# Patient Record
Sex: Male | Born: 1960
Health system: Southern US, Community
[De-identification: ages and names within clinical notes are randomized; demographics above are authoritative.]

## PROBLEM LIST (undated history)

## (undated) DIAGNOSIS — M545 Low back pain, unspecified: Secondary | ICD-10-CM

## (undated) DIAGNOSIS — R160 Hepatomegaly, not elsewhere classified: Secondary | ICD-10-CM

## (undated) DIAGNOSIS — Z8619 Personal history of other infectious and parasitic diseases: Secondary | ICD-10-CM

## (undated) DIAGNOSIS — K859 Acute pancreatitis without necrosis or infection, unspecified: Secondary | ICD-10-CM

## (undated) DIAGNOSIS — F4310 Post-traumatic stress disorder, unspecified: Secondary | ICD-10-CM

## (undated) DIAGNOSIS — IMO0002 Reserved for concepts with insufficient information to code with codable children: Secondary | ICD-10-CM

## (undated) DIAGNOSIS — G8929 Other chronic pain: Secondary | ICD-10-CM

## (undated) DIAGNOSIS — S32409A Unspecified fracture of unspecified acetabulum, initial encounter for closed fracture: Secondary | ICD-10-CM

## (undated) DIAGNOSIS — K219 Gastro-esophageal reflux disease without esophagitis: Secondary | ICD-10-CM

## (undated) HISTORY — PX: BACK SURGERY: SHX140

## (undated) HISTORY — DX: Low back pain: M54.5

## (undated) HISTORY — DX: Gastro-esophageal reflux disease without esophagitis: K21.9

## (undated) HISTORY — PX: CHOLECYSTECTOMY: SHX55

## (undated) HISTORY — PX: HERNIA REPAIR: SHX51

## (undated) HISTORY — DX: Low back pain, unspecified: M54.50

## (undated) HISTORY — PX: STERNUM FRACTURE SURGERY: SHX790

## (undated) HISTORY — DX: Other chronic pain: G89.29

## (undated) HISTORY — DX: Hepatomegaly, not elsewhere classified: R16.0

## (undated) HISTORY — DX: Acute pancreatitis without necrosis or infection, unspecified: K85.90

## (undated) HISTORY — PX: LAMINECTOMY: SHX219

---

## 1999-07-13 ENCOUNTER — Emergency Department (HOSPITAL_COMMUNITY): Admission: EM | Admit: 1999-07-13 | Discharge: 1999-07-13 | Payer: Self-pay

## 1999-12-28 ENCOUNTER — Emergency Department (HOSPITAL_COMMUNITY): Admission: EM | Admit: 1999-12-28 | Discharge: 1999-12-29 | Payer: Self-pay | Admitting: Emergency Medicine

## 1999-12-28 ENCOUNTER — Encounter: Payer: Self-pay | Admitting: Emergency Medicine

## 2000-10-23 ENCOUNTER — Encounter: Admission: RE | Admit: 2000-10-23 | Discharge: 2001-01-21 | Payer: Self-pay | Admitting: Anesthesiology

## 2001-01-25 ENCOUNTER — Encounter: Admission: RE | Admit: 2001-01-25 | Discharge: 2001-03-24 | Payer: Self-pay | Admitting: Anesthesiology

## 2001-04-20 ENCOUNTER — Encounter: Admission: RE | Admit: 2001-04-20 | Discharge: 2001-04-24 | Payer: Self-pay | Admitting: Anesthesiology

## 2004-01-26 ENCOUNTER — Ambulatory Visit (HOSPITAL_COMMUNITY): Admission: RE | Admit: 2004-01-26 | Discharge: 2004-01-26 | Payer: Self-pay | Admitting: Anesthesiology

## 2007-04-05 ENCOUNTER — Ambulatory Visit: Payer: Self-pay | Admitting: Internal Medicine

## 2007-04-05 ENCOUNTER — Inpatient Hospital Stay (HOSPITAL_COMMUNITY): Admission: EM | Admit: 2007-04-05 | Discharge: 2007-05-01 | Payer: Self-pay | Admitting: Emergency Medicine

## 2007-04-05 ENCOUNTER — Encounter (INDEPENDENT_AMBULATORY_CARE_PROVIDER_SITE_OTHER): Payer: Self-pay | Admitting: *Deleted

## 2007-04-06 ENCOUNTER — Encounter (INDEPENDENT_AMBULATORY_CARE_PROVIDER_SITE_OTHER): Payer: Self-pay | Admitting: *Deleted

## 2007-04-08 ENCOUNTER — Encounter (INDEPENDENT_AMBULATORY_CARE_PROVIDER_SITE_OTHER): Payer: Self-pay | Admitting: *Deleted

## 2007-04-21 ENCOUNTER — Ambulatory Visit: Payer: Self-pay | Admitting: Internal Medicine

## 2007-05-19 ENCOUNTER — Encounter (INDEPENDENT_AMBULATORY_CARE_PROVIDER_SITE_OTHER): Payer: Self-pay | Admitting: *Deleted

## 2007-05-19 ENCOUNTER — Encounter: Admission: RE | Admit: 2007-05-19 | Discharge: 2007-05-19 | Payer: Self-pay | Admitting: Gastroenterology

## 2007-07-07 ENCOUNTER — Encounter (INDEPENDENT_AMBULATORY_CARE_PROVIDER_SITE_OTHER): Payer: Self-pay | Admitting: *Deleted

## 2007-08-06 ENCOUNTER — Encounter (INDEPENDENT_AMBULATORY_CARE_PROVIDER_SITE_OTHER): Payer: Self-pay | Admitting: *Deleted

## 2007-08-06 ENCOUNTER — Encounter: Admission: RE | Admit: 2007-08-06 | Discharge: 2007-08-06 | Payer: Self-pay | Admitting: Gastroenterology

## 2007-08-13 ENCOUNTER — Emergency Department (HOSPITAL_COMMUNITY): Admission: EM | Admit: 2007-08-13 | Discharge: 2007-08-13 | Payer: Self-pay | Admitting: *Deleted

## 2008-05-12 ENCOUNTER — Encounter (INDEPENDENT_AMBULATORY_CARE_PROVIDER_SITE_OTHER): Payer: Self-pay | Admitting: Surgery

## 2008-05-12 ENCOUNTER — Encounter (INDEPENDENT_AMBULATORY_CARE_PROVIDER_SITE_OTHER): Payer: Self-pay | Admitting: *Deleted

## 2008-05-12 ENCOUNTER — Ambulatory Visit (HOSPITAL_COMMUNITY): Admission: RE | Admit: 2008-05-12 | Discharge: 2008-05-13 | Payer: Self-pay | Admitting: Surgery

## 2009-02-08 ENCOUNTER — Ambulatory Visit: Payer: Self-pay | Admitting: Family Medicine

## 2009-02-08 DIAGNOSIS — M545 Low back pain, unspecified: Secondary | ICD-10-CM | POA: Insufficient documentation

## 2009-02-08 DIAGNOSIS — E1159 Type 2 diabetes mellitus with other circulatory complications: Secondary | ICD-10-CM

## 2009-02-08 DIAGNOSIS — R5383 Other fatigue: Secondary | ICD-10-CM

## 2009-02-08 DIAGNOSIS — R5381 Other malaise: Secondary | ICD-10-CM

## 2009-02-08 DIAGNOSIS — R609 Edema, unspecified: Secondary | ICD-10-CM

## 2009-02-08 DIAGNOSIS — I10 Essential (primary) hypertension: Secondary | ICD-10-CM

## 2009-02-08 DIAGNOSIS — R160 Hepatomegaly, not elsewhere classified: Secondary | ICD-10-CM | POA: Insufficient documentation

## 2009-02-08 DIAGNOSIS — K219 Gastro-esophageal reflux disease without esophagitis: Secondary | ICD-10-CM | POA: Insufficient documentation

## 2009-02-09 ENCOUNTER — Ambulatory Visit: Payer: Self-pay

## 2009-02-09 ENCOUNTER — Encounter: Payer: Self-pay | Admitting: Family Medicine

## 2009-02-09 LAB — CONVERTED CEMR LAB
ALT: 35 units/L (ref 0–53)
Basophils Absolute: 0 10*3/uL (ref 0.0–0.1)
Bilirubin, Direct: 0.4 mg/dL — ABNORMAL HIGH (ref 0.0–0.3)
Chloride: 98 meq/L (ref 96–112)
Creatinine, Ser: 0.6 mg/dL (ref 0.4–1.5)
Eosinophils Absolute: 0 10*3/uL (ref 0.0–0.7)
Eosinophils Relative: 0.4 % (ref 0.0–5.0)
GFR calc non Af Amer: 153.64 mL/min (ref >60)
Glucose, Bld: 172 mg/dL — ABNORMAL HIGH (ref 70–99)
HDL: 62.7 mg/dL (ref >39.00)
Hemoglobin: 14.3 g/dL (ref 13.0–17.0)
Lymphocytes Relative: 20.8 % (ref 12.0–46.0)
Lymphs Abs: 1.2 10*3/uL (ref 0.7–4.0)
Platelets: 154 10*3/uL (ref 150.0–400.0)
Total Bilirubin: 1.1 mg/dL (ref 0.3–1.2)
Triglycerides: 96 mg/dL (ref 0.0–149.0)

## 2009-02-12 ENCOUNTER — Telehealth: Payer: Self-pay | Admitting: Family Medicine

## 2009-02-22 ENCOUNTER — Ambulatory Visit: Payer: Self-pay | Admitting: Family Medicine

## 2009-02-22 DIAGNOSIS — M810 Age-related osteoporosis without current pathological fracture: Secondary | ICD-10-CM | POA: Insufficient documentation

## 2009-03-21 ENCOUNTER — Encounter: Payer: Self-pay | Admitting: Family Medicine

## 2009-05-31 ENCOUNTER — Ambulatory Visit: Payer: Self-pay | Admitting: Family Medicine

## 2009-07-04 ENCOUNTER — Ambulatory Visit: Payer: Self-pay | Admitting: Family Medicine

## 2009-07-05 LAB — CONVERTED CEMR LAB
BUN: 5 mg/dL — ABNORMAL LOW (ref 6–23)
Calcium: 8.9 mg/dL (ref 8.4–10.5)
Hgb A1c MFr Bld: 6.9 % — ABNORMAL HIGH (ref 4.6–6.5)
Potassium: 3.9 meq/L (ref 3.5–5.1)
Testosterone: 207.6 ng/dL — ABNORMAL LOW (ref 350.00–890.00)
Vitamin B-12: 478 pg/mL (ref 211–911)

## 2009-08-29 ENCOUNTER — Ambulatory Visit: Payer: Self-pay | Admitting: Family Medicine

## 2009-08-29 DIAGNOSIS — E291 Testicular hypofunction: Secondary | ICD-10-CM

## 2009-08-29 DIAGNOSIS — E559 Vitamin D deficiency, unspecified: Secondary | ICD-10-CM | POA: Insufficient documentation

## 2009-09-14 ENCOUNTER — Encounter (INDEPENDENT_AMBULATORY_CARE_PROVIDER_SITE_OTHER): Payer: Self-pay | Admitting: *Deleted

## 2009-09-14 LAB — CONVERTED CEMR LAB
ALT: 108 units/L — ABNORMAL HIGH (ref 0–53)
Albumin: 3.4 g/dL — ABNORMAL LOW (ref 3.5–5.2)
Alkaline Phosphatase: 373 units/L — ABNORMAL HIGH (ref 39–117)
Basophils Absolute: 0 10*3/uL (ref 0.0–0.1)
Basophils Relative: 0.1 % (ref 0.0–3.0)
Hemoglobin: 16.3 g/dL (ref 13.0–17.0)
INR: 1.2 — ABNORMAL HIGH (ref 0.8–1.0)
MCHC: 32 g/dL (ref 30.0–36.0)
MCV: 109.6 fL — ABNORMAL HIGH (ref 78.0–100.0)
Monocytes Absolute: 1.1 10*3/uL — ABNORMAL HIGH (ref 0.1–1.0)
Monocytes Relative: 11.2 % (ref 3.0–12.0)
RBC: 4.64 M/uL (ref 4.22–5.81)
Total Bilirubin: 1.8 mg/dL — ABNORMAL HIGH (ref 0.3–1.2)

## 2009-09-27 DIAGNOSIS — Z8719 Personal history of other diseases of the digestive system: Secondary | ICD-10-CM | POA: Insufficient documentation

## 2009-10-02 ENCOUNTER — Ambulatory Visit (HOSPITAL_COMMUNITY): Admission: RE | Admit: 2009-10-02 | Discharge: 2009-10-02 | Payer: Self-pay | Admitting: Internal Medicine

## 2009-10-02 ENCOUNTER — Ambulatory Visit: Payer: Self-pay | Admitting: Internal Medicine

## 2009-10-02 DIAGNOSIS — Z862 Personal history of diseases of the blood and blood-forming organs and certain disorders involving the immune mechanism: Secondary | ICD-10-CM | POA: Insufficient documentation

## 2009-10-02 DIAGNOSIS — Z8639 Personal history of other endocrine, nutritional and metabolic disease: Secondary | ICD-10-CM

## 2009-10-02 LAB — CONVERTED CEMR LAB
A-1 Antitrypsin, Ser: 227 mg/dL — ABNORMAL HIGH (ref 83–200)
HCV Ab: NEGATIVE
Hepatitis B Surface Ag: NEGATIVE
INR: 1.2 — ABNORMAL HIGH (ref 0.8–1.0)
Transferrin: 204.3 mg/dL — ABNORMAL LOW (ref 212.0–360.0)

## 2009-10-22 ENCOUNTER — Telehealth (INDEPENDENT_AMBULATORY_CARE_PROVIDER_SITE_OTHER): Payer: Self-pay | Admitting: *Deleted

## 2010-02-06 ENCOUNTER — Telehealth: Payer: Self-pay | Admitting: Family Medicine

## 2010-02-22 ENCOUNTER — Telehealth: Payer: Self-pay | Admitting: Family Medicine

## 2010-02-26 ENCOUNTER — Telehealth: Payer: Self-pay | Admitting: Family Medicine

## 2010-04-26 ENCOUNTER — Telehealth: Payer: Self-pay | Admitting: Family Medicine

## 2010-05-01 ENCOUNTER — Ambulatory Visit: Payer: Self-pay | Admitting: Family Medicine

## 2010-05-01 DIAGNOSIS — R17 Unspecified jaundice: Secondary | ICD-10-CM | POA: Insufficient documentation

## 2010-05-06 LAB — CONVERTED CEMR LAB
AST: 112 units/L — ABNORMAL HIGH (ref 0–37)
Albumin: 3.5 g/dL (ref 3.5–5.2)
BUN: 8 mg/dL (ref 6–23)
Basophils Absolute: 0.3 10*3/uL — ABNORMAL HIGH (ref 0.0–0.1)
CO2: 27 meq/L (ref 19–32)
Creatinine, Ser: 0.5 mg/dL (ref 0.4–1.5)
Glucose, Bld: 95 mg/dL (ref 70–99)
Hemoglobin: 15.1 g/dL (ref 13.0–17.0)
INR: 1.3 — ABNORMAL HIGH (ref 0.8–1.0)
Neutrophils Relative %: 66.3 % (ref 43.0–77.0)
Potassium: 3.3 meq/L — ABNORMAL LOW (ref 3.5–5.1)
Prothrombin Time: 14.1 s — ABNORMAL HIGH (ref 9.7–11.8)
RDW: 15.5 % — ABNORMAL HIGH (ref 11.5–14.6)
Sodium: 139 meq/L (ref 135–145)
Total Bilirubin: 7.6 mg/dL — ABNORMAL HIGH (ref 0.3–1.2)
Total Protein: 7.3 g/dL (ref 6.0–8.3)
WBC: 12.7 10*3/uL — ABNORMAL HIGH (ref 4.5–10.5)

## 2010-05-09 ENCOUNTER — Telehealth: Payer: Self-pay | Admitting: Family Medicine

## 2010-05-13 ENCOUNTER — Telehealth: Payer: Self-pay | Admitting: Family Medicine

## 2010-05-14 ENCOUNTER — Telehealth: Payer: Self-pay | Admitting: Internal Medicine

## 2010-06-12 ENCOUNTER — Ambulatory Visit: Payer: Self-pay | Admitting: Internal Medicine

## 2010-06-12 DIAGNOSIS — K7030 Alcoholic cirrhosis of liver without ascites: Secondary | ICD-10-CM | POA: Insufficient documentation

## 2010-06-12 DIAGNOSIS — K7010 Alcoholic hepatitis without ascites: Secondary | ICD-10-CM

## 2010-06-13 ENCOUNTER — Ambulatory Visit: Payer: Self-pay | Admitting: Internal Medicine

## 2010-06-13 LAB — CONVERTED CEMR LAB
Alkaline Phosphatase: 279 units/L — ABNORMAL HIGH (ref 39–117)
Calcium: 8.6 mg/dL (ref 8.4–10.5)
Chloride: 100 meq/L (ref 96–112)
Eosinophils Absolute: 0.1 10*3/uL (ref 0.0–0.7)
Eosinophils Relative: 1.2 % (ref 0.0–5.0)
Glucose, Bld: 271 mg/dL — ABNORMAL HIGH (ref 70–99)
INR: 1.3 — ABNORMAL HIGH (ref 0.8–1.0)
Lymphocytes Relative: 20.9 % (ref 12.0–46.0)
Lymphs Abs: 1.7 10*3/uL (ref 0.7–4.0)
MCHC: 34.8 g/dL (ref 30.0–36.0)
MCV: 104.4 fL — ABNORMAL HIGH (ref 78.0–100.0)
Monocytes Absolute: 0.6 10*3/uL (ref 0.1–1.0)
Platelets: 119 10*3/uL — ABNORMAL LOW (ref 150.0–400.0)
RDW: 14.1 % (ref 11.5–14.6)
Total Bilirubin: 2.9 mg/dL — ABNORMAL HIGH (ref 0.3–1.2)

## 2010-07-30 ENCOUNTER — Encounter: Payer: Self-pay | Admitting: Family Medicine

## 2010-08-09 ENCOUNTER — Emergency Department (HOSPITAL_COMMUNITY)
Admission: EM | Admit: 2010-08-09 | Discharge: 2010-08-10 | Payer: Self-pay | Source: Home / Self Care | Admitting: Emergency Medicine

## 2010-08-10 ENCOUNTER — Inpatient Hospital Stay (HOSPITAL_COMMUNITY)
Admission: EM | Admit: 2010-08-10 | Discharge: 2010-08-13 | Payer: Self-pay | Attending: Psychiatry | Admitting: Psychiatry

## 2010-08-12 ENCOUNTER — Ambulatory Visit: Payer: Self-pay | Admitting: Family Medicine

## 2010-09-26 NOTE — Progress Notes (Signed)
Summary: need new rx Onetouch test Strips  Phone Note Refill Request Call back at Home Phone [phone] Message from:  spouse--walk in on 90 day supply needed*******  Refills Requested: Medication #1:  ONETOUCH ULTRA TEST  STRP test 4 times a day send to [facility]---[address]----[phone]---phone  Initial call taken by: Warnell Forester,  February 22, 2010 2:54 PM    Prescriptions: [place] TEST  STRP (GLUCOSE BLOOD) test 4 times a day  #100 Each x 10   Entered by:   Sid Falcon LPN   Authorized by:   Evelena Peat MD   Signed by:   Sid Falcon LPN on 86/57/8469   Method used:   Electronically to        [facility] [phone]* (retail)       [address]       [place], [place]  [address]       Ph: [phone] or [phone]       Fax: [phone]   RxID:   [id number]   Appended Document: need new rx Onetouch test Strips    Prescriptions: Letta Pate ULTRA TEST  STRP (GLUCOSE BLOOD) test 4 times a day  #100 Each x 10   Entered by:   Sid Falcon LPN   Authorized by:   Evelena Peat MD   Signed by:   Sid Falcon LPN on 01/60/1093   Method used:   Electronically to        [facility] [phone]* (retail)       [address]       [place], [place]  [address]       Ph: [phone] or [phone]       Fax: [phone]   RxID:   [id number]   Resent Rx to pharmacy pt requested Sid Falcon LPN  February 28, 1828 4:41 PM

## 2010-09-26 NOTE — Assessment & Plan Note (Signed)
Summary: CONCERNS WITH YELLOWING OF EYES // RS   Vital Signs:  Patient profile:   50 year old male Weight:      163 pounds Temp:     98.2 degrees F oral BP sitting:   112 / 82  (left arm) Cuff size:   regular  Vitals Entered By: Sid Falcon LPN (May 01, 2010 4:24 PM)  History of Present Illness: Patient seen with chief complaint of "yellowing of the eyes" about 10 days ago.  He has history of known hepatitis probably related to alcohol. He states he has been totally abstinent for the past 2 months. Had GI workup but did not go back for followup.   He has not been seen here or by any physician in several months now. He relates 10 days ago scleral icterus. No change in color of urine or stools. Denies any nausea, vomiting, diarrhea, abdominal pain, fever, or chills. No abdominal distention.  Patient had extensive lab evaluation per GI several months ago. Infectious hepatitis ruled out. mildly elevated alpha one antitrypsin otherwise fairly unremarkable labs.  prior cholecystectomy Had ultrasound as part of that eval noted for fatty liver changes.  Patient has some chronic back pain and states he's been taking up to 6 Tylenol per day and frequent up to 6 Advil per day. Other medications are viewed.  Diabetes which has not been closely monitored. Fasting blood sugars  130-160. Taking Lantus 10 units daily and NovoLog 5 units prior to lunch and supper but inconsistent. No symptoms of hyper or hypoglycemia.  Allergies (verified): No Known Drug Allergies  Past History:  Past Medical History: Last updated: 10/02/2009 Diabetes type 1 Hypertension GERD Migraines Chronic low back pain Hx gallstone  ? osteoporosis Pancreatitis, hx of severe (August 2008) Vitamin D Deficiency Hepatomegaly  Past Surgical History: Last updated: 10/02/2009 Cholecystectomy September 18,2009 L5 laminectomy ? date  Family History: Last updated: 02/08/2009 Family history breaast cancer,  grandmother Family history prostate cancer, grandfather  Social History: Last updated: 02/08/2009 Occupation:  out of work 6/10 Married Current Smoker Alcohol use-yes  daily wine use   Risk Factors: Exercise: no (08/29/2009)  Risk Factors: Smoking Status: current (02/08/2009) PMH-FH-SH reviewed for relevance  Review of Systems  The patient denies anorexia, fever, weight loss, chest pain, syncope, dyspnea on exertion, peripheral edema, prolonged cough, headaches, hemoptysis, abdominal pain, melena, hematochezia, severe indigestion/heartburn, and muscle weakness.    Physical Exam  General:  patient is alert cooperative and in no distress. Some obvious scleral icterus and jaundice involving the head neck and upper extremities Head:  Normocephalic and atraumatic without obvious abnormalities. No apparent alopecia or balding. Eyes:  scleral icterus  pupils equal, pupils round, and pupils reactive to light.   Ears:  External ear exam shows no significant lesions or deformities.  Otoscopic examination reveals clear canals, tympanic membranes are intact bilaterally without bulging, retraction, inflammation or discharge. Hearing is grossly normal bilaterally. Mouth:  Oral mucosa and oropharynx without lesions or exudates.  Teeth in good repair. Neck:  No deformities, masses, or tenderness noted. Lungs:  Normal respiratory effort, chest expands symmetrically. Lungs are clear to auscultation, no crackles or wheezes. Heart:  normal rate and regular rhythm.   Abdomen:  soft and nontender. Liver is palpable 10-11 cm below the right costal margin. No obvious ascites. No other masses noted Extremities:  no signif edema. Neurologic:  alert & oriented X3, cranial nerves II-XII intact, strength normal in all extremities, and gait normal.   Skin:  jaundice noted esp face, neck , and upper trunk. Cervical Nodes:  No lymphadenopathy noted   Impression & Recommendations:  Problem # 1:  LIVER  FUNCTION TESTS, ABNORMAL, HX OF (ICD-V12.2) ?related to alcohol hepatitis.  Pt denies any ETOH for 6 months.  Stressed importance of regular follow up with GI . He is cautioned about overuse of meds including OTC meds such as Tylenol. Orders: TLB-Hepatic/Liver Function Pnl (80076-HEPATIC) TLB-PT (Protime) (85610-PTP)  Problem # 2:  JAUNDICE (ICD-782.4) Assessment: New  Orders: TLB-Hepatic/Liver Function Pnl (80076-HEPATIC)  Problem # 3:  DIABETES MELLITUS, TYPE I (ICD-250.01)  His updated medication list for this problem includes:    Ramipril 10 Mg Caps (Ramipril) ..... Once daily    Lantus Solostar 100 Unit/ml Soln (Insulin glargine) ..... Use as directed    Novolog Penfill 100 Unit/ml Soln (Insulin aspart) .Marland KitchenMarland KitchenMarland KitchenMarland Kitchen 5 units [place] prior to lunch and supper  Orders: TLB-BMP (Basic Metabolic Panel-BMET) (80048-METABOL) TLB-A1C / Hgb A1C (Glycohemoglobin) (83036-A1C)  Complete Medication List: 1)  Hydrochlorothiazide 25 Mg Tabs (Hydrochlorothiazide) .... 1/2 tab daily 2)  Diazepam 10 Mg Tabs (Diazepam) .... Every 8 hours as needed 3)  Ramipril 10 Mg Caps (Ramipril) .... Once daily 4)  Lansoprazole 30 Mg Cpdr (Lansoprazole) .... Once daily 5)  Lidoderm 5 % Ptch (Lidocaine) .... As needed 6)  Lantus Solostar 100 Unit/ml Soln (Insulin glargine) .... Use as directed 7)  Bd Pen Needle Short U/f 31g X 8 Mm Misc (Insulin pen needle) .... Test 4 times a day 8)  Onetouch Ultra Test Strp (Glucose blood) .... Test 4 times a day 9)  Alendronate Sodium 70 Mg Tabs (Alendronate sodium) .... One by mouth q week 10)  Novolog Penfill 100 Unit/ml Soln (Insulin aspart) .... 5 units [place] prior to lunch and supper 11)  Vitamin D (ergocalciferol) 50000 Unit Caps (Ergocalciferol) .... One tab weekly 12)  Ms Contin 100 Mg Xr12h-tab (Morphine sulfate) .... Three times a day  Other Orders: TLB-CBC Platelet - w/Differential (85025-CBCD)  Patient Instructions: 1)  No more Tylenol. 2)  Avoid ANY use of  alcohol. 3)  Limit use of Advil/Motrin. 4)  Please schedule a follow-up appointment in 2 weeks.

## 2010-09-26 NOTE — Letter (Signed)
Summary: [facility]  [facility]   Imported By: Maryln Gottron 08/08/2010 10:43:07  _____________________________________________________________________  External Attachment:    Type:   Image     Comment:   External Document

## 2010-09-26 NOTE — Assessment & Plan Note (Signed)
Summary: Jaundice and elevated LFT   History of Present Illness Visit Type: consult Primary GI MD: [name] Flemings MD Primary [name]: [name] Peat, MD Requesting [name]: [name] Peat, MD Chief Complaint: elevated LFT's History of Present Illness:   50 year old white male with hypertension, insulin requiring diabetes mellitus, migraine headaches, chronic back pain for which he takes chronic narcotics, alcohol abuse, recurrent pancreatitis (biliary versus alcohol) status post cholecystectomy, and liver disease for which he was evaluated in this office February 2011. During his initial evaluation, the etiology of his liver disease was felt to be alcohol related. Multiple viral and nonviral studies were ordered to rule out other causes for liver disease. These were negative. Ultrasound revealed changes consistent with fatty liver. Patient was to followup but failed to do so. He told her nurse that it was too far to drive and he did not want to take the co-pay. He recently made contact with his primary care physician complaining of jaundice and requested to be seen. He was evaluated February 28, 2010 by Dr. [name]. Laboratories at that time were markedly abnormal. Basic metabolic panel was normal except for potassium 3.3. CBC was remarkable for white blood cell count of 12.7, MCV 113.1, platelet count 101, and hemoglobin 15.1. Liver function tests were markedly abnormal with SGOT 112, SGPT 32, alkaline phosphatase 253, total bilirubin 7.6. Protein 7.3 and albumin 3.5. Prothrombin time elevated at 14.1 seconds with an INR 1.3 he was again told to stay from all alcohol and avoid Tylenol. Patient tells me that he is here today to review his ultrasound was obtained 8 months ago. I reminded him that he did not followup by choice. I asked him about alcohol. He tells me that he has not had one drop of alcohol since December 2010. He continues to be invasive upon questioning, as he was during his previous visit. He  denies problems with ankle edema, abdominal swelling, melena, hematochezia, he reports that his urine color is light yellow in his stool brown. He denies difficulty with mentation.   GI Review of Systems      Denies abdominal pain, acid reflux, belching, bloating, chest pain, dysphagia with liquids, dysphagia with solids, heartburn, loss of appetite, nausea, vomiting, vomiting blood, weight loss, and  weight gain.      Reports jaundice.     Denies anal fissure, black tarry stools, change in bowel habit, constipation, diarrhea, diverticulosis, fecal incontinence, heme positive stool, hemorrhoids, irritable bowel syndrome, light color stool, liver problems, rectal bleeding, and  rectal pain. Preventive Screening-Counseling & Management  Alcohol-Tobacco     Smoking Status: quit      Drug Use:  no.      Current Medications (verified): 1)  Hydrochlorothiazide 25 Mg Tabs (Hydrochlorothiazide) .... 1/2 Tab Daily 2)  Diazepam 10 Mg Tabs (Diazepam) .... Every 8 Hours As Needed 3)  Ramipril 10 Mg Caps (Ramipril) .... Once Daily 4)  Lansoprazole 30 Mg Cpdr (Lansoprazole) .... Once Daily 5)  Lidoderm 5 % Ptch (Lidocaine) .... As Needed 6)  Lantus Solostar 100 Unit/ml Soln (Insulin Glargine) .... Use As Directed 7)  Bd Pen Needle Short U/f 31g X 8 Mm Misc (Insulin Pen Needle) .... Test 4 Times A Day 8)  Onetouch Ultra Test  Strp (Glucose Blood) .... Test 4 Times A Day 9)  Novolog Penfill 100 Unit/ml Soln (Insulin Aspart) .... 5 Units [place] Prior To Lunch and Supper 10)  Vitamin D (Ergocalciferol) 50000 Unit Caps (Ergocalciferol) .... One Tab Weekly 11)  Ms Contin 70  Mg Xr12h-Tab (Morphine Sulfate) .... Take 1 Tablet By Mouth Three Times A Day 12)  Motrin Ib 200 Mg Tabs (Ibuprofen) .... Take 2-3 [place] By Mouth As Needed 13)  Aspirin 81 Mg Tabs (Aspirin) .... Take 1 Tablet By Mouth 2 X Week  Allergies (verified): No Known Drug Allergies  Past History:  Past Medical History: Reviewed history  from 10/02/2009 and no changes required. Diabetes type 1 Hypertension GERD Migraines Chronic low back pain Hx gallstone  ? osteoporosis Pancreatitis, hx of severe (August 2008) Vitamin D Deficiency Hepatomegaly  Past Surgical History: Cholecystectomy September 18,2009 L5 laminectomy ? date Hernia Surgery  Family History: Family history breaast cancer, grandmother Family history prostate cancer, grandfather No FH of Colon Cancer:  Social History: Occupation:  out of work 6/10 Married, 1 girl Patient is a former smoker. Quit 05/01/10 Alcohol Use - no Illicit Drug Use - no Smoking Status:  quit Drug Use:  no  Review of Systems  The patient denies allergy/sinus, anemia, anxiety-new, arthritis/joint pain, back pain, blood in urine, breast changes/lumps, change in vision, confusion, cough, coughing up blood, depression-new, fainting, fatigue, fever, headaches-new, hearing problems, heart murmur, heart rhythm changes, itching, menstrual pain, muscle pains/cramps, night sweats, nosebleeds, pregnancy symptoms, shortness of breath, skin rash, sleeping problems, sore throat, swelling of feet/legs, swollen lymph glands, thirst - excessive , urination - excessive , urination changes/pain, urine leakage, vision changes, and voice change.    Vital Signs:  Patient profile:   50 year old male Height:      69 inches Weight:      156 pounds BMI:     23.12 Pulse rate:   88 / minute Pulse rhythm:   regular BP sitting:   120 / 84  (left arm) Cuff size:   regular  Vitals Entered By: Francee Piccolo CMA Duncan Dull) (June 12, 2010 4:21 PM)  Physical Exam  General:  Well developed, well nourished, no acute distress. Appears jaundiced. Head:  Normocephalic and atraumatic. Eyes:  PERRLA,. Mild icterus Nose:  No deformity, discharge,  or lesions. Mouth:  No deformity or lesions, dentition normal. Neck:  Supple; no masses or thyromegaly. Lungs:  Clear throughout to auscultation. Heart:   Regular rate and rhythm; no murmurs, rubs,  or bruits. Abdomen:  Soft, nontender and nondistended. No masses,  or hernias noted. Normal bowel sounds. Liver edge palpable. Nonpalpable spleen. Msk:  Symmetrical with no gross deformities. Normal posture. Pulses:  Normal pulses noted. Extremities:  No clubbing, cyanosis, edema or deformities noted. Neurologic:  Alert and  oriented x4;  grossly normal neurologically. Skin:  Intact without significant lesions or rashes. Psych:  Alert and cooperative. Normal mood and affect.   Impression & Recommendations:  Problem # 1:  ALCOHOLIC HEPATITIS (ICD-571.1) recent problems with jaundice consistent with alcoholic hepatitis. May have underlying alcoholic cirrhosis. Very concerning. Patient has demonstrated noncompliance. Reports no alcohol use in 10 months. I explained in the serious nature of his disease and the importance of complete abstinence from alcohol.  Plan: #1. Complete abstinence from alcohol #2. Laboratories today including CBC, comprehensive metabolic panel, prothrombin time, CDT, ethanol level, and hepatitis A serologies #3. Obtain outside GI records from Dr. Elnoria Howard #4. Discussed screening endoscopy for varices. Patient not interested at this time due to prior experience with previous GI procedure #5. Will need Twinrix vaccination series #6. Office followup in 4 weeks. Strict compliance emphasized  Problem # 2:  CIRRHOSIS, ALCOHOLIC (ICD-571.2) see above discussion  Other Orders: TLB-CBC Platelet - w/Differential (85025-CBCD) TLB-BMP (  Basic Metabolic Panel-BMET) (80048-METABOL) TLB-Hepatic/Liver Function Pnl (80076-HEPATIC) TLB-PT (Protime) (85610-PTP) T-Hepatitis A Antibody (29562-13086) T-CDT (carbohydrate deficient transferrin) (57846-96295) T-Alcohol (28413-24401)  Patient Instructions: 1)  Labs ordered for you to go to basement floor to have drawn. 2)  We will get records from Dr. Elnoria Howard. 3)  Do not drink alcohol. 4)  Please  schedule a follow-up appointment in 4 weeks.  5)  Copy sent to : [name] Peat, MD 6)  The medication list was reviewed and reconciled.  All changed / newly prescribed medications were explained.  A complete medication list was provided to the patient / caregiver.

## 2010-09-26 NOTE — Letter (Signed)
Summary: New Patient letter  [name] [facility]  [address] [place], [place] [address]   Phone: [phone]  Fax: [phone]       09/14/2009 MRN: [id number]  [name] [address] [place], [place]  [address]  Dear [name],  Welcome to the Gastroenterology Division at [facility].    You are scheduled to see Dr.  Marina Goodell  on 10-02-09 at 11AM on the 3rd floor at [facility], [address]  We ask that you try to arrive at our office 15 minutes prior to your appointment time to allow for check-in.  We would like you to complete the enclosed self-administered evaluation form prior to your visit and bring it with you on the day of your appointment.  We will review it with you.  Also, please bring a complete list of all your medications or, if you prefer, bring the medication bottles and we will list them.  Please bring your insurance card so that we may make a copy of it.  If your insurance requires a referral to see a specialist, please bring your referral form from your primary care physician.  Co-payments are due at the time of your visit and may be paid by cash, check or credit card.     Your office visit will consist of a consult with your physician (includes a physical exam), any laboratory testing he/she may order, scheduling of any necessary diagnostic testing (e.g. x-ray, ultrasound, CT-scan), and scheduling of a procedure (e.g. Endoscopy, Colonoscopy) if required.  Please allow enough time on your schedule to allow for any/all of these possibilities.    If you cannot keep your appointment, please call [phone] to cancel or reschedule prior to your appointment date.  This allows [place] the opportunity to schedule an appointment for another patient in need of care.  If you do not cancel or reschedule by 5 p.m. the business day prior to your appointment date, you will be charged a $50.00 late cancellation/no-show fee.    Thank you for choosing [facility] for your medical needs.  We appreciate the opportunity to care for you.  Please visit [place] at our website  to learn more about our practice.                     Sincerely,                                                             The Gastroenterology Division

## 2010-09-26 NOTE — Assessment & Plan Note (Signed)
Summary: HEPATOMEGALY   History of Present Illness Visit Type: Initial Consult Primary GI MD: [name] MD Primary Provider: Evelena Peat, MD Requesting Provider: Evelena Peat, MD Chief Complaint: hepatomegaly History of Present Illness:   50 year old white male with multiple medical problems including hypertension, insulin requiring diabetes mellitus, migraine headaches, chronic back pain for which he is on chronic narcotics, alcohol abuse, and recurrent pancreatitis (biliary versus alcohol) status post cholecystectomy. The patient is sent today regarding hepatomegaly on physical exam and abnormal liver tests. The patient states that he does not know why he is here, although it is clearly documented in the EMR. Review of outside laboratories from August 29, 2009 show marked abnormalities including SGOT of 367, SGPT 108, alkaline phosphatase 373, total bilirubin 1.8. Albumin is depressed at 3.4. Prothrombin time mildly elevated at 12.3 seconds (INR 1.2). CBC reveals a hemoglobin of 16.3. MCV markedly elevated at 109.6. Platelet count low at 129,000. The patient apparently has a significant history of alcohol abuse. He is extremely evasive during the interview. He tells me that he has had 12 glasses of wine in the past 6 months. He denies prior knowledge of liver problems or family history of liver disease. He does have a small tattoo on her right ankle. Denies prior blood transfusion. He has had some ankle swelling, particularly on the left. He denies difficulty with mentation or GI bleeding.Marland Kitchen He has been seen previously by Dr. Jeani Hawking but did not want to see him for his GI followup. We have requested outside records.   GI Review of Systems      Denies abdominal pain, acid reflux, belching, bloating, chest pain, dysphagia with liquids, dysphagia with solids, heartburn, loss of appetite, nausea, vomiting, vomiting blood, weight loss, and  weight gain.        Denies anal fissure, black  tarry stools, change in bowel habit, constipation, diarrhea, diverticulosis, fecal incontinence, heme positive stool, hemorrhoids, irritable bowel syndrome, jaundice, light color stool, liver problems, rectal bleeding, and  rectal pain.    Current Medications (verified): 1)  Hydrochlorothiazide 25 Mg Tabs (Hydrochlorothiazide) .... 1/2 Tab Daily 2)  Diazepam 10 Mg Tabs (Diazepam) .... Every 8 Hours As Needed 3)  Ramipril 10 Mg Caps (Ramipril) .... Once Daily 4)  Lansoprazole 30 Mg Cpdr (Lansoprazole) .... Once Daily 5)  Lidoderm 5 % Ptch (Lidocaine) .... As Needed 6)  Lantus Solostar 100 Unit/ml Soln (Insulin Glargine) .... Use As Directed 7)  Bd Pen Needle Short U/f 31g X 8 Mm Misc (Insulin Pen Needle) .... Test 4 Times A Day 8)  Onetouch Ultra Test  Strp (Glucose Blood) .... Test 4 Times A Day 9)  Alendronate Sodium 70 Mg Tabs (Alendronate Sodium) .... One By Mouth Q Week 10)  Novolog Penfill 100 Unit/ml Soln (Insulin Aspart) .... 5 Units Stearns Prior To Lunch and Supper 11)  Vitamin D (Ergocalciferol) 50000 Unit Caps (Ergocalciferol) .... One Tab Weekly 12)  Ms Contin 100 Mg Xr12h-Tab (Morphine Sulfate) .... Three Times A Day  Allergies (verified): No Known Drug Allergies  Past History:  Past Medical History: Diabetes type 1 Hypertension GERD Migraines Chronic low back pain Hx gallstone  ? osteoporosis Pancreatitis, hx of severe (August 2008) Vitamin D Deficiency Hepatomegaly  Past Surgical History: Cholecystectomy September 18,2009 L5 laminectomy ? date  Family History: Reviewed history from 02/08/2009 and no changes required. Family history breaast cancer, grandmother Family history prostate cancer, grandfather  Social History: Reviewed history from 02/08/2009 and no changes required. Occupation:  out of  work 6/10 Married Current Smoker Alcohol use-yes  daily wine use   Review of Systems       back pain, cough, muscle cramps, swelling of feet and ankles. All other  systems reviewed and reported as negative.  Vital Signs:  Patient profile:   50 year old male Height:      69 inches Weight:      164.25 pounds BMI:     24.34 Pulse rate:   120 / minute Pulse rhythm:   regular BP sitting:   152 / 84  (right arm) Cuff size:   regular  Vitals Entered By: June McMurray CMA Duncan Dull) (October 02, 2009 10:58 AM)  Physical Exam  General:  Well developed, well nourished, no acute distress. Head:  Normocephalic and atraumatic. Eyes:  PERRLA, no icterus. Nose:  No deformity, discharge,  or lesions. Mouth:  No deformity or lesions, Neck:  Supple; no masses or thyromegaly. Chest Wall:  Symmetrical,  no deformities . Lungs:  Clear throughout to auscultation. Heart:  Regular rate and rhythm; no murmurs, rubs,  or bruits. Abdomen:  Soft, nontender and nondistended. No masses, hepatosplenomegaly (somewhat difficult to examine as he was voluntarily guarding)or hernias noted. Normal bowel sounds. Msk:  Symmetrical with no gross deformities. Normal posture. Pulses:  Normal pulses noted. Extremities:  1+ edema bilaterally at the ankles Neurologic:  Alert and  oriented x4;  grossly normal neurologically.. No asterixis. tremor when holding hands in front of him Skin:  somewhat dusky skin hue. No obvious jaundice. Small tattoo of cross on right ankle Psych:  Alert and for the most part cooperative. Normal mood and affect.. A bit evasive on direct questioning   Impression & Recommendations:  Problem # 1:  LIVER FUNCTION TESTS, ABNORMAL, HX OF (ICD-V12.2) Abnormal liver function tests and other laboratories suggesting alcohol related injury. Probably had alcoholic hepatitis at that time he was evaluated with those lab abnormalities and hepatomegaly. I discussed with him directly the importance of complete abstinence from alcohol. He acknowledged such.  Plan: #1. Evaluate for chronic viral and nonviral causes for elevated liver tests #2. Hepatic ultrasound an  abdominal ultrasound #3. Alpha-fetoprotein #4. Absolutely no alcohol #5. Office followup in 2 weeks. Repeat standard liver tests at that time  Problem # 2:  PANCREATITIS, HX OF (ICD-V12.70) alcohol versus gallstones. Status post cholecystectomy.  Other Orders: TLB-Ferritin (82728-FER) TLB-Iron, (Fe) Total (83540-FE) TLB-PT (Protime) (85610-PTP) TLB-IBC Pnl (Iron/FE;Transferrin) (83550-IBC) T-Alpha-1-Antitrypsin Tot [phone]) T-AMA [phone]) T-ANA [phone]) T-Anti SMA (62952-84132) T-Ceruloplasmin [phone]) T-Hepatitis B Surface Antibody (66440-34742) T-Hepatitis B Surface Antigen (59563-87564) T-Hepatitis C Anti HCV (33295) T-Alpha-Fetoprotein Serum (18841-66063) Ultrasound Abdomen (UAS)  Patient Instructions: 1)  You have been scheduled for an Ultrasound today at [facility]. 2)  You will have labs drawn today at our basement lab. 3)  Please schedule a follow-up appointment in 2 weeks.  4)  The medication list was reviewed and reconciled.  All changed / newly prescribed medications were explained.  A complete medication list was provided to the patient / caregiver. 5)  copy: Dr. Evelena Peat

## 2010-09-26 NOTE — Initial Assessments (Signed)
Summary: Consultation  NAME:  [name], [name] NO.:  [id number]      MEDICAL RECORD NO.:  [id number]          PATIENT TYPE:  INP      LOCATION:  2926                         FACILITY:  [facility]      PHYSICIAN:  Jordan Hawks. Elnoria Howard, MD    DATE OF BIRTH:  March 12, 1961      DATE OF CONSULTATION:  04/06/2007   DATE OF DISCHARGE:                                    CONSULTATION      GI CONSULTATION.      REASON FOR CONSULTATION:  Pancreatitis.      PRIMARY CARE PROVIDER:  Dr. Evelena Peat.      HISTORY OF PRESENT ILLNESS:  This is a 50 year old gentleman with past   medical history of  compression fracture, hypertension and   gastroesophageal reflux disease who is admitted to the hospital with   severe epigastric pain.  The patient started to have abdominal pain   approximately 30 minutes or 1 hour after eating hamburger on Saturday.   The patient also reports having significant alcohol history where he can   drink a fifth of vodka per day and this has been ongoing for many years.   However, the alcohol use has never precipitated any epigastric pain.   The patient apparently did have this type of pain  several years ago and   he underwent ultrasound which was negative for any gallstones at that   time.  Currently during this admission, pain persisted and worsened and   became very sharp and radiating to the back.  He subsequently presented   to the hospital for further evaluation and treatment.  He was noted to   have pancreatitis on CT scan.  Additionally the patient was also found   to have hyperlipidemia.  Because of these types of findings and his   history a GI consultation was requested for further evaluation and   treatment of his current symptoms.      PAST MEDICAL AND SURGICAL HISTORY:  Is as stated above.      FAMILY HISTORY:  Noncontributory.      SOCIAL HISTORY:  This is positive for alcohol, tobacco, no illicit drug   use.      REVIEW OF SYSTEMS:  As  stated above in history of present illness,   otherwise negative.      MEDICATIONS:  Dilaudid, Folic acid, Lovenox, NovoLog, Protonix,   Benadryl, Compazine and Zofran.      ALLERGIES:  NO KNOWN DRUG ALLERGIES.      PHYSICAL EXAMINATION:  VITAL SIGNS:  Blood pressure is 132/88, heart   rate is 90, respirations 20, temperature is 98.8.   GENERAL:  The patient is in no acute distress, alert and oriented.   HEENT:  Normocephalic, atraumatic.  Extraocular muscles intact.   NECK is supple.  No lymphadenopathy.   LUNGS are clear to auscultation bilaterally.   CARDIOVASCULAR:  Regular rate and rhythm.   ABDOMEN is soft, it is tender in the epigastric region.  No rebound.   Mild rigidity.  EXTREMITIES:  No clubbing,  cyanosis or edema.      LABORATORY VALUES:  White blood cell count is 12.1, hemoglobin 13.8,   platelets at 163.  Sodium 126, potassium 2.3, chloride 191, CO2 22, BUN   is 8, creatinine 0.5, glucose at 338, AST 63, ALT 49, alk phos 195,   total bilirubin 2.2, albumin 2.3, lipase is 292.      IMPRESSION:   1. Gallstone pancreatitis.   2. Alcohol abuse.   3. Hyperlipidemia.  Triglycerides are greater than 5009.  After       evaluation of the patient my feeling is that he does have       microlithiasis that precipitated his pancreatitis.  He has a long       history of alcohol abuse and never developed pancreatitis as a       result.  At this time his transaminases are declining, however, his       total bilirubin is noted to be mildly increased.  This may be a lag       time for this value.  If his transaminases continued to increase or       it does not resolve, an ERCP will be pursued, however at this time       a surgical consultation is recommended for cholecystectomy.               Jordan Hawks Elnoria Howard, MD   Electronically Signed            PDH/MEDQ  D:  04/06/2007  T:  04/07/2007  Job:  [id number]      cc:   Evelena Peat, M.D.

## 2010-09-26 NOTE — Op Note (Signed)
Summary: Operative Report  NAME:  [name], [name]              ACCOUNT NO.:  [id number]      MEDICAL RECORD NO.:  [id number]          PATIENT TYPE:  AMB      LOCATION:  DAY                          FACILITY:  [facility]      PHYSICIAN:  Sandria Bales. Ezzard Standing, M.D.  DATE OF BIRTH:  [birth year]      DATE OF PROCEDURE:  05/12/2008   DATE OF DISCHARGE:                                  OPERATIVE REPORT      Date of Surgery ??      PREOPERATIVE DIAGNOSES:  Gallstones, history of pancreatitis.      POSTOPERATIVE DIAGNOSES:  Gallstones (numerous small bilirubinate   stones/gravel), chronic cholecystitis.      PROCEDURE:  Laparoscopic cholecystectomy with intraoperative   cholangiogram.      SURGEON:  Sandria Bales. Ezzard Standing, M.D.      FIRST ASSISTANT:  Marta Lamas. Lindie Spruce, M.D.      ANESTHESIA:  General endotracheal.      ESTIMATED BLOOD LOSS:  Minimal.      INDICATIONS FOR PROCEDURE:  [name] is a 50 year old white   male who is a patient of Evelena Peat, M.D.  He was hospitalized in   August of 2008 with pancreatitis.  He had three potential causes for   this.  He had known history of alcohol drinking.  He was also found to   have triglycerides greater than 5000 and he had gallstones.      In the intervening year, he stopped his alcohol.  He has lost a   significant amount of weight.  Though he is now diabetic, he is   controlling his triglycerides and actually I checked the preop   triglycerides on him and they were 59 with a high normal being 150.  His   cholesterol was 220 and he has the gallstones.  I discussed with him   proceeding with cholecystectomy.  I discussed the indications and   potential complications.  The potential complications include, but are   not limited to bleeding, infection, bile duct injury and the possibility   of open surgery.  He understands that because of his of pancreatitis   this is going to be unclear and we will not know what type of adhesions   or scar  tissue he has until the surgery.      OPERATIVE NOTE:  The patient was placed in the supine position, given a   general endotracheal anesthetic.  After appropriate anesthesia was   given, he was given 1 gram of Ancef at the initiation of the procedure.   A timeout was taken identifying the patient and the procedure.      We went through an infraumbilical incision with sharp dissection carried   down to the abdominal cavity.  A zero degree, 10 mm laparoscope was   inserted through a 12 mm Hasson trocar.  The Hasson trocar was secured   with a zero Vicryl suture and the abdomen explored.  The right and left   lobes were unremarkable.  He had a couple of adhesions  of omentum to his   upper abdomen.  These really were fairly unremarkable looking.  I took   these down off the gallbladder in an area consistent with chronic   cholecystitis.  His liver did not look bad, it was soft.  It may have   had a little bit of change to the texture of the liver, but I consider   it fairly minimal.  He certainly does not have obvious cirrhosis.  He   has no obvious stomach disease.  The bowel that I could see with   otherwise unremarkable.      I placed three additional trocars, a 10 mm subxiphoid trocar, a 5 mm   right mid subcostal and 5 mm lateral subcostal trocar.  I grabbed the   gallbladder.  The gallbladder was somewhat tense.  He also had adhesions   on the anterior wall of the gallbladder.  They were taken down.  I got   down to the cystic duct and gallbladder junction.  I tried to develop   the triangle of Calot.      I did shoot an intraoperative cholangiogram.  I placed a clip on the   gallbladder side of the cystic duct.  I made an incision on the side of   the cystic duct.  I then inserted a 14 gauge Jelco through the abdominal   wall and then placed a cut Taut catheter through the Jelco into the cut   cystic duct.  I did note some bilirubinate stone material that came out   of the cystic  duct after milking it while the bile coming out of the   common bile duct was clear.  I then placed the cut Taut catheter into   the cystic duct and secured it with a Hemoclip and shot intraoperative   cholangiogram using about 8-9 mL of half-strength Isopaque solution.   This showed free flow down to the common bile duct, up the hepatic   radicles, down to the duodenum where this emptied in to, but otherwise   it was fairly normal intraoperative cholangiogram with no filling   defect, mass or obstruction.  There was somewhat slow filling through   his duodenum.  This is not surprising with his history of pancreatitis.      The Taut catheter was removed.  The cystic artery was identified   posteriorly, double clipped and divided then I sharply and bluntly   dissected the gallbladder from the gallbladder bed.  Prior to completely   removing the gallbladder from the gallbladder bed, I did re-visualize   the triangle of Calot.  I re-visualized the gallbladder bed.  I saw no   bleeding and no bowel leak.  I placed the gallbladder into and EndoCatch   bag and brought it out through the umbilicus.      I then irrigated the abdomen with 2 liters of saline.  I took the trocar   out and the trocar sites looked good.  The umbilical port was closed   with zero Vicryl suture times two. The skin at each site was closed with   5-0 Vicryl and painted with tincture of benzoin and Steri-Strips.      The patient tolerated the procedure well and was transported to the   recovery room in good condition.               Sandria Bales. Ezzard Standing, M.D.   Electronically Signed  DHN/MEDQ  D:  05/12/2008  T:  05/13/2008  Job:  [id number]      cc:   Evelena Peat, M.D.      Kathrin Penner. Vear Clock, M.D.   Fax: [phone]

## 2010-09-26 NOTE — Progress Notes (Signed)
Summary: Pt called re: status of GI   Phone Note Call from Patient Call back at Home Phone [phone]   Caller: Patient Call For: Evelena Peat MD Summary of Call: Pt calls and is willing to go back to GI (Dr. Marina Goodell). [phone] Initial call taken by: Lynann Beaver CMA,  May 13, 2010 12:21 PM  Follow-up for Phone Call        Go ahead and refer. Follow-up by: Evelena Peat MD,  May 13, 2010 1:17 PM  Additional Follow-up for Phone Call Additional follow up Details #1::        Pt called back and needs to know what he needs to do about seeing GI (Dr. Marina Goodell). Pls calll back asap. Pt is req a GI appt for Wed or Thurs afternoon  of next week. Pls call pt [phone]  Additional Follow-up by: Lucy Antigua,  May 13, 2010 4:20 PM    Additional Follow-up for Phone Call Additional follow up Details #2::    Sent request to [facility] - they will send to Triage nurse to try and work in pt with an appt.  I will call pt as soon as I have appt info. Follow-up by: Corky Mull,  May 14, 2010 9:15 AM

## 2010-09-26 NOTE — Progress Notes (Signed)
Summary: Lansoprazole 30 mg refill request  Phone Note From Pharmacy   Caller: [facility]  [address] [phone]* Call For: [name]  Summary of Call: Faxed request received for Lansoprazole 30 mg, one daily.  See note from Dr Lamar Sprinkles office please. Last OV 08/29/09 Initial call taken by: Sid Falcon LPN,  February 06, 2010 4:29 PM  Follow-up for Phone Call        OK to refill times 3 but will need office follow before further refills. Follow-up by: Evelena Peat MD,  February 06, 2010 5:43 PM    Prescriptions: LANSOPRAZOLE 30 MG CPDR (LANSOPRAZOLE) once daily  #30 Capsule x 3   Entered by:   Sid Falcon LPN   Authorized by:   Evelena Peat MD   Signed by:   Sid Falcon LPN on 64/33/2951   Method used:   [facility]ronically to        [facility] [phone]* (retail)       [address]       [place], [place]  [address]       Ph: [phone] or [phone]       Fax: [phone]   RxID:   [id number]

## 2010-09-26 NOTE — Discharge Summary (Signed)
Summary: Discharge Summary   NAME:  [name], DETTMAN NO.:  [id number]      MEDICAL RECORD NO.:  [id number]          PATIENT TYPE:  INP      LOCATION:  5730                         FACILITY:  [facility]      PHYSICIAN:  Mariea Stable, MD   DATE OF BIRTH:  1961/05/11      DATE OF ADMISSION:  04/05/2007   DATE OF DISCHARGE:  05/01/2007                                  DISCHARGE SUMMARY      DISCHARGE DIAGNOSES:   1. Severe pancreatitis.   2. Anemia.   3. Hyperglycemia.   4. Hypertension.   5. Alcohol abuse.      PAST MEDICAL HISTORY:   1. Compression fracture.   2. Status post right inguinal hernia repair.   3. L5 disk surgery.      DISCHARGE MEDICATIONS:   1. OxyContin 80 mg p.o. t.i.d.   2. Oxycodone 50 mg p.o. q.4-6 hours p.r.n. break-through pain.   3. Carafate 1 gram p.o. q.6 hours until followup with Dr. Elnoria Howard.   4. Folate.   5. Thiamine.   6. Multivitamin.      DISPOSITION AND FOLLOW UP:  Patient was to follow up with Dr. Elnoria Howard from   gastroenterology on May 05, 2007.  Also, patient was to follow up   with [facility] in 2 weeks from the date of discharge   and during that visit, pain should be addressed along with tolerance of   diet.  Patient was discharged on decent amount of pain medication, which   he was on a similar regimen prior to admission, including the OxyContin   80 mg t.i.d. and oxycodone 50 mg q.4-6 hours p.r.n. breakthrough pain.   It is to be assessed whether the patient is having adequate control.   Also during this visit, patient's glucose can be addressed given   hyperglycemia during hospitalization and potential diabetes.  Also,   patient's hemoglobin could be assessed during the visit.  Patient's   hemoglobin A1c was elevated on admission at 11.6.  Therefore, the   diabetes needs to be addressed.  BMET can also be checked to assess for   potassium, since patient had hypokalemia during admission.      PROCEDURES PERFORMED:  Patient had acute abdominal series with chest on   April 05, 2007.  Impression:  1. Normal bowel gas pattern and no   evidence of free peritoneal air.  2. Mild probable chronic bronchitic   changes on chest examination and borderline cardiomegaly.      CT of the pelvis with contrast.  Impression:  1. Acute pancreatitis with   early pseudocyst formation in the head and uncinate process.  2. Milk of   calcium bile without signs of cholecystitis.  3. Diffuse fatty   infiltration of the liver.      CT of the abdomen with contrast and CT of pelvis with contrast.   Impression:  No acute pelvic findings.      Ultrasound of the abdomen was done April 06, 2007.  Impression:  1. Ill-  defined pancreas, trace amount of left upper quadrant fluid compatible   with acute pancreatitis.  Complex cyst area in the pancreatic head   compatible with a developing pseudocyst that is seen on prior CT   measuring 2.5 x 1.8 x 2.1 cm.  2. Probable milk of calcium bile   responsible for shadowing echogenicity along the dependent gallbladder   surface.  Mild gallbladder wall thickening.  This is probably reactive   in this setting.  3. Hepatic steatosis.      CT of the abdomen with contrast April 13, 2007.  Impression:  Interval   enlargement of pancreatic fluid collection as above.  CT of pelvis with   contrast April 13, 2007.  Impression:  Enlarging left retroperitoneal   pseudocyst extending down to the pelvis.      CONSULTATIONS:   1. Dr. Jeani Hawking from gastroenterology was consulted.   2. Dr. Leonie Man.      BRIEF HISTORY AND PHYSICAL:  Patient is a 50 year old male presenting   with a 24-hour history of severe epigastric abdominal pain radiating to   his back that started 1 hour after eating a hamburger.  He had   associated nausea and vomiting x1 time that was nonbloody.  No diarrhea,   no fevers, no chills.  He drinks approximately 3 drinks of Vodka per   day.   Never had pain like this in the past.  He denies drinking any more   than his baseline before his acute pain started.  He denies any history   of increased cholesterol or triglycerides.  No new sick contacts.      PHYSICAL EXAMINATION:  VITAL SIGNS:  Temperature 97.0, blood pressure   137/99, pulse 135, respiratory rate 20, oxygen saturation 95% on room   air.   GENERAL:  The patient was alert, in pain, and oriented.   HEENT:  Eyes - pupils equally round and reactive to light and   accommodation, though small.  Extraocular movements were intact.  ENT -   no oropharyngeal exudates or lesions.   NECK:  Supple without any lymphadenopathy, no masses.   RESPIRATIONS:  Clear to auscultation bilaterally.   CARDIOVASCULAR:  Patient was tachycardiac with regular rhythm.  No   murmurs, gallops or rubs.   GI:  Positive bowel sounds.   ABDOMEN:  Distended and tender to palpation globally.  Patient had   positive guarding on exam and secondary to that was unable to assess for   rebound.   EXTREMITIES:  Posterior tibial pulses 2+.   SKIN:  Without any Cullen's sign.   NEURO:  Cranial nerves II-XII intact without any focal deficits.      ADMISSION LABORATORY DATA:  Sodium 129, potassium 3.9, chloride 84,   bicarb 24, BUN 8, creatinine 1.17, glucose 499, bilirubin 1.7, alk-phos   360, AST 117, ALT 74, protein 7.0, albumin 3.5, calcium 7.8.  WBC 15.3   with ANC of 14.1, hemoglobin 13.8, MCV 98.7, RDW 14.1, platelets   190,000.  KUB as noted above.  PT 13.4, PTT 40, INR 1.0.  Urine drug   screen positive for opiates and positive for benzos.  Lipase 292.   Urinalysis - specific gravity 1.031, glucose greater than 1000, ketones   15, alcohol less than 5, LPH 423.      HOSPITAL COURSE:   1. Patient initially was worked up for pancreatitis and etiology was       sought.  Of note, patient's triglycerides were noted to  be greater       than 5000 on admission.  Patient denied any binge drinking over the        last week, although he did have a baseline ethanol intake of a few       drinks of Vodka per day.  Patient received a right upper quadrant       ultrasound to evaluate for gallstones, which was negative for       stones.  Gastroenterology was consulted early on.  Patient was       obviously made n.p.o. and pain managed with opiates.  Patient       developed hypocalcemia during the hospitalization, which was also       addressed as a complication of his pancreatitis.  Patient was put       on TNA, given his severe pancreatitis and n.p.o. status.  TNA was       administered with any fat content, which rapidly decreased his       triglycerides and normal within 2 weeks.  Of note, on the 25th,       triglycerides were 174.  There was an impressive rapid decline in       triglycerides from greater than 5000 on the 11th to 3300 on the       14th to 657 on August 15 and slowly declined at that point back to       normal.  Patient was hydrated and electrolytes repeated as needed.       Surgery was also consulted for possible need of surgery given       enlarging pseudocyst formation.  Patient was also put on broad       spectrum antibiotics given the ascites and the increased white       count and temperature.  Patient was started on Primaxin and       temperatures and white count defervesced.  Patient was eventually       started on clear liquids and advancement of diet was done as       patient tolerated without any pain. If any pain, diet was decreased       and pain medications were increased, followed by the opposite, in       order to assess patient's tolerability to diet.   2. Anemia.  Patient's anemia panel was anemia of chronic disease.       Patient required transfusion during the hospital stay, so patient       required a total of 4 units of packed red blood cells to correct.   3. Hyperglycemia.  Patient was receiving insulin in the TNA.  Glucose       was controlled during  hospitalization, but given his hemoglobin A1c       of 11.6 on admission, this must be addressed as an outpatient       basis.  Patient will likely benefit from long-acting insulin, such       as Lantus.   4. Hypertension.  Patient was eventually taken off of medications and       blood pressure remained stable, likely secondary to distress and       pain.   5. Alcohol abuse.  Patient was put on single protocol initially along       with thiamine and folate.      DISCHARGE LABORATORY DATA:  Sodium 39, potassium 3.7, chloride 103,   bicarb 28, glucose 190, BUN 9, creatinine  0.61, calcium 9.1.  WBC 8.1,   hemoglobin 9.9, platelets 367,000.               Mariea Stable, MD   Electronically Signed            MA/MEDQ  D:  07/07/2007  T:  07/08/2007  Job:  [id number]      cc:   Jordan Hawks. Elnoria Howard, MD   [facility]   Leonie Man, M.D.

## 2010-09-26 NOTE — Assessment & Plan Note (Signed)
Summary: 3 month rov/njr   Vital Signs:  Patient profile:   50 year old male Weight:      161 pounds Temp:     98.7 degrees F oral BP sitting:   130 / 82  (left arm) Cuff size:   regular  Vitals Entered By: Sid Falcon LPN (August 29, 2009 1:15 PM) CC: 3 month ROV   History of Present Illness: Patient here to discuss the following items.  Recent very low vitamin D level. Now on 50,000 units supplement once per week. Complains of extreme weakness and decreased libido. Recent low testosterone level. Does have history of prior hepatomegaly. Denies any regular alcohol use. Had compicated pancreatitis a year ago.  Diabetes well-controlled. Recent A1c  6.9%.  No recent hypoglycemia.  Diabetes Management History:      He states understanding of dietary principles and is following his diet appropriately.  No sensory loss is reported.  Self foot exams are being performed.  He is checking home blood sugars.  He says that he is not exercising regularly.        Hypoglycemic symptoms are not occurring.  No hyperglycemic symptoms are reported.        Symptoms which suggest diabetic complications include sexual dysfunction.  Dietary compliance is reported to be good.    Preventive Screening-Counseling & Management  Caffeine-Diet-Exercise     Does Patient Exercise: no  Allergies (verified): No Known Drug Allergies  Past History:  Past Medical History: Last updated: 02/22/2009 Diabetes type 1 Hypertension GERD Migraines Chronic low back pain Hx gallstone pancreatitis ? osteoporosis  Past Surgical History: Last updated: 02/08/2009 Cholecystectomy 2009 L5 laminectomy ? date  Social History: Last updated: 02/08/2009 Occupation:  out of work 6/10 Married Current Smoker Alcohol use-yes  daily wine use   Social History: Does Patient Exercise:  no  Review of Systems  The patient denies fever, weight loss, weight gain, chest pain, syncope, dyspnea on exertion, peripheral  edema, prolonged cough, headaches, hemoptysis, abdominal pain, melena, and hematochezia.    Physical Exam  General:  Well-developed,well-nourished,in no acute distress; alert,appropriate and cooperative throughout examination Mouth:  Oral mucosa and oropharynx without lesions or exudates.  Teeth in good repair. Neck:  No deformities, masses, or tenderness noted. Lungs:  Normal respiratory effort, chest expands symmetrically. Lungs are clear to auscultation, no crackles or wheezes. Heart:  Normal rate and regular rhythm. S1 and S2 normal without gallop, murmur, click, rub or other extra sounds. Abdomen:  patient has some mild ecchymosis abdominal wall from insulin injections. He has some palpable hepatomegaly but no tenderness in the right upper quadrant region. No splenomegaly. No other masses noted. Extremities:  No clubbing, cyanosis, edema, or deformity noted with normal full range of motion of all joints.     Impression & Recommendations:  Problem # 1:  DIABETES MELLITUS, TYPE I (ICD-250.01) Adequate control His updated medication list for this problem includes:    Ramipril 10 Mg Caps (Ramipril) ..... Once daily    Lantus Solostar 100 Unit/ml Soln (Insulin glargine) ..... Use as directed    Novolog Penfill 100 Unit/ml Soln (Insulin aspart) .Marland KitchenMarland KitchenMarland KitchenMarland Kitchen 5 units House prior to lunch and supper  Problem # 2:  HYPOGONADISM (ICD-257.2) pt very interested in possible treatments but at this point I have concerns about whether he is an appropriate candidate sec to hepatic issues.  Problem # 3:  VITAMIN D DEFICIENCY (ICD-268.9) recheck levels in 1-2 months  Problem # 4:  HYPERTENSION (ICD-401.9) stable. His updated  medication list for this problem includes:    Hydrochlorothiazide 25 Mg Tabs (Hydrochlorothiazide) .Marland Kitchen... 1/2 tab daily    Ramipril 10 Mg Caps (Ramipril) ..... Once daily  Problem # 5:  HEPATOMEGALY (ICD-789.1) past hx of ETOH but pt denies any  but rare use now.  I would like to get a  better idea of his liver status. I have strongly rec agaist any ETOH use. Orders: TLB-CBC Platelet - w/Differential (85025-[id number]) TLB-Hepatic/Liver Function Pnl (80076-HEPATIC) TLB-PT (Protime) (85610-PTP) Venipuncture (19147)  Complete Medication List: 1)  Hydrochlorothiazide 25 Mg Tabs (Hydrochlorothiazide) .... 1/2 tab daily 2)  Diazepam 10 Mg Tabs (Diazepam) .... Every 8 hours as needed 3)  Ramipril 10 Mg Caps (Ramipril) .... Once daily 4)  Lansoprazole 30 Mg Cpdr (Lansoprazole) .... Once daily 5)  Lidoderm 5 % Ptch (Lidocaine) .... As needed 6)  Lantus Solostar 100 Unit/ml Soln (Insulin glargine) .... Use as directed 7)  Bd Pen Needle Short U/f 31g X 8 Mm Misc (Insulin pen needle) .... Test 4 times a day 8)  Onetouch Ultra Test Strp (Glucose blood) .... Test 4 times a day 9)  Alendronate Sodium 70 Mg Tabs (Alendronate sodium) .... One by mouth q week 10)  Novolog Penfill 100 Unit/ml Soln (Insulin aspart) .... 5 units [place] prior to lunch and supper 11)  Vitamin D (ergocalciferol) 50000 Unit Caps (Ergocalciferol) .... One tab weekly  Patient Instructions: 1)  we will call you regarding lab results and we can then make decision regarding testosterone therapy and whether you're a candidate. 2)  Please schedule a follow-up appointment in 2 months.

## 2010-09-26 NOTE — Progress Notes (Signed)
Summary: patient wants to cancel appointment  Phone Note Outgoing Call   Summary of Call:  Pt. contacted to change time of ov on 10/24/2009. says it is a 2 hr. drive each way and a $11.91 co-pay and he wishes to cx. appt. Offered many diffferent appt. times but declined them all. Told he should follow up with Dr.Burchette for liver disease. He said well that is the same scenario regarding being too far away and having to pay a co-pay.Says if he starts feeling bad he will call for appt. Initial call taken by: Teryl Lucy RN,  October 22, 2009 11:40 AM  Follow-up for Phone Call        noted. He is quite aware that he has serious liver disease. He is also aware that medical supervision has been recommended. Follow-up by: Hilarie Fredrickson MD,  October 22, 2009 12:13 PM

## 2010-09-26 NOTE — Progress Notes (Signed)
Summary: jaundiced?  Phone Note Call from Patient Call back at [facility] Phone [phone]   Caller: Patient Call For: Evelena Peat MD Summary of Call: Pt is having "yellowing of eyes".......Marland KitchenConcerned if this is coming from his elevated liver tests.  Suggestions.? Can this wait until next week?  No change in Urine color that he notices........other than change due to vitamins  "darker".  No change in bowels,and no abdominal pain. [phone]  Initial call taken by: Lynann Beaver CMA,  April 26, 2010 4:30 PM  Follow-up for Phone Call        Almost certainly due to    jaundice.  Pt with hx of very poor compliance.  Has known liver disease. This can wait if no fever, vomiting, or any other acute sxs. Follow-up by: Evelena Peat MD,  April 26, 2010 5:21 PM  Additional Follow-up for Phone Call Additional follow up Details #1::        LMTCB  Reached a girl who siad he is on his way to office now.  Rudy Jew, RN  May 01, 2010 3:21 PM  Additional Follow-up by: Lynann Beaver CMA,  April 30, 2010 7:57 AM

## 2010-09-26 NOTE — Progress Notes (Signed)
Summary: Lansoprazole approved---effective 02-19-2010  Phone Note Outgoing Call   Summary of Call: Lansoprazole DR was approved---effective 02-19-2010 per Casimiro Needle @ [facility]---[place], [place] Initial call taken by: Warnell Forester,  February 26, 2010 3:49 PM

## 2010-09-26 NOTE — Progress Notes (Signed)
Summary: Jaundice & abnormal LFT's  Phone Note From Other Clinic   Caller: Aurther Loft @ Dr [name] [phone] Call For: Dr Marina Goodell Reason for Call: Schedule Patient Appt Summary of Call: Dr [name] requesting this pt be seen next week wed or thurs for abnormal LTS's and jaundice. Initial call taken by: Leanor Kail [facility],  May 14, 2010 9:21 AM  Follow-up for Phone Call        Left message for patient to call back Darcey Nora RN, [facility]  May 14, 2010 9:38 AM  Patient is advised that Dr Marina Goodell does not have any availabilities next week.  He says he is fine waiting for his next Wed or Thursday afternoon.  Patient is scheduled for 06/12/10 3:45. Follow-up by: Darcey Nora RN, CGRN,  May 14, 2010 10:17 AM

## 2010-09-26 NOTE — Progress Notes (Signed)
Summary: Questions about GI recomendation  Phone Note Call from Patient Call back at Home Phone [phone]   Caller: Patient Call For: [name] Peat MD Summary of Call: PC from pt, he would be Ok to consider seeing GI again, would like to know exactly what they will be able to do for him.  He had a bad experience at Dr [facility] office and will not go back there. Initial call taken by: Sid Falcon LPN,  May 09, 2010 5:15 PM  Follow-up for Phone Call        I'm not sure there is much they can do but we need their expertise in managing his liver disease.  If he drinks any more ETOH there is certainly nothing anyone can do to help.  He did not see Dr Elnoria Howard recently.  We referred him to Dr Marina Goodell with [facility]. Follow-up by: [name] Peat MD,  May 09, 2010 9:33 PM

## 2010-11-04 LAB — ETHANOL: Alcohol, Ethyl (B): 216 mg/dL — ABNORMAL HIGH (ref 0–10)

## 2010-11-04 LAB — GLUCOSE, CAPILLARY
Glucose-Capillary: 205 mg/dL — ABNORMAL HIGH (ref 70–99)
Glucose-Capillary: 207 mg/dL — ABNORMAL HIGH (ref 70–99)
Glucose-Capillary: 210 mg/dL — ABNORMAL HIGH (ref 70–99)
Glucose-Capillary: 247 mg/dL — ABNORMAL HIGH (ref 70–99)
Glucose-Capillary: 286 mg/dL — ABNORMAL HIGH (ref 70–99)
Glucose-Capillary: 330 mg/dL — ABNORMAL HIGH (ref 70–99)
Glucose-Capillary: 333 mg/dL — ABNORMAL HIGH (ref 70–99)
Glucose-Capillary: 375 mg/dL — ABNORMAL HIGH (ref 70–99)
Glucose-Capillary: 488 mg/dL — ABNORMAL HIGH (ref 70–99)

## 2010-11-04 LAB — BASIC METABOLIC PANEL
CO2: 25 mEq/L (ref 19–32)
Calcium: 9.6 mg/dL (ref 8.4–10.5)
Creatinine, Ser: 0.79 mg/dL (ref 0.4–1.5)
GFR calc Af Amer: >60 mL/min (ref >60)
GFR calc non Af Amer: >60 mL/min (ref >60)

## 2010-11-04 LAB — RAPID URINE DRUG SCREEN, HOSP PERFORMED
Amphetamines: NOT DETECTED
Barbiturates: NOT DETECTED

## 2010-11-04 LAB — CBC
MCH: 32.8 pg (ref 26.0–34.0)
MCHC: 35.7 g/dL (ref 30.0–36.0)
Platelets: 210 10*3/uL (ref 150–400)
RBC: 5.34 MIL/uL (ref 4.22–5.81)

## 2010-12-16 ENCOUNTER — Other Ambulatory Visit: Payer: Self-pay | Admitting: Family Medicine

## 2011-01-07 NOTE — Consult Note (Signed)
NAMETALIESIN, HARTLAGE NO.:  [id number]   MEDICAL RECORD NO.:  [id number]          PATIENT TYPE:  INP   LOCATION:  2926                         FACILITY:  [facility]   PHYSICIAN:  Leonie Man, M.D.   DATE OF BIRTH:  01-14-1961   DATE OF CONSULTATION:  04/06/2007  DATE OF DISCHARGE:                                 CONSULTATION   PRIMARY CARE PHYSICIAN:  The primary care physician is unassigned.   ADMITTING PHYSICIAN:  The teaching service.   REASON FOR CONSULTATION:  Multifactorial pancreatitis.   HISTORY OF THE PRESENT ILLNESS:  This is a 45-year male patient with a  history of daily alcohol use, at times he has had up to one-fifth of  vodka per day, who presented the ER because of abdominal pain with  nausea and vomiting.  This was abrupt in onset after eating a fatty meal  consisting of a hamburger.  Lipase was found to be elevated at 292.  His  LFTs were elevated with a total bilirubin being elevated as well as  alkaline phosphatase.  His initial white count was 15,300.  CT  demonstrated an acute pancreatitis with early pseudocyst formation of  the head and uncinate process of the pancreas.  Ultrasound performed  today shows a dilated cystic duct with milky calcium bile deposits and a  mildly, dilated mildly thickened gallbladder wall.  In addition, the  patient's triglyceride level has been checked and is greater than 5,000.  GI has evaluated the patient feels that the etiology to the pancreatitis  is primarily due to microlithiasis given the fact that the patient had  abrupt onset of symptoms after eating and he has a dilated cystic duct.  He has  recommended surgical evaluation.   REVIEW OF SYSTEMS:  The review of systems is as above.   PAST MEDICAL HISTORY:  1. GERD.  2. Hypertension.  3. Alcoholism.  4. Compression fracture of the spine.   PAST SURGICAL HISTORY:  1. Lumbar-5 disk disease surgery.  2. Right inguinal hernia repair.   SOCIAL  HISTORY:  Daily alcohol and admits to three mixed drinks per day.  Tobacco one pack per day for 24 years.  He works as a Warden/ranger.  He is married.   ALLERGIES:  No known drug allergies.   MEDICATIONS:  Home medications include Prevacid, Altace, OxyContin,  aspirin, and Fosamax.  While hospitalized the patient has been given  calcium gluconate as well as phosphate for low electrolyte issues.  He  is on a Dilaudid PCA, he is on thiamine, he has had doses of morphine,  he is on Protonix IV, he has been given folic acid, he is on sliding  scale insulin, and several other as needed medications for symptoms.   PHYSICAL EXAMINATION:  GENERAL:  This is a pleasant, but somewhat  anxious male walking around the room complaining of diminishment his  abdominal pain, but increasing complaints of abdominal gas.  VITAL SIGNS:  T-max 100.3, BP in the 132s over 80s, pulse 118 and  respirations 20.  NEUROLOGIC:  The patient is alert and oriented  x3.  Moving all  extremities x4.  CHEST:  Bilateral lung sounds are clear to auscultation.  Respiratory  effort is unlabored; he is on room air.  HEART:  Cardiac;  S1-S2, tachycardiac.  No rubs, no thrills, no gallops  and no JVD; IV fluids infusing.  ABDOMEN:  The abdomen is soft and distended, and somewhat tympanitic.  Unable to appreciate any hepatosplenomegaly at this time on percussion  and palpation.  He is tender in the left upper quadrant.  EXTREMITIES:  The extremities are symmetrical in appearance without  edema, cyanosis or clubbing.   LABORATORY DATA:  Hemoglobin A-1-C is elevated at 11.6.  White count  today is down to 12,100.  Potassium is 3.3 and  sodium 126.  His calcium  is 5.6 and his phosphorus was 1.8; and, these are being corrected.  His  glucose is 338; suspect most of his electrolyte imbalance is due to  hyperglycemia.  Total bilirubin is up to 2.2, but the alkaline  phosphatase is down to 195, AST is down to 63,  and ALT  is down to 49.  Triglycerides are greater than 5,000.  LDH is 423, PTT is elevated at  40, but PT and INR are normal.  Diagnostic CT of the abdomen and pelvis,  again, demonstrates pancreatitis with early pseudocyst formation, and  the ultrasound shows milky calcium bile deposits within the gallbladder  and a dilated cystic duct, and also hepatic steatosis   IMPRESSION:  1. Multifactorial pancreatitis.  2. Hyperglycemia secondary to pancreatitis with elevated hemoglobin A-      1-C suggestive of a more chronic issue.  3. Hypertriglyceridemia.  4. Alcoholism.  5. Tobacco abuse.  6. Hypophosphatemia and hypocalcemia with associated hyponatremia      secondary to elevated glucose and other nutritional issues.   PLAN:  1. Agree with following the total bilirubin.  If he continues to      demonstrate an obstructive pattern GI plans an ERCP.  2. At this point would not proceed any gallbladder surgery until the      pancreatitis has fully resolved.  3. Recommend complete bowel rest, initially with PICC line and TNA      noting that the TNA should not have lipids in it given the      patient's hypertriglyceridemia.  Recommend the pharmacy dose the      TNA.  We potentially could start clears in 24-48 hours depending on      the patient's pain and nausea symptoms.  4. Would repeat a CT scan in 1 week or sooner if problems.  5. Would probably recheck the lipase soon and if this is normal, the      patient is not having any pain, if the white count has decreased,      if the lipase is near normal, and leukocytosis has resolved      consider advancing diet to a low-fat full liquids.  6. Once he is able to tolerate oral medications and diet would      aggressively treat hypertriglyceridemia with medications.  7. Given the fact that the patient's hemoglobin A-1-C is elevated and      he has significant hyperglycemia he may have a degree of chronic      pancreatitis, which has caused some  pancreatic dysfunction and has      caused a      pancreatic-related to diabetic situation.  He may need to be on      medications long-term  after discharge.  8. Agree with symptomatic treatment of volume depletion, electrolyte      imbalances, pain, etc.  9. No indication for surgery or antibiotics acutely at this point.      Allison L. Rennis Harding, N.P.      Leonie Man, M.D.  Electronically Signed    ALE/MEDQ  D:  04/06/2007  T:  04/08/2007  Job:  [id number]

## 2011-01-07 NOTE — Op Note (Signed)
NAME:  [name], [name] NO.:  [id number]   MEDICAL RECORD NO.:  [id number]          PATIENT TYPE:  AMB   LOCATION:  DAY                          FACILITY:  [facility]   PHYSICIAN:  Sandria Bales. Ezzard Standing, M.D.  DATE OF BIRTH:  Feb 21, 1961   DATE OF PROCEDURE:  05/12/2008  DATE OF DISCHARGE:                               OPERATIVE REPORT   Date of Surgery ??   PREOPERATIVE DIAGNOSES:  Gallstones, history of pancreatitis.   POSTOPERATIVE DIAGNOSES:  Gallstones (numerous small bilirubinate  stones/gravel), chronic cholecystitis.   PROCEDURE:  Laparoscopic cholecystectomy with intraoperative  cholangiogram.   SURGEON:  Sandria Bales. Ezzard Standing, M.D.   FIRST ASSISTANT:  Marta Lamas. Lindie Spruce, M.D.   ANESTHESIA:  General endotracheal.   ESTIMATED BLOOD LOSS:  Minimal.   INDICATIONS FOR PROCEDURE:  [name] is a 50 year old white  male who is a patient of Evelena Peat, M.D.  He was hospitalized in  August of 2008 with pancreatitis.  He had three potential causes for  this.  He had known history of alcohol drinking.  He was also found to  have triglycerides greater than 5000 and he had gallstones.   In the intervening year, he stopped his alcohol.  He has lost a  significant amount of weight.  Though he is now diabetic, he is  controlling his triglycerides and actually I checked the preop  triglycerides on him and they were 59 with a high normal being 150.  His  cholesterol was 220 and he has the gallstones.  I discussed with him  proceeding with cholecystectomy.  I discussed the indications and  potential complications.  The potential complications include, but are  not limited to bleeding, infection, bile duct injury and the possibility  of open surgery.  He understands that because of his of pancreatitis  this is going to be unclear and we will not know what type of adhesions  or scar tissue he has until the surgery.   OPERATIVE NOTE:  The patient was placed in the supine  position, given a  general endotracheal anesthetic.  After appropriate anesthesia was  given, he was given 1 gram of Ancef at the initiation of the procedure.  A timeout was taken identifying the patient and the procedure.   We went through an infraumbilical incision with sharp dissection carried  down to the abdominal cavity.  A zero degree, 10 mm laparoscope was  inserted through a 12 mm Hasson trocar.  The Hasson trocar was secured  with a zero Vicryl suture and the abdomen explored.  The right and left  lobes were unremarkable.  He had a couple of adhesions of omentum to his  upper abdomen.  These really were fairly unremarkable looking.  I took  these down off the gallbladder in an area consistent with chronic  cholecystitis.  His liver did not look bad, it was soft.  It may have  had a little bit of change to the texture of the liver, but I consider  it fairly minimal.  He certainly does not have obvious cirrhosis.  He  has no obvious  stomach disease.  The bowel that I could see with  otherwise unremarkable.   I placed three additional trocars, a 10 mm subxiphoid trocar, a 5 mm  right mid subcostal and 5 mm lateral subcostal trocar.  I grabbed the  gallbladder.  The gallbladder was somewhat tense.  He also had adhesions  on the anterior wall of the gallbladder.  They were taken down.  I got  down to the cystic duct and gallbladder junction.  I tried to develop  the triangle of Calot.   I did shoot an intraoperative cholangiogram.  I placed a clip on the  gallbladder side of the cystic duct.  I made an incision on the side of  the cystic duct.  I then inserted a 14 gauge Jelco through the abdominal  wall and then placed a cut Taut catheter through the Jelco into the cut  cystic duct.  I did note some bilirubinate stone material that came out  of the cystic duct after milking it while the bile coming out of the  common bile duct was clear.  I then placed the cut Taut catheter  into  the cystic duct and secured it with a Hemoclip and shot intraoperative  cholangiogram using about 8-9 mL of half-strength Isopaque solution.  This showed free flow down to the common bile duct, up the hepatic  radicles, down to the duodenum where this emptied in to, but otherwise  it was fairly normal intraoperative cholangiogram with no filling  defect, mass or obstruction.  There was somewhat slow filling through  his duodenum.  This is not surprising with his history of pancreatitis.   The Taut catheter was removed.  The cystic artery was identified  posteriorly, double clipped and divided then I sharply and bluntly  dissected the gallbladder from the gallbladder bed.  Prior to completely  removing the gallbladder from the gallbladder bed, I did re-visualize  the triangle of Calot.  I re-visualized the gallbladder bed.  I saw no  bleeding and no bowel leak.  I placed the gallbladder into and EndoCatch  bag and brought it out through the umbilicus.   I then irrigated the abdomen with 2 liters of saline.  I took the trocar  out and the trocar sites looked good.  The umbilical port was closed  with zero Vicryl suture times two. The skin at each site was closed with  5-0 Vicryl and painted with tincture of benzoin and Steri-Strips.   The patient tolerated the procedure well and was transported to the  recovery room in good condition.      Sandria Bales. Ezzard Standing, M.D.  Electronically Signed     DHN/MEDQ  D:  05/12/2008  T:  05/13/2008  Job:  [id number]   cc:   Evelena Peat, M.D.   Kathrin Penner. Vear Clock, M.D.  Fax: [phone]

## 2011-01-07 NOTE — Consult Note (Signed)
[name], HUBERS NO.:  [id number]   MEDICAL RECORD NO.:  [id number]          PATIENT TYPE:  INP   LOCATION:  2926                         FACILITY:  [facility]   PHYSICIAN:  Jordan Hawks. Elnoria Howard, MD    DATE OF BIRTH:  08-13-1961   DATE OF CONSULTATION:  04/06/2007  DATE OF DISCHARGE:                                 CONSULTATION   GI CONSULTATION.   REASON FOR CONSULTATION:  Pancreatitis.   PRIMARY CARE Adreana Coull:  Dr. Evelena Peat.   HISTORY OF PRESENT ILLNESS:  This is a 50 year old gentleman with past  medical history of  compression fracture, hypertension and  gastroesophageal reflux disease who is admitted to the hospital with  severe epigastric pain.  The patient started to have abdominal pain  approximately 30 minutes or 1 hour after eating hamburger on Saturday.  The patient also reports having significant alcohol history where he can  drink a fifth of vodka per day and this has been ongoing for many years.  However, the alcohol use has never precipitated any epigastric pain.  The patient apparently did have this type of pain  several years ago and  he underwent ultrasound which was negative for any gallstones at that  time.  Currently during this admission, pain persisted and worsened and  became very sharp and radiating to the back.  He subsequently presented  to the hospital for further evaluation and treatment.  He was noted to  have pancreatitis on CT scan.  Additionally the patient was also found  to have hyperlipidemia.  Because of these types of findings and his  history a GI consultation was requested for further evaluation and  treatment of his current symptoms.   PAST MEDICAL AND SURGICAL HISTORY:  Is as stated above.   FAMILY HISTORY:  Noncontributory.   SOCIAL HISTORY:  This is positive for alcohol, tobacco, no illicit drug  use.   REVIEW OF SYSTEMS:  As stated above in history of present illness,  otherwise negative.   MEDICATIONS:   Dilaudid, Folic acid, Lovenox, NovoLog, Protonix,  Benadryl, Compazine and Zofran.   ALLERGIES:  NO KNOWN DRUG ALLERGIES.   PHYSICAL EXAMINATION:  VITAL SIGNS:  Blood pressure is 132/88, heart  rate is 90, respirations 20, temperature is 98.8.  GENERAL:  The patient is in no acute distress, alert and oriented.  HEENT:  Normocephalic, atraumatic.  Extraocular muscles intact.  NECK is supple.  No lymphadenopathy.  LUNGS are clear to auscultation bilaterally.  CARDIOVASCULAR:  Regular rate and rhythm.  ABDOMEN is soft, it is tender in the epigastric region.  No rebound.  Mild rigidity.  EXTREMITIES:  No clubbing, cyanosis or edema.   LABORATORY VALUES:  White blood cell count is 12.1, hemoglobin 13.8,  platelets at 163.  Sodium 126, potassium 2.3, chloride 191, CO2 22, BUN  is 8, creatinine 0.5, glucose at 338, AST 63, ALT 49, alk phos 195,  total bilirubin 2.2, albumin 2.3, lipase is 292.   IMPRESSION:  1. Gallstone pancreatitis.  2. Alcohol abuse.  3. Hyperlipidemia.  Triglycerides are greater than 5009.  After  evaluation of the patient my feeling is that he does have      microlithiasis that precipitated his pancreatitis.  He has a long      history of alcohol abuse and never developed pancreatitis as a      result.  At this time his transaminases are declining, however, his      total bilirubin is noted to be mildly increased.  This may be a lag      time for this value.  If his transaminases continued to increase or      it does not resolve, an ERCP will be pursued, however at this time      a surgical consultation is recommended for cholecystectomy.      Jordan Hawks Elnoria Howard, MD  Electronically Signed     PDH/MEDQ  D:  04/06/2007  T:  04/07/2007  Job:  [id number]   cc:   Evelena Peat, M.D.

## 2011-01-10 NOTE — H&P (Signed)
[facility]  Patient:    [name], [name]                     MRN: [id number] Adm. Date:  [id number] Attending:  Thyra Breed CC:         Reather Littler, M.D.  Kerrin Champagne, M.D.  Teena Irani. Arlyce Dice, M.D.   History and Physical  HISTORY OF PRESENT ILLNESS:  [name] comes in for a followup evaluation of his back pain on the basis of compression fractures and sciatica.  Since his last evaluation, he has continued to do quite well on the OxyContin 20 mg three times a day with 1-2 OxyIR.  He continues with the Celebrex.  He has been in to see Dr. Lucianne Muss and he feels very positive that Dr. Lucianne Muss will be able to address his situation better.  He has been switched to Altace for his blood pressure management and continues on the aspirin, Fosamax, and Celebrex.  He notes that he has sciatica if he sits for too long, but otherwise he does fairly well overall and he rates his pain at 3/10.  PHYSICAL EXAMINATION:  VITAL SIGNS:  Blood pressure 130/73, heart rate 77, respiratory rate 18.  O2 saturation 97%.  Pain level is 3/10.  NEUROLOGIC:  He demonstrates symmetric deep tendon reflexes in the lower extremities.  He has minimal tenderness to percussion on the dorsal vertebra.  IMPRESSION: 1. Dorsal spine pain on the basis of dorsal compression fractures. 2. History of sciatica into the left lower extremity. 3. Other medical problems per Dr. Arlyce Dice.  DISPOSITION: 1. Continue on OxyContin 20 mg 1 p.o. q.8h. and Tylox for rescue pain.  He    does not need prescriptions today. 2. Follow up with me in eight weeks. 3. I encouraged him to follow up closely with Dr. Lucianne Muss for workup of his    osteopenia. DD:  01/26/01 TD:  01/26/01 Job: [id number]

## 2011-01-10 NOTE — Consult Note (Signed)
[facility]  Patient:    [name], [name]                     MRN: [id number] Proc. Date: 11/26/00 Adm. Date:  [id number] Attending:  Thyra Breed CC:         Kerrin Champagne, M.D.  Teena Irani. [name], M.D.   Consultation Report  FOLLOW-UP EVALUATION  [name] comes in for follow-up evaluation of his dorsal spine pain on the basis of compression fractures of the vertebrae.  Since his last evaluation, the patient has been doing remarkably better.  He continues to require some Tylox for breakthrough pain about nine hours after he takes his OxyContin, but overall, he feels like his life has been given back to him.  He uses Senokot regularly to avoid constipation.  He continues on Fosamax and Celebrex as well as aspirin and p.r.n. ibuprofen.  PHYSICAL EXAMINATION:  Blood pressure is 129/93.  Heart rate is 82, respiratory rates 18, O2 saturations 97%.  Pain level is 4/10.  He exhibits some tenderness to percussion over the vertebrae in the dorsal spine region. But overall, his disposition is markedly improved.  I have gotten some of his outside records which includes the records from Dr. Otelia [name].  I do not have any records from Dr. Criss [name], but I advised the patient that we may to have him evaluated by an endocrinologist for his decreased testosterone.  IMPRESSION: 1. Dorsal spine pain status post dorsal spine compression fractures. 2. Other medical problems per primary care physician, Dr. Arlyce [name].  DISPOSITION: 1. Increase OxyContin to 20 mg 1 p.o. q.8h. #90 with no refill. 2. Continue Tylox for breakthrough pain 1 p.o. q.6h. p.r.n. breakthrough pain    #60. 3. I plan to see the patient back in eight weeks.  He will stop by for    prescriptions in the interim.  He may need to see Dr. Arlyce [name] with regard to    his elevated blood pressure today.  I advised him that if I had not    received any information in the next couple of weeks, I would either see  about getting him involved with another endocrinologist or back with Dr.    Criss [name] at his request or speak with Dr. Arlyce [name] about things. DD:  11/26/00 TD:  11/26/00 Job: [id number]

## 2011-01-10 NOTE — H&P (Signed)
[facility]  Patient:    [name], [name]                     MRN: [id number] Adm. Date:  [id number] Attending:  Thyra Breed CC:         Reather Littler, M.D.  Kerrin Champagne, M.D.   History and Physical  Prince comes in for followup evaluation of his chronic back pain on the basis of history of compression fractures of the dorsal spine and sciatica.  Since his last evaluation he has been working 60-80 hour ______ and notes that his sciatica seems to be flaring up more.  He has also noted that he is having more pain in the dorsal spine region.  He attributes much of this to the activities that he is currently involved with more than anything else.  He does note that rest, light exercise, medications, as well as heat or ice will reduce his discomfort.  CURRENT MEDICATIONS:  Altace, aspirin, Fosamax, OxyContin 20 mg t.i.d., oxycodone p.r.n. which he has been taking frequently.  PHYSICAL EXAMINATION  VITAL SIGNS:  Blood pressure 153/107, heart rate 78, respiratory rate 20, O2 saturation 98%, pain level 6/10.  EXTREMITIES:  He exhibits symmetric deep tendon reflexes at the reflexes and ankles.  Straight leg raise signs are negative.  He does exhibit some tenderness through the mid spine to light percussion.  IMPRESSION: 1. Dorsal spine pain on the basis of compression fractures secondary to    osteoporosis with history of decreased testosterone for which he is    followed by Dr. Lucianne Muss. 2. Sciatica. 3. Other medical problems per Dr. Lucianne Muss.  DISPOSITION: 1. Increase OxyContin to 40 mg one p.o. b.i.d. #60 with no refill. 2. Continue with Tylox 5/500 one p.o. q.6h. p.r.n. breakthrough pain #100. 3. Follow-up with me in four weeks. DD:  03/23/01 TD:  03/23/01 Job: [id number]
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
## 2011-01-10 NOTE — H&P (Signed)
[facility]  Patient:    ESGAR, BARNICK Visit Number: [id number] MRN: [id number]          Service Type: PMG Location: [facility] Attending Physician:  Thyra Breed Dictated by:   Thyra Breed, M.D. Adm. Date:  01/25/2001 Disc. Date: 03/24/2001   CC:         Reather Littler, M.D.  Kerrin Champagne, M.D.   History and Physical  FOLLOWUP EVALUATION  HISTORY OF PRESENT ILLNESS:  The patient comes in for follow-up evaluation of his dorsal spine pain on the basis of compression fracture with underlying osteoporosis and sciatica.  Since his last evaluation he has done remarkably well on his OxyContin 40 mg 1 p.o. b.i.d.  He still requires 1-2 tablets of Percocet the mid point between the doses, but otherwise is doing well.  His other medications include Altace, aspirin, and Fosamax.  He feels very good overall.  He rates his pain at 2/10.  PHYSICAL EXAMINATION:  VITAL SIGNS:  Blood pressure 157/97, heart rate 106, respiratory rate 16, and O2 saturation 98%.  LUNGS:  Clear.  NEUROLOGIC:  He has minimal tenderness to percussion over vertebra.  IMPRESSION: 1. Dorsal spine pain on the basis of compression fractures which are healing    secondary to osteoporosis secondary to decreased testosterone for which he    is followed by Dr. Lucianne Muss. 2. Sciatica, stable. 3. Other medical problems as per Dr. Lucianne Muss.  DISPOSITION: 1. Continue on current dose of OxyContin 40 mg 1 p.o. b.i.d., #60 with no    refill. 2. Percocet 5/500 1 p.o. q.6h. p.r.n. breakthrough pain, #100 with no refill. 3. Followup with me in eight weeks.  I advised him that he would just need to    give [place] a call so we can write prescriptions for his opiates in the    interim.  He plans to follow up at [facility]. Dictated by:   Thyra Breed, M.D. Attending Physician:  Thyra Breed DD:  04/20/01 TD:  04/20/01 Job: [id number]

## 2011-01-10 NOTE — Discharge Summary (Signed)
[name], [name] NO.:  [id number]   MEDICAL RECORD NO.:  [id number]          PATIENT TYPE:  INP   LOCATION:  5730                         FACILITY:  [facility]   PHYSICIAN:  [name] Stable, MD   DATE OF BIRTH:  05-May-1961   DATE OF ADMISSION:  04/05/2007  DATE OF DISCHARGE:  05/01/2007                               DISCHARGE SUMMARY   DISCHARGE DIAGNOSES:  1. Severe pancreatitis.  2. Anemia.  3. Hyperglycemia.  4. Hypertension.  5. Alcohol abuse.   PAST MEDICAL HISTORY:  1. Compression fracture.  2. Status post right inguinal hernia repair.  3. L5 disk surgery.   DISCHARGE MEDICATIONS:  1. OxyContin 80 mg p.o. t.i.d.  2. Oxycodone 50 mg p.o. q.4-6 hours p.r.n. break-through pain.  3. Carafate 1 gram p.o. q.6 hours until followup with Dr. Elnoria [name].  4. Folate.  5. Thiamine.  6. Multivitamin.   DISPOSITION AND FOLLOW UP:  Patient was to follow up with Dr. Elnoria [name] from  gastroenterology on May 05, 2007.  Also, patient was to follow up  with [facility] in 2 weeks from the date of discharge  and during that visit, pain should be addressed along with tolerance of  diet.  Patient was discharged on decent amount of pain medication, which  he was on a similar regimen prior to admission, including the OxyContin  80 mg t.i.d. and oxycodone 50 mg q.4-6 hours p.r.n. breakthrough pain.  It is to be assessed whether the patient is having adequate control.  Also during this visit, patient's glucose can be addressed given  hyperglycemia during hospitalization and potential diabetes.  Also,  patient's hemoglobin could be assessed during the visit.  Patient's  hemoglobin A1c was elevated on admission at 11.6.  Therefore, the  diabetes needs to be addressed.  BMET can also be checked to assess for  potassium, since patient had hypokalemia during admission.   PROCEDURES PERFORMED:  Patient had acute abdominal series with chest on  April 05, 2007.   Impression:  1. Normal bowel gas pattern and no  evidence of free peritoneal air.  2. Mild probable chronic bronchitic  changes on chest examination and borderline cardiomegaly.   CT of the pelvis with contrast.  Impression:  1. Acute pancreatitis with  early pseudocyst formation in the head and uncinate process.  2. Milk of  calcium bile without signs of cholecystitis.  3. Diffuse fatty  infiltration of the liver.   CT of the abdomen with contrast and CT of pelvis with contrast.  Impression:  No acute pelvic findings.   Ultrasound of the abdomen was done April 06, 2007.  Impression:  1. Ill-  defined pancreas, trace amount of left upper quadrant fluid compatible  with acute pancreatitis.  Complex cyst area in the pancreatic head  compatible with a developing pseudocyst that is seen on prior CT  measuring 2.5 x 1.8 x 2.1 cm.  2. Probable milk of calcium bile  responsible for shadowing echogenicity along the dependent gallbladder  surface.  Mild gallbladder wall thickening.  This is probably reactive  in this setting.  3. Hepatic  steatosis.   CT of the abdomen with contrast April 13, 2007.  Impression:  Interval  enlargement of pancreatic fluid collection as above.  CT of pelvis with  contrast April 13, 2007.  Impression:  Enlarging left retroperitoneal  pseudocyst extending down to the pelvis.   CONSULTATIONS:  1. Dr. Jeani [name] from gastroenterology was consulted.  2. Dr. Leonie [name].   BRIEF HISTORY AND PHYSICAL:  Patient is a 50 year old male presenting  with a 24-hour history of severe epigastric abdominal pain radiating to  his back that started 1 hour after eating a hamburger.  He had  associated nausea and vomiting x1 time that was nonbloody.  No diarrhea,  no fevers, no chills.  He drinks approximately 3 drinks of Vodka per  day.  Never had pain like this in the past.  He denies drinking any more  than his baseline before his acute pain started.  He denies any  history  of increased cholesterol or triglycerides.  No new sick contacts.   PHYSICAL EXAMINATION:  VITAL SIGNS:  Temperature 97.0, blood pressure  137/99, pulse 135, respiratory rate 20, oxygen saturation 95% on room  air.  GENERAL:  The patient was alert, in pain, and oriented.  HEENT:  Eyes - pupils equally round and reactive to light and  accommodation, though small.  Extraocular movements were intact.  ENT -  no oropharyngeal exudates or lesions.  NECK:  Supple without any lymphadenopathy, no masses.  RESPIRATIONS:  Clear to auscultation bilaterally.  CARDIOVASCULAR:  Patient was tachycardiac with regular rhythm.  No  murmurs, gallops or rubs.  GI:  Positive bowel sounds.  ABDOMEN:  Distended and tender to palpation globally.  Patient had  positive guarding on exam and secondary to that was unable to assess for  rebound.  EXTREMITIES:  Posterior tibial pulses 2+.  SKIN:  Without any Cullen's sign.  NEURO:  Cranial nerves II-XII intact without any focal deficits.   ADMISSION LABORATORY DATA:  Sodium 129, potassium 3.9, chloride 84,  bicarb 24, BUN 8, creatinine 1.17, glucose 499, bilirubin 1.7, alk-phos  360, AST 117, ALT 74, protein 7.0, albumin 3.5, calcium 7.8.  WBC 15.3  with ANC of 14.1, hemoglobin 13.8, MCV 98.7, RDW 14.1, platelets  190,000.  KUB as noted above.  PT 13.4, PTT 40, INR 1.0.  Urine drug  screen positive for opiates and positive for benzos.  Lipase 292.  Urinalysis - specific gravity 1.031, glucose greater than 1000, ketones  15, alcohol less than 5, LPH 423.   HOSPITAL COURSE:  1. Patient initially was worked up for pancreatitis and etiology was      sought.  Of note, patient's triglycerides were noted to be greater      than 5000 on admission.  Patient denied any binge drinking over the      last week, although he did have a baseline ethanol intake of a few      drinks of Vodka per day.  Patient received a right upper quadrant      ultrasound to evaluate  for gallstones, which was negative for      stones.  Gastroenterology was consulted early on.  Patient was      obviously made n.p.o. and pain managed with opiates.  Patient      developed hypocalcemia during the hospitalization, which was also      addressed as a complication of his pancreatitis.  Patient was put      on TNA, given his severe pancreatitis  and n.p.o. status.  TNA was      administered with any fat content, which rapidly decreased his      triglycerides and normal within 2 weeks.  Of note, on the 25th,      triglycerides were 174.  There was an impressive rapid decline in      triglycerides from greater than 5000 on the 11th to 3300 on the      14th to 657 on August 15 and slowly declined at that point back to      normal.  Patient was hydrated and electrolytes repeated as needed.      Surgery was also consulted for possible need of surgery given      enlarging pseudocyst formation.  Patient was also put on broad      spectrum antibiotics given the ascites and the increased white      count and temperature.  Patient was started on Primaxin and      temperatures and white count defervesced.  Patient was eventually      started on clear liquids and advancement of diet was done as      patient tolerated without any pain. If any pain, diet was decreased      and pain medications were increased, followed by the opposite, in      order to assess patient's tolerability to diet.  2. Anemia.  Patient's anemia panel was anemia of chronic disease.      Patient required transfusion during the hospital stay, so patient      required a total of 4 units of packed red blood cells to correct.  3. Hyperglycemia.  Patient was receiving insulin in the TNA.  Glucose      was controlled during hospitalization, but given his hemoglobin A1c      of 11.6 on admission, this must be addressed as an outpatient      basis.  Patient will likely benefit from long-acting insulin, such      as Lantus.   4. Hypertension.  Patient was eventually taken off of medications and      blood pressure remained [name], likely secondary to distress and      pain.  5. Alcohol abuse.  Patient was put on single protocol initially along      with thiamine and folate.   DISCHARGE LABORATORY DATA:  Sodium 39, potassium 3.7, chloride 103,  bicarb 28, glucose 190, BUN 9, creatinine 0.61, calcium 9.1.  WBC 8.1,  hemoglobin 9.9, platelets 367,000.      [name] Stable, MD  Electronically Signed     MA/MEDQ  D:  07/07/2007  T:  07/08/2007  Job:  [id number]   cc:   Jordan Hawks. [name] Howard, MD  [facility]  [name], M.D.

## 2011-01-15 ENCOUNTER — Other Ambulatory Visit: Payer: Self-pay | Admitting: Family Medicine

## 2011-01-16 NOTE — Telephone Encounter (Signed)
Last OV 08/29/2009, we have informed him several times he needs ROV

## 2011-01-16 NOTE — Telephone Encounter (Signed)
Refill once.  Pt needs office follow up. 

## 2011-01-17 NOTE — Telephone Encounter (Signed)
Make sure this has been refilled once and pt needs office follow up.

## 2011-02-27 ENCOUNTER — Encounter: Payer: Self-pay | Admitting: Family Medicine

## 2011-02-27 ENCOUNTER — Other Ambulatory Visit: Payer: BC Managed Care – PPO

## 2011-02-28 ENCOUNTER — Other Ambulatory Visit (INDEPENDENT_AMBULATORY_CARE_PROVIDER_SITE_OTHER): Payer: BC Managed Care – PPO

## 2011-02-28 DIAGNOSIS — Z0000 Encounter for general adult medical examination without abnormal findings: Secondary | ICD-10-CM

## 2011-02-28 LAB — POCT URINALYSIS DIPSTICK
Blood, UA: NEGATIVE
Ketones, UA: NEGATIVE
Protein, UA: NEGATIVE
Spec Grav, UA: 1.015
pH, UA: 5.5

## 2011-02-28 LAB — CBC WITH DIFFERENTIAL/PLATELET
Basophils Absolute: 0 10*3/uL (ref 0.0–0.1)
HCT: 42.9 % (ref 39.0–52.0)
Hemoglobin: 14.7 g/dL (ref 13.0–17.0)
Lymphs Abs: 2.8 10*3/uL (ref 0.7–4.0)
MCHC: 34.2 g/dL (ref 30.0–36.0)
MCV: 97.2 fl (ref 78.0–100.0)
Monocytes Absolute: 0.6 10*3/uL (ref 0.1–1.0)
Neutro Abs: 3.1 10*3/uL (ref 1.4–7.7)
Platelets: 116 10*3/uL — ABNORMAL LOW (ref 150.0–400.0)
RDW: 14.4 % (ref 11.5–14.6)

## 2011-02-28 LAB — MICROALBUMIN / CREATININE URINE RATIO: Microalb Creat Ratio: 10.3 mg/g (ref 0.0–30.0)

## 2011-02-28 LAB — LIPID PANEL
Cholesterol: 139 mg/dL (ref 0–200)
HDL: 66.5 mg/dL (ref >39.00)
LDL Cholesterol: 56 mg/dL (ref 0–99)
Triglycerides: 84 mg/dL (ref 0.0–149.0)
VLDL: 16.8 mg/dL (ref 0.0–40.0)

## 2011-02-28 LAB — HEPATIC FUNCTION PANEL
ALT: 20 U/L (ref 0–53)
AST: 34 U/L (ref 0–37)
Albumin: 4.5 g/dL (ref 3.5–5.2)
Total Bilirubin: 0.9 mg/dL (ref 0.3–1.2)

## 2011-02-28 LAB — BASIC METABOLIC PANEL
BUN: 10 mg/dL (ref 6–23)
Glucose, Bld: 224 mg/dL — ABNORMAL HIGH (ref 70–99)
Potassium: 4 mEq/L (ref 3.5–5.1)

## 2011-02-28 LAB — HEMOGLOBIN A1C: Hgb A1c MFr Bld: 9.2 % — ABNORMAL HIGH (ref 4.6–6.5)

## 2011-02-28 LAB — TSH: TSH: 1.6 u[IU]/mL (ref 0.35–5.50)

## 2011-03-04 ENCOUNTER — Encounter: Payer: Self-pay | Admitting: Family Medicine

## 2011-03-04 ENCOUNTER — Ambulatory Visit (INDEPENDENT_AMBULATORY_CARE_PROVIDER_SITE_OTHER): Payer: BC Managed Care – PPO | Admitting: Family Medicine

## 2011-03-04 DIAGNOSIS — M810 Age-related osteoporosis without current pathological fracture: Secondary | ICD-10-CM

## 2011-03-04 DIAGNOSIS — Z0000 Encounter for general adult medical examination without abnormal findings: Secondary | ICD-10-CM

## 2011-03-04 DIAGNOSIS — E109 Type 1 diabetes mellitus without complications: Secondary | ICD-10-CM

## 2011-03-04 DIAGNOSIS — E559 Vitamin D deficiency, unspecified: Secondary | ICD-10-CM

## 2011-03-04 DIAGNOSIS — Z8719 Personal history of other diseases of the digestive system: Secondary | ICD-10-CM

## 2011-03-04 DIAGNOSIS — K7030 Alcoholic cirrhosis of liver without ascites: Secondary | ICD-10-CM

## 2011-03-04 DIAGNOSIS — I10 Essential (primary) hypertension: Secondary | ICD-10-CM

## 2011-03-04 NOTE — Patient Instructions (Signed)
Check on status of pneumonia and tetanus vaccines. NO ETOH. Consider titration Lantus up 2 units every 3 days until fasting is consistently under 130. Consider Vitamin D 2,000 IU per day. Continue with yearly eye exams.

## 2011-03-04 NOTE — Progress Notes (Signed)
Subjective:    Patient ID: [name], male    DOB: 04-06-1961, 50 y.o.   MRN: [id number]  HPI Patient here for complete physical and medical follow up. Past medical history reviewed. Type 1 diabetes, history of hypogonadism, vitamin D deficiency, hypertension, GERD, alcoholic hepatitis, alcoholic cirrhosis, osteoporosis, history of jaundice, and history of recurrent pancreatitis. Quit drinking with no alcohol since November of this past year. Overall feels better. Also quit smoking several months ago. Jaundice has resolved.  Progressive back issues with scheduled laminectomy L4-5 at [facility] this Friday. Diabetes treated with Lantus 15 units daily and NovoLog 5 units usually twice daily. Blood sugars between 100-200 range. No recent hypoglycemia. Eye exams through the [place].  Applying for disability. History of questionable posttraumatic stress disorder related to time in service. Followed by psychiatry at [facility]. Currently on sertraline 50 mg 2 daily. Patient also has history of some chronic neuropathy symptoms and significant disability related to his neck and low back issues. Progressive weakness and fatigue. No cognitive impairment.  Has problems with walking, bending, lifting, prolonged sitting secondary to pain.  Medications reviewed. Discontinued HCTZ recently secondary to low blood pressure. No orthostasis since then.  Past Medical History  Diagnosis Date  . Diabetes mellitus   . Hypertension   . GERD (gastroesophageal reflux disease)   . Osteoporosis   . Migraine   . Chronic lower back pain   . Vitamin D deficiency   . Hepatomegaly   . Pancreatitis    Past Surgical History  Procedure Date  . Cholecystectomy   . Hernia repair   . Laminectomy     reports that he has quit smoking. He does not have any smokeless tobacco history on file. He reports that he does not drink alcohol or use illicit drugs. family history includes Cancer in an unspecified family  member. Allergies  Allergen Reactions  . Toradol     hives      Review of Systems  Constitutional: Positive for fatigue. Negative for fever, chills, activity change and appetite change.  HENT: Negative for ear pain, sore throat and trouble swallowing.   Respiratory: Negative for cough, shortness of breath and wheezing.   Cardiovascular: Negative for chest pain, palpitations and leg swelling.  Gastrointestinal: Negative for nausea, vomiting, abdominal pain, diarrhea, constipation, blood in stool and abdominal distention.  Genitourinary: Negative for dysuria and hematuria.  Musculoskeletal: Positive for back pain.  Skin: Negative for rash.  Neurological: Negative for dizziness, seizures, syncope, weakness and headaches.  Hematological: Negative for adenopathy. Does not bruise/bleed easily.  Psychiatric/Behavioral: Negative for suicidal ideas, hallucinations, confusion and dysphoric mood.       Objective:   Physical Exam  Constitutional: He is oriented to person, place, and time.       Alert and cooperative gentleman in no distress  HENT:  Head: Normocephalic and atraumatic.  Right Ear: External ear normal.  Left Ear: External ear normal.  Mouth/Throat: Oropharynx is clear and moist. No oropharyngeal exudate.  Eyes: Pupils are equal, round, and reactive to light. No scleral icterus.  Neck: Neck supple. No thyromegaly present.  Cardiovascular: Normal rate, regular rhythm and normal heart sounds.  Exam reveals no gallop.   Pulmonary/Chest: Effort normal and breath sounds normal. No respiratory distress. He has no wheezes. He has no rales. He exhibits no tenderness.  Abdominal: Soft. Bowel sounds are normal. He exhibits no distension. There is no tenderness. There is no rebound and no guarding.  No ascites. Hepatomegaly with liver palpated several centimeters below right costal margin and nontender  Musculoskeletal: He exhibits no edema and no tenderness.  Lymphadenopathy:     He has no cervical adenopathy.  Neurological: He is alert and oriented to person, place, and time. A cranial nerve deficit is present.  Skin: No rash noted. No erythema.  Psychiatric: He has a normal mood and affect. His behavior is normal.          Assessment & Plan:  #1 past history of alcohol abuse with history of alcoholic cirrhosis and alcoholic hepatitis. Abstinent for several months and he knows seriousness of going back on alcohol. Appears to have some recovery with albumin 4.5. Still has low platelets. Liver transaminases back to normal #2 type 1 diabetes. Poor control A1c 9.2%. Titration regimen for Lantus given.  Repeat A1C in 3 months. #3 history recurrent pancreatitis #4 hypertension stable. Continue off HCTZ. Continue altace as tolerated. #5 history of vitamin D deficiency #6 reported posttraumatic stress disorder #7 osteoporosis. Discussed calcium and vitamin D and exercise. He is reluctant to look at any therapy such as bisphosphonates for fear of complications of treatment-he has read about things like osteonecrosis jaw and femur fractures. #8 history of elevated liver transaminases resolved #9  Health maintenance.  Check on pneumovax and Tdap status.  Yearly flu vaccine. Colonoscopy by next year.

## 2011-03-14 ENCOUNTER — Telehealth: Payer: Self-pay | Admitting: Family Medicine

## 2011-03-14 ENCOUNTER — Other Ambulatory Visit: Payer: Self-pay | Admitting: *Deleted

## 2011-03-14 MED ORDER — RAMIPRIL 10 MG PO CAPS: 10.0000 mg | ORAL_CAPSULE | Freq: Every day | ORAL | Status: DC

## 2011-03-14 MED ORDER — INSULIN ASPART 100 UNIT/ML ~~LOC~~ SOLN: 5.0000 [IU] | Freq: Two times a day (BID) | SUBCUTANEOUS | Status: DC

## 2011-03-14 MED ORDER — LANSOPRAZOLE 30 MG PO CPDR: 30.0000 mg | DELAYED_RELEASE_CAPSULE | Freq: Every day | ORAL | Status: DC

## 2011-03-14 MED ORDER — INSULIN GLARGINE 100 UNIT/ML ~~LOC~~ SOLN: 100.0000 [IU] | SUBCUTANEOUS | Status: DC

## 2011-03-14 NOTE — Telephone Encounter (Signed)
Pharmacy wanted to double check the dose.insulin glargine (LANTUS) 100 UNIT/ML injection [[id number]] Prescription was written for 100 is that correct?

## 2011-03-14 NOTE — Telephone Encounter (Signed)
Refills did not get done at [facility], filled today as requested

## 2011-03-14 NOTE — Telephone Encounter (Signed)
Clarification made with pharmacy, may disp 3 vials a month

## 2011-03-19 ENCOUNTER — Other Ambulatory Visit: Payer: Self-pay | Admitting: *Deleted

## 2011-03-19 NOTE — Telephone Encounter (Signed)
Changed Novolog and Lantus to pens as requested by pt, OKed a 3 month supply at [facility]

## 2011-05-15 ENCOUNTER — Ambulatory Visit: Payer: BC Managed Care – PPO | Attending: Family Medicine | Admitting: Physical Therapy

## 2011-05-15 DIAGNOSIS — IMO0001 Reserved for inherently not codable concepts without codable children: Secondary | ICD-10-CM | POA: Insufficient documentation

## 2011-05-15 DIAGNOSIS — R293 Abnormal posture: Secondary | ICD-10-CM | POA: Insufficient documentation

## 2011-05-15 DIAGNOSIS — M545 Low back pain, unspecified: Secondary | ICD-10-CM | POA: Insufficient documentation

## 2011-05-15 DIAGNOSIS — R5381 Other malaise: Secondary | ICD-10-CM | POA: Insufficient documentation

## 2011-05-20 ENCOUNTER — Encounter: Payer: Non-veteran care | Admitting: Physical Therapy

## 2011-05-22 ENCOUNTER — Ambulatory Visit: Payer: BC Managed Care – PPO | Admitting: Physical Therapy

## 2011-05-26 ENCOUNTER — Ambulatory Visit: Payer: BC Managed Care – PPO | Attending: Family Medicine | Admitting: Physical Therapy

## 2011-05-26 DIAGNOSIS — R293 Abnormal posture: Secondary | ICD-10-CM | POA: Insufficient documentation

## 2011-05-26 DIAGNOSIS — R5381 Other malaise: Secondary | ICD-10-CM | POA: Insufficient documentation

## 2011-05-26 DIAGNOSIS — M545 Low back pain, unspecified: Secondary | ICD-10-CM | POA: Insufficient documentation

## 2011-05-26 DIAGNOSIS — IMO0001 Reserved for inherently not codable concepts without codable children: Secondary | ICD-10-CM | POA: Insufficient documentation

## 2011-05-26 LAB — COMPREHENSIVE METABOLIC PANEL
Albumin: 4.3
BUN: 11
Creatinine, Ser: 0.75
Total Bilirubin: 1.1
Total Protein: 7

## 2011-05-26 LAB — DIFFERENTIAL
Basophils Absolute: 0
Lymphocytes Relative: 42
Monocytes Absolute: 0.5
Monocytes Relative: 10
Neutro Abs: 2.2
Neutrophils Relative %: 46

## 2011-05-26 LAB — GLUCOSE, CAPILLARY
Glucose-Capillary: 100 — ABNORMAL HIGH
Glucose-Capillary: 108 — ABNORMAL HIGH
Glucose-Capillary: 58 — ABNORMAL LOW

## 2011-05-26 LAB — CBC
HCT: 42
MCV: 99.5
Platelets: 138 — ABNORMAL LOW
RDW: 13.1

## 2011-05-26 LAB — TRIGLYCERIDES: Triglycerides: 59

## 2011-05-27 ENCOUNTER — Ambulatory Visit: Payer: BC Managed Care – PPO | Admitting: *Deleted

## 2011-06-02 ENCOUNTER — Encounter: Payer: Non-veteran care | Admitting: Physical Therapy

## 2011-06-04 ENCOUNTER — Ambulatory Visit: Payer: BC Managed Care – PPO | Admitting: Physical Therapy

## 2011-06-06 LAB — COMPREHENSIVE METABOLIC PANEL
ALT: 23
ALT: 25
ALT: 26
ALT: 31
ALT: 34
ALT: 36
AST: 37
AST: 36
AST: 42 — ABNORMAL HIGH
AST: 46 — ABNORMAL HIGH
AST: 59 — ABNORMAL HIGH
AST: 74 — ABNORMAL HIGH
Albumin: 2.1 — ABNORMAL LOW
Albumin: 2.2 — ABNORMAL LOW
Albumin: 1.8 — ABNORMAL LOW
Albumin: 1.8 — ABNORMAL LOW
Albumin: 1.9 — ABNORMAL LOW
Albumin: 1.9 — ABNORMAL LOW
Albumin: 2 — ABNORMAL LOW
Albumin: 2.2 — ABNORMAL LOW
Alkaline Phosphatase: 160 — ABNORMAL HIGH
Alkaline Phosphatase: 141 — ABNORMAL HIGH
Alkaline Phosphatase: 150 — ABNORMAL HIGH
Alkaline Phosphatase: 161 — ABNORMAL HIGH
Alkaline Phosphatase: 164 — ABNORMAL HIGH
Alkaline Phosphatase: 168 — ABNORMAL HIGH
Alkaline Phosphatase: 171 — ABNORMAL HIGH
Alkaline Phosphatase: 193 — ABNORMAL HIGH
BUN: 11
BUN: 11
BUN: 13
BUN: 15
BUN: 15
BUN: 16
BUN: 16
BUN: 9
CO2: 27
CO2: 24
CO2: 26
CO2: 28
CO2: 28
CO2: 29
CO2: 29
CO2: 29
CO2: 29
Calcium: 8.8
Calcium: 7.3 — ABNORMAL LOW
Calcium: 8.5
Calcium: 8.6
Calcium: 8.6
Calcium: 8.7
Calcium: 8.8
Calcium: 8.8
Calcium: 8.9
Chloride: 102
Chloride: 100
Chloride: 100
Chloride: 101
Chloride: 102
Chloride: 98
Creatinine, Ser: 0.49
Creatinine, Ser: 0.51
Creatinine, Ser: 0.48
Creatinine, Ser: 0.55
Creatinine, Ser: 0.61
Creatinine, Ser: 0.63
Creatinine, Ser: 0.67
GFR calc Af Amer: >60
GFR calc Af Amer: >60
GFR calc Af Amer: >60
GFR calc Af Amer: >60
GFR calc Af Amer: >60
GFR calc Af Amer: >60
GFR calc Af Amer: >60
GFR calc non Af Amer: >60
GFR calc non Af Amer: >60
GFR calc non Af Amer: >60
GFR calc non Af Amer: >60
GFR calc non Af Amer: >60
GFR calc non Af Amer: >60
GFR calc non Af Amer: >60
GFR calc non Af Amer: >60
GFR calc non Af Amer: >60
Glucose, Bld: 117 — ABNORMAL HIGH
Glucose, Bld: 138 — ABNORMAL HIGH
Glucose, Bld: 347 — ABNORMAL HIGH
Glucose, Bld: 69 — ABNORMAL LOW
Glucose, Bld: 82
Glucose, Bld: 83
Glucose, Bld: 87
Glucose, Bld: 90
Potassium: 3.6
Potassium: 3.9
Potassium: 4
Potassium: 4
Potassium: 4.1
Potassium: 4.2
Potassium: 4.3
Potassium: 5.2 — ABNORMAL HIGH
Sodium: 131 — ABNORMAL LOW
Sodium: 134 — ABNORMAL LOW
Sodium: 136
Sodium: 137
Sodium: 138
Sodium: 139
Total Bilirubin: 0.4
Total Bilirubin: 0.5
Total Bilirubin: 0.6
Total Bilirubin: 0.6
Total Bilirubin: 0.6
Total Bilirubin: 1
Total Bilirubin: 2.3 — ABNORMAL HIGH
Total Protein: 5.4 — ABNORMAL LOW
Total Protein: 5.8 — ABNORMAL LOW
Total Protein: 6
Total Protein: 6
Total Protein: 6.1
Total Protein: 6.1

## 2011-06-06 LAB — BASIC METABOLIC PANEL
BUN: 11
BUN: 11
BUN: 15
BUN: 2 — ABNORMAL LOW
BUN: 2 — ABNORMAL LOW
BUN: 2 — ABNORMAL LOW
BUN: 4 — ABNORMAL LOW
BUN: 7
BUN: <1 — ABNORMAL LOW
CO2: 28
CO2: 29
CO2: 30
CO2: 25
CO2: 26
CO2: 26
CO2: 26
CO2: 26
CO2: 26
CO2: 27
CO2: 27
CO2: 28
CO2: 28
CO2: 29
Calcium: 9
Calcium: 6.8 — ABNORMAL LOW
Calcium: 7.9 — ABNORMAL LOW
Calcium: 8.3 — ABNORMAL LOW
Calcium: 8.4
Calcium: 8.4
Calcium: 8.5
Calcium: 8.5
Calcium: 8.6
Calcium: 8.7
Calcium: 8.7
Calcium: 8.8
Calcium: 8.9
Chloride: 103
Chloride: 104
Chloride: 107
Chloride: 108
Chloride: 100
Chloride: 101
Chloride: 103
Chloride: 106
Chloride: 98
Chloride: 99
Chloride: 99
Creatinine, Ser: 0.52
Creatinine, Ser: 0.53
Creatinine, Ser: 0.61
Creatinine, Ser: 0.45
Creatinine, Ser: 0.49
Creatinine, Ser: 0.5
Creatinine, Ser: 0.51
Creatinine, Ser: 0.53
Creatinine, Ser: 0.53
Creatinine, Ser: 0.54
Creatinine, Ser: 0.58
Creatinine, Ser: 0.64
GFR calc Af Amer: >60
GFR calc Af Amer: >60
GFR calc Af Amer: >60
GFR calc Af Amer: >60
GFR calc Af Amer: >60
GFR calc Af Amer: >60
GFR calc Af Amer: >60
GFR calc Af Amer: >60
GFR calc Af Amer: >60
GFR calc Af Amer: >60
GFR calc Af Amer: >60
GFR calc Af Amer: >60
GFR calc Af Amer: >60
GFR calc Af Amer: >60
GFR calc non Af Amer: >60
GFR calc non Af Amer: >60
GFR calc non Af Amer: >60
GFR calc non Af Amer: >60
GFR calc non Af Amer: >60
GFR calc non Af Amer: >60
GFR calc non Af Amer: >60
GFR calc non Af Amer: >60
GFR calc non Af Amer: >60
GFR calc non Af Amer: >60
GFR calc non Af Amer: >60
GFR calc non Af Amer: >60
Glucose, Bld: 179 — ABNORMAL HIGH
Glucose, Bld: 100 — ABNORMAL HIGH
Glucose, Bld: 108 — ABNORMAL HIGH
Glucose, Bld: 114 — ABNORMAL HIGH
Glucose, Bld: 116 — ABNORMAL HIGH
Glucose, Bld: 117 — ABNORMAL HIGH
Glucose, Bld: 133 — ABNORMAL HIGH
Glucose, Bld: 136 — ABNORMAL HIGH
Glucose, Bld: 157 — ABNORMAL HIGH
Glucose, Bld: 162 — ABNORMAL HIGH
Glucose, Bld: 82
Glucose, Bld: 92
Glucose, Bld: 95
Potassium: 3.7
Potassium: 3.9
Potassium: 3.9
Potassium: 3.1 — ABNORMAL LOW
Potassium: 3.5
Potassium: 3.5
Potassium: 3.8
Potassium: 3.9
Potassium: 4
Potassium: 4
Potassium: 4.1
Sodium: 141
Sodium: 142
Sodium: 132 — ABNORMAL LOW
Sodium: 133 — ABNORMAL LOW
Sodium: 134 — ABNORMAL LOW
Sodium: 134 — ABNORMAL LOW
Sodium: 135
Sodium: 135
Sodium: 136
Sodium: 136
Sodium: 136
Sodium: 137
Sodium: 137
Sodium: 138

## 2011-06-06 LAB — HEPATIC FUNCTION PANEL
AST: 47 — ABNORMAL HIGH
Albumin: 2 — ABNORMAL LOW
Total Bilirubin: 0.7
Total Protein: 5.9 — ABNORMAL LOW

## 2011-06-06 LAB — CBC
HCT: 25.6 — ABNORMAL LOW
HCT: 25.6 — ABNORMAL LOW
HCT: 26 — ABNORMAL LOW
HCT: 26.8 — ABNORMAL LOW
HCT: 28.9 — ABNORMAL LOW
HCT: 23.5 — ABNORMAL LOW
HCT: 24 — ABNORMAL LOW
HCT: 25 — ABNORMAL LOW
HCT: 26.2 — ABNORMAL LOW
HCT: 28.5 — ABNORMAL LOW
HCT: 29.1 — ABNORMAL LOW
HCT: 29.2 — ABNORMAL LOW
HCT: 29.2 — ABNORMAL LOW
HCT: 29.5 — ABNORMAL LOW
Hemoglobin: 8.8 — ABNORMAL LOW
Hemoglobin: 8.9 — ABNORMAL LOW
Hemoglobin: 9.1 — ABNORMAL LOW
Hemoglobin: 9.9 — ABNORMAL LOW
Hemoglobin: 10.1 — ABNORMAL LOW
Hemoglobin: 10.2 — ABNORMAL LOW
Hemoglobin: 10.7 — ABNORMAL LOW
Hemoglobin: 7 — CL
Hemoglobin: 7.4 — CL
Hemoglobin: 7.7 — CL
Hemoglobin: 7.9 — CL
Hemoglobin: 8.1 — ABNORMAL LOW
Hemoglobin: 8.2 — ABNORMAL LOW
Hemoglobin: 8.5 — ABNORMAL LOW
Hemoglobin: 8.8 — ABNORMAL LOW
Hemoglobin: 8.9 — ABNORMAL LOW
Hemoglobin: 9 — ABNORMAL LOW
Hemoglobin: 9.4 — ABNORMAL LOW
Hemoglobin: 9.7 — ABNORMAL LOW
Hemoglobin: 9.8 — ABNORMAL LOW
MCHC: 33.8
MCHC: 34
MCHC: 34.2
MCHC: 34.4
MCHC: 31.9
MCHC: 32.9
MCHC: 32.9
MCHC: 34
MCHC: 34.1
MCHC: 34.1
MCHC: 34.1
MCHC: 34.2
MCHC: 34.2
MCHC: 34.3
MCHC: 34.4
MCHC: 34.5
MCHC: 34.9
MCHC: 34.9
MCHC: 35.4
MCV: 87.9
MCV: 89.6
MCV: 89.8
MCV: 90.4
MCV: 90.8
MCV: 102.9 — ABNORMAL HIGH
MCV: 91.8
MCV: 92.5
MCV: 93.8
MCV: 94.4
MCV: 95.3
MCV: 95.8
MCV: 96.3
MCV: 97
MCV: 97.7
MCV: 97.8
Platelets: 364
Platelets: 372
Platelets: 100 — ABNORMAL LOW
Platelets: 147 — ABNORMAL LOW
Platelets: 229
Platelets: 239
Platelets: 250
Platelets: 280
Platelets: 445 — ABNORMAL HIGH
Platelets: 448 — ABNORMAL HIGH
Platelets: 484 — ABNORMAL HIGH
Platelets: 488 — ABNORMAL HIGH
Platelets: 83 — ABNORMAL LOW
RBC: 2.85 — ABNORMAL LOW
RBC: 2.92 — ABNORMAL LOW
RBC: 2.99 — ABNORMAL LOW
RBC: 3.29 — ABNORMAL LOW
RBC: 2.18 — ABNORMAL LOW
RBC: 2.39 — ABNORMAL LOW
RBC: 2.45 — ABNORMAL LOW
RBC: 2.48 — ABNORMAL LOW
RBC: 2.65 — ABNORMAL LOW
RBC: 2.84 — ABNORMAL LOW
RBC: 2.87 — ABNORMAL LOW
RBC: 2.94 — ABNORMAL LOW
RBC: 2.95 — ABNORMAL LOW
RBC: 3 — ABNORMAL LOW
RBC: 3.06 — ABNORMAL LOW
RBC: 3.13 — ABNORMAL LOW
RBC: 3.18 — ABNORMAL LOW
RBC: 3.19 — ABNORMAL LOW
RBC: 3.21 — ABNORMAL LOW
RBC: 3.38 — ABNORMAL LOW
RDW: 14.4 — ABNORMAL HIGH
RDW: 13.9
RDW: 13.9
RDW: 14
RDW: 14.1 — ABNORMAL HIGH
RDW: 14.2 — ABNORMAL HIGH
RDW: 14.3 — ABNORMAL HIGH
RDW: 14.3 — ABNORMAL HIGH
RDW: 14.4 — ABNORMAL HIGH
RDW: 14.4 — ABNORMAL HIGH
RDW: 14.5 — ABNORMAL HIGH
RDW: 14.6 — ABNORMAL HIGH
RDW: 14.7 — ABNORMAL HIGH
RDW: 15.9 — ABNORMAL HIGH
WBC: 5.8
WBC: 6
WBC: 8.1
WBC: 11.7 — ABNORMAL HIGH
WBC: 15.1 — ABNORMAL HIGH
WBC: 16.5 — ABNORMAL HIGH
WBC: 17.1 — ABNORMAL HIGH
WBC: 17.4 — ABNORMAL HIGH
WBC: 18.2 — ABNORMAL HIGH
WBC: 6.2
WBC: 6.2
WBC: 7.2

## 2011-06-06 LAB — DIFFERENTIAL
Basophils Absolute: 0
Basophils Absolute: 0
Basophils Relative: 0
Basophils Relative: 0
Eosinophils Absolute: 0.2
Eosinophils Relative: 1
Lymphocytes Relative: 34
Lymphocytes Relative: 14
Lymphocytes Relative: 8 — ABNORMAL LOW
Lymphs Abs: 2.1
Monocytes Absolute: 1.3 — ABNORMAL HIGH
Monocytes Relative: 13 — ABNORMAL HIGH
Monocytes Relative: 9
Neutro Abs: 2.8
Neutro Abs: 11.3 — ABNORMAL HIGH
Neutro Abs: 12.7 — ABNORMAL HIGH
Neutro Abs: 14.3 — ABNORMAL HIGH
Neutrophils Relative %: 50
Neutrophils Relative %: 82 — ABNORMAL HIGH
Neutrophils Relative %: 83 — ABNORMAL HIGH

## 2011-06-06 LAB — CROSSMATCH: Antibody Screen: NEGATIVE

## 2011-06-06 LAB — TRIGLYCERIDES
Triglycerides: 141
Triglycerides: 174 — ABNORMAL HIGH
Triglycerides: 317 — ABNORMAL HIGH
Triglycerides: 657 — ABNORMAL HIGH

## 2011-06-06 LAB — MAGNESIUM
Magnesium: 1.7
Magnesium: 1.5
Magnesium: 2
Magnesium: 2
Magnesium: 2.2
Magnesium: 2.2

## 2011-06-06 LAB — PREALBUMIN
Prealbumin: 4.3 — ABNORMAL LOW
Prealbumin: 4.5 — ABNORMAL LOW

## 2011-06-06 LAB — URINALYSIS, MICROSCOPIC ONLY
Glucose, UA: NEGATIVE
Ketones, ur: NEGATIVE
Leukocytes, UA: NEGATIVE
Protein, ur: NEGATIVE
Urobilinogen, UA: 1

## 2011-06-06 LAB — CHOLESTEROL, TOTAL
Cholesterol: 136
Cholesterol: 427 — ABNORMAL HIGH

## 2011-06-06 LAB — PHOSPHORUS
Phosphorus: 3.2
Phosphorus: 4.4
Phosphorus: 4.1
Phosphorus: 4.4
Phosphorus: 4.7 — ABNORMAL HIGH
Phosphorus: 4.9 — ABNORMAL HIGH
Phosphorus: 7.5 — ABNORMAL HIGH

## 2011-06-06 LAB — FOLATE: Folate: 19

## 2011-06-06 LAB — CULTURE, BLOOD (ROUTINE X 2): Culture: NO GROWTH

## 2011-06-06 LAB — IRON AND TIBC
Iron: 23 — ABNORMAL LOW
UIBC: 165

## 2011-06-06 LAB — RETICULOCYTES: RBC.: 2.95 — ABNORMAL LOW

## 2011-06-06 LAB — TRANSFUSION REACTION: DAT C3: NEGATIVE

## 2011-06-06 LAB — LIPASE, BLOOD: Lipase: 15

## 2011-06-06 LAB — FERRITIN: Ferritin: 1159 — ABNORMAL HIGH (ref 22–322)

## 2011-06-09 LAB — COMPREHENSIVE METABOLIC PANEL
ALT: 41
ALT: 49
AST: 63 — ABNORMAL HIGH
AST: 70 — ABNORMAL HIGH
AST: 97 — ABNORMAL HIGH
Albumin: 2.3 — ABNORMAL LOW
Albumin: 2.4 — ABNORMAL LOW
Albumin: 2.5 — ABNORMAL LOW
Alkaline Phosphatase: 167 — ABNORMAL HIGH
Alkaline Phosphatase: 171 — ABNORMAL HIGH
BUN: 8
BUN: 8
BUN: 9
CO2: 19
CO2: 24
Calcium: 5.2 — CL
Calcium: 5.5 — CL
Chloride: 100
Chloride: 84 — ABNORMAL LOW
Chloride: 91 — ABNORMAL LOW
Chloride: 99
Creatinine, Ser: 0.51
Creatinine, Ser: 0.67
Creatinine, Ser: 1.17
GFR calc Af Amer: >60
GFR calc Af Amer: >60
GFR calc non Af Amer: >60
GFR calc non Af Amer: >60
Glucose, Bld: 171 — ABNORMAL HIGH
Glucose, Bld: 216 — ABNORMAL HIGH
Glucose, Bld: 499 — ABNORMAL HIGH
Potassium: 3.3 — ABNORMAL LOW
Potassium: 3.6
Potassium: 3.9
Sodium: 126 — ABNORMAL LOW
Sodium: 130 — ABNORMAL LOW
Total Bilirubin: 1.5 — ABNORMAL HIGH
Total Bilirubin: 1.7 — ABNORMAL HIGH
Total Bilirubin: 2.2 — ABNORMAL HIGH
Total Bilirubin: 3 — ABNORMAL HIGH
Total Protein: 5.3 — ABNORMAL LOW

## 2011-06-09 LAB — CBC
HCT: 28.6 — ABNORMAL LOW
HCT: 31.5 — ABNORMAL LOW
HCT: 33.7 — ABNORMAL LOW
HCT: 39.1
Hemoglobin: 10.8 — ABNORMAL LOW
Hemoglobin: 11.8 — ABNORMAL LOW
MCHC: 34.4
MCHC: 35.1
MCHC: 35.1
MCV: 97.3
MCV: 97.9
MCV: 98.7
Platelets: 163
Platelets: 170
Platelets: 81 — ABNORMAL LOW
RBC: 2.94 — ABNORMAL LOW
RBC: 3.22 — ABNORMAL LOW
RBC: 3.96 — ABNORMAL LOW
RDW: 14
WBC: 12.1 — ABNORMAL HIGH
WBC: 15.3 — ABNORMAL HIGH
WBC: 6.8
WBC: 9.2
WBC: 9.8

## 2011-06-09 LAB — APTT: aPTT: 40 — ABNORMAL HIGH

## 2011-06-09 LAB — TYPE AND SCREEN
ABO/RH(D): O POS
ABO/RH(D): O POS
Antibody Screen: NEGATIVE

## 2011-06-09 LAB — URINALYSIS, ROUTINE W REFLEX MICROSCOPIC
Glucose, UA: >1000 — AB
Hgb urine dipstick: NEGATIVE
Leukocytes, UA: NEGATIVE
pH: 6.5

## 2011-06-09 LAB — POCT I-STAT 3, ART BLOOD GAS (G3+)
Acid-Base Excess: 2
O2 Saturation: 94
Operator id: 244801
Patient temperature: 98
pO2, Arterial: 63 — ABNORMAL LOW

## 2011-06-09 LAB — HEMOGLOBIN A1C
Hgb A1c MFr Bld: 11.6 — ABNORMAL HIGH
Mean Plasma Glucose: 336

## 2011-06-09 LAB — DIFFERENTIAL
Band Neutrophils: 41 — ABNORMAL HIGH
Basophils Absolute: 0
Basophils Relative: 1
Basophils Relative: 1
Eosinophils Absolute: 0.1
Lymphocytes Relative: 2 — ABNORMAL LOW
Lymphocytes Relative: 23
Lymphocytes Relative: 6 — ABNORMAL LOW
Lymphs Abs: 0.9
Metamyelocytes Relative: 0
Monocytes Absolute: 0.8 — ABNORMAL HIGH
Monocytes Relative: 2 — ABNORMAL LOW
Neutro Abs: 6.5
Neutrophils Relative %: 92 — ABNORMAL HIGH
Promyelocytes Absolute: 0
WBC Morphology: INCREASED

## 2011-06-09 LAB — URINE MICROSCOPIC-ADD ON

## 2011-06-09 LAB — BASIC METABOLIC PANEL
BUN: 7
CO2: 18 — ABNORMAL LOW
Calcium: 5.4 — CL
Calcium: 5.9 — CL
Chloride: 100
Creatinine, Ser: 0.66
Creatinine, Ser: 0.7
Creatinine, Ser: 0.78
GFR calc Af Amer: >60
GFR calc Af Amer: >60
GFR calc Af Amer: >60
GFR calc non Af Amer: >60
GFR calc non Af Amer: >60
Glucose, Bld: 446 — ABNORMAL HIGH
Potassium: 4.1
Sodium: 126 — ABNORMAL LOW
Sodium: 129 — ABNORMAL LOW
Sodium: 130 — ABNORMAL LOW

## 2011-06-09 LAB — PTH, INTACT AND CALCIUM
Calcium, Total (PTH): 5.5 — CL
PTH: 399.2 — ABNORMAL HIGH

## 2011-06-09 LAB — RAPID URINE DRUG SCREEN, HOSP PERFORMED
Amphetamines: NOT DETECTED
Barbiturates: NOT DETECTED
Benzodiazepines: POSITIVE — AB
Cocaine: NOT DETECTED
Opiates: POSITIVE — AB
Tetrahydrocannabinol: NOT DETECTED

## 2011-06-09 LAB — LACTATE DEHYDROGENASE: LDH: 423 — ABNORMAL HIGH

## 2011-06-09 LAB — I-STAT 8, (EC8 V) (CONVERTED LAB)
Acid-Base Excess: 6 — ABNORMAL HIGH
BUN: 7
Chloride: 96
HCT: 49
Operator id: 151321
Potassium: 4.3
pCO2, Ven: 34.1 — ABNORMAL LOW
pH, Ven: 7.53 — ABNORMAL HIGH

## 2011-06-09 LAB — PREALBUMIN: Prealbumin: 41.7

## 2011-06-09 LAB — PROTIME-INR
INR: 1
Prothrombin Time: 13.4

## 2011-06-09 LAB — TRIGLYCERIDES
Triglycerides: 3384 — ABNORMAL HIGH
Triglycerides: >5000 — ABNORMAL HIGH

## 2011-06-09 LAB — PHOSPHORUS
Phosphorus: 1.3 — ABNORMAL LOW
Phosphorus: 1.4 — ABNORMAL LOW
Phosphorus: 1.4 — ABNORMAL LOW

## 2011-06-09 LAB — AMYLASE: Amylase: 73

## 2011-06-09 LAB — MAGNESIUM: Magnesium: 2.3

## 2011-06-09 LAB — ETHANOL: Alcohol, Ethyl (B): <5

## 2011-06-09 LAB — LIPID PANEL

## 2011-12-04 ENCOUNTER — Telehealth: Payer: Self-pay | Admitting: Family Medicine

## 2011-12-04 NOTE — Telephone Encounter (Signed)
Last OV was July 2012, FYI

## 2011-12-04 NOTE — Telephone Encounter (Signed)
Pt is taking novolog 10 units 4 twices a day call [facility] [phone]. Pt needs increase in novolog rx

## 2011-12-04 NOTE — Telephone Encounter (Signed)
Please clarify dosing.  Pt needs office follow up.  Refill for one month until he can get in.

## 2011-12-05 MED ORDER — INSULIN ASPART 100 UNIT/ML ~~LOC~~ SOLN: SUBCUTANEOUS | Status: DC

## 2011-12-05 NOTE — Telephone Encounter (Signed)
Spoke with wife, confirms pt now doing 10 ml 4 times a day, informed he needs return OV within the month.  She voiced her understanding

## 2012-04-22 ENCOUNTER — Ambulatory Visit (INDEPENDENT_AMBULATORY_CARE_PROVIDER_SITE_OTHER): Payer: Non-veteran care | Admitting: Family Medicine

## 2012-04-22 ENCOUNTER — Encounter: Payer: Self-pay | Admitting: Family Medicine

## 2012-04-22 VITALS — BP 122/78 | Temp 98.3°F | Wt 166.0 lb

## 2012-04-22 DIAGNOSIS — K219 Gastro-esophageal reflux disease without esophagitis: Secondary | ICD-10-CM

## 2012-04-22 DIAGNOSIS — I10 Essential (primary) hypertension: Secondary | ICD-10-CM

## 2012-04-22 DIAGNOSIS — E109 Type 1 diabetes mellitus without complications: Secondary | ICD-10-CM

## 2012-04-22 MED ORDER — INSULIN GLARGINE 100 UNIT/ML ~~LOC~~ SOLN: SUBCUTANEOUS | Status: DC

## 2012-04-22 MED ORDER — LANSOPRAZOLE 30 MG PO CPDR: 30.0000 mg | DELAYED_RELEASE_CAPSULE | Freq: Every day | ORAL | Status: DC

## 2012-04-22 MED ORDER — INSULIN ASPART 100 UNIT/ML ~~LOC~~ SOLN: SUBCUTANEOUS | Status: DC

## 2012-04-22 MED ORDER — RAMIPRIL 10 MG PO CAPS: 10.0000 mg | ORAL_CAPSULE | Freq: Every day | ORAL | Status: DC

## 2012-04-22 NOTE — Progress Notes (Signed)
  Subjective:    Patient ID: [name], male    DOB: 1960-12-15, 51 y.o.   MRN: [id number]  HPI  Medical followup. Patient getting most of his care through the [facility]. Prior history of alcohol abuse. Had prior history of pancreatitis and has issues of osteoporosis, elevated liver transaminases, hypogonadism, hypertension, probable type 1 diabetes and chronic back pain. Most recent lab work at the [place] about 2 months ago. He does not recall A1c. Last A1c here one year ago 9.2%. He is getting insulin through our office because of desire for quick pins. Lantus 22 units twice a day and NovoLog 5 units with each meal. No recent hypoglycemia. Overall weight has improved. Liver functions have apparently improved. Abstinent from alcohol for over one year. No recent pancreatitis flare.  Chronic back pain was been very debilitating. He seen specialist at the [facility] regarding this.  Past Medical History  Diagnosis Date  . Diabetes mellitus   . Hypertension   . GERD (gastroesophageal reflux disease)   . Osteoporosis   . Migraine   . Chronic lower back pain   . Vitamin d deficiency   . Hepatomegaly   . Pancreatitis    Past Surgical History  Procedure Date  . Cholecystectomy   . Hernia repair   . Laminectomy     reports that he has quit smoking. He does not have any smokeless tobacco history on file. He reports that he does not drink alcohol or use illicit drugs. family history includes Cancer in an unspecified family member. Allergies  Allergen Reactions  . Ketorolac Tromethamine     Hives (toradol)      Review of Systems  Constitutional: Positive for fatigue. Negative for fever, chills, appetite change and unexpected weight change.  Respiratory: Negative for shortness of breath.   Cardiovascular: Negative for leg swelling.  Gastrointestinal: Negative for abdominal pain.  Genitourinary: Negative for dysuria.  Musculoskeletal: Positive for back pain.  Neurological: Negative for  dizziness.       Objective:   Physical Exam  Constitutional: He is oriented to person, place, and time. He appears well-developed and well-nourished.  HENT:  Mouth/Throat: Oropharynx is clear and moist.  Neck: Neck supple.  Cardiovascular: Normal rate and regular rhythm.   Pulmonary/Chest: Effort normal and breath sounds normal. No respiratory distress. He has no wheezes. He has no rales.  Musculoskeletal: He exhibits no edema.       Feet no lesions. Normal sensory function.  Lymphadenopathy:    He has no cervical adenopathy.  Neurological: He is alert and oriented to person, place, and time.          Assessment & Plan:  #1 history of diabetes. Requesting labs from [facility]. Refill insulin .  They are following A1C about every 3 months. #2 history of GERD. Refill Prevacid  #3 hypertension. Stable. Refill altace

## 2013-02-03 ENCOUNTER — Telehealth: Payer: Self-pay | Admitting: Family Medicine

## 2013-02-03 NOTE — Telephone Encounter (Signed)
Pt is from [place] and he is staying with his mother who is very ill.  She has no other family members near by. "If she passes, I need to be here". Pt also reports he "probably should not be here as my health is going downhill also"

## 2013-02-03 NOTE — Telephone Encounter (Signed)
Patient calling to confirm message was sent to Harriett Sine, I reassured patient that original message was sent to Harriett Sine and that she and Dr. [name] are currently seeing patients but Harriett Sine would return the call by the end of today.

## 2013-02-03 NOTE — Telephone Encounter (Signed)
I called pt and informed him Dr [name] will be out of the office this afternoon, to return tomorrow, therefore the note will not be done today.  Pt explained he is currently in [place] and cannot physically report for jury duty.  I also calle pt wife and left a message that this note will note be done today.

## 2013-02-03 NOTE — Telephone Encounter (Signed)
pls note cell  New number for husband:  [phone]

## 2013-02-03 NOTE — Telephone Encounter (Signed)
Pt called and stated that he would like a note to relieve him of jury duty. Please assist.

## 2013-02-03 NOTE — Telephone Encounter (Signed)
Wife stopped in. I let her know again that Dr. [name] is not here this afternoon and that the note will not be done today. She wanted to know if it could be done tomorrow, as they are on a deadline. I said it probably could be. She works at the [employer] down the road. She will stop here around 2pm because that's when she's on lunch. She'd like to get it at that time. Please advise. She also wants the note to say that it is also because of her husband's severe diabetic neuropathy.

## 2013-02-03 NOTE — Telephone Encounter (Signed)
Why is he requesting jury note?

## 2013-02-03 NOTE — Telephone Encounter (Signed)
Wife states she needs note today. The paper she received from the court stated they must reply within 5 days if they need to get out of jury. Pt lives in [place] and wife is coming to [place] today. Advised pt to wait on nurse call.

## 2013-02-04 NOTE — Telephone Encounter (Signed)
I don't see where this should exclude jury duty.

## 2013-02-04 NOTE — Telephone Encounter (Signed)
Pt informed, and I also left a message for pt wife on her cell phone as she is working today.  Pt will also try to get ahold of her before she comes to office.

## 2013-02-10 ENCOUNTER — Ambulatory Visit: Payer: BC Managed Care – PPO | Admitting: Family Medicine

## 2013-04-09 ENCOUNTER — Encounter (HOSPITAL_COMMUNITY): Payer: Self-pay

## 2013-04-09 ENCOUNTER — Emergency Department (HOSPITAL_COMMUNITY): Payer: BC Managed Care – PPO

## 2013-04-09 ENCOUNTER — Inpatient Hospital Stay (HOSPITAL_COMMUNITY)
Admission: EM | Admit: 2013-04-09 | Discharge: 2013-04-19 | DRG: 236 | Disposition: A | Discharge status: Home Health | Payer: BC Managed Care – PPO | Attending: Internal Medicine | Admitting: Internal Medicine

## 2013-04-09 DIAGNOSIS — W07XXXA Fall from chair, initial encounter: Secondary | ICD-10-CM | POA: Diagnosis present

## 2013-04-09 DIAGNOSIS — S32401D Unspecified fracture of right acetabulum, subsequent encounter for fracture with routine healing: Secondary | ICD-10-CM

## 2013-04-09 DIAGNOSIS — E1159 Type 2 diabetes mellitus with other circulatory complications: Secondary | ICD-10-CM | POA: Diagnosis present

## 2013-04-09 DIAGNOSIS — R52 Pain, unspecified: Secondary | ICD-10-CM

## 2013-04-09 DIAGNOSIS — Y92009 Unspecified place in unspecified non-institutional (private) residence as the place of occurrence of the external cause: Secondary | ICD-10-CM

## 2013-04-09 DIAGNOSIS — Z87891 Personal history of nicotine dependence: Secondary | ICD-10-CM

## 2013-04-09 DIAGNOSIS — Z862 Personal history of diseases of the blood and blood-forming organs and certain disorders involving the immune mechanism: Secondary | ICD-10-CM | POA: Diagnosis present

## 2013-04-09 DIAGNOSIS — Z794 Long term (current) use of insulin: Secondary | ICD-10-CM

## 2013-04-09 DIAGNOSIS — M810 Age-related osteoporosis without current pathological fracture: Secondary | ICD-10-CM

## 2013-04-09 DIAGNOSIS — IMO0002 Reserved for concepts with insufficient information to code with codable children: Secondary | ICD-10-CM | POA: Diagnosis not present

## 2013-04-09 DIAGNOSIS — R609 Edema, unspecified: Secondary | ICD-10-CM

## 2013-04-09 DIAGNOSIS — M48061 Spinal stenosis, lumbar region without neurogenic claudication: Secondary | ICD-10-CM

## 2013-04-09 DIAGNOSIS — E559 Vitamin D deficiency, unspecified: Secondary | ICD-10-CM

## 2013-04-09 DIAGNOSIS — M47817 Spondylosis without myelopathy or radiculopathy, lumbosacral region: Secondary | ICD-10-CM | POA: Diagnosis present

## 2013-04-09 DIAGNOSIS — E1149 Type 2 diabetes mellitus with other diabetic neurological complication: Secondary | ICD-10-CM

## 2013-04-09 DIAGNOSIS — K219 Gastro-esophageal reflux disease without esophagitis: Secondary | ICD-10-CM

## 2013-04-09 DIAGNOSIS — E1142 Type 2 diabetes mellitus with diabetic polyneuropathy: Secondary | ICD-10-CM | POA: Diagnosis present

## 2013-04-09 DIAGNOSIS — K7010 Alcoholic hepatitis without ascites: Secondary | ICD-10-CM | POA: Diagnosis present

## 2013-04-09 DIAGNOSIS — S32401S Unspecified fracture of right acetabulum, sequela: Secondary | ICD-10-CM

## 2013-04-09 DIAGNOSIS — G894 Chronic pain syndrome: Secondary | ICD-10-CM | POA: Diagnosis present

## 2013-04-09 DIAGNOSIS — R160 Hepatomegaly, not elsewhere classified: Secondary | ICD-10-CM

## 2013-04-09 DIAGNOSIS — S32401A Unspecified fracture of right acetabulum, initial encounter for closed fracture: Secondary | ICD-10-CM

## 2013-04-09 DIAGNOSIS — M545 Low back pain, unspecified: Secondary | ICD-10-CM

## 2013-04-09 DIAGNOSIS — R262 Difficulty in walking, not elsewhere classified: Secondary | ICD-10-CM

## 2013-04-09 DIAGNOSIS — E119 Type 2 diabetes mellitus without complications: Secondary | ICD-10-CM | POA: Diagnosis present

## 2013-04-09 DIAGNOSIS — E785 Hyperlipidemia, unspecified: Secondary | ICD-10-CM | POA: Diagnosis present

## 2013-04-09 DIAGNOSIS — E1165 Type 2 diabetes mellitus with hyperglycemia: Secondary | ICD-10-CM | POA: Diagnosis not present

## 2013-04-09 DIAGNOSIS — G43909 Migraine, unspecified, not intractable, without status migrainosus: Secondary | ICD-10-CM | POA: Diagnosis present

## 2013-04-09 DIAGNOSIS — K7030 Alcoholic cirrhosis of liver without ascites: Secondary | ICD-10-CM

## 2013-04-09 DIAGNOSIS — S32409A Unspecified fracture of unspecified acetabulum, initial encounter for closed fracture: Secondary | ICD-10-CM

## 2013-04-09 DIAGNOSIS — F1020 Alcohol dependence, uncomplicated: Secondary | ICD-10-CM | POA: Diagnosis present

## 2013-04-09 DIAGNOSIS — Z87311 Personal history of (healed) other pathological fracture: Secondary | ICD-10-CM

## 2013-04-09 DIAGNOSIS — I10 Essential (primary) hypertension: Secondary | ICD-10-CM

## 2013-04-09 HISTORY — DX: Unspecified fracture of unspecified acetabulum, initial encounter for closed fracture: S32.409A

## 2013-04-09 LAB — COMPREHENSIVE METABOLIC PANEL
Alkaline Phosphatase: 265 U/L — ABNORMAL HIGH (ref 39–117)
BUN: 9 mg/dL (ref 6–23)
Calcium: 8.9 mg/dL (ref 8.4–10.5)
Creatinine, Ser: 0.58 mg/dL (ref 0.50–1.35)
GFR calc Af Amer: >90 mL/min (ref >90)
Glucose, Bld: 68 mg/dL — ABNORMAL LOW (ref 70–99)
Total Protein: 6.2 g/dL (ref 6.0–8.3)

## 2013-04-09 LAB — LIPID PANEL
Cholesterol: 139 mg/dL (ref 0–200)
Total CHOL/HDL Ratio: 2 RATIO
VLDL: 15 mg/dL (ref 0–40)

## 2013-04-09 LAB — CBC
HCT: 39.1 % (ref 39.0–52.0)
Hemoglobin: 13.5 g/dL (ref 13.0–17.0)
MCH: 34.5 pg — ABNORMAL HIGH (ref 26.0–34.0)
MCHC: 34.5 g/dL (ref 30.0–36.0)
MCV: 100 fL (ref 78.0–100.0)
RDW: 14.2 % (ref 11.5–15.5)

## 2013-04-09 MED ORDER — SENNA 8.6 MG PO TABS
1.0000 | ORAL_TABLET | Freq: Two times a day (BID) | ORAL | Status: DC
  Administered 2013-04-10 – 2013-04-16 (×8): 8.6 mg via ORAL
  Filled 2013-04-09 (×21): qty 1

## 2013-04-09 MED ORDER — OXYCODONE HCL 5 MG PO TABS
10.0000 mg | ORAL_TABLET | ORAL | Status: DC | PRN
  Administered 2013-04-09 (×2): 10 mg via ORAL
  Filled 2013-04-09 (×2): qty 2

## 2013-04-09 MED ORDER — DOCUSATE SODIUM 100 MG PO CAPS
100.0000 mg | ORAL_CAPSULE | Freq: Two times a day (BID) | ORAL | Status: DC
  Administered 2013-04-10 – 2013-04-16 (×9): 100 mg via ORAL
  Filled 2013-04-09 (×21): qty 1

## 2013-04-09 MED ORDER — GABAPENTIN 400 MG PO CAPS
400.0000 mg | ORAL_CAPSULE | Freq: Three times a day (TID) | ORAL | Status: DC
  Administered 2013-04-09 – 2013-04-10 (×3): 400 mg via ORAL
  Filled 2013-04-09 (×5): qty 1

## 2013-04-09 MED ORDER — PANTOPRAZOLE SODIUM 20 MG PO TBEC
20.0000 mg | DELAYED_RELEASE_TABLET | Freq: Every day | ORAL | Status: DC
  Administered 2013-04-09 – 2013-04-19 (×11): 20 mg via ORAL
  Filled 2013-04-09 (×11): qty 1

## 2013-04-09 MED ORDER — VITAMIN B-12 100 MCG PO TABS
100.0000 ug | ORAL_TABLET | Freq: Every day | ORAL | Status: DC
  Administered 2013-04-09 – 2013-04-19 (×10): 100 ug via ORAL
  Filled 2013-04-09 (×12): qty 1

## 2013-04-09 MED ORDER — ENOXAPARIN SODIUM 40 MG/0.4ML ~~LOC~~ SOLN
40.0000 mg | SUBCUTANEOUS | Status: DC
  Administered 2013-04-09 – 2013-04-12 (×4): 40 mg via SUBCUTANEOUS
  Filled 2013-04-09 (×5): qty 0.4

## 2013-04-09 MED ORDER — INSULIN GLARGINE 100 UNIT/ML ~~LOC~~ SOLN
30.0000 [IU] | Freq: Every day | SUBCUTANEOUS | Status: DC
  Administered 2013-04-10: 30 [IU] via SUBCUTANEOUS
  Filled 2013-04-09 (×2): qty 0.3

## 2013-04-09 MED ORDER — HYDROMORPHONE HCL PF 1 MG/ML IJ SOLN
1.0000 mg | Freq: Once | INTRAMUSCULAR | Status: AC | PRN
  Administered 2013-04-09: 1 mg via INTRAVENOUS
  Filled 2013-04-09: qty 1

## 2013-04-09 MED ORDER — INSULIN ASPART 100 UNIT/ML ~~LOC~~ SOLN
0.0000 [IU] | Freq: Every day | SUBCUTANEOUS | Status: DC
  Administered 2013-04-09: 2 [IU] via SUBCUTANEOUS
  Administered 2013-04-10: 3 [IU] via SUBCUTANEOUS

## 2013-04-09 MED ORDER — FENTANYL CITRATE 0.05 MG/ML IJ SOLN
100.0000 ug | Freq: Once | INTRAMUSCULAR | Status: AC
  Administered 2013-04-09: 100 ug via INTRAVENOUS
  Filled 2013-04-09: qty 2

## 2013-04-09 MED ORDER — OXYCODONE HCL 5 MG PO TABS
15.0000 mg | ORAL_TABLET | ORAL | Status: DC | PRN
  Administered 2013-04-10 – 2013-04-19 (×44): 15 mg via ORAL
  Filled 2013-04-09 (×10): qty 3
  Filled 2013-04-09: qty 1
  Filled 2013-04-09 (×34): qty 3
  Filled 2013-04-09: qty 2

## 2013-04-09 MED ORDER — MORPHINE SULFATE ER 15 MG PO TBCR
60.0000 mg | EXTENDED_RELEASE_TABLET | Freq: Two times a day (BID) | ORAL | Status: DC
  Administered 2013-04-09 – 2013-04-19 (×20): 60 mg via ORAL
  Filled 2013-04-09: qty 4
  Filled 2013-04-09: qty 1
  Filled 2013-04-09: qty 4
  Filled 2013-04-09: qty 6
  Filled 2013-04-09 (×2): qty 4
  Filled 2013-04-09: qty 3
  Filled 2013-04-09 (×14): qty 4

## 2013-04-09 MED ORDER — MORPHINE SULFATE ER 15 MG PO TBCR: 60.0000 mg | EXTENDED_RELEASE_TABLET | Freq: Two times a day (BID) | ORAL | Status: DC

## 2013-04-09 MED ORDER — INSULIN ASPART 100 UNIT/ML ~~LOC~~ SOLN: 5.0000 [IU] | Freq: Three times a day (TID) | SUBCUTANEOUS | Status: DC

## 2013-04-09 MED ORDER — SERTRALINE HCL 100 MG PO TABS
200.0000 mg | ORAL_TABLET | Freq: Every day | ORAL | Status: DC
  Administered 2013-04-09 – 2013-04-19 (×11): 200 mg via ORAL
  Filled 2013-04-09 (×11): qty 2

## 2013-04-09 MED ORDER — OXYCODONE HCL 5 MG PO TABS
10.0000 mg | ORAL_TABLET | Freq: Once | ORAL | Status: AC
  Administered 2013-04-09: 10 mg via ORAL
  Filled 2013-04-09: qty 2

## 2013-04-09 MED ORDER — HYDROMORPHONE HCL PF 1 MG/ML IJ SOLN
1.0000 mg | Freq: Once | INTRAMUSCULAR | Status: AC
  Administered 2013-04-09: 1 mg via INTRAVENOUS
  Filled 2013-04-09: qty 1

## 2013-04-09 MED ORDER — ZOLPIDEM TARTRATE 5 MG PO TABS
5.0000 mg | ORAL_TABLET | Freq: Every evening | ORAL | Status: DC | PRN
  Filled 2013-04-09: qty 1

## 2013-04-09 MED ORDER — RAMIPRIL 10 MG PO CAPS
10.0000 mg | ORAL_CAPSULE | Freq: Every day | ORAL | Status: DC
  Administered 2013-04-09 – 2013-04-19 (×11): 10 mg via ORAL
  Filled 2013-04-09 (×11): qty 1

## 2013-04-09 MED ORDER — INSULIN ASPART 100 UNIT/ML ~~LOC~~ SOLN
0.0000 [IU] | Freq: Three times a day (TID) | SUBCUTANEOUS | Status: DC
  Administered 2013-04-09 – 2013-04-10 (×3): 5 [IU] via SUBCUTANEOUS
  Administered 2013-04-10 – 2013-04-11 (×2): 11 [IU] via SUBCUTANEOUS
  Administered 2013-04-11: 8 [IU] via SUBCUTANEOUS
  Administered 2013-04-11: 2 [IU] via SUBCUTANEOUS
  Administered 2013-04-12 (×2): 5 [IU] via SUBCUTANEOUS
  Administered 2013-04-12: 2 [IU] via SUBCUTANEOUS
  Administered 2013-04-13: 11 [IU] via SUBCUTANEOUS
  Administered 2013-04-13 – 2013-04-14 (×2): 8 [IU] via SUBCUTANEOUS
  Administered 2013-04-14 – 2013-04-15 (×2): 11 [IU] via SUBCUTANEOUS
  Administered 2013-04-15: 2 [IU] via SUBCUTANEOUS
  Administered 2013-04-15: 5 [IU] via SUBCUTANEOUS
  Administered 2013-04-16: 11 [IU] via SUBCUTANEOUS
  Administered 2013-04-16: 2 [IU] via SUBCUTANEOUS
  Administered 2013-04-17: 8 [IU] via SUBCUTANEOUS
  Administered 2013-04-17 – 2013-04-18 (×4): 3 [IU] via SUBCUTANEOUS
  Administered 2013-04-19: 5 [IU] via SUBCUTANEOUS
  Administered 2013-04-19: 2 [IU] via SUBCUTANEOUS

## 2013-04-09 MED ORDER — SALSALATE 750 MG PO TABS
750.0000 mg | ORAL_TABLET | Freq: Two times a day (BID) | ORAL | Status: DC
  Administered 2013-04-09 – 2013-04-19 (×20): 750 mg via ORAL
  Filled 2013-04-09 (×22): qty 1

## 2013-04-09 MED ORDER — BACLOFEN 10 MG PO TABS
10.0000 mg | ORAL_TABLET | Freq: Three times a day (TID) | ORAL | Status: DC
  Administered 2013-04-09 – 2013-04-19 (×31): 10 mg via ORAL
  Filled 2013-04-09 (×32): qty 1

## 2013-04-09 MED ORDER — ERGOCALCIFEROL 1.25 MG (50000 UT) PO CAPS
50000.0000 [IU] | ORAL_CAPSULE | ORAL | Status: DC
  Administered 2013-04-09 – 2013-04-16 (×2): 50000 [IU] via ORAL
  Filled 2013-04-09 (×3): qty 1

## 2013-04-09 NOTE — ED Notes (Signed)
Pt states he is unable to ambulate, [place] PA notified.

## 2013-04-09 NOTE — ED Notes (Signed)
Portable hip xray being completed

## 2013-04-09 NOTE — Progress Notes (Signed)
Orthopedic Tech Progress Note Patient Details:  [name] 05-06-1961 [id number] Crutches ordered for patient. Patient noncompliant at this time and will not stand for crutch fitting. States he is in too much pain and refuses to get up. Ortho Devices Type of Ortho Device: Crutches Ortho Device/Splint Interventions: Ordered   [place] R Thompson 04/09/2013, 1:31 PM

## 2013-04-09 NOTE — Progress Notes (Signed)
Orthopedic Tech Progress Note Patient Details:  [name] July 27, 1961 [id number]  Patient ID: [name], male   DOB: Jun 20, 1961, 52 y.o.   MRN: [id number] Trapeze bar patient helper  Nikki Dom 04/09/2013, 6:52 PM

## 2013-04-09 NOTE — ED Notes (Signed)
[name] Rachael phone [phone]

## 2013-04-09 NOTE — ED Notes (Signed)
Patient stepped off stool, had pain in right leg, fell to floor, landing on right hip.  Now with excruciating pain in right hip.

## 2013-04-09 NOTE — ED Notes (Signed)
Patient presents to ED via [facility]. Pt was getting up from a tall stool, lost his balance and fell directly on his right hip. Pt reportedly "crawled" to a chair and sat down. Pt took 60mg  long acting morphine at home and 5mg  Oxycodone. Pt called EMS at approx 0500 because pain was not getting any better. EMS gave pt 10mg  morphine IV. EMS stabalized hip with a blanket. Upon arrival to ED pt A&O x4. Hx of osteoporosis, and neuropathy.

## 2013-04-09 NOTE — ED Provider Notes (Signed)
CSN: [id number]     Arrival date & time 04/09/13  0620 History     First MD Initiated Contact with Patient 04/09/13 [phone]     Chief Complaint  Patient presents with  . Hip Injury   (Consider location/radiation/quality/duration/timing/severity/associated sxs/prior Treatment) The history is provided by the patient.    52 year old male with a past medical history of diabetes, hypertension, osteoporosis with fertility fracture, alcoholic cirrhosis and pancreatitis presents the emergency department chief complaint of fall and severe right hip pain.  Patient states he was sitting on elevated barstool when he slipped and fell off the chair onto his right hip.  He states he had immediate severe excruciating pain was unable to move the leg or ambulate.  EMS was called for transport here to the emergency department.   Patient has chronic neuropathy in his bilateral legs.  History of multiple back surgeries.  He is followed at the [facility] in [facility] Past Medical History  Diagnosis Date  . Diabetes mellitus   . Hypertension   . GERD (gastroesophageal reflux disease)   . Osteoporosis   . Migraine   . Chronic lower back pain   . Vitamin D deficiency   . Hepatomegaly   . Pancreatitis    Past Surgical History  Procedure Laterality Date  . Cholecystectomy    . Hernia repair    . Laminectomy     Family History  Problem Relation Age of Onset  . Cancer      breast/grandmother, prostate/grandfather   History  Substance Use Topics  . Smoking status: Former Games developer  . Smokeless tobacco: Not on file  . Alcohol Use: No    Review of Systems  Constitutional: Positive for diaphoresis. Negative for fever and chills.  Respiratory: Negative for cough and shortness of breath.   Cardiovascular: Negative for chest pain and palpitations.  Gastrointestinal: Negative for vomiting, abdominal pain, diarrhea and constipation.  Genitourinary: Negative for dysuria, urgency and frequency.  Musculoskeletal:  Positive for joint swelling and gait problem. Negative for myalgias and arthralgias.  Skin: Negative for rash and wound.  Neurological: Positive for numbness (chronic). Negative for headaches.  All other systems reviewed and are negative.    Allergies  Ketorolac tromethamine  Home Medications   Current Outpatient Rx  Name  Route  Sig  Dispense  Refill  . aspirin 81 MG EC tablet   Oral   Take 81 mg by mouth daily.           . ergocalciferol (VITAMIN D2) 50000 UNITS capsule   Oral   Take 50,000 Units by mouth once a week.           Marland Kitchen glucose blood test strip      Pt testing 4-6 times a day         . insulin aspart (NOVOLOG) 100 UNIT/ML injection   Subcutaneous   Inject into the skin 3 (three) times daily before meals.         . insulin aspart (NOVOLOG) 100 UNIT/ML injection      Inject 5 units [place] 3 times a day with meals   15 pen   3     Novolog Flex Pen   . insulin glargine (LANTUS SOLOSTAR) 100 UNIT/ML injection      Inject 22 units 2 times a day   15 pen   3   . Insulin Pen Needle 31G X 8 MM MISC   Does not apply   by Does not apply route.           Marland Kitchen  lansoprazole (PREVACID) 30 MG capsule   Oral   Take 1 capsule (30 mg total) by mouth daily.   90 capsule   3   . lidocaine (LIDODERM) 5 %   Transdermal   Place 1 patch onto the skin as needed. Remove & Discard patch within 12 hours or as directed by MD          . methadone (DOLOPHINE) 10 MG tablet      Take 3 tabs by mouth every morning, 2 tabs very evening          . oxyCODONE-acetaminophen (PERCOCET) 5-325 MG per tablet   Oral   Take 1 tablet by mouth every 8 (eight) hours as needed. Break through pain          . ramipril (ALTACE) 10 MG capsule   Oral   Take 1 capsule (10 mg total) by mouth daily.   90 capsule   3   . sertraline (ZOLOFT) 100 MG tablet   Oral   Take 100 mg by mouth daily. One and one half daily          BP 148/111  Pulse 88  Temp(Src) 98.9 F (37.2 C)  (Oral)  Resp 17  SpO2 93% Physical Exam  Nursing note and vitals reviewed. Constitutional:  Patient appears to be in exquisite pain.  He has his right leg flexed at the hip flexed at the knee.  He states he cannot deviate from this position at all.  He complains of lower back pain.  HENT:  Head: Normocephalic and atraumatic.  Eyes: Conjunctivae are normal. No scleral icterus.  Neck: Normal range of motion. Neck supple.  Cardiovascular: Normal rate, regular rhythm and normal heart sounds.   Pulmonary/Chest: Effort normal and breath sounds normal. No respiratory distress.  Abdominal: Soft. There is no tenderness.  Musculoskeletal: He exhibits no edema.  Distal pulses intact.  No apparent deformity.  Neurological: He is alert.  Skin: Skin is warm and dry.  Psychiatric: His behavior is normal.    ED Course   Procedures (including critical care time)  Labs Reviewed - No data to display No results found. No diagnosis found.  MDM  Patient with history of fertility fracture is coming in complaining of severe pain after fall and the right hip.  Concern for possible fracture.  No deformity or shortening.  He is hypertensive and mildly diaphoretic.  Patient is on chronic pain medications including  Long-acting oxycodone and OxyIR 5 mg for breakthrough pain.  He does not admit to taking methadone.  Patient received 10 mg mg morphine IV from the EMS.  I have administered 100 mcg fentanyl and sent him for x-ray.   8:17 AM Filed Vitals:   04/09/13 0752  BP: 142/90  Pulse: 92  Temp: 98.6 F (37 C)  Resp: 20   Patient has been given multiple rounds of pain medication.  I suspect due to his long-term chronic use of opiates he has little opiate receptor available and the patient's pain will not be well controlled here in the ED.  Patient refused to transfer from history chert to the table for x-ray.  There has been some communication between x-ray techs and Dr. Ranae Palms  I have asked that the  techs come to his room to do a portable hip x-ray.  This has lead to a delay in care for the patient here    9:02 AM BP 142/90  Pulse 92  Temp(Src) 98.6 F (37 C) (Oral)  Resp  20  SpO2 94% Patient with negative x-ray of the hip however there is concern for possible subtle nondisplaced fracture of the inferior pubic ramus.  I sent the patient for CT lumbar spine pelvis and right hip considering his history of chronic back pains of multiple surgery to rule out lumbar fracture.   10:37 AM CT scan of the pelvis shows nondisplaced fractures of the acetabular roof and anterior column on the right.  L. spine CT negative.  I personally reviewed the images using our PACs system.  I discussed the finding with Dr. Ranae Palms.  Will consult to [facility] orthopedics where this patient has been seen previously.   Patient unable to ambulate with crutches.  There was a delay in the patient's care as I was involved in a therapeutic paracentesis here in the ED which took approximately one hour. I've spoken with Dr. Joseph Art who will admit the patient for pain control and placement due to his inability to ambulate. The patient appears reasonably stabilized for admission considering the current resources, flow, and capabilities available in the ED at this time, and I doubt any other [facility] requiring further screening and/or treatment in the ED prior to admission.   Arthor Captain, PA-C 04/09/13 1550

## 2013-04-09 NOTE — H&P (Signed)
[facility] History and Physical  [name] MAURIELLO [name]:[id number] DOB: 03/02/1961 DOA: 04/09/2013  Referring physician:  PCP: [name], MD  Specialists:   Chief Complaint: Right hip pain unable to ambulate  HPI: [name]  52 yo WM PMHx DM Type 2, hypertension, osteoporosis with Fragility fractures, alcoholic cirrhosis, osteoporosis and pancreatitis presents the emergency department chief complaint of fall and severe right hip pain. Patient states he was sitting on elevated barstool when he slipped and fell off the chair onto his right hip. He states he had immediate severe excruciating pain was unable to move the leg or ambulate. EMS was called for transport here to the emergency department. Patient has chronic neuropathy in his bilateral legs. History of multiple back surgeries. He is followed at the [facility] in [place]. TODAY states having excruciating, shooting pain starting in right hip    Review of Systems: The patient denies anorexia, fever, weight loss,, vision loss, decreased hearing, hoarseness, chest pain, syncope, dyspnea on exertion, peripheral edema, balance deficits, hemoptysis, abdominal pain, melena, hematochezia, severe indigestion/heartburn, hematuria, incontinence, genital sores, muscle weakness, suspicious skin lesions, transient blindness,  depression, unusual weight change, abnormal bleeding, enlarged lymph nodes, angioedema, and breast masses.      Procedure  CT Pelvis w/o Contrast 04/09/2013  Nondisplaced fractures of the acetabulum on the right affecting the  acetabular roof and the anterior column.   Past Medical History  Diagnosis Date  . Diabetes mellitus   . Hypertension   . GERD (gastroesophageal reflux disease)   . Osteoporosis   . Migraine   . Chronic lower back pain   . Vitamin D deficiency   . Hepatomegaly   . Pancreatitis    Past Surgical History  Procedure Laterality Date  . Cholecystectomy    . Hernia repair    .  Laminectomy     Social History:  reports that he has quit smoking. He does not have any smokeless tobacco history on file. He reports that he does not drink alcohol or use illicit drugs.   Allergies  Allergen Reactions  . Ketorolac Tromethamine     Hives (toradol)    Family History  Problem Relation Age of Onset  . Cancer      breast/grandmother, prostate/grandfather      Prior to Admission medications   Medication Sig Start Date End Date Taking? Authorizing Provider  baclofen (LIORESAL) 10 MG tablet Take 10 mg by mouth 3 (three) times daily.   Yes Historical Provider, MD  cyanocobalamin 1000 MCG tablet Take 100 mcg by mouth daily.   Yes Historical Provider, MD  ergocalciferol (VITAMIN D2) 50000 UNITS capsule Take 50,000 Units by mouth once a week. On Saturday   Yes Historical Provider, MD  gabapentin (NEURONTIN) 400 MG capsule Take 400 mg by mouth 3 (three) times daily.   Yes Historical Provider, MD  insulin aspart (NOVOLOG) 100 UNIT/ML injection Inject 5-10 Units into the skin 3 (three) times daily before meals.  12/05/11  Yes [name], MD  insulin glargine (LANTUS) 100 UNIT/ML injection Inject 30 Units into the skin daily.   Yes Historical Provider, MD  morphine (MS CONTIN) 60 MG 12 hr tablet Take 60 mg by mouth 2 (two) times daily.   Yes Historical Provider, MD  oxyCODONE (OXY IR/ROXICODONE) 5 MG immediate release tablet Take 10 mg by mouth every 4 (four) hours as needed for pain.   Yes Historical Provider, MD  pantoprazole (PROTONIX) 20 MG tablet Take 20 mg by mouth daily.  Yes Historical Provider, MD  ramipril (ALTACE) 10 MG capsule Take 1 capsule (10 mg total) by mouth daily. 04/22/12  Yes [name], MD  salsalate (DISALCID) 750 MG tablet Take 750 mg by mouth 2 (two) times daily.   Yes Historical Provider, MD  sertraline (ZOLOFT) 100 MG tablet Take 200 mg by mouth daily.    Yes Historical Provider, MD  Insulin Pen Needle 31G X 8 MM MISC by Does not apply route.       Historical Provider, MD   Physical Exam: Filed Vitals:   04/09/13 1330 04/09/13 1430 04/09/13 1436 04/09/13 1541  BP: 144/100 137/96  137/84  Pulse: 74 70  71  Temp:   98.4 F (36.9 C) 98.7 F (37.1 C)  TempSrc:   Oral Oral  Resp:   20 17  SpO2: 94% 97%  96%     General: Alert, NAD unless patient moves   Eyes: Pupils equal reactive to light and accommodation  Cardiovascular: Regular rhythm and rate, negative murmurs rubs gallops, DP/PT pulse on the right foot +1  Respiratory: Clear to auscultation bilateral  Abdomen: Soft nontender nondistended plus bowel sounds  Musculoskeletal: Right hip slightly extroverted calf resting on a pillow, within normal limits for warmth and capillary refill   Labs on Admission:  Basic Metabolic Panel:  Recent Labs Lab 04/09/13 0932  NA 138  K 4.3  CL 102  CO2 26  GLUCOSE 68*  BUN 9  CREATININE 0.58  CALCIUM 8.9   Liver Function Tests:  Recent Labs Lab 04/09/13 0932  AST 85*  ALT 48  ALKPHOS 265*  BILITOT 0.7  PROT 6.2  ALBUMIN 3.3*   No results found for this basename: LIPASE, AMYLASE,  in the last 168 hours No results found for this basename: AMMONIA,  in the last 168 hours CBC:  Recent Labs Lab 04/09/13 0932  WBC 9.1  HGB 13.5  HCT 39.1  MCV 100.0  PLT 130*   Cardiac Enzymes: No results found for this basename: CKTOTAL, CKMB, CKMBINDEX, TROPONINI,  in the last 168 hours  BNP (last 3 results) No results found for this basename: PROBNP,  in the last 8760 hours CBG:  Recent Labs Lab 04/09/13 0705 04/09/13 1143 04/09/13 1719  GLUCAP 126* 90 244*    Radiological Exams on Admission: Ct Lumbar Spine Wo Contrast  04/09/2013   *RADIOLOGY REPORT*  Clinical data:  The fall with back and right hip pain.  Negative radiographs.  CT LUMBAR SPINE WITHOUT CONTRAST  Technique:  Multidetector CT imaging of the lumbar spine was performed without intravenous contrast administration.  Multiplanar CT image  reconstructions were also generated.  Comparison:  Radiography same day.  CT abdomen 08/06/2007.  Findings:  Alignment is normal.  There are old Schmorl's node defects at the superior endplate of T12.  There are newly seen but chronic appearing Schmorl's node defects at the inferior end plate of L5, more on the right than the left.  These are chronic based on surrounding sclerotic change.  No acute fracture of the spine is evident.  There are some degenerative changes of the sacroiliac joints, right more than left.  There is bulging of the disc at L4- 5.  There is shallow broad-based protrusion of the disc at L5-S1. There is mild lower lumbar facet arthropathy.  There is atherosclerosis of the aorta and its branch vessels but no evidence of aneurysm.  IMPRESSION: No acute finding in the lumbar region.  Chronic end plate Schmorl's nodes at  T12 and L5.  Sacroiliac degeneration right more than left.  Disc bulge at L4-5.  Shallow disc protrusion at L5-S1.  CT PELVIS WITHOUT CONTRAST  Technique:  Multidetector CT imaging of the pelvis was performed following the standard protocol without intravenous contrast.   Findings:  There is a nondisplaced acetabular fracture on the right involving the acetabular roof and the anterior column.  No fracture of the femur is identified.  No other pelvic fracture.  No soft tissue hematoma.  IMPRESSION: Nondisplaced fractures of the acetabulum on the right affecting the acetabular roof and the anterior column.   Original Report Authenticated By: Paulina Fusi, M.D.   Ct Pelvis Wo Contrast  04/09/2013   *RADIOLOGY REPORT*  Clinical data:  The fall with back and right hip pain.  Negative radiographs.  CT LUMBAR SPINE WITHOUT CONTRAST  Technique:  Multidetector CT imaging of the lumbar spine was performed without intravenous contrast administration.  Multiplanar CT image reconstructions were also generated.  Comparison:  Radiography same day.  CT abdomen 08/06/2007.  Findings:  Alignment  is normal.  There are old Schmorl's node defects at the superior endplate of T12.  There are newly seen but chronic appearing Schmorl's node defects at the inferior end plate of L5, more on the right than the left.  These are chronic based on surrounding sclerotic change.  No acute fracture of the spine is evident.  There are some degenerative changes of the sacroiliac joints, right more than left.  There is bulging of the disc at L4- 5.  There is shallow broad-based protrusion of the disc at L5-S1. There is mild lower lumbar facet arthropathy.  There is atherosclerosis of the aorta and its branch vessels but no evidence of aneurysm.  IMPRESSION: No acute finding in the lumbar region.  Chronic end plate Schmorl's nodes at T12 and L5.  Sacroiliac degeneration right more than left.  Disc bulge at L4-5.  Shallow disc protrusion at L5-S1.  CT PELVIS WITHOUT CONTRAST  Technique:  Multidetector CT imaging of the pelvis was performed following the standard protocol without intravenous contrast.   Findings:  There is a nondisplaced acetabular fracture on the right involving the acetabular roof and the anterior column.  No fracture of the femur is identified.  No other pelvic fracture.  No soft tissue hematoma.  IMPRESSION: Nondisplaced fractures of the acetabulum on the right affecting the acetabular roof and the anterior column.   Original Report Authenticated By: Paulina Fusi, M.D.   Dg Hip Portable 1 View Right  04/09/2013   *RADIOLOGY REPORT*  Clinical Data: Hip pain, fall  PORTABLE RIGHT HIP - 1 VIEW  Comparison: None.  Findings: Internal and external rotation views demonstrate no fracture or dislocation of the right femur.  There is arthritis of the hip joint with narrowing, osteophyte formation, and subchondral cystic change.There is a subtle focus of cortical irregularity involving the right inferior pubic ramus.  IMPRESSION: No hip fracture.  Possible subtle nondisplaced fracture inferior pubic ramus.  Consider  CT if clinically appropriate.   Original Report Authenticated By: Esperanza Heir, M.D.    EKG: Pending  Assessment/Plan Principal Problem:   Acetabulum fracture, right Active Problems:   HYPERTENSION   ALCOHOLIC HEPATITIS   CIRRHOSIS, ALCOHOLIC   OSTEOPOROSIS   Hepatomegaly   LIVER FUNCTION TESTS, ABNORMAL, HX OF   Type II or unspecified type diabetes mellitus with neurological manifestations, not stated as uncontrolled(250.60)   1. Right acetabulum fracture; patient placed on his home regimen of pain medication,  which is currently controlling pain. Counseled patient that if pain increases we were here to control his pain. Per Arthor Captain, PA-C. Note discussed the finding with Dr. Ranae Palms. And they would consult to [facility] orthopedics where this patient has been seen previously. Request to go to a rehabilitation center in [facility]   2. HTN; controlled after patient settles into one spot. Continue home medication  3. DM type 2; obtain hemoglobin A1c, control with SSI  4. HLD; will rule out hyperlipidemia obtain lipid panel  5. Hepatomegaly; stable nontender abdomen    Code Status: Full  Disposition Plan: Rehabilitation Center   Time spent: 60 minutes  Drema Dallas [facility] Pager [phone]  If 7PM-7AM, please contact night-coverage [email or web address] Password [email or web address] 04/09/2013, 8:10 PM

## 2013-04-10 DIAGNOSIS — E1149 Type 2 diabetes mellitus with other diabetic neurological complication: Secondary | ICD-10-CM

## 2013-04-10 DIAGNOSIS — I10 Essential (primary) hypertension: Secondary | ICD-10-CM

## 2013-04-10 DIAGNOSIS — S32409A Unspecified fracture of unspecified acetabulum, initial encounter for closed fracture: Principal | ICD-10-CM

## 2013-04-10 LAB — CBC WITH DIFFERENTIAL/PLATELET
Basophils Absolute: 0 10*3/uL (ref 0.0–0.1)
Basophils Relative: 0 % (ref 0–1)
Eosinophils Absolute: 0.1 10*3/uL (ref 0.0–0.7)
Eosinophils Relative: 1 % (ref 0–5)
HCT: 41.7 % (ref 39.0–52.0)
Hemoglobin: 14.4 g/dL (ref 13.0–17.0)
Lymphocytes Relative: 15 % (ref 12–46)
Lymphs Abs: 1.1 10*3/uL (ref 0.7–4.0)
MCH: 34.5 pg — ABNORMAL HIGH (ref 26.0–34.0)
MCHC: 34.5 g/dL (ref 30.0–36.0)
MCV: 100 fL (ref 78.0–100.0)
Monocytes Absolute: 0.8 10*3/uL (ref 0.1–1.0)
Monocytes Relative: 11 % (ref 3–12)
Neutro Abs: 5.4 10*3/uL (ref 1.7–7.7)
Neutrophils Relative %: 74 % (ref 43–77)
Platelets: 105 10*3/uL — ABNORMAL LOW (ref 150–400)
RBC: 4.17 MIL/uL — ABNORMAL LOW (ref 4.22–5.81)
RDW: 13.6 % (ref 11.5–15.5)
WBC: 7.3 10*3/uL (ref 4.0–10.5)

## 2013-04-10 LAB — GLUCOSE, CAPILLARY
Glucose-Capillary: 299 mg/dL — ABNORMAL HIGH (ref 70–99)
Glucose-Capillary: 338 mg/dL — ABNORMAL HIGH (ref 70–99)

## 2013-04-10 LAB — HEMOGLOBIN A1C
Hgb A1c MFr Bld: 7.6 % — ABNORMAL HIGH (ref <5.7)
Mean Plasma Glucose: 171 mg/dL — ABNORMAL HIGH (ref <117)

## 2013-04-10 MED ORDER — INSULIN ASPART 100 UNIT/ML ~~LOC~~ SOLN
3.0000 [IU] | Freq: Three times a day (TID) | SUBCUTANEOUS | Status: DC
  Administered 2013-04-10 – 2013-04-11 (×2): 3 [IU] via SUBCUTANEOUS

## 2013-04-10 MED ORDER — HYDROMORPHONE HCL PF 1 MG/ML IJ SOLN
1.0000 mg | INTRAMUSCULAR | Status: DC | PRN
  Administered 2013-04-10 – 2013-04-11 (×6): 2 mg via INTRAVENOUS
  Administered 2013-04-11: 1 mg via INTRAVENOUS
  Administered 2013-04-11: 2 mg via INTRAVENOUS
  Administered 2013-04-12 (×3): 1 mg via INTRAVENOUS
  Filled 2013-04-10: qty 2
  Filled 2013-04-10 (×3): qty 1
  Filled 2013-04-10: qty 2
  Filled 2013-04-10: qty 1
  Filled 2013-04-10 (×5): qty 2

## 2013-04-10 MED ORDER — GABAPENTIN 300 MG PO CAPS
600.0000 mg | ORAL_CAPSULE | Freq: Three times a day (TID) | ORAL | Status: DC
  Administered 2013-04-10 – 2013-04-11 (×3): 600 mg via ORAL
  Filled 2013-04-10 (×6): qty 2

## 2013-04-10 MED ORDER — HYDROMORPHONE HCL PF 1 MG/ML IJ SOLN
2.0000 mg | Freq: Once | INTRAMUSCULAR | Status: AC
  Administered 2013-04-10: 2 mg via INTRAVENOUS

## 2013-04-10 MED ORDER — HYDROMORPHONE HCL PF 1 MG/ML IJ SOLN: 1.0000 mg | INTRAMUSCULAR | Status: DC | PRN

## 2013-04-10 MED ORDER — HYDROMORPHONE HCL PF 1 MG/ML IJ SOLN
INTRAMUSCULAR | Status: AC
  Filled 2013-04-10: qty 2

## 2013-04-10 NOTE — Progress Notes (Signed)
Patient ID: [name]  male  NWG:[id number]    DOB: 1960/12/22    DOA: 04/09/2013  PCP: [name] Covey, MD  Assessment/Plan: Principal Problem:   Right acetabulum fracture - Discussed with Dr. Rennis Chris, and recommended that this is nonoperative fracture, patient will need pain control and physical therapy as per their recommendations - PT orders placed - Patient requested that he would prefer going to rehabilitation at [facility] and his PCP is Dr [name] in [place].   HTN; controlled - Continue home medication    DM type 2- poorly controlled -  obtain hemoglobin A1c, control with SSI  -Will add meal coverage   HLD; will rule out hyperlipidemia obtain lipid panel   DVT Prophylaxis: lovenox  Code Status:  Disposition:    Subjective: Complaining of pain in his right hip, states excruciating and shooting down  Objective: Weight change:  No intake or output data in the 24 hours ending 04/10/13 1400 Blood pressure 146/90, pulse 68, temperature 98.5 F (36.9 C), temperature source Oral, resp. rate 16, SpO2 98.00%.  Physical Exam: General: Alert and awake, oriented x3, not in any acute distress. CVS: S1-S2 clear, no murmur rubs or gallops Chest: clear to auscultation bilaterally, no wheezing, rales or rhonchi Abdomen: soft nontender, nondistended, normal bowel sounds  Extremities: no cyanosis, clubbing or edema noted bilaterally   Lab Results: Basic Metabolic Panel:  Recent Labs Lab 04/09/13 0932  NA 138  K 4.3  CL 102  CO2 26  GLUCOSE 68*  BUN 9  CREATININE 0.58  CALCIUM 8.9   Liver Function Tests:  Recent Labs Lab 04/09/13 0932  AST 85*  ALT 48  ALKPHOS 265*  BILITOT 0.7  PROT 6.2  ALBUMIN 3.3*   No results found for this basename: LIPASE, AMYLASE,  in the last 168 hours No results found for this basename: AMMONIA,  in the last 168 hours CBC:  Recent Labs Lab 04/09/13 0932 04/10/13 0622  WBC 9.1 7.3  NEUTROABS  --  5.4  HGB 13.5 14.4   HCT 39.1 41.7  MCV 100.0 100.0  PLT 130* 105*   Cardiac Enzymes: No results found for this basename: CKTOTAL, CKMB, CKMBINDEX, TROPONINI,  in the last 168 hours BNP: No components found with this basename: POCBNP,  CBG:  Recent Labs Lab 04/09/13 1143 04/09/13 1719 04/09/13 2206 04/10/13 0710 04/10/13 1221  GLUCAP 90 244* 216* 338* 210*     Micro Results: No results found for this or any previous visit (from the past 240 hour(s)).  Studies/Results: Ct Lumbar Spine Wo Contrast  04/09/2013   *RADIOLOGY REPORT*  Clinical data:  The fall with back and right hip pain.  Negative radiographs.  CT LUMBAR SPINE WITHOUT CONTRAST  Technique:  Multidetector CT imaging of the lumbar spine was performed without intravenous contrast administration.  Multiplanar CT image reconstructions were also generated.  Comparison:  Radiography same day.  CT abdomen 08/06/2007.  Findings:  Alignment is normal.  There are old Schmorl's node defects at the superior endplate of T12.  There are newly seen but chronic appearing Schmorl's node defects at the inferior end plate of L5, more on the right than the left.  These are chronic based on surrounding sclerotic change.  No acute fracture of the spine is evident.  There are some degenerative changes of the sacroiliac joints, right more than left.  There is bulging of the disc at L4- 5.  There is shallow broad-based protrusion of the disc at L5-S1. There is mild  lower lumbar facet arthropathy.  There is atherosclerosis of the aorta and its branch vessels but no evidence of aneurysm.  IMPRESSION: No acute finding in the lumbar region.  Chronic end plate Schmorl's nodes at T12 and L5.  Sacroiliac degeneration right more than left.  Disc bulge at L4-5.  Shallow disc protrusion at L5-S1.  CT PELVIS WITHOUT CONTRAST  Technique:  Multidetector CT imaging of the pelvis was performed following the standard protocol without intravenous contrast.   Findings:  There is a  nondisplaced acetabular fracture on the right involving the acetabular roof and the anterior column.  No fracture of the femur is identified.  No other pelvic fracture.  No soft tissue hematoma.  IMPRESSION: Nondisplaced fractures of the acetabulum on the right affecting the acetabular roof and the anterior column.   Original Report Authenticated By: [name], M.D.   Ct Pelvis Wo Contrast  04/09/2013   *RADIOLOGY REPORT*  Clinical data:  The fall with back and right hip pain.  Negative radiographs.  CT LUMBAR SPINE WITHOUT CONTRAST  Technique:  Multidetector CT imaging of the lumbar spine was performed without intravenous contrast administration.  Multiplanar CT image reconstructions were also generated.  Comparison:  Radiography same day.  CT abdomen 08/06/2007.  Findings:  Alignment is normal.  There are old Schmorl's node defects at the superior endplate of T12.  There are newly seen but chronic appearing Schmorl's node defects at the inferior end plate of L5, more on the right than the left.  These are chronic based on surrounding sclerotic change.  No acute fracture of the spine is evident.  There are some degenerative changes of the sacroiliac joints, right more than left.  There is bulging of the disc at L4- 5.  There is shallow broad-based protrusion of the disc at L5-S1. There is mild lower lumbar facet arthropathy.  There is atherosclerosis of the aorta and its branch vessels but no evidence of aneurysm.  IMPRESSION: No acute finding in the lumbar region.  Chronic end plate Schmorl's nodes at T12 and L5.  Sacroiliac degeneration right more than left.  Disc bulge at L4-5.  Shallow disc protrusion at L5-S1.  CT PELVIS WITHOUT CONTRAST  Technique:  Multidetector CT imaging of the pelvis was performed following the standard protocol without intravenous contrast.   Findings:  There is a nondisplaced acetabular fracture on the right involving the acetabular roof and the anterior column.  No fracture of the  femur is identified.  No other pelvic fracture.  No soft tissue hematoma.  IMPRESSION: Nondisplaced fractures of the acetabulum on the right affecting the acetabular roof and the anterior column.   Original Report Authenticated By: [name], M.D.   Dg Hip Portable 1 View Right  04/09/2013   *RADIOLOGY REPORT*  Clinical Data: Hip pain, fall  PORTABLE RIGHT HIP - 1 VIEW  Comparison: None.  Findings: Internal and external rotation views demonstrate no fracture or dislocation of the right femur.  There is arthritis of the hip joint with narrowing, osteophyte formation, and subchondral cystic change.There is a subtle focus of cortical irregularity involving the right inferior pubic ramus.  IMPRESSION: No hip fracture.  Possible subtle nondisplaced fracture inferior pubic ramus.  Consider CT if clinically appropriate.   Original Report Authenticated By: Esperanza Heir, M.D.    Medications: Scheduled Meds: . baclofen  10 mg Oral TID  . docusate sodium  100 mg Oral BID  . enoxaparin (LOVENOX) injection  40 mg Subcutaneous Q24H  . ergocalciferol  50,000 Units Oral Weekly  . gabapentin  400 mg Oral TID  . HYDROmorphone      . insulin aspart  0-15 Units Subcutaneous TID WC  . insulin aspart  0-5 Units Subcutaneous QHS  . insulin glargine  30 Units Subcutaneous Daily  . morphine  60 mg Oral BID  . pantoprazole  20 mg Oral Daily  . ramipril  10 mg Oral Daily  . salsalate  750 mg Oral BID  . senna  1 tablet Oral BID  . sertraline  200 mg Oral Daily  . cyanocobalamin  100 mcg Oral Daily      LOS: 1 day   RAI,RIPUDEEP M.D. [facility] 04/10/2013, 2:00 PM Pager: [phone]  If 7PM-7AM, please contact night-coverage [email or web address] Password [email or web address]

## 2013-04-10 NOTE — Consult Note (Signed)
Reason for Consult:   Right acetabular fracture Referring Physician:   ED Physician  [name] is an 52 y.o. male.  HPI: 52 yo WM PMHx DM Type 2, hypertension, osteoporosis with multiple insufficiency fractures, alcoholic cirrhosis, osteoporosis and pancreatitis presents the emergency department chief complaint of fall and severe right hip pain. Patient states he was sitting on elevated barstool when he slipped and fell off the chair onto his right hip. He states he had immediate severe excruciating pain was unable to move the leg or ambulate. EMS was called for transport here to the emergency department. Patient has chronic neuropathy in his bilateral legs. History of multiple back surgeries. He is followed at the [facility] in [place]. TODAY states having excruciating, shooting pain starting in right hip.  Past Medical History  Diagnosis Date  . Diabetes mellitus   . Hypertension   . GERD (gastroesophageal reflux disease)   . Osteoporosis   . Migraine   . Chronic lower back pain   . Vitamin D deficiency   . Hepatomegaly   . Pancreatitis     Past Surgical History  Procedure Laterality Date  . Cholecystectomy    . Hernia repair    . Laminectomy      Family History  Problem Relation Age of Onset  . Cancer      breast/grandmother, prostate/grandfather    Social History:  reports that he has quit smoking. He does not have any smokeless tobacco history on file. He reports that he does not drink alcohol or use illicit drugs.  Allergies:  Allergies  Allergen Reactions  . Ketorolac Tromethamine     Hives (toradol)     Results for orders placed during the hospital encounter of 04/09/13 (from the past 48 hour(s))  GLUCOSE, CAPILLARY     Status: Abnormal   Collection Time    04/09/13  7:05 AM      Result Value Range   Glucose-Capillary 126 (*) 70 - 99 mg/dL  CBC     Status: Abnormal   Collection Time    04/09/13  9:32 AM      Result Value Range   WBC 9.1  4.0 - 10.5  K/uL   RBC 3.91 (*) 4.22 - 5.81 MIL/uL   Hemoglobin 13.5  13.0 - 17.0 g/dL   HCT 16.1  09.6 - 04.5 %   MCV 100.0  78.0 - 100.0 fL   MCH 34.5 (*) 26.0 - 34.0 pg   MCHC 34.5  30.0 - 36.0 g/dL   RDW 40.9  81.1 - 91.4 %   Platelets 130 (*) 150 - 400 K/uL  COMPREHENSIVE METABOLIC PANEL     Status: Abnormal   Collection Time    04/09/13  9:32 AM      Result Value Range   Sodium 138  135 - 145 mEq/L   Potassium 4.3  3.5 - 5.1 mEq/L   Chloride 102  96 - 112 mEq/L   CO2 26  19 - 32 mEq/L   Glucose, Bld 68 (*) 70 - 99 mg/dL   BUN 9  6 - 23 mg/dL   Creatinine, Ser 7.82  0.50 - 1.35 mg/dL   Calcium 8.9  8.4 - 95.6 mg/dL   Total Protein 6.2  6.0 - 8.3 g/dL   Albumin 3.3 (*) 3.5 - 5.2 g/dL   AST 85 (*) 0 - 37 U/L   ALT 48  0 - 53 U/L   Alkaline Phosphatase 265 (*) 39 - 117 U/L  Total Bilirubin 0.7  0.3 - 1.2 mg/dL   GFR calc non Af Amer >90  >90 mL/min   GFR calc Af Amer >90  >90 mL/min   Comment: (NOTE)     The eGFR has been calculated using the CKD EPI equation.     This calculation has not been validated in all clinical situations.     eGFR's persistently <90 mL/min signify possible Chronic Kidney     Disease.  GLUCOSE, CAPILLARY     Status: None   Collection Time    04/09/13 11:43 AM      Result Value Range   Glucose-Capillary 90  70 - 99 mg/dL  GLUCOSE, CAPILLARY     Status: Abnormal   Collection Time    04/09/13  5:19 PM      Result Value Range   Glucose-Capillary 244 (*) 70 - 99 mg/dL  GLUCOSE, CAPILLARY     Status: Abnormal   Collection Time    04/09/13 10:06 PM      Result Value Range   Glucose-Capillary 216 (*) 70 - 99 mg/dL  LIPID PANEL     Status: None   Collection Time    04/09/13 10:40 PM      Result Value Range   Cholesterol 139  0 - 200 mg/dL   Triglycerides 77  <295 mg/dL   HDL 68  >62 mg/dL   Total CHOL/HDL Ratio 2.0     VLDL 15  0 - 40 mg/dL   LDL Cholesterol 56  0 - 99 mg/dL   Comment:            Total Cholesterol/HDL:CHD Risk     Coronary Heart  Disease Risk Table                         Men   Women      1/2 Average Risk   3.4   3.3      Average Risk       5.0   4.4      2 X Average Risk   9.6   7.1      3 X Average Risk  23.4   11.0                Use the calculated Patient Ratio     above and the CHD Risk Table     to determine the patient's CHD Risk.                ATP III CLASSIFICATION (LDL):      <100     mg/dL   Optimal      130-865  mg/dL   Near or Above                        Optimal      130-159  mg/dL   Borderline      784-696  mg/dL   High      >295     mg/dL   Very High  CBC WITH DIFFERENTIAL     Status: Abnormal   Collection Time    04/10/13  6:22 AM      Result Value Range   WBC 7.3  4.0 - 10.5 K/uL   RBC 4.17 (*) 4.22 - 5.81 MIL/uL   Hemoglobin 14.4  13.0 - 17.0 g/dL   HCT 28.4  13.2 - 44.0 %   MCV 100.0  78.0 - 100.0 fL  MCH 34.5 (*) 26.0 - 34.0 pg   MCHC 34.5  30.0 - 36.0 g/dL   RDW 40.9  81.1 - 91.4 %   Platelets 105 (*) 150 - 400 K/uL   Comment: PLATELET COUNT CONFIRMED BY SMEAR   Neutrophils Relative % 74  43 - 77 %   Neutro Abs 5.4  1.7 - 7.7 K/uL   Lymphocytes Relative 15  12 - 46 %   Lymphs Abs 1.1  0.7 - 4.0 K/uL   Monocytes Relative 11  3 - 12 %   Monocytes Absolute 0.8  0.1 - 1.0 K/uL   Eosinophils Relative 1  0 - 5 %   Eosinophils Absolute 0.1  0.0 - 0.7 K/uL   Basophils Relative 0  0 - 1 %   Basophils Absolute 0.0  0.0 - 0.1 K/uL    Ct Lumbar Spine Wo Contrast  04/09/2013   *RADIOLOGY REPORT*  Clinical data:  The fall with back and right hip pain.  Negative radiographs.  CT LUMBAR SPINE WITHOUT CONTRAST  Technique:  Multidetector CT imaging of the lumbar spine was performed without intravenous contrast administration.  Multiplanar CT image reconstructions were also generated.  Comparison:  Radiography same day.  CT abdomen 08/06/2007.  Findings:  Alignment is normal.  There are old Schmorl's node defects at the superior endplate of T12.  There are newly seen but chronic appearing  Schmorl's node defects at the inferior end plate of L5, more on the right than the left.  These are chronic based on surrounding sclerotic change.  No acute fracture of the spine is evident.  There are some degenerative changes of the sacroiliac joints, right more than left.  There is bulging of the disc at L4- 5.  There is shallow broad-based protrusion of the disc at L5-S1. There is mild lower lumbar facet arthropathy.  There is atherosclerosis of the aorta and its branch vessels but no evidence of aneurysm.  IMPRESSION: No acute finding in the lumbar region.  Chronic end plate Schmorl's nodes at T12 and L5.  Sacroiliac degeneration right more than left.  Disc bulge at L4-5.  Shallow disc protrusion at L5-S1.  CT PELVIS WITHOUT CONTRAST  Technique:  Multidetector CT imaging of the pelvis was performed following the standard protocol without intravenous contrast.   Findings:  There is a nondisplaced acetabular fracture on the right involving the acetabular roof and the anterior column.  No fracture of the femur is identified.  No other pelvic fracture.  No soft tissue hematoma.  IMPRESSION: Nondisplaced fractures of the acetabulum on the right affecting the acetabular roof and the anterior column.   Original Report Authenticated By: Paulina Fusi, M.D.   Ct Pelvis Wo Contrast  04/09/2013   *RADIOLOGY REPORT*  Clinical data:  The fall with back and right hip pain.  Negative radiographs.  CT LUMBAR SPINE WITHOUT CONTRAST  Technique:  Multidetector CT imaging of the lumbar spine was performed without intravenous contrast administration.  Multiplanar CT image reconstructions were also generated.  Comparison:  Radiography same day.  CT abdomen 08/06/2007.  Findings:  Alignment is normal.  There are old Schmorl's node defects at the superior endplate of T12.  There are newly seen but chronic appearing Schmorl's node defects at the inferior end plate of L5, more on the right than the left.  These are chronic based on  surrounding sclerotic change.  No acute fracture of the spine is evident.  There are some degenerative changes of the sacroiliac joints, right  more than left.  There is bulging of the disc at L4- 5.  There is shallow broad-based protrusion of the disc at L5-S1. There is mild lower lumbar facet arthropathy.  There is atherosclerosis of the aorta and its branch vessels but no evidence of aneurysm.  IMPRESSION: No acute finding in the lumbar region.  Chronic end plate Schmorl's nodes at T12 and L5.  Sacroiliac degeneration right more than left.  Disc bulge at L4-5.  Shallow disc protrusion at L5-S1.  CT PELVIS WITHOUT CONTRAST  Technique:  Multidetector CT imaging of the pelvis was performed following the standard protocol without intravenous contrast.   Findings:  There is a nondisplaced acetabular fracture on the right involving the acetabular roof and the anterior column.  No fracture of the femur is identified.  No other pelvic fracture.  No soft tissue hematoma.  IMPRESSION: Nondisplaced fractures of the acetabulum on the right affecting the acetabular roof and the anterior column.   Original Report Authenticated By: Paulina Fusi, M.D.   Dg Hip Portable 1 View Right  04/09/2013   *RADIOLOGY REPORT*  Clinical Data: Hip pain, fall  PORTABLE RIGHT HIP - 1 VIEW  Comparison: None.  Findings: Internal and external rotation views demonstrate no fracture or dislocation of the right femur.  There is arthritis of the hip joint with narrowing, osteophyte formation, and subchondral cystic change.There is a subtle focus of cortical irregularity involving the right inferior pubic ramus.  IMPRESSION: No hip fracture.  Possible subtle nondisplaced fracture inferior pubic ramus.  Consider CT if clinically appropriate.   Original Report Authenticated By: Esperanza Heir, M.D.    Review of Systems  Constitutional: Negative.   Eyes: Negative.   Respiratory: Negative.   Cardiovascular: Negative.   Gastrointestinal: Positive  for heartburn.  Genitourinary: Negative.   Musculoskeletal: Positive for back pain and joint pain.  Skin: Negative.   Neurological: Positive for headaches.  Endo/Heme/Allergies: Negative.   Psychiatric/Behavioral: Negative.    Blood pressure 146/90, pulse 68, temperature 98.5 F (36.9 C), temperature source Oral, resp. rate 16, SpO2 98.00%. Physical Exam  Constitutional: He is oriented to person, place, and time. He appears well-developed and well-nourished.  HENT:  Head: Atraumatic.  Eyes: Pupils are equal, round, and reactive to light.  Neck: Neck supple. No JVD present. No tracheal deviation present. No thyromegaly present.  Cardiovascular: Normal rate and intact distal pulses.   Respiratory: Effort normal and breath sounds normal. No respiratory distress. He has no wheezes.  GI: Soft. There is no tenderness. There is no guarding.  Musculoskeletal:       Right hip: He exhibits decreased range of motion, decreased strength, tenderness and bony tenderness. He exhibits no swelling, no deformity and no laceration.  Lymphadenopathy:    He has no cervical adenopathy.  Neurological: He is alert and oriented to person, place, and time. A sensory deficit (neuropathic pain on both distal legs) is present.  Skin: Skin is warm and dry.  Psychiatric: He has a normal mood and affect.    Assessment/Plan: Right non-displaced acetabular fracture  Non-operative fx Patient will be TDWB/NWB on the right LE Orthopaedcally stable Maintain pre-op meds as it is controlling pain. Follow up with  Dr. Charlann Boxer in 2 weeks.   Gerrit Halls 04/10/2013, 9:36 AM

## 2013-04-10 NOTE — Plan of Care (Signed)
Problem: Acute Rehab PT Goals Goal: PT Additional Goal #1 Pt will be able to propel himself independently in wheelchair >193ft

## 2013-04-10 NOTE — Evaluation (Signed)
Physical Therapy Evaluation Patient Details Name: [name] MRN: [id number] DOB: 1960-09-28 Today's Date: 04/10/2013 Time: 9528-4132 PT Time Calculation (min): 19 min  PT Assessment / Plan / Recommendation History of Present Illness  s/p fall from barstool resulting in Rt nondisplaced acetabular fx (non operative). PMH includes neuropathy, DM, HTN, alcoholism, cirrhosis and pancreatitis.   Clinical Impression  Patient is adm due to fall which resulted in nondisplaced Rt acetabular fx that is non-operative resulting in functional limitations due to the deficits listed below (see PT Problem List). Evaluation was limited due to patient's increased pain. Patient will benefit from skilled PT to increase their independence and safety with mobility to allow discharge to the venue listed below. Pt hopes to be transferred to [facility] as soon as possible. Requires max encouragement to participate in therapy.     PT Assessment  Patient needs continued PT services    Follow Up Recommendations  Supervision/Assistance - 24 hour;SNF    Does the patient have the potential to tolerate intense rehabilitation      Barriers to Discharge Decreased caregiver support wife is limited in amount she can (A) husband     Equipment Recommendations  Other (comment) (TBD )    Recommendations for Other Services OT consult   Frequency Min 3X/week    Precautions / Restrictions Precautions Precautions: Fall Restrictions Weight Bearing Restrictions: Yes RLE Weight Bearing: Touchdown weight bearing Other Position/Activity Restrictions: TDWB/NWB per MD note   Pertinent Vitals/Pain 10/10 Premedicated with IV pain medicine by RN       Mobility  Bed Mobility Bed Mobility: Supine to Sit;Scooting to HOB;Sit to Supine Supine to Sit: 1: +2 Total assist;HOB elevated (With trapeze bar ) Supine to Sit: Patient Percentage: 30% Sit to Supine: 1: +2 Total assist;HOB flat;Other (comment) (with trapeze) Sit to  Supine: Patient Percentage: 20% Scooting to HOB: 4: Min guard;With trapeze Details for Bed Mobility Assistance: pt unable to achieve upright sitting position; required (A) to advance Rt LE; with every advancement pt would yell out in pain; pt was able to bring Lt LE off bed independently; with HOB elevated to 40 degrees and (A) bringing trunk to EOB pt began to scream when attempting to bring Rt LE off bed and trunk to upright position and demanded to return to supine; pt requires (A) to bring trunk and Rt LE back to supine position with use of draw pad and pt is able to (A) with use of trapeze bar; pt able to WB through Lt LE and use trapeze bar to scoot himself up to [facility] with cues; increased time required for any advancement of Rt UE  Transfers Transfers: Not assessed Ambulation/Gait Ambulation/Gait Assistance: Not tested (comment) Stairs: No Wheelchair Mobility Wheelchair Mobility: No    Exercises General Exercises - Lower Extremity Ankle Circles/Pumps: 10 reps;AROM;Both;Supine   PT Diagnosis: Acute pain;Difficulty walking  PT Problem List: Decreased strength;Decreased range of motion;Decreased activity tolerance;Decreased balance;Decreased mobility;Decreased knowledge of use of DME;Decreased safety awareness;Decreased knowledge of precautions;Pain PT Treatment Interventions: DME instruction;Gait training;Functional mobility training;Therapeutic activities;Balance training;Therapeutic exercise;Neuromuscular re-education;Patient/family education;Wheelchair mobility training     PT Goals(Current goals can be found in the care plan section) Acute Rehab PT Goals Patient Stated Goal: to go to [place] [facility] in [place] PT Goal Formulation: With patient Time For Goal Achievement: 04/17/13 Potential to Achieve Goals: Good  Visit Information  Last PT Received On: 04/10/13 Assistance Needed: +2 History of Present Illness: s/p fall from barstool resulting in Rt nondisplaced acetabular fx (non  operative). PMH includes neuropathy, DM, HTN, alcoholism, cirrhosis and pancreatitis.        Prior Functioning  Home Living Family/patient expects to be discharged to:: Skilled nursing facility Living Arrangements: Spouse/significant other Available Help at Discharge: Skilled Nursing Facility Prior Function Level of Independence: Independent with assistive device(s) Comments: pt stated he would use wheelchair for long distances; was a household ambulator with SPC or "furniture walking" Communication Communication: No difficulties    Cognition  Cognition Arousal/Alertness: Awake/alert Behavior During Therapy: Anxious Overall Cognitive Status: Within Functional Limits for tasks assessed    Extremity/Trunk Assessment Upper Extremity Assessment Upper Extremity Assessment: Overall WFL for tasks assessed Lower Extremity Assessment Lower Extremity Assessment: RLE deficits/detail RLE: Unable to fully assess due to pain RLE Sensation: history of peripheral neuropathy (pt began to scream when donning his Rt sock) Cervical / Trunk Assessment Cervical / Trunk Assessment: Normal   Balance Balance Balance Assessed: No  End of Session PT - End of Session Activity Tolerance: Patient limited by pain Patient left: in bed;with call bell/phone within reach Nurse Communication: Mobility status  GP     Donell Sievert, [place] [phone] 04/10/2013, 3:24 PM

## 2013-04-10 NOTE — Progress Notes (Signed)
Pt has refused to be turned or re-positioned at all. Will not allow staff to even remove wadded up blankets from underneath him.Pt enc to allow but has refused.Has been cautioned about skin breakdown and other possible implications from not moving or allowing assistance.Linward Headland D

## 2013-04-11 DIAGNOSIS — M810 Age-related osteoporosis without current pathological fracture: Secondary | ICD-10-CM

## 2013-04-11 DIAGNOSIS — M545 Low back pain: Secondary | ICD-10-CM

## 2013-04-11 LAB — CBC WITH DIFFERENTIAL/PLATELET
Basophils Relative: 0 % (ref 0–1)
Eosinophils Absolute: 0.1 10*3/uL (ref 0.0–0.7)
Eosinophils Relative: 1 % (ref 0–5)
Lymphs Abs: 1.5 10*3/uL (ref 0.7–4.0)
MCH: 34.5 pg — ABNORMAL HIGH (ref 26.0–34.0)
MCHC: 35.1 g/dL (ref 30.0–36.0)
MCV: 98.4 fL (ref 78.0–100.0)
Monocytes Relative: 10 % (ref 3–12)
Platelets: 118 10*3/uL — ABNORMAL LOW (ref 150–400)
RBC: 4.32 MIL/uL (ref 4.22–5.81)

## 2013-04-11 LAB — GLUCOSE, CAPILLARY
Glucose-Capillary: 132 mg/dL — ABNORMAL HIGH (ref 70–99)
Glucose-Capillary: 130 mg/dL — ABNORMAL HIGH (ref 70–99)

## 2013-04-11 MED ORDER — GABAPENTIN 400 MG PO CAPS
1200.0000 mg | ORAL_CAPSULE | Freq: Three times a day (TID) | ORAL | Status: DC
  Administered 2013-04-11 – 2013-04-19 (×25): 1200 mg via ORAL
  Filled 2013-04-11 (×27): qty 3

## 2013-04-11 MED ORDER — INSULIN ASPART 100 UNIT/ML ~~LOC~~ SOLN
5.0000 [IU] | Freq: Three times a day (TID) | SUBCUTANEOUS | Status: DC
  Administered 2013-04-11 – 2013-04-12 (×3): 5 [IU] via SUBCUTANEOUS

## 2013-04-11 MED ORDER — INSULIN GLARGINE 100 UNIT/ML ~~LOC~~ SOLN
33.0000 [IU] | Freq: Every day | SUBCUTANEOUS | Status: DC
  Administered 2013-04-11 – 2013-04-12 (×2): 33 [IU] via SUBCUTANEOUS
  Filled 2013-04-11 (×2): qty 0.33

## 2013-04-11 NOTE — Progress Notes (Signed)
UR COMPLETED  

## 2013-04-11 NOTE — Progress Notes (Signed)
Physical Therapy Treatment Patient Details Name: [name] MRN: [id number] DOB: 1961/01/29 Today's Date: 04/11/2013 Time: 4696-2952 PT Time Calculation (min): 20 min  PT Assessment / Plan / Recommendation  History of Present Illness s/p fall from barstool resulting in Rt nondisplaced acetabular fx (non operative). PMH includes neuropathy, DM, HTN, alcoholism, cirrhosis and pancreatitis.    PT Comments   Pt is limited greatly in therapy due to pain. Pt unable to come to upright sitting position on EOB after multiple attempts this session. Pt demo ability to actively range Rt LE; however, is self-limiting when attempting mobility with 2+(A). Pt became highly agitated with therapy today and yelled out vulgarities while kicking and throwing trapeze bar. Pt stated he [facility] not getting [facility] [place] Pt educated on benefits of mobility and exercises, pt adamantly refuses and is unpleasant towards staff. Pt was premedicated prior to therapy session.   Follow Up Recommendations  Supervision/Assistance - 24 hour;SNF;Other (comment) (Rehab at [facility]; per pt )     Does the patient have the potential to tolerate intense rehabilitation     Barriers to Discharge        Equipment Recommendations  Other (comment) (TBD)    Recommendations for Other Services OT consult  Frequency Min 3X/week   Progress towards PT Goals Progress towards PT goals: Not progressing toward goals - comment (self-limited and refusing to progress due to pain)  Plan Current plan remains appropriate    Precautions / Restrictions Precautions Precautions: Fall Restrictions Weight Bearing Restrictions: Yes RLE Weight Bearing: Touchdown weight bearing Other Position/Activity Restrictions: TDWB/NWB per MD note   Pertinent Vitals/Pain 10/10     Mobility  Bed Mobility Bed Mobility: Supine to Sit;Sitting - Scoot to Edge of Bed Supine to Sit: 1: +2 Total assist;HOB elevated;With rails;Other  (comment) (with trapeze) Supine to Sit: Patient Percentage: 10% Sitting - Scoot to Edge of Bed: 1: +2 Total assist (attempted but unable to complete ) Scooting to [facility]: 5: Supervision (with trapeze) Details for Bed Mobility Assistance: attempted supine to sit x3; first time to the Lt side of bed; pt was able to independently advance Rt LE to EOB with increased time; pt then began to yell out in pain without being (A) at all and refused to attempt to get OOB on Lt side; then attempted supine to sit with 2+ (A) with use of draw pad x 2; each time when the EOB was reached with LEs supported pt became agitated and began to kick and throw trapeze bar while yelling out in pain. Pt refused to attempted anymore sitting techniques; was able to (A) himself to [facility] with trapeze bar; HOB was elvated to 45 degrees; pt unable to come to upright sitting position in long sitting in bed also due to pain; pt requires increased time and max encouragement to attempt any mobility   Transfers Transfers: Not assessed Ambulation/Gait Ambulation/Gait Assistance: Not tested (comment) Stairs: No Wheelchair Mobility Wheelchair Mobility: No    Exercises General Exercises - Lower Extremity Ankle Circles/Pumps: 10 reps;AROM;Both;Supine Heel Slides: Right;AROM;5 reps (full ROM of knee demo with AROM heel slide; no c/o pain)   PT Diagnosis:    PT Problem List:   PT Treatment Interventions:     PT Goals (current goals can now be found in the care plan section) Acute Rehab PT Goals Patient Stated Goal: to go to [place] [facility] in [place] PT Goal Formulation: With patient Time For Goal Achievement: 04/17/13 Potential to  Achieve Goals: Fair  Visit Information  Last PT Received On: 04/11/13 Assistance Needed: +2 History of Present Illness: s/p fall from barstool resulting in Rt nondisplaced acetabular fx (non operative). PMH includes neuropathy, DM, HTN, alcoholism, cirrhosis and pancreatitis.     Subjective Data  Subjective: pt  lying supine; requries max encouragement to participate in therapy.  Patient Stated Goal: to go to [place] re[facility]  Cognition Arousal/Alertness: Awake/alert Behavior During Therapy: Anxious;Agitated Overall Cognitive Status: Within Functional Limits for tasks assessed    Balance  Balance Balance Assessed: No  End of Session PT - End of Session Activity Tolerance: Patient limited by pain Patient left: in bed;with call bell/phone within reach Nurse Communication: Mobility status;Patient requests pain meds   GP     Donell Sievert, [place] [phone] 04/11/2013, 1:58 PM

## 2013-04-11 NOTE — Progress Notes (Signed)
Pt has stated that PT has hurt him and he can not bend his legs now.  After RN in room to give medications, pt is unaware of his right leg is bent towards his torso while he is speaking to RN.  Pt has now lowered his leg back to resting position with no c/o pain, grimacing or whining.  Nsg to continue to follow.

## 2013-04-11 NOTE — Progress Notes (Signed)
Patient is requesting an acute to acute transfer to the [facility]. The number given for the AOD is [id number]   Sabino Niemann, MSW, [phone]

## 2013-04-11 NOTE — Progress Notes (Addendum)
Patient ID: [name]  male  AVW:[id number]    DOB: [birth year]    DOA: 04/09/2013  PCP: [name], MD  Assessment/Plan: Principal Problem:   Right acetabulum fracture -  nonoperative fracture per orthopedics -  pain control and physical therapy as per orthopedics recommendations  - Physical therapy evaluation recommends skilled nursing facility or 24/7 supervision - Check vitamin D levels  Chronic pain syndrome now worsened due to right acetabulum fracture  - Continue home dose of MS Contin 60 mg BID, oxycodone IR 15 q4hrs PRN, Neurontin, baclofen, IV dilaudid PRN - Verified by the pharmacy that patient is on Neurontin 1200mg TID and rec'ed to watch for over-sedation but ok to restart it - Bowel regimen and PT - I am questioning about the drug seeking behavior especially with the nursing assessment. He appeared significantly better today, prefers the IV pain medication. We'll continue to monitor closely and at this time  will not increase any pain medications.    HTN; controlled - Continue home medication    DM type 2- poorly controlled inpatient, hemoglobin A1c is 7.6 outpatient -  Placed on meal coverage 5 units tid, increase the Lantus to 33 units, continue moderate sliding scale  Hyperlipidemia: LDL 56, cholesterol 139  DVT Prophylaxis: lovenox  Code Status:  Disposition:    Subjective: Complaining of pain in his right hip, somewhat better today   Objective: Weight change:   Intake/Output Summary (Last 24 hours) at 04/11/13 1140 Last data filed at 04/10/13 2300  Gross per 24 hour  Intake      0 ml  Output    700 ml  Net   -700 ml   Blood pressure 130/77, pulse 74, temperature 98 F (36.7 C), temperature source Oral, resp. rate 16, SpO2 96.00%.  Physical Exam: General: Alert and awake, oriented x3, NAD CVS: S1-S2 clear, no murmur rubs or gallops Chest: CTAB Abdomen: soft NT, ND, NBS  Extremities: no c/c/e   Lab Results: Basic Metabolic  Panel:  Recent Labs Lab 04/09/13 0932  NA 138  K 4.3  CL 102  CO2 26  GLUCOSE 68*  BUN 9  CREATININE 0.58  CALCIUM 8.9   Liver Function Tests:  Recent Labs Lab 04/09/13 0932  AST 85*  ALT 48  ALKPHOS 265*  BILITOT 0.7  PROT 6.2  ALBUMIN 3.3*   No results found for this basename: LIPASE, AMYLASE,  in the last 168 hours No results found for this basename: AMMONIA,  in the last 168 hours CBC:  Recent Labs Lab 04/10/13 0622 04/11/13 0628  WBC 7.3 8.5  NEUTROABS 5.4 6.0  HGB 14.4 14.9  HCT 41.7 42.5  MCV 100.0 98.4  PLT 105* 118*   Cardiac Enzymes: No results found for this basename: CKTOTAL, CKMB, CKMBINDEX, TROPONINI,  in the last 168 hours BNP: No components found with this basename: POCBNP,  CBG:  Recent Labs Lab 04/10/13 1221 04/10/13 1632 04/10/13 2349 04/11/13 0651 04/11/13 1051  GLUCAP 210* 250* 299* 281* 334*     Micro Results: No results found for this or any previous visit (from the past 240 hour(s)).  Studies/Results: Ct Lumbar Spine Wo Contrast  04/09/2013   *RADIOLOGY REPORT*  Clinical data:  The fall with back and right hip pain.  Negative radiographs.  CT LUMBAR SPINE WITHOUT CONTRAST  Technique:  Multidetector CT imaging of the lumbar spine was performed without intravenous contrast administration.  Multiplanar CT image reconstructions were also generated.  Comparison:  Radiography same day.  CT  abdomen 08/06/2007.  Findings:  Alignment is normal.  There are old Schmorl's node defects at the superior endplate of T12.  There are newly seen but chronic appearing Schmorl's node defects at the inferior end plate of L5, more on the right than the left.  These are chronic based on surrounding sclerotic change.  No acute fracture of the spine is evident.  There are some degenerative changes of the sacroiliac joints, right more than left.  There is bulging of the disc at L4- 5.  There is shallow broad-based protrusion of the disc at L5-S1. There is  mild lower lumbar facet arthropathy.  There is atherosclerosis of the aorta and its branch vessels but no evidence of aneurysm.  IMPRESSION: No acute finding in the lumbar region.  Chronic end plate Schmorl's nodes at T12 and L5.  Sacroiliac degeneration right more than left.  Disc bulge at L4-5.  Shallow disc protrusion at L5-S1.  CT PELVIS WITHOUT CONTRAST  Technique:  Multidetector CT imaging of the pelvis was performed following the standard protocol without intravenous contrast.   Findings:  There is a nondisplaced acetabular fracture on the right involving the acetabular roof and the anterior column.  No fracture of the femur is identified.  No other pelvic fracture.  No soft tissue hematoma.  IMPRESSION: Nondisplaced fractures of the acetabulum on the right affecting the acetabular roof and the anterior column.   Original Report Authenticated By: Paulina Fusi, M.D.   Ct Pelvis Wo Contrast  04/09/2013   *RADIOLOGY REPORT*  Clinical data:  The fall with back and right hip pain.  Negative radiographs.  CT LUMBAR SPINE WITHOUT CONTRAST  Technique:  Multidetector CT imaging of the lumbar spine was performed without intravenous contrast administration.  Multiplanar CT image reconstructions were also generated.  Comparison:  Radiography same day.  CT abdomen 08/06/2007.  Findings:  Alignment is normal.  There are old Schmorl's node defects at the superior endplate of T12.  There are newly seen but chronic appearing Schmorl's node defects at the inferior end plate of L5, more on the right than the left.  These are chronic based on surrounding sclerotic change.  No acute fracture of the spine is evident.  There are some degenerative changes of the sacroiliac joints, right more than left.  There is bulging of the disc at L4- 5.  There is shallow broad-based protrusion of the disc at L5-S1. There is mild lower lumbar facet arthropathy.  There is atherosclerosis of the aorta and its branch vessels but no evidence of  aneurysm.  IMPRESSION: No acute finding in the lumbar region.  Chronic end plate Schmorl's nodes at T12 and L5.  Sacroiliac degeneration right more than left.  Disc bulge at L4-5.  Shallow disc protrusion at L5-S1.  CT PELVIS WITHOUT CONTRAST  Technique:  Multidetector CT imaging of the pelvis was performed following the standard protocol without intravenous contrast.   Findings:  There is a nondisplaced acetabular fracture on the right involving the acetabular roof and the anterior column.  No fracture of the femur is identified.  No other pelvic fracture.  No soft tissue hematoma.  IMPRESSION: Nondisplaced fractures of the acetabulum on the right affecting the acetabular roof and the anterior column.   Original Report Authenticated By: Paulina Fusi, M.D.   Dg Hip Portable 1 View Right  04/09/2013   *RADIOLOGY REPORT*  Clinical Data: Hip pain, fall  PORTABLE RIGHT HIP - 1 VIEW  Comparison: None.  Findings: Internal and external rotation views  demonstrate no fracture or dislocation of the right femur.  There is arthritis of the hip joint with narrowing, osteophyte formation, and subchondral cystic change.There is a subtle focus of cortical irregularity involving the right inferior pubic ramus.  IMPRESSION: No hip fracture.  Possible subtle nondisplaced fracture inferior pubic ramus.  Consider CT if clinically appropriate.   Original Report Authenticated By: Esperanza Heir, M.D.    Medications: Scheduled Meds: . baclofen  10 mg Oral TID  . docusate sodium  100 mg Oral BID  . enoxaparin (LOVENOX) injection  40 mg Subcutaneous Q24H  . ergocalciferol  50,000 Units Oral Weekly  . gabapentin  600 mg Oral TID  . insulin aspart  0-15 Units Subcutaneous TID WC  . insulin aspart  0-5 Units Subcutaneous QHS  . insulin aspart  5 Units Subcutaneous TID WC  . insulin glargine  33 Units Subcutaneous Daily  . morphine  60 mg Oral BID  . pantoprazole  20 mg Oral Daily  . ramipril  10 mg Oral Daily  . salsalate   750 mg Oral BID  . senna  1 tablet Oral BID  . sertraline  200 mg Oral Daily  . cyanocobalamin  100 mcg Oral Daily      LOS: 2 days   RAI,RIPUDEEP M.D. [facility] 04/11/2013, 11:40 AM Pager: [phone]  If 7PM-7AM, please contact night-coverage [email or web address] Password [email or web address]

## 2013-04-11 NOTE — Progress Notes (Signed)
Pt now has right leg bent towards torso with no s/s of pain other than verbal complaints.  Asking for pain medications.

## 2013-04-12 DIAGNOSIS — R262 Difficulty in walking, not elsewhere classified: Secondary | ICD-10-CM

## 2013-04-12 DIAGNOSIS — K219 Gastro-esophageal reflux disease without esophagitis: Secondary | ICD-10-CM

## 2013-04-12 DIAGNOSIS — R609 Edema, unspecified: Secondary | ICD-10-CM

## 2013-04-12 LAB — CBC
MCV: 99.5 fL (ref 78.0–100.0)
Platelets: 137 10*3/uL — ABNORMAL LOW (ref 150–400)
RBC: 4.44 MIL/uL (ref 4.22–5.81)
RDW: 13.7 % (ref 11.5–15.5)
WBC: 10 10*3/uL (ref 4.0–10.5)

## 2013-04-12 LAB — VITAMIN D 25 HYDROXY (VIT D DEFICIENCY, FRACTURES): Vit D, 25-Hydroxy: 13 ng/mL — ABNORMAL LOW (ref 30–89)

## 2013-04-12 LAB — URINE DRUGS OF ABUSE SCREEN W ALC, ROUTINE (REF LAB)
Benzodiazepines.: NEGATIVE
Cocaine Metabolites: NEGATIVE
Methadone: NEGATIVE
Phencyclidine (PCP): NEGATIVE
Propoxyphene: NEGATIVE

## 2013-04-12 LAB — GLUCOSE, CAPILLARY: Glucose-Capillary: 256 mg/dL — ABNORMAL HIGH (ref 70–99)

## 2013-04-12 MED ORDER — INSULIN GLARGINE 100 UNIT/ML ~~LOC~~ SOLN
35.0000 [IU] | Freq: Every day | SUBCUTANEOUS | Status: DC
  Administered 2013-04-13: 35 [IU] via SUBCUTANEOUS
  Filled 2013-04-12: qty 0.35

## 2013-04-12 MED ORDER — INSULIN ASPART 100 UNIT/ML ~~LOC~~ SOLN
7.0000 [IU] | Freq: Three times a day (TID) | SUBCUTANEOUS | Status: DC
  Administered 2013-04-12 – 2013-04-13 (×3): 7 [IU] via SUBCUTANEOUS

## 2013-04-12 MED ORDER — HYDROMORPHONE HCL PF 1 MG/ML IJ SOLN
2.0000 mg | INTRAMUSCULAR | Status: DC | PRN
  Administered 2013-04-12 – 2013-04-18 (×45): 2 mg via INTRAVENOUS
  Administered 2013-04-19: 1 mg via INTRAVENOUS
  Administered 2013-04-19: 2 mg via INTRAVENOUS
  Administered 2013-04-19 (×2): 1 mg via INTRAVENOUS
  Administered 2013-04-19 (×2): 2 mg via INTRAVENOUS
  Filled 2013-04-12 (×13): qty 2
  Filled 2013-04-12: qty 1
  Filled 2013-04-12: qty 2
  Filled 2013-04-12: qty 1
  Filled 2013-04-12 (×22): qty 2
  Filled 2013-04-12 (×2): qty 1
  Filled 2013-04-12 (×3): qty 2
  Filled 2013-04-12: qty 1
  Filled 2013-04-12 (×10): qty 2

## 2013-04-12 NOTE — Progress Notes (Signed)
Physical Therapy Treatment Patient Details Name: GEOVANIE WINNETT MRN: [id number] DOB: 1961/01/21 Today's Date: 04/12/2013 Time: 4098-1191 PT Time Calculation (min): 25 min  PT Assessment / Plan / Recommendation  History of Present Illness s/p fall from barstool resulting in Rt nondisplaced acetabular fx (non operative). PMH includes neuropathy, DM, HTN, alcoholism, cirrhosis and pancreatitis.    PT Comments   Upon arrival pt and spouse talking with MD.  Plan to have MRI to r/o neurological involvement based on pt description of electric-like pain with any active movement of affected limb.  Mobility deferred today until MRI results available.  Educated pt on ice modality to manage pain and edema, provided ice pack to R hip with instruction for 20-30 min on/at least 30 min off, repeat as needed.  Instructed on use of bed controls to alter position in bed for pressure relief due to inability to actively change position and to tolerate passive repositioning without excruciating pain.  Pt and spouse educated on flattening bed for position changes and benefit of even minimal shift to non-affected side with small pillow under affected hip as able...  Educated on AROM and AAROM for ankles, knees and hips within pain-tolerable ROM and to include self-assist in order to maintain joint ROM, manage edema, and preserve muscle function for mobility.  Pt and wife very appreciative of this patient-centered, collaborative approach to his care and agreeable to continuing PT once MRI results are available to guide rehab decisions.  Plan to f/u next date and progress at appropriate pace.   Follow Up Recommendations        Does the patient have the potential to tolerate intense rehabilitation     Barriers to Discharge        Equipment Recommendations       Recommendations for Other Services    Frequency Min 3X/week   Progress towards PT Goals Progress towards PT goals: Not progressing toward goals - comment (to  have MRI to r/o neurological involvement)  Plan Current plan remains appropriate    Precautions / Restrictions Restrictions Weight Bearing Restrictions: Yes RLE Weight Bearing: Touchdown weight bearing Other Position/Activity Restrictions: TDWB/NWB per MD note   Pertinent Vitals/Pain Immediate electric-like pain with minimal movement noted with SAQ of right LE.  Terminated activity and educated on pain-free and pain-tolerable ROM benefits.    Mobility  Bed Mobility Bed Mobility: Not assessed Transfers Transfers: Not assessed Ambulation/Gait Ambulation/Gait Assistance: Not tested (comment)    Exercises General Exercises - Lower Extremity Ankle Circles/Pumps: 10 reps;AROM;Both;Supine Quad Sets: AROM;Both;10 reps;Supine Gluteal Sets: AROM;Both;5 reps;10 reps;Supine Short Arc Quad: AROM;Both;10 reps;Supine;Limitations (immediate shooting electric pain with right SAQ) Heel Slides: AAROM;Right;5 reps;Supine   PT Diagnosis:    PT Problem List:   PT Treatment Interventions:     PT Goals (current goals can now be found in the care plan section)    Visit Information  Last PT Received On: 04/12/13 History of Present Illness: s/p fall from barstool resulting in Rt nondisplaced acetabular fx (non operative). PMH includes neuropathy, DM, HTN, alcoholism, cirrhosis and pancreatitis.     Subjective Data  Subjective: motivated to progress, however in excrutiating pain with any voluntary movement of hip musculature   Cognition  Cognition Arousal/Alertness: Awake/alert Behavior During Therapy: WFL for tasks assessed/performed Overall Cognitive Status: Within Functional Limits for tasks assessed    Balance     End of Session PT - End of Session Activity Tolerance: Patient limited by pain Patient left: in bed;with call bell/phone within reach;with family/visitor  present Nurse Communication: Mobility status   GP     Dennis Bast 04/12/2013, 9:37 AM

## 2013-04-12 NOTE — Progress Notes (Signed)
Patient ID: [name]  male  ZOX:[id number]    DOB: [birth year]    DOA: 04/09/2013  PCP: [name], MD  Assessment/Plan: Principal Problem:   Right acetabulum fracture, acute on chronic low back pain -  nonoperative fracture per orthopedics -  pain control and physical therapy as per orthopedics recommendations  - Physical therapy evaluation recommends skilled nursing facility/rehab - Patient today complaining of neuropathic pain radiating from the lower number spine towards the right hip and knee. Ordered MRI of the lumbar spine to rule out any ruptured disc, nerve root compression or impingement.  Vitamin D deficiency - Continue vitamin D supplementation  Chronic pain syndrome now worsened due to right acetabulum fracture  - Continue home dose of MS Contin 60 mg BID, oxycodone IR 15 q4hrs PRN, Neurontin, baclofen, IV dilaudid PRN - Bowel regimen and PT - Dilaudid increased to 2 mg IV q3hrs PRN    HTN; controlled - Continue home medication    DM type 2- poorly controlled inpatient, hemoglobin A1c is 7.6 outpatient -  Inc meal coverage 7 units tid, increase the Lantus to 35 units, continue moderate sliding scale  Hyperlipidemia: LDL 56, cholesterol 139  DVT Prophylaxis: lovenox  Code Status:  Disposition: Plan discussed in detail with patient and his wife, agreeable with current management. Patient agrees to work with physical therapy after the MRI results.   Subjective: Complaining of pain in his right hip, lower back pain, radiating down, burning in his feet. (Apologetic for yesterday's behavior, states was frustrated  and thought that he was being released from the hospital when he talked to the social worker). Wife at the bedside.  Objective: Weight change:   Intake/Output Summary (Last 24 hours) at 04/12/13 1302 Last data filed at 04/12/13 1100  Gross per 24 hour  Intake    600 ml  Output    651 ml  Net    -51 ml   Blood pressure 114/94, pulse 86,  temperature 98 F (36.7 C), temperature source Oral, resp. rate 16, SpO2 95.00%.  Physical Exam: General: A x O x3, NAD CVS: S1-S2 clear, no murmur rubs or gallops Chest: CTAB Abdomen: soft NT, ND, NBS  Extremities: no c/c/e   Lab Results: Basic Metabolic Panel:  Recent Labs Lab 04/09/13 0932  NA 138  K 4.3  CL 102  CO2 26  GLUCOSE 68*  BUN 9  CREATININE 0.58  CALCIUM 8.9   Liver Function Tests:  Recent Labs Lab 04/09/13 0932  AST 85*  ALT 48  ALKPHOS 265*  BILITOT 0.7  PROT 6.2  ALBUMIN 3.3*   No results found for this basename: LIPASE, AMYLASE,  in the last 168 hours No results found for this basename: AMMONIA,  in the last 168 hours CBC:  Recent Labs Lab 04/11/13 0628 04/12/13 0430  WBC 8.5 10.0  NEUTROABS 6.0  --   HGB 14.9 15.3  HCT 42.5 44.2  MCV 98.4 99.5  PLT 118* 137*   Cardiac Enzymes: No results found for this basename: CKTOTAL, CKMB, CKMBINDEX, TROPONINI,  in the last 168 hours BNP: No components found with this basename: POCBNP,  CBG:  Recent Labs Lab 04/11/13 1655 04/11/13 2123 04/12/13 0328 04/12/13 0655 04/12/13 1058  GLUCAP 132* 130* 256* 230* 170*     Micro Results: No results found for this or any previous visit (from the past 240 hour(s)).  Studies/Results: Ct Lumbar Spine Wo Contrast  04/09/2013   *RADIOLOGY REPORT*  Clinical data:  The fall with  back and right hip pain.  Negative radiographs.  CT LUMBAR SPINE WITHOUT CONTRAST  Technique:  Multidetector CT imaging of the lumbar spine was performed without intravenous contrast administration.  Multiplanar CT image reconstructions were also generated.  Comparison:  Radiography same day.  CT abdomen 08/06/2007.  Findings:  Alignment is normal.  There are old Schmorl's node defects at the superior endplate of T12.  There are newly seen but chronic appearing Schmorl's node defects at the inferior end plate of L5, more on the right than the left.  These are chronic based on  surrounding sclerotic change.  No acute fracture of the spine is evident.  There are some degenerative changes of the sacroiliac joints, right more than left.  There is bulging of the disc at L4- 5.  There is shallow broad-based protrusion of the disc at L5-S1. There is mild lower lumbar facet arthropathy.  There is atherosclerosis of the aorta and its branch vessels but no evidence of aneurysm.  IMPRESSION: No acute finding in the lumbar region.  Chronic end plate Schmorl's nodes at T12 and L5.  Sacroiliac degeneration right more than left.  Disc bulge at L4-5.  Shallow disc protrusion at L5-S1.  CT PELVIS WITHOUT CONTRAST  Technique:  Multidetector CT imaging of the pelvis was performed following the standard protocol without intravenous contrast.   Findings:  There is a nondisplaced acetabular fracture on the right involving the acetabular roof and the anterior column.  No fracture of the femur is identified.  No other pelvic fracture.  No soft tissue hematoma.  IMPRESSION: Nondisplaced fractures of the acetabulum on the right affecting the acetabular roof and the anterior column.   Original Report Authenticated By: Paulina Fusi, M.D.   Ct Pelvis Wo Contrast  04/09/2013   *RADIOLOGY REPORT*  Clinical data:  The fall with back and right hip pain.  Negative radiographs.  CT LUMBAR SPINE WITHOUT CONTRAST  Technique:  Multidetector CT imaging of the lumbar spine was performed without intravenous contrast administration.  Multiplanar CT image reconstructions were also generated.  Comparison:  Radiography same day.  CT abdomen 08/06/2007.  Findings:  Alignment is normal.  There are old Schmorl's node defects at the superior endplate of T12.  There are newly seen but chronic appearing Schmorl's node defects at the inferior end plate of L5, more on the right than the left.  These are chronic based on surrounding sclerotic change.  No acute fracture of the spine is evident.  There are some degenerative changes of the  sacroiliac joints, right more than left.  There is bulging of the disc at L4- 5.  There is shallow broad-based protrusion of the disc at L5-S1. There is mild lower lumbar facet arthropathy.  There is atherosclerosis of the aorta and its branch vessels but no evidence of aneurysm.  IMPRESSION: No acute finding in the lumbar region.  Chronic end plate Schmorl's nodes at T12 and L5.  Sacroiliac degeneration right more than left.  Disc bulge at L4-5.  Shallow disc protrusion at L5-S1.  CT PELVIS WITHOUT CONTRAST  Technique:  Multidetector CT imaging of the pelvis was performed following the standard protocol without intravenous contrast.   Findings:  There is a nondisplaced acetabular fracture on the right involving the acetabular roof and the anterior column.  No fracture of the femur is identified.  No other pelvic fracture.  No soft tissue hematoma.  IMPRESSION: Nondisplaced fractures of the acetabulum on the right affecting the acetabular roof and the anterior column.  Original Report Authenticated By: Paulina Fusi, M.D.   Dg Hip Portable 1 View Right  04/09/2013   *RADIOLOGY REPORT*  Clinical Data: Hip pain, fall  PORTABLE RIGHT HIP - 1 VIEW  Comparison: None.  Findings: Internal and external rotation views demonstrate no fracture or dislocation of the right femur.  There is arthritis of the hip joint with narrowing, osteophyte formation, and subchondral cystic change.There is a subtle focus of cortical irregularity involving the right inferior pubic ramus.  IMPRESSION: No hip fracture.  Possible subtle nondisplaced fracture inferior pubic ramus.  Consider CT if clinically appropriate.   Original Report Authenticated By: Esperanza Heir, M.D.    Medications: Scheduled Meds: . baclofen  10 mg Oral TID  . docusate sodium  100 mg Oral BID  . enoxaparin (LOVENOX) injection  40 mg Subcutaneous Q24H  . ergocalciferol  50,000 Units Oral Weekly  . gabapentin  1,200 mg Oral TID  . insulin aspart  0-15 Units  Subcutaneous TID WC  . insulin aspart  0-5 Units Subcutaneous QHS  . insulin aspart  5 Units Subcutaneous TID WC  . insulin glargine  33 Units Subcutaneous Daily  . morphine  60 mg Oral BID  . pantoprazole  20 mg Oral Daily  . ramipril  10 mg Oral Daily  . salsalate  750 mg Oral BID  . senna  1 tablet Oral BID  . sertraline  200 mg Oral Daily  . cyanocobalamin  100 mcg Oral Daily      LOS: 3 days   RAI,RIPUDEEP M.D. [facility] 04/12/2013, 1:02 PM Pager: [phone]  If 7PM-7AM, please contact night-coverage [email or web address] Password [email or web address]

## 2013-04-13 ENCOUNTER — Inpatient Hospital Stay (HOSPITAL_COMMUNITY): Payer: BC Managed Care – PPO

## 2013-04-13 ENCOUNTER — Encounter (HOSPITAL_COMMUNITY): Payer: Self-pay | Admitting: Radiology

## 2013-04-13 DIAGNOSIS — E559 Vitamin D deficiency, unspecified: Secondary | ICD-10-CM

## 2013-04-13 DIAGNOSIS — R52 Pain, unspecified: Secondary | ICD-10-CM

## 2013-04-13 LAB — GLUCOSE, CAPILLARY

## 2013-04-13 LAB — CBC
HCT: 42.4 % (ref 39.0–52.0)
Hemoglobin: 14.2 g/dL (ref 13.0–17.0)
MCH: 33.9 pg (ref 26.0–34.0)
MCV: 101.2 fL — ABNORMAL HIGH (ref 78.0–100.0)
RBC: 4.19 MIL/uL — ABNORMAL LOW (ref 4.22–5.81)
WBC: 9.4 10*3/uL (ref 4.0–10.5)

## 2013-04-13 MED ORDER — INSULIN GLARGINE 100 UNIT/ML ~~LOC~~ SOLN: 40.0000 [IU] | Freq: Every day | SUBCUTANEOUS | Status: DC

## 2013-04-13 MED ORDER — ENOXAPARIN SODIUM 40 MG/0.4ML ~~LOC~~ SOLN
40.0000 mg | SUBCUTANEOUS | Status: DC
  Administered 2013-04-15 – 2013-04-19 (×5): 40 mg via SUBCUTANEOUS
  Filled 2013-04-13 (×6): qty 0.4

## 2013-04-13 MED ORDER — INSULIN GLARGINE 100 UNIT/ML ~~LOC~~ SOLN
37.0000 [IU] | Freq: Every day | SUBCUTANEOUS | Status: DC
  Filled 2013-04-13: qty 0.37

## 2013-04-13 MED ORDER — INSULIN ASPART 100 UNIT/ML ~~LOC~~ SOLN
10.0000 [IU] | Freq: Three times a day (TID) | SUBCUTANEOUS | Status: DC
  Administered 2013-04-13 – 2013-04-15 (×3): 10 [IU] via SUBCUTANEOUS

## 2013-04-13 MED ORDER — GADOBENATE DIMEGLUMINE 529 MG/ML IV SOLN
15.0000 mL | Freq: Once | INTRAVENOUS | Status: AC
  Administered 2013-04-13: 15 mL via INTRAVENOUS

## 2013-04-13 NOTE — H&P (Signed)
[name] is an 52 y.o. male.   Chief Complaint: Pt suffered fall at home 8/16; to ER New Rt acetabular fracture Non operative per Ortho Scheduled for SNF and rehab upon discharge  Acute on chronic back pain- worsening x few months Pain in low back to Rt hip to Rt knee to Rt foot- this pain has been evident since before new fx. Pt has hx back pain- epidural steroid injections x 3 in 1993- relief for a while- subsequently underwent laminectomy 1993 which helped back pain for yrs. Developed back pain again in 2008- new surgery but did not relieve pain as well nor as long  Request now for epidural steroid injection in IR MRI performed 8/19  HPI: DM; HTN; Osteoporosis; pancreatitis; back pain  Past Medical History  Diagnosis Date  . Diabetes mellitus   . Hypertension   . GERD (gastroesophageal reflux disease)   . Osteoporosis   . Migraine   . Chronic lower back pain   . Vitamin D deficiency   . Hepatomegaly   . Pancreatitis     Past Surgical History  Procedure Laterality Date  . Cholecystectomy    . Hernia repair    . Laminectomy      Family History  Problem Relation Age of Onset  . Cancer      breast/grandmother, prostate/grandfather   Social History:  reports that he has quit smoking. He does not have any smokeless tobacco history on file. He reports that he does not drink alcohol or use illicit drugs.  Allergies:  Allergies  Allergen Reactions  . Ketorolac Tromethamine     Hives (toradol)    Medications Prior to Admission  Medication Sig Dispense Refill  . baclofen (LIORESAL) 10 MG tablet Take 10 mg by mouth 3 (three) times daily.      . cyanocobalamin 1000 MCG tablet Take 100 mcg by mouth daily.      . ergocalciferol (VITAMIN D2) 50000 UNITS capsule Take 50,000 Units by mouth once a week. On Saturday      . gabapentin (NEURONTIN) 400 MG capsule Take 1,200 mg by mouth 3 (three) times daily.      . insulin aspart (NOVOLOG) 100 UNIT/ML injection Inject  5-10 Units into the skin 3 (three) times daily before meals.       . insulin glargine (LANTUS) 100 UNIT/ML injection Inject 30 Units into the skin daily.      Marland Kitchen morphine (MS CONTIN) 60 MG 12 hr tablet Take 60 mg by mouth 2 (two) times daily.      Marland Kitchen oxyCODONE (OXY IR/ROXICODONE) 5 MG immediate release tablet Take 10 mg by mouth every 4 (four) hours as needed for pain.      . pantoprazole (PROTONIX) 20 MG tablet Take 20 mg by mouth daily.      . ramipril (ALTACE) 10 MG capsule Take 1 capsule (10 mg total) by mouth daily.  90 capsule  3  . salsalate (DISALCID) 750 MG tablet Take 750 mg by mouth 2 (two) times daily.      . sertraline (ZOLOFT) 100 MG tablet Take 200 mg by mouth daily.       . [DISCONTINUED] Insulin Pen Needle 31G X 8 MM MISC by Does not apply route.          Results for orders placed during the hospital encounter of 04/09/13 (from the past 48 hour(s))  VITAMIN D 25 HYDROXY     Status: Abnormal   Collection Time    04/11/13  2:26 PM      Result Value Range   Vit D, 25-Hydroxy 13 (*) 30 - 89 ng/mL   Comment: (NOTE)     This assay accurately quantifies Vitamin D, which is the sum of the     25-Hydroxy forms of Vitamin D2 and D3.  Studies have shown that the     optimum concentration of 25-Hydroxy Vitamin D is 30 ng/mL or higher.      Concentrations of Vitamin D between 20 and 29 ng/mL are considered to     be insufficient and concentrations less than 20 ng/mL are considered     to be deficient for Vitamin D.     Performed at [facility]  GLUCOSE, CAPILLARY     Status: Abnormal   Collection Time    04/11/13  4:55 PM      Result Value Range   Glucose-Capillary 132 (*) 70 - 99 mg/dL  GLUCOSE, CAPILLARY     Status: Abnormal   Collection Time    04/11/13  9:23 PM      Result Value Range   Glucose-Capillary 130 (*) 70 - 99 mg/dL   Comment 1 Notify RN    GLUCOSE, CAPILLARY     Status: Abnormal   Collection Time    04/12/13  3:28 AM      Result Value Range    Glucose-Capillary 256 (*) 70 - 99 mg/dL   Comment 1 Notify RN    CBC     Status: Abnormal   Collection Time    04/12/13  4:30 AM      Result Value Range   WBC 10.0  4.0 - 10.5 K/uL   RBC 4.44  4.22 - 5.81 MIL/uL   Hemoglobin 15.3  13.0 - 17.0 g/dL   HCT 16.1  09.6 - 04.5 %   MCV 99.5  78.0 - 100.0 fL   MCH 34.5 (*) 26.0 - 34.0 pg   MCHC 34.6  30.0 - 36.0 g/dL   RDW 40.9  81.1 - 91.4 %   Platelets 137 (*) 150 - 400 K/uL  GLUCOSE, CAPILLARY     Status: Abnormal   Collection Time    04/12/13  6:55 AM      Result Value Range   Glucose-Capillary 230 (*) 70 - 99 mg/dL  GLUCOSE, CAPILLARY     Status: Abnormal   Collection Time    04/12/13 10:58 AM      Result Value Range   Glucose-Capillary 170 (*) 70 - 99 mg/dL   Comment 1 Notify RN    GLUCOSE, CAPILLARY     Status: Abnormal   Collection Time    04/12/13  4:27 PM      Result Value Range   Glucose-Capillary 212 (*) 70 - 99 mg/dL   Comment 1 Notify RN    GLUCOSE, CAPILLARY     Status: None   Collection Time    04/12/13  9:18 PM      Result Value Range   Glucose-Capillary 93  70 - 99 mg/dL  CBC     Status: Abnormal   Collection Time    04/13/13  5:30 AM      Result Value Range   WBC 9.4  4.0 - 10.5 K/uL   RBC 4.19 (*) 4.22 - 5.81 MIL/uL   Hemoglobin 14.2  13.0 - 17.0 g/dL   HCT 78.2  95.6 - 21.3 %   MCV 101.2 (*) 78.0 - 100.0 fL   MCH 33.9  26.0 -  34.0 pg   MCHC 33.5  30.0 - 36.0 g/dL   RDW 40.9  81.1 - 91.4 %   Platelets 137 (*) 150 - 400 K/uL  GLUCOSE, CAPILLARY     Status: Abnormal   Collection Time    04/13/13  8:43 AM      Result Value Range   Glucose-Capillary 297 (*) 70 - 99 mg/dL  GLUCOSE, CAPILLARY     Status: Abnormal   Collection Time    04/13/13 11:30 AM      Result Value Range   Glucose-Capillary 330 (*) 70 - 99 mg/dL   Mr Lumbar Spine W Wo Contrast  04/13/2013   *RADIOLOGY REPORT*  Clinical Data: Acute on chronic back pain radiating to the right leg.  History of multiple wall back operations.  MRI  LUMBAR SPINE WITHOUT AND WITH CONTRAST  Technique:  Multiplanar and multiecho pulse sequences of the lumbar spine were obtained without and with intravenous contrast.  Contrast: 15mL MULTIHANCE GADOBENATE DIMEGLUMINE 529 MG/ML IV SOLN  Comparison: 04/09/2013 CT.  Findings: Lumbosacral transitional anatomy is present.   The L1 level demonstrated ribs on prior CT.  For reference, the superior endplate T12 Schmorl's node is used.  Spinal cord terminates posterior the L2 vertebra.  Mild levoconvex curvature.  Paraspinal soft tissues appear within normal limits. Scattered benign vertebral body hemangiomata.  T11-T12:  Sagittal imaging.  Schmorl's node.  No stenosis.  T12-L1:  Negative.  L1-L2:  Negative.  L2-L3:  Negative.  L3-L4:  Negative.  L4-L5:  Mild facet hypertrophy and ligamentum flavum redundancy. Shallow disc bulging.  Foramina appear patent.  L5-S1:  Postoperative changes of apparent bilateral laminotomies. Decompression of the central canal and lateral recesses.  Mild perineural fibrosis around the descending S1 nerves.  Broad-based posterior disc extrusion is present. Severe right and moderate left foraminal stenosis is multifactorial.  There is complete effacement of fat around the exiting right L5 nerve.  Foraminal stenosis is multifactorial, foraminal stenosis is secondary to broad-based disc extrusion, endplate osteophytes, short pedicles and facet hypertrophy.  Rudimentary S1-S2 disc.  No stenosis.  After contrast administration, expected postsurgical enhancement.  IMPRESSION: 1.  Postoperative changes of bilateral L5 laminotomies with decompression of the central canal and lateral recesses.  Mild perineural fibrosis around the descending S1 nerves. Bilateral right greater than left foraminal stenosis associated with broad- based disc extrusion, endplate osteophytes and facet arthrosis. 2.  Mild L4-L5 disc bulging without significant stenosis.   Original Report Authenticated By: Andreas Newport, M.D.     Review of Systems  Constitutional: Negative for fever.  Respiratory: Negative for shortness of breath.   Cardiovascular: Negative for chest pain.  Gastrointestinal: Negative for nausea, vomiting and abdominal pain.  Musculoskeletal: Positive for back pain and joint pain.       Low back pain to Rt hip to Rt knee to Rt foot- burning  Neurological: Positive for weakness. Negative for headaches.    Blood pressure 153/98, pulse 80, temperature 98 F (36.7 C), temperature source Oral, resp. rate 18, SpO2 91.00%. Physical Exam  Constitutional: He is oriented to person, place, and time. He appears well-nourished.  Cardiovascular: Normal rate, regular rhythm and normal heart sounds.   Respiratory: Effort normal and breath sounds normal. He has no wheezes.  GI: Soft. Bowel sounds are normal. There is no tenderness.  Musculoskeletal: Normal range of motion.  Pt has had recent Rt acetabulum fx- unable to ambulate Difficult to lay on abdomen; can lay on Left side well  Neurological: He  is alert and oriented to person, place, and time. Coordination abnormal.  Skin: Skin is warm and dry.  Psychiatric: He has a normal mood and affect. His behavior is normal. Judgment and thought content normal.     Assessment/Plan Acute on chronic back pain- worsening x few months Pain in low back to Rt hip;knee;foot- burning pain Has had epi steroid injection in 1993- good relief Hx laminectomy in 1993 and 2008 Request for epidural steroid inj while in hospital Pt aware of procedure benefits and risks and agreeable to proceed Consent signed and in chart  Pt was admitted 8/16 after fall at home: + Rt acetabular fx (non operative) Plan for SNF/rehab upon dc  Cristofer Yaffe A 04/13/2013, 11:55 AM

## 2013-04-13 NOTE — Progress Notes (Signed)
PT Cancellation Note  Patient Details Name: [name] MRN: [id number] DOB: 1961/02/22   Cancelled Treatment:    Reason Eval/Treat Not Completed: Medical issues which prohibited therapy;Pain limiting ability to participate -- per notes, plan steroid injection on 8/21 in IR, will reassess mobility and update treatment goals once completed with hopes of improved pain control enabling improved tolerance with guided mobility.     Narda Amber [facility] 04/13/2013, 12:42 PM

## 2013-04-13 NOTE — Progress Notes (Signed)
Patient ID: [name]  male  ZOX:[id number]    DOB: 10-Mar-1961    DOA: 04/09/2013  PCP: [name], MD  Assessment/Plan: Principal Problem:   Right acetabulum fracture, acute on chronic low back pain -  nonoperative fracture per orthopedics -  pain control and physical therapy as per orthopedics recommendations  - Physical therapy evaluation recommends skilled nursing facility/rehab - MRI of the L-spine done today showed bilateral right greater than left foraminal stenosis associated with broad-based disc extrusion, endplate osteophytes and facet arthrosis, mild L4-L5 disc bulging without significant stenosis. - Discussed with Dr. Charlann Boxer [facility] orthopedics, patient had prior laminectomy surgery done by Dr. Leslee Home, 2nd in [place]) who also reviewed the scans. Dr. Charlann Boxer recommended either followup in the office in 2 weeks or if patient has significant pain hindering the physical therapy, he can have epidural steroid injection for relief. There is no need of acute surgical intervention at this moment. - IR consult requested for epidural steroid injection.  Vitamin D deficiency - Continue vitamin D supplementation  Chronic pain syndrome now worsened due to right acetabulum fracture  - Continue home dose of MS Contin 60 mg BID, oxycodone IR 15 q4hrs PRN, Neurontin, baclofen, IV dilaudid PRN - Bowel regimen and PT    HTN; controlled - Continue home medication    DM type 2- poorly controlled inpatient, hemoglobin A1c is 7.6 outpatient, Poorly controlled -  Inc meal coverage 10 units tid, increase the Lantus to 37 units, continue moderate sliding scale.   Hyperlipidemia: LDL 56, cholesterol 139  DVT Prophylaxis: lovenox  Code Status:  Disposition: Plan discussed with patient in detail. He also wants the transfer to [place] acute settings. [facility]   Subjective: Continue to complain of lower back pain and right hip pain although  he feels that he is somewhat more mobile now.   Objective: Weight change:   Intake/Output Summary (Last 24 hours) at 04/13/13 1326 Last data filed at 04/13/13 0826  Gross per 24 hour  Intake    240 ml  Output    750 ml  Net   -510 ml   Blood pressure 153/98, pulse 80, temperature 98 F (36.7 C), temperature source Oral, resp. rate 18, SpO2 91.00%.  Physical Exam: General: A x O x3, NAD CVS: S1-S2 clear Chest: CTAB Abdomen: soft NT, ND, NBS  Extremities: no c/c/e   Lab Results: Basic Metabolic Panel:  Recent Labs Lab 04/09/13 0932  NA 138  K 4.3  CL 102  CO2 26  GLUCOSE 68*  BUN 9  CREATININE 0.58  CALCIUM 8.9   Liver Function Tests:  Recent Labs Lab 04/09/13 0932  AST 85*  ALT 48  ALKPHOS 265*  BILITOT 0.7  PROT 6.2  ALBUMIN 3.3*   No results found for this basename: LIPASE, AMYLASE,  in the last 168 hours No results found for this basename: AMMONIA,  in the last 168 hours CBC:  Recent Labs Lab 04/11/13 0628 04/12/13 0430 04/13/13 0530  WBC 8.5 10.0 9.4  NEUTROABS 6.0  --   --   HGB 14.9 15.3 14.2  HCT 42.5 44.2 42.4  MCV 98.4 99.5 101.2*  PLT 118* 137* 137*   Cardiac Enzymes: No results found for this basename: CKTOTAL, CKMB, CKMBINDEX, TROPONINI,  in the last 168 hours BNP: No components found with this basename: POCBNP,  CBG:  Recent Labs Lab 04/12/13 1058 04/12/13 1627 04/12/13 2118 04/13/13 0843 04/13/13 1130  GLUCAP 170*  212* 93 297* 330*     Micro Results: No results found for this or any previous visit (from the past 240 hour(s)).  Studies/Results: Ct Lumbar Spine Wo Contrast  04/09/2013   *RADIOLOGY REPORT*  Clinical data:  The fall with back and right hip pain.  Negative radiographs.  CT LUMBAR SPINE WITHOUT CONTRAST  Technique:  Multidetector CT imaging of the lumbar spine was performed without intravenous contrast administration.  Multiplanar CT image reconstructions were also generated.  Comparison:  Radiography  same day.  CT abdomen 08/06/2007.  Findings:  Alignment is normal.  There are old Schmorl's node defects at the superior endplate of T12.  There are newly seen but chronic appearing Schmorl's node defects at the inferior end plate of L5, more on the right than the left.  These are chronic based on surrounding sclerotic change.  No acute fracture of the spine is evident.  There are some degenerative changes of the sacroiliac joints, right more than left.  There is bulging of the disc at L4- 5.  There is shallow broad-based protrusion of the disc at L5-S1. There is mild lower lumbar facet arthropathy.  There is atherosclerosis of the aorta and its branch vessels but no evidence of aneurysm.  IMPRESSION: No acute finding in the lumbar region.  Chronic end plate Schmorl's nodes at T12 and L5.  Sacroiliac degeneration right more than left.  Disc bulge at L4-5.  Shallow disc protrusion at L5-S1.  CT PELVIS WITHOUT CONTRAST  Technique:  Multidetector CT imaging of the pelvis was performed following the standard protocol without intravenous contrast.   Findings:  There is a nondisplaced acetabular fracture on the right involving the acetabular roof and the anterior column.  No fracture of the femur is identified.  No other pelvic fracture.  No soft tissue hematoma.  IMPRESSION: Nondisplaced fractures of the acetabulum on the right affecting the acetabular roof and the anterior column.   Original Report Authenticated By: Paulina Fusi, M.D.   Ct Pelvis Wo Contrast  04/09/2013   *RADIOLOGY REPORT*  Clinical data:  The fall with back and right hip pain.  Negative radiographs.  CT LUMBAR SPINE WITHOUT CONTRAST  Technique:  Multidetector CT imaging of the lumbar spine was performed without intravenous contrast administration.  Multiplanar CT image reconstructions were also generated.  Comparison:  Radiography same day.  CT abdomen 08/06/2007.  Findings:  Alignment is normal.  There are old Schmorl's node defects at the superior  endplate of T12.  There are newly seen but chronic appearing Schmorl's node defects at the inferior end plate of L5, more on the right than the left.  These are chronic based on surrounding sclerotic change.  No acute fracture of the spine is evident.  There are some degenerative changes of the sacroiliac joints, right more than left.  There is bulging of the disc at L4- 5.  There is shallow broad-based protrusion of the disc at L5-S1. There is mild lower lumbar facet arthropathy.  There is atherosclerosis of the aorta and its branch vessels but no evidence of aneurysm.  IMPRESSION: No acute finding in the lumbar region.  Chronic end plate Schmorl's nodes at T12 and L5.  Sacroiliac degeneration right more than left.  Disc bulge at L4-5.  Shallow disc protrusion at L5-S1.  CT PELVIS WITHOUT CONTRAST  Technique:  Multidetector CT imaging of the pelvis was performed following the standard protocol without intravenous contrast.   Findings:  There is a nondisplaced acetabular fracture on the right  involving the acetabular roof and the anterior column.  No fracture of the femur is identified.  No other pelvic fracture.  No soft tissue hematoma.  IMPRESSION: Nondisplaced fractures of the acetabulum on the right affecting the acetabular roof and the anterior column.   Original Report Authenticated By: Paulina Fusi, M.D.   Dg Hip Portable 1 View Right  04/09/2013   *RADIOLOGY REPORT*  Clinical Data: Hip pain, fall  PORTABLE RIGHT HIP - 1 VIEW  Comparison: None.  Findings: Internal and external rotation views demonstrate no fracture or dislocation of the right femur.  There is arthritis of the hip joint with narrowing, osteophyte formation, and subchondral cystic change.There is a subtle focus of cortical irregularity involving the right inferior pubic ramus.  IMPRESSION: No hip fracture.  Possible subtle nondisplaced fracture inferior pubic ramus.  Consider CT if clinically appropriate.   Original Report Authenticated By:  Esperanza Heir, M.D.    Medications: Scheduled Meds: . baclofen  10 mg Oral TID  . docusate sodium  100 mg Oral BID  . [START ON 04/14/2013] enoxaparin (LOVENOX) injection  40 mg Subcutaneous Q24H  . ergocalciferol  50,000 Units Oral Weekly  . gabapentin  1,200 mg Oral TID  . insulin aspart  0-15 Units Subcutaneous TID WC  . insulin aspart  0-5 Units Subcutaneous QHS  . insulin aspart  7 Units Subcutaneous TID WC  . insulin glargine  35 Units Subcutaneous Daily  . morphine  60 mg Oral BID  . pantoprazole  20 mg Oral Daily  . ramipril  10 mg Oral Daily  . salsalate  750 mg Oral BID  . senna  1 tablet Oral BID  . sertraline  200 mg Oral Daily  . cyanocobalamin  100 mcg Oral Daily      LOS: 4 days   RAI,RIPUDEEP M.D. [facility] 04/13/2013, 1:26 PM Pager: [phone]  If 7PM-7AM, please contact night-coverage [email or web address] Password [email or web address]

## 2013-04-13 NOTE — Progress Notes (Signed)
Inpatient Diabetes Program Recommendations  AACE/ADA: New Consensus Statement on Inpatient Glycemic Control (2013)  Target Ranges:  Prepandial:   less than 140 mg/dL      Peak postprandial:   less than 180 mg/dL (1-2 hours)      Critically ill patients:  140 - 180 mg/dL   Reason for Visit: Results for AKI, ABALOS (MRN [id number]) as of 04/13/2013 15:26  Ref. Range 04/13/2013 08:43 04/13/2013 11:30  Glucose-Capillary Latest Range: 70-99 mg/dL 324 (H) 401 (H)   CBG's greater than goal in the hospital.  Patient states that he has been on insulin for many years after having pancreatitis.  A1C =7.6% indicating that average CBG's approximately 174 mg/dL prior to admit.  He is concerned that CBG's are so elevated.  Explained to patient the affects of stress and pain on blood glucoses.  Explained the difference between meal coverage and correction insulin.  He states that he is concerned about elevated CBG's.  Note insulins increased today.  Will follow.

## 2013-04-13 NOTE — Progress Notes (Signed)
Pt. Upset with staff for not scheduling his PRN narcotics.  He stated "You are all messing up my schedule".  I informed him that I could not provide narcotics to someone I had to wake up and nor could I automatically give prn narcotic medication as being scheduled when not ordered to be so.  Addressed pain management with patient and attempted to come to an understanding that when his pain reached a certain level, he should call for medication.  He was irate and wanted all medication to be given anyway, whether scheduled or not.

## 2013-04-14 ENCOUNTER — Inpatient Hospital Stay (HOSPITAL_COMMUNITY): Payer: BC Managed Care – PPO

## 2013-04-14 DIAGNOSIS — K7030 Alcoholic cirrhosis of liver without ascites: Secondary | ICD-10-CM

## 2013-04-14 LAB — GLUCOSE, CAPILLARY
Glucose-Capillary: 332 mg/dL — ABNORMAL HIGH (ref 70–99)
Glucose-Capillary: 125 mg/dL — ABNORMAL HIGH (ref 70–99)

## 2013-04-14 LAB — CBC
HCT: 40.2 % (ref 39.0–52.0)
Hemoglobin: 13.6 g/dL (ref 13.0–17.0)
MCHC: 33.8 g/dL (ref 30.0–36.0)
RBC: 3.99 MIL/uL — ABNORMAL LOW (ref 4.22–5.81)

## 2013-04-14 MED ORDER — METHYLPREDNISOLONE ACETATE 40 MG/ML IJ SUSP
INTRAMUSCULAR | Status: AC
  Filled 2013-04-14: qty 5

## 2013-04-14 MED ORDER — INSULIN GLARGINE 100 UNIT/ML ~~LOC~~ SOLN
40.0000 [IU] | Freq: Every day | SUBCUTANEOUS | Status: DC
  Administered 2013-04-15: 40 [IU] via SUBCUTANEOUS
  Filled 2013-04-14: qty 0.4

## 2013-04-14 MED ORDER — INSULIN GLARGINE 100 UNIT/ML ~~LOC~~ SOLN: 42.0000 [IU] | Freq: Every day | SUBCUTANEOUS | Status: DC

## 2013-04-14 MED ORDER — METHYLPREDNISOLONE ACETATE 40 MG/ML INJ SUSP (RADIOLOG
120.0000 mg | Freq: Once | INTRAMUSCULAR | Status: AC
  Administered 2013-04-14: 40 mg via EPIDURAL

## 2013-04-14 MED ORDER — METHYLPREDNISOLONE ACETATE 80 MG/ML IJ SUSP
INTRAMUSCULAR | Status: AC
  Filled 2013-04-14: qty 1

## 2013-04-14 NOTE — Procedures (Signed)
120 mg depot-medrol via a right L5/S1 tranforaminal approach.  No complications.

## 2013-04-14 NOTE — Progress Notes (Signed)
OT Cancellation Note  Patient Details Name: [name] MRN: [id number] DOB: 06-May-1961   Cancelled Treatment:    Reason Eval/Treat Not Completed: Medical issues which prohibited therapy  Pt just back from receiving injections, will check back again tomorrow to proceed with evaluation.    Mattson Dayal 04/14/2013, 4:37 PM

## 2013-04-14 NOTE — Progress Notes (Signed)
PT Cancellation Note  Patient Details Name: DUY LEMMING MRN: [id number] DOB: 08/05/1961   Cancelled Treatment:    Reason Eval/Treat Not Completed: Pain limiting ability to participate. Pt scheduled for injection this afternoon. Pt refuses to work with therapy until pain is controlled. Will attempt to see when pain is more controlled.    Donnamarie Poag [place], [place] [phone] 04/14/2013, 12:01 PM

## 2013-04-14 NOTE — Progress Notes (Addendum)
Patient ID: [name]  male  ZOX:[id number]    DOB: May 20, 1961    DOA: 04/09/2013  PCP: [name], MD  Assessment/Plan: Principal Problem:   Right acetabulum fracture, acute on chronic low back pain -  nonoperative fracture per orthopedics -  pain control and physical therapy as per orthopedics recommendations  - Physical therapy evaluation recommends skilled nursing facility/rehab - MRI of the L-spine done today showed bilateral right greater than left foraminal stenosis associated with broad-based disc extrusion, endplate osteophytes and facet arthrosis, mild L4-L5 disc bulging without significant stenosis. - Discussed with Dr. Charlann Boxer [facility] orthopedics, patient had prior laminectomy surgery done by Dr. Leslee Home, 2nd in [place]) who also reviewed the scans. Dr. Charlann Boxer recommended either followup in the office in 2 weeks or if patient has significant pain hindering the physical therapy, he can have epidural steroid injection for relief. There is no need of acute surgical intervention at this moment. -  epidural steroid injection today. Patient has not participated with physical therapy, he wants to do physical therapy after the epidural steroid injection.  Vitamin D deficiency - Continue vitamin D supplementation  Chronic pain syndrome now worsened due to right acetabulum fracture  - Continue home dose of MS Contin 60 mg BID, oxycodone IR 15 q4hrs PRN, Neurontin, baclofen, IV dilaudid PRN - Bowel regimen and PT    HTN; controlled - Continue home medication    DM type 2- poorly controlled inpatient, hemoglobin A1c is 7.6 outpatient, better controlled now - cont meal coverage 10 units tid, increase the Lantus to 40 units, continue moderate sliding scale.   Hyperlipidemia: LDL 56, cholesterol 139  DVT Prophylaxis: lovenox  Code Status:  Disposition: Plan discussed with patient in detail. Discussed with Dr. Roanna Banning from the [facility] hospital [facility] yesterday evening. Per Dr.  Roanna Banning, the patient can be accepted at the [facility] acute care if he needs more than 48 hours of inpatient care after the epidural injections. Will have physical therapy evaluate patient after the epidural shots.  Subjective: Patient appears to be doing better however states that he still has significant pain and awaiting epidural at the time of my encounter   Objective: Weight change:   Intake/Output Summary (Last 24 hours) at 04/14/13 1350 Last data filed at 04/14/13 0235  Gross per 24 hour  Intake    480 ml  Output   1300 ml  Net   -820 ml   Blood pressure 143/70, pulse 69, temperature 97.9 F (36.6 C), temperature source Oral, resp. rate 18, SpO2 94.00%.  Physical Exam: General: A x O x3, NAD CVS: S1-S2 clear Chest: CTAB Abdomen: soft NT, ND, NBS  Extremities: no c/c/e   Lab Results: Basic Metabolic Panel:  Recent Labs Lab 04/09/13 0932  NA 138  K 4.3  CL 102  CO2 26  GLUCOSE 68*  BUN 9  CREATININE 0.58  CALCIUM 8.9   Liver Function Tests:  Recent Labs Lab 04/09/13 0932  AST 85*  ALT 48  ALKPHOS 265*  BILITOT 0.7  PROT 6.2  ALBUMIN 3.3*   No results found for this basename: LIPASE, AMYLASE,  in the last 168 hours No results found for this basename: AMMONIA,  in the last 168 hours CBC:  Recent Labs Lab 04/11/13 0628  04/13/13 0530 04/14/13 0500  WBC 8.5  < > 9.4 8.6  NEUTROABS 6.0  --   --   --   HGB 14.9  < > 14.2 13.6  HCT 42.5  < >  42.4 40.2  MCV 98.4  < > 101.2* 100.8*  PLT 118*  < > 137* 133*  < > = values in this interval not displayed. Cardiac Enzymes: No results found for this basename: CKTOTAL, CKMB, CKMBINDEX, TROPONINI,  in the last 168 hours BNP: No components found with this basename: POCBNP,  CBG:  Recent Labs Lab 04/13/13 1130 04/13/13 1621 04/13/13 2115 04/14/13 0636 04/14/13 1053  GLUCAP 330* 111* 122* 332* 125*     Micro Results: No results found for this or any previous visit (from the past 240  hour(s)).  Studies/Results: Ct Lumbar Spine Wo Contrast  04/09/2013   *RADIOLOGY REPORT*  Clinical data:  The fall with back and right hip pain.  Negative radiographs.  CT LUMBAR SPINE WITHOUT CONTRAST  Technique:  Multidetector CT imaging of the lumbar spine was performed without intravenous contrast administration.  Multiplanar CT image reconstructions were also generated.  Comparison:  Radiography same day.  CT abdomen 08/06/2007.  Findings:  Alignment is normal.  There are old Schmorl's node defects at the superior endplate of T12.  There are newly seen but chronic appearing Schmorl's node defects at the inferior end plate of L5, more on the right than the left.  These are chronic based on surrounding sclerotic change.  No acute fracture of the spine is evident.  There are some degenerative changes of the sacroiliac joints, right more than left.  There is bulging of the disc at L4- 5.  There is shallow broad-based protrusion of the disc at L5-S1. There is mild lower lumbar facet arthropathy.  There is atherosclerosis of the aorta and its branch vessels but no evidence of aneurysm.  IMPRESSION: No acute finding in the lumbar region.  Chronic end plate Schmorl's nodes at T12 and L5.  Sacroiliac degeneration right more than left.  Disc bulge at L4-5.  Shallow disc protrusion at L5-S1.  CT PELVIS WITHOUT CONTRAST  Technique:  Multidetector CT imaging of the pelvis was performed following the standard protocol without intravenous contrast.   Findings:  There is a nondisplaced acetabular fracture on the right involving the acetabular roof and the anterior column.  No fracture of the femur is identified.  No other pelvic fracture.  No soft tissue hematoma.  IMPRESSION: Nondisplaced fractures of the acetabulum on the right affecting the acetabular roof and the anterior column.   Original Report Authenticated By: Paulina Fusi, M.D.   Ct Pelvis Wo Contrast  04/09/2013   *RADIOLOGY REPORT*  Clinical data:  The fall  with back and right hip pain.  Negative radiographs.  CT LUMBAR SPINE WITHOUT CONTRAST  Technique:  Multidetector CT imaging of the lumbar spine was performed without intravenous contrast administration.  Multiplanar CT image reconstructions were also generated.  Comparison:  Radiography same day.  CT abdomen 08/06/2007.  Findings:  Alignment is normal.  There are old Schmorl's node defects at the superior endplate of T12.  There are newly seen but chronic appearing Schmorl's node defects at the inferior end plate of L5, more on the right than the left.  These are chronic based on surrounding sclerotic change.  No acute fracture of the spine is evident.  There are some degenerative changes of the sacroiliac joints, right more than left.  There is bulging of the disc at L4- 5.  There is shallow broad-based protrusion of the disc at L5-S1. There is mild lower lumbar facet arthropathy.  There is atherosclerosis of the aorta and its branch vessels but no evidence of aneurysm.  IMPRESSION: No acute finding in the lumbar region.  Chronic end plate Schmorl's nodes at T12 and L5.  Sacroiliac degeneration right more than left.  Disc bulge at L4-5.  Shallow disc protrusion at L5-S1.  CT PELVIS WITHOUT CONTRAST  Technique:  Multidetector CT imaging of the pelvis was performed following the standard protocol without intravenous contrast.   Findings:  There is a nondisplaced acetabular fracture on the right involving the acetabular roof and the anterior column.  No fracture of the femur is identified.  No other pelvic fracture.  No soft tissue hematoma.  IMPRESSION: Nondisplaced fractures of the acetabulum on the right affecting the acetabular roof and the anterior column.   Original Report Authenticated By: Paulina Fusi, M.D.   Dg Hip Portable 1 View Right  04/09/2013   *RADIOLOGY REPORT*  Clinical Data: Hip pain, fall  PORTABLE RIGHT HIP - 1 VIEW  Comparison: None.  Findings: Internal and external rotation views demonstrate  no fracture or dislocation of the right femur.  There is arthritis of the hip joint with narrowing, osteophyte formation, and subchondral cystic change.There is a subtle focus of cortical irregularity involving the right inferior pubic ramus.  IMPRESSION: No hip fracture.  Possible subtle nondisplaced fracture inferior pubic ramus.  Consider CT if clinically appropriate.   Original Report Authenticated By: Esperanza Heir, M.D.    Medications: Scheduled Meds: . baclofen  10 mg Oral TID  . docusate sodium  100 mg Oral BID  . enoxaparin (LOVENOX) injection  40 mg Subcutaneous Q24H  . ergocalciferol  50,000 Units Oral Weekly  . gabapentin  1,200 mg Oral TID  . insulin aspart  0-15 Units Subcutaneous TID WC  . insulin aspart  0-5 Units Subcutaneous QHS  . insulin aspart  10 Units Subcutaneous TID WC  . insulin glargine  37 Units Subcutaneous Daily  . morphine  60 mg Oral BID  . pantoprazole  20 mg Oral Daily  . ramipril  10 mg Oral Daily  . salsalate  750 mg Oral BID  . senna  1 tablet Oral BID  . sertraline  200 mg Oral Daily  . cyanocobalamin  100 mcg Oral Daily      LOS: 5 days   Nayson Traweek M.D. [facility] 04/14/2013, 1:50 PM Pager: [phone]  If 7PM-7AM, please contact night-coverage [email or web address] Password [email or web address]

## 2013-04-15 DIAGNOSIS — R160 Hepatomegaly, not elsewhere classified: Secondary | ICD-10-CM

## 2013-04-15 LAB — CBC
HCT: 41.3 % (ref 39.0–52.0)
MCH: 34 pg (ref 26.0–34.0)
MCHC: 33.9 g/dL (ref 30.0–36.0)
MCV: 100.2 fL — ABNORMAL HIGH (ref 78.0–100.0)
RDW: 13.4 % (ref 11.5–15.5)
WBC: 10 10*3/uL (ref 4.0–10.5)

## 2013-04-15 LAB — MRSA PCR SCREENING: MRSA by PCR: NEGATIVE

## 2013-04-15 LAB — OPIATE, QUANTITATIVE, URINE
6 Monoacetylmorphine, Ur-Confirm: NEGATIVE ng/mL
Hydrocodone: NEGATIVE ng/mL
Morphine, Confirm: >10000 ng/mL — ABNORMAL HIGH

## 2013-04-15 LAB — GLUCOSE, CAPILLARY
Glucose-Capillary: 45 mg/dL — ABNORMAL LOW (ref 70–99)
Glucose-Capillary: 63 mg/dL — ABNORMAL LOW (ref 70–99)
Glucose-Capillary: 208 mg/dL — ABNORMAL HIGH (ref 70–99)

## 2013-04-15 MED ORDER — INSULIN ASPART 100 UNIT/ML ~~LOC~~ SOLN
5.0000 [IU] | Freq: Three times a day (TID) | SUBCUTANEOUS | Status: DC
  Administered 2013-04-15: 5 [IU] via SUBCUTANEOUS
  Administered 2013-04-15: 8 [IU] via SUBCUTANEOUS
  Administered 2013-04-16: 5 [IU] via SUBCUTANEOUS

## 2013-04-15 MED ORDER — LIVING WELL WITH DIABETES BOOK
Freq: Once | Status: DC
  Filled 2013-04-15: qty 1

## 2013-04-15 MED ORDER — INSULIN GLARGINE 100 UNIT/ML ~~LOC~~ SOLN: 45.0000 [IU] | Freq: Every day | SUBCUTANEOUS | Status: DC

## 2013-04-15 MED ORDER — INSULIN GLARGINE 100 UNIT/ML ~~LOC~~ SOLN
40.0000 [IU] | Freq: Every day | SUBCUTANEOUS | Status: DC
  Administered 2013-04-16: 40 [IU] via SUBCUTANEOUS
  Filled 2013-04-15: qty 0.4

## 2013-04-15 NOTE — Progress Notes (Addendum)
Physical Therapy Treatment Patient Details Name: [name] MRN: [id number] DOB: 01-02-1961 Today's Date: 04/15/2013 Time: 4098-1191 PT Time Calculation (min): 17 min  PT Assessment / Plan / Recommendation  History of Present Illness Pt received injection in L5-S1 8/21 to help manage pain; pt adm due to fall from barstool resulting in Rt nondisplaced acetabular fx (non operative). PMH includes neuropathy, DM, HTN, alcoholism, cirrhosis and pancreatitis.    PT Comments   Pt is beginning to progress slowly with mobility. Pt was able to bring Lt LE off EOB and onto floor; pt was able this session to advance Rt LE with use of UEs, trapeze bar and with increased time to EOB; pt was unable to place Rt LE to floor due to "excruciating" and "burning" pain. Pt requires max encouragement to participate.  Cont to recommend STSNF for rehab upon acute D/C. Pt has limited support at home. His wife works and would be alone majority of day. Encouraged pt to continue AROM exercises on Rt LE to increase ROM and strength and prevent contractures or DVT, pt verbalized understanding.   Follow Up Recommendations  Supervision/Assistance - 24 hour;SNF;Other (comment)     Does the patient have the potential to tolerate intense rehabilitation     Barriers to Discharge        Equipment Recommendations  Rolling walker with 5" wheels;3in1 (PT);Wheelchair (measurements PT);Wheelchair cushion (measurements PT);Hospital bed    Recommendations for Other Services    Frequency Min 3X/week   Progress towards PT Goals Progress towards PT goals: slowly progressing; limited by pain and self-limiting   Plan Current plan remains appropriate    Precautions / Restrictions Precautions Precautions: Fall Restrictions Weight Bearing Restrictions: Yes RLE Weight Bearing: Touchdown weight bearing Other Position/Activity Restrictions: TDWB/NWB per MD note   Pertinent Vitals/Pain Did not rate pain numerically but describes it  as "excruciating, sharp shooting, like a knife and taser"     Mobility  Bed Mobility Bed Mobility: Scooting to HOB;Supine to Sit;Sit to Supine Supine to Sit: 5: Supervision (attempted to come to sit on EOB) Sitting - Scoot to Edge of Bed: 5: Supervision;Other (comment) (trapeze bar ) Sit to Supine: 5: Supervision;HOB elevated;With rail;Other (comment) (with trapeze ) Scooting to [facility]: 5: Supervision;With trapeze Details for Bed Mobility Assistance: pt able to advance LEs independently to EOB; was able to bring Lt LE off bed onto floor; used trapeze and bil UEs to advance Rt LE off EOB; pt was unable to bring Rt LE onto floor due to pain; pt required increased time to perform bed mobility; cues for deep breathing exercises to control pain; pt relies heavily on HOB being elevated and trapeze bars for bed mobility; pt able to scoot to [facility] with use of trapeze bar and WB through Lt LE Transfers Transfers: Not assessed Ambulation/Gait Ambulation/Gait Assistance: Not tested (comment) Stairs: No Wheelchair Mobility Wheelchair Mobility: No    Exercises General Exercises - Lower Extremity Ankle Circles/Pumps: 5 reps;AROM;Both;Supine Heel Slides: AAROM;5 reps;Right;Other (comment) (full ROM with use of UEs to perform )   PT Diagnosis:    PT Problem List:   PT Treatment Interventions:     PT Goals (current goals can now be found in the care plan section) Acute Rehab PT Goals Patient Stated Goal: to go to [place] rehab in [place] PT Goal Formulation: With patient Time For Goal Achievement: 04/17/13 Potential to Achieve Goals: Fair  Visit Information  Last PT Received On: 04/15/13 Assistance Needed: +2 PT/OT Co-Evaluation/Treatment: Yes History of Present  Illness: Pt received injection in L5-S1 8/21 to help manage pain; pt adm due to fall from barstool resulting in Rt nondisplaced acetabular fx (non operative). PMH includes neuropathy, DM, HTN, alcoholism, cirrhosis and pancreatitis.      Subjective Data  Subjective: pt more motivated today to make progress despite in "excruciating pain" Patient Stated Goal: to go to [place] rehab in [facility]  Cognition Arousal/Alertness: Awake/alert Behavior During Therapy: [facility] for tasks assessed/performed Overall Cognitive Status: Within Functional Limits for tasks assessed         End of Session PT - End of Session Activity Tolerance: Patient limited by pain Patient left: in bed;with call bell/phone within reach Nurse Communication: Mobility status   GP     Donell Sievert, [place] [phone] 04/15/2013, 10:48 AM

## 2013-04-15 NOTE — Progress Notes (Addendum)
Patient ID: [name]  male  ZOX:[id number]    DOB: 01-18-1961    DOA: 04/09/2013  PCP: [name], MD   Addendum: Called by RN, patient had hypoglycemia with BG 45, will dc meal coverage Cont SSI  Dec lantus back to 40units.    RAI,RIPUDEEP M.D. [facility] 04/15/2013, 12:15 PM  Pager: [phone]     Interim summary:  The patient is a 52 year old male with history of diabetes type 2, hypertension, osteoporosis, chronic back pain with DJD presented with fall and severe right hip pain. Patient was sitting on elevated bar stool when he slipped and fell off the chair onto his right hip. He had immediate severe pain and was unable to move the leg or ambulate. Hip x-rays showed nondisplaced fracture of the inferior pubic rami. CT pelvis showed Nondisplaced fracture of the acetabulum on the right affecting the acetabular roof and the anterior column. Orthopedics was consulted and recommended no surgical intervention, nonweightbearing on the right lower extremity, followup with orthopedics in 2 weeks.  Patient however continued to complain of severe pain, lower back pain as well hence MRI of the L-spine was done which showed bilateral right greater than left foraminal stenosis with broad-based disc extrusion, mild L4-L5 disc bulging without significant stenosis. This was discussed with Dr. Charlann Boxer who recommended epidural steroid injection and followup with orthopedics in 2 weeks. Interventional radiology was consulted and patient underwent epidural steroid injection on 04/14/2013. Patient was seen by physical therapy today after the epidural steroid injection and determined that patient needs skilled nursing facility for continued rehabilitation.   Assessment/Plan: Principal Problem:   Right acetabulum fracture, acute on chronic low back pain -  nonoperative fracture per orthopedics -  pain control and physical therapy as per orthopedics recommendations  - Physical therapy  evaluation recommends skilled nursing facility/rehab - MRI of the L-spine done->  showed bilateral right greater than left foraminal stenosis associated with broad-based disc extrusion, endplate osteophytes and facet arthrosis, mild L4-L5 disc bulging without significant stenosis. - Discussed with Dr. Charlann Boxer [facility] orthopedics, patient had prior laminectomy surgery done by Dr. Leslee Home, 2nd in [place]) who also reviewed the scans. Dr. Charlann Boxer recommended either followup in the office in 2 weeks or if patient has significant pain hindering the physical therapy, he can have epidural steroid injection for relief. There is no need of acute surgical intervention at this moment. -  epidural steroid injection done - PT eval ->SNF  Vitamin D deficiency - Continue vitamin D supplementation  Chronic pain syndrome now worsened due to right acetabulum fracture  - Continue home dose of MS Contin 60 mg BID, oxycodone IR 15 q4hrs PRN, Neurontin, baclofen, IV dilaudid PRN - Bowel regimen and PT   HTN; controlled - Continue home medication    DM type 2- poorly controlled inpatient, hemoglobin A1c is 7.6 outpatient - Blood sugars again up today ? Epidural steroid injection   - cont meal coverage 10 units tid, increase the Lantus to 45units, continue moderate sliding scale.   Hyperlipidemia: LDL 56, cholesterol 139  DVT Prophylaxis: lovenox  Code Status:  Disposition: Discussed with the case manager today patient had been wanting to go to [place] acute care but was denied this morning. Discussed with SW, will start skilled nursing facility search.  Subjective: Feels a whole lot better today after epidural injection, feels that he is going in the right direction now but not very motivated for the physical therapy.   Objective: Weight change:   Intake/Output  Summary (Last 24 hours) at 04/15/13 1133 Last data filed at 04/15/13 0838  Gross per 24 hour  Intake   1920 ml  Output   2100 ml  Net   -180  ml   Blood pressure 124/80, pulse 73, temperature 97.1 F (36.2 C), temperature source Oral, resp. rate 16, SpO2 98.00%.  Physical Exam: General: A x O x3, NAD CVS: S1-S2 clear Chest: CTAB Abdomen: soft NT, ND, NBS  Extremities: no c/c/e   Lab Results: Basic Metabolic Panel:  Recent Labs Lab 04/09/13 0932  NA 138  K 4.3  CL 102  CO2 26  GLUCOSE 68*  BUN 9  CREATININE 0.58  CALCIUM 8.9   Liver Function Tests:  Recent Labs Lab 04/09/13 0932  AST 85*  ALT 48  ALKPHOS 265*  BILITOT 0.7  PROT 6.2  ALBUMIN 3.3*   No results found for this basename: LIPASE, AMYLASE,  in the last 168 hours No results found for this basename: AMMONIA,  in the last 168 hours CBC:  Recent Labs Lab 04/11/13 0628  04/14/13 0500 04/15/13 0525  WBC 8.5  < > 8.6 10.0  NEUTROABS 6.0  --   --   --   HGB 14.9  < > 13.6 14.0  HCT 42.5  < > 40.2 41.3  MCV 98.4  < > 100.8* 100.2*  PLT 118*  < > 133* 164  < > = values in this interval not displayed. Cardiac Enzymes: No results found for this basename: CKTOTAL, CKMB, CKMBINDEX, TROPONINI,  in the last 168 hours BNP: No components found with this basename: POCBNP,  CBG:  Recent Labs Lab 04/14/13 0636 04/14/13 1053 04/14/13 1627 04/14/13 2239 04/15/13 0712  GLUCAP 332* 125* 106* 268* 318*     Micro Results: No results found for this or any previous visit (from the past 240 hour(s)).  Studies/Results: Ct Lumbar Spine Wo Contrast  04/09/2013   *RADIOLOGY REPORT*  Clinical data:  The fall with back and right hip pain.  Negative radiographs.  CT LUMBAR SPINE WITHOUT CONTRAST  Technique:  Multidetector CT imaging of the lumbar spine was performed without intravenous contrast administration.  Multiplanar CT image reconstructions were also generated.  Comparison:  Radiography same day.  CT abdomen 08/06/2007.  Findings:  Alignment is normal.  There are old Schmorl's node defects at the superior endplate of T12.  There are newly seen but  chronic appearing Schmorl's node defects at the inferior end plate of L5, more on the right than the left.  These are chronic based on surrounding sclerotic change.  No acute fracture of the spine is evident.  There are some degenerative changes of the sacroiliac joints, right more than left.  There is bulging of the disc at L4- 5.  There is shallow broad-based protrusion of the disc at L5-S1. There is mild lower lumbar facet arthropathy.  There is atherosclerosis of the aorta and its branch vessels but no evidence of aneurysm.  IMPRESSION: No acute finding in the lumbar region.  Chronic end plate Schmorl's nodes at T12 and L5.  Sacroiliac degeneration right more than left.  Disc bulge at L4-5.  Shallow disc protrusion at L5-S1.  CT PELVIS WITHOUT CONTRAST  Technique:  Multidetector CT imaging of the pelvis was performed following the standard protocol without intravenous contrast.   Findings:  There is a nondisplaced acetabular fracture on the right involving the acetabular roof and the anterior column.  No fracture of the femur is identified.  No other pelvic  fracture.  No soft tissue hematoma.  IMPRESSION: Nondisplaced fractures of the acetabulum on the right affecting the acetabular roof and the anterior column.   Original Report Authenticated By: Paulina Fusi, M.D.   Ct Pelvis Wo Contrast  04/09/2013   *RADIOLOGY REPORT*  Clinical data:  The fall with back and right hip pain.  Negative radiographs.  CT LUMBAR SPINE WITHOUT CONTRAST  Technique:  Multidetector CT imaging of the lumbar spine was performed without intravenous contrast administration.  Multiplanar CT image reconstructions were also generated.  Comparison:  Radiography same day.  CT abdomen 08/06/2007.  Findings:  Alignment is normal.  There are old Schmorl's node defects at the superior endplate of T12.  There are newly seen but chronic appearing Schmorl's node defects at the inferior end plate of L5, more on the right than the left.  These are  chronic based on surrounding sclerotic change.  No acute fracture of the spine is evident.  There are some degenerative changes of the sacroiliac joints, right more than left.  There is bulging of the disc at L4- 5.  There is shallow broad-based protrusion of the disc at L5-S1. There is mild lower lumbar facet arthropathy.  There is atherosclerosis of the aorta and its branch vessels but no evidence of aneurysm.  IMPRESSION: No acute finding in the lumbar region.  Chronic end plate Schmorl's nodes at T12 and L5.  Sacroiliac degeneration right more than left.  Disc bulge at L4-5.  Shallow disc protrusion at L5-S1.  CT PELVIS WITHOUT CONTRAST  Technique:  Multidetector CT imaging of the pelvis was performed following the standard protocol without intravenous contrast.   Findings:  There is a nondisplaced acetabular fracture on the right involving the acetabular roof and the anterior column.  No fracture of the femur is identified.  No other pelvic fracture.  No soft tissue hematoma.  IMPRESSION: Nondisplaced fractures of the acetabulum on the right affecting the acetabular roof and the anterior column.   Original Report Authenticated By: Paulina Fusi, M.D.   Dg Hip Portable 1 View Right  04/09/2013   *RADIOLOGY REPORT*  Clinical Data: Hip pain, fall  PORTABLE RIGHT HIP - 1 VIEW  Comparison: None.  Findings: Internal and external rotation views demonstrate no fracture or dislocation of the right femur.  There is arthritis of the hip joint with narrowing, osteophyte formation, and subchondral cystic change.There is a subtle focus of cortical irregularity involving the right inferior pubic ramus.  IMPRESSION: No hip fracture.  Possible subtle nondisplaced fracture inferior pubic ramus.  Consider CT if clinically appropriate.   Original Report Authenticated By: Esperanza Heir, M.D.    Medications: Scheduled Meds: . baclofen  10 mg Oral TID  . docusate sodium  100 mg Oral BID  . enoxaparin (LOVENOX) injection  40  mg Subcutaneous Q24H  . ergocalciferol  50,000 Units Oral Weekly  . gabapentin  1,200 mg Oral TID  . insulin aspart  0-15 Units Subcutaneous TID WC  . insulin aspart  0-5 Units Subcutaneous QHS  . insulin aspart  10 Units Subcutaneous TID WC  . insulin glargine  40 Units Subcutaneous Daily  . morphine  60 mg Oral BID  . pantoprazole  20 mg Oral Daily  . ramipril  10 mg Oral Daily  . salsalate  750 mg Oral BID  . senna  1 tablet Oral BID  . sertraline  200 mg Oral Daily  . cyanocobalamin  100 mcg Oral Daily      LOS: 6  days   RAI,RIPUDEEP M.D. [facility] 04/15/2013, 11:33 AM Pager: [phone]  If 7PM-7AM, please contact night-coverage [email or web address] Password [email or web address]

## 2013-04-15 NOTE — Clinical Social Work Psychosocial (Signed)
Clinical Social Work Department  BRIEF PSYCHOSOCIAL ASSESSMENT  Patient: [name] Account Number: [id number]   Admit date: 04/09/13 Clinical Social Worker Sabino Niemann, MSW Date/Time: 04/15/2013 2:00 PM Referred by: Physician Date Referred:  Referred for   SNF Placement at [facility]  Other Referral:  Interview type: Patient and patient's wife Other interview type: PSYCHOSOCIAL DATA  Living Status: [place] Admitted from facility:  Level of care:  Primary support name: [name],[name]  Primary support relationship to patient: Wife Degree of support available:  Strong and vested  CURRENT CONCERNS  Current Concerns   Post-Acute Placement   Other Concerns:  SOCIAL WORK ASSESSMENT / PLAN  CSW met with pt and patient's wifere: PT recommendation for SNF.   Pt lives with his wife  CSW explained placement process and answered questions.   Pt reports the [facility] as his preference- all clinicals have been faxed to the [place]. A SNF search was started as a possible backup    CSW completed FL2 and sent clinicals to the [place] and also initiated SNF search.     Assessment/plan status: Information/Referral to [facility]  Other assessment/ plan:  Information/referral to community resources:  SNF     PATIENT'S/FAMILY'S RESPONSE TO PLAN OF CARE:  Pt  reports he is agreeable to [facility] SN[facility]( he has refused to work with PT/OT on several occasions due to his pain levels)  in order to increase strength and independence with mobility prior to returning home  Pt verbalized understanding of placement process and appreciation for CSW assist.   Sabino Niemann, MSW [phone]

## 2013-04-15 NOTE — Progress Notes (Signed)
All Clinicals have been faxed ( Fax #: [phone])to Eliane Decree at the [place]. Her number is [phone], [phone]. She is awaiting the results of the MRSA swab.   Sabino Niemann, MSW [phone]

## 2013-04-15 NOTE — Evaluation (Signed)
Occupational Therapy Evaluation Patient Details Name: [name] MRN: [id number] DOB: 08/28/1960 Today's Date: 04/15/2013 Time: 5784-6962 OT Time Calculation (min): 16 min  OT Assessment / Plan / Recommendation History of present illness Pt received injection in L5-S1 8/21 to help manage pain; pt adm due to fall from barstool resulting in Rt nondisplaced acetabular fx (non operative). PMH includes neuropathy, DM, HTN, alcoholism, cirrhosis and pancreatitis.    Clinical Impression   Pt admitted with above. Pt currently with functional limitations due to the deficits listed below (see OT Problem List). Pt will benefit from skilled OT to increase their safety and independence with ADL and functional mobility for ADL to facilitate discharge to venue listed below.       OT Assessment  Patient needs continued OT Services    Follow Up Recommendations  SNF    Barriers to Discharge Decreased caregiver support    Equipment Recommendations  None recommended by OT       Frequency  Min 2X/week    Precautions / Restrictions Precautions Precautions: Fall Restrictions Weight Bearing Restrictions: Yes RLE Weight Bearing: Touchdown weight bearing Other Position/Activity Restrictions: TDWB/NWB per MD note   Pertinent Vitals/Pain 10/10 in right leg/pelvis    ADL  Eating/Feeding: Independent Where Assessed - Eating/Feeding: Bed level Grooming: Set up Where Assessed - Grooming: Supine, head of bed up Upper Body Bathing: Set up Where Assessed - Upper Body Bathing: Supine, head of bed up Lower Body Bathing: Maximal assistance Where Assessed - Lower Body Bathing: Supine, head of bed up Upper Body Dressing: Maximal assistance Where Assessed - Upper Body Dressing: Supine, head of bed up Lower Body Dressing: +1 Total assistance Where Assessed - Lower Body Dressing: Supine, head of bed up Transfers/Ambulation Related to ADLs: Unable at this time due to pain    OT Diagnosis: Acute pain   OT Problem List: Decreased strength;Decreased range of motion;Pain;Decreased activity tolerance;Impaired balance (sitting and/or standing) OT Treatment Interventions: Balance training;Therapeutic activities;DME and/or AE instruction;Patient/family education;Self-care/ADL training   OT Goals(Current goals can be found in the care plan section) Acute Rehab OT Goals Patient Stated Goal: to go to [place] [facility] in [place] OT Goal Formulation: With patient Time For Goal Achievement: 04/29/13 Potential to Achieve Goals: Good  Visit Information  Last OT Received On: 04/15/13 Assistance Needed: +2 PT/OT Co-Evaluation/Treatment: Yes History of Present Illness: Pt received injection in L5-S1 8/21 to help manage pain; pt adm due to fall from barstool resulting in Rt nondisplaced acetabular fx (non operative). PMH includes neuropathy, DM, HTN, alcoholism, cirrhosis and pancreatitis.        Prior Functioning     Home Living Family/patient expects to be discharged to:: Skilled nursing facility Living Arrangements: Spouse/significant other Available Help at Discharge: Skilled Nursing Facility Prior Function Level of Independence: Independent with assistive device(s) Communication Communication: No difficulties Dominant Hand: Right         Vision/Perception Vision - History Patient Visual Report: No change from baseline   Cognition  Cognition Arousal/Alertness: Awake/alert Behavior During Therapy: WFL for tasks assessed/performed Overall Cognitive Status: Within Functional Limits for tasks assessed    Extremity/Trunk Assessment Upper Extremity Assessment Upper Extremity Assessment: Overall WFL for tasks assessed     Mobility Bed Mobility Bed Mobility: Scooting to HOB;Supine to Sit;Sit to Supine Supine to Sit: 5: Supervision;With rails;HOB elevated Sitting - Scoot to Edge of Bed: 5: Supervision;Other (comment) Sit to Supine: 5: Supervision;HOB elevated;With rail;Other  (comment) Scooting to [facility]: 5: Supervision;With trapeze;With rail Details for Bed Mobility Assistance:  pt able to advance LEs independently to EOB; was able to bring Lt LE off bed onto floor; used trapeze and bil UEs to advance Rt LE off EOB; pt was unable to bring Rt LE onto floor due to pain; pt required increased time to perform bed mobility; cues for deep breathing exercises to control pain; pt relies heavily on HOB being elevated and trapeze bars for bed mobility; pt able to scoot to [facility] with use of trapeze bar and WB through Lt LE Transfers Details for Transfer Assistance: Pt unable at this time           End of Session OT - End of Session Activity Tolerance: Patient limited by pain (however per PT and notes this is better than it has been) Patient left: in bed;with call bell/phone within reach       Evette Georges [phone] 04/15/2013, 4:15 PM

## 2013-04-15 NOTE — Progress Notes (Signed)
04/15/13 Received call from Gaylyn Rong at [facility],she requested updates. Faxed lastest MD progress note, epidural procedure note, and PT note.Received call from Jacki Cones, she stated that they are denying patient's transfer to acute hospital level at [facility].Informed patient of transfer denial and informed him that CSW is working with [facility] for possible SNF level bed.I encouraged patient to work with PT/OT as much as possible so that he can go home with therapy and discussed home therapy.Patient stated that he has a wheelchair, rolling walker and cane.Will continue to follow. Jacquelynn Cree RN, BSN, CCM     04/14/13 Patient requested transfer to [facility] facility on 04/12/13, spoke with Gaylyn Rong from [facility], she faxed tranfer forms, on 04/13/13 patient signed preference to transfer form that he chooses to transfer. I faxed the transfer preference form and the completed transfer forms to [phone] and confirmed receipt with Jacki Cones. Today received call from Jacki Cones, she stated that their physican had spoken with Dr. Isidoro Donning.They are denying the transfer currently but wll look at it again after patient receives the epidural injection. I will send updated MD notes and therapy notes when availalbe. Updated the patient and Dr. Isidoro Donning on status of transfer. Jacquelynn Cree RN, [facility]

## 2013-04-15 NOTE — Progress Notes (Signed)
Inpatient Diabetes Program Recommendations  AACE/ADA: New Consensus Statement on Inpatient Glycemic Control (2013)  Target Ranges:  Prepandial:   less than 140 mg/dL      Peak postprandial:   less than 180 mg/dL (1-2 hours)      Critically ill patients:  140 - 180 mg/dL   Reason for Visit: Results for DELVON, CHIPPS (MRN [id number]) as of 04/15/2013 15:14  Ref. Range 04/15/2013 07:12 04/15/2013 11:31 04/15/2013 12:04 04/15/2013 13:01  Glucose-Capillary Latest Range: 70-99 mg/dL 409 (H) 45 (L) 63 (L) 811 (H)   Note CBG elevated this morning.  Note that Lantus was not given according to documentation on 04/14/13 which likely contributed to elevated CBG.  Patient received 11 units of Novolog correction and 10 units of Novolog meal coverage this morning and then CBG dropped to 45 mg/dL.  Called and discussed with Dr. Isidoro Donning.  Recommend that a portion of Novolog meal coverage be restarted.  Orders received to restart Novolog 5 units tid with meals for meal coverage.  Discussed with RN also.

## 2013-04-15 NOTE — Clinical Social Work Placement (Addendum)
Clinical Social Work Department  CLINICAL SOCIAL WORK PLACEMENT NOTE  04/15/2013  Patient: [name] Account Number: [id number] Admit date: 04/09/13  Clinical Social Worker: Sabino Niemann MSW Date/time: 04/15/2013 9:30 AM  Clinical Social Work is seeking post-discharge placement for this patient at the following level of care: SKILLED NURSING (*CSW will update this form in Epic as items are completed)  04/15/2013 Patient/family provided with [facility] Department of Clinical Social Work's list of facilities offering this level of care within the geographic area requested by the patient (or if unable, by the patient's family).  04/15/2013 Patient/family informed of their freedom to choose among providers that offer the needed level of care, that participate in Medicare, Medicaid or managed care program needed by the patient, have an available bed and are willing to accept the patient.  04/15/2013 Patient/family informed of [facility]' ownership interest in [facility], as well as of the fact that they are under no obligation to receive care at this facility.  PASARR submitted to EDS on  PASARR number received from EDS on  FL2 transmitted to all facilities in geographic area requested by pt/family on  FL2 transmitted to all facilities within larger geographic area on  Patient informed that his/her managed care company has contracts with or will negotiate with certain facilities, including the following:  Patient/family informed of bed offers received:  Patient chooses bed at  Physician recommends and patient chooses bed at  Patient to be transferred to on  Patient to be transferred to facility by  The following physician request were entered in Epic:  Additional Comments:

## 2013-04-16 DIAGNOSIS — IMO0002 Reserved for concepts with insufficient information to code with codable children: Secondary | ICD-10-CM

## 2013-04-16 LAB — CBC
Hemoglobin: 13.5 g/dL (ref 13.0–17.0)
MCH: 35.2 pg — ABNORMAL HIGH (ref 26.0–34.0)
MCV: 100.3 fL — ABNORMAL HIGH (ref 78.0–100.0)
RBC: 3.84 MIL/uL — ABNORMAL LOW (ref 4.22–5.81)

## 2013-04-16 LAB — GLUCOSE, CAPILLARY: Glucose-Capillary: 89 mg/dL (ref 70–99)

## 2013-04-16 LAB — VITAMIN D 1,25 DIHYDROXY
Vitamin D 1, 25 (OH)2 Total: 56 pg/mL (ref 18–72)
Vitamin D2 1, 25 (OH)2: 38 pg/mL
Vitamin D3 1, 25 (OH)2: 18 pg/mL

## 2013-04-16 LAB — CREATININE, SERUM: Creatinine, Ser: 0.61 mg/dL (ref 0.50–1.35)

## 2013-04-16 MED ORDER — ALUM & MAG HYDROXIDE-SIMETH 200-200-20 MG/5ML PO SUSP
15.0000 mL | Freq: Four times a day (QID) | ORAL | Status: DC | PRN
  Filled 2013-04-16: qty 30

## 2013-04-16 MED ORDER — INSULIN ASPART 100 UNIT/ML ~~LOC~~ SOLN
8.0000 [IU] | Freq: Three times a day (TID) | SUBCUTANEOUS | Status: DC
  Administered 2013-04-16 – 2013-04-18 (×6): 8 [IU] via SUBCUTANEOUS

## 2013-04-16 MED ORDER — INSULIN GLARGINE 100 UNIT/ML ~~LOC~~ SOLN
50.0000 [IU] | Freq: Every day | SUBCUTANEOUS | Status: DC
  Administered 2013-04-17: 50 [IU] via SUBCUTANEOUS
  Filled 2013-04-16: qty 0.5

## 2013-04-16 NOTE — Progress Notes (Signed)
[facility] PROGRESS NOTE  BELMONT VALLI WUJ:[id number] DOB: [birth year] DOA: 04/09/2013 PCP: [name], MD   Brief narrative  51 year old male with history of diabetes type 2, hypertension, osteoporosis, chronic back pain with DJD presented with fall and severe right hip pain. Patient was sitting on elevated bar stool when he slipped and fell off the chair onto his right hip. He had immediate severe pain and was unable to move the leg or ambulate. Hip x-rays showed nondisplaced fracture of the inferior pubic rami. CT pelvis showed Nondisplaced fracture of the acetabulum on the right affecting the acetabular roof and the anterior column.  Orthopedics was consulted and recommended no surgical intervention, nonweightbearing on the right lower extremity, followup with orthopedics in 2 weeks.  Patient however continued to complain of severe pain, as well as  lower back pain thus an  MRI of the L-spine was done which showed bilateral right greater than left foraminal stenosis with broad-based disc extrusion, mild L4-L5 disc bulging without significant stenosis. This was discussed with Dr. Charlann Boxer who recommended epidural steroid injection and followup with orthopedics in 2 weeks.  Interventional radiology was consulted and patient underwent epidural steroid injection on 04/14/2013.  Patient was seen by physical therapy today after the epidural steroid injection and determined that patient needs skilled nursing facility for continued rehabilitation.   Assessment/Plan:  Principal Problem:  Right acetabulum fracture, acute on chronic low back pain  - nonoperative fracture per orthopedics  - pain control and physical therapy as per orthopedics recommendations   Lumbar disc bulging with  foraminal stenosis  - MRI of the L-spine showed bilateral right greater than left foraminal stenosis associated with broad-based disc extrusion, endplate osteophytes and facet arthrosis, mild L4-L5 disc bulging  without significant stenosis.  - Dr Isidoro Donning Discussed with Dr. Charlann Boxer [facility] orthopedics, patient had prior laminectomy surgery done by Dr. Leslee Home, 2nd in [place]) who also reviewed the scans. Dr. Charlann Boxer recommended either followup in the office in 2 weeks or if patient has significant pain hindering the physical therapy, he can have epidural steroid injection for relief. There is no need of acute surgical intervention at this moment.  - epidural steroid injection given on 8/21 and pain has improved. - PT eval recommends SNF   Chronic pain syndrome  -now worsened due to right acetabulum fracture  - Continue home dose of MS Contin 60 mg BID, oxycodone IR 15 q4hrs PRN, Neurontin, baclofen, IV dilaudid PRN  - Bowel regimen and PT   HTN -Stable - Continue home medication   Uncontrolled DM type 2 -   hemoglobin A1c is 7.6 - on meal coverage 5 units tid, increased Lantus to 45 units . fsg still elevated. Will increase lantus to 50 units pre meal to 10 units tid. Continue SSI    DVT Prophylaxis: sq lovenox   Diet: diabetic   Code Status: Full code Family Communication: None at bedside  Disposition Plan: Patient awaiting the will to [facility] for skilled nursing but was denied after several days. 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 Likely early next week   Consultants:  orthopedics  Procedures:  Lumbar Epidural injection for foraminal stenosis  Antibiotics:  None  HPI/Subjective: Patient seen and examined this morning. He reports being able to bear weight on his left leg but has difficulty moving his rt hip.   Objective: Filed Vitals:   04/16/13 0628  BP: 126/87  Pulse: 67  Temp: 98.8 F (37.1 C)  Resp: 16  Intake/Output Summary (Last 24 hours) at 04/16/13 1134 Last data filed at 04/16/13 0857  Gross per 24 hour  Intake    840 ml  Output    800 ml  Net     40 ml   There were no vitals filed for this visit.  Exam:   General:  Middle aged  male in no acute distress  HEENT: No pallor, moist oral mucosa  Chest: Clear to auscultation bilaterally, no added sounds  CVS: Normal S1 and S2, no murmurs rub or gallop  Abdomen: Soft, nontender, nondistended, bowel sounds present  Extremities: Warm, no edema, tender and limited range of movement of left hip  CNS: AAO x3  Data Reviewed: Basic Metabolic Panel:  Recent Labs Lab 04/16/13 0500  CREATININE 0.61   Liver Function Tests: No results found for this basename: AST, ALT, ALKPHOS, BILITOT, PROT, ALBUMIN,  in the last 168 hours No results found for this basename: LIPASE, AMYLASE,  in the last 168 hours No results found for this basename: AMMONIA,  in the last 168 hours CBC:  Recent Labs Lab 04/10/13 0622 04/11/13 0628 04/12/13 0430 04/13/13 0530 04/14/13 0500 04/15/13 0525 04/16/13 0500  WBC 7.3 8.5 10.0 9.4 8.6 10.0 9.1  NEUTROABS 5.4 6.0  --   --   --   --   --   HGB 14.4 14.9 15.3 14.2 13.6 14.0 13.5  HCT 41.7 42.5 44.2 42.4 40.2 41.3 38.5*  MCV 100.0 98.4 99.5 101.2* 100.8* 100.2* 100.3*  PLT 105* 118* 137* 137* 133* 164 184   Cardiac Enzymes: No results found for this basename: CKTOTAL, CKMB, CKMBINDEX, TROPONINI,  in the last 168 hours BNP (last 3 results) No results found for this basename: PROBNP,  in the last 8760 hours CBG:  Recent Labs Lab 04/15/13 1204 04/15/13 1301 04/15/13 1607 04/15/13 2102 04/16/13 0649  GLUCAP 63* 144* 208* 269* 345*    Recent Results (from the past 240 hour(s))  MRSA PCR SCREENING     Status: None   Collection Time    04/15/13  3:38 PM      Result Value Range Status   MRSA by PCR NEGATIVE  NEGATIVE Final   Comment:            The GeneXpert MRSA Assay (FDA     approved for NASAL specimens     only), is one component of a     comprehensive MRSA colonization     surveillance program. It is not     intended to diagnose MRSA     infection nor to guide or     monitor treatment for     MRSA infections.      Studies: Ir Transfor Epi L/s Sng W/fl/ct  04/14/2013   *RADIOLOGY REPORT*  CLINICAL DATA:Lumbosacral spondylosis without myelopathy. Displacement of the L5-S1 lumbar disc.  Right lower extremity radiculitis.  Right acetabular fracture.  SELECTIVE NERVE ROOT AND TRANSFORAMINAL EPIDURAL STEROID INJECTION: A transforaminal approach was performed on the right at the L5-S1 foramen.  The overlying skin was cleansed and anesthetized.  A 22 gauge spinal needle was advanced into the foramen from a lateral oblique approach.  Injection of 2cc of Omnipaque 180 outlined the nerve root and extended into the epidural space.  No vascular or intrathecal spread is evident.  I then injected 120 mg of Depo-Medrol and 1.5 ml of 1% lidocaine. The patient tolerated the procedure without evidence for complication.  The patient was observed for 20 minutes prior to discharge  in stable neurologic condition.  FLUORO TIME:  12 seconds  IMPRESSIONS:  Technically successful selective nerve root block and transforaminal epidural steroid injection on theright at the L5-S1 foramen.   Original Report Authenticated By: Marin Roberts, M.D.    Scheduled Meds: . baclofen  10 mg Oral TID  . docusate sodium  100 mg Oral BID  . enoxaparin (LOVENOX) injection  40 mg Subcutaneous Q24H  . ergocalciferol  50,000 Units Oral Weekly  . gabapentin  1,200 mg Oral TID  . insulin aspart  0-15 Units Subcutaneous TID WC  . insulin aspart  0-5 Units Subcutaneous QHS  . insulin aspart  5 Units Subcutaneous TID WC  . insulin glargine  40 Units Subcutaneous Daily  . living well with diabetes book   Does not apply Once  . morphine  60 mg Oral BID  . pantoprazole  20 mg Oral Daily  . ramipril  10 mg Oral Daily  . salsalate  750 mg Oral BID  . senna  1 tablet Oral BID  . sertraline  200 mg Oral Daily  . cyanocobalamin  100 mcg Oral Daily   Continuous Infusions:     Time spent: 25 minutes    Aerilynn Goin  [facility] Pager  [phone] If 7PM-7AM, please contact night-coverage at [email or web address], password [facility] 04/16/2013, 11:34 AM  LOS: 7 days

## 2013-04-17 DIAGNOSIS — S72009D Fracture of unspecified part of neck of unspecified femur, subsequent encounter for closed fracture with routine healing: Secondary | ICD-10-CM

## 2013-04-17 LAB — CBC
HCT: 43.3 % (ref 39.0–52.0)
MCHC: 32.8 g/dL (ref 30.0–36.0)
MCV: 101.2 fL — ABNORMAL HIGH (ref 78.0–100.0)
Platelets: 184 10*3/uL (ref 150–400)
RDW: 13.5 % (ref 11.5–15.5)

## 2013-04-17 LAB — GLUCOSE, CAPILLARY: Glucose-Capillary: 348 mg/dL — ABNORMAL HIGH (ref 70–99)

## 2013-04-17 MED ORDER — INSULIN GLARGINE 100 UNIT/ML ~~LOC~~ SOLN
60.0000 [IU] | Freq: Every day | SUBCUTANEOUS | Status: DC
  Administered 2013-04-18 – 2013-04-19 (×2): 60 [IU] via SUBCUTANEOUS
  Filled 2013-04-17 (×3): qty 0.6

## 2013-04-17 NOTE — Progress Notes (Signed)
[facility] PROGRESS NOTE  FENIX RORKE AOZ:[id number] DOB: Aug 08, 1961 DOA: 04/09/2013 PCP: [name], MD  Brief narrative  52 year old male with history of diabetes type 2, hypertension, osteoporosis, chronic back pain with DJD presented with fall and severe right hip pain. Patient was sitting on elevated bar stool when he slipped and fell off the chair onto his right hip. He had immediate severe pain and was unable to move the leg or ambulate. Hip x-rays showed nondisplaced fracture of the inferior pubic rami. CT pelvis showed Nondisplaced fracture of the acetabulum on the right affecting the acetabular roof and the anterior column.  Orthopedics was consulted and recommended no surgical intervention, nonweightbearing on the right lower extremity, followup with orthopedics in 2 weeks.  Patient however continued to complain of severe pain, as well as lower back pain thus an MRI of the L-spine was done which showed bilateral right greater than left foraminal stenosis with broad-based disc extrusion, mild L4-L5 disc bulging without significant stenosis. This was discussed with Dr. Charlann Boxer who recommended epidural steroid injection and followup with orthopedics in 2 weeks.  Interventional radiology was consulted and patient underwent epidural steroid injection on 04/14/2013.  Patient was seen by physical therapy  after the epidural steroid injection and determined that patient needs skilled nursing facility for continued rehabilitation.   Assessment/Plan:  Principal Problem:  Right acetabulum fracture, acute on chronic low back pain  - nonoperative fracture per orthopedics  - pain control and physical therapy as per orthopedics recommendations . -Patient on when necessary Dilaudid, baclofen and home dose of MS Contin and oxycodone.  Lumbar disc bulging with foraminal stenosis  - MRI of the L-spine showed bilateral right greater than left foraminal stenosis associated with broad-based  disc extrusion, endplate osteophytes and facet arthrosis, mild L4-L5 disc bulging without significant stenosis.  - Dr Isidoro Donning Discussed with Dr. Charlann Boxer [facility] orthopedics, patient had prior laminectomy surgery done by Dr. Leslee Home, 2nd in [place]) who also reviewed the scans. Dr. Charlann Boxer recommended either followup in the office in 2 weeks or if patient has significant pain hindering the physical therapy, he can have epidural steroid injection for relief. There is no need of acute surgical intervention at this moment.  - epidural steroid injection given on 8/21 and pain has improved.  - PT eval recommends SNF   Chronic pain syndrome  -now worsened due to right acetabulum fracture  - Continue home dose of MS Contin 60 mg BID, oxycodone IR 15 q4hrs PRN, Neurontin, baclofen, IV dilaudid PRN  - Bowel regimen and PT   HTN  -Stable  - Continue home medication   Uncontrolled DM type 2  - hemoglobin A1c is 7.6  - fsg still elevated like increasing lantus to 50 units pre meal insulin to 10 units tid. fsg greater than 300 usually in the mornings and stable during the day. i will increase bedtime lantus to 60 units . Continue SSI   DVT Prophylaxis: sq lovenox   Diet: diabetic   Code Status: Full code   Family Communication: None at bedside   Disposition Plan: [facility] but [facility] after several days. 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 Likely early next week.   Consultants:  Orthopedics  Procedures:  Lumbar Epidural injection for foraminal stenosis   Antibiotics:  None     Objective: Filed Vitals:   04/17/13 0642  BP: 118/84  Pulse: 66  Temp: 98.2 F (36.8 C)  Resp: 18  Intake/Output Summary (Last 24 hours) at 04/17/13 1002 Last data filed at 04/17/13 0939  Gross per 24 hour  Intake    960 ml  Output   1550 ml  Net   -590 ml   There were no vitals filed for this visit.  Exam:  General: Middle aged male in  no acute distress  HEENT: No pallor, moist oral mucosa  Chest: Clear to auscultation bilaterally, no added sounds  CVS: Normal S1 and S2, no murmurs rub or gallop  Abdomen: Soft, nontender, nondistended, bowel sounds present  Extremities: Warm, no edema, tender and limited range of movement of right  hip CNS: AAO x3   Data Reviewed: Basic Metabolic Panel:  Recent Labs Lab 04/16/13 0500  CREATININE 0.61   Liver Function Tests: No results found for this basename: AST, ALT, ALKPHOS, BILITOT, PROT, ALBUMIN,  in the last 168 hours No results found for this basename: LIPASE, AMYLASE,  in the last 168 hours No results found for this basename: AMMONIA,  in the last 168 hours CBC:  Recent Labs Lab 04/11/13 0628  04/13/13 0530 04/14/13 0500 04/15/13 0525 04/16/13 0500 04/17/13 0500  WBC 8.5  < > 9.4 8.6 10.0 9.1 8.3  NEUTROABS 6.0  --   --   --   --   --   --   HGB 14.9  < > 14.2 13.6 14.0 13.5 14.2  HCT 42.5  < > 42.4 40.2 41.3 38.5* 43.3  MCV 98.4  < > 101.2* 100.8* 100.2* 100.3* 101.2*  PLT 118*  < > 137* 133* 164 184 184  < > = values in this interval not displayed. Cardiac Enzymes: No results found for this basename: CKTOTAL, CKMB, CKMBINDEX, TROPONINI,  in the last 168 hours BNP (last 3 results) No results found for this basename: PROBNP,  in the last 8760 hours CBG:  Recent Labs Lab 04/16/13 0649 04/16/13 1031 04/16/13 1639 04/16/13 2142 04/17/13 0645  GLUCAP 345* 126* 89 185* 348*    Recent Results (from the past 240 hour(s))  MRSA PCR SCREENING     Status: None   Collection Time    04/15/13  3:38 PM      Result Value Range Status   MRSA by PCR NEGATIVE  NEGATIVE Final   Comment:            The GeneXpert MRSA Assay (FDA     approved for NASAL specimens     only), is one component of a     comprehensive MRSA colonization     surveillance program. It is not     intended to diagnose MRSA     infection nor to guide or     monitor treatment for     MRSA  infections.     Studies: No results found.  Scheduled Meds: . baclofen  10 mg Oral TID  . docusate sodium  100 mg Oral BID  . enoxaparin (LOVENOX) injection  40 mg Subcutaneous Q24H  . ergocalciferol  50,000 Units Oral Weekly  . gabapentin  1,200 mg Oral TID  . insulin aspart  0-15 Units Subcutaneous TID WC  . insulin aspart  0-5 Units Subcutaneous QHS  . insulin aspart  8 Units Subcutaneous TID WC  . insulin glargine  50 Units Subcutaneous Daily  . living well with diabetes book   Does not apply Once  . morphine  60 mg Oral BID  . pantoprazole  20 mg Oral Daily  . ramipril  10 mg Oral  Daily  . salsalate  750 mg Oral BID  . senna  1 tablet Oral BID  . sertraline  200 mg Oral Daily  . cyanocobalamin  100 mcg Oral Daily   Continuous Infusions:      Time spent: 25 minutes    Masa Lubin  [facility] Pager [phone]. If 7PM-7AM, please contact night-coverage at [email or web address], password [facility] 04/17/2013, 10:02 AM  LOS: 8 days

## 2013-04-18 LAB — GLUCOSE, CAPILLARY
Glucose-Capillary: 199 mg/dL — ABNORMAL HIGH (ref 70–99)
Glucose-Capillary: 181 mg/dL — ABNORMAL HIGH (ref 70–99)

## 2013-04-18 MED ORDER — INSULIN ASPART 100 UNIT/ML ~~LOC~~ SOLN
12.0000 [IU] | Freq: Three times a day (TID) | SUBCUTANEOUS | Status: DC
  Administered 2013-04-18: 5 [IU] via SUBCUTANEOUS
  Administered 2013-04-19 (×2): 12 [IU] via SUBCUTANEOUS

## 2013-04-18 NOTE — Progress Notes (Addendum)
Occupational Therapy Treatment Patient Details Name: [name] MRN: [id number] DOB: [birth year] Today's Date: 04/18/2013 Time: 1478-2956 OT Time Calculation (min): 14 min  OT Assessment / Plan / Recommendation  History of present illness Pt received injection in L5-S1 8/21 to help manage pain; pt adm due to fall from barstool resulting in Rt nondisplaced acetabular fx (non operative). PMH includes neuropathy, DM, HTN, alcoholism, cirrhosis and pancreatitis.    OT comments  Pt progressing well. Pt ambulated from bed to bathroom and then back to bed. Pt performed UE theraband exercises while sitting EOB. Recommending SNF for rehab prior to return home.   Follow Up Recommendations  SNF    Barriers to Discharge       Equipment Recommendations  None recommended by OT    Recommendations for Other Services    Frequency Min 2X/week   Progress towards OT Goals    Plan Discharge plan remains appropriate    Precautions / Restrictions Precautions Precautions: Fall Restrictions Weight Bearing Restrictions: Yes RLE Weight Bearing: Touchdown weight bearing   Pertinent Vitals/Pain 8.5/10 in Rt groin area; pt premedicated per RN      ADL  Toilet Transfer: Simulated;Min guard Toilet Transfer Method: Sit to Barista: Other (comment) (from bed) Equipment Used: Gait belt;Rolling walker Transfers/Ambulation Related to ADLs: Min guard for ambulation and transfers. ADL Comments: Pt ambulated to bathroom and then back to bed. Cues to be sure pt is maintaining weightbearing status of RLE. Pt performed theraband UE exercises sitting EOB. OT also educated on chair push ups to increase UB strength. Performed UE theraband exercises approximately 10x's each.    OT Diagnosis:    OT Problem List:   OT Treatment Interventions:     OT Goals(current goals can now be found in the care plan section) Acute Rehab OT Goals Patient Stated Goal: to go to rehab and then home OT Goal  Formulation: With patient Time For Goal Achievement: 04/29/13 Potential to Achieve Goals: Good ADL Goals Pt Will Transfer to Toilet: with +2 assist;squat pivot transfer;stand pivot transfer;bedside commode (pt 30%) Additional ADL Goal #1: Pt will be able to come up to sit with both feet on the floor from supine HOB up and use rails and/or trapeze with S in prep for transfers Additional ADL Goal #2: Pt will be Independent with level 2 theraband exercises to increase/maintain strength in Bil UEs  Visit Information  Last OT Received On: 04/18/13 Assistance Needed: +1 PT/OT Co-Evaluation/Treatment: Yes History of Present Illness: Pt received injection in L5-S1 8/21 to help manage pain; pt adm due to fall from barstool resulting in Rt nondisplaced acetabular fx (non operative). PMH includes neuropathy, DM, HTN, alcoholism, cirrhosis and pancreatitis.     Subjective Data      Prior Functioning       Cognition  Cognition Arousal/Alertness: Awake/alert Behavior During Therapy: WFL for tasks assessed/performed Overall Cognitive Status: Within Functional Limits for tasks assessed    Mobility  Bed Mobility Bed Mobility: Supine to Sit;Sit to Supine Supine to Sit: 5: Supervision;HOB elevated;With rails Sit to Supine: 5: Supervision;HOB elevated Details for Bed Mobility Assistance: supervision for safety; relies heavily on rails and trapeze bar for bed mobility; HOB elevated to ~40 degrees; pt uses UEs to advance Rt LE to/off EOB  Transfers Transfers: Sit to Stand;Stand to Sit Sit to Stand: 4: Min guard;From bed Stand to Sit: To bed;4: Min guard Details for Transfer Assistance: cues to maintain TWB status    Exercises  General Exercises -  Upper Extremity Shoulder Flexion: AROM;Both;10 reps;Seated;Theraband Theraband Level (Shoulder Flexion): Level 3 (Green) Shoulder Extension: AROM;Both;10 reps;Seated;Theraband Theraband Level (Shoulder Extension): Level 3 (Green) Elbow Flexion:  AROM;Both;10 reps;Seated;Theraband Theraband Level (Elbow Flexion): Level 3 (Green) Elbow Extension: AROM;Both;10 reps;Seated;Theraband Theraband Level (Elbow Extension): Level 3 (Green)   Balance Balance Balance Assessed: Yes Dynamic Sitting Balance Dynamic Sitting - Balance Support: No upper extremity supported;Feet supported Dynamic Sitting - Level of Assistance: 5: Stand by assistance Dynamic Sitting - Comments: tolerated sitting EOB to perform UE exercises    End of Session OT - End of Session Equipment Utilized During Treatment: Gait belt;Rolling walker Activity Tolerance: Patient tolerated treatment well Patient left: in bed;with call bell/phone within reach  [employer] OTR/L [phone] 04/18/2013, 5:50 PM

## 2013-04-18 NOTE — Progress Notes (Signed)
Pt refused am lab(CBC).

## 2013-04-18 NOTE — Progress Notes (Signed)
CSW met with patient today and provided him with SNF bed offers. Earlier this morning- [facility] representative requested additional information to consider patient for a SNF bed. This information was sent to the rep- but later in day she stated she did not receive it. Information re-sent.  Active bed search for SNF bed provided 4 bed offers. Patient is requesting [facility] in [place].  Notified Kelly in Admissions and she initiated [facility].  CSW will need to follow up with [facility] rep and [facility] tomorrow morning to determine which option is available. Patient notified that pending bed availability and stability - that he would be discharged. CSW received a call from patient's wife Racheal- [phone]. Discussed above options and she will talk with patient tonight.  CSW will follow up in the a.m.  Lorri Frederick. [place]  [phone]

## 2013-04-18 NOTE — Progress Notes (Signed)
[facility] PROGRESS NOTE  [name] ZOX:[id number] DOB: 06-Apr-1961 DOA: 04/09/2013 PCP: [name], MD  Brief narrative  52 year old male with history of diabetes type 2, hypertension, osteoporosis, chronic back pain with DJD presented with fall and severe right hip pain. Patient was sitting on elevated bar stool when he slipped and fell off the chair onto his right hip. He had immediate severe pain and was unable to move the leg or ambulate. Hip x-rays showed nondisplaced fracture of the inferior pubic rami. CT pelvis showed Nondisplaced fracture of the acetabulum on the right affecting the acetabular roof and the anterior column.  Orthopedics was consulted and recommended no surgical intervention, nonweightbearing on the right lower extremity, followup with orthopedics in 2 weeks.  Patient however continued to complain of severe pain, as well as lower back pain thus an MRI of the L-spine was done which showed bilateral right greater than left foraminal stenosis with broad-based disc extrusion, mild L4-L5 disc bulging without significant stenosis. This was discussed with Dr. Charlann Boxer who recommended epidural steroid injection and followup with orthopedics in 2 weeks.  Interventional radiology was consulted and patient underwent epidural steroid injection on 04/14/2013.  Patient was seen by physical therapy after the epidural steroid injection and determined that patient needs skilled nursing facility for continued rehabilitation.   Assessment/Plan:  Principal Problem:  Right acetabulum fracture, acute on chronic low back pain  - nonoperative fracture per orthopedics  - pain control and physical therapy as per orthopedics recommendations .  -Patient on when necessary Dilaudid, baclofen and home dose of MS Contin and oxycodone.   Lumbar disc bulging with foraminal stenosis  - MRI of the L-spine showed bilateral right greater than left foraminal stenosis associated with broad-based  disc extrusion, endplate osteophytes and facet arthrosis, mild L4-L5 disc bulging without significant stenosis.  - Dr Isidoro Donning Discussed with Dr. Charlann Boxer [facility] orthopedics, patient had prior laminectomy surgery done by Dr. Leslee Home, 2nd in [place]) who also reviewed the scans. Dr. Charlann Boxer recommended either followup in the office in 2 weeks or if patient has significant pain hindering the physical therapy, he can have epidural steroid injection for relief. There is no need of acute surgical intervention at this moment.  - epidural steroid injection given on 8/21 and pain has improved.  - PT eval recommends SNF .  Chronic pain syndrome  -now worsened due to right acetabulum fracture  - Continue home dose of MS Contin 60 mg BID, oxycodone IR 15 q4hrs PRN, Neurontin, baclofen, IV dilaudid PRN  - Bowel regimen and PT   HTN  -Stable  - Continue home medication   Uncontrolled DM type 2  - hemoglobin A1c is 7.6  - fsg better after  increasing lantus to 60 units and pre meal insulin to 10 units tid. Will increase premeal insulin further to 12 untis tid.  Continue SSI   DVT Prophylaxis: sq lovenox   Diet: diabetic   Code Status: Full code   Family Communication: None at bedside   [facility]: [facility] but [facility] after several days. 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  Consultants:  Orthopedics  Procedures:  Lumbar Epidural injection for foraminal stenosis Antibiotics:  None  HPI/Subjective: Patient seen and examined today. No overnight issues. Able to bear some weight over left with support.   Objective: Filed Vitals:   04/18/13 0638  BP: 138/84  Pulse: 66  Temp: 97.9 F (36.6 C)  Resp: 18  Intake/Output Summary (Last 24 hours) at 04/18/13 1303 Last data filed at 04/18/13 1610  Gross per 24 hour  Intake    720 ml  Output    600 ml  Net    120 ml   There were no vitals filed for this visit.  Exam: General:  Middle aged male in no acute distress  HEENT: No pallor, moist oral mucosa  Chest: Clear to auscultation bilaterally, no added sounds  CVS: Normal S1 and S2, no murmurs rub or gallop  Abdomen: Soft, nontender, nondistended, bowel sounds present  Extremities: Warm, no edema, tender and limited range of movement of right hip CNS: AAO x3   Data Reviewed: Basic Metabolic Panel:  Recent Labs Lab 04/16/13 0500  CREATININE 0.61   Liver Function Tests: No results found for this basename: AST, ALT, ALKPHOS, BILITOT, PROT, ALBUMIN,  in the last 168 hours No results found for this basename: LIPASE, AMYLASE,  in the last 168 hours No results found for this basename: AMMONIA,  in the last 168 hours CBC:  Recent Labs Lab 04/13/13 0530 04/14/13 0500 04/15/13 0525 04/16/13 0500 04/17/13 0500  WBC 9.4 8.6 10.0 9.1 8.3  HGB 14.2 13.6 14.0 13.5 14.2  HCT 42.4 40.2 41.3 38.5* 43.3  MCV 101.2* 100.8* 100.2* 100.3* 101.2*  PLT 137* 133* 164 184 184   Cardiac Enzymes: No results found for this basename: CKTOTAL, CKMB, CKMBINDEX, TROPONINI,  in the last 168 hours BNP (last 3 results) No results found for this basename: PROBNP,  in the last 8760 hours CBG:  Recent Labs Lab 04/17/13 1102 04/17/13 1635 04/17/13 2328 04/18/13 0658 04/18/13 1049  GLUCAP 353* 174* 142* 199* 181*    Recent Results (from the past 240 hour(s))  MRSA PCR SCREENING     Status: None   Collection Time    04/15/13  3:38 PM      Result Value Range Status   MRSA by PCR NEGATIVE  NEGATIVE Final   Comment:            The GeneXpert MRSA Assay (FDA     approved for NASAL specimens     only), is one component of a     comprehensive MRSA colonization     surveillance program. It is not     intended to diagnose MRSA     infection nor to guide or     monitor treatment for     MRSA infections.     Studies: No results found.  Scheduled Meds: . baclofen  10 mg Oral TID  . docusate sodium  100 mg Oral BID  .  enoxaparin (LOVENOX) injection  40 mg Subcutaneous Q24H  . ergocalciferol  50,000 Units Oral Weekly  . gabapentin  1,200 mg Oral TID  . insulin aspart  0-15 Units Subcutaneous TID WC  . insulin aspart  0-5 Units Subcutaneous QHS  . insulin aspart  8 Units Subcutaneous TID WC  . insulin glargine  60 Units Subcutaneous Daily  . living well with diabetes book   Does not apply Once  . morphine  60 mg Oral BID  . pantoprazole  20 mg Oral Daily  . ramipril  10 mg Oral Daily  . salsalate  750 mg Oral BID  . senna  1 tablet Oral BID  . sertraline  200 mg Oral Daily  . cyanocobalamin  100 mcg Oral Daily   Continuous Infusions:     Time spent: 25 minutes    Chabeli Barsamian  [facility] Pager [phone]. If 7PM-7AM, please contact night-coverage at [email or web address], password [facility] 04/18/2013, 1:03 PM  LOS: 9 days

## 2013-04-18 NOTE — Progress Notes (Signed)
Physical Therapy Treatment Patient Details Name: [name] MRN: [id number] DOB: [birth year] Today's Date: 04/18/2013 Time: 4098-1191 PT Time Calculation (min): 12 min  PT Assessment / Plan / Recommendation  History of Present Illness Pt received injection in L5-S1 8/21 to help manage pain; pt adm due to fall from barstool resulting in Rt nondisplaced acetabular fx (non operative). PMH includes neuropathy, DM, HTN, alcoholism, cirrhosis and pancreatitis.    PT Comments   Pt highly motivated for therapy today. Agreeable to amb to/from bathroom with RW. Min guard safety for mobility due to TWB status and cues for safety. Pt to benefit from ST rehab prior to returning home with wife.   Follow Up Recommendations  Supervision/Assistance - 24 hour;SNF;Other (comment)     Does the patient have the potential to tolerate intense rehabilitation     Barriers to Discharge        Equipment Recommendations  Rolling walker with 5" wheels;3in1 (PT);Wheelchair (measurements PT);Wheelchair cushion (measurements PT);Hospital bed    Recommendations for Other Services    Frequency Min 3X/week   Progress towards PT Goals Progress towards PT goals: Progressing toward goals  Plan Current plan remains appropriate    Precautions / Restrictions Precautions Precautions: Fall Restrictions Weight Bearing Restrictions: Yes RLE Weight Bearing: Touchdown weight bearing   Pertinent Vitals/Pain 8.5/10 in Rt groin area; pt premedicated per RN     Mobility  Bed Mobility Bed Mobility: Supine to Sit Supine to Sit: 5: Supervision;HOB elevated;With rails Details for Bed Mobility Assistance: supervision for safety; relies heavily on rails and trapeze bar for bed mobility; HOB elevated to ~40 degrees; pt uses UEs to advance Rt LE to/off EOB  Transfers Transfers: Sit to Stand;Stand to Sit Sit to Stand: 4: Min guard;From bed Stand to Sit: To bed;4: Min guard Details for Transfer Assistance: min cues to maintain  TWB status and cues for hand placement and safety  Ambulation/Gait Ambulation/Gait Assistance: 4: Min guard Ambulation Distance (Feet): 14 Feet Assistive device: Rolling walker Ambulation/Gait Assistance Details: min cues for gt sequencing and safety with RW; pt min guard for safety and to maintain TWB status on Rt LE; pt fatigued quickly and c/o pain 8.5/10 in Rt groin area  Gait Pattern: Step-to pattern Gait velocity: decreased Stairs: No Wheelchair Mobility Wheelchair Mobility: No    Exercises General Exercises - Lower Extremity Ankle Circles/Pumps: AROM;Both;10 reps;Seated Long Arc Quad: AROM;10 reps;Right;Seated   PT Diagnosis:    PT Problem List:   PT Treatment Interventions:     PT Goals (current goals can now be found in the care plan section) Acute Rehab PT Goals Patient Stated Goal: to go to rehab and then home PT Goal Formulation: With patient Time For Goal Achievement: 04/19/13 Potential to Achieve Goals: Good  Visit Information  Last PT Received On: 04/18/13 Assistance Needed: +1 PT/OT Co-Evaluation/Treatment: Yes History of Present Illness: Pt received injection in L5-S1 8/21 to help manage pain; pt adm due to fall from barstool resulting in Rt nondisplaced acetabular fx (non operative). PMH includes neuropathy, DM, HTN, alcoholism, cirrhosis and pancreatitis.     Subjective Data  Subjective: Pt much more motivated today with therapy. States "ive been practicing with my wife for you guys all weekend."  Patient Stated Goal: to go to rehab and then home   Cognition       Balance  Balance Balance Assessed: Yes Static Sitting Balance Static Sitting - Balance Support: No upper extremity supported;Feet supported Static Sitting - Level of Assistance: 5: Stand by  assistance Static Sitting - Comment/# of Minutes: tolerated sitting EOB ~4 min to perform exercises   End of Session PT - End of Session Equipment Utilized During Treatment: Gait belt Activity  Tolerance: Patient tolerated treatment well Patient left: in bed;with call bell/phone within reach;Other (comment) (OT present ) Nurse Communication: Mobility status   GP     Donell Sievert, [place] [phone] 04/18/2013, 11:01 AM

## 2013-04-19 ENCOUNTER — Encounter (HOSPITAL_COMMUNITY): Payer: Self-pay | Admitting: General Practice

## 2013-04-19 DIAGNOSIS — M48061 Spinal stenosis, lumbar region without neurogenic claudication: Secondary | ICD-10-CM | POA: Diagnosis present

## 2013-04-19 LAB — GLUCOSE, CAPILLARY
Glucose-Capillary: 210 mg/dL — ABNORMAL HIGH (ref 70–99)
Glucose-Capillary: 150 mg/dL — ABNORMAL HIGH (ref 70–99)

## 2013-04-19 MED ORDER — AMLODIPINE BESYLATE 5 MG PO TABS: 5.0000 mg | ORAL_TABLET | Freq: Every day | ORAL | Status: DC

## 2013-04-19 MED ORDER — ALUM & MAG HYDROXIDE-SIMETH 200-200-20 MG/5ML PO SUSP: 15.0000 mL | Freq: Four times a day (QID) | ORAL | Status: DC | PRN

## 2013-04-19 MED ORDER — INSULIN GLARGINE 100 UNIT/ML ~~LOC~~ SOLN: 60.0000 [IU] | Freq: Every day | SUBCUTANEOUS | Status: DC

## 2013-04-19 MED ORDER — INSULIN ASPART 100 UNIT/ML ~~LOC~~ SOLN: 12.0000 [IU] | Freq: Three times a day (TID) | SUBCUTANEOUS | Status: DC

## 2013-04-19 MED ORDER — HYDROMORPHONE HCL 2 MG PO TABS: 2.0000 mg | ORAL_TABLET | Freq: Four times a day (QID) | ORAL | Status: DC | PRN

## 2013-04-19 MED ORDER — AMLODIPINE BESYLATE 5 MG PO TABS
5.0000 mg | ORAL_TABLET | Freq: Every day | ORAL | Status: DC
  Administered 2013-04-19: 5 mg via ORAL
  Filled 2013-04-19 (×2): qty 1

## 2013-04-19 MED ORDER — SENNA 8.6 MG PO TABS: 1.0000 | ORAL_TABLET | Freq: Every day | ORAL | Status: DC | PRN

## 2013-04-19 NOTE — Progress Notes (Signed)
04/19/13 Patient informmed CSW that he would rather go home instead of going to SNF for therapy.Spoke with patient and his wife about difference beteen SNF and HHPT/OT. They stated that they would rather go home with [facility]. They chose [facility] from [facility] agencies list. Dava Najjar with [facility] and set up HHPT, HHOT and HHRN. Patient stated that he has a rolling walker, cane, wheelchair and BSC at home. Jacquelynn Cree RN, BSN, CCM

## 2013-04-19 NOTE — Progress Notes (Signed)
Patient and wife given discharge instructions and prescriptions; all questions answered.  Patient discharged home with wife.  Patient escorted via wheelchair by NT to wife's car.

## 2013-04-19 NOTE — Discharge Summary (Addendum)
Physician Discharge Summary  TAZ VANNESS VHQ:[id number] DOB: 08/03/1961 DOA: 04/09/2013  PCP: [name], MD  Admit date: 04/09/2013 Discharge date: 04/19/2013  Time spent: 40 minutes  Recommendations for Outpatient Follow-up:  1. Home with home health RN., PT OT 2. Follow up with PCP in 1 week 3.  please follow up with Dr Charlann Boxer ( orthopedics ) in 2 weeks  Discharge Diagnoses:  Principal Problem:   Acetabulum fracture, right  Active Problems:  Type II or unspecified type diabetes mellitus with neurological manifestations, not stated as uncontrolled(250.60)   Lumbar foraminal stenosis   HYPERTENSION   ALCOHOLIC HEPATITIS   CIRRHOSIS, ALCOHOLIC   OSTEOPOROSIS   Hepatomegaly        Discharge Condition: FAIR  Diet recommendation: diabetic  There were no vitals filed for this visit.  History of present illness:  Please refer to admission H&P for details, but in brief, 52 year old male with history of diabetes type 2, hypertension, osteoporosis, chronic back pain with DJD presented with fall and severe right hip pain. Patient was sitting on elevated bar stool when he slipped and fell off the chair onto his right hip. He had immediate severe pain and was unable to move the leg or ambulate. Hip x-rays showed nondisplaced fracture of the inferior pubic rami. CT pelvis showed Nondisplaced fracture of the acetabulum on the right affecting the acetabular roof and the anterior column.  Orthopedics was consulted and recommended no surgical intervention, nonweightbearing on the right lower extremity, followup with orthopedics in 2 weeks.  Patient however continued to complain of severe pain, as well as lower back pain thus an MRI of the L-spine was done which showed bilateral right greater than left foraminal stenosis with broad-based disc extrusion, mild L4-L5 disc bulging without significant stenosis. This was discussed with Dr. Charlann Boxer who recommended epidural steroid injection and  followup with orthopedics in 2 weeks.  Interventional radiology was consulted and patient underwent epidural steroid injection on 04/14/2013.     Hospital Course:  Right acetabulum fracture, acute on chronic low back pain  - nonoperative fracture per orthopedics  - pain control and physical therapy as per orthopedics recommendations .  -Patient on when necessary Dilaudid, baclofen and home dose of MS Contin and oxycodone. improving and able to walk with a walker with minimal weight bearing on rt leg.  Will discharge on home pain meds and few days of po dilaudid prn. Follow up with orthopedics in 2 weeks  Lumbar disc bulging with foraminal stenosis  - MRI of the L-spine showed bilateral right greater than left foraminal stenosis associated with broad-based disc extrusion, endplate osteophytes and facet arthrosis, mild L4-L5 disc bulging without significant stenosis.  - Dr Isidoro Donning Discussed with Dr. Charlann Boxer [facility] orthopedics, patient had prior laminectomy surgery done by Dr. Leslee Home, 2nd in [place]) who also reviewed the scans. Dr. Charlann Boxer recommended either followup in the office in 2 weeks or if patient has significant pain hindering the physical therapy, he can have epidural steroid injection for relief. recommended there here was no need of acute surgical intervention at this moment.  - epidural steroid injection given on 8/21 and pain has improved.  - PT eval recommended SNF but given patient had difficulty finding a SNF and clinical improvement, he appears safe to go home with home health and 24 hrs supervision which he and his wife are abel to  provide.  .  Chronic pain syndrome  - Continue home dose of MS Contin 60 mg BID, oxycodone IR  15 q4hrs PRN, Neurontin, baclofen, IV dilaudid PRN  -added  Bowel regimen. Needs outpt  PT / OT  HTN  -Elevated. Added low dose amlodipine. - Continue ramipril  Uncontrolled DM type 2  - hemoglobin A1c is 7.6  - fsg better after increasing lantus to  60 units and pre meal insulin to 12 units tid.  -follow up with PCP. Counseled on diet adherence and exercise as tolerated.   Diet: diabetic  Code Status: Full code  Family Communication: wife at bedside  Disposition Plan: home with [facility]  Consultants:  Orthopedics    Procedures:  Lumbar Epidural injection for foraminal stenosis   Antibiotics:  None    Discharge Exam: Filed Vitals:   04/19/13 1412  BP: 129/88  Pulse: 85  Temp: 98.3 F (36.8 C)  Resp: 20    General: Middle aged male in no acute distress  HEENT: No pallor, moist oral mucosa  Chest: Clear to auscultation bilaterally, no added sounds  CVS: Normal S1 and S2, no murmurs rub or gallop  Abdomen: Soft, nontender, nondistended, bowel sounds present  Extremities: Warm, no edema, tender and limited range of movement of right hip CNS: AAO x3   Discharge Instructions     Medication List    STOP taking these medications       Insulin Pen Needle 31G X 8 MM Misc      TAKE these medications       alum & mag hydroxide-simeth 200-200-20 MG/5ML suspension  Commonly known as:  MAALOX/MYLANTA  Take 15 mLs by mouth every 6 (six) hours as needed for indigestion.     amLODipine 5 MG tablet  Commonly known as:  NORVASC  Take 1 tablet (5 mg total) by mouth daily.     baclofen 10 MG tablet  Commonly known as:  LIORESAL  Take 10 mg by mouth 3 (three) times daily.     cyanocobalamin 1000 MCG tablet  Take 100 mcg by mouth daily.     ergocalciferol 50000 UNITS capsule  Commonly known as:  VITAMIN D2  Take 50,000 Units by mouth once a week. On Saturday     gabapentin 400 MG capsule  Commonly known as:  NEURONTIN  Take 1,200 mg by mouth 3 (three) times daily.     HYDROmorphone 2 MG tablet  Commonly known as:  DILAUDID  Take 1 tablet (2 mg total) by mouth every 6 (six) hours as needed for pain.     insulin aspart 100 UNIT/ML injection  Commonly known as:  novoLOG  Inject 12 Units into the skin 3 (three)  times daily before meals.     insulin glargine 100 UNIT/ML injection  Commonly known as:  LANTUS  Inject 0.6 mLs (60 Units total) into the skin daily.     morphine 60 MG 12 hr tablet  Commonly known as:  MS CONTIN  Take 60 mg by mouth 2 (two) times daily.     oxyCODONE 5 MG immediate release tablet  Commonly known as:  Oxy IR/ROXICODONE  Take 10 mg by mouth every 4 (four) hours as needed for pain.     pantoprazole 20 MG tablet  Commonly known as:  PROTONIX  Take 20 mg by mouth daily.     ramipril 10 MG capsule  Commonly known as:  ALTACE  Take 1 capsule (10 mg total) by mouth daily.     salsalate 750 MG tablet  Commonly known as:  DISALCID  Take 750 mg by mouth 2 (two) times  daily.     senna 8.6 MG Tabs tablet  Commonly known as:  SENOKOT  Take 1 tablet (8.6 mg total) by mouth daily as needed.     sertraline 100 MG tablet  Commonly known as:  ZOLOFT  Take 200 mg by mouth daily.       Allergies  Allergen Reactions  . Ketorolac Tromethamine     Hives (toradol)       Follow-up Information   Follow up with [facility]. (home physical and occupational therpay, they will contact you)    Contact information:   [address] [place] [address] [phone]       Follow up with [name], MD In 1 week.   Specialty:  Family Medicine   Contact information:   [address] [place] [address] [phone]        The results of significant diagnostics from this hospitalization (including imaging, microbiology, ancillary and laboratory) are listed below for reference.    Significant Diagnostic Studies: Ct Lumbar Spine Wo Contrast  04/09/2013   *RADIOLOGY REPORT*  Clinical data:  The fall with back and right hip pain.  Negative radiographs.  CT LUMBAR SPINE WITHOUT CONTRAST  Technique:  Multidetector CT imaging of the lumbar spine was performed without intravenous contrast administration.  Multiplanar CT image  reconstructions were also generated.  Comparison:  Radiography same day.  CT abdomen 08/06/2007.  Findings:  Alignment is normal.  There are old Schmorl's node defects at the superior endplate of T12.  There are newly seen but chronic appearing Schmorl's node defects at the inferior end plate of L5, more on the right than the left.  These are chronic based on surrounding sclerotic change.  No acute fracture of the spine is evident.  There are some degenerative changes of the sacroiliac joints, right more than left.  There is bulging of the disc at L4- 5.  There is shallow broad-based protrusion of the disc at L5-S1. There is mild lower lumbar facet arthropathy.  There is atherosclerosis of the aorta and its branch vessels but no evidence of aneurysm.  IMPRESSION: No acute finding in the lumbar region.  Chronic end plate Schmorl's nodes at T12 and L5.  Sacroiliac degeneration right more than left.  Disc bulge at L4-5.  Shallow disc protrusion at L5-S1.  CT PELVIS WITHOUT CONTRAST  Technique:  Multidetector CT imaging of the pelvis was performed following the standard protocol without intravenous contrast.   Findings:  There is a nondisplaced acetabular fracture on the right involving the acetabular roof and the anterior column.  No fracture of the femur is identified.  No other pelvic fracture.  No soft tissue hematoma.  IMPRESSION: Nondisplaced fractures of the acetabulum on the right affecting the acetabular roof and the anterior column.   Original Report Authenticated By: Paulina Fusi, M.D.   Ct Pelvis Wo Contrast  04/09/2013   *RADIOLOGY REPORT*  Clinical data:  The fall with back and right hip pain.  Negative radiographs.  CT LUMBAR SPINE WITHOUT CONTRAST  Technique:  Multidetector CT imaging of the lumbar spine was performed without intravenous contrast administration.  Multiplanar CT image reconstructions were also generated.  Comparison:  Radiography same day.  CT abdomen 08/06/2007.  Findings:  Alignment  is normal.  There are old Schmorl's node defects at the superior endplate of T12.  There are newly seen but chronic appearing Schmorl's node defects at the inferior end plate of L5, more on the right than  the left.  These are chronic based on surrounding sclerotic change.  No acute fracture of the spine is evident.  There are some degenerative changes of the sacroiliac joints, right more than left.  There is bulging of the disc at L4- 5.  There is shallow broad-based protrusion of the disc at L5-S1. There is mild lower lumbar facet arthropathy.  There is atherosclerosis of the aorta and its branch vessels but no evidence of aneurysm.  IMPRESSION: No acute finding in the lumbar region.  Chronic end plate Schmorl's nodes at T12 and L5.  Sacroiliac degeneration right more than left.  Disc bulge at L4-5.  Shallow disc protrusion at L5-S1.  CT PELVIS WITHOUT CONTRAST  Technique:  Multidetector CT imaging of the pelvis was performed following the standard protocol without intravenous contrast.   Findings:  There is a nondisplaced acetabular fracture on the right involving the acetabular roof and the anterior column.  No fracture of the femur is identified.  No other pelvic fracture.  No soft tissue hematoma.  IMPRESSION: Nondisplaced fractures of the acetabulum on the right affecting the acetabular roof and the anterior column.   Original Report Authenticated By: Paulina Fusi, M.D.   Mr Lumbar Spine W Wo Contrast  04/13/2013   *RADIOLOGY REPORT*  Clinical Data: Acute on chronic back pain radiating to the right leg.  History of multiple wall back operations.  MRI LUMBAR SPINE WITHOUT AND WITH CONTRAST  Technique:  Multiplanar and multiecho pulse sequences of the lumbar spine were obtained without and with intravenous contrast.  Contrast: 15mL MULTIHANCE GADOBENATE DIMEGLUMINE 529 MG/ML IV SOLN  Comparison: 04/09/2013 CT.  Findings: Lumbosacral transitional anatomy is present.   The L1 level demonstrated ribs on prior CT.   For reference, the superior endplate T12 Schmorl's node is used.  Spinal cord terminates posterior the L2 vertebra.  Mild levoconvex curvature.  Paraspinal soft tissues appear within normal limits. Scattered benign vertebral body hemangiomata.  T11-T12:  Sagittal imaging.  Schmorl's node.  No stenosis.  T12-L1:  Negative.  L1-L2:  Negative.  L2-L3:  Negative.  L3-L4:  Negative.  L4-L5:  Mild facet hypertrophy and ligamentum flavum redundancy. Shallow disc bulging.  Foramina appear patent.  L5-S1:  Postoperative changes of apparent bilateral laminotomies. Decompression of the central canal and lateral recesses.  Mild perineural fibrosis around the descending S1 nerves.  Broad-based posterior disc extrusion is present. Severe right and moderate left foraminal stenosis is multifactorial.  There is complete effacement of fat around the exiting right L5 nerve.  Foraminal stenosis is multifactorial, foraminal stenosis is secondary to broad-based disc extrusion, endplate osteophytes, short pedicles and facet hypertrophy.  Rudimentary S1-S2 disc.  No stenosis.  After contrast administration, expected postsurgical enhancement.  IMPRESSION: 1.  Postoperative changes of bilateral L5 laminotomies with decompression of the central canal and lateral recesses.  Mild perineural fibrosis around the descending S1 nerves. Bilateral right greater than left foraminal stenosis associated with broad- based disc extrusion, endplate osteophytes and facet arthrosis. 2.  Mild L4-L5 disc bulging without significant stenosis.   Original Report Authenticated By: Andreas Newport, M.D.   Ir Transfor Epi L/s Sng W/fl/ct  04/14/2013   *RADIOLOGY REPORT*  CLINICAL DATA:Lumbosacral spondylosis without myelopathy. Displacement of the L5-S1 lumbar disc.  Right lower extremity radiculitis.  Right acetabular fracture.  SELECTIVE NERVE ROOT AND TRANSFORAMINAL EPIDURAL STEROID INJECTION: A transforaminal approach was performed on the right at the L5-S1  foramen.  The overlying skin was cleansed and anesthetized.  A 22 gauge spinal  needle was advanced into the foramen from a lateral oblique approach.  Injection of 2cc of Omnipaque 180 outlined the nerve root and extended into the epidural space.  No vascular or intrathecal spread is evident.  I then injected 120 mg of Depo-Medrol and 1.5 ml of 1% lidocaine. The patient tolerated the procedure without evidence for complication.  The patient was observed for 20 minutes prior to discharge in stable neurologic condition.  FLUORO TIME:  12 seconds  IMPRESSIONS:  Technically successful selective nerve root block and transforaminal epidural steroid injection on theright at the L5-S1 foramen.   Original Report Authenticated By: Marin Roberts, M.D.   Dg Hip Portable 1 View Right  04/09/2013   *RADIOLOGY REPORT*  Clinical Data: Hip pain, fall  PORTABLE RIGHT HIP - 1 VIEW  Comparison: None.  Findings: Internal and external rotation views demonstrate no fracture or dislocation of the right femur.  There is arthritis of the hip joint with narrowing, osteophyte formation, and subchondral cystic change.There is a subtle focus of cortical irregularity involving the right inferior pubic ramus.  IMPRESSION: No hip fracture.  Possible subtle nondisplaced fracture inferior pubic ramus.  Consider CT if clinically appropriate.   Original Report Authenticated By: Esperanza Heir, M.D.    Microbiology: Recent Results (from the past 240 hour(s))  MRSA PCR SCREENING     Status: None   Collection Time    04/15/13  3:38 PM      Result Value Range Status   MRSA by PCR NEGATIVE  NEGATIVE Final   Comment:            The GeneXpert MRSA Assay (FDA     approved for NASAL specimens     only), is one component of a     comprehensive MRSA colonization     surveillance program. It is not     intended to diagnose MRSA     infection nor to guide or     monitor treatment for     MRSA infections.     Labs: Basic Metabolic  Panel:  Recent Labs Lab 04/16/13 0500  CREATININE 0.61   Liver Function Tests: No results found for this basename: AST, ALT, ALKPHOS, BILITOT, PROT, ALBUMIN,  in the last 168 hours No results found for this basename: LIPASE, AMYLASE,  in the last 168 hours No results found for this basename: AMMONIA,  in the last 168 hours CBC:  Recent Labs Lab 04/13/13 0530 04/14/13 0500 04/15/13 0525 04/16/13 0500 04/17/13 0500  WBC 9.4 8.6 10.0 9.1 8.3  HGB 14.2 13.6 14.0 13.5 14.2  HCT 42.4 40.2 41.3 38.5* 43.3  MCV 101.2* 100.8* 100.2* 100.3* 101.2*  PLT 137* 133* 164 184 184   Cardiac Enzymes: No results found for this basename: CKTOTAL, CKMB, CKMBINDEX, TROPONINI,  in the last 168 hours BNP: BNP (last 3 results) No results found for this basename: PROBNP,  in the last 8760 hours CBG:  Recent Labs Lab 04/18/13 1049 04/18/13 1643 04/18/13 2135 04/19/13 0622 04/19/13 1118  GLUCAP 181* 85 179* 210* 150*       Signed:  Jamiah Recore  [facility] 04/19/2013, 2:47 PM

## 2013-04-22 NOTE — ED Provider Notes (Signed)
Medical screening examination/treatment/procedure(s) were performed by non-physician practitioner and as supervising physician I was immediately available for consultation/collaboration.   Loren Racer, MD 04/22/13 [phone]

## 2013-04-28 ENCOUNTER — Telehealth: Payer: Self-pay | Admitting: Family Medicine

## 2013-04-28 NOTE — Telephone Encounter (Signed)
Angie from [facility] stated pt does not want service today nor tomorrow for Diabetes teaching

## 2013-04-28 NOTE — Telephone Encounter (Signed)
Per Angie she stated that the patient power has been out and that he just seem like he didn't want to be bothered. She will try to go out there next week

## 2013-06-14 ENCOUNTER — Other Ambulatory Visit: Payer: Self-pay

## 2013-06-14 MED ORDER — RAMIPRIL 10 MG PO CAPS: 10.0000 mg | ORAL_CAPSULE | Freq: Every day | ORAL | Status: DC

## 2013-07-30 ENCOUNTER — Ambulatory Visit (INDEPENDENT_AMBULATORY_CARE_PROVIDER_SITE_OTHER): Payer: BC Managed Care – PPO | Admitting: Family Medicine

## 2013-07-30 ENCOUNTER — Encounter (INDEPENDENT_AMBULATORY_CARE_PROVIDER_SITE_OTHER): Payer: Self-pay

## 2013-07-30 VITALS — BP 119/75 | HR 76 | Temp 99.3°F | Ht 68.0 in | Wt 176.0 lb

## 2013-07-30 DIAGNOSIS — K089 Disorder of teeth and supporting structures, unspecified: Secondary | ICD-10-CM

## 2013-07-30 DIAGNOSIS — K0889 Other specified disorders of teeth and supporting structures: Secondary | ICD-10-CM

## 2013-07-30 MED ORDER — AMOXICILLIN 875 MG PO TABS: 875.0000 mg | ORAL_TABLET | Freq: Two times a day (BID) | ORAL | Status: DC

## 2013-07-30 NOTE — Progress Notes (Signed)
Patient ID: [name], male   DOB: 1960/09/20, 52 y.o.   MRN: [id number] SUBJECTIVE: CC: Chief Complaint  Patient presents with  . Acute Visit    tooth abscess    HPI: Goes to the [place] and has a pain doctor for his chronic pain. Has a dentist and recent filling right lower molar. Thinks he has an abscessed tooth because of severe pain in the right back tooth. And the right Jaw feels  Swollen. Patient is a DM and his CBG this am is 200. No fever , no chills, no bad breath. No trauma recollected to teeth. Told he may need a root canal.   Past Medical History  Diagnosis Date  . Hypertension   . GERD (gastroesophageal reflux disease)   . Osteoporosis   . Migraine   . Chronic lower back pain   . Vitamin D deficiency   . Hepatomegaly   . Pancreatitis   . Diabetes mellitus   . Fracture acetabulum-closed 04/09/2013   Past Surgical History  Procedure Laterality Date  . Cholecystectomy    . Hernia repair    . Laminectomy    . Back surgery     History   Social History  . Marital Status: Married    Spouse Name: N/A    Number of Children: N/A  . Years of Education: N/A   Occupational History  . Not on file.   Social History Main Topics  . Smoking status: Former Smoker    Quit date: 08/26/2007  . Smokeless tobacco: Never Used     Comment: quit smoking in 2009 sometime "  states on electronially cigartettes  . Alcohol Use: No  . Drug Use: No  . Sexual Activity: Not on file   Other Topics Concern  . Not on file   Social History Narrative  . No narrative on file   Family History  Problem Relation Age of Onset  . Cancer      breast/grandmother, prostate/grandfather   Current Outpatient Prescriptions on File Prior to Visit  Medication Sig Dispense Refill  . alum & mag hydroxide-simeth (MAALOX/MYLANTA) 200-200-20 MG/5ML suspension Take 15 mLs by mouth every 6 (six) hours as needed for indigestion.  355 mL  0  . amLODipine (NORVASC) 5 MG tablet Take 1 tablet (5 mg  total) by mouth daily.  30 tablet  0  . baclofen (LIORESAL) 10 MG tablet Take 10 mg by mouth 3 (three) times daily.      . cyanocobalamin 1000 MCG tablet Take 100 mcg by mouth daily.      . ergocalciferol (VITAMIN D2) 50000 UNITS capsule Take 50,000 Units by mouth once a week. On Saturday      . gabapentin (NEURONTIN) 400 MG capsule Take 1,200 mg by mouth 3 (three) times daily.      Marland Kitchen HYDROmorphone (DILAUDID) 2 MG tablet Take 1 tablet (2 mg total) by mouth every 6 (six) hours as needed for pain.  30 tablet  0  . insulin aspart (NOVOLOG) 100 UNIT/ML injection Inject 12 Units into the skin 3 (three) times daily before meals.  1 vial  12  . insulin glargine (LANTUS) 100 UNIT/ML injection Inject 0.6 mLs (60 Units total) into the skin daily.  10 mL  12  . morphine (MS CONTIN) 60 MG 12 hr tablet Take 60 mg by mouth 2 (two) times daily.      Marland Kitchen oxyCODONE (OXY IR/ROXICODONE) 5 MG immediate release tablet Take 10 mg by mouth every 4 (four) hours  as needed for pain.      . pantoprazole (PROTONIX) 20 MG tablet Take 20 mg by mouth daily.      . ramipril (ALTACE) 10 MG capsule Take 1 capsule (10 mg total) by mouth daily.  30 capsule  0  . salsalate (DISALCID) 750 MG tablet Take 750 mg by mouth 2 (two) times daily.      Marland Kitchen senna (SENOKOT) 8.6 MG TABS tablet Take 1 tablet (8.6 mg total) by mouth daily as needed.  10 each  0  . sertraline (ZOLOFT) 100 MG tablet Take 200 mg by mouth daily.        No current facility-administered medications on file prior to visit.   Allergies  Allergen Reactions  . Ketorolac Tromethamine     Hives (toradol)   Immunization History  Administered Date(s) Administered  . Influenza Whole 05/31/2009   Prior to Admission medications   Medication Sig Start Date End Date Taking? Authorizing Provider  alum & mag hydroxide-simeth (MAALOX/MYLANTA) 200-200-20 MG/5ML suspension Take 15 mLs by mouth every 6 (six) hours as needed for indigestion. 04/19/13  Yes Nishant Dhungel, MD   amLODipine (NORVASC) 5 MG tablet Take 1 tablet (5 mg total) by mouth daily. 04/19/13  Yes Nishant Dhungel, MD  baclofen (LIORESAL) 10 MG tablet Take 10 mg by mouth 3 (three) times daily.   Yes Historical Provider, MD  cyanocobalamin 1000 MCG tablet Take 100 mcg by mouth daily.   Yes Historical Provider, MD  ergocalciferol (VITAMIN D2) 50000 UNITS capsule Take 50,000 Units by mouth once a week. On Saturday   Yes Historical Provider, MD  gabapentin (NEURONTIN) 400 MG capsule Take 1,200 mg by mouth 3 (three) times daily.   Yes Historical Provider, MD  HYDROmorphone (DILAUDID) 2 MG tablet Take 1 tablet (2 mg total) by mouth every 6 (six) hours as needed for pain. 04/19/13  Yes Nishant Dhungel, MD  insulin aspart (NOVOLOG) 100 UNIT/ML injection Inject 12 Units into the skin 3 (three) times daily before meals. 04/19/13  Yes Nishant Dhungel, MD  insulin glargine (LANTUS) 100 UNIT/ML injection Inject 0.6 mLs (60 Units total) into the skin daily. 04/19/13  Yes Nishant Dhungel, MD  morphine (MS CONTIN) 60 MG 12 hr tablet Take 60 mg by mouth 2 (two) times daily.   Yes Historical Provider, MD  oxyCODONE (OXY IR/ROXICODONE) 5 MG immediate release tablet Take 10 mg by mouth every 4 (four) hours as needed for pain.   Yes Historical Provider, MD  pantoprazole (PROTONIX) 20 MG tablet Take 20 mg by mouth daily.   Yes Historical Provider, MD  ramipril (ALTACE) 10 MG capsule Take 1 capsule (10 mg total) by mouth daily. 06/14/13  Yes [name], MD  salsalate (DISALCID) 750 MG tablet Take 750 mg by mouth 2 (two) times daily.   Yes Historical Provider, MD  senna (SENOKOT) 8.6 MG TABS tablet Take 1 tablet (8.6 mg total) by mouth daily as needed. 04/19/13  Yes Nishant Dhungel, MD  sertraline (ZOLOFT) 100 MG tablet Take 200 mg by mouth daily.    Yes Historical Provider, MD     ROS: As above in the HPI. All other systems are stable or negative.  OBJECTIVE: APPEARANCE:  Patient in no acute distress.The patient  appeared well nourished and normally developed. Acyanotic. Waist: VITAL SIGNS:BP 119/75  Pulse 76  Temp(Src) 99.3 F (37.4 C) (Oral)  Ht 5\' 8"  (1.727 m)  Wt 176 lb (79.833 kg)  BMI 26.77 kg/m2 WM ambulates with a cane.  SKIN: warm and  Dry without overt rashes, tattoos and scars  HEAD and Neck: without JVD, Head and scalp: normal Eyes:No scleral icterus. Fundi normal, eye movements normal. Ears: Auricle normal, canal normal, Tympanic membranes normal, insufflation normal. Nose: normal Right Jaw slight puffiness. The right lower last molar has a filling and the gum looks fine. No purulence no swelling however its is exquisitely painfull to palpation. The other molar in front also is very tender to palpation. Many missing teeth. . Throat: normal Neck & thyroid: normal  CHEST & LUNGS: Chest wall: normal Lungs: Clear  CVS: Reveals the PMI to be normally located. Regular rhythm, First and Second Heart sounds are normal,  absence of murmurs, rubs or gallops. Peripheral vasculature: Radial pulses: normal Dorsal pedis pulses: normal Posterior pulses: normal  ABDOMEN:  Appearance: normal Benign, no organomegaly, no masses, no Abdominal Aortic enlargement. No Guarding , no rebound. No Bruits. Bowel sounds: normal  RECTAL: N/A GU: N/A   MUSCULOSKELETAL:  Ambulates with a  Cane.  NEUROLOGIC: oriented to time,place and person;    ASSESSMENT: Toothache - Plan: amoxicillin (AMOXIL) 875 MG tablet May be due to tooth fracture. He is DM and will need coverage in case of infection. He has pain medications to use. He already has an appointment with his Dentist for next Thursday but will contact him for an earlier appointment on Monday.  PLAN: See his dentist in the week. Use his opiod analgesics for pain control. No orders of the defined types were placed in this encounter.   Meds ordered this encounter  Medications  . amoxicillin (AMOXIL) 875 MG tablet    Sig: Take 1  tablet (875 mg total) by mouth 2 (two) times daily.    Dispense:  20 tablet    Refill:  0   There are no discontinued medications. Return if symptoms worsen or fail to improve, for follow up with his dentist.  Maryla Morrow. [place]esto Charon, M.D.

## 2013-08-12 ENCOUNTER — Ambulatory Visit (INDEPENDENT_AMBULATORY_CARE_PROVIDER_SITE_OTHER): Payer: BC Managed Care – PPO | Admitting: Family Medicine

## 2013-08-12 ENCOUNTER — Encounter: Payer: Self-pay | Admitting: Family Medicine

## 2013-08-12 VITALS — BP 120/70 | HR 68 | Temp 98.3°F | Wt 178.0 lb

## 2013-08-12 DIAGNOSIS — I10 Essential (primary) hypertension: Secondary | ICD-10-CM

## 2013-08-12 DIAGNOSIS — E1149 Type 2 diabetes mellitus with other diabetic neurological complication: Secondary | ICD-10-CM

## 2013-08-12 MED ORDER — INSULIN ASPART 100 UNIT/ML FLEXPEN: 7.0000 [IU] | PEN_INJECTOR | Freq: Three times a day (TID) | SUBCUTANEOUS | Status: DC

## 2013-08-12 MED ORDER — RAMIPRIL 10 MG PO CAPS: 10.0000 mg | ORAL_CAPSULE | Freq: Every day | ORAL | Status: DC

## 2013-08-12 MED ORDER — INSULIN GLARGINE 100 UNIT/ML SOLOSTAR PEN: 30.0000 [IU] | PEN_INJECTOR | Freq: Every day | SUBCUTANEOUS | Status: DC

## 2013-08-12 NOTE — Progress Notes (Signed)
   Subjective:    Patient ID: [name], male    DOB: [birth year], 52 y.o.   MRN: [id number]  HPI Patient has complex past medical history. History of poor followup here. He is followed at [facility]. He has past history of alcohol abuse, long-standing type 2 diabetes with neuropathy, history of pancreatitis, history of osteoporosis, history of hypogonadism, hypertension, fatty liver changes, history of alcoholic hepatitis. He had fall back in August with pelvic fracture.  Brings in copy today of recent labs the [place] significant for low vitamin D of 26 and hemoglobin A1c 9.3%. Currently takes Lantus 30 units once daily and NovoLog 7 units with meals. Eye exam in November through the [place]. No recent hypoglycemia. Fasting blood sugars vary considerably usually between 150 and 200 range. Blood pressure stable. No dizziness. No chest pains. He has some chronic mild leg edema.  Has already had flu vaccine.  Past Medical History  Diagnosis Date  . Hypertension   . GERD (gastroesophageal reflux disease)   . Osteoporosis   . Migraine   . Chronic lower back pain   . Vitamin D deficiency   . Hepatomegaly   . Pancreatitis   . Diabetes mellitus   . Fracture acetabulum-closed 04/09/2013   Past Surgical History  Procedure Laterality Date  . Cholecystectomy    . Hernia repair    . Laminectomy    . Back surgery      reports that he quit smoking about 5 years ago. He has never used smokeless tobacco. He reports that he does not drink alcohol or use illicit drugs. family history includes Cancer in an other family member. Allergies  Allergen Reactions  . Ketorolac Tromethamine     Hives (toradol)      Review of Systems  Constitutional: Negative for fever, chills and fatigue.  HENT: Negative for trouble swallowing.   Eyes: Negative for visual disturbance.  Respiratory: Negative for cough, chest tightness and shortness of breath.   Cardiovascular: Positive for leg swelling. Negative  for chest pain and palpitations.  Gastrointestinal: Negative for nausea and vomiting.  Neurological: Negative for dizziness, syncope, weakness, light-headedness and headaches.       Objective:   Physical Exam  Constitutional: He appears well-developed and well-nourished.  Neck: Neck supple. No thyromegaly present.  Cardiovascular: Normal rate and regular rhythm.   Pulmonary/Chest: Effort normal and breath sounds normal. No respiratory distress. He has no wheezes. He has no rales.  Musculoskeletal: He exhibits edema.  Skin:  Feet reveal no skin lesions. Good distal foot pulses. Good capillary refill. No calluses. Slightly impaired sensation with monofilament testing           Assessment & Plan:  #1 type 2 diabetes with poor control and complications including peripheral neuropathy. Titration regimen for Lantus and also more frequent monitoring of postprandial blood sugars. Titrate Lantus until fasting blood sugars consistently less than 130. Consider further titration of NovoLog if two-hour postprandials not consistently less than 180. #2 hypertension well controlled #3 chronic pain related to severe peripheral neuropathy. Followed by pain management.

## 2013-08-12 NOTE — Patient Instructions (Signed)
Goal blood sugars are less than 130 fasting and less than 180 two hours after eating Titrate Lantus insulin up 2 units every 3 days until fasting blood sugars consistently around 130 or less Check occasional two-hour blood sugars after meals. If blood sugars consistently greater than 180 two hours after eating consider titrating up NovoLog insulin one to 2 more units

## 2013-08-24 ENCOUNTER — Telehealth: Payer: Self-pay | Admitting: Family Medicine

## 2013-08-24 MED ORDER — INSULIN ASPART 100 UNIT/ML FLEXPEN: 7.0000 [IU] | PEN_INJECTOR | Freq: Three times a day (TID) | SUBCUTANEOUS | Status: DC

## 2013-08-24 MED ORDER — INSULIN GLARGINE 100 UNIT/ML SOLOSTAR PEN: 30.0000 [IU] | PEN_INJECTOR | Freq: Every day | SUBCUTANEOUS | Status: DC

## 2013-08-24 NOTE — Telephone Encounter (Signed)
RX is faxed to that number

## 2013-08-24 NOTE — Telephone Encounter (Signed)
Okay to provide

## 2013-08-24 NOTE — Telephone Encounter (Signed)
Patient called stating they wanted to change pharmacy to home delivery because its cheaper and they get  90 day prescription/ 3 month supply  for Insulin Glargine (LANTUS SOLOSTAR and Novolog Stated prescription needs to be faxed to [phone] Please advise

## 2013-08-26 ENCOUNTER — Telehealth: Payer: Self-pay | Admitting: Family Medicine

## 2013-08-26 NOTE — Telephone Encounter (Signed)
Called to inform patient that RX has  Been faxed, but vm has not been setup yet

## 2015-01-10 ENCOUNTER — Encounter (HOSPITAL_COMMUNITY): Payer: Self-pay | Admitting: Emergency Medicine

## 2015-01-10 ENCOUNTER — Inpatient Hospital Stay (HOSPITAL_COMMUNITY)
Admission: EM | Admit: 2015-01-10 | Discharge: 2015-01-25 | DRG: 207 | Disposition: A | Discharge status: Home Health | Payer: Medicare Other | Attending: Internal Medicine | Admitting: Internal Medicine

## 2015-01-10 ENCOUNTER — Emergency Department (HOSPITAL_COMMUNITY): Payer: Medicare Other

## 2015-01-10 DIAGNOSIS — R197 Diarrhea, unspecified: Secondary | ICD-10-CM | POA: Diagnosis present

## 2015-01-10 DIAGNOSIS — K219 Gastro-esophageal reflux disease without esophagitis: Secondary | ICD-10-CM | POA: Diagnosis present

## 2015-01-10 DIAGNOSIS — G8929 Other chronic pain: Secondary | ICD-10-CM | POA: Diagnosis present

## 2015-01-10 DIAGNOSIS — D696 Thrombocytopenia, unspecified: Secondary | ICD-10-CM | POA: Diagnosis present

## 2015-01-10 DIAGNOSIS — Z79891 Long term (current) use of opiate analgesic: Secondary | ICD-10-CM

## 2015-01-10 DIAGNOSIS — Z9289 Personal history of other medical treatment: Secondary | ICD-10-CM

## 2015-01-10 DIAGNOSIS — M545 Low back pain: Secondary | ICD-10-CM | POA: Diagnosis present

## 2015-01-10 DIAGNOSIS — Z87891 Personal history of nicotine dependence: Secondary | ICD-10-CM

## 2015-01-10 DIAGNOSIS — R0902 Hypoxemia: Secondary | ICD-10-CM

## 2015-01-10 DIAGNOSIS — N179 Acute kidney failure, unspecified: Secondary | ICD-10-CM | POA: Diagnosis present

## 2015-01-10 DIAGNOSIS — Z794 Long term (current) use of insulin: Secondary | ICD-10-CM

## 2015-01-10 DIAGNOSIS — R0602 Shortness of breath: Secondary | ICD-10-CM | POA: Diagnosis not present

## 2015-01-10 DIAGNOSIS — Y95 Nosocomial condition: Secondary | ICD-10-CM | POA: Diagnosis present

## 2015-01-10 DIAGNOSIS — E872 Acidosis: Secondary | ICD-10-CM | POA: Diagnosis not present

## 2015-01-10 DIAGNOSIS — E871 Hypo-osmolality and hyponatremia: Secondary | ICD-10-CM | POA: Diagnosis present

## 2015-01-10 DIAGNOSIS — R000 Tachycardia, unspecified: Secondary | ICD-10-CM | POA: Diagnosis not present

## 2015-01-10 DIAGNOSIS — E1165 Type 2 diabetes mellitus with hyperglycemia: Secondary | ICD-10-CM | POA: Diagnosis present

## 2015-01-10 DIAGNOSIS — G7281 Critical illness myopathy: Secondary | ICD-10-CM | POA: Diagnosis not present

## 2015-01-10 DIAGNOSIS — R509 Fever, unspecified: Secondary | ICD-10-CM

## 2015-01-10 DIAGNOSIS — L8990 Pressure ulcer of unspecified site, unspecified stage: Secondary | ICD-10-CM | POA: Insufficient documentation

## 2015-01-10 DIAGNOSIS — R160 Hepatomegaly, not elsewhere classified: Secondary | ICD-10-CM | POA: Diagnosis present

## 2015-01-10 DIAGNOSIS — Z978 Presence of other specified devices: Secondary | ICD-10-CM

## 2015-01-10 DIAGNOSIS — T17990A Other foreign object in respiratory tract, part unspecified in causing asphyxiation, initial encounter: Secondary | ICD-10-CM | POA: Diagnosis not present

## 2015-01-10 DIAGNOSIS — J9690 Respiratory failure, unspecified, unspecified whether with hypoxia or hypercapnia: Secondary | ICD-10-CM

## 2015-01-10 DIAGNOSIS — J80 Acute respiratory distress syndrome: Secondary | ICD-10-CM

## 2015-01-10 DIAGNOSIS — E876 Hypokalemia: Secondary | ICD-10-CM | POA: Diagnosis present

## 2015-01-10 DIAGNOSIS — R748 Abnormal levels of other serum enzymes: Secondary | ICD-10-CM | POA: Diagnosis present

## 2015-01-10 DIAGNOSIS — R079 Chest pain, unspecified: Secondary | ICD-10-CM | POA: Diagnosis not present

## 2015-01-10 DIAGNOSIS — A481 Legionnaires' disease: Secondary | ICD-10-CM | POA: Diagnosis not present

## 2015-01-10 DIAGNOSIS — E870 Hyperosmolality and hypernatremia: Secondary | ICD-10-CM | POA: Diagnosis not present

## 2015-01-10 DIAGNOSIS — R05 Cough: Secondary | ICD-10-CM | POA: Diagnosis not present

## 2015-01-10 DIAGNOSIS — Z79899 Other long term (current) drug therapy: Secondary | ICD-10-CM

## 2015-01-10 DIAGNOSIS — J9601 Acute respiratory failure with hypoxia: Secondary | ICD-10-CM

## 2015-01-10 DIAGNOSIS — I10 Essential (primary) hypertension: Secondary | ICD-10-CM | POA: Diagnosis present

## 2015-01-10 DIAGNOSIS — R410 Disorientation, unspecified: Secondary | ICD-10-CM | POA: Diagnosis not present

## 2015-01-10 DIAGNOSIS — M810 Age-related osteoporosis without current pathological fracture: Secondary | ICD-10-CM | POA: Diagnosis present

## 2015-01-10 DIAGNOSIS — R17 Unspecified jaundice: Secondary | ICD-10-CM | POA: Insufficient documentation

## 2015-01-10 DIAGNOSIS — I2510 Atherosclerotic heart disease of native coronary artery without angina pectoris: Secondary | ICD-10-CM | POA: Diagnosis present

## 2015-01-10 DIAGNOSIS — Z4659 Encounter for fitting and adjustment of other gastrointestinal appliance and device: Secondary | ICD-10-CM

## 2015-01-10 DIAGNOSIS — J9600 Acute respiratory failure, unspecified whether with hypoxia or hypercapnia: Secondary | ICD-10-CM

## 2015-01-10 DIAGNOSIS — J189 Pneumonia, unspecified organism: Secondary | ICD-10-CM

## 2015-01-10 LAB — I-STAT ARTERIAL BLOOD GAS, ED
BICARBONATE: 27.1 meq/L — AB (ref 20.0–24.0)
O2 SAT: 85 %
PH ART: 7.336 — AB (ref 7.350–7.450)
Patient temperature: 98.7
TCO2: 29 mmol/L (ref 0–100)
pCO2 arterial: 50.8 mmHg — ABNORMAL HIGH (ref 35.0–45.0)
pO2, Arterial: 54 mmHg — ABNORMAL LOW (ref 80.0–100.0)

## 2015-01-10 LAB — I-STAT TROPONIN, ED: TROPONIN I, POC: 0 ng/mL (ref 0.00–0.08)

## 2015-01-10 LAB — COMPREHENSIVE METABOLIC PANEL
ALBUMIN: 3 g/dL — AB (ref 3.5–5.0)
ALK PHOS: 214 U/L — AB (ref 38–126)
ALT: 62 U/L (ref 17–63)
AST: 315 U/L — ABNORMAL HIGH (ref 15–41)
Anion gap: 21 — ABNORMAL HIGH (ref 5–15)
BILIRUBIN TOTAL: 3.2 mg/dL — AB (ref 0.3–1.2)
BUN: 25 mg/dL — AB (ref 6–20)
CO2: 24 mmol/L (ref 22–32)
CREATININE: 1.31 mg/dL — AB (ref 0.61–1.24)
Calcium: 8.2 mg/dL — ABNORMAL LOW (ref 8.9–10.3)
Chloride: 79 mmol/L — ABNORMAL LOW (ref 101–111)
GFR calc Af Amer: >60 mL/min (ref >60)
GFR calc non Af Amer: >60 mL/min (ref >60)
GLUCOSE: 338 mg/dL — AB (ref 65–99)
POTASSIUM: 2.6 mmol/L — AB (ref 3.5–5.1)
Sodium: 124 mmol/L — ABNORMAL LOW (ref 135–145)
TOTAL PROTEIN: 7.2 g/dL (ref 6.5–8.1)

## 2015-01-10 LAB — CBC WITH DIFFERENTIAL/PLATELET
BASOS ABS: 0 10*3/uL (ref 0.0–0.1)
BASOS PCT: 0 % (ref 0–1)
Eosinophils Absolute: 0 10*3/uL (ref 0.0–0.7)
Eosinophils Relative: 0 % (ref 0–5)
HCT: 45.9 % (ref 39.0–52.0)
Hemoglobin: 16.1 g/dL (ref 13.0–17.0)
Lymphocytes Relative: 3 % — ABNORMAL LOW (ref 12–46)
Lymphs Abs: 0.6 10*3/uL — ABNORMAL LOW (ref 0.7–4.0)
MCH: 32.7 pg (ref 26.0–34.0)
MCHC: 35.1 g/dL (ref 30.0–36.0)
MCV: 93.3 fL (ref 78.0–100.0)
Monocytes Absolute: 0.9 10*3/uL (ref 0.1–1.0)
Monocytes Relative: 5 % (ref 3–12)
NEUTROS ABS: 16.4 10*3/uL — AB (ref 1.7–7.7)
NEUTROS PCT: 92 % — AB (ref 43–77)
PLATELETS: 160 10*3/uL (ref 150–400)
RBC: 4.92 MIL/uL (ref 4.22–5.81)
RDW: 13.6 % (ref 11.5–15.5)
WBC: 17.9 10*3/uL — ABNORMAL HIGH (ref 4.0–10.5)

## 2015-01-10 LAB — I-STAT CG4 LACTIC ACID, ED: LACTIC ACID, VENOUS: 5.45 mmol/L — AB (ref 0.5–2.0)

## 2015-01-10 LAB — CBG MONITORING, ED: GLUCOSE-CAPILLARY: 349 mg/dL — AB (ref 65–99)

## 2015-01-10 LAB — BRAIN NATRIURETIC PEPTIDE: B NATRIURETIC PEPTIDE 5: 92.4 pg/mL (ref 0.0–100.0)

## 2015-01-10 MED ORDER — SODIUM CHLORIDE 0.9 % IV BOLUS (SEPSIS)
1000.0000 mL | Freq: Once | INTRAVENOUS | Status: AC
  Administered 2015-01-10: 1000 mL via INTRAVENOUS

## 2015-01-10 MED ORDER — SODIUM CHLORIDE 0.9 % IV BOLUS (SEPSIS): 500.0000 mL | INTRAVENOUS | Status: DC

## 2015-01-10 MED ORDER — DEXTROSE 5 % IV SOLN
1.0000 g | Freq: Once | INTRAVENOUS | Status: AC
  Administered 2015-01-10: 1 g via INTRAVENOUS
  Filled 2015-01-10: qty 10

## 2015-01-10 MED ORDER — SODIUM CHLORIDE 0.9 % IV BOLUS (SEPSIS): 1000.0000 mL | INTRAVENOUS | Status: DC

## 2015-01-10 MED ORDER — AZITHROMYCIN 500 MG IV SOLR
500.0000 mg | Freq: Once | INTRAVENOUS | Status: DC
  Administered 2015-01-10: 500 mg via INTRAVENOUS
  Filled 2015-01-10: qty 500

## 2015-01-10 MED ORDER — SODIUM CHLORIDE 0.9 % IV BOLUS (SEPSIS)
1400.0000 mL | Freq: Once | INTRAVENOUS | Status: AC
  Administered 2015-01-10: 1400 mL via INTRAVENOUS

## 2015-01-10 NOTE — Progress Notes (Signed)
RN aware of ABG results

## 2015-01-10 NOTE — ED Notes (Signed)
Pt. reports fever with productive cough , generalized weakness/fatigue onset this week with chronic low back pain . Presents to ER with diaphoresis , pale and lethargic , pt. stated he has been taking multiple Morphine and Oxycodone for his back pain today .

## 2015-01-10 NOTE — ED Notes (Addendum)
CG-4 of 5.45 reported to [employer] [facility]

## 2015-01-10 NOTE — ED Notes (Signed)
Abx started prior to blood cultures being ordered/drawn.

## 2015-01-10 NOTE — ED Provider Notes (Signed)
  Face-to-face evaluation   History: He is been ill for several days with fever, cough, sputum production and general weakness. His wife was diagnosed with pneumonia yesterday.  Physical exam: Alert, cooperative, somewhat tachypneic. Lungs with diffuse rhonchi. He is mentating well.  Medical screening examination/treatment/procedure(s) were conducted as a shared visit with non-physician practitioner(s) and myself.  I personally evaluated the patient during the encounter  Mancel BaleElliott Zakaiya Lares, MD 01/12/15 [phone]

## 2015-01-10 NOTE — ED Provider Notes (Signed)
CSN: [id number]     Arrival date & time 01/10/15  2048 History   First MD Initiated Contact with Patient 01/10/15 2118     Chief Complaint  Patient presents with  . Fever  . Weakness     (Consider location/radiation/quality/duration/timing/severity/associated sxs/prior Treatment) HPI [name] is a 54 year old male with past medical history of hypertension, diabetes who presents the ER complaining of subjective fever, productive cough, generalized weakness and fatigue. Patient reports these symptoms began several days ago. Patient's wife in the room reports fevers of up to 101-102. Patient reports also productive cough over the past several days. He reports exposure to his wife who recently had pneumonia and was hospitalized. Patient reports dyspnea on exertion over the past several days. Patient reports chest pain, however states it is soreness from a sternum repair surgery. He states he typically has this pain. Patient reports generalized malaise. Patient denies syncope, headache, blurred vision, dizziness, weakness, nausea, vomiting, abdominal pain  Past Medical History  Diagnosis Date  . Hypertension   . GERD (gastroesophageal reflux disease)   . Osteoporosis   . Migraine   . Chronic lower back pain   . Vitamin D deficiency   . Hepatomegaly   . Pancreatitis   . Diabetes mellitus   . Fracture acetabulum-closed 04/09/2013   Past Surgical History  Procedure Laterality Date  . Cholecystectomy    . Hernia repair    . Laminectomy    . Back surgery     Family History  Problem Relation Age of Onset  . Cancer      breast/grandmother, prostate/grandfather   History  Substance Use Topics  . Smoking status: Former Smoker    Quit date: 08/26/2007  . Smokeless tobacco: Never Used     Comment: quit smoking in 2009 sometime "  states on electronially cigartettes  . Alcohol Use: No    Review of Systems  Constitutional: Positive for fever and fatigue.  HENT: Negative for  trouble swallowing.   Eyes: Negative for visual disturbance.  Respiratory: Positive for cough and shortness of breath.   Cardiovascular: Negative for chest pain.  Gastrointestinal: Negative for nausea, vomiting and abdominal pain.  Genitourinary: Negative for dysuria.  Musculoskeletal: Negative for neck pain.  Skin: Negative for rash.  Neurological: Negative for dizziness, weakness and numbness.  Psychiatric/Behavioral: Negative.       Allergies  Ketorolac tromethamine  Home Medications   Prior to Admission medications   Medication Sig Start Date End Date Taking? Authorizing Provider  albuterol (PROVENTIL HFA;VENTOLIN HFA) 108 (90 BASE) MCG/ACT inhaler Inhale 1 puff into the lungs every 6 (six) hours as needed for wheezing or shortness of breath.   Yes Historical Provider, MD  baclofen (LIORESAL) 10 MG tablet Take 10 mg by mouth 3 (three) times daily.   Yes Historical Provider, MD  Calcium Carb-Cholecalciferol (CALCIUM-VITAMIN D) 600-400 MG-UNIT TABS Take 1 tablet by mouth daily.   Yes Historical Provider, MD  cyanocobalamin 1000 MCG tablet Take 100 mcg by mouth daily.   Yes Historical Provider, MD  ergocalciferol (VITAMIN D2) 50000 UNITS capsule Take 50,000 Units by mouth once a week. On Saturday   Yes Historical Provider, MD  fluticasone (VERAMYST) 27.5 MCG/SPRAY nasal spray Place 2 sprays into the nose daily.   Yes Historical Provider, MD  gabapentin (NEURONTIN) 400 MG capsule Take 1,200 mg by mouth 3 (three) times daily.   Yes Historical Provider, MD  hydrochlorothiazide (MICROZIDE) 12.5 MG capsule Take 12.5 mg by mouth daily.   Yes Historical  Provider, MD  insulin aspart (NOVOLOG FLEXPEN) 100 UNIT/ML SOPN FlexPen Inject 7 Units into the skin 3 (three) times daily with meals. 08/24/13  Yes Kristian CoveyBruce W Burchette, MD  Insulin Glargine (LANTUS SOLOSTAR) 100 UNIT/ML SOPN Inject 30 Units into the skin daily. 08/24/13  Yes Kristian CoveyBruce W Burchette, MD  MORPHINE SULFATE PO Take 1 tablet by mouth 3  (three) times daily. 15mg  tablet. Take one tablet tid a day per patient   Yes Historical Provider, MD  oxyCODONE (OXY IR/ROXICODONE) 5 MG immediate release tablet Take 10 mg by mouth every 4 (four) hours as needed for pain.   Yes Historical Provider, MD  pantoprazole (PROTONIX) 20 MG tablet Take 20 mg by mouth daily.   Yes Historical Provider, MD  ramipril (ALTACE) 5 MG capsule Take 5 mg by mouth daily.   Yes Historical Provider, MD  sertraline (ZOLOFT) 100 MG tablet Take 100 mg by mouth daily.    Yes Historical Provider, MD  ramipril (ALTACE) 10 MG capsule Take 1 capsule (10 mg total) by mouth daily. Patient not taking: Reported on 01/10/2015 08/12/13   Kristian CoveyBruce W Burchette, MD   BP 121/81 mmHg  Pulse 93  Temp(Src) 96.3 F (35.7 C) (Rectal)  Resp 23  Ht 5\' 8"  (1.727 m)  Wt 170 lb (77.111 kg)  BMI 25.85 kg/m2  SpO2 95% Physical Exam  Constitutional: He is oriented to person, place, and time. He appears well-developed and well-nourished.  Pale, frail-appearing male in moderate respiratory distress.  HENT:  Head: Normocephalic and atraumatic.  Mouth/Throat: Oropharynx is clear and moist. No oropharyngeal exudate.  Eyes: Right eye exhibits no discharge. Left eye exhibits no discharge. Scleral icterus is present.  Neck: Normal range of motion and full passive range of motion without pain. Neck supple. No spinous process tenderness and no muscular tenderness present. No rigidity. No edema, no erythema and normal range of motion present. No Brudzinski's sign and no Kernig's sign noted.  Cardiovascular: Regular rhythm, S1 normal, S2 normal and normal heart sounds.  Tachycardia present.   No murmur heard. Tachycardia at 101.  Pulmonary/Chest: No accessory muscle usage. Tachypnea noted. He is in respiratory distress. He has rales in the right upper field, the right middle field, the right lower field, the left upper field, the left middle field and the left lower field.  Abdominal: Soft. There is no  tenderness.  Musculoskeletal: Normal range of motion. He exhibits no edema or tenderness.  Neurological: He is alert and oriented to person, place, and time. He has normal strength. No cranial nerve deficit or sensory deficit. Coordination normal. GCS eye subscore is 4. GCS verbal subscore is 5. GCS motor subscore is 6.  Skin: Skin is warm and dry. No rash noted.  Psychiatric: He has a normal mood and affect.  Nursing note and vitals reviewed.   ED Course  Procedures (including critical care time) Labs Review Labs Reviewed  CBC WITH DIFFERENTIAL/PLATELET - Abnormal; Notable for the following:    WBC 17.9 (*)    Neutrophils Relative % 92 (*)    Neutro Abs 16.4 (*)    Lymphocytes Relative 3 (*)    Lymphs Abs 0.6 (*)    All other components within normal limits  COMPREHENSIVE METABOLIC PANEL - Abnormal; Notable for the following:    Sodium 124 (*)    Potassium 2.6 (*)    Chloride 79 (*)    Glucose, Bld 338 (*)    BUN 25 (*)    Creatinine, Ser 1.31 (*)  Calcium 8.2 (*)    Albumin 3.0 (*)    AST 315 (*)    Alkaline Phosphatase 214 (*)    Total Bilirubin 3.2 (*)    Anion gap 21 (*)    All other components within normal limits  I-STAT CG4 LACTIC ACID, ED - Abnormal; Notable for the following:    Lactic Acid, Venous 5.45 (*)    All other components within normal limits  I-STAT ARTERIAL BLOOD GAS, ED - Abnormal; Notable for the following:    pH, Arterial 7.336 (*)    pCO2 arterial 50.8 (*)    pO2, Arterial 54.0 (*)    Bicarbonate 27.1 (*)    All other components within normal limits  CBG MONITORING, ED - Abnormal; Notable for the following:    Glucose-Capillary 349 (*)    All other components within normal limits  CULTURE, BLOOD (ROUTINE X 2)  CULTURE, BLOOD (ROUTINE X 2)  CULTURE, EXPECTORATED SPUTUM-ASSESSMENT  BRAIN NATRIURETIC PEPTIDE  LACTIC ACID, PLASMA  LACTIC ACID, PLASMA  LACTIC ACID, PLASMA  CBC  BASIC METABOLIC PANEL  MAGNESIUM  PHOSPHORUS  LEGIONELLA  ANTIGEN, URINE  STREP PNEUMONIAE URINARY ANTIGEN  CALCIUM, IONIZED  BILIRUBIN, TOTAL  I-STAT TROPOININ, ED  I-STAT CG4 LACTIC ACID, ED    Imaging Review Dg Chest Port 1 View  01/10/2015   CLINICAL DATA:  Acute onset of left-sided chest pain, fever and hypoxia. Shortness of breath and productive cough. Initial encounter.  EXAM: PORTABLE CHEST - 1 VIEW  COMPARISON:  Chest radiograph performed 07/16/2010  FINDINGS: The lungs are well-aerated. Diffuse bilateral airspace opacification may reflect pulmonary edema, diffuse pneumonia or ARDS. There is no evidence of pleural effusion or pneumothorax.  The cardiomediastinal silhouette is borderline normal in size. No acute osseous abnormalities are seen. Postoperative change is noted overlying the mid thoracic spine.  IMPRESSION: Diffuse bilateral airspace opacification may reflect pulmonary edema, diffuse pneumonia or ARDS.   Electronically Signed   By: Roanna Raider M.D.   On: 01/10/2015 23:21     EKG Interpretation   Date/Time:  Wednesday Jan 10 2015 21:11:48 EDT Ventricular Rate:  96 PR Interval:  149 QRS Duration: 100 QT Interval:  389 QTC Calculation: 492 R Axis:   36 Text Interpretation:  Sinus rhythm Biatrial enlargement ST elev, probable  normal early repol pattern Borderline prolonged QT interval Since last  tracing QT has lengthened Confirmed by Effie Shy  MD, ELLIOTT [phone]) on  01/10/2015 11:24:21 PM      CRITICAL CARE Performed by: Monte Fantasia   Total critical care time: 95  Critical care time was exclusive of separately billable procedures and treating other patients.  Critical care was necessary to treat or prevent imminent or life-threatening deterioration.  Critical care was time spent personally by me on the following activities: development of treatment plan with patient and/or surrogate as well as nursing, discussions with consultants, evaluation of patient's response to treatment, examination of patient, obtaining  history from patient or surrogate, ordering and performing treatments and interventions, ordering and review of laboratory studies, ordering and review of radiographic studies, pulse oximetry and re-evaluation of patient's condition.   MDM   Final diagnoses:  Hypoxia  Fever    Patient here with subjective fevers, cough, shortness of breath on exertion times several days. On exam patient is mildly diaphoretic and cyanotic initially. O2 saturation in the 50s to 60s on room air. Patient placed on nonrebreather mask, O2 saturation increased to mid 90s. Patient had a lactic acid of  5.45, respiratory acidosis on ABG, leukocytosis of 17.9, elevated BUN and creatinine. Chest x-ray shows diffuse bilateral airspace opacification reflecting pulmonary edema, diffuse pneumonia versus ARDS. Based on patient's initial O2 requirement and most likely community-acquired pneumonia, will admit patient to critical care. Patient placed on IV azithromycin and Rocephin, sepsis workup/protocol initiated. While patient in the ER, O2 saturations dropped into the mid 80s to upper 80s while still on nonrebreather at 15 L/m. Patient placed on BiPAP as tolerated well. Oxygen saturations back into the mid 90s on BiPAP. Patient to be admitted to stepdown.  BP 121/81 mmHg  Pulse 93  Temp(Src) 96.3 F (35.7 C) (Rectal)  Resp 23  Ht 5\' 8"  (1.727 m)  Wt 170 lb (77.111 kg)  BMI 25.85 kg/m2  SpO2 95%  Signed,  Ladona MowJoe Cathye Kreiter, PA-C 1:21 AM  Patient seen and discussed with Dr. Gray BernhardtElliot Wentz, MD  Ladona MowJoe Nino Amano, PA-C 01/11/15 0005  Ladona MowJoe Dinia Joynt, PA-C 01/11/15 0121  Mancel BaleElliott Wentz, MD 01/12/15 [phone]

## 2015-01-10 NOTE — ED Notes (Signed)
Admitting MD at bedside.

## 2015-01-11 ENCOUNTER — Inpatient Hospital Stay (HOSPITAL_COMMUNITY): Payer: Medicare Other

## 2015-01-11 DIAGNOSIS — E871 Hypo-osmolality and hyponatremia: Secondary | ICD-10-CM | POA: Diagnosis not present

## 2015-01-11 DIAGNOSIS — N179 Acute kidney failure, unspecified: Secondary | ICD-10-CM | POA: Diagnosis not present

## 2015-01-11 DIAGNOSIS — R918 Other nonspecific abnormal finding of lung field: Secondary | ICD-10-CM | POA: Diagnosis not present

## 2015-01-11 DIAGNOSIS — Z79899 Other long term (current) drug therapy: Secondary | ICD-10-CM | POA: Diagnosis not present

## 2015-01-11 DIAGNOSIS — J80 Acute respiratory distress syndrome: Secondary | ICD-10-CM

## 2015-01-11 DIAGNOSIS — Z4682 Encounter for fitting and adjustment of non-vascular catheter: Secondary | ICD-10-CM | POA: Diagnosis not present

## 2015-01-11 DIAGNOSIS — R410 Disorientation, unspecified: Secondary | ICD-10-CM | POA: Diagnosis not present

## 2015-01-11 DIAGNOSIS — E870 Hyperosmolality and hypernatremia: Secondary | ICD-10-CM | POA: Diagnosis not present

## 2015-01-11 DIAGNOSIS — Z87891 Personal history of nicotine dependence: Secondary | ICD-10-CM | POA: Diagnosis not present

## 2015-01-11 DIAGNOSIS — N2889 Other specified disorders of kidney and ureter: Secondary | ICD-10-CM | POA: Diagnosis not present

## 2015-01-11 DIAGNOSIS — R17 Unspecified jaundice: Secondary | ICD-10-CM | POA: Diagnosis not present

## 2015-01-11 DIAGNOSIS — M545 Low back pain: Secondary | ICD-10-CM | POA: Diagnosis present

## 2015-01-11 DIAGNOSIS — R0902 Hypoxemia: Secondary | ICD-10-CM | POA: Insufficient documentation

## 2015-01-11 DIAGNOSIS — Z794 Long term (current) use of insulin: Secondary | ICD-10-CM | POA: Diagnosis not present

## 2015-01-11 DIAGNOSIS — E876 Hypokalemia: Secondary | ICD-10-CM | POA: Diagnosis present

## 2015-01-11 DIAGNOSIS — Z9889 Other specified postprocedural states: Secondary | ICD-10-CM | POA: Diagnosis not present

## 2015-01-11 DIAGNOSIS — J9690 Respiratory failure, unspecified, unspecified whether with hypoxia or hypercapnia: Secondary | ICD-10-CM | POA: Diagnosis not present

## 2015-01-11 DIAGNOSIS — A481 Legionnaires' disease: Secondary | ICD-10-CM | POA: Diagnosis not present

## 2015-01-11 DIAGNOSIS — E872 Acidosis, unspecified: Secondary | ICD-10-CM | POA: Insufficient documentation

## 2015-01-11 DIAGNOSIS — Z79891 Long term (current) use of opiate analgesic: Secondary | ICD-10-CM | POA: Diagnosis not present

## 2015-01-11 DIAGNOSIS — Y95 Nosocomial condition: Secondary | ICD-10-CM | POA: Diagnosis present

## 2015-01-11 DIAGNOSIS — K219 Gastro-esophageal reflux disease without esophagitis: Secondary | ICD-10-CM | POA: Diagnosis present

## 2015-01-11 DIAGNOSIS — J9601 Acute respiratory failure with hypoxia: Secondary | ICD-10-CM | POA: Diagnosis not present

## 2015-01-11 DIAGNOSIS — G7281 Critical illness myopathy: Secondary | ICD-10-CM | POA: Diagnosis not present

## 2015-01-11 DIAGNOSIS — R748 Abnormal levels of other serum enzymes: Secondary | ICD-10-CM | POA: Diagnosis present

## 2015-01-11 DIAGNOSIS — T17990A Other foreign object in respiratory tract, part unspecified in causing asphyxiation, initial encounter: Secondary | ICD-10-CM | POA: Diagnosis not present

## 2015-01-11 DIAGNOSIS — R160 Hepatomegaly, not elsewhere classified: Secondary | ICD-10-CM | POA: Diagnosis present

## 2015-01-11 DIAGNOSIS — D696 Thrombocytopenia, unspecified: Secondary | ICD-10-CM | POA: Diagnosis present

## 2015-01-11 DIAGNOSIS — I10 Essential (primary) hypertension: Secondary | ICD-10-CM | POA: Diagnosis present

## 2015-01-11 DIAGNOSIS — J9600 Acute respiratory failure, unspecified whether with hypoxia or hypercapnia: Secondary | ICD-10-CM | POA: Diagnosis not present

## 2015-01-11 DIAGNOSIS — G9340 Encephalopathy, unspecified: Secondary | ICD-10-CM | POA: Diagnosis not present

## 2015-01-11 DIAGNOSIS — M810 Age-related osteoporosis without current pathological fracture: Secondary | ICD-10-CM | POA: Diagnosis present

## 2015-01-11 DIAGNOSIS — G8929 Other chronic pain: Secondary | ICD-10-CM | POA: Diagnosis present

## 2015-01-11 DIAGNOSIS — J984 Other disorders of lung: Secondary | ICD-10-CM | POA: Diagnosis not present

## 2015-01-11 DIAGNOSIS — R509 Fever, unspecified: Secondary | ICD-10-CM | POA: Insufficient documentation

## 2015-01-11 DIAGNOSIS — I2510 Atherosclerotic heart disease of native coronary artery without angina pectoris: Secondary | ICD-10-CM | POA: Diagnosis present

## 2015-01-11 DIAGNOSIS — J189 Pneumonia, unspecified organism: Secondary | ICD-10-CM

## 2015-01-11 DIAGNOSIS — R197 Diarrhea, unspecified: Secondary | ICD-10-CM | POA: Diagnosis present

## 2015-01-11 DIAGNOSIS — E1165 Type 2 diabetes mellitus with hyperglycemia: Secondary | ICD-10-CM | POA: Diagnosis present

## 2015-01-11 DIAGNOSIS — E118 Type 2 diabetes mellitus with unspecified complications: Secondary | ICD-10-CM

## 2015-01-11 LAB — BASIC METABOLIC PANEL
Anion gap: 12 (ref 5–15)
Anion gap: 9 (ref 5–15)
BUN: 23 mg/dL — ABNORMAL HIGH (ref 6–20)
BUN: 24 mg/dL — ABNORMAL HIGH (ref 6–20)
CALCIUM: 7.3 mg/dL — AB (ref 8.9–10.3)
CO2: 26 mmol/L (ref 22–32)
CO2: 24 mmol/L (ref 22–32)
CREATININE: 1.21 mg/dL (ref 0.61–1.24)
Calcium: 7.1 mg/dL — ABNORMAL LOW (ref 8.9–10.3)
Chloride: 86 mmol/L — ABNORMAL LOW (ref 101–111)
Chloride: 100 mmol/L — ABNORMAL LOW (ref 101–111)
Creatinine, Ser: 0.89 mg/dL (ref 0.61–1.24)
GFR calc non Af Amer: >60 mL/min (ref >60)
GFR calc non Af Amer: >60 mL/min (ref >60)
GLUCOSE: 157 mg/dL — AB (ref 65–99)
Glucose, Bld: 365 mg/dL — ABNORMAL HIGH (ref 65–99)
Potassium: 2.6 mmol/L — CL (ref 3.5–5.1)
Potassium: 2.8 mmol/L — ABNORMAL LOW (ref 3.5–5.1)
Sodium: 124 mmol/L — ABNORMAL LOW (ref 135–145)
Sodium: 133 mmol/L — ABNORMAL LOW (ref 135–145)

## 2015-01-11 LAB — CBC
HCT: 40.1 % (ref 39.0–52.0)
Hemoglobin: 14.3 g/dL (ref 13.0–17.0)
MCH: 32.9 pg (ref 26.0–34.0)
MCHC: 35.7 g/dL (ref 30.0–36.0)
MCV: 92.2 fL (ref 78.0–100.0)
Platelets: 129 10*3/uL — ABNORMAL LOW (ref 150–400)
RBC: 4.35 MIL/uL (ref 4.22–5.81)
RDW: 13.7 % (ref 11.5–15.5)
WBC: 12 10*3/uL — AB (ref 4.0–10.5)

## 2015-01-11 LAB — BLOOD GAS, ARTERIAL
Acid-base deficit: 0.8 mmol/L (ref 0.0–2.0)
Acid-base deficit: 1 mmol/L (ref 0.0–2.0)
BICARBONATE: 23.6 meq/L (ref 20.0–24.0)
Bicarbonate: 23.2 mEq/L (ref 20.0–24.0)
DRAWN BY: 39899
Drawn by: 39899
FIO2: 0.9 %
FIO2: 0.9 %
MECHVT: 550 mL
MECHVT: 480 mL
O2 Saturation: 97.7 %
O2 Saturation: 97.5 %
PATIENT TEMPERATURE: 98.6
PEEP/CPAP: 18 cmH2O
PEEP: 16 cmH2O
PH ART: 7.41 (ref 7.350–7.450)
PH ART: 7.402 (ref 7.350–7.450)
Patient temperature: 94.6
RATE: 24 resp/min
RATE: 26 resp/min
TCO2: 24.3 mmol/L (ref 0–100)
TCO2: 24.8 mmol/L (ref 0–100)
pCO2 arterial: 37.3 mmHg (ref 35.0–45.0)
pCO2 arterial: 37.5 mmHg (ref 35.0–45.0)
pO2, Arterial: 112 mmHg — ABNORMAL HIGH (ref 80.0–100.0)
pO2, Arterial: 95.5 mmHg (ref 80.0–100.0)

## 2015-01-11 LAB — URINALYSIS, ROUTINE W REFLEX MICROSCOPIC
Glucose, UA: 250 mg/dL — AB
HGB URINE DIPSTICK: NEGATIVE
Ketones, ur: 15 mg/dL — AB
Leukocytes, UA: NEGATIVE
Nitrite: NEGATIVE
Protein, ur: 30 mg/dL — AB
Specific Gravity, Urine: 1.017 (ref 1.005–1.030)
UROBILINOGEN UA: 2 mg/dL — AB (ref 0.0–1.0)
pH: 5.5 (ref 5.0–8.0)

## 2015-01-11 LAB — BILIRUBIN, TOTAL: Total Bilirubin: 2.7 mg/dL — ABNORMAL HIGH (ref 0.3–1.2)

## 2015-01-11 LAB — GLUCOSE, CAPILLARY
GLUCOSE-CAPILLARY: 361 mg/dL — AB (ref 65–99)
GLUCOSE-CAPILLARY: 112 mg/dL — AB (ref 65–99)
GLUCOSE-CAPILLARY: 137 mg/dL — AB (ref 65–99)
Glucose-Capillary: 308 mg/dL — ABNORMAL HIGH (ref 65–99)
Glucose-Capillary: 124 mg/dL — ABNORMAL HIGH (ref 65–99)
Glucose-Capillary: 121 mg/dL — ABNORMAL HIGH (ref 65–99)
Glucose-Capillary: 201 mg/dL — ABNORMAL HIGH (ref 65–99)

## 2015-01-11 LAB — POCT I-STAT 3, ART BLOOD GAS (G3+)
Acid-base deficit: 3 mmol/L — ABNORMAL HIGH (ref 0.0–2.0)
Bicarbonate: 25.1 mEq/L — ABNORMAL HIGH (ref 20.0–24.0)
O2 SAT: 88 %
PH ART: 7.28 — AB (ref 7.350–7.450)
TCO2: 27 mmol/L (ref 0–100)
pCO2 arterial: 53.2 mmHg — ABNORMAL HIGH (ref 35.0–45.0)
pO2, Arterial: 61 mmHg — ABNORMAL LOW (ref 80.0–100.0)

## 2015-01-11 LAB — LACTIC ACID, PLASMA
LACTIC ACID, VENOUS: 1.8 mmol/L (ref 0.5–2.0)
Lactic Acid, Venous: 1.8 mmol/L (ref 0.5–2.0)
Lactic Acid, Venous: 2.2 mmol/L (ref 0.5–2.0)

## 2015-01-11 LAB — MRSA PCR SCREENING: MRSA by PCR: NEGATIVE

## 2015-01-11 LAB — URINE MICROSCOPIC-ADD ON

## 2015-01-11 LAB — STREP PNEUMONIAE URINARY ANTIGEN: Strep Pneumo Urinary Antigen: NEGATIVE

## 2015-01-11 LAB — INFLUENZA PANEL BY PCR (TYPE A & B)
H1N1 flu by pcr: NOT DETECTED
Influenza A By PCR: NEGATIVE
Influenza B By PCR: NEGATIVE

## 2015-01-11 LAB — I-STAT CG4 LACTIC ACID, ED: LACTIC ACID, VENOUS: 1.84 mmol/L (ref 0.5–2.0)

## 2015-01-11 LAB — MAGNESIUM: Magnesium: 1.6 mg/dL — ABNORMAL LOW (ref 1.7–2.4)

## 2015-01-11 LAB — PHOSPHORUS: Phosphorus: 2.8 mg/dL (ref 2.5–4.6)

## 2015-01-11 MED ORDER — NOREPINEPHRINE BITARTRATE 1 MG/ML IV SOLN
0.0000 ug/min | INTRAVENOUS | Status: DC
  Administered 2015-01-11: 5 ug/min via INTRAVENOUS
  Administered 2015-01-11: 4 ug/min via INTRAVENOUS
  Filled 2015-01-11 (×3): qty 4

## 2015-01-11 MED ORDER — CHLORHEXIDINE GLUCONATE 0.12 % MT SOLN
15.0000 mL | Freq: Two times a day (BID) | OROMUCOSAL | Status: DC
  Administered 2015-01-11 – 2015-01-21 (×21): 15 mL via OROMUCOSAL
  Filled 2015-01-11 (×18): qty 15

## 2015-01-11 MED ORDER — POTASSIUM CHLORIDE 10 MEQ/50ML IV SOLN
10.0000 meq | INTRAVENOUS | Status: AC
  Administered 2015-01-11 (×6): 10 meq via INTRAVENOUS
  Filled 2015-01-11 (×6): qty 50

## 2015-01-11 MED ORDER — CALCIUM CARBONATE-VITAMIN D 500-200 MG-UNIT PO TABS
1.0000 | ORAL_TABLET | Freq: Every day | ORAL | Status: DC
  Filled 2015-01-11: qty 1

## 2015-01-11 MED ORDER — MIDAZOLAM HCL 2 MG/2ML IJ SOLN: 2.0000 mg | INTRAMUSCULAR | Status: DC | PRN

## 2015-01-11 MED ORDER — MAGNESIUM SULFATE 2 GM/50ML IV SOLN
2.0000 g | Freq: Once | INTRAVENOUS | Status: AC
  Administered 2015-01-11: 2 g via INTRAVENOUS
  Filled 2015-01-11: qty 50

## 2015-01-11 MED ORDER — SODIUM CHLORIDE 0.9 % IV SOLN
INTRAVENOUS | Status: DC
  Administered 2015-01-11: 21:00:00 via INTRAVENOUS

## 2015-01-11 MED ORDER — CISATRACURIUM BOLUS VIA INFUSION
0.0500 mg/kg | Freq: Once | INTRAVENOUS | Status: AC
  Administered 2015-01-11: 4 mg via INTRAVENOUS
  Filled 2015-01-11: qty 4

## 2015-01-11 MED ORDER — OSELTAMIVIR PHOSPHATE 6 MG/ML PO SUSR
75.0000 mg | Freq: Two times a day (BID) | ORAL | Status: DC
  Administered 2015-01-11: 75 mg
  Filled 2015-01-11 (×2): qty 12.5

## 2015-01-11 MED ORDER — FENTANYL BOLUS VIA INFUSION
50.0000 ug | INTRAVENOUS | Status: DC | PRN
  Administered 2015-01-12 – 2015-01-19 (×6): 50 ug via INTRAVENOUS
  Filled 2015-01-11: qty 50

## 2015-01-11 MED ORDER — FENTANYL CITRATE (PF) 100 MCG/2ML IJ SOLN: 50.0000 ug | Freq: Once | INTRAMUSCULAR | Status: DC

## 2015-01-11 MED ORDER — PANTOPRAZOLE SODIUM 40 MG IV SOLR
40.0000 mg | Freq: Every day | INTRAVENOUS | Status: DC
  Administered 2015-01-11 – 2015-01-18 (×8): 40 mg via INTRAVENOUS
  Filled 2015-01-11 (×12): qty 40

## 2015-01-11 MED ORDER — DOCUSATE SODIUM 50 MG/5ML PO LIQD
100.0000 mg | Freq: Two times a day (BID) | ORAL | Status: DC | PRN
  Filled 2015-01-11: qty 10

## 2015-01-11 MED ORDER — SODIUM CHLORIDE 0.9 % IV SOLN
1.0000 mg/h | INTRAVENOUS | Status: DC
  Administered 2015-01-12: 7 mg/h via INTRAVENOUS
  Administered 2015-01-13: 10 mg/h via INTRAVENOUS
  Administered 2015-01-13 (×2): 8 mg/h via INTRAVENOUS
  Administered 2015-01-14 – 2015-01-15 (×4): 7 mg/h via INTRAVENOUS
  Administered 2015-01-15: 6 mg/h via INTRAVENOUS
  Administered 2015-01-15: 4 mg/h via INTRAVENOUS
  Administered 2015-01-16 (×2): 10 mg/h via INTRAVENOUS
  Administered 2015-01-16: 8 mg/h via INTRAVENOUS
  Administered 2015-01-16 – 2015-01-17 (×5): 10 mg/h via INTRAVENOUS
  Administered 2015-01-18: 8 mg/h via INTRAVENOUS
  Administered 2015-01-18: 10 mg/h via INTRAVENOUS
  Administered 2015-01-18: 8 mg/h via INTRAVENOUS
  Administered 2015-01-19: 10 mg/h via INTRAVENOUS
  Administered 2015-01-19: 2 mg/h via INTRAVENOUS
  Administered 2015-01-19: 8 mg/h via INTRAVENOUS
  Filled 2015-01-11 (×25): qty 10

## 2015-01-11 MED ORDER — POTASSIUM CHLORIDE CRYS ER 20 MEQ PO TBCR: 60.0000 meq | EXTENDED_RELEASE_TABLET | Freq: Once | ORAL | Status: DC

## 2015-01-11 MED ORDER — POTASSIUM CHLORIDE 20 MEQ/15ML (10%) PO SOLN
40.0000 meq | ORAL | Status: AC
  Administered 2015-01-11 (×2): 40 meq
  Filled 2015-01-11 (×2): qty 30

## 2015-01-11 MED ORDER — BISACODYL 10 MG RE SUPP
10.0000 mg | Freq: Every day | RECTAL | Status: DC | PRN
  Administered 2015-01-16: 10 mg via RECTAL
  Filled 2015-01-11: qty 1

## 2015-01-11 MED ORDER — HEPARIN SODIUM (PORCINE) 5000 UNIT/ML IJ SOLN
5000.0000 [IU] | Freq: Three times a day (TID) | INTRAMUSCULAR | Status: DC
  Administered 2015-01-11 – 2015-01-25 (×44): 5000 [IU] via SUBCUTANEOUS
  Filled 2015-01-11 (×53): qty 1

## 2015-01-11 MED ORDER — FENTANYL CITRATE (PF) 100 MCG/2ML IJ SOLN
25.0000 ug | INTRAMUSCULAR | Status: DC | PRN
Start: 2015-01-11 — End: 2015-01-13
  Administered 2015-01-11: 100 ug via INTRAVENOUS
  Filled 2015-01-11 (×2): qty 2

## 2015-01-11 MED ORDER — CETYLPYRIDINIUM CHLORIDE 0.05 % MT LIQD
7.0000 mL | Freq: Four times a day (QID) | OROMUCOSAL | Status: DC
  Administered 2015-01-11 – 2015-01-21 (×41): 7 mL via OROMUCOSAL

## 2015-01-11 MED ORDER — PRO-STAT SUGAR FREE PO LIQD
30.0000 mL | Freq: Two times a day (BID) | ORAL | Status: DC
  Administered 2015-01-11 – 2015-01-21 (×19): 30 mL
  Filled 2015-01-11 (×23): qty 30

## 2015-01-11 MED ORDER — VITAL HIGH PROTEIN PO LIQD
1000.0000 mL | ORAL | Status: DC
  Filled 2015-01-11 (×3): qty 1000

## 2015-01-11 MED ORDER — ARTIFICIAL TEARS OP OINT
1.0000 "application " | TOPICAL_OINTMENT | Freq: Three times a day (TID) | OPHTHALMIC | Status: DC
  Administered 2015-01-11 – 2015-01-17 (×19): 1 via OPHTHALMIC
  Filled 2015-01-11 (×2): qty 3.5

## 2015-01-11 MED ORDER — PIPERACILLIN-TAZOBACTAM 3.375 G IVPB
3.3750 g | Freq: Once | INTRAVENOUS | Status: DC
  Administered 2015-01-11: 3.375 g via INTRAVENOUS

## 2015-01-11 MED ORDER — MIDAZOLAM BOLUS VIA INFUSION
2.0000 mg | INTRAVENOUS | Status: DC | PRN
  Administered 2015-01-12 – 2015-01-19 (×2): 2 mg via INTRAVENOUS
  Filled 2015-01-11 (×3): qty 2

## 2015-01-11 MED ORDER — FENTANYL CITRATE (PF) 100 MCG/2ML IJ SOLN: 100.0000 ug | Freq: Once | INTRAMUSCULAR | Status: AC

## 2015-01-11 MED ORDER — ALBUTEROL SULFATE (2.5 MG/3ML) 0.083% IN NEBU
2.5000 mg | INHALATION_SOLUTION | RESPIRATORY_TRACT | Status: DC | PRN
  Administered 2015-01-12: 2.5 mg via RESPIRATORY_TRACT
  Filled 2015-01-11: qty 3

## 2015-01-11 MED ORDER — VITAL AF 1.2 CAL PO LIQD
1000.0000 mL | ORAL | Status: DC
  Administered 2015-01-11 – 2015-01-21 (×9): 1000 mL
  Filled 2015-01-11 (×18): qty 1000

## 2015-01-11 MED ORDER — SODIUM CHLORIDE 0.9 % IV SOLN: INTRAVENOUS | Status: DC

## 2015-01-11 MED ORDER — LEVOFLOXACIN IN D5W 750 MG/150ML IV SOLN: 750.0000 mg | Freq: Once | INTRAVENOUS | Status: DC

## 2015-01-11 MED ORDER — GABAPENTIN 400 MG PO CAPS: 1200.0000 mg | ORAL_CAPSULE | Freq: Three times a day (TID) | ORAL | Status: DC

## 2015-01-11 MED ORDER — PIPERACILLIN-TAZOBACTAM 3.375 G IVPB
3.3750 g | Freq: Three times a day (TID) | INTRAVENOUS | Status: DC
  Filled 2015-01-11 (×2): qty 50

## 2015-01-11 MED ORDER — FENTANYL CITRATE (PF) 2500 MCG/50ML IJ SOLN
25.0000 ug/h | INTRAMUSCULAR | Status: DC
  Administered 2015-01-11: 150 ug/h via INTRAVENOUS
  Administered 2015-01-12: 200 ug/h via INTRAVENOUS
  Administered 2015-01-12: 300 ug/h via INTRAVENOUS
  Administered 2015-01-13: 250 ug/h via INTRAVENOUS
  Administered 2015-01-13: 300 ug/h via INTRAVENOUS
  Administered 2015-01-14 – 2015-01-15 (×3): 225 ug/h via INTRAVENOUS
  Administered 2015-01-15 – 2015-01-16 (×2): 250 ug/h via INTRAVENOUS
  Administered 2015-01-16: 400 ug/h via INTRAVENOUS
  Administered 2015-01-16: 300 ug/h via INTRAVENOUS
  Administered 2015-01-17 (×3): 400 ug/h via INTRAVENOUS
  Administered 2015-01-18: 375 ug/h via INTRAVENOUS
  Administered 2015-01-18: 350 ug/h via INTRAVENOUS
  Administered 2015-01-18: 300 ug/h via INTRAVENOUS
  Administered 2015-01-18 – 2015-01-19 (×2): 325 ug/h via INTRAVENOUS
  Administered 2015-01-20: 200 ug/h via INTRAVENOUS
  Administered 2015-01-20: 100 ug/h via INTRAVENOUS
  Filled 2015-01-11 (×24): qty 50

## 2015-01-11 MED ORDER — SODIUM CHLORIDE 0.9 % IV SOLN
25.0000 ug/h | INTRAVENOUS | Status: DC
  Administered 2015-01-11: 50 ug/h via INTRAVENOUS
  Filled 2015-01-11: qty 50

## 2015-01-11 MED ORDER — SODIUM CHLORIDE 0.9 % IV SOLN
250.0000 mL | INTRAVENOUS | Status: DC | PRN
Start: 2015-01-11 — End: 2015-01-24
  Administered 2015-01-14 – 2015-01-15 (×2): 250 mL via INTRAVENOUS
  Administered 2015-01-22: 10 mL/h via INTRAVENOUS

## 2015-01-11 MED ORDER — MIDAZOLAM HCL 2 MG/2ML IJ SOLN
INTRAMUSCULAR | Status: AC
  Filled 2015-01-11: qty 2

## 2015-01-11 MED ORDER — SODIUM CHLORIDE 0.9 % IV SOLN
3.0000 ug/kg/min | INTRAVENOUS | Status: DC
  Administered 2015-01-11 – 2015-01-12 (×2): 3 ug/kg/min via INTRAVENOUS
  Administered 2015-01-12 – 2015-01-13 (×2): 3.5 ug/kg/min via INTRAVENOUS
  Administered 2015-01-14 – 2015-01-16 (×4): 3 ug/kg/min via INTRAVENOUS
  Administered 2015-01-16 – 2015-01-17 (×2): 7 ug/kg/min via INTRAVENOUS
  Filled 2015-01-11 (×10): qty 20

## 2015-01-11 MED ORDER — MIDAZOLAM HCL 2 MG/2ML IJ SOLN: 2.0000 mg | Freq: Once | INTRAMUSCULAR | Status: AC

## 2015-01-11 MED ORDER — POTASSIUM CHLORIDE 10 MEQ/100ML IV SOLN
10.0000 meq | INTRAVENOUS | Status: DC
  Administered 2015-01-11 (×4): 10 meq via INTRAVENOUS
  Filled 2015-01-11 (×4): qty 100

## 2015-01-11 MED ORDER — OXYCODONE HCL 5 MG PO TABS: 10.0000 mg | ORAL_TABLET | ORAL | Status: DC | PRN

## 2015-01-11 MED ORDER — SODIUM CHLORIDE 0.9 % IV SOLN
1.0000 mg/h | INTRAVENOUS | Status: DC
  Administered 2015-01-11: 2 mg/h via INTRAVENOUS
  Administered 2015-01-11: 1 mg/h via INTRAVENOUS
  Administered 2015-01-12: 5 mg/h via INTRAVENOUS
  Filled 2015-01-11 (×3): qty 10

## 2015-01-11 MED ORDER — MIDAZOLAM HCL 2 MG/2ML IJ SOLN
2.0000 mg | INTRAMUSCULAR | Status: DC | PRN
  Administered 2015-01-11 (×2): 2 mg via INTRAVENOUS
  Filled 2015-01-11 (×2): qty 2

## 2015-01-11 MED ORDER — VANCOMYCIN HCL IN DEXTROSE 1-5 GM/200ML-% IV SOLN
1000.0000 mg | Freq: Once | INTRAVENOUS | Status: AC
  Administered 2015-01-11: 1000 mg via INTRAVENOUS
  Filled 2015-01-11: qty 200

## 2015-01-11 MED ORDER — PANTOPRAZOLE SODIUM 20 MG PO TBEC: 20.0000 mg | DELAYED_RELEASE_TABLET | Freq: Every day | ORAL | Status: DC

## 2015-01-11 MED ORDER — FENTANYL CITRATE (PF) 100 MCG/2ML IJ SOLN: 100.0000 ug | Freq: Once | INTRAMUSCULAR | Status: AC | PRN

## 2015-01-11 MED ORDER — VANCOMYCIN HCL IN DEXTROSE 1-5 GM/200ML-% IV SOLN
1000.0000 mg | Freq: Two times a day (BID) | INTRAVENOUS | Status: DC
  Administered 2015-01-11 – 2015-01-12 (×2): 1000 mg via INTRAVENOUS
  Filled 2015-01-11 (×3): qty 200

## 2015-01-11 MED ORDER — LEVOFLOXACIN IN D5W 750 MG/150ML IV SOLN
750.0000 mg | INTRAVENOUS | Status: DC
  Administered 2015-01-11 – 2015-01-24 (×13): 750 mg via INTRAVENOUS
  Filled 2015-01-11 (×21): qty 150

## 2015-01-11 MED ORDER — BACLOFEN 10 MG PO TABS
10.0000 mg | ORAL_TABLET | Freq: Three times a day (TID) | ORAL | Status: DC
  Administered 2015-01-11 – 2015-01-13 (×7): 10 mg via ORAL
  Filled 2015-01-11 (×9): qty 1

## 2015-01-11 MED ORDER — MIDAZOLAM HCL 2 MG/2ML IJ SOLN: 2.0000 mg | Freq: Once | INTRAMUSCULAR | Status: AC | PRN

## 2015-01-11 MED ORDER — FENTANYL BOLUS VIA INFUSION
50.0000 ug | INTRAVENOUS | Status: DC | PRN
  Filled 2015-01-11: qty 50

## 2015-01-11 MED ORDER — INSULIN GLARGINE 100 UNIT/ML ~~LOC~~ SOLN
15.0000 [IU] | Freq: Two times a day (BID) | SUBCUTANEOUS | Status: DC
  Administered 2015-01-11 – 2015-01-14 (×7): 15 [IU] via SUBCUTANEOUS
  Filled 2015-01-11 (×11): qty 0.15

## 2015-01-11 MED ORDER — BACLOFEN 10 MG PO TABS: 10.0000 mg | ORAL_TABLET | Freq: Three times a day (TID) | ORAL | Status: DC

## 2015-01-11 MED ORDER — SERTRALINE HCL 100 MG PO TABS: 100.0000 mg | ORAL_TABLET | Freq: Every day | ORAL | Status: DC

## 2015-01-11 MED ORDER — INSULIN ASPART 100 UNIT/ML ~~LOC~~ SOLN
0.0000 [IU] | SUBCUTANEOUS | Status: DC
  Administered 2015-01-11: 2 [IU] via SUBCUTANEOUS
  Administered 2015-01-11: 5 [IU] via SUBCUTANEOUS
  Administered 2015-01-11: 11 [IU] via SUBCUTANEOUS
  Administered 2015-01-11: 2 [IU] via SUBCUTANEOUS
  Administered 2015-01-11: 3 [IU] via SUBCUTANEOUS
  Administered 2015-01-11: 15 [IU] via SUBCUTANEOUS
  Administered 2015-01-12 (×2): 8 [IU] via SUBCUTANEOUS
  Administered 2015-01-12 (×2): 5 [IU] via SUBCUTANEOUS
  Administered 2015-01-12: 8 [IU] via SUBCUTANEOUS
  Administered 2015-01-13 (×3): 5 [IU] via SUBCUTANEOUS
  Administered 2015-01-13: 3 [IU] via SUBCUTANEOUS
  Administered 2015-01-13: 8 [IU] via SUBCUTANEOUS
  Administered 2015-01-13: 2 [IU] via SUBCUTANEOUS
  Administered 2015-01-14: 5 [IU] via SUBCUTANEOUS
  Administered 2015-01-14 (×2): 3 [IU] via SUBCUTANEOUS
  Administered 2015-01-14: 5 [IU] via SUBCUTANEOUS
  Administered 2015-01-14: 8 [IU] via SUBCUTANEOUS
  Administered 2015-01-14: 5 [IU] via SUBCUTANEOUS
  Administered 2015-01-15: 8 [IU] via SUBCUTANEOUS
  Administered 2015-01-15: 3 [IU] via SUBCUTANEOUS
  Administered 2015-01-15: 5 [IU] via SUBCUTANEOUS
  Administered 2015-01-15: 3 [IU] via SUBCUTANEOUS
  Administered 2015-01-15: 8 [IU] via SUBCUTANEOUS
  Administered 2015-01-15 – 2015-01-16 (×2): 3 [IU] via SUBCUTANEOUS
  Administered 2015-01-17 (×2): 8 [IU] via SUBCUTANEOUS
  Administered 2015-01-17 (×3): 5 [IU] via SUBCUTANEOUS
  Administered 2015-01-17: 8 [IU] via SUBCUTANEOUS
  Administered 2015-01-18: 5 [IU] via SUBCUTANEOUS
  Administered 2015-01-18: 2 [IU] via SUBCUTANEOUS
  Administered 2015-01-18 (×2): 3 [IU] via SUBCUTANEOUS
  Administered 2015-01-18: 11 [IU] via SUBCUTANEOUS
  Administered 2015-01-18: 3 [IU] via SUBCUTANEOUS
  Administered 2015-01-18: 5 [IU] via SUBCUTANEOUS
  Administered 2015-01-19: 3 [IU] via SUBCUTANEOUS
  Administered 2015-01-19: 8 [IU] via SUBCUTANEOUS
  Administered 2015-01-19: 3 [IU] via SUBCUTANEOUS
  Administered 2015-01-19: 5 [IU] via SUBCUTANEOUS
  Administered 2015-01-19: 3 [IU] via SUBCUTANEOUS
  Administered 2015-01-20: 8 [IU] via SUBCUTANEOUS
  Administered 2015-01-20: 5 [IU] via SUBCUTANEOUS
  Administered 2015-01-20: 15 [IU] via SUBCUTANEOUS
  Administered 2015-01-20 (×2): 8 [IU] via SUBCUTANEOUS
  Administered 2015-01-20: 3 [IU] via SUBCUTANEOUS
  Administered 2015-01-20: 5 [IU] via SUBCUTANEOUS
  Administered 2015-01-21 (×2): 3 [IU] via SUBCUTANEOUS
  Administered 2015-01-21 (×2): 5 [IU] via SUBCUTANEOUS
  Administered 2015-01-21: 2 [IU] via SUBCUTANEOUS
  Administered 2015-01-22 (×2): 3 [IU] via SUBCUTANEOUS
  Administered 2015-01-22 (×2): 8 [IU] via SUBCUTANEOUS
  Administered 2015-01-22 – 2015-01-23 (×2): 3 [IU] via SUBCUTANEOUS
  Administered 2015-01-23: 15 [IU] via SUBCUTANEOUS
  Administered 2015-01-23 – 2015-01-24 (×2): 3 [IU] via SUBCUTANEOUS
  Administered 2015-01-24: 1 [IU] via SUBCUTANEOUS
  Administered 2015-01-24: 3 [IU] via SUBCUTANEOUS
  Administered 2015-01-24: 5 [IU] via SUBCUTANEOUS
  Administered 2015-01-25 (×2): 2 [IU] via SUBCUTANEOUS

## 2015-01-11 MED ORDER — VITAMIN B-12 100 MCG PO TABS: 100.0000 ug | ORAL_TABLET | Freq: Every day | ORAL | Status: DC

## 2015-01-11 NOTE — Progress Notes (Signed)
No wakeup assessment today patient intubated overnight. Will continue to monitor closely.

## 2015-01-11 NOTE — Progress Notes (Signed)
Critical value, Potassium 2.6 MD aware. Previous potassium level was 2.6. Order for IV potassium just placed about 30 minutes prior to critical value.

## 2015-01-11 NOTE — Progress Notes (Signed)
CRITICAL VALUE ALERT  Critical value received:  latic acid 2.2  Date of notification:  01/11/15  Time of notification:  0430  Critical value read back:Yes.    Nurse who received alert:  Tory EmeraldMichelle Melika Reder, RN  MD notified (1st page):    Time of first page:  0430  MD notified (2nd page):  Time of second page:  Responding MD:  CCM PA  Time MD responded:  0430

## 2015-01-11 NOTE — Procedures (Signed)
Bedside Bronchoscopy Procedure Note [name] [id number] 07/28/1961  Procedure: Bronchoscopy Indications: Obtain specimens for culture and/or other diagnostic studies  Procedure Details: ET Tube Size: ET Tube secured at lip (cm): Bite block in place: Yes In preparation for procedure, Patient hyper-oxygenated with 100 % FiO2 Airway entered and the following bronchi were examined: RUL, RML, RLL, LUL, LLL and Bronchi.   Bronchoscope removed.    Evaluation BP 110/64 mmHg  Pulse 92  Temp(Src) 97.6 F (36.4 C) (Oral)  Resp 27  Ht 5\' 8"  (1.727 m)  Wt 175 lb 7.8 oz (79.6 kg)  BMI 26.69 kg/m2  SpO2 100% Breath Sounds:Clear O2 sats: stable throughout Patient's Current Condition: stable Specimens:  Sent serosanguinous fluid Complications: No apparent complications Patient did tolerate procedure well.   [name], [name] 01/11/2015, 12:40 PM

## 2015-01-11 NOTE — Procedures (Signed)
Central Venous Catheter Insertion Procedure Note Faythe Gheearon P Mccown [id number] 05/20/1961  Procedure: Insertion of Central Venous Catheter Indications: Assessment of intravascular volume, Drug and/or fluid administration and Frequent blood sampling  Procedure Details Consent: Unable to obtain consent because of altered level of consciousness. Time Out: Verified patient identification, verified procedure, site/side was marked, verified correct patient position, special equipment/implants available, medications/allergies/relevent history reviewed, required imaging and test results available.  Performed  Maximum sterile technique was used including antiseptics, cap, gloves, gown, hand hygiene, mask and sheet. Skin prep: Chlorhexidine; local anesthetic administered A antimicrobial bonded/coated triple lumen catheter was placed in the right internal jugular vein using the Seldinger technique.  Evaluation Blood flow good Complications: No apparent complications Patient did tolerate procedure well. Chest X-ray ordered to verify placement.  CXR: pending.  Procedure performed under direct ultrasound guidance for real time vessel cannulation.      Rutherford Guysahul Desai, GeorgiaPA - C [place] [facility] Pager: [phone]  or [phone] 01/11/2015, 6:11 AM   I was present for and supervised the entire procedure  Billy Fischeravid Savaughn Karwowski, MD ; [facility] service Mobile [phone].  After 5:30 PM or weekends, call [phone]

## 2015-01-11 NOTE — ED Notes (Signed)
Please contact wife, [name], at [phone]

## 2015-01-11 NOTE — Progress Notes (Signed)
eLink Physician-Brief Progress Note Patient Name: [name] DOB: 08/10/1961 MRN: [id number]    eLink Physician Progress Note and Electrolyte Replacement  Patient Name: [name] DOB: 02/15/1961 MRN: [id number]  Date of Service  01/11/2015   HPI/Events of Note    Recent Labs Lab 01/10/15 2122 01/11/15 0155 01/11/15 1300  NA 124* 124* 133*  K 2.6* 2.6* 2.8*  CL 79* 86* 100*  CO2 24 26 24   GLUCOSE 338* 365* 157*  BUN 25* 23* 24*  CREATININE 1.31* 1.21 0.89  CALCIUM 8.2* 7.1* 7.3*  MG  --  1.6*  --   PHOS  --  2.8  --     Estimated Creatinine Clearance: 93.9 mL/min (by C-G formula based on Cr of 0.89).  Intake/Output      05/18 0701 - 05/19 0700 05/19 0701 - 05/20 0700   I.V. (mL/kg) 604.6 (7.6) 588.6 (7.4)   NG/GT  23.8   IV Piggyback 700    Total Intake(mL/kg) 1304.6 (16.4) 612.4 (7.7)   Urine (mL/kg/hr) 450 615 (0.8)   Total Output 450 615   Net +854.6 -2.6         - I/O DETAILED x 24h    Total I/O In: 612.4 [I.V.:588.6; NG/GT:23.8] Out: 615 [Urine:615] - I/O THIS SHIFT    ASSESSMENT Severe hypokalemia  eICURN Interventions  Check mag and phos 01/12/15 IV KCL 10meq x 6 runs   ASSESSMENT: MAJOR ELECTROLYTE      Dr. Kalman ShanMurali Maryem Shuffler, M.D., [facility] HospitalF.C.C.P Pulmonary and Critical Care Medicine Staff Physician [facility] [place] [facility] Pager: [phone], If no answer or between  15:00h - 7:00h: call [phone]  01/11/2015 5:10 PM

## 2015-01-11 NOTE — Procedures (Signed)
Oral Intubation Procedure Note   Procedure: Intubation Indications: Respiratory insufficiency, failed extubation Consent: Unable to obtain consent because of altered level of consciousness. Time Out: Verified patient identification, verified procedure, site/side was marked, verified correct patient position, special equipment/implants available, medications/allergies/relevent history reviewed, required imaging and test results available.   Pre-meds: Etomidate 20 mg IV  Neuromuscular blockade: Rocuronium 50 mg IV  Laryngoscope: #4 MAC  Visualization: cords fully visualized  ETT: 8.0 ETT passed on first attempt and secured @ 23 cm at incisors   Findings: normal upper airway   Evaluation:  CXR pending  Pt tolerated procedure well. Temporary desaturation to SpO2 70%   Billy Fischeravid Momo Braun, MD ; [facility] Mobile [phone].  After 5:30 PM or weekends, call [phone]

## 2015-01-11 NOTE — Progress Notes (Signed)
[facility] PCCM  S: Intubated this morning, oxygenation improving but on very high PEEP and FiO2, required levophed  O: Filed Vitals:   01/11/15 0700 01/11/15 0715 01/11/15 0756 01/11/15 0822  BP: 115/82   80/68  Pulse: 88 92  91  Temp:   97.6 F (36.4 C)   TempSrc:   Oral   Resp: 20 21  24   Height:      Weight:      SpO2: 100% 100%  95%   Vent Mode:  [-] PRVC FiO2 (%):  [90 %-100 %] 90 % Set Rate:  [24 bmp] 24 bmp Vt Set:  [550 mL] 550 mL PEEP:  [14 cmH20-18 cmH20] 18 cmH20 Plateau Pressure:  [36 cmH20] 36 cmH20  Gen: sedated on vent HENT: ETT in place, NCAT Eyes: pupils pinpoint but equal and reactive PULM: rhonchi bilaterally CV: RRR, no mgr, trace edema GI: BS+, soft, nontender Derm: no cyanosis or rash Neuro: heavily sedated on vent  CXR images personally reviewed > severe bibasilar airspace disease, ETT in place, CVL in place, sternal plate noted  Impression/Plan 1) ARDS > due to HCAP, agree differential includes legionella; oxygenation improved on current vent settings, will watch ABG closely and hold decision on NMB unless oxygenation not improving; he needs a bronchoscopy for lower respiratory sample; will add tamiflu until lower respiratory sample back 2) CAD 3) DM2 > continue lantus, just give tonight as blood sugar has improved 4) Nutrtion> continue tube feedings  Additional CC time by me 40 minutes  Heber CarolinaBrent Ed Mandich, MD [facility] Pager: [phone] Cell: [phone] If no response, call [phone]

## 2015-01-11 NOTE — Progress Notes (Signed)
eLink Physician-Brief Progress Note Patient Name: [name] DOB: 09/21/1960 MRN: [id number]   Date of Service  01/11/2015  HPI/Events of Note  Hypoxic on bipap10/5 100%  eICU Interventions  Increase EPAP to 10     Intervention Category Major Interventions: Respiratory failure - evaluation and management  Anshu Wehner V. 01/11/2015, 1:55 AM

## 2015-01-11 NOTE — Progress Notes (Signed)
ABG obtained on pt currently on bipap.. Ph 7.28 c02 53 po2 61. so2 88.  Called results to Dr. Vassie LollAlva.  He said monitor for a couple more hours and re evaluate patient for possible intubation.

## 2015-01-11 NOTE — ED Notes (Signed)
[name] at bedside to re-evaluate pt. Resp Therapy placing pt on BiPap at this time.

## 2015-01-11 NOTE — ED Notes (Signed)
Report given to RN on 2C. 

## 2015-01-11 NOTE — Progress Notes (Signed)
RN called RT to patient room for patient sats in the low 80s.  Performed recruitment maneuver.  Lavaged and suctioned patient.  Increased PEEP and FIO2.  Sats improved to 97%.  Will continue to monitor.

## 2015-01-11 NOTE — Progress Notes (Signed)
Initial Nutrition Assessment  DOCUMENTATION CODES:  Not applicable  INTERVENTION:  Tube feeding (Initiate TF via OGT with VItal AF 1.2 at 25 ml/h and Prostat 30 ml BID on day 1; on day 2, increase to goal rate of 55 ml/h (1320 ml per day) to provide 1784 kcals, 129 gm protein, 1071 ml free water daily.)   NUTRITION DIAGNOSIS:  Inadequate oral intake related to inability to eat as evidenced by NPO status.   GOAL:  Patient will meet greater than or equal to 90% of their needs   MONITOR:  Vent status, Labs, Weight trends, TF tolerance, Skin, I & O's  REASON FOR ASSESSMENT:  Ventilator, Consult Enteral/tube feeding initiation and management  ASSESSMENT: 54 [facility]. [facility] with acute hypoxic respiratory failure and severe bilateral infiltrates. Required intubation in AM 5/19. Treated as PNA, ARDS.   Patient is currently intubated on ventilator support. OGT in place.  MV: 12.5 L/min Temp (24hrs), Avg:96.7 F (35.9 C), Min:94.5 F (34.7 C), Max:97.6 F (36.4 C)  Propofol: n/a  Nutrition-focused physical exam WDL. No hx of weight loss per chart review.   Spoke with RN who confirmed TF orders for TF protocol (Vital High Protein @ 40 ml/hr with 30 ml Prostat BID- which provides 1160 kcals, 114 grams protein, and 803 ml fluid daily (meets 64% of estimated kcal needs and 95% of estimated protein needs). RN awaiting RD recommendations and changes prior to hanging TF. RD changed formula to Vital AF 1.2 to better meet pt's nutritional needs.   Height:  Ht Readings from Last 1 Encounters:  01/11/15 5\' 8"  (1.727 m)    Weight:  Wt Readings from Last 1 Encounters:  01/11/15 175 lb 7.8 oz (79.6 kg)    Ideal Body Weight:  70 kg  Wt Readings from Last 10 Encounters:  01/11/15 175 lb 7.8 oz (79.6 kg)  08/12/13 178 lb (80.74 kg)  07/30/13 176 lb (79.833 kg)  04/22/12 166 lb (75.297 kg)  03/04/11 141 lb (63.957 kg)  06/12/10 156 lb (70.761 kg)   05/01/10 163 lb (73.936 kg)  10/02/09 164 lb 4 oz (74.503 kg)  08/29/09 161 lb (73.029 kg)  05/31/09 167 lb (75.751 kg)    BMI:  Body mass index is 26.69 kg/(m^2). Overweight  Estimated Nutritional Needs:  Kcal:  1813.1  Protein:  120-130 grams  Fluid:  > 1.8 L  Skin:  Reviewed, no issues  Diet Order:  Diet NPO time specified  EDUCATION NEEDS:  No education needs identified at this time   Intake/Output Summary (Last 24 hours) at 01/11/15 1059 Last data filed at 01/11/15 0900  Gross per 24 hour  Intake 1417.83 ml  Output    900 ml  Net 517.83 ml    Last BM:  PTA  Lakota Schweppe A. Mayford KnifeWilliams, RD, LDN, CDE Pager: [phone] After hours Pager: [phone]

## 2015-01-11 NOTE — H&P (Signed)
PULMONARY / CRITICAL CARE MEDICINE   Name: [name] MRN: [id number] DOB: 18-Oct-1960    ADMISSION DATE:  01/10/2015 CONSULTATION DATE:  01/11/2015  REFERRING MD :  EDP  CHIEF COMPLAINT:  SOB, cough  INITIAL PRESENTATION:   54 y.o. M presented to [facility] ED late 5/18 PM with acute hypoxic respiratory failure and severe bilateral infiltrates. Required intubation in AM 5/19. Treated as PNA, ARDS.   MAJOR EVENTS/TEST RESULTS: 5/18 Admit via ED to ICU/PCCM service 5/19 AM: intubated. ARDS protocol 5/19 RUQ [place] (elevated bilirubin):   INDWELLING DEVICES:: ETT 5/19 >>  R IJ CVL 5/19 >>  A-line (ordered) 5/19 >>   MICRO DATA: MRSA PCR 5/18 >> NEG Blood 5/18 >>  Strep Ag 5/19 >>  Legionella Ag 5/19 >>  Resp 5/19 >>  Flu panel (trach asp) 5/19 >>  RVP (trach asp) 5/19 >>  Legionella cx (trach asp) 5/19 >>   ANTIMICROBIALS:  Pip-tazo 5/18 X 1 Vanc 5/18 >>  Levofloxacin 5/19 >>     HISTORY OF PRESENT ILLNESS:   [name] is a 54 y.o. M with PMH as outlined below.  He presented to [facility] ED 5/18 for SOB associated with productive cough, fever, and generalized weakness x 4 - 5 days.  Symptoms have persisted and not eased off at all therefore he came to ED for further evaluation.  Cough productive of dark sputum.  His wife states that pt has had temp to 101 at home; however, in ED he was hypothermic.  Has also had mild nausea with dry heaving, no true vomiting.  Denies diarrhea, abdominal pain, myalgias, chest pain.  His wife was apparently just diagnosed with PNA 1 day prior and he feels he may have caught something from her over the past few days.  Apparently when pt arrived in ED, he was mildly diaphoretic and cyanotic with blue fingers and lowe extremities.  His SpO2 was initially in the 50's on room air.  He was subsequently placed on NRB with improvement in sats to 90's.  CXR revealed diffuse bilateral airspace disease, possibly reflecting diffuse pneumonia vs pulmonary edema vs  ARDS.  Initial lactate elevated at 5.45.  He was started on abx for CAP based on his symptoms and PCCM was called for admission.    He does not smoke.  Denies any recent travel or periods of prolonged immobilization.  No hx of any clots of malignancies.  Of note, he was hospitalized in March at the [facility] and had a sternal plate placed for sternal fracture.  PAST MEDICAL HISTORY :   has a past medical history of Hypertension; GERD (gastroesophageal reflux disease); Osteoporosis; Migraine; Chronic lower back pain; Vitamin D deficiency; Hepatomegaly; Pancreatitis; Diabetes mellitus; and Fracture acetabulum-closed (04/09/2013).  has past surgical history that includes Cholecystectomy; Hernia repair; Laminectomy; and Back surgery. Prior to Admission medications   Medication Sig Start Date End Date Taking? Authorizing Provider  albuterol (PROVENTIL HFA;VENTOLIN HFA) 108 (90 BASE) MCG/ACT inhaler Inhale 1 puff into the lungs every 6 (six) hours as needed for wheezing or shortness of breath.   Yes Historical Provider, MD  baclofen (LIORESAL) 10 MG tablet Take 10 mg by mouth 3 (three) times daily.   Yes Historical Provider, MD  Calcium Carb-Cholecalciferol (CALCIUM-VITAMIN D) 600-400 MG-UNIT TABS Take 1 tablet by mouth daily.   Yes Historical Provider, MD  cyanocobalamin 1000 MCG tablet Take 100 mcg by mouth daily.   Yes Historical Provider, MD  ergocalciferol (VITAMIN D2) 50000 UNITS capsule Take  50,000 Units by mouth once a week. On Saturday   Yes Historical Provider, MD  fluticasone (VERAMYST) 27.5 MCG/SPRAY nasal spray Place 2 sprays into the nose daily.   Yes Historical Provider, MD  gabapentin (NEURONTIN) 400 MG capsule Take 1,200 mg by mouth 3 (three) times daily.   Yes Historical Provider, MD  hydrochlorothiazide (MICROZIDE) 12.5 MG capsule Take 12.5 mg by mouth daily.   Yes Historical Provider, MD  insulin aspart (NOVOLOG FLEXPEN) 100 UNIT/ML SOPN FlexPen Inject 7 Units into the skin 3 (three) times  daily with meals. 08/24/13  Yes Eulas Post, MD  Insulin Glargine (LANTUS SOLOSTAR) 100 UNIT/ML SOPN Inject 30 Units into the skin daily. 08/24/13  Yes Eulas Post, MD  MORPHINE SULFATE PO Take 1 tablet by mouth 3 (three) times daily. 9m tablet. Take one tablet tid a day per patient   Yes Historical Provider, MD  oxyCODONE (OXY IR/ROXICODONE) 5 MG immediate release tablet Take 10 mg by mouth every 4 (four) hours as needed for pain.   Yes Historical Provider, MD  pantoprazole (PROTONIX) 20 MG tablet Take 20 mg by mouth daily.   Yes Historical Provider, MD  ramipril (ALTACE) 5 MG capsule Take 5 mg by mouth daily.   Yes Historical Provider, MD  sertraline (ZOLOFT) 100 MG tablet Take 100 mg by mouth daily.    Yes Historical Provider, MD  ramipril (ALTACE) 10 MG capsule Take 1 capsule (10 mg total) by mouth daily. Patient not taking: Reported on 01/10/2015 08/12/13   BEulas Post MD   Allergies  Allergen Reactions  . Ketorolac Tromethamine     Hives (toradol)    FAMILY HISTORY:  Family History  Problem Relation Age of Onset  . Cancer      breast/grandmother, prostate/grandfather    SOCIAL HISTORY:  reports that he quit smoking about 7 years ago. He has never used smokeless tobacco. He reports that he does not drink alcohol or use illicit drugs.  REVIEW OF SYSTEMS:   All negative; except for those that are bolded, which indicate positives.  Constitutional: weight loss, weight gain, night sweats, fevers, chills, fatigue, weakness.  HEENT: headaches, sore throat, sneezing, nasal congestion, post nasal drip, difficulty swallowing, tooth/dental problems, visual complaints, visual changes, ear aches. Neuro: difficulty with speech, weakness, numbness, ataxia. CV:  chest pain, orthopnea, PND, swelling in lower extremities, dizziness, palpitations, syncope.  Resp: cough, hemoptysis, dyspnea, wheezing. GI  heartburn, indigestion, abdominal pain, nausea, vomiting, diarrhea,  constipation, change in bowel habits, loss of appetite, hematemesis, melena, hematochezia.  GU: dysuria, change in color of urine, urgency or frequency, flank pain, hematuria. MSK: joint pain or swelling, decreased range of motion, chronic back pain. Psych: change in mood or affect, depression, anxiety, suicidal ideations, homicidal ideations. Skin: rash, itching, bruising.   SUBJECTIVE:   VITAL SIGNS: Temp:  [96.3 F (35.7 C)-97.5 F (36.4 C)] 96.3 F (35.7 C) (05/18 2200) Pulse Rate:  [88-99] 88 (05/18 2330) Resp:  [16-22] 22 (05/18 2330) BP: (94-116)/(51-87) 109/87 mmHg (05/18 2330) SpO2:  [48 %-96 %] 96 % (05/18 2330) HEMODYNAMICS:   VENTILATOR SETTINGS:   INTAKE / OUTPUT: Intake/Output    None     PHYSICAL EXAMINATION: General: WDWN male, resting in stretcher, in NAD. Neuro: A&O x 3, non-focal.  HEENT: [place]/AT. PERRL, sclerae anicteric. Cardiovascular: RRR, no M/R/G.  Lungs: Respirations even and unlabored.  Coarse rhonchi bilaterally. Abdomen: BS x 4, soft, NT/ND. + hepatomegaly. Musculoskeletal: No gross deformities, no edema.  Skin: Intact, warm, no  rashes.  LABS:  CBC  Recent Labs Lab 01/10/15 2122  WBC 17.9*  HGB 16.1  HCT 45.9  PLT 160   Coag's No results for input(s): APTT, INR in the last 168 hours. BMET  Recent Labs Lab 01/10/15 2122  NA 124*  K 2.6*  CL 79*  CO2 24  BUN 25*  CREATININE 1.31*  GLUCOSE 338*   Electrolytes  Recent Labs Lab 01/10/15 2122  CALCIUM 8.2*   Sepsis Markers  Recent Labs Lab 01/10/15 2212  LATICACIDVEN 5.45*   ABG  Recent Labs Lab 01/10/15 2219  PHART 7.336*  PCO2ART 50.8*  PO2ART 54.0*   Liver Enzymes  Recent Labs Lab 01/10/15 2122  AST 315*  ALT 62  ALKPHOS 214*  BILITOT 3.2*  ALBUMIN 3.0*   Cardiac Enzymes No results for input(s): TROPONINI, PROBNP in the last 168 hours. Glucose  Recent Labs Lab 01/10/15 2102  GLUCAP 349*    Imaging Dg Chest Port 1 View  01/10/2015    CLINICAL DATA:  Acute onset of left-sided chest pain, fever and hypoxia. Shortness of breath and productive cough. Initial encounter.  EXAM: PORTABLE CHEST - 1 VIEW  COMPARISON:  Chest radiograph performed 07/16/2010  FINDINGS: The lungs are well-aerated. Diffuse bilateral airspace opacification may reflect pulmonary edema, diffuse pneumonia or ARDS. There is no evidence of pleural effusion or pneumothorax.  The cardiomediastinal silhouette is borderline normal in size. No acute osseous abnormalities are seen. Postoperative change is noted overlying the mid thoracic spine.  IMPRESSION: Diffuse bilateral airspace opacification may reflect pulmonary edema, diffuse pneumonia or ARDS.   Electronically Signed   By: Garald Balding M.D.   On: 01/10/2015 23:21    ASSESSMENT / PLAN:  PULMONARY A: Acute hypoxemic respiratory failure  Intubated after failed NIPPV  ARDS Suspected CAP Former smoker P:   ARDS protocol Vent bundle Daily SBT as indicated  Consider prone positioning Consider NMB protocol Will discuss with Dr Lake Bells who will assume care as of 8:00 AM 5/19  INFECTIOUS A:   Suspected PNA, consider Legionella  P:   Micro and abx as above  CARDIOVASCULAR A:  Hx HTN Eelvated lactate P:  Trend lactate Holding outpatient HCTZ, ramipril MAP goal > 65 mmHg  RENAL A:   Hyponatremia Hypokalemia AG met acidosis  AKI  P:   Monitor BMET intermittently Monitor I/Os Correct electrolytes as indicated KCl repleted 5/19 NS for IVFs  GASTROINTESTINAL A:   H/O GERD, chronic PPI Elevated AST  Hyperbilirubinemia Hx pancreatitis P:   SUP: IV Pantoprazole Monitor LFTs intermittently Check RUQ [place] TF protocol 5/19  HEMATOLOGIC A:   Mild thrombocytopenia P:  DVT px: SQ heparin Monitor CBC intermittently Transfuse per usual ICU guidelines  ENDOCRINE A:   DM 2, poorly controlled P:   Lantus 15 units BID Mod scale SSI  NEUROLOGIC A:   Chronic lower back pain Vent/ICU  associated discomfort P:   Holding outpatient baclofen, oxycodone, sertraline, gabapentin. PAD protocol 5/19 - fent infusion    Family updated: Wife 5/19  Interdisciplinary Family Meeting v Palliative Care Meeting:  Due by: 5/25  CC time:  60 minutes.   Merton Border, MD ; [facility] [phone].  After 5:30 PM or weekends, call [phone]

## 2015-01-11 NOTE — Procedures (Signed)
PCCM Bronchoscopy Procedure Note  The patient was informed of the risks (including but not limited to bleeding, infection, respiratory failure, lung injury, tooth/oral injury) and benefits of the procedure and gave consent, see chart.  Indication: HCAP, severe acute respiratory failure  [facility] ICU, room [phone]  Condition pre procedure: critically ill on vent  Medications for procedure: fentanyl gtt, versed gtt  Procedure description: The bronchoscope was introduced through the endotracheal tube and passed to the bilateral lungs to the level of the subsegmental bronchi throughout the tracheobronchial tree.  Airway exam revealed ETT in good position, mild acute inflammation throughout the tracheobronchial tree. The left tracheobronchial tree was normal in appearance but the right was somewhat difficult to visualize due to thick adherent secretions in the right mainstem bronchus.  There were no clear endobronchial lesions.  Procedures performed:  1) BAL RML 40cc instilled, 20cc cloudy fluid returned  Specimens sent:  BAL: resp culture, viral panel  Condition post procedure: critically ill, no changes to vent settings  EBL: None  Complications: None  Heber CarolinaBrent Tasheika Kitzmiller, MD [place] [facility] Pager: [phone] Cell: [phone] If no response, call [phone]

## 2015-01-11 NOTE — Progress Notes (Signed)
[facility] PCCM  Worsening hypoxemia transiently, required recruitment maneuver. Considering severity of hypoxemia, will start nimbex If no improvement tomorrow then prone  Heber CarolinaBrent Enora Trillo, MD [place] [facility] Pager: [phone] Cell: [phone] If no response, call [phone]

## 2015-01-11 NOTE — Care Management Note (Signed)
Case Management Note  Patient Details  Name: Faythe Gheearon P Giovanni MRN: [id number] Date of Birth: 03/25/1961  Subjective/Objective:                  Lives at home with wife who also has pneumonia.    Action/Plan: CM will continue to follow for any discharge needs.   Expected Discharge Date:                  Expected Discharge Plan:     In-House Referral:     Discharge planning Services     Post Acute Care Choice:    Choice offered to:     DME Arranged:    DME Agency:     HH Arranged:    HH Agency:     Status of Service:     Medicare Important Message Given:    Date Medicare IM Given:    Medicare IM give by:    Date Additional Medicare IM Given:    Additional Medicare Important Message give by:     If discussed at Long Length of Stay Meetings, dates discussed:    Additional Comments:  [name], Ralene Gasparyan Jane, RN 01/11/2015, 10:14 AM

## 2015-01-11 NOTE — Progress Notes (Addendum)
ANTIBIOTIC CONSULT NOTE - INITIAL  Pharmacy Consult for Vancomycin and zosyn Indication: HCAP  Allergies  Allergen Reactions  . Ketorolac Tromethamine     Hives (toradol)    Patient Measurements: Height: 5\' 8"  (172.7 cm) Weight: 175 lb 7.8 oz (79.6 kg) IBW/kg (Calculated) : 68.4   Vital Signs: Temp: 96.3 F (35.7 C) (05/18 2200) Temp Source: Rectal (05/18 2200) BP: 118/76 mmHg (05/19 0100) Pulse Rate: 96 (05/19 0210) Intake/Output from previous day: 05/18 0701 - 05/19 0700 In: 50 [IV Piggyback:50] Out: -  Intake/Output from this shift: Total I/O In: 50 [IV Piggyback:50] Out: -   Labs:  Recent Labs  01/10/15 2122 01/11/15 0155  WBC 17.9* 12.0*  HGB 16.1 14.3  PLT 160 129*  CREATININE 1.31* 1.21   Estimated Creatinine Clearance: 69.1 mL/min (by C-G formula based on Cr of 1.21). No results for input(s): VANCOTROUGH, VANCOPEAK, VANCORANDOM, GENTTROUGH, GENTPEAK, GENTRANDOM, TOBRATROUGH, TOBRAPEAK, TOBRARND, AMIKACINPEAK, AMIKACINTROU, AMIKACIN in the last 72 hours.   Microbiology: No results found for this or any previous visit (from the past 720 hour(s)).  Medical History: Past Medical History  Diagnosis Date  . Hypertension   . GERD (gastroesophageal reflux disease)   . Osteoporosis   . Migraine   . Chronic lower back pain   . Vitamin D deficiency   . Hepatomegaly   . Pancreatitis   . Diabetes mellitus   . Fracture acetabulum-closed 04/09/2013    Medications:  Prescriptions prior to admission  Medication Sig Dispense Refill Last Dose  . albuterol (PROVENTIL HFA;VENTOLIN HFA) 108 (90 BASE) MCG/ACT inhaler Inhale 1 puff into the lungs every 6 (six) hours as needed for wheezing or shortness of breath.   01/09/2015 at Unknown time  . baclofen (LIORESAL) 10 MG tablet Take 10 mg by mouth 3 (three) times daily.   01/10/2015 at Unknown time  . Calcium Carb-Cholecalciferol (CALCIUM-VITAMIN D) 600-400 MG-UNIT TABS Take 1 tablet by mouth daily.   Past Week at  Unknown time  . cyanocobalamin 1000 MCG tablet Take 100 mcg by mouth daily.   01/10/2015 at Unknown time  . ergocalciferol (VITAMIN D2) 50000 UNITS capsule Take 50,000 Units by mouth once a week. On Saturday   Past Week at Unknown time  . fluticasone (VERAMYST) 27.5 MCG/SPRAY nasal spray Place 2 sprays into the nose daily.   Past Week at Unknown time  . gabapentin (NEURONTIN) 400 MG capsule Take 1,200 mg by mouth 3 (three) times daily.   01/10/2015 at Unknown time  . hydrochlorothiazide (MICROZIDE) 12.5 MG capsule Take 12.5 mg by mouth daily.   01/10/2015 at Unknown time  . insulin aspart (NOVOLOG FLEXPEN) 100 UNIT/ML SOPN FlexPen Inject 7 Units into the skin 3 (three) times daily with meals. 9 mL 6 01/10/2015 at Unknown time  . Insulin Glargine (LANTUS SOLOSTAR) 100 UNIT/ML SOPN Inject 30 Units into the skin daily. 9 mL 6 01/10/2015 at Unknown time  . MORPHINE SULFATE PO Take 1 tablet by mouth 3 (three) times daily. 15mg  tablet. Take one tablet tid a day per patient   01/10/2015 at Unknown time  . oxyCODONE (OXY IR/ROXICODONE) 5 MG immediate release tablet Take 10 mg by mouth every 4 (four) hours as needed for pain.   01/10/2015 at Unknown time  . pantoprazole (PROTONIX) 20 MG tablet Take 20 mg by mouth daily.   01/10/2015 at Unknown time  . ramipril (ALTACE) 5 MG capsule Take 5 mg by mouth daily.   01/10/2015 at Unknown time  . sertraline (ZOLOFT) 100 MG  tablet Take 100 mg by mouth daily.    01/10/2015 at Unknown time  . ramipril (ALTACE) 10 MG capsule Take 1 capsule (10 mg total) by mouth daily. (Patient not taking: Reported on 01/10/2015) 30 capsule 11 Not Taking at Unknown time   Scheduled:  . heparin  5,000 Units Subcutaneous 3 times per day  . insulin aspart  0-15 Units Subcutaneous 6 times per day  . pantoprazole (PROTONIX) IV  40 mg Intravenous Daily  . piperacillin-tazobactam (ZOSYN)  IV  3.375 g Intravenous Once  . piperacillin-tazobactam (ZOSYN)  IV  3.375 g Intravenous 3 times per day  .  potassium chloride  10 mEq Intravenous Q1 Hr x 4  . vancomycin  1,000 mg Intravenous Once  . vancomycin  1,000 mg Intravenous Q12H   Infusions:  . sodium chloride 125 mL/hr at 01/11/15 0154   Assessment: 54 y.o male presented to ED 5/18 PM with chief complaint of SOB and cough. Productive cough, fever, and generalized weakness x 4 - 5 days. Pharmacy consulted to dose IV vancomycin and zosyn for HCAP. SCr =1.21.  Estimated CrCl ~ 69 ml/min  Former smoker  BCx2 5/18 > U.Legionella 5/18 > U.Strep 5.18 > Sputum Cx 5/18 >  Goal of Therapy:  Vancomycin trough level 15-20 mcg/ml  Plan:  Zosyn 3.375 gm IV q8h (4hr infusion) Vancomycin 1000 mg IV q12h Monitor clinical status, renal function, culture results daily.  Thank you for allowing pharmacy to be part of this patients care team. Noah Delaineuth Braelen Sproule, RPh Clinical Pharmacist Pager: [phone] 01/11/2015,3:08 AM   ADDENDUM:  Dr. Sung AmabileSimonds ordered to DC Zosyn and pharmacy consult for IV levofloxacin for CAP Azithromycin was given @ 23:57 on 01/10/15. These drugs should not be administered together due to duplication of coverage and risk of QT interval prolongation.   If antibiotic therapy is changing, advance the start time of the active antibiotic to 24 hours after the previous administered dose.  Azithromycin was given on 01/10/15 @ 23:57. Will start Levofloxacin 24hr later, Levofloxacin 750mg  IV q24h start on 5/19 @ 23:57 (~ 00:00 on 5/20)  Noah Delaineuth Jaycen Vercher, RPh Clinical Pharmacist Pager: [phone] 01/11/2015 6:40 AM

## 2015-01-12 ENCOUNTER — Inpatient Hospital Stay (HOSPITAL_COMMUNITY): Payer: Medicare Other

## 2015-01-12 ENCOUNTER — Telehealth: Payer: Self-pay

## 2015-01-12 DIAGNOSIS — A481 Legionnaires' disease: Principal | ICD-10-CM

## 2015-01-12 LAB — HEPATIC FUNCTION PANEL
ALK PHOS: 270 U/L — AB (ref 38–126)
ALT: 62 U/L (ref 17–63)
AST: 218 U/L — ABNORMAL HIGH (ref 15–41)
Albumin: 1.9 g/dL — ABNORMAL LOW (ref 3.5–5.0)
BILIRUBIN TOTAL: 3.2 mg/dL — AB (ref 0.3–1.2)
Bilirubin, Direct: 2.3 mg/dL — ABNORMAL HIGH (ref 0.1–0.5)
Indirect Bilirubin: 0.9 mg/dL (ref 0.3–0.9)
Total Protein: 5.1 g/dL — ABNORMAL LOW (ref 6.5–8.1)

## 2015-01-12 LAB — BLOOD GAS, ARTERIAL
ACID-BASE DEFICIT: 3 mmol/L — AB (ref 0.0–2.0)
BICARBONATE: 21.9 meq/L (ref 20.0–24.0)
Drawn by: 398991
FIO2: 0.6 %
MECHVT: 480 mL
O2 SAT: 94.2 %
PATIENT TEMPERATURE: 99.3
PEEP: 14 cmH2O
RATE: 30 resp/min
TCO2: 23.1 mmol/L (ref 0–100)
pCO2 arterial: 42.2 mmHg (ref 35.0–45.0)
pH, Arterial: 7.336 — ABNORMAL LOW (ref 7.350–7.450)
pO2, Arterial: 77.2 mmHg — ABNORMAL LOW (ref 80.0–100.0)

## 2015-01-12 LAB — CBC
HCT: 42.9 % (ref 39.0–52.0)
HEMOGLOBIN: 14.6 g/dL (ref 13.0–17.0)
MCH: 32.3 pg (ref 26.0–34.0)
MCHC: 34 g/dL (ref 30.0–36.0)
MCV: 94.9 fL (ref 78.0–100.0)
Platelets: 135 10*3/uL — ABNORMAL LOW (ref 150–400)
RBC: 4.52 MIL/uL (ref 4.22–5.81)
RDW: 14.4 % (ref 11.5–15.5)
WBC: 14.2 10*3/uL — AB (ref 4.0–10.5)

## 2015-01-12 LAB — GLUCOSE, CAPILLARY
GLUCOSE-CAPILLARY: 261 mg/dL — AB (ref 65–99)
Glucose-Capillary: 237 mg/dL — ABNORMAL HIGH (ref 65–99)
Glucose-Capillary: 270 mg/dL — ABNORMAL HIGH (ref 65–99)
Glucose-Capillary: 263 mg/dL — ABNORMAL HIGH (ref 65–99)
Glucose-Capillary: 245 mg/dL — ABNORMAL HIGH (ref 65–99)

## 2015-01-12 LAB — POCT I-STAT 3, ART BLOOD GAS (G3+)
ACID-BASE DEFICIT: 5 mmol/L — AB (ref 0.0–2.0)
ACID-BASE DEFICIT: 6 mmol/L — AB (ref 0.0–2.0)
ACID-BASE DEFICIT: 6 mmol/L — AB (ref 0.0–2.0)
Acid-base deficit: 5 mmol/L — ABNORMAL HIGH (ref 0.0–2.0)
Acid-base deficit: 6 mmol/L — ABNORMAL HIGH (ref 0.0–2.0)
BICARBONATE: 20.9 meq/L (ref 20.0–24.0)
Bicarbonate: 20.6 mEq/L (ref 20.0–24.0)
Bicarbonate: 21.8 mEq/L (ref 20.0–24.0)
Bicarbonate: 22.3 mEq/L (ref 20.0–24.0)
Bicarbonate: 22.6 mEq/L (ref 20.0–24.0)
O2 SAT: 94 %
O2 SAT: 98 %
O2 Saturation: 100 %
O2 Saturation: 83 %
O2 Saturation: 95 %
PCO2 ART: 48.4 mmHg — AB (ref 35.0–45.0)
PH ART: 7.239 — AB (ref 7.350–7.450)
PH ART: 7.277 — AB (ref 7.350–7.450)
PO2 ART: 240 mmHg — AB (ref 80.0–100.0)
PO2 ART: 78 mmHg — AB (ref 80.0–100.0)
Patient temperature: 100
Patient temperature: 98.6
Patient temperature: 98.8
Patient temperature: 98.9
Patient temperature: 99.5
TCO2: 22 mmol/L (ref 0–100)
TCO2: 22 mmol/L (ref 0–100)
TCO2: 23 mmol/L (ref 0–100)
TCO2: 24 mmol/L (ref 0–100)
TCO2: 24 mmol/L (ref 0–100)
pCO2 arterial: 42.2 mmHg (ref 35.0–45.0)
pCO2 arterial: 43.9 mmHg (ref 35.0–45.0)
pCO2 arterial: 50.1 mmHg — ABNORMAL HIGH (ref 35.0–45.0)
pCO2 arterial: 51.4 mmHg — ABNORMAL HIGH (ref 35.0–45.0)
pH, Arterial: 7.296 — ABNORMAL LOW (ref 7.350–7.450)
pH, Arterial: 7.286 — ABNORMAL LOW (ref 7.350–7.450)
pH, Arterial: 7.26 — ABNORMAL LOW (ref 7.350–7.450)
pO2, Arterial: 120 mmHg — ABNORMAL HIGH (ref 80.0–100.0)
pO2, Arterial: 57 mmHg — ABNORMAL LOW (ref 80.0–100.0)
pO2, Arterial: 87 mmHg (ref 80.0–100.0)

## 2015-01-12 LAB — BASIC METABOLIC PANEL
Anion gap: 5 (ref 5–15)
BUN: 28 mg/dL — ABNORMAL HIGH (ref 6–20)
CHLORIDE: 101 mmol/L (ref 101–111)
CO2: 24 mmol/L (ref 22–32)
Calcium: 7.8 mg/dL — ABNORMAL LOW (ref 8.9–10.3)
Creatinine, Ser: 0.83 mg/dL (ref 0.61–1.24)
GFR calc non Af Amer: >60 mL/min (ref >60)
Glucose, Bld: 282 mg/dL — ABNORMAL HIGH (ref 65–99)
POTASSIUM: 3.4 mmol/L — AB (ref 3.5–5.1)
Sodium: 130 mmol/L — ABNORMAL LOW (ref 135–145)

## 2015-01-12 LAB — CALCIUM, IONIZED: Calcium, Ionized, Serum: 4 mg/dL — ABNORMAL LOW (ref 4.5–5.6)

## 2015-01-12 LAB — PHOSPHORUS: Phosphorus: 1.1 mg/dL — ABNORMAL LOW (ref 2.5–4.6)

## 2015-01-12 LAB — LEGIONELLA ANTIGEN, URINE

## 2015-01-12 LAB — MAGNESIUM: Magnesium: 2.1 mg/dL (ref 1.7–2.4)

## 2015-01-12 MED ORDER — ALBUTEROL SULFATE (2.5 MG/3ML) 0.083% IN NEBU: 2.5000 mg | INHALATION_SOLUTION | RESPIRATORY_TRACT | Status: DC

## 2015-01-12 MED ORDER — INSULIN ASPART 100 UNIT/ML ~~LOC~~ SOLN
4.0000 [IU] | SUBCUTANEOUS | Status: DC
  Administered 2015-01-12 – 2015-01-21 (×44): 4 [IU] via SUBCUTANEOUS

## 2015-01-12 MED ORDER — IPRATROPIUM-ALBUTEROL 0.5-2.5 (3) MG/3ML IN SOLN
3.0000 mL | RESPIRATORY_TRACT | Status: DC
  Administered 2015-01-12 – 2015-01-13 (×3): 3 mL via RESPIRATORY_TRACT
  Filled 2015-01-12 (×3): qty 3

## 2015-01-12 MED ORDER — POTASSIUM CHLORIDE 10 MEQ/50ML IV SOLN
10.0000 meq | INTRAVENOUS | Status: AC
  Administered 2015-01-12 (×2): 10 meq via INTRAVENOUS
  Filled 2015-01-12 (×2): qty 50

## 2015-01-12 NOTE — Progress Notes (Signed)
[facility] FOR [facility] LAB REPLACEMENT ONLY  The [facility] does [facility] Protocol based on the criteria listed below:   1. Is GFR >/= 40 ml/min? Yes.    Patient's GFR today is >60 2. Is urine output >/= 0.5 ml/kg/hr for the last 6 hours? Yes.   Patient's UOP is 0.61 ml/kg/hr 3. Is BUN < 60 mg/dL? Yes.    Patient's BUN today is 28 4. Abnormal electrolyte(s):Potassium 5. Ordered repletion with: Potassium per Protocol   Grayer Sproles P 01/12/2015 5:07 AM

## 2015-01-12 NOTE — Telephone Encounter (Signed)
Spoke with [name] with [facility] Critical lab - Legionella Culture came back positive.  [facility]. Should be available to view this afternoon.  Will send to MR to address as he rounded on patient in Hospital. Pt is not yet established in office, has only been seen in ED.  Dr Marchelle Gearingamaswamy please advise. Thanks.

## 2015-01-12 NOTE — Telephone Encounter (Signed)
Pleas page [facility] with results STAT. I saw patient from [facility].

## 2015-01-12 NOTE — Progress Notes (Signed)
PULMONARY / CRITICAL CARE MEDICINE   Name: [name] MRN: [id number] DOB: 01-17-1961    ADMISSION DATE:  01/10/2015 CONSULTATION DATE:  01/12/2015  REFERRING MD :  EDP  CHIEF COMPLAINT:  SOB, cough  INITIAL PRESENTATION:   54 y.o. M presented to [facility] ED late 5/18 PM with acute hypoxic respiratory failure and severe bilateral infiltrates. Required intubation in AM 5/19. Treated as PNA, ARDS.   MAJOR EVENTS/TEST RESULTS: 5/18 Admit via ED to ICU/PCCM service 5/19 AM: intubated. ARDS protocol 5/19 RUQ [place] (elevated bilirubin): s/p chole, nothing acute  INDWELLING DEVICES:: ETT 5/19 >>  R IJ CVL 5/19 >>  A-line (ordered) 5/19 >>   MICRO DATA: MRSA PCR 5/18 >> NEG Blood 5/18 >>  Strep Ag 5/19 >>  Legionella Ag 5/19 >> POSITIVE Resp 5/19 >>   Flu panel (trach asp) 5/19 >>  RVP (trach asp) 5/19 >>  Legionella cx (trach asp) 5/19 >>   BAL 5/19 >> RVP 5/19 >>  ANTIMICROBIALS:  Pip-tazo 5/18 X 1 Vanc 5/18 >> 5/20 Levofloxacin 5/19 >>    SUBJECTIVE: Started on Nimbex, oxygenation improving, LEGIONELLA POSITIVE  VITAL SIGNS: Temp:  [98.1 F (36.7 C)-99.7 F (37.6 C)] 98.8 F (37.1 C) (05/20 1030) Pulse Rate:  [82-115] 115 (05/20 1030) Resp:  [17-30] 30 (05/20 1030) BP: (95-109)/(61-77) 101/63 mmHg (05/20 1145) SpO2:  [87 %-100 %] 87 % (05/20 1030) Arterial Line BP: (86-171)/(44-90) 171/90 mmHg (05/20 1030) FiO2 (%):  [60 %-100 %] 60 % (05/20 1145) Weight:  [84.4 kg (186 lb 1.1 oz)] 84.4 kg (186 lb 1.1 oz) (05/20 0500) HEMODYNAMICS:   VENTILATOR SETTINGS: Vent Mode:  [-] PRVC FiO2 (%):  [60 %-100 %] 60 % Set Rate:  [26 bmp-30 bmp] 30 bmp Vt Set:  [410 mL-480 mL] 480 mL PEEP:  [14 cmH20-16 cmH20] 14 cmH20 Plateau Pressure:  [28 cmH20-38 cmH20] 35 cmH20 INTAKE / OUTPUT: Intake/Output      05/19 0701 - 05/20 0700 05/20 0701 - 05/21 0700   I.V. (mL/kg) 2173.6 (25.8) 267.9 (3.2)   NG/GT 447.3 165   IV Piggyback 950    Total Intake(mL/kg) 3570.8 (42.3) 432.9  (5.1)   Urine (mL/kg/hr) 1405 (0.7) 260 (0.5)   Emesis/NG output  75 (0.2)   Stool 0 (0)    Total Output 1405 335   Net +2165.8 +97.9        Stool Occurrence 1 x      PHYSICAL EXAMINATION: General: sedated, paralyzed on vent HENT: NCAT ETT in place PULM: Crackles bilat, no asynchrony with vent CV: Tachy, regular, no mgr GI: BS+, soft, nontender MSK: normal bulk and tone Neuro: sedated, paralyzed on vent  LABS:  CBC  Recent Labs Lab 01/10/15 2122 01/11/15 0155 01/12/15 0300  WBC 17.9* 12.0* 14.2*  HGB 16.1 14.3 14.6  HCT 45.9 40.1 42.9  PLT 160 129* 135*   Coag's No results for input(s): APTT, INR in the last 168 hours. BMET  Recent Labs Lab 01/11/15 0155 01/11/15 1300 01/12/15 0300  NA 124* 133* 130*  K 2.6* 2.8* 3.4*  CL 86* 100* 101  CO2 _0 BUN 23* 24* 28*  CREATININE 1.21 0.89 0.83  GLUCOSE 365* 157* 282*   Electrolytes  Recent Labs Lab 01/11/15 0155 01/11/15 1300 01/12/15 0300  CALCIUM 7.1* 7.3* 7.8*  MG 1.6*  --  2.1  PHOS 2.8  --  1.1*   Sepsis Markers  Recent Labs Lab 01/11/15 0155 01/11/15 0428 01/11/15 1125  LATICACIDVEN 1.8 2.2*  1.8   ABG  Recent Labs Lab 01/12/15 0559 01/12/15 0850 01/12/15 1150  PHART 7.296* 7.286* 7.336*  PCO2ART 42.2 43.9 42.2  PO2ART 87.0 78.0* 77.2*   Liver Enzymes  Recent Labs Lab 01/10/15 2122 01/11/15 0155 01/12/15 0300  AST 315*  --  218*  ALT 62  --  62  ALKPHOS 214*  --  270*  BILITOT 3.2* 2.7* 3.2*  ALBUMIN 3.0*  --  1.9*   Cardiac Enzymes No results for input(s): TROPONINI, PROBNP in the last 168 hours. Glucose  Recent Labs Lab 01/11/15 1621 01/11/15 2025 01/11/15 2329 01/12/15 0334 01/12/15 0807 01/12/15 1207  GLUCAP 121* 137* 201* 261* 237* 270*    Imaging Dg Chest Port 1 View  01/11/2015   CLINICAL DATA:  Acute respiratory failure  EXAM: PORTABLE CHEST - 1 VIEW  COMPARISON:  01/11/2015 625 hours  FINDINGS: Cardiac shadow is stable. Diffuse bilateral  infiltrates are again identified. The endotracheal tube, nasogastric catheter and right subclavian central venous catheter are again noted and stable. No pneumothorax or sizable effusion is seen. Postsurgical changes are again noted.  IMPRESSION: No significant interval change from the prior study.   Electronically Signed   By: Inez Catalina M.D.   On: 01/11/2015 16:26   Dg Chest Port 1 View  01/11/2015   CLINICAL DATA:  Endotracheal tube placement. Respiratory distress, ARDS.  EXAM: PORTABLE CHEST - 1 VIEW  COMPARISON:  Yesterday at 2254 hour  FINDINGS: Endotracheal tube 2.9 cm from the carina. Enteric tube in place, tip and side port below the diaphragm in the stomach. Tip of the right central line in the mid SVC. No pneumothorax. Diffuse bilateral airspace opacification, progressed from prior exam. Cardiomediastinal contours are unchanged with borderline mild cardiomegaly. No large pleural effusion. Postsurgical change in the mid thoracic spine.  IMPRESSION: 1. Endotracheal tube, enteric tube, and right central line in place. No complication. 2. Worsening diffuse bilateral airspace disease, may reflect pulmonary edema, pneumonia, or ARDS.   Electronically Signed   By: Jeb Levering M.D.   On: 01/11/2015 06:40   US Abdomen Limited Ruq  01/11/2015   CLINICAL DATA:  Elevated bilirubin. Previous cholecystectomy. Ventilator dependent.  EXAM: US ABDOMEN LIMITED - RIGHT UPPER QUADRANT  COMPARISON:  Ultrasound the abdomen 10/02/2009  FINDINGS: Gallbladder:  Gallbladder is surgically absent.  Common bile duct:  Diameter: 4.2 mm  Liver:  Mildly dense echotexture without other features of fatty liver. No focal lesions are identified.  IMPRESSION: 1. Status post cholecystectomy. 2. No focal liver abnormalities. 3. Mildly echogenic renal parenchyma.   Electronically Signed   By: Nolon Nations M.D.   On: 01/11/2015 13:25    ASSESSMENT / PLAN:  PULMONARY A: Acute hypoxemic respiratory failure   Intubated  after failed NIPPV  ARDS > improving P:F ratio Legionella Pneumonia Former smoker P:   ARDS protocol Nimbex x 48 hours (started around 1600 on 5/19) Vent bundle Consider prone positioning if P:F worse  INFECTIOUS A:   Legionella PNA P:   Micro and abx as above (stop vanc)  CARDIOVASCULAR A:  Hx HTN P:  Holding outpatient HCTZ, ramipril MAP goal > 65 mmHg  RENAL A:   Hyponatremia > legionella related Hypokalemia AKI > resolved P:   Monitor BMET and UOP Replace electrolytes as needed KCl repleted 5/20  GASTROINTESTINAL A:   H/O GERD, chronic PPI Elevated AST and alk phos> likely related to legionella Hyperbilirubinemia> ultrasound unremarkable Hx pancreatitis P:   SUP: IV Pantoprazole Monitor LFTs intermittently  TF protocol 5/19  HEMATOLOGIC A:   Mild thrombocytopenia P:  DVT px: SQ heparin Monitor CBC intermittently Transfuse per usual ICU guidelines  ENDOCRINE A:   DM 2, poorly controlled P:   Lantus 15 units BID Mod scale SSI  NEUROLOGIC A:   Chronic lower back pain Vent/ICU associated discomfort P:   Holding outpatient oxycodone, sertraline, gabapentin. PAD protocol 5/19 - fent infusion, versed gtt NMB as above Continue baclofen to prevent withdrawal   Family updated: Wife 5/20, I updated her about legionella and called in an Rx for Levaquin  Interdisciplinary Family Meeting v Palliative Care Meeting:  Due by: 5/25  CC time:  50 minutes.  Roselie Awkward, MD [facility] Pager: [phone] Cell: [phone] If no response, call [phone]

## 2015-01-12 NOTE — Progress Notes (Signed)
Pt experiencing increased respiratory rate despite neuromuscular blockade; inspiratory and expiratory wheezes noted throughout; sats 80's; Dr Vassie LollAlva notified; see orders; will continue to monitor.  Burnard BuntingKatrice Creed Kail, RN

## 2015-01-12 NOTE — Progress Notes (Signed)
Results for Faythe GheeHARRISON, Wake P (MRN [id number]) as of 01/12/2015 10:06  Ref. Range 01/11/2015 16:21 01/11/2015 20:25 01/11/2015 23:29 01/12/2015 03:34 01/12/2015 08:07  Glucose-Capillary Latest Ref Range: 65-99 mg/dL 409121 (H) 811137 (H) 914201 (H) 261 (H) 237 (H)  CBGs continue to be greater than 180 mg/dl. On Vital continuous tube feedings. Continue Lantus and Novolog MODERATE correction scale every 4 hours.  Recommend adding Novolog 3 units every 4 hours as tube feed coverage if CBGs continue to be greater than 180 mg/dl. Smith MinceKendra Kateri Balch RN BSN CDE

## 2015-01-12 NOTE — Telephone Encounter (Signed)
[facility]05[facility].  Awaiting response.

## 2015-01-12 NOTE — Progress Notes (Signed)
Pt was on 100% due to earlier desat episode.  When attempted to place pt on previous setting, pt had desat.

## 2015-01-12 NOTE — Progress Notes (Signed)
eLink Physician-Brief Progress Note Patient Name: [name] DOB: 02/15/1961 MRN: [id number]   Date of Service  01/12/2015  HPI/Events of Note  Wheezing with desatn, high peak pr  eICU Interventions  Suction + neb Dropped RR to 25 to decrease autoPEEP Peak pr dropped to high 30s     Intervention Category Major Interventions: Hypoxemia - evaluation and management  Jeter Tomey V. 01/12/2015, 7:29 PM

## 2015-01-13 ENCOUNTER — Inpatient Hospital Stay (HOSPITAL_COMMUNITY): Payer: Medicare Other

## 2015-01-13 DIAGNOSIS — N179 Acute kidney failure, unspecified: Secondary | ICD-10-CM

## 2015-01-13 DIAGNOSIS — E876 Hypokalemia: Secondary | ICD-10-CM

## 2015-01-13 DIAGNOSIS — J9801 Acute bronchospasm: Secondary | ICD-10-CM

## 2015-01-13 DIAGNOSIS — J9601 Acute respiratory failure with hypoxia: Secondary | ICD-10-CM | POA: Insufficient documentation

## 2015-01-13 LAB — POCT I-STAT 3, ART BLOOD GAS (G3+)
Acid-base deficit: 5 mmol/L — ABNORMAL HIGH (ref 0.0–2.0)
Acid-base deficit: 7 mmol/L — ABNORMAL HIGH (ref 0.0–2.0)
Acid-base deficit: 7 mmol/L — ABNORMAL HIGH (ref 0.0–2.0)
Acid-base deficit: 7 mmol/L — ABNORMAL HIGH (ref 0.0–2.0)
BICARBONATE: 26.3 meq/L — AB (ref 20.0–24.0)
BICARBONATE: 22 meq/L (ref 20.0–24.0)
BICARBONATE: 21.2 meq/L (ref 20.0–24.0)
Bicarbonate: 19.7 mEq/L — ABNORMAL LOW (ref 20.0–24.0)
O2 SAT: 77 %
O2 SAT: 97 %
O2 SAT: 98 %
O2 Saturation: 73 %
PCO2 ART: 44.4 mmHg (ref 35.0–45.0)
PO2 ART: 57 mmHg — AB (ref 80.0–100.0)
PO2 ART: 54 mmHg — AB (ref 80.0–100.0)
PO2 ART: 122 mmHg — AB (ref 80.0–100.0)
Patient temperature: 99.2
Patient temperature: 99.5
TCO2: 21 mmol/L (ref 0–100)
TCO2: 22 mmol/L (ref 0–100)
TCO2: 24 mmol/L (ref 0–100)
TCO2: 29 mmol/L (ref 0–100)
pCO2 arterial: 94.4 mmHg (ref 35.0–45.0)
pCO2 arterial: 59.3 mmHg (ref 35.0–45.0)
pCO2 arterial: 42.3 mmHg (ref 35.0–45.0)
pH, Arterial: 7.054 — CL (ref 7.350–7.450)
pH, Arterial: 7.18 — CL (ref 7.350–7.450)
pH, Arterial: 7.261 — ABNORMAL LOW (ref 7.350–7.450)
pH, Arterial: 7.31 — ABNORMAL LOW (ref 7.350–7.450)
pO2, Arterial: 107 mmHg — ABNORMAL HIGH (ref 80.0–100.0)

## 2015-01-13 LAB — GLUCOSE, CAPILLARY
GLUCOSE-CAPILLARY: 214 mg/dL — AB (ref 65–99)
Glucose-Capillary: 149 mg/dL — ABNORMAL HIGH (ref 65–99)
Glucose-Capillary: 165 mg/dL — ABNORMAL HIGH (ref 65–99)
Glucose-Capillary: 207 mg/dL — ABNORMAL HIGH (ref 65–99)
Glucose-Capillary: 209 mg/dL — ABNORMAL HIGH (ref 65–99)
Glucose-Capillary: 267 mg/dL — ABNORMAL HIGH (ref 65–99)

## 2015-01-13 LAB — RESPIRATORY VIRUS PANEL
ADENOVIRUS: NEGATIVE
ADENOVIRUS: NEGATIVE
INFLUENZA A: NEGATIVE
INFLUENZA A: NEGATIVE
INFLUENZA B 1: NEGATIVE
Influenza B: NEGATIVE
METAPNEUMOVIRUS: NEGATIVE
Metapneumovirus: NEGATIVE
PARAINFLUENZA 2 A: NEGATIVE
Parainfluenza 1: NEGATIVE
Parainfluenza 1: NEGATIVE
Parainfluenza 2: NEGATIVE
Parainfluenza 3: NEGATIVE
Parainfluenza 3: NEGATIVE
RESPIRATORY SYNCYTIAL VIRUS B: NEGATIVE
RHINOVIRUS: NEGATIVE
Respiratory Syncytial Virus A: NEGATIVE
Respiratory Syncytial Virus A: NEGATIVE
Respiratory Syncytial Virus B: NEGATIVE
Rhinovirus: NEGATIVE

## 2015-01-13 LAB — HEPATIC FUNCTION PANEL
ALT: 59 U/L (ref 17–63)
AST: 147 U/L — ABNORMAL HIGH (ref 15–41)
Albumin: 1.7 g/dL — ABNORMAL LOW (ref 3.5–5.0)
Alkaline Phosphatase: 394 U/L — ABNORMAL HIGH (ref 38–126)
Bilirubin, Direct: 2.5 mg/dL — ABNORMAL HIGH (ref 0.1–0.5)
Indirect Bilirubin: 0.9 mg/dL (ref 0.3–0.9)
TOTAL PROTEIN: 5.4 g/dL — AB (ref 6.5–8.1)
Total Bilirubin: 3.4 mg/dL — ABNORMAL HIGH (ref 0.3–1.2)

## 2015-01-13 LAB — BLOOD GAS, ARTERIAL
ACID-BASE DEFICIT: 2.4 mmol/L — AB (ref 0.0–2.0)
BICARBONATE: 23.2 meq/L (ref 20.0–24.0)
DRAWN BY: 42624
FIO2: 1 %
MECHVT: 480 mL
O2 SAT: 96.9 %
PEEP: 16 cmH2O
PO2 ART: 106 mmHg — AB (ref 80.0–100.0)
Patient temperature: 98.6
RATE: 25 resp/min
TCO2: 24.8 mmol/L (ref 0–100)
pCO2 arterial: 50.3 mmHg — ABNORMAL HIGH (ref 35.0–45.0)
pH, Arterial: 7.287 — ABNORMAL LOW (ref 7.350–7.450)

## 2015-01-13 LAB — BASIC METABOLIC PANEL
ANION GAP: 4 — AB (ref 5–15)
BUN: 34 mg/dL — ABNORMAL HIGH (ref 6–20)
CO2: 27 mmol/L (ref 22–32)
Calcium: 8.1 mg/dL — ABNORMAL LOW (ref 8.9–10.3)
Chloride: 105 mmol/L (ref 101–111)
Creatinine, Ser: 1 mg/dL (ref 0.61–1.24)
GFR calc Af Amer: >60 mL/min (ref >60)
GLUCOSE: 223 mg/dL — AB (ref 65–99)
Potassium: 3.1 mmol/L — ABNORMAL LOW (ref 3.5–5.1)
SODIUM: 136 mmol/L (ref 135–145)

## 2015-01-13 LAB — CULTURE, RESPIRATORY: GRAM STAIN: NONE SEEN

## 2015-01-13 LAB — CBC WITH DIFFERENTIAL/PLATELET
BASOS ABS: 0 10*3/uL (ref 0.0–0.1)
Basophils Relative: 0 % (ref 0–1)
EOS ABS: 0 10*3/uL (ref 0.0–0.7)
EOS PCT: 0 % (ref 0–5)
HEMATOCRIT: 43.2 % (ref 39.0–52.0)
Hemoglobin: 14.3 g/dL (ref 13.0–17.0)
LYMPHS ABS: 2.1 10*3/uL (ref 0.7–4.0)
Lymphocytes Relative: 13 % (ref 12–46)
MCH: 32.4 pg (ref 26.0–34.0)
MCHC: 33.1 g/dL (ref 30.0–36.0)
MCV: 98 fL (ref 78.0–100.0)
Monocytes Absolute: 1.1 10*3/uL — ABNORMAL HIGH (ref 0.1–1.0)
Monocytes Relative: 7 % (ref 3–12)
Neutro Abs: 13 10*3/uL — ABNORMAL HIGH (ref 1.7–7.7)
Neutrophils Relative %: 80 % — ABNORMAL HIGH (ref 43–77)
PLATELETS: 134 10*3/uL — AB (ref 150–400)
RBC: 4.41 MIL/uL (ref 4.22–5.81)
RDW: 15.1 % (ref 11.5–15.5)
WBC: 16.2 10*3/uL — AB (ref 4.0–10.5)

## 2015-01-13 LAB — MAGNESIUM: MAGNESIUM: 2 mg/dL (ref 1.7–2.4)

## 2015-01-13 LAB — PHOSPHORUS: Phosphorus: 1.7 mg/dL — ABNORMAL LOW (ref 2.5–4.6)

## 2015-01-13 LAB — CULTURE, RESPIRATORY W GRAM STAIN: Culture: NO GROWTH

## 2015-01-13 MED ORDER — POTASSIUM CHLORIDE 10 MEQ/50ML IV SOLN
10.0000 meq | INTRAVENOUS | Status: AC
  Administered 2015-01-13 (×4): 10 meq via INTRAVENOUS
  Filled 2015-01-13 (×4): qty 50

## 2015-01-13 MED ORDER — ALBUTEROL SULFATE (2.5 MG/3ML) 0.083% IN NEBU
2.5000 mg | INHALATION_SOLUTION | Freq: Four times a day (QID) | RESPIRATORY_TRACT | Status: DC
  Administered 2015-01-13 – 2015-01-15 (×8): 2.5 mg via RESPIRATORY_TRACT
  Filled 2015-01-13 (×8): qty 3

## 2015-01-13 MED ORDER — BUDESONIDE 0.25 MG/2ML IN SUSP
0.2500 mg | Freq: Four times a day (QID) | RESPIRATORY_TRACT | Status: DC
  Administered 2015-01-13 – 2015-01-21 (×33): 0.25 mg via RESPIRATORY_TRACT
  Filled 2015-01-13 (×37): qty 2

## 2015-01-13 MED ORDER — ACETYLCYSTEINE 20 % IN SOLN
4.0000 mL | Freq: Four times a day (QID) | RESPIRATORY_TRACT | Status: DC
  Administered 2015-01-13 – 2015-01-15 (×10): 4 mL via RESPIRATORY_TRACT
  Filled 2015-01-13 (×14): qty 4

## 2015-01-13 MED ORDER — BUDESONIDE 0.25 MG/2ML IN SUSP
0.5000 mg | Freq: Two times a day (BID) | RESPIRATORY_TRACT | Status: DC
  Filled 2015-01-13 (×3): qty 4

## 2015-01-13 MED ORDER — BUDESONIDE 0.5 MG/2ML IN SUSP
0.5000 mg | Freq: Two times a day (BID) | RESPIRATORY_TRACT | Status: DC
  Administered 2015-01-13: 0.5 mg via RESPIRATORY_TRACT
  Filled 2015-01-13 (×3): qty 2

## 2015-01-13 MED ORDER — ALBUTEROL SULFATE (2.5 MG/3ML) 0.083% IN NEBU: 2.5000 mg | INHALATION_SOLUTION | RESPIRATORY_TRACT | Status: DC | PRN

## 2015-01-13 MED ORDER — POTASSIUM CHLORIDE 20 MEQ/15ML (10%) PO SOLN
40.0000 meq | Freq: Once | ORAL | Status: AC
  Administered 2015-01-13: 40 meq
  Filled 2015-01-13 (×2): qty 30

## 2015-01-13 NOTE — Progress Notes (Addendum)
Patient had a high peak pressure, desat, and increased CO2, briefly disconnected from vent and bagged.  Vent graph reviewed showing significant air trapping.  I decreased the Ti from 1.2 to 0.75 sec with much better expiratory time and Co2 coming down significantly. Still, the maximum Sat we were able to achieve was 84% on 100% FiO2.  CXR reviewed, basically the same as from yesterday with severe bilateral opacities. His BP is good thus I decided to prone him and almost immediately after proning (within 1 minute) his sat came up from 84% to 91%.  Will repeat ABG in 1 hour to see how his pH and CO2 levels are and if those are good then APRV can be an option in the future.  I also ordered Echo with bubble study to see if there's any right-to-left shunt that contribute to his hypoxia.

## 2015-01-13 NOTE — Progress Notes (Signed)
Pt had desat with sats maintaining 83% even with vent manipulations. Pt proned with head turned to the right. ETT stable and secure. Vent settings changed by Dr. Silverio Decamphristiano. Sats improved within 30 seconds of proning and maintaining around 91%. RT will continue to monitor and repeat ABG in 1 hour.

## 2015-01-13 NOTE — Progress Notes (Signed)
ANTIBIOTIC CONSULT NOTE  Pharmacy Consult for Levofloxacin Indication: HCAP  Allergies  Allergen Reactions  . Ketorolac Tromethamine     Hives (toradol)    Patient Measurements: Height:  (172.7 cm) Weight:  (not able to obtain, pt proned ) IBW/kg (Calculated) : 68.4   Vital Signs: Temp: 99.8 F (37.7 C) (05/21 0700) BP: 105/63 mmHg (05/21 0600) Pulse Rate: 113 (05/21 0700) Intake/Output from previous day: 05/20 0701 - 05/21 0700 In: 3491.6 [I.V.:1951.6; NG/GT:1340; IV Piggyback:200] Out: 1265 [Urine:1190; Emesis/NG output:75] Intake/Output from this shift:    Labs:  Recent Labs  01/11/15 0155 01/11/15 1300 01/12/15 0300 01/13/15 0420  WBC 12.0*  --  14.2* 16.2*  HGB 14.3  --  14.6 14.3  PLT 129*  --  135* 134*  CREATININE 1.21 0.89 0.83 1.00   Estimated Creatinine Clearance: 91.4 mL/min (by C-G formula based on Cr of 1). No results for input(s): VANCOTROUGH, VANCOPEAK, VANCORANDOM, GENTTROUGH, GENTPEAK, GENTRANDOM, TOBRATROUGH, TOBRAPEAK, TOBRARND, AMIKACINPEAK, AMIKACINTROU, AMIKACIN in the last 72 hours.   Microbiology: Recent Results (from the past 720 hour(s))  Blood Culture (routine x 2)     Status: None (Preliminary result)   Collection Time: 01/10/15 11:29 PM  Result Value Ref Range Status   Specimen Description BLOOD LEFT HAND  Final   Special Requests BOTTLES DRAWN AEROBIC AND ANAEROBIC 5CC  Final   Culture   Final           BLOOD CULTURE RECEIVED NO GROWTH TO DATE CULTURE WILL BE HELD FOR 5 DAYS BEFORE ISSUING A FINAL NEGATIVE REPORT Performed at [facility]    Report Status PENDING  Incomplete  Blood Culture (routine x 2)     Status: None (Preliminary result)   Collection Time: 01/10/15 11:30 PM  Result Value Ref Range Status   Specimen Description BLOOD RIGHT HAND  Final   Special Requests BOTTLES DRAWN AEROBIC AND ANAEROBIC 5CC  Final   Culture   Final           BLOOD CULTURE RECEIVED NO GROWTH TO DATE CULTURE WILL BE HELD FOR  5 DAYS BEFORE ISSUING A FINAL NEGATIVE REPORT Performed at [facility]    Report Status PENDING  Incomplete  MRSA PCR Screening     Status: None   Collection Time: 01/11/15  1:40 AM  Result Value Ref Range Status   MRSA by PCR NEGATIVE NEGATIVE Final    Comment:        The GeneXpert MRSA Assay (FDA approved for NASAL specimens only), is one component of a comprehensive MRSA colonization surveillance program. It is not intended to diagnose MRSA infection nor to guide or monitor treatment for MRSA infections.   Culture, respiratory (NON-Expectorated)     Status: None (Preliminary result)   Collection Time: 01/11/15  7:00 AM  Result Value Ref Range Status   Specimen Description TRACHEAL ASPIRATE  Final   Special Requests NONE  Final   Gram Stain   Final    NO WBC SEEN NO SQUAMOUS EPITHELIAL CELLS SEEN NO ORGANISMS SEEN Performed at [facility]    Culture   Final    NO GROWTH 1 DAY Performed at [facility]    Report Status PENDING  Incomplete  Respiratory virus panel     Status: None   Collection Time: 01/11/15  7:00 AM  Result Value Ref Range Status   Source - RVPAN TRACHEAL ASPIRATE  Corrected   Respiratory Syncytial Virus A Negative Negative Final   Respiratory  Syncytial Virus B Negative Negative Final   Influenza A Negative Negative Final   Influenza B Negative Negative Final   Parainfluenza 1 Negative Negative Final   Parainfluenza 2 Negative Negative Final   Parainfluenza 3 Negative Negative Final   Metapneumovirus Negative Negative Final   Rhinovirus Negative Negative Final   Adenovirus Negative Negative Final    Comment: (NOTE) Performed At: [facility] [address] [place], [place] [id number] Mila HomerHancock William F MD WU:[id number]:[phone]   Respiratory virus panel     Status: None   Collection Time: 01/11/15  9:54 AM  Result Value Ref Range Status   Respiratory Syncytial Virus A Negative Negative Final   Respiratory Syncytial  Virus B Negative Negative Final   Influenza A Negative Negative Final   Influenza B Negative Negative Final   Parainfluenza 1 Negative Negative Final   Parainfluenza 2 Negative Negative Final   Parainfluenza 3 Negative Negative Final   Metapneumovirus Negative Negative Final   Rhinovirus Negative Negative Final   Adenovirus Negative Negative Final    Comment: (NOTE) Performed At: [facility] [address] [place], [place] [id number] Mila HomerHancock William F MD VH:[id number]:[phone]   Culture, respiratory (NON-Expectorated)     Status: None (Preliminary result)   Collection Time: 01/11/15 12:30 PM  Result Value Ref Range Status   Specimen Description BRONCHIAL ALVEOLAR LAVAGE  Final   Special Requests Normal  Final   Gram Stain   Final    RARE WBC PRESENT, PREDOMINANTLY PMN NO SQUAMOUS EPITHELIAL CELLS SEEN NO ORGANISMS SEEN [facility]formed at [facility]    Culture   Final    Culture reincubated for better growth Performed at [facility]    Report Status PENDING  Incomplete   Assessment: 54 y.o male presented to ED 5/18 PM with chief complaint of SOB and cough + productive cough, fever, and generalized weakness x 4 - 5 days. Found to be positive for legionella antigen. WBC 16.2, afebrile. SCr 1 with est CrCl ~90-14200mL/min.  Remains ventilated on ARDS protocol. Nimbex stops this afternoon.  Goal of Therapy:  Proper dosing based on hepatic and renal function  Plan:  -Levofloxacin 750mg  IV q24h -follow clinical progression, renal function, LOT   [name], PharmD, BCPS Clinical Pharmacist Pager: [phone] 01/13/2015 11:32 AM

## 2015-01-13 NOTE — Progress Notes (Signed)
[facility]OCOL FOR AM LAB REPLACEMENT ONLY  The patient does apply for the [facility] Electrolyte Replac[facility] Protocol based on the criteria listed below:   1. Is GFR >/= 40 ml/min? Yes.    Patient's GFR today is >60 2. Is urine output >/= 0.5 ml/kg/hr for the last 6 hours? Yes.   Patient's UOP is 1.0 ml/kg/hr 3. Is BUN < 60 mg/dL? Yes.    Patient's BUN today is 34 4. Abnormal electrolyte(s): Potassium 5. Ordered repletion with: Potassium per protocol    Katianna Mcclenney P 01/13/2015 5:56 AM

## 2015-01-13 NOTE — Progress Notes (Addendum)
PULMONARY / CRITICAL CARE MEDICINE   Name: [name] MRN: [id number] DOB: [birth year]    ADMISSION DATE:  01/10/2015 CONSULTATION DATE:  01/13/2015  REFERRING MD :  EDP  CHIEF COMPLAINT:  SOB, cough  INITIAL PRESENTATION:   54 y.o. M presented to [facility] ED late 5/18 PM with acute hypoxic respiratory failure and severe bilateral infiltrates. Required intubation in AM 5/19. Treated as PNA, ARDS.   MAJOR EVENTS/TEST RESULTS: 5/18 Admit via ED to ICU/PCCM service 5/19 AM: intubated. ARDS protocol 5/19 RUQ [place] (elevated bilirubin): s/p chole, nothing acute 5/20 worsening gas exchange. Started on Nimbex. Prone ventilation. Vasopressors initiated 5/21 Off vasopressors./ Severe gas trapping. Very high PIPs. Quick look FOB > extensive thick secretions in proximal airway and partially obstructing tip of ETT. Secretions successfully suctioned and removed. PIPs markedly improved. Pt left in supine position after FOB. ARDS protocol. Vt reduced to 6 cc/kg IBW. Gas exchange much improved  INDWELLING DEVICES:: ETT 5/19 >>  R IJ CVL 5/19 >>  R radial A-line 5/19 >>   MICRO DATA: MRSA PCR 5/18 >> NEG Blood 5/18 >> NEG Strep Ag 5/19 >> NEG Legionella Ag 5/19 >> POSITIVE BAL 5/19 >>  Flu panel (trach asp) 5/19 >> NEG RVP (trach asp) 5/19 >> NEG Legionella cx (trach asp) 5/19 >>     ANTIMICROBIALS:  Pip-tazo 5/18 X 1 Vanc 5/18 >> 5/20 Levofloxacin 5/19 >>     SUBJECTIVE:  Sedated, paralyzed  VITAL SIGNS: Temp:  [98.9 F (37.2 C)-100 F (37.8 C)] 99.8 F (37.7 C) (05/21 0700) Pulse Rate:  [99-122] 113 (05/21 0700) Resp:  [16-34] 24 (05/21 0700) BP: (99-115)/(63-77) 105/63 mmHg (05/21 0600) SpO2:  [84 %-99 %] 99 % (05/21 1022) Arterial Line BP: (102-132)/(59-70) 118/64 mmHg (05/21 0700) FiO2 (%):  [60 %-100 %] 100 % (05/21 1022) HEMODYNAMICS:   VENTILATOR SETTINGS: Vent Mode:  [-] PRVC FiO2 (%):  [60 %-100 %] 100 % Set Rate:  [25 bmp-35 bmp] 35 bmp Vt Set:  [410 mL-480 mL] 410  mL PEEP:  [12 cmH20-20 cmH20] 20 cmH20 Plateau Pressure:  [29 cmH20-35 cmH20] 35 cmH20 INTAKE / OUTPUT: Intake/Output      05/20 0701 - 05/21 0700 05/21 0701 - 05/22 0700   I.V. (mL/kg) 1951.6 (23.1)    NG/GT 1340    IV Piggyback 200    Total Intake(mL/kg) 3491.6 (41.4)    Urine (mL/kg/hr) 1190 (0.6)    Emesis/NG output 75 (0)    Stool     Total Output 1265     Net +2226.6            PHYSICAL EXAMINATION: General: sedated, paralyzed on vent HENT: NCAT ETT in place PULM: Crackles bilat, no asynchrony with vent CV: Regular, no M GI: BS+, soft, nontender Ext: trace symmetric edema Neuro: sedated, paralyzed on vent  LABS:  CBC  Recent Labs Lab 01/11/15 0155 01/12/15 0300 01/13/15 0420  WBC 12.0* 14.2* 16.2*  HGB 14.3 14.6 14.3  HCT 40.1 42.9 43.2  PLT 129* 135* 134*   Coag's No results for input(s): APTT, INR in the last 168 hours. BMET  Recent Labs Lab 01/11/15 1300 01/12/15 0300 01/13/15 0420  NA 133* 130* 136  K 2.8* 3.4* 3.1*  CL 100* 101 105  CO2 _0 BUN 24* 28* 34*  CREATININE 0.89 0.83 1.00  GLUCOSE 157* 282* 223*   Electrolytes  Recent Labs Lab 01/11/15 0155 01/11/15 1300 01/12/15 0300 01/13/15 0420  CALCIUM 7.1* 7.3* 7.8* 8.1*  MG 1.6*  --  2.1 2.0  PHOS 2.8  --  1.1* 1.7*   Sepsis Markers  Recent Labs Lab 01/11/15 0155 01/11/15 0428 01/11/15 1125  LATICACIDVEN 1.8 2.2* 1.8   ABG  Recent Labs Lab 01/13/15 0509 01/13/15 0630 01/13/15 1014  PHART 7.180* 7.287* 7.310*  PCO2ART 59.3* 50.3* 42.3  PO2ART 54.0* 106* 122.0*   Liver Enzymes  Recent Labs Lab 01/10/15 2122 01/11/15 0155 01/12/15 0300 01/13/15 0420  AST 315*  --  218* 147*  ALT 62  --  62 59  ALKPHOS 214*  --  270* 394*  BILITOT 3.2* 2.7* 3.2* 3.4*  ALBUMIN 3.0*  --  1.9* 1.7*   Cardiac Enzymes No results for input(s): TROPONINI, PROBNP in the last 168 hours. Glucose  Recent Labs Lab 01/12/15 1207 01/12/15 1539 01/12/15 2045 01/12/15 2337  01/13/15 0343 01/13/15 0759  GLUCAP 270* 263* 245* 207* 209* 149*    CXR: Severe ARDS pattern   ASSESSMENT / PLAN:  PULMONARY A: Acute hypoxemic respiratory failure   ARDS Legionella Pneumonia Former smoker Airflow obstruction Mucus plugging P:   Cont ARDS protocol Cont Nimbex  Cont Vent bundle Add nebulized steroids 5/21 Add nebulized bronchodilators 5/21 Nebulized NAC 5/21  INFECTIOUS A:   Legionella PNA P:   Micro and abx as above (stop vanc)  CARDIOVASCULAR A:  Hx HTN P:  Holding outpatient HCTZ, ramipril MAP goal > 65 mmHg Resume NE as needed  RENAL A:   Hyponatremia, resolved Hypokalemia AKI, nonoliguric P:   Monitor BMET intermittently Monitor I/Os Correct electrolytes as indicated  GASTROINTESTINAL A:   H/O GERD, chronic PPI Elevated AST and alk phos> likely related to legionella Hyperbilirubinemia> ultrasound unremarkable Hx pancreatitis P:   SUP: IV Pantoprazole Monitor LFTs intermittently TF protocol 5/19  HEMATOLOGIC A:   Mild thrombocytopenia P:  DVT px: SQ heparin Monitor CBC intermittently Transfuse per usual ICU guidelines  ENDOCRINE A:   DM 2, now controlled P:   Cont Lantus  Cont Mod scale SSI  NEUROLOGIC A:   Chronic lower back pain Vent/ICU associated discomfort P:   Holding outpatient oxycodone, sertraline, gabapentin, Baclofen PAD protocol 5/19 - fent infusion, versed gtt Cont NMB as above Continue baclofen to prevent withdrawal   Family updated: o family @ bedside 5/21  Interdisciplinary Family Meeting v Palliative Care Meeting:  Due by: 5/25  CC time:  50 minutes.  Merton Border, MD ; [facility] [phone].  After 5:30 PM or weekends, call [phone]

## 2015-01-13 NOTE — Procedures (Signed)
BRONCHOSCOPY  INDICATION: very high PIPs, suspect mucus plugging  PROCEDURE: pt already heavily sedated and paralyzed FOB introduced via ETT Cursory exam of all first generation bronchi performed There was thick mucus adherent to the end of the ETT, partially obstructing There were multiple airways with thick mucopurulent secretions  All secretions were suctioned and successfully removed with marked improvement in airway mechanics No specimens were sent  Billy Fischeravid Noralyn Karim, MD ; [facility] service Mobile [phone].  After 5:30 PM or weekends, call [phone]

## 2015-01-13 NOTE — Progress Notes (Signed)
Pt placed back supine from prone position per MD.  Pt was then bronched due to possible mucus plugs occluding ETT.  Thick mucus plugs were obtained and removed.  Pt left supine and vent changes made by MD.  ABG in 1 hour. RT will continue to monitor.

## 2015-01-13 NOTE — Progress Notes (Signed)
Pt began to desat around 0400. Pt was not maintaining volumes and peak pressures were elevated. Despite suctioning and lavaging pt. Pt continued to desat and clinically worsen. CXR was completed and revealed severe ARDS as previous CXR's had shown. MD Cleatis PolkaMario Christiano ordered to prone patient but proning bed was not available. Therefore, pt had to be prone due to worsening and having a declining respiratory status. Pt was proned in regular ICU bed around 0520. Pt eyes were covered with gauze. Pt's head was placed upon a foam pillow to the side. Pt's bony prominences on all 4 extremities were placed on pillows to relieve any pressure and to prevent skin breakdown. Proning bed is being [facility]. After proning, pt's respiratory's status drastically improved. Pt is currently saturating at 95-97% approximately.

## 2015-01-14 ENCOUNTER — Inpatient Hospital Stay (HOSPITAL_COMMUNITY): Payer: Medicare Other

## 2015-01-14 DIAGNOSIS — E46 Unspecified protein-calorie malnutrition: Secondary | ICD-10-CM

## 2015-01-14 LAB — CBC
HCT: 39.3 % (ref 39.0–52.0)
Hemoglobin: 13.3 g/dL (ref 13.0–17.0)
MCH: 32.8 pg (ref 26.0–34.0)
MCHC: 33.8 g/dL (ref 30.0–36.0)
MCV: 97 fL (ref 78.0–100.0)
Platelets: 130 10*3/uL — ABNORMAL LOW (ref 150–400)
RBC: 4.05 MIL/uL — ABNORMAL LOW (ref 4.22–5.81)
RDW: 15.3 % (ref 11.5–15.5)
WBC: 12.3 10*3/uL — ABNORMAL HIGH (ref 4.0–10.5)

## 2015-01-14 LAB — POCT I-STAT 3, ART BLOOD GAS (G3+)
ACID-BASE DEFICIT: 7 mmol/L — AB (ref 0.0–2.0)
Acid-base deficit: 7 mmol/L — ABNORMAL HIGH (ref 0.0–2.0)
Acid-base deficit: 5 mmol/L — ABNORMAL HIGH (ref 0.0–2.0)
BICARBONATE: 19.7 meq/L — AB (ref 20.0–24.0)
Bicarbonate: 20.7 mEq/L (ref 20.0–24.0)
Bicarbonate: 21.2 mEq/L (ref 20.0–24.0)
O2 Saturation: 82 %
O2 Saturation: 94 %
O2 Saturation: 92 %
PCO2 ART: 47.8 mmHg — AB (ref 35.0–45.0)
PH ART: 7.304 — AB (ref 7.350–7.450)
PO2 ART: 56 mmHg — AB (ref 80.0–100.0)
PO2 ART: 83 mmHg (ref 80.0–100.0)
PO2 ART: 75 mmHg — AB (ref 80.0–100.0)
Patient temperature: 99.3
TCO2: 22 mmol/L (ref 0–100)
TCO2: 21 mmol/L (ref 0–100)
TCO2: 22 mmol/L (ref 0–100)
pCO2 arterial: 43.3 mmHg (ref 35.0–45.0)
pCO2 arterial: 43.2 mmHg (ref 35.0–45.0)
pH, Arterial: 7.247 — ABNORMAL LOW (ref 7.350–7.450)
pH, Arterial: 7.267 — ABNORMAL LOW (ref 7.350–7.450)

## 2015-01-14 LAB — HEPATIC FUNCTION PANEL
ALK PHOS: 444 U/L — AB (ref 38–126)
ALT: 54 U/L (ref 17–63)
AST: 138 U/L — ABNORMAL HIGH (ref 15–41)
Albumin: 1.6 g/dL — ABNORMAL LOW (ref 3.5–5.0)
Bilirubin, Direct: 2.2 mg/dL — ABNORMAL HIGH (ref 0.1–0.5)
Indirect Bilirubin: 1 mg/dL — ABNORMAL HIGH (ref 0.3–0.9)
Total Bilirubin: 3.2 mg/dL — ABNORMAL HIGH (ref 0.3–1.2)
Total Protein: 5.1 g/dL — ABNORMAL LOW (ref 6.5–8.1)

## 2015-01-14 LAB — GLUCOSE, CAPILLARY
GLUCOSE-CAPILLARY: 192 mg/dL — AB (ref 65–99)
Glucose-Capillary: 190 mg/dL — ABNORMAL HIGH (ref 65–99)
Glucose-Capillary: 201 mg/dL — ABNORMAL HIGH (ref 65–99)
Glucose-Capillary: 225 mg/dL — ABNORMAL HIGH (ref 65–99)
Glucose-Capillary: 243 mg/dL — ABNORMAL HIGH (ref 65–99)
Glucose-Capillary: 258 mg/dL — ABNORMAL HIGH (ref 65–99)

## 2015-01-14 LAB — CULTURE, RESPIRATORY: SPECIAL REQUESTS: NORMAL

## 2015-01-14 LAB — MAGNESIUM: Magnesium: 1.8 mg/dL (ref 1.7–2.4)

## 2015-01-14 LAB — BASIC METABOLIC PANEL
Anion gap: 8 (ref 5–15)
BUN: 44 mg/dL — AB (ref 6–20)
CO2: 23 mmol/L (ref 22–32)
Calcium: 8.6 mg/dL — ABNORMAL LOW (ref 8.9–10.3)
Chloride: 108 mmol/L (ref 101–111)
Creatinine, Ser: 1.15 mg/dL (ref 0.61–1.24)
GFR calc Af Amer: >60 mL/min (ref >60)
GFR calc non Af Amer: >60 mL/min (ref >60)
GLUCOSE: 192 mg/dL — AB (ref 65–99)
Potassium: 3.7 mmol/L (ref 3.5–5.1)
SODIUM: 139 mmol/L (ref 135–145)

## 2015-01-14 LAB — PHOSPHORUS
PHOSPHORUS: 1.1 mg/dL — AB (ref 2.5–4.6)
PHOSPHORUS: 2.6 mg/dL (ref 2.5–4.6)

## 2015-01-14 LAB — CULTURE, RESPIRATORY W GRAM STAIN

## 2015-01-14 MED ORDER — MAGNESIUM SULFATE 2 GM/50ML IV SOLN
2.0000 g | Freq: Once | INTRAVENOUS | Status: AC
  Administered 2015-01-14: 2 g via INTRAVENOUS
  Filled 2015-01-14: qty 50

## 2015-01-14 MED ORDER — SODIUM PHOSPHATE 3 MMOLE/ML IV SOLN
30.0000 mmol | Freq: Once | INTRAVENOUS | Status: AC
  Administered 2015-01-14: 30 mmol via INTRAVENOUS
  Filled 2015-01-14: qty 10

## 2015-01-14 MED ORDER — POTASSIUM CHLORIDE 20 MEQ/15ML (10%) PO SOLN
20.0000 meq | ORAL | Status: AC
  Administered 2015-01-14 (×2): 20 meq
  Filled 2015-01-14: qty 15

## 2015-01-14 NOTE — Progress Notes (Signed)
[facility]OC[facility] FOR AM LAB REPLACEMENT ONLY  The patient does [facility] based on the criteria listed below:   1. Is GFR >/= 40 ml/min? Yes.    Patient's GFR today is >60 2. Is urine output >/= 0.5 ml/kg/hr for the last 6 hours? Yes.   Patient's UOP is 0.6 ml/kg/hr 3. Is BUN < 60 mg/dL? Yes.    Patient's BUN today is 44 4. Abnormal electrolyte(s): K 3.7, M 1.8, P 1.1 5. Ordered repletion with: per protocol 6. If a panic level lab has been reported, has the CCM MD in charge been notified? Yes.  .   Physician:  Dr Albertina Parrmcquaid   [name] A 01/14/2015 4:43 AM

## 2015-01-14 NOTE — Progress Notes (Signed)
PULMONARY / CRITICAL CARE MEDICINE   Name: [name] MRN: [id number] DOB: 1961/07/06    ADMISSION DATE:  01/10/2015 CONSULTATION DATE:  01/14/2015  REFERRING MD :  EDP  CHIEF COMPLAINT:  SOB, cough  INITIAL PRESENTATION:   54 y.o. M presented to [facility] ED late 5/18 PM with acute hypoxic respiratory failure and severe bilateral infiltrates. Required intubation in AM 5/19. Treated as PNA, ARDS.   MAJOR EVENTS/TEST RESULTS: 5/18  Admit via ED to ICU/PCCM service 5/19  AM: intubated. ARDS protocol 5/19  RUQ [place] (elevated bilirubin): s/p chole, nothing acute 5/20  worsening gas exchange. Started on Nimbex. Prone ventilation. Vasopressors initiated 5/21  Off vasopressors./ Severe gas trapping. Very high PIPs. Quick look FOB > extensive thick secretions in proximal airway and partially obstructing tip of ETT. Secretions successfully suctioned and removed. PIPs markedly improved. Pt left in supine position after FOB. ARDS protocol. Vt reduced to 6 cc/kg IBW. Gas exchange much improved 5/22  Improved vent mechanics.  Nimbex, versed, fent.     INDWELLING DEVICES:: ETT 5/19 >>  R IJ CVL 5/19 >>  R radial A-line 5/19 >>   MICRO DATA: MRSA PCR 5/18 >> NEG Blood 5/18 >> NEG Strep Ag 5/19 >> NEG Legionella Ag 5/19 >> POSITIVE BAL 5/19 >>  Flu panel (trach asp) 5/19 >> NEG RVP (trach asp) 5/19 >> NEG Legionella cx (trach asp) 5/19 >>   ANTIMICROBIALS:  Pip-tazo 5/18 X 1 Vanc 5/18 >> 5/20 Levofloxacin 5/19 >>     SUBJECTIVE:  RN reports mild abd distention, no BM.  Remains sedate/ paralyzed on vent  VITAL SIGNS: Temp:  [99.2 F (37.3 C)-100.5 F (38.1 C)] 99.4 F (37.4 C) (05/22 0700) Pulse Rate:  [89-106] 92 (05/22 0814) Resp:  [23-35] 35 (05/22 0814) BP: (85-110)/(58-70) 108/59 mmHg (05/22 0814) SpO2:  [87 %-100 %] 97 % (05/22 0845) Arterial Line BP: (86-118)/(49-62) 112/61 mmHg (05/22 0700) FiO2 (%):  [70 %-90 %] 80 % (05/22 0845) Weight:  [188 lb 0.8 oz (85.3 kg)] 188 lb  0.8 oz (85.3 kg) (05/22 0439)   HEMODYNAMICS:     VENTILATOR SETTINGS: Vent Mode:  [-] PRVC FiO2 (%):  [70 %-90 %] 80 % Set Rate:  [35 bmp] 35 bmp Vt Set:  [410 mL] 410 mL PEEP:  [14 cmH20-16 cmH20] 14 cmH20 Plateau Pressure:  [27 cmH20-36 cmH20] 28 cmH20   INTAKE / OUTPUT: Intake/Output      05/21 0701 - 05/22 0700 05/22 0701 - 05/23 0700   I.V. (mL/kg) 1526.1 (17.9) 255.2 (3)   NG/GT 1330 325   IV Piggyback 457 129   Total Intake(mL/kg) 3313.1 (38.8) 709.2 (8.3)   Urine (mL/kg/hr) 1080 (0.5) 275 (0.7)   Emesis/NG output     Total Output 1080 275   Net +2233.1 +434.2          PHYSICAL EXAMINATION: General: sedated, paralyzed on vent HENT: NCAT ETT in place PULM: coarse breath sounds, improved, no wheeze CV: Regular, no M GI: BS+, soft, nontender Ext: trace symmetric edema Neuro: sedated, paralyzed on vent  LABS:  CBC  Recent Labs Lab 01/12/15 0300 01/13/15 0420 01/14/15 0230  WBC 14.2* 16.2* 12.3*  HGB 14.6 14.3 13.3  HCT 42.9 43.2 39.3  PLT 135* 134* 130*   Coag's No results for input(s): APTT, INR in the last 168 hours.   BMET  Recent Labs Lab 01/12/15 0300 01/13/15 0420 01/14/15 0230  NA 130* 136 139  K 3.4* 3.1* 3.7  CL 101 105  108  CO2 $Re'24 27 23  'QjV$ BUN 28* 34* 44*  CREATININE 0.83 1.00 1.15  GLUCOSE 282* 223* 192*   Electrolytes  Recent Labs Lab 01/12/15 0300 01/13/15 0420 01/14/15 0230  CALCIUM 7.8* 8.1* 8.6*  MG 2.1 2.0 1.8  PHOS 1.1* 1.7* 1.1*   Sepsis Markers  Recent Labs Lab 01/11/15 0155 01/11/15 0428 01/11/15 1125  LATICACIDVEN 1.8 2.2* 1.8   ABG  Recent Labs Lab 01/13/15 2352 01/14/15 0306 01/14/15 0407  PHART 7.261* 7.247* 7.267*  PCO2ART 44.4 47.8* 43.3  PO2ART 107.0* 56.0* 83.0   Liver Enzymes  Recent Labs Lab 01/12/15 0300 01/13/15 0420 01/14/15 0230  AST 218* 147* 138*  ALT 62 59 54  ALKPHOS 270* 394* 444*  BILITOT 3.2* 3.4* 3.2*  ALBUMIN 1.9* 1.7* 1.6*   Cardiac Enzymes No results for  input(s): TROPONINI, PROBNP in the last 168 hours.   Glucose  Recent Labs Lab 01/13/15 1156 01/13/15 1556 01/13/15 1927 01/13/15 2350 01/14/15 0402 01/14/15 0812  GLUCAP 165* 214* 267* 225* 190* 192*    CXR: 5/22 - improved bilateral airspace disease    ASSESSMENT / PLAN:  PULMONARY A: Acute hypoxemic respiratory failure   ARDS Legionella Pneumonia Former smoker Airflow obstruction Mucus plugging P:   Cont ARDS protocol Cont Nimbex  VAP prevention measures  Continue nebulized steroids & BD's  Nebulized NAC started 5/21  INFECTIOUS A:   Legionella PNA P:   Micro and abx as above (stop vanc) Trend WBC / fever curve   CARDIOVASCULAR A:  Hx HTN P:  Holding outpatient HCTZ, ramipril MAP goal > 65 mmHg  RENAL A:   Hyponatremia, resolved Hypokalemia Hypophos AKI, nonoliguric P:   Monitor BMET intermittently Monitor I/Os Correct electrolytes as indicated  GASTROINTESTINAL A:   H/O GERD, chronic PPI Elevated AST and alk phos> likely related to legionella Hyperbilirubinemia> ultrasound unremarkable Hx pancreatitis Likely malnutrition P:   SUP: IV Pantoprazole Monitor LFTs intermittently TF per protocol  Bowel regimen PRN  HEMATOLOGIC A:   Mild thrombocytopenia P:  DVT px: SQ heparin Monitor CBC intermittently Transfuse per usual ICU guidelines  ENDOCRINE A:   DM II  P:   Lantus 15 units BID  TF coverage, 4 units Q6 Cont Mod scale SSI  NEUROLOGIC A:   Chronic lower back pain Vent/ICU associated discomfort P:   Holding outpatient oxycodone, sertraline, gabapentin, Baclofen PAD protocol 5/19 - fent infusion, versed gtt Cont NMB as above Continue baclofen to prevent withdrawal   Family updated: No family @ bedside 5/22  Interdisciplinary Family Meeting v Palliative Care Meeting:  Due by: 5/25  Noe Gens, NP-C [place] Pgr: [phone] or [phone]   PCCM ATTENDING: I have reviewed pt's initial  presentation, consultants notes and hospital database in detail.  The above assessment and plan was formulated under my direction.  In summary: Sever legionella PNA with ARDS Nonoliguric AKI CXR is improving as is gas exchange and ventilator mechanics Will cont current vent settings weaning FiO2 and PEEP per ARDS algorithm Cont NMB X 1 more day - likely will be able to stop 5/23 Cont levofloxacin Wife was updated in detail 5/21  40 minutes of independent CCM time was provided by me   Merton Border, MD;  PCCM service; Mobile [phone]

## 2015-01-15 ENCOUNTER — Inpatient Hospital Stay (HOSPITAL_COMMUNITY): Payer: Medicare Other

## 2015-01-15 DIAGNOSIS — J9600 Acute respiratory failure, unspecified whether with hypoxia or hypercapnia: Secondary | ICD-10-CM

## 2015-01-15 DIAGNOSIS — R509 Fever, unspecified: Secondary | ICD-10-CM

## 2015-01-15 DIAGNOSIS — R17 Unspecified jaundice: Secondary | ICD-10-CM | POA: Insufficient documentation

## 2015-01-15 LAB — COMPREHENSIVE METABOLIC PANEL
ALT: 53 U/L (ref 17–63)
ANION GAP: 7 (ref 5–15)
AST: 135 U/L — ABNORMAL HIGH (ref 15–41)
Albumin: 1.6 g/dL — ABNORMAL LOW (ref 3.5–5.0)
Alkaline Phosphatase: 473 U/L — ABNORMAL HIGH (ref 38–126)
BUN: 47 mg/dL — ABNORMAL HIGH (ref 6–20)
CO2: 23 mmol/L (ref 22–32)
Calcium: 8.7 mg/dL — ABNORMAL LOW (ref 8.9–10.3)
Chloride: 111 mmol/L (ref 101–111)
Creatinine, Ser: 1.08 mg/dL (ref 0.61–1.24)
GFR calc Af Amer: >60 mL/min (ref >60)
GFR calc non Af Amer: >60 mL/min (ref >60)
GLUCOSE: 249 mg/dL — AB (ref 65–99)
Potassium: 4.1 mmol/L (ref 3.5–5.1)
SODIUM: 141 mmol/L (ref 135–145)
Total Bilirubin: 2.8 mg/dL — ABNORMAL HIGH (ref 0.3–1.2)
Total Protein: 5.1 g/dL — ABNORMAL LOW (ref 6.5–8.1)

## 2015-01-15 LAB — GLUCOSE, CAPILLARY
GLUCOSE-CAPILLARY: 167 mg/dL — AB (ref 65–99)
GLUCOSE-CAPILLARY: 196 mg/dL — AB (ref 65–99)
Glucose-Capillary: 216 mg/dL — ABNORMAL HIGH (ref 65–99)
Glucose-Capillary: 293 mg/dL — ABNORMAL HIGH (ref 65–99)
Glucose-Capillary: 277 mg/dL — ABNORMAL HIGH (ref 65–99)
Glucose-Capillary: 216 mg/dL — ABNORMAL HIGH (ref 65–99)

## 2015-01-15 LAB — CBC
HCT: 36.2 % — ABNORMAL LOW (ref 39.0–52.0)
Hemoglobin: 11.9 g/dL — ABNORMAL LOW (ref 13.0–17.0)
MCH: 31.3 pg (ref 26.0–34.0)
MCHC: 32.9 g/dL (ref 30.0–36.0)
MCV: 95.3 fL (ref 78.0–100.0)
PLATELETS: 147 10*3/uL — AB (ref 150–400)
RBC: 3.8 MIL/uL — ABNORMAL LOW (ref 4.22–5.81)
RDW: 15.8 % — AB (ref 11.5–15.5)
WBC: 12.5 10*3/uL — ABNORMAL HIGH (ref 4.0–10.5)

## 2015-01-15 LAB — GRAM STAIN: Special Requests: NORMAL

## 2015-01-15 LAB — MAGNESIUM: Magnesium: 1.8 mg/dL (ref 1.7–2.4)

## 2015-01-15 LAB — PHOSPHORUS: PHOSPHORUS: 3.2 mg/dL (ref 2.5–4.6)

## 2015-01-15 MED ORDER — DEXTROSE 50 % IV SOLN
INTRAVENOUS | Status: AC
  Filled 2015-01-15: qty 50

## 2015-01-15 MED ORDER — INSULIN GLARGINE 100 UNIT/ML ~~LOC~~ SOLN
25.0000 [IU] | Freq: Two times a day (BID) | SUBCUTANEOUS | Status: DC
  Administered 2015-01-15 (×2): 25 [IU] via SUBCUTANEOUS
  Filled 2015-01-15 (×4): qty 0.25

## 2015-01-15 MED ORDER — FUROSEMIDE 10 MG/ML IJ SOLN
40.0000 mg | Freq: Two times a day (BID) | INTRAMUSCULAR | Status: DC
  Administered 2015-01-15 – 2015-01-17 (×5): 40 mg via INTRAVENOUS
  Filled 2015-01-15 (×7): qty 4

## 2015-01-15 NOTE — Significant Event (Signed)
Patient transitioned to a pronging bed and prone at 1845pm. Patient is stable throughout procedure. All lines/tubes are in placed. ETT @ 24 at the lips. Patient to be prone for 16 hours. Incoming RN made aware and given report.

## 2015-01-15 NOTE — Progress Notes (Signed)
PULMONARY / CRITICAL CARE MEDICINE   Name: [name] MRN: [id number] DOB: [birth year]    ADMISSION DATE:  01/10/2015 CONSULTATION DATE:  01/15/2015  REFERRING MD :  EDP  CHIEF COMPLAINT:  SOB, cough  INITIAL PRESENTATION:   54 y.o. M presented to [facility] ED late 5/18 PM with acute hypoxic respiratory failure and severe bilateral infiltrates. Required intubation in AM 5/19. Treated as PNA, ARDS.   MAJOR EVENTS/TEST RESULTS: 5/18 Admit via ED to ICU/PCCM service 5/19 AM: intubated. ARDS protocol 5/19 RUQ [place] (elevated bilirubin): s/p chole, nothing acute 5/20 worsening gas exchange. Started on Nimbex. Prone ventilation. Vasopressors initiated 5/21 Off vasopressors./ Severe gas trapping. Very high PIPs. Quick look FOB > extensive thick secretions in proximal airway and partially obstructing tip of ETT. Secretions successfully suctioned and removed. PIPs markedly improved. Pt left in supine position after FOB. ARDS protocol. Vt reduced to 6 cc/kg IBW. Gas exchange much improved  INDWELLING DEVICES:: ETT 5/19 >>  R IJ CVL 5/19 >>  R radial A-line 5/19 >>   MICRO DATA: MRSA PCR 5/18 >> NEG Blood 5/18 >> NEG Strep Ag 5/19 >> NEG Legionella Ag 5/19 >> POSITIVE BAL 5/19 >>  Flu panel (trach asp) 5/19 >> NEG RVP (trach asp) 5/19 >> NEG Legionella cx (trach asp) 5/19 >>   ANTIMICROBIALS:  Pip-tazo 5/18 X 1 Vanc 5/18 >> 5/20 Levofloxacin 5/19 >>    SUBJECTIVE:  Paralyzed  VITAL SIGNS: Temp:  [99.4 F (37.4 C)-101 F (38.3 C)] 100 F (37.8 C) (05/23 0800) Pulse Rate:  [88-102] 102 (05/23 0800) Resp:  [23-35] 35 (05/23 0800) BP: (100-118)/(49-68) 117/68 mmHg (05/23 0800) SpO2:  [75 %-97 %] 92 % (05/23 0800) Arterial Line BP: (96-133)/(49-66) 124/61 mmHg (05/23 0800) FiO2 (%):  [70 %-80 %] 70 % (05/23 0800) Weight:  [87.9 kg (193 lb 12.6 oz)] 87.9 kg (193 lb 12.6 oz) (05/23 0500) HEMODYNAMICS:   VENTILATOR SETTINGS: Vent Mode:  [-] PRVC FiO2 (%):  [70 %-80 %] 70 % Set  Rate:  [35 bmp] 35 bmp Vt Set:  [410 mL] 410 mL PEEP:  [12 cmH20-14 cmH20] 12 cmH20 Plateau Pressure:  [19 cmH20-28 cmH20] 27 cmH20 INTAKE / OUTPUT: Intake/Output      05/22 0701 - 05/23 0700 05/23 0701 - 05/24 0700   I.V. (mL/kg) 1509.7 (17.2) 59.6 (0.7)   NG/GT 1675 55   IV Piggyback 279    Total Intake(mL/kg) 3463.7 (39.4) 114.6 (1.3)   Urine (mL/kg/hr) 2065 (1) 60 (0.3)   Total Output 2065 60   Net +1398.7 +54.6          PHYSICAL EXAMINATION: General: sedated, paralyzed on vent HENT: line clean ETT in place PULM: rhonchi bilateral CV: Regular, no M GI: BS+, soft, nontender Ext: trace symmetric edema Neuro: sedated, paralyzed on vent, rass -4  LABS:  CBC  Recent Labs Lab 01/13/15 0420 01/14/15 0230 01/15/15 0418  WBC 16.2* 12.3* 12.5*  HGB 14.3 13.3 11.9*  HCT 43.2 39.3 36.2*  PLT 134* 130* 147*   Coag's No results for input(s): APTT, INR in the last 168 hours. BMET  Recent Labs Lab 01/13/15 0420 01/14/15 0230 01/15/15 0418  NA 136 139 141  K 3.1* 3.7 4.1  CL 105 108 111  CO2 _0 BUN 34* 44* 47*  CREATININE 1.00 1.15 1.08  GLUCOSE 223* 192* 249*   Electrolytes  Recent Labs Lab 01/13/15 0420 01/14/15 0230 01/14/15 2033 01/15/15 0418  CALCIUM 8.1* 8.6*  --  8.7*  MG 2.0 1.8  --  1.8  PHOS 1.7* 1.1* 2.6 3.2   Sepsis Markers  Recent Labs Lab 01/11/15 0155 01/11/15 0428 01/11/15 1125  LATICACIDVEN 1.8 2.2* 1.8   ABG  Recent Labs Lab 01/14/15 0306 01/14/15 0407 01/14/15 1656  PHART 7.247* 7.267* 7.304*  PCO2ART 47.8* 43.3 43.2  PO2ART 56.0* 83.0 75.0*   Liver Enzymes  Recent Labs Lab 01/13/15 0420 01/14/15 0230 01/15/15 0418  AST 147* 138* 135*  ALT 59 54 53  ALKPHOS 394* 444* 473*  BILITOT 3.4* 3.2* 2.8*  ALBUMIN 1.7* 1.6* 1.6*   Cardiac Enzymes No results for input(s): TROPONINI, PROBNP in the last 168 hours. Glucose  Recent Labs Lab 01/14/15 1155 01/14/15 1610 01/14/15 1928 01/14/15 2346  01/15/15 0343 01/15/15 0816  GLUCAP 243* 258* 201* 196* 216* 293*    CXR: Severe ARDS pattern improved aeration, et twnl   ASSESSMENT / PLAN:  PULMONARY A: Acute hypoxemic respiratory failure   ARDS Legionella Pneumonia Former smoker Airflow obstruction Mucus plugging P:   Cont ARDS protocol pao2 noted 75, but peep 12, 70%, he re requires prone position if repeat bronch not sig with obstruction (improved sig with last bronch), goal prone 16 hr, abg in 4 hrs after supine  Neg balance, assess cvp, goal 4 - FACTT Cont Nimbex  Cont Vent bundle Add nebulized steroids 5/21 nebulized bronchodilators 5/21, no wheezing, can worsen ards Nebulized NAC 5/21, allow to dc abg reviewed, rate is maxed  INFECTIOUS A:   Legionella PNA P:   Micro and abx as above (stop vanc)  Will need 21 days levofloxacin If repeat bronch, will bal  CARDIOVASCULAR A:  Hx HTN P:  MAP goal > 60 mmHg Get cvp, lasix  RENAL A:   Hyponatremia, resolved Hypokalemia AKI, nonoliguric ARDS P:   Monitor BMET intermittently Monitor I/Os Correct electrolytes as indicated Lasix 40 bid, kvo, cvp to goal 4  GASTROINTESTINAL A:   H/O GERD, chronic PPI Elevated AST and alk phos> likely related to legionella Hyperbilirubinemia> ultrasound unremarkable Hx pancreatitis P:   SUP: IV Pantoprazole Monitor LFTs intermittently TF protocol 5/19 Get a post pyloric feeding tube  HEMATOLOGIC A:   Mild thrombocytopenia P:  DVT px: SQ heparin Monitor CBC intermittently Transfuse per usual ICU guidelines  ENDOCRINE A:   DM 2, now controlled P:   Cont Lantus but increase Cont Mod scale SSI  NEUROLOGIC A:   Chronic lower back pain Vent/ICU associated discomfort P:   Holding outpatient oxycodone, sertraline, gabapentin, Baclofen PAD protocol 5/19 - fent infusion, versed gtt Cont NMB Continue baclofen to prevent withdrawal   Family updated: o family @ bedside 5/21  Interdisciplinary Family  Meeting v Palliative Care Meeting:  Due by: 5/25  CC time:  40 minutes.  Lavon Paganini. Titus Mould, MD, [place] Pgr: [facility]

## 2015-01-15 NOTE — Telephone Encounter (Signed)
Infection control was notified and changes made last week reflective of this diagnosis before the first page was sent to me.  See chart.

## 2015-01-15 NOTE — Telephone Encounter (Signed)
Per Dr. Kendrick FriesMcQuaid, this message has been taken care of, okay to close encounter... Nothing further needed.

## 2015-01-15 NOTE — Significant Event (Addendum)
Panda tube advance 12cm per verbal order from Dr. Tyson AliasFeinstein after KUB of panda tube placement. MD wants it post pyloric.  Another KUB order per MD requests. Jacquese Hackman, Charity fundraiserN.

## 2015-01-15 NOTE — Procedures (Signed)
Bronchoscopy Procedure Note [name] [id number] 10/09/1960  Procedure: Bronchoscopy Indications: Diagnostic evaluation of the airways and Remove secretions  Procedure Details Consent: Unable to obtain consent because of emergent medical necessity. desat and prior mucous plug noted Time Out: Verified patient identification, verified procedure, site/side was marked, verified correct patient position, special equipment/implants available, medications/allergies/relevent history reviewed, required imaging and test results available.  Performed  In preparation for procedure, patient was given 100% FiO2 and bronchoscope lubricated. Sedation: continous prior sedartion  Airway entered and the following bronchi were examined: RUL, RML, RLL, LUL, LLL and Bronchi.   Procedures performed: Brushings performed -no Bronchoscope removed.    Evaluation Hemodynamic Status: BP stable throughout; O2 sats: stable throughout Patient's Current Condition: stable Specimens:  Sent serosanguinous fluid - bilous on left Complications: No apparent complications Patient did tolerate procedure well.   [name],[name]   1. No sig mucous plug 2. Bile appearig secretiones LLL 3. BAL yellow  [name] Rossettianiel J. Tyson AliasFeinstein, MD, FACP Pgr: [phone] [place]

## 2015-01-15 NOTE — Significant Event (Signed)
Per the request of Dr. Hazle QuantFeintstein, RN called infection control to request for them to contact state to let them know of legionella infection.   Spoke with on call infection control who stated that [facility]

## 2015-01-15 NOTE — Significant Event (Signed)
MD Tyson AliasFeinstein wants panda tube post pyloric for feeding and for RN to advance additional 6cm and reorder KUB. RN advanced tube.

## 2015-01-15 NOTE — Significant Event (Signed)
Panda tube still in same position even after advancing 6cm. Per Dr. Tyson AliasFeinstein, if panda tube is NOT post pyloric, do not feed patient.  Patient NPO and OG to intermittent suctioning per verbal orders from MD.

## 2015-01-15 NOTE — Telephone Encounter (Signed)
Re-paged and message sent to BQ to address.

## 2015-01-16 ENCOUNTER — Inpatient Hospital Stay (HOSPITAL_COMMUNITY): Payer: Medicare Other

## 2015-01-16 LAB — BLOOD GAS, ARTERIAL
ACID-BASE EXCESS: 6 mmol/L — AB (ref 0.0–2.0)
Acid-Base Excess: 6.8 mmol/L — ABNORMAL HIGH (ref 0.0–2.0)
BICARBONATE: 29.7 meq/L — AB (ref 20.0–24.0)
BICARBONATE: 30.3 meq/L — AB (ref 20.0–24.0)
Drawn by: 12971
Drawn by: 129711
FIO2: 0.6 %
FIO2: 0.5 %
LHR: 35 {breaths}/min
LHR: 22 {breaths}/min
MECHVT: 410 mL
MECHVT: 0.41 mL
O2 Saturation: 97.4 %
O2 Saturation: 93.8 %
PATIENT TEMPERATURE: 98.6
PCO2 ART: 34.4 mmHg — AB (ref 35.0–45.0)
PEEP: 10 cmH2O
PEEP: 10 cmH2O
PO2 ART: 73.5 mmHg — AB (ref 80.0–100.0)
Patient temperature: 98.6
TCO2: 30.7 mmol/L (ref 0–100)
TCO2: 31.7 mmol/L (ref 0–100)
pCO2 arterial: 47 mmHg — ABNORMAL HIGH (ref 35.0–45.0)
pH, Arterial: 7.545 — ABNORMAL HIGH (ref 7.350–7.450)
pH, Arterial: 7.426 (ref 7.350–7.450)
pO2, Arterial: 139 mmHg — ABNORMAL HIGH (ref 80.0–100.0)

## 2015-01-16 LAB — COMPREHENSIVE METABOLIC PANEL
ALK PHOS: 472 U/L — AB (ref 38–126)
ALT: 45 U/L (ref 17–63)
AST: 95 U/L — AB (ref 15–41)
Albumin: 1.7 g/dL — ABNORMAL LOW (ref 3.5–5.0)
Anion gap: 9 (ref 5–15)
BILIRUBIN TOTAL: 2.3 mg/dL — AB (ref 0.3–1.2)
BUN: 50 mg/dL — ABNORMAL HIGH (ref 6–20)
CALCIUM: 8.9 mg/dL (ref 8.9–10.3)
CO2: 27 mmol/L (ref 22–32)
Chloride: 107 mmol/L (ref 101–111)
Creatinine, Ser: 1.07 mg/dL (ref 0.61–1.24)
GFR calc Af Amer: >60 mL/min (ref >60)
GFR calc non Af Amer: >60 mL/min (ref >60)
Glucose, Bld: 92 mg/dL (ref 65–99)
Potassium: 3.4 mmol/L — ABNORMAL LOW (ref 3.5–5.1)
Sodium: 143 mmol/L (ref 135–145)
TOTAL PROTEIN: 5.3 g/dL — AB (ref 6.5–8.1)

## 2015-01-16 LAB — CBC
HEMATOCRIT: 36 % — AB (ref 39.0–52.0)
Hemoglobin: 12 g/dL — ABNORMAL LOW (ref 13.0–17.0)
MCH: 31.6 pg (ref 26.0–34.0)
MCHC: 33.3 g/dL (ref 30.0–36.0)
MCV: 94.7 fL (ref 78.0–100.0)
Platelets: 174 10*3/uL (ref 150–400)
RBC: 3.8 MIL/uL — ABNORMAL LOW (ref 4.22–5.81)
RDW: 15.8 % — ABNORMAL HIGH (ref 11.5–15.5)
WBC: 15.3 10*3/uL — ABNORMAL HIGH (ref 4.0–10.5)

## 2015-01-16 LAB — POCT I-STAT 3, ART BLOOD GAS (G3+)
Acid-Base Excess: 4 mmol/L — ABNORMAL HIGH (ref 0.0–2.0)
BICARBONATE: 30.2 meq/L — AB (ref 20.0–24.0)
O2 SAT: 95 %
PO2 ART: 85 mmHg (ref 80.0–100.0)
Patient temperature: 101.7
TCO2: 32 mmol/L (ref 0–100)
pCO2 arterial: 52.8 mmHg — ABNORMAL HIGH (ref 35.0–45.0)
pH, Arterial: 7.373 (ref 7.350–7.450)

## 2015-01-16 LAB — GLUCOSE, CAPILLARY
GLUCOSE-CAPILLARY: 101 mg/dL — AB (ref 65–99)
GLUCOSE-CAPILLARY: 102 mg/dL — AB (ref 65–99)
GLUCOSE-CAPILLARY: 122 mg/dL — AB (ref 65–99)
GLUCOSE-CAPILLARY: 86 mg/dL (ref 65–99)
Glucose-Capillary: 83 mg/dL (ref 65–99)
Glucose-Capillary: 156 mg/dL — ABNORMAL HIGH (ref 65–99)

## 2015-01-16 MED ORDER — IOHEXOL 300 MG/ML  SOLN
50.0000 mL | Freq: Once | INTRAMUSCULAR | Status: AC | PRN
  Administered 2015-01-16: 20 mL

## 2015-01-16 MED ORDER — POTASSIUM CHLORIDE 10 MEQ/50ML IV SOLN
10.0000 meq | INTRAVENOUS | Status: AC
  Administered 2015-01-16 (×4): 10 meq via INTRAVENOUS
  Filled 2015-01-16 (×4): qty 50

## 2015-01-16 NOTE — Progress Notes (Signed)
ANTIBIOTIC CONSULT NOTE  Pharmacy Consult for Levofloxacin Indication: HCAP - Legionella  + MSSA  Allergies  Allergen Reactions  . Ketorolac Tromethamine     Hives (toradol)    Patient Measurements: Height:  (172.7 cm) Weight: 182 lb 15.7 oz (83 kg) IBW/kg (Calculated) : 68.4   Vital Signs: Temp: 99.9 F (37.7 C) (05/24 1000) Temp Source: Core (Comment) (05/24 0800) BP: 131/80 mmHg (05/24 1000) Pulse Rate: 85 (05/24 1000) Intake/Output from previous day: 05/23 0701 - 05/24 0700 In: 1811.1 [I.V.:1463.6; NG/GT:197.5; IV Piggyback:150] Out: 5185 [Urine:5125; Emesis/NG output:60] Intake/Output from this shift: Total I/O In: 227 [I.V.:177; IV Piggyback:50] Out: 1675 [Urine:1675]  Labs:  Recent Labs  01/14/15 0230 01/15/15 0418 01/16/15 0500  WBC 12.3* 12.5* 15.3*  HGB 13.3 11.9* 12.0*  PLT 130* 147* 174  CREATININE 1.15 1.08 1.07   Estimated Creatinine Clearance: 84.8 mL/min (by C-G formula based on Cr of 1.07). No results for input(s): VANCOTROUGH, VANCOPEAK, VANCORANDOM, GENTTROUGH, GENTPEAK, GENTRANDOM, TOBRATROUGH, TOBRAPEAK, TOBRARND, AMIKACINPEAK, AMIKACINTROU, AMIKACIN in the last 72 hours.   Microbiology: Recent Results (from the past 720 hour(s))  Blood Culture (routine x 2)     Status: None (Preliminary result)   Collection Time: 01/10/15 11:29 PM  Result Value Ref Range Status   Specimen Description BLOOD LEFT HAND  Final   Special Requests BOTTLES DRAWN AEROBIC AND ANAEROBIC 5CC  Final   Culture   Final           BLOOD CULTURE RECEIVED NO GROWTH TO DATE CULTURE WILL BE HELD FOR 5 DAYS BEFORE ISSUING A FINAL NEGATIVE REPORT Performed at [facility]    Report Status PENDING  Incomplete  Blood Culture (routine x 2)     Status: None (Preliminary result)   Collection Time: 01/10/15 11:30 PM  Result Value Ref Range Status   Specimen Description BLOOD RIGHT HAND  Final   Special Requests BOTTLES DRAWN AEROBIC AND ANAEROBIC 5CC  Final   Culture   Final           BLOOD CULTURE RECEIVED NO GROWTH TO DATE CULTURE WILL BE HELD FOR 5 DAYS BEFORE ISSUING A FINAL NEGATIVE REPORT Performed at [facility]    Report Status PENDING  Incomplete  MRSA PCR Screening     Status: None   Collection Time: 01/11/15  1:40 AM  Result Value Ref Range Status   MRSA by PCR NEGATIVE NEGATIVE Final    Comment:        The GeneXpert MRSA Assay (FDA approved for NASAL specimens only), is one component of a comprehensive MRSA colonization surveillance program. It is not intended to diagnose MRSA infection nor to guide or monitor treatment for MRSA infections.   Culture, respiratory (NON-Expectorated)     Status: None   Collection Time: 01/11/15  7:00 AM  Result Value Ref Range Status   Specimen Description TRACHEAL ASPIRATE  Final   Special Requests NONE  Final   Gram Stain   Final    NO WBC SEEN NO SQUAMOUS EPITHELIAL CELLS SEEN NO ORGANISMS SEEN Performed at [facility]    Culture   Final    NO GROWTH 2 DAYS Performed at [facility]    Report Status 01/13/2015 FINAL  Final  Respiratory virus panel     Status: None   Collection Time: 01/11/15  7:00 AM  Result Value Ref Range Status   Source - RVPAN TRACHEAL ASPIRATE  Corrected   Respiratory Syncytial Virus A Negative Negative Final  Respiratory Syncytial Virus B Negative Negative Final   Influenza A Negative Negative Final   Influenza B Negative Negative Final   Parainfluenza 1 Negative Negative Final   Parainfluenza 2 Negative Negative Final   Parainfluenza 3 Negative Negative Final   Metapneumovirus Negative Negative Final   Rhinovirus Negative Negative Final   Adenovirus Negative Negative Final    Comment: (NOTE) Performed At: [facility] [address] [place], [place] [id number] Mila HomerHancock William F MD WU:[id number]:[phone]   Respiratory virus panel     Status: None   Collection Time: 01/11/15  9:54 AM  Result Value Ref Range Status    Respiratory Syncytial Virus A Negative Negative Final   Respiratory Syncytial Virus B Negative Negative Final   Influenza A Negative Negative Final   Influenza B Negative Negative Final   Parainfluenza 1 Negative Negative Final   Parainfluenza 2 Negative Negative Final   Parainfluenza 3 Negative Negative Final   Metapneumovirus Negative Negative Final   Rhinovirus Negative Negative Final   Adenovirus Negative Negative Final    Comment: (NOTE) Performed At: [facility]  [address] [place], [place] [id number] Mila HomerHancock William F MD VH:[id number]:[phone]   Culture, respiratory (NON-Expectorated)     Status: None   Collection Time: 01/11/15 12:30 PM  Result Value Ref Range Status   Specimen Description BRONCHIAL ALVEOLAR LAVAGE  Final   Special Requests Normal  Final   Gram Stain   Final    RARE WBC PRESENT, PREDOMINANTLY PMN NO SQUAMOUS EPITHELIAL CELLS SEEN NO ORGANISMS SEEN Performed at [facility]    Culture   Final    FEW STAPHYLOCOCCUS AUREUS Note: RIFAMPIN AND GENTAMICIN SHOULD NOT BE USED AS SINGLE DRUGS FOR TREATMENT OF STAPH INFECTIONS. Performed at [facility]    Report Status 01/14/2015 FINAL  Final   Organism ID, Bacteria STAPHYLOCOCCUS AUREUS  Final      Susceptibility   Staphylococcus aureus - MIC*    CLINDAMYCIN <=0.25 SENSITIVE Sensitive     ERYTHROMYCIN 0.5 SENSITIVE Sensitive     GENTAMICIN <=0.5 SENSITIVE Sensitive     LEVOFLOXACIN <=0.12 SENSITIVE Sensitive     OXACILLIN 0.5 SENSITIVE Sensitive     PENICILLIN >=0.5 RESISTANT Resistant     RIFAMPIN <=0.5 SENSITIVE Sensitive     TRIMETH/SULFA <=10 SENSITIVE Sensitive     VANCOMYCIN <=0.5 SENSITIVE Sensitive     TETRACYCLINE <=1 SENSITIVE Sensitive     MOXIFLOXACIN <=0.25 SENSITIVE Sensitive     * FEW STAPHYLOCOCCUS AUREUS  Stat Gram stain     Status: None   Collection Time: 01/15/15  2:00 PM  Result Value Ref Range Status   Specimen Description TRACHEAL ASPIRATE  Final   Special  Requests Normal  Final   Gram Stain   Final    RARE WBC PRESENT,BOTH PMN AND MONONUCLEAR NO ORGANISMS SEEN    Report Status 01/15/2015 FINAL  Final  Culture, respiratory (NON-Expectorated)     Status: None (Preliminary result)   Collection Time: 01/15/15  2:00 PM  Result Value Ref Range Status   Specimen Description TRACHEAL ASPIRATE  Final   Special Requests NONE  Final   Gram Stain   Final    RARE WBC PRESENT,BOTH PMN AND MONONUCLEAR NO ORGANISMS SEEN Performed at [facility]  Performed at [facility]    Culture   Final    NO GROWTH 1 DAY Performed at [facility]    Report Status PENDING  Incomplete   Assessment: 54 y.o male presented to ED  5/18 PM with chief complaint of SOB and cough + productive cough, fever, and generalized weakness x 4 - 5 days. Found to be positive for legionella antigen as well as MSSA in BAL - both sensitive to Levofloxacin. WBC down to 15.3, afebrile. ARDS on ventilator and currently in prone position. SCr 1.07 with est CrCl ~80-31mL/min.  Goal of Therapy:  Proper dosing based on hepatic and renal function  Plan:  Continue Levofloxacin  IV q24h Follow clinical progression, renal function, LOT  Link Snuffer, PharmD, BCPS Clinical Pharmacist [phone]  01/16/2015 12:57 PM

## 2015-01-16 NOTE — Progress Notes (Signed)
RT note- patient was placed supine, fio2 and peep adjusted for sp02 79% when initially turned. Recruitment maneuver performed, sp02 now 95%

## 2015-01-16 NOTE — Progress Notes (Signed)
Pt's  moving head and leg moving although maxed out on sedation gtts and on high dose paralytic gtt. Dr. Dema SeverinMungal called and new orders carried out.

## 2015-01-16 NOTE — Progress Notes (Signed)
Nutrition Follow-up  DOCUMENTATION CODES:  Not applicable  INTERVENTION:  Tube feeding (Initiate TF via OGT with VItal AF 1.2 at 25 ml/h and Prostat 30 ml BID on day 1; on day 2, increase to goal rate of 55 ml/h (1320 ml per day) to provide 1784 kcals, 129 gm protein, 1071 ml free water daily.)  NUTRITION DIAGNOSIS:  Inadequate oral intake related to inability to eat as evidenced by NPO status.  ongoing  GOAL:  Patient will meet greater than or equal to 90% of their needs  unmet  MONITOR:  Vent status, Labs, Weight trends, TF tolerance, Skin, I & O's  REASON FOR ASSESSMENT:  Ventilator, Consult Enteral/tube feeding initiation and management  ASSESSMENT: 54 y.o. M [facility] late 5/18 PM with acute hypoxic respiratory failure and severe bilateral infiltrates. Required intubation in AM 5/19. Treated as PNA, ARDS.   Patient is currently intubated on ventilator support. OGT in place.  MV: 9.5 L/min Temp (24hrs), Avg:100 F (37.8 C), Min:99.5 F (37.5 C), Max:100.5 F (38.1 C) Propofol: n/a   Pt's TF has been on and off in the last several days due to pt being put into prone position. Karie Sodaanda was attempted to advanced by 18 cm on 5/23 to be able to feed postpylorically, but per nursing notes it is still in the same place. Pt to remain NPO if tube in not past pylorus, per MD.   Per RN TF is to be restarted now as pt was just put back to supine position. Will continue to monitor.  Labs reviewed: K 3.4, BUN 50, Glu 92 - 249  Height:  Ht Readings from Last 1 Encounters:  01/11/15 5\' 8"  (1.727 m)    Weight:  Wt Readings from Last 1 Encounters:  01/16/15 182 lb 15.7 oz (83 kg)    Ideal Body Weight:  70 kg  Wt Readings from Last 10 Encounters:  01/16/15 182 lb 15.7 oz (83 kg)  08/12/13 178 lb (80.74 kg)  07/30/13 176 lb (79.833 kg)  04/22/12 166 lb (75.297 kg)  03/04/11 141 lb (63.957 kg)  06/12/10 156 lb (70.761 kg)  05/01/10 163 lb (73.936 kg)   10/02/09 164 lb 4 oz (74.503 kg)  08/29/09 161 lb (73.029 kg)  05/31/09 167 lb (75.751 kg)    BMI:  Body mass index is 27.83 kg/(m^2).  Estimated Nutritional Needs:  Kcal:  1813.1  Protein:  120-130 grams  Fluid:  > 1.8 L  Skin:  Reviewed, no issues  Diet Order:  Diet NPO time specified  EDUCATION NEEDS:  No education needs identified at this time   Intake/Output Summary (Last 24 hours) at 01/16/15 1603 Last data filed at 01/16/15 1400  Gross per 24 hour  Intake 1626.79 ml  Output   5385 ml  Net -3758.21 ml    Last BM:  5/19  Mikyle Sox A. Katryna Tschirhart Dietetic Intern Pager: [phone] 01/16/2015 4:17 PM

## 2015-01-16 NOTE — Progress Notes (Signed)
PULMONARY / CRITICAL CARE MEDICINE   Name: [name] MRN: [id number] DOB: 1961/08/03    ADMISSION DATE:  01/10/2015 CONSULTATION DATE:  01/16/2015  REFERRING MD :  EDP  CHIEF COMPLAINT:  SOB, cough  INITIAL PRESENTATION:   54 y.o. M presented to [facility] ED late 5/18 PM with acute hypoxic respiratory failure and severe bilateral infiltrates. Required intubation in AM 5/19. Treated as PNA, ARDS.   MAJOR EVENTS/TEST RESULTS: 5/18 Admit via ED to ICU/PCCM service 5/19 AM: intubated. ARDS protocol 5/19 RUQ [place] (elevated bilirubin): s/p chole, nothing acute 5/20 worsening gas exchange. Started on Nimbex. Prone ventilation. Vasopressors initiated 5/21 Off vasopressors./ Severe gas trapping. Very high PIPs. Quick look FOB > extensive thick secretions in proximal airway and partially obstructing tip of ETT. Secretions successfully suctioned and removed. PIPs markedly improved. Pt left in supine position after FOB. ARDS protocol. Vt reduced to 6 cc/kg IBW. Gas exchange much improved 5/23 bronch clean, proned, neg 3.3 liters  INDWELLING DEVICES:: ETT 5/19 >>  R IJ CVL 5/19 >>  R radial A-line 5/19 >>   MICRO DATA: MRSA PCR 5/18 >> NEG Blood 5/18 >> NEG Strep Ag 5/19 >> NEG Legionella Ag 5/19 >> POSITIVE BAL 5/19 >>  Flu panel (trach asp) 5/19 >> NEG RVP (trach asp) 5/19 >> NEG Legionella cx (trach asp) 5/19 >>   ANTIMICROBIALS:  Pip-tazo 5/18 X 1 Vanc 5/18 >> 5/20 Levofloxacin 5/19 >>    SUBJECTIVE:  Proned  VITAL SIGNS: Temp:  [97.6 F (36.4 C)-100.5 F (38.1 C)] 99.5 F (37.5 C) (05/24 0900) Pulse Rate:  [81-105] 82 (05/24 0900) Resp:  [28-37] 35 (05/24 0900) BP: (102-144)/(59-75) 134/71 mmHg (05/24 0900) SpO2:  [90 %-100 %] 99 % (05/24 0900) Arterial Line BP: (110-161)/(9-79) 161/79 mmHg (05/24 0900) FiO2 (%):  [60 %-100 %] 60 % (05/24 0800) Weight:  [83 kg (182 lb 15.7 oz)] 83 kg (182 lb 15.7 oz) (05/24 0456) HEMODYNAMICS: CVP:  [0 mmHg-14 mmHg] 7 mmHg VENTILATOR  SETTINGS: Vent Mode:  [-] PRVC FiO2 (%):  [60 %-100 %] 60 % Set Rate:  [35 bmp] 35 bmp Vt Set:  [410 mL] 410 mL PEEP:  [10 cmH20-12 cmH20] 10 cmH20 Plateau Pressure:  [25 cmH20-37 cmH20] 26 cmH20 INTAKE / OUTPUT: Intake/Output      05/23 0701 - 05/24 0700 05/24 0701 - 05/25 0700   I.V. (mL/kg) 1463.6 (17.6) 120.3 (1.4)   NG/GT 197.5    IV Piggyback 150    Total Intake(mL/kg) 1811.1 (21.8) 120.3 (1.4)   Urine (mL/kg/hr) 5125 (2.6) 1075 (4.6)   Emesis/NG output 60 (0)    Total Output 5185 1075   Net -3373.9 -954.7          PHYSICAL EXAMINATION: General: sedated, paralyzed on vent, proned HENT: line clean ETT in place PULM: rhonchi bilateral improved aeration on examination CV:  s1 s2 RRR GI: BS+ low, soft, nontender Ext: trace symmetric edema- reduced Neuro: sedated, paralyzed on vent, rass -5  LABS:  CBC  Recent Labs Lab 01/14/15 0230 01/15/15 0418 01/16/15 0500  WBC 12.3* 12.5* 15.3*  HGB 13.3 11.9* 12.0*  HCT 39.3 36.2* 36.0*  PLT 130* 147* 174   Coag's No results for input(s): APTT, INR in the last 168 hours. BMET  Recent Labs Lab 01/14/15 0230 01/15/15 0418 01/16/15 0500  NA 139 141 143  K 3.7 4.1 3.4*  CL 108 111 107  CO2 _0 BUN 44* 47* 50*  CREATININE 1.15 1.08 1.07  GLUCOSE  192* 249* 92   Electrolytes  Recent Labs Lab 01/13/15 0420 01/14/15 0230 01/14/15 2033 01/15/15 0418 01/16/15 0500  CALCIUM 8.1* 8.6*  --  8.7* 8.9  MG 2.0 1.8  --  1.8  --   PHOS 1.7* 1.1* 2.6 3.2  --    Sepsis Markers  Recent Labs Lab 01/11/15 0155 01/11/15 0428 01/11/15 1125  LATICACIDVEN 1.8 2.2* 1.8   ABG  Recent Labs Lab 01/14/15 0306 01/14/15 0407 01/14/15 1656  PHART 7.247* 7.267* 7.304*  PCO2ART 47.8* 43.3 43.2  PO2ART 56.0* 83.0 75.0*   Liver Enzymes  Recent Labs Lab 01/14/15 0230 01/15/15 0418 01/16/15 0500  AST 138* 135* 95*  ALT 54 53 45  ALKPHOS 444* 473* 472*  BILITOT 3.2* 2.8* 2.3*  ALBUMIN 1.6* 1.6* 1.7*    Cardiac Enzymes No results for input(s): TROPONINI, PROBNP in the last 168 hours. Glucose  Recent Labs Lab 01/15/15 1154 01/15/15 1633 01/15/15 2028 01/16/15 0013 01/16/15 0400 01/16/15 0817  GLUCAP 277* 216* 167* 122* 86 83    CXR: pending   ASSESSMENT / PLAN:  PULMONARY A: Acute hypoxemic respiratory failure   ARDS Legionella Pneumonia Former smoker Airflow obstruction Mucus plugging P:   Cont ARDS protocol Prone to 16 hrs then to flip and repeat abg He has improved with prone position likely to re require again for 16 hr pcxr now and in am  abg now Maintain rate 35 , high MV for now Goal cvp 4, lasix  INFECTIOUS A:   Legionella PNA Superinfection, MSSA P:   Will need 21 days levofloxacin  Bal to follow  CARDIOVASCULAR A:  Hx HTN P:  MAP goal > 60 mmHg  RENAL A:   Hyponatremia, resolved Hypokalemia ARDS P:   Chem in am  Lasix maintain k supp  GASTROINTESTINAL A:   H/O GERD, chronic PPI Elevated AST and alk phos> likely related to legionella Hyperbilirubinemia> ultrasound unremarkable Hx pancreatitis P:   SUP: IV Pantoprazole Monitor LFTs intermittently TF protocol 5/19 Get a post pyloric feeding tube when supine  HEMATOLOGIC A:   Mild thrombocytopenia P:  DVT px: SQ heparin  ENDOCRINE A:   DM 2, now controlled P:   TF off, lantus hold Cont Mod scale SSI  NEUROLOGIC A:   Chronic lower back pain Vent/ICU associated discomfort P:   Holding outpatient oxycodone, sertraline, gabapentin, Baclofen PAD protocol 5/19 - fent infusion, versed gtt Cont NMB, still required Continue baclofen to prevent withdrawal   Family updated: o family @ bedside 5/21  Interdisciplinary Family Meeting v Palliative Care Meeting:  Due by: 5/25  CC time:  35 minutes.  Lavon Paganini. Titus Mould, MD, [place] Pgr: [place] [facility]

## 2015-01-17 ENCOUNTER — Inpatient Hospital Stay (HOSPITAL_COMMUNITY): Payer: Medicare Other

## 2015-01-17 LAB — CBC WITH DIFFERENTIAL/PLATELET
BASOS PCT: 0 % (ref 0–1)
Basophils Absolute: 0 10*3/uL (ref 0.0–0.1)
Eosinophils Absolute: 0 10*3/uL (ref 0.0–0.7)
Eosinophils Relative: 0 % (ref 0–5)
HEMATOCRIT: 33.8 % — AB (ref 39.0–52.0)
HEMOGLOBIN: 11.3 g/dL — AB (ref 13.0–17.0)
LYMPHS ABS: 1.3 10*3/uL (ref 0.7–4.0)
Lymphocytes Relative: 11 % — ABNORMAL LOW (ref 12–46)
MCH: 32.5 pg (ref 26.0–34.0)
MCHC: 33.4 g/dL (ref 30.0–36.0)
MCV: 97.1 fL (ref 78.0–100.0)
MONO ABS: 1.2 10*3/uL — AB (ref 0.1–1.0)
Monocytes Relative: 10 % (ref 3–12)
Neutro Abs: 9.7 10*3/uL — ABNORMAL HIGH (ref 1.7–7.7)
Neutrophils Relative %: 79 % — ABNORMAL HIGH (ref 43–77)
Platelets: 185 10*3/uL (ref 150–400)
RBC: 3.48 MIL/uL — ABNORMAL LOW (ref 4.22–5.81)
RDW: 16.4 % — AB (ref 11.5–15.5)
WBC: 12.2 10*3/uL — AB (ref 4.0–10.5)

## 2015-01-17 LAB — URINALYSIS, ROUTINE W REFLEX MICROSCOPIC
Bilirubin Urine: NEGATIVE
GLUCOSE, UA: NEGATIVE mg/dL
HGB URINE DIPSTICK: NEGATIVE
KETONES UR: NEGATIVE mg/dL
Leukocytes, UA: NEGATIVE
Nitrite: NEGATIVE
Protein, ur: NEGATIVE mg/dL
SPECIFIC GRAVITY, URINE: 1.014 (ref 1.005–1.030)
Urobilinogen, UA: 1 mg/dL (ref 0.0–1.0)
pH: 5 (ref 5.0–8.0)

## 2015-01-17 LAB — COMPREHENSIVE METABOLIC PANEL
ALBUMIN: 1.7 g/dL — AB (ref 3.5–5.0)
ALK PHOS: 527 U/L — AB (ref 38–126)
ALT: 40 U/L (ref 17–63)
AST: 82 U/L — ABNORMAL HIGH (ref 15–41)
Anion gap: 9 (ref 5–15)
BUN: 56 mg/dL — AB (ref 6–20)
CO2: 29 mmol/L (ref 22–32)
Calcium: 8.5 mg/dL — ABNORMAL LOW (ref 8.9–10.3)
Chloride: 107 mmol/L (ref 101–111)
Creatinine, Ser: 1.17 mg/dL (ref 0.61–1.24)
GFR calc non Af Amer: >60 mL/min (ref >60)
GLUCOSE: 268 mg/dL — AB (ref 65–99)
Potassium: 3.4 mmol/L — ABNORMAL LOW (ref 3.5–5.1)
SODIUM: 145 mmol/L (ref 135–145)
TOTAL PROTEIN: 5.5 g/dL — AB (ref 6.5–8.1)
Total Bilirubin: 2.2 mg/dL — ABNORMAL HIGH (ref 0.3–1.2)

## 2015-01-17 LAB — GLUCOSE, CAPILLARY
GLUCOSE-CAPILLARY: 231 mg/dL — AB (ref 65–99)
GLUCOSE-CAPILLARY: 243 mg/dL — AB (ref 65–99)
Glucose-Capillary: 209 mg/dL — ABNORMAL HIGH (ref 65–99)
Glucose-Capillary: 260 mg/dL — ABNORMAL HIGH (ref 65–99)
Glucose-Capillary: 272 mg/dL — ABNORMAL HIGH (ref 65–99)
Glucose-Capillary: 295 mg/dL — ABNORMAL HIGH (ref 65–99)

## 2015-01-17 LAB — POCT I-STAT 3, ART BLOOD GAS (G3+)
Acid-Base Excess: 4 mmol/L — ABNORMAL HIGH (ref 0.0–2.0)
BICARBONATE: 28.4 meq/L — AB (ref 20.0–24.0)
O2 SAT: 93 %
TCO2: 30 mmol/L (ref 0–100)
pCO2 arterial: 42.9 mmHg (ref 35.0–45.0)
pH, Arterial: 7.431 (ref 7.350–7.450)
pO2, Arterial: 66 mmHg — ABNORMAL LOW (ref 80.0–100.0)

## 2015-01-17 LAB — CULTURE, RESPIRATORY W GRAM STAIN: Culture: NO GROWTH

## 2015-01-17 LAB — CULTURE, BLOOD (ROUTINE X 2)
CULTURE: NO GROWTH
CULTURE: NO GROWTH

## 2015-01-17 LAB — CULTURE, RESPIRATORY

## 2015-01-17 MED ORDER — ACETAMINOPHEN 160 MG/5ML PO SOLN
650.0000 mg | Freq: Once | ORAL | Status: AC
Start: 2015-01-17 — End: 2015-01-17
  Administered 2015-01-17: 650 mg
  Filled 2015-01-17: qty 20.3

## 2015-01-17 MED ORDER — POTASSIUM CHLORIDE 20 MEQ/15ML (10%) PO SOLN
20.0000 meq | ORAL | Status: AC
  Administered 2015-01-17 (×2): 20 meq
  Filled 2015-01-17 (×2): qty 15

## 2015-01-17 MED ORDER — INSULIN GLARGINE 100 UNIT/ML ~~LOC~~ SOLN
15.0000 [IU] | Freq: Every day | SUBCUTANEOUS | Status: DC
  Administered 2015-01-17 – 2015-01-24 (×8): 15 [IU] via SUBCUTANEOUS
  Filled 2015-01-17 (×9): qty 0.15

## 2015-01-17 NOTE — Progress Notes (Signed)
PULMONARY / CRITICAL CARE MEDICINE   Name: [name] MRN: [id number] DOB: 1960/12/23    ADMISSION DATE:  01/10/2015 CONSULTATION DATE:  01/17/2015  REFERRING MD :  EDP  CHIEF COMPLAINT:  SOB, cough  INITIAL PRESENTATION:   54 y.o. M presented to [facility] ED late 5/18 PM with acute hypoxic respiratory failure and severe bilateral infiltrates. Required intubation in AM 5/19. Treated as PNA, ARDS.   MAJOR EVENTS/TEST RESULTS: 5/18 Admit via ED to ICU/PCCM service 5/19 AM: intubated. ARDS protocol 5/19 RUQ [place] (elevated bilirubin): s/p chole, nothing acute 5/20 worsening gas exchange. Started on Nimbex. Prone ventilation. Vasopressors initiated 5/21 Off vasopressors./ Severe gas trapping. Very high PIPs. Quick look FOB > extensive thick secretions in proximal airway and partially obstructing tip of ETT. Secretions successfully suctioned and removed. PIPs markedly improved. Pt left in supine position after FOB. ARDS protocol. Vt reduced to 6 cc/kg IBW. Gas exchange much improved 5/23 bronch clean, proned, neg 3.3 liters 5/24- neg 2 liters  INDWELLING DEVICES:: ETT 5/19 >>  R IJ CVL 5/19 >>  R radial A-line 5/19 >>   MICRO DATA: MRSA PCR 5/18 >> NEG Blood 5/18 >> NEG Strep Ag 5/19 >> NEG Legionella Ag 5/19 >> POSITIVE BAL 5/19 >>  Flu panel (trach asp) 5/19 >> NEG RVP (trach asp) 5/19 >> NEG Legionella cx (trach asp) 5/19 >>   ANTIMICROBIALS:  Pip-tazo 5/18 X 1 Vanc 5/18 >> 5/20 Levofloxacin 5/19 >>    SUBJECTIVE:  Neg 2 liters, peep improved   VITAL SIGNS: Temp:  [99.6 F (37.6 C)-101.7 F (38.7 C)] 99.8 F (37.7 C) (05/25 0900) Pulse Rate:  [72-115] 74 (05/25 0900) Resp:  [22-35] 22 (05/25 0900) BP: (95-134)/(61-78) 118/62 mmHg (05/25 0900) SpO2:  [90 %-99 %] 99 % (05/25 0900) Arterial Line BP: (102-147)/(55-68) 139/66 mmHg (05/25 0900) FiO2 (%):  [50 %-60 %] 50 % (05/25 0800) Weight:  [85.1 kg (187 lb 9.8 oz)] 85.1 kg (187 lb 9.8 oz) (05/25  0130) HEMODYNAMICS: CVP:  [4 mmHg-13 mmHg] 5 mmHg VENTILATOR SETTINGS: Vent Mode:  [-] PRVC FiO2 (%):  [50 %-60 %] 50 % Set Rate:  [22 bmp-25 bmp] 25 bmp Vt Set:  [410 mL] 410 mL PEEP:  [10 cmH20] 10 cmH20 Plateau Pressure:  [19 cmH20-23 cmH20] 23 cmH20 INTAKE / OUTPUT: Intake/Output      05/24 0701 - 05/25 0700 05/25 0701 - 05/26 0700   I.V. (mL/kg) 1819 (21.4) 186.8 (2.2)   Other 10    NG/GT 855 140   IV Piggyback 350    Total Intake(mL/kg) 3034 (35.7) 326.8 (3.8)   Urine (mL/kg/hr) 4890 (2.4) 160 (0.6)   Emesis/NG output 30 (0)    Stool 0 (0) 0 (0)   Total Output 4920 160   Net -1886 +166.8        Stool Occurrence 1 x 1 x     PHYSICAL EXAMINATION: General: sedated, paralyzed HENT: line clean ETT in place PULM: rhonchi bilateral improved, bases distant CV:  s1 s2 RRR GI: BS+ low, soft, nontender Ext: low edema better Neuro: sedated, paralyzed on vent, rass -5  LABS:  CBC  Recent Labs Lab 01/15/15 0418 01/16/15 0500 01/17/15 0400  WBC 12.5* 15.3* 12.2*  HGB 11.9* 12.0* 11.3*  HCT 36.2* 36.0* 33.8*  PLT 147* 174 185   Coag's No results for input(s): APTT, INR in the last 168 hours. BMET  Recent Labs Lab 01/15/15 0418 01/16/15 0500 01/17/15 0400  NA 141 143 145  K 4.1  3.4* 3.4*  CL 111 107 107  CO2 _0 BUN 47* 50* 56*  CREATININE 1.08 1.07 1.17  GLUCOSE 249* 92 268*   Electrolytes  Recent Labs Lab 01/13/15 0420 01/14/15 0230 01/14/15 2033 01/15/15 0418 01/16/15 0500 01/17/15 0400  CALCIUM 8.1* 8.6*  --  8.7* 8.9 8.5*  MG 2.0 1.8  --  1.8  --   --   PHOS 1.7* 1.1* 2.6 3.2  --   --    Sepsis Markers  Recent Labs Lab 01/11/15 0155 01/11/15 0428 01/11/15 1125  LATICACIDVEN 1.8 2.2* 1.8   ABG  Recent Labs Lab 01/16/15 1720 01/16/15 2049 01/17/15 0405  PHART 7.426 7.373 7.431  PCO2ART 47.0* 52.8* 42.9  PO2ART 73.5* 85.0 66.0*   Liver Enzymes  Recent Labs Lab 01/15/15 0418 01/16/15 0500 01/17/15 0400  AST 135*  95* 82*  ALT 53 45 40  ALKPHOS 473* 472* 527*  BILITOT 2.8* 2.3* 2.2*  ALBUMIN 1.6* 1.7* 1.7*   Cardiac Enzymes No results for input(s): TROPONINI, PROBNP in the last 168 hours. Glucose  Recent Labs Lab 01/16/15 1206 01/16/15 1612 01/16/15 1917 01/16/15 2333 01/17/15 0344 01/17/15 0756  GLUCAP 102* 101* 156* 272* 231* 209*    CXR: improved int changes , infiltrates, ett wnl   ASSESSMENT / PLAN:  PULMONARY A: Acute hypoxemic respiratory failure   ARDS Legionella Pneumonia Former smoker Airflow obstruction Mucus plugging P:   Cont ARDS protocol, abg reviewed drop rate 18 Dc paralysis Peep 10 needed, goal ro 40%  pcxr in am  No prone needed, dc bed  INFECTIOUS A:   Legionella PNA Superinfection, MSSA P:   Levofloxacin to add stop date 21 days   Bal to follow - remains neg  CARDIOVASCULAR A:  Hx HTN P:  MAP goal > 60 mmHg Dc cvp Tolerating lasix  RENAL A:   Hyponatremia, resolved Hypokalemia ARDS Some rise crt / bun P:   Chem in am  Lasix maintain, reduce and dc after one more dos e k supp  GASTROINTESTINAL A:   H/O GERD, chronic PPI Elevated AST and alk phos> likely related to legionella Hyperbilirubinemia> ultrasound unremarkable Hx pancreatitis Post pyloric feeding P:   SUP: IV Pantoprazole Monitor LFTs intermittently TF protocol through post pyloric cmet in am for alk phos  HEMATOLOGIC A:   Mild thrombocytopenia P:  DVT px: SQ heparin  ENDOCRINE A:   DM 2, now controlled P:   TF on, restart lantus Cont Mod scale SSI  NEUROLOGIC A:   Chronic lower back pain Vent/ICU associated discomfort P:   Holding outpatient oxycodone, sertraline, gabapentin, Baclofen PAD protocol 5/19 - fent infusion, versed gtt, goal to reduce fent to 250, versed to 6 Dc NMB   Family updated: o family @ bedside 5/21  Interdisciplinary Family Meeting v Palliative Care Meeting:  Due by: 5/25  CC time:  30 minutes.  Lavon Paganini. Titus Mould,  MD, [place] Pgr: [place] [facility]

## 2015-01-17 NOTE — Progress Notes (Signed)
[facility] LAB RE[facility]ACEMENT ONLY  The patient does [facility] the [facility] based on the criteria listed below:   1. Is GFR >/= 40 ml/min? Yes.    Patient's GFR today is >60 2. Is urine output >/= 0.5 ml/kg/hr for the last 6 hours? Yes.   Patient's UOP is 1.8 ml/kg/hr 3. Is BUN < 60 mg/dL? Yes.    Patient's BUN today is 56 4. Abnormal electrolyte(s): K3.4 5. Ordered repletion with: per protocol 6. If a panic level lab has been reported, has the CCM MD in charge been notified? Yes.  .   Physician:  Laural BenesS Sommer, MD  Melrose NakayamaChisholm, Sydelle Sherfield William 01/17/2015 6:04 AM

## 2015-01-17 NOTE — Progress Notes (Signed)
Inpatient Diabetes Program Recommendations  AACE/ADA: New Consensus Statement on Inpatient Glycemic Control (2013)  Target Ranges:  Prepandial:   less than 140 mg/dL      Peak postprandial:   less than 180 mg/dL (1-2 hours)      Critically ill patients:  140 - 180 mg/dL  Glycemic Control Reviewed:  Results for Faythe GheeHARRISON, Matthan P (MRN [id number]) as of 01/17/2015 14:34  Ref. Range 01/16/2015 19:17 01/16/2015 23:33 01/17/2015 03:44 01/17/2015 07:56 01/17/2015 12:12  Glucose-Capillary Latest Ref Range: 65-99 mg/dL 130156 (H) 865272 (H) 784231 (H) 209 (H) 295 (H)    Note that CBG's decreased when tube feeds held.  Tube feeds now infusing and CBG's increased.  Note that Lantus decreased to 15 units q HS.    May consider increasing Lantus back to 15 units bid.  If CBG's remain greater than 200 mg/dL, please consider Phase 2 of ICU Glycemic Control-IV insulin.  Thanks, Beryl MeagerJenny Mong Neal, RN, BC-ADM Inpatient Diabetes Coordinator Pager [phone] (8a-5p)

## 2015-01-18 ENCOUNTER — Inpatient Hospital Stay (HOSPITAL_COMMUNITY): Payer: Medicare Other

## 2015-01-18 DIAGNOSIS — L8990 Pressure ulcer of unspecified site, unspecified stage: Secondary | ICD-10-CM | POA: Insufficient documentation

## 2015-01-18 LAB — HEPATIC FUNCTION PANEL
ALBUMIN: 1.8 g/dL — AB (ref 3.5–5.0)
ALT: 38 U/L (ref 17–63)
AST: 89 U/L — ABNORMAL HIGH (ref 15–41)
Alkaline Phosphatase: 459 U/L — ABNORMAL HIGH (ref 38–126)
BILIRUBIN DIRECT: 1.1 mg/dL — AB (ref 0.1–0.5)
Indirect Bilirubin: 1 mg/dL — ABNORMAL HIGH (ref 0.3–0.9)
TOTAL PROTEIN: 5.4 g/dL — AB (ref 6.5–8.1)
Total Bilirubin: 2.1 mg/dL — ABNORMAL HIGH (ref 0.3–1.2)

## 2015-01-18 LAB — POCT I-STAT 3, ART BLOOD GAS (G3+)
ACID-BASE EXCESS: 11 mmol/L — AB (ref 0.0–2.0)
ACID-BASE EXCESS: 7 mmol/L — AB (ref 0.0–2.0)
Bicarbonate: 33.2 mEq/L — ABNORMAL HIGH (ref 20.0–24.0)
Bicarbonate: 36.6 mEq/L — ABNORMAL HIGH (ref 20.0–24.0)
O2 SAT: 69 %
O2 Saturation: 94 %
PCO2 ART: 54.4 mmHg — AB (ref 35.0–45.0)
PCO2 ART: 56.5 mmHg — AB (ref 35.0–45.0)
PH ART: 7.421 (ref 7.350–7.450)
Patient temperature: 99.3
TCO2: 35 mmol/L (ref 0–100)
TCO2: 38 mmol/L (ref 0–100)
pH, Arterial: 7.395 (ref 7.350–7.450)
pO2, Arterial: 75 mmHg — ABNORMAL LOW (ref 80.0–100.0)
pO2, Arterial: 37 mmHg — CL (ref 80.0–100.0)

## 2015-01-18 LAB — CBC
HCT: 33.1 % — ABNORMAL LOW (ref 39.0–52.0)
HEMOGLOBIN: 10.1 g/dL — AB (ref 13.0–17.0)
MCH: 30.9 pg (ref 26.0–34.0)
MCHC: 30.5 g/dL (ref 30.0–36.0)
MCV: 101.2 fL — ABNORMAL HIGH (ref 78.0–100.0)
PLATELETS: 182 10*3/uL (ref 150–400)
RBC: 3.27 MIL/uL — AB (ref 4.22–5.81)
RDW: 16.9 % — ABNORMAL HIGH (ref 11.5–15.5)
WBC: 9.9 10*3/uL (ref 4.0–10.5)

## 2015-01-18 LAB — BASIC METABOLIC PANEL
Anion gap: 6 (ref 5–15)
BUN: 45 mg/dL — ABNORMAL HIGH (ref 6–20)
CO2: 33 mmol/L — ABNORMAL HIGH (ref 22–32)
CREATININE: 1.01 mg/dL (ref 0.61–1.24)
Calcium: 8.8 mg/dL — ABNORMAL LOW (ref 8.9–10.3)
Chloride: 111 mmol/L (ref 101–111)
GFR calc non Af Amer: >60 mL/min (ref >60)
GLUCOSE: 132 mg/dL — AB (ref 65–99)
Potassium: 3.2 mmol/L — ABNORMAL LOW (ref 3.5–5.1)
Sodium: 150 mmol/L — ABNORMAL HIGH (ref 135–145)

## 2015-01-18 LAB — GLUCOSE, CAPILLARY
GLUCOSE-CAPILLARY: 197 mg/dL — AB (ref 65–99)
Glucose-Capillary: 134 mg/dL — ABNORMAL HIGH (ref 65–99)
Glucose-Capillary: 161 mg/dL — ABNORMAL HIGH (ref 65–99)
Glucose-Capillary: 199 mg/dL — ABNORMAL HIGH (ref 65–99)
Glucose-Capillary: 225 mg/dL — ABNORMAL HIGH (ref 65–99)
Glucose-Capillary: 226 mg/dL — ABNORMAL HIGH (ref 65–99)
Glucose-Capillary: 302 mg/dL — ABNORMAL HIGH (ref 65–99)

## 2015-01-18 LAB — CLOSTRIDIUM DIFFICILE BY PCR: Toxigenic C. Difficile by PCR: NEGATIVE

## 2015-01-18 MED ORDER — FREE WATER
200.0000 mL | Freq: Three times a day (TID) | Status: DC
  Administered 2015-01-18 – 2015-01-21 (×11): 200 mL

## 2015-01-18 MED ORDER — POTASSIUM CHLORIDE 20 MEQ/15ML (10%) PO SOLN
40.0000 meq | ORAL | Status: AC
  Administered 2015-01-18 (×2): 40 meq
  Filled 2015-01-18 (×2): qty 30

## 2015-01-18 MED ORDER — METRONIDAZOLE IN NACL 5-0.79 MG/ML-% IV SOLN
500.0000 mg | Freq: Four times a day (QID) | INTRAVENOUS | Status: DC
  Administered 2015-01-18 – 2015-01-19 (×5): 500 mg via INTRAVENOUS
  Filled 2015-01-18 (×7): qty 100

## 2015-01-18 NOTE — Progress Notes (Signed)
PULMONARY / CRITICAL CARE MEDICINE   Name: [name] MRN: [id number] DOB: 19-Jun-1961    ADMISSION DATE:  01/10/2015 CONSULTATION DATE:  01/18/2015  REFERRING MD :  EDP  CHIEF COMPLAINT:  SOB, cough  INITIAL PRESENTATION:   54 y.o. M presented to [facility] ED late 5/18 PM with acute hypoxic respiratory failure and severe bilateral infiltrates. Required intubation in AM 5/19. Treated as PNA, ARDS.   MAJOR EVENTS/TEST RESULTS: 5/18 Admit via ED to ICU/PCCM service 5/19 AM: intubated. ARDS protocol 5/19 RUQ [place] (elevated bilirubin): s/p chole, nothing acute 5/20 worsening gas exchange. Started on Nimbex. Prone ventilation. Vasopressors initiated 5/21 Off vasopressors./ Severe gas trapping. Very high PIPs. Quick look FOB > extensive thick secretions in proximal airway and partially obstructing tip of ETT. Secretions successfully suctioned and removed. PIPs markedly improved. Pt left in supine position after FOB. ARDS protocol. Vt reduced to 6 cc/kg IBW. Gas exchange much improved 5/23 bronch clean, proned, neg 3.3 liters 5/24- neg 2 liters  INDWELLING DEVICES:: ETT 5/19 >>  R IJ CVL 5/19 >>  R radial A-line 5/19 >>   MICRO DATA: MRSA PCR 5/18 >> NEG Blood 5/18 >> NEG Strep Ag 5/19 >> NEG Legionella Ag 5/19 >> POSITIVE BAL 5/19 >>  Flu panel (trach asp) 5/19 >> NEG RVP (trach asp) 5/19 >> NEG Legionella cx (trach asp) 5/19 >>   ANTIMICROBIALS:  Pip-tazo 5/18 X 1 Vanc 5/18 >> 5/20 Levofloxacin 5/19 >>    SUBJECTIVE:  Bp ok, likely can reduce fio2  VITAL SIGNS: Temp:  [98.9 F (37.2 C)-102.3 F (39.1 C)] 99.1 F (37.3 C) (05/26 0500) Pulse Rate:  [72-104] 73 (05/26 0500) Resp:  [17-25] 24 (05/26 0500) BP: (102-150)/(60-74) 113/70 mmHg (05/26 0500) SpO2:  [90 %-99 %] 96 % (05/26 0500) Arterial Line BP: (102-182)/(54-69) 147/58 mmHg (05/26 0500) FiO2 (%):  [50 %] 50 % (05/26 0442) Weight:  [81.1 kg (178 lb 12.7 oz)] 81.1 kg (178 lb 12.7 oz) (05/26  0200) HEMODYNAMICS: CVP:  [5 mmHg] 5 mmHg VENTILATOR SETTINGS: Vent Mode:  [-] PRVC FiO2 (%):  [50 %] 50 % Set Rate:  [18 bmp-25 bmp] 18 bmp Vt Set:  [410 mL] 410 mL PEEP:  [10 cmH20] 10 cmH20 Plateau Pressure:  [19 cmH20-20 cmH20] 20 cmH20 INTAKE / OUTPUT: Intake/Output      05/25 0701 - 05/26 0700   I.V. (mL/kg) 1403.8 (17.3)   NG/GT 1550   IV Piggyback 150   Total Intake(mL/kg) 3103.8 (38.3)   Urine (mL/kg/hr) 3560 (1.8)   Stool 0 (0)   Total Output 3560   Net -456.2       Urine Occurrence 1 x   Stool Occurrence 4 x     PHYSICAL EXAMINATION: General: sedated, moving all ext nonpurpsoeful, perr 2 HENT: line clean ETT in place PULM: clear CV:  s1 s2 RRR GI: BS+ low, soft, nontender Ext: no edema better Neuro: sedated rass -2  LABS:  CBC  Recent Labs Lab 01/15/15 0418 01/16/15 0500 01/17/15 0400  WBC 12.5* 15.3* 12.2*  HGB 11.9* 12.0* 11.3*  HCT 36.2* 36.0* 33.8*  PLT 147* 174 185   Coag's No results for input(s): APTT, INR in the last 168 hours. BMET  Recent Labs Lab 01/15/15 0418 01/16/15 0500 01/17/15 0400  NA 141 143 145  K 4.1 3.4* 3.4*  CL 111 107 107  CO2 $Re'23 27 29  'AjO$ BUN 47* 50* 56*  CREATININE 1.08 1.07 1.17  GLUCOSE 249* 92 268*   Electrolytes  Recent Labs Lab 01/13/15 0420 01/14/15 0230 01/14/15 2033 01/15/15 0418 01/16/15 0500 01/17/15 0400  CALCIUM 8.1* 8.6*  --  8.7* 8.9 8.5*  MG 2.0 1.8  --  1.8  --   --   PHOS 1.7* 1.1* 2.6 3.2  --   --    Sepsis Markers  Recent Labs Lab 01/11/15 1125  LATICACIDVEN 1.8   ABG  Recent Labs Lab 01/17/15 0405 01/18/15 0436 01/18/15 0443  PHART 7.431 7.421 7.395  PCO2ART 42.9 56.5* 54.4*  PO2ART 66.0* 37.0* 75.0*   Liver Enzymes  Recent Labs Lab 01/15/15 0418 01/16/15 0500 01/17/15 0400  AST 135* 95* 82*  ALT 53 45 40  ALKPHOS 473* 472* 527*  BILITOT 2.8* 2.3* 2.2*  ALBUMIN 1.6* 1.7* 1.7*   Cardiac Enzymes No results for input(s): TROPONINI, PROBNP in the last 168  hours. Glucose  Recent Labs Lab 01/17/15 0756 01/17/15 1212 01/17/15 1541 01/17/15 1936 01/18/15 0005 01/18/15 0451  GLUCAP 209* 295* 243* 260* 302* 134*    CXR: pending   ASSESSMENT / PLAN:  PULMONARY A: Acute hypoxemic respiratory failure   ARDS Legionella Pneumonia Former smoker Airflow obstruction Mucus plugging P:   ards to continue Keep same MV, rate pao2 looks ok to reduce peep / fio2 Seems debilitated, consider trach pcxr in am  Consider lasix reduction / hold  INFECTIOUS A:   Legionella PNA Superinfection, MSSA Fever 5/25, increase stools P:   Levofloxacin to add stop date 21 days   Follow repeat BC Send cdiff Consider empiric flagyl  Dc aline Bal to follow - remains neg atx causing fever?, vent needs have improved  CARDIOVASCULAR A:  Hx HTN P:  MAP goal > 60 mmHg Dc cvp Tolerating lasix  RENAL A:   Hypernatremia Hypokalemia ARDS Some rise crt / bun P:   Chem in am  Lasix dc k supp Free water  GASTROINTESTINAL A:   H/O GERD, chronic PPI Elevated AST and alk phos> likely related to legionella Hyperbilirubinemia> ultrasound unremarkable Hx pancreatitis Post pyloric feeding P:   SUP: IV Pantoprazole Monitor LFTs intermittently TF protocol through post pyloric cmet  HEMATOLOGIC A:   Mild thrombocytopenia P:  DVT px: SQ heparin Get coags, possible trach  ENDOCRINE A:   DM 2, now controlled P:   TF on, restart lantus 15, low glu 134, no increase Cont Mod scale SSI  NEUROLOGIC A:   Chronic lower back pain Vent/ICU associated discomfort P:   Holding outpatient oxycodone, sertraline, gabapentin, Baclofen fent to goal 250, versed to goal 5 mg May need rispirdal  Family updated: o family @ bedside 5/21, called updated wife 5/25  Interdisciplinary Family Meeting v Palliative Care Meeting:  Due by: 5/25  CC time:  30 minutes.  Lavon Paganini. Titus Mould, MD, [place] Pgr: [place] [facility]

## 2015-01-18 NOTE — Progress Notes (Signed)
Pt had a brief desating issue lavaged with saline and suctioned several times bringing spo2 back from 79% up to 94% increased fio2 to 60% per ARDS protocol. Will continue to monitor.

## 2015-01-18 NOTE — Care Management Note (Signed)
Case Management Note  Patient Details  Name: [name] MRN: [id number] Date of Birth: 07/11/1961  Subjective/Objective:              Remains on vent - 50% - now supine     Action/Plan:   Expected Discharge Date:                  Expected Discharge Plan:     In-House Referral:     Discharge planning Services     Post Acute Care Choice:  Home Health Choice offered to:     DME Arranged:    DME Agency:     HH Arranged:    HH Agency:     Status of Service:     Medicare Important Message Given:    Date Medicare IM Given:    Medicare IM give by:    Date Additional Medicare IM Given:    Additional Medicare Important Message give by:     If discussed at Long Length of Stay Meetings, dates discussed:   01-16-15 01-18-15 Additional Comments:  [name], [name], RN 01/18/2015, 9:24 AM

## 2015-01-19 ENCOUNTER — Inpatient Hospital Stay (HOSPITAL_COMMUNITY): Payer: Medicare Other

## 2015-01-19 LAB — COMPREHENSIVE METABOLIC PANEL
ALBUMIN: 2 g/dL — AB (ref 3.5–5.0)
ALK PHOS: 396 U/L — AB (ref 38–126)
ALT: 38 U/L (ref 17–63)
ANION GAP: 7 (ref 5–15)
AST: 85 U/L — ABNORMAL HIGH (ref 15–41)
BUN: 26 mg/dL — AB (ref 6–20)
CALCIUM: 8.8 mg/dL — AB (ref 8.9–10.3)
CO2: 31 mmol/L (ref 22–32)
CREATININE: 0.86 mg/dL (ref 0.61–1.24)
Chloride: 109 mmol/L (ref 101–111)
GFR calc Af Amer: >60 mL/min (ref >60)
Glucose, Bld: 231 mg/dL — ABNORMAL HIGH (ref 65–99)
Potassium: 3.2 mmol/L — ABNORMAL LOW (ref 3.5–5.1)
Sodium: 147 mmol/L — ABNORMAL HIGH (ref 135–145)
Total Bilirubin: 2.5 mg/dL — ABNORMAL HIGH (ref 0.3–1.2)
Total Protein: 5.4 g/dL — ABNORMAL LOW (ref 6.5–8.1)

## 2015-01-19 LAB — GLUCOSE, CAPILLARY
Glucose-Capillary: 182 mg/dL — ABNORMAL HIGH (ref 65–99)
Glucose-Capillary: 164 mg/dL — ABNORMAL HIGH (ref 65–99)
Glucose-Capillary: 162 mg/dL — ABNORMAL HIGH (ref 65–99)
Glucose-Capillary: 265 mg/dL — ABNORMAL HIGH (ref 65–99)
Glucose-Capillary: 218 mg/dL — ABNORMAL HIGH (ref 65–99)

## 2015-01-19 LAB — CBC WITH DIFFERENTIAL/PLATELET
BASOS ABS: 0 10*3/uL (ref 0.0–0.1)
Basophils Relative: 0 % (ref 0–1)
EOS PCT: 0 % (ref 0–5)
Eosinophils Absolute: 0 10*3/uL (ref 0.0–0.7)
HEMATOCRIT: 31 % — AB (ref 39.0–52.0)
Hemoglobin: 9.8 g/dL — ABNORMAL LOW (ref 13.0–17.0)
Lymphocytes Relative: 14 % (ref 12–46)
Lymphs Abs: 1.3 10*3/uL (ref 0.7–4.0)
MCH: 31.9 pg (ref 26.0–34.0)
MCHC: 31.6 g/dL (ref 30.0–36.0)
MCV: 101 fL — ABNORMAL HIGH (ref 78.0–100.0)
MONO ABS: 0.7 10*3/uL (ref 0.1–1.0)
MONOS PCT: 7 % (ref 3–12)
NEUTROS ABS: 7.2 10*3/uL (ref 1.7–7.7)
NEUTROS PCT: 79 % — AB (ref 43–77)
PLATELETS: 189 10*3/uL (ref 150–400)
RBC: 3.07 MIL/uL — ABNORMAL LOW (ref 4.22–5.81)
RDW: 16.5 % — AB (ref 11.5–15.5)
WBC: 9.2 10*3/uL (ref 4.0–10.5)

## 2015-01-19 LAB — POCT I-STAT 3, ART BLOOD GAS (G3+)
Acid-Base Excess: 7 mmol/L — ABNORMAL HIGH (ref 0.0–2.0)
BICARBONATE: 30.4 meq/L — AB (ref 20.0–24.0)
O2 SAT: 98 %
Patient temperature: 99.6
TCO2: 32 mmol/L (ref 0–100)
pCO2 arterial: 40.4 mmHg (ref 35.0–45.0)
pH, Arterial: 7.487 — ABNORMAL HIGH (ref 7.350–7.450)
pO2, Arterial: 98 mmHg (ref 80.0–100.0)

## 2015-01-19 LAB — CLOSTRIDIUM DIFFICILE BY PCR: Toxigenic C. Difficile by PCR: NEGATIVE

## 2015-01-19 LAB — TRIGLYCERIDES: Triglycerides: 190 mg/dL — ABNORMAL HIGH (ref <150)

## 2015-01-19 MED ORDER — RISPERIDONE 1 MG/ML PO SOLN
0.5000 mg | Freq: Two times a day (BID) | ORAL | Status: DC
  Administered 2015-01-19 – 2015-01-21 (×5): 0.5 mg via ORAL
  Filled 2015-01-19 (×9): qty 0.5

## 2015-01-19 MED ORDER — PROPOFOL 1000 MG/100ML IV EMUL
5.0000 ug/kg/min | INTRAVENOUS | Status: DC
  Administered 2015-01-19: 20 ug/kg/min via INTRAVENOUS
  Administered 2015-01-19: 40 ug/kg/min via INTRAVENOUS
  Administered 2015-01-19 – 2015-01-21 (×5): 30 ug/kg/min via INTRAVENOUS
  Filled 2015-01-19 (×9): qty 100

## 2015-01-19 MED ORDER — POTASSIUM CHLORIDE 20 MEQ/15ML (10%) PO SOLN
30.0000 meq | ORAL | Status: AC
Start: 2015-01-19 — End: 2015-01-19
  Administered 2015-01-19 (×2): 30 meq
  Filled 2015-01-19: qty 22.5
  Filled 2015-01-19: qty 30
  Filled 2015-01-19: qty 22.5
  Filled 2015-01-19: qty 30

## 2015-01-19 MED ORDER — SERTRALINE HCL 100 MG PO TABS
100.0000 mg | ORAL_TABLET | Freq: Every day | ORAL | Status: DC
  Administered 2015-01-19 – 2015-01-25 (×6): 100 mg
  Filled 2015-01-19 (×8): qty 1

## 2015-01-19 MED ORDER — PANTOPRAZOLE SODIUM 40 MG PO PACK
40.0000 mg | PACK | Freq: Every day | ORAL | Status: DC
  Administered 2015-01-19 – 2015-01-21 (×3): 40 mg
  Filled 2015-01-19 (×5): qty 20

## 2015-01-19 MED ORDER — BACLOFEN 10 MG PO TABS
10.0000 mg | ORAL_TABLET | Freq: Three times a day (TID) | ORAL | Status: DC
  Administered 2015-01-19 – 2015-01-25 (×13): 10 mg
  Filled 2015-01-19 (×23): qty 1

## 2015-01-19 MED ORDER — GABAPENTIN 250 MG/5ML PO SOLN
1200.0000 mg | Freq: Three times a day (TID) | ORAL | Status: DC
  Administered 2015-01-19 – 2015-01-21 (×7): 1200 mg
  Filled 2015-01-19 (×13): qty 24

## 2015-01-19 NOTE — Progress Notes (Signed)
Notified by Dr Register that pt's ETT needs to be advanced 5 cm; Dr Sherrine MaplesGlenn notified;no orders received will continue to monitor.  Burnard BuntingKatrice Shewanda Sharpe, RN

## 2015-01-19 NOTE — Progress Notes (Signed)
PULMONARY / CRITICAL CARE MEDICINE   Name: [name] MRN: [id number] DOB: 03-30-1961    ADMISSION DATE:  01/10/2015 CONSULTATION DATE:  01/19/2015  REFERRING MD :  EDP  CHIEF COMPLAINT:  SOB, cough  INITIAL PRESENTATION:   54 y.o. M presented to [facility] ED late 5/18 PM with acute hypoxic respiratory failure and severe bilateral infiltrates. Required intubation in AM 5/19. Treated as PNA, ARDS.   MAJOR EVENTS/TEST RESULTS: 5/18 Admit via ED to ICU/PCCM service 5/19 AM: intubated. ARDS protocol 5/19 RUQ [place] (elevated bilirubin): s/p chole, nothing acute 5/20 worsening gas exchange. Started on Nimbex. Prone ventilation. Vasopressors initiated 5/21 Off vasopressors./ Severe gas trapping. Very high PIPs. Quick look FOB > extensive thick secretions in proximal airway and partially obstructing tip of ETT. Secretions successfully suctioned and removed. PIPs markedly improved. Pt left in supine position after FOB. ARDS protocol. Vt reduced to 6 cc/kg IBW. Gas exchange much improved 5/23 bronch clean, proned, neg 3.3 liters 5/24- neg 2 liters 5/26- fevers, diarrhea  5/27- agitation, more awake  INDWELLING DEVICES:: ETT 5/19 >>  R IJ CVL 5/19 >>  R radial A-line 5/19 >>5/26  MICRO DATA: MRSA PCR 5/18 >> NEG Blood 5/18 >> NEG Strep Ag 5/19 >> NEG Legionella Ag 5/19 >> POSITIVE BAL 5/19 >>  Flu panel (trach asp) 5/19 >> NEG RVP (trach asp) 5/19 >> NEG Legionella cx (trach asp) 5/19 >>   ANTIMICROBIALS:  Pip-tazo 5/18 X 1 Vanc 5/18 >> 5/20 Levofloxacin 5/19 >>    SUBJECTIVE:  cdiff neg x 2, fever curve lower, aline dc'ed  VITAL SIGNS: Temp:  [100.2 F (37.9 C)-101.8 F (38.8 C)] 100.4 F (38 C) (05/27 0600) Pulse Rate:  [79-112] 87 (05/27 0600) Resp:  [16-26] 16 (05/27 0600) BP: (110-169)/(63-118) 142/80 mmHg (05/27 0600) SpO2:  [89 %-99 %] 99 % (05/27 0815) Arterial Line BP: (155)/(67) 155/67 mmHg (05/26 1100) FiO2 (%):  [50 %-60 %] 50 % (05/27 0815) Weight:  [84.9 kg  (187 lb 2.7 oz)] 84.9 kg (187 lb 2.7 oz) (05/27 0151) HEMODYNAMICS:   VENTILATOR SETTINGS: Vent Mode:  [-] PRVC FiO2 (%):  [50 %-60 %] 50 % Set Rate:  [18 bmp] 18 bmp Vt Set:  [410 mL] 410 mL PEEP:  [8 cmH20-10 cmH20] 10 cmH20 Plateau Pressure:  [20 cmH20-22 cmH20] 20 cmH20 INTAKE / OUTPUT: Intake/Output      05/26 0701 - 05/27 0700 05/27 0701 - 05/28 0700   I.V. (mL/kg) 1134.6 (13.4) 156.3 (1.8)   Other 0    NG/GT 1110 195   IV Piggyback 450    Total Intake(mL/kg) 2694.6 (31.7) 351.3 (4.1)   Urine (mL/kg/hr) 2405 (1.2) 335 (1.3)   Emesis/NG output 0 (0)    Stool 200 (0.1)    Total Output 2605 335   Net +89.6 +16.3        Stool Occurrence 1 x      PHYSICAL EXAMINATION: General: sedated rass -3 just resedated HENT: line clean ETT in place PULM: clear to slight ronchi CV:  s1 s2 RRR GI: BS+ low, soft, nontender Ext: no edema Neuro: sedated rass -3 LABS:  CBC  Recent Labs Lab 01/17/15 0400 01/18/15 0457 01/19/15 0446  WBC 12.2* 9.9 9.2  HGB 11.3* 10.1* 9.8*  HCT 33.8* 33.1* 31.0*  PLT 185 182 189   Coag's No results for input(s): APTT, INR in the last 168 hours. BMET  Recent Labs Lab 01/17/15 0400 01/18/15 0457 01/19/15 0446  NA 145 150* 147*  K 3.4*  3.2* 3.2*  CL 107 111 109  CO2 29 33* 31  BUN 56* 45* 26*  CREATININE 1.17 1.01 0.86  GLUCOSE 268* 132* 231*   Electrolytes  Recent Labs Lab 01/13/15 0420 01/14/15 0230 01/14/15 2033 01/15/15 0418  01/17/15 0400 01/18/15 0457 01/19/15 0446  CALCIUM 8.1* 8.6*  --  8.7*  < > 8.5* 8.8* 8.8*  MG 2.0 1.8  --  1.8  --   --   --   --   PHOS 1.7* 1.1* 2.6 3.2  --   --   --   --   < > = values in this interval not displayed. Sepsis Markers No results for input(s): LATICACIDVEN, PROCALCITON, O2SATVEN in the last 168 hours. ABG  Recent Labs Lab 01/18/15 0436 01/18/15 0443 01/19/15 0431  PHART 7.421 7.395 7.487*  PCO2ART 56.5* 54.4* 40.4  PO2ART 37.0* 75.0* 98.0   Liver Enzymes  Recent  Labs Lab 01/17/15 0400 01/18/15 0457 01/19/15 0446  AST 82* 89* 85*  ALT 40 38 38  ALKPHOS 527* 459* 396*  BILITOT 2.2* 2.1* 2.5*  ALBUMIN 1.7* 1.8* 2.0*   Cardiac Enzymes No results for input(s): TROPONINI, PROBNP in the last 168 hours. Glucose  Recent Labs Lab 01/18/15 1123 01/18/15 1624 01/18/15 1921 01/18/15 2339 01/19/15 0355 01/19/15 0757  GLUCAP 226* 197* 199* 225* 182* 164*    CXR: improved rt infiltrate,. ett high ( advancing now)   ASSESSMENT / PLAN:  PULMONARY A: Acute hypoxemic respiratory failure   ARDS Legionella Pneumonia Former smoker Airflow obstruction Mucus plugging resolved P:   ARDS remain, improved pcxr and MV needs and fio2 Hope to avoid trach ABG reviewed , can reduce peep 8 then 5 likely, reduce rate 12 Will d/w Rt to make changes If need 60%  To get to peep 5 would do so , so can wean Use short acting propofol Even balance, no lasix, have maxed that  INFECTIOUS A:   Legionella PNA Superinfection, MSSA Fever 5/25, increase stools, cdiff neg x 2 UA neg P:   Levofloxacin to add stop date 21 days   Follow repeat BC, low threshold to dc line empiric flagyl- dc  Dc aline done 5/27 If PIV then dc line Bal to follow - remains neg  CARDIOVASCULAR A:  Hx HTN P:  MAP goal > 60 mmHg tele  RENAL A:   Hypernatremia Hypokalemia ARDS P:   Chem in am  No further lasix k supp Free water maintain  GASTROINTESTINAL A:   H/O GERD, chronic PPI Elevated AST and alk phos> likely related to legionella (improved) Hyperbilirubinemia> ultrasound unremarkable Hx pancreatitis Post pyloric feeding P:   SUP: IV Pantoprazole Monitor LFTs intermittently to see resolution alk phos TF protocol through post pyloric  HEMATOLOGIC A:   Mild thrombocytopenia P:  DVT px: SQ heparin Get coags, possible trach tues  If not extubated  ENDOCRINE A:   DM 2, now controlled P:   TF on, restart lantus 15, keep Cont Mod scale  SSI  NEUROLOGIC A:   Chronic lower back pain Vent/ICU associated discomfort P:   Holding outpatient oxycodone,  Start sertraline, gabapentin, Baclofen fent to goal 250, versed to goal 4 mg (orders placed to NOT GO ABOVE THIS) May need rispirdal, 0.5 bid Add prop  Family updated: o family @ bedside 5/21, called updated wife 5/25  Interdisciplinary Family Meeting v Palliative Care Meeting:  Due by: 5/25  CC time:  30 minutes.  Lavon Paganini. Titus Mould, MD, [facility] Pgr: [phone] [place]  Pulmonary & Critical Care

## 2015-01-19 NOTE — Progress Notes (Signed)
ANTIBIOTIC CONSULT NOTE  Pharmacy Consult for Levofloxacin Indication: HCAP - Legionella  + MSSA  Allergies  Allergen Reactions  . Ketorolac Tromethamine     Hives (toradol)    Patient Measurements: Height:  (172.7 cm) Weight: 187 lb 2.7 oz (84.9 kg) IBW/kg (Calculated) : 68.4   Vital Signs: Temp: 99.4 F (37.4 C) (05/27 1400) Temp Source: Core (Comment) (05/27 1200) BP: 109/66 mmHg (05/27 1400) Pulse Rate: 71 (05/27 1400) Intake/Output from previous day: 05/26 0701 - 05/27 0700 In: 2694.6 [I.V.:1134.6; NG/GT:1110; IV Piggyback:450] Out: 2605 [Urine:2405; Stool:200] Intake/Output from this shift: Total I/O In: 1230.7 [I.V.:435.7; NG/GT:795] Out: 1150 [Urine:1150]  Labs:  Recent Labs  01/17/15 0400 01/18/15 0457 01/19/15 0446  WBC 12.2* 9.9 9.2  HGB 11.3* 10.1* 9.8*  PLT 185 182 189  CREATININE 1.17 1.01 0.86   Estimated Creatinine Clearance: 106.6 mL/min (by C-G formula based on Cr of 0.86). No results for input(s): VANCOTROUGH, VANCOPEAK, VANCORANDOM, GENTTROUGH, GENTPEAK, GENTRANDOM, TOBRATROUGH, TOBRAPEAK, TOBRARND, AMIKACINPEAK, AMIKACINTROU, AMIKACIN in the last 72 hours.   Microbiology: Recent Results (from the past 720 hour(s))  Blood Culture (routine x 2)     Status: None   Collection Time: 01/10/15 11:29 PM  Result Value Ref Range Status   Specimen Description BLOOD LEFT HAND  Final   Special Requests BOTTLES DRAWN AEROBIC AND ANAEROBIC 5CC  Final   Culture   Final    NO GROWTH 5 DAYS Performed at [facility]    Report Status 01/17/2015 FINAL  Final  Blood Culture (routine x 2)     Status: None   Collection Time: 01/10/15 11:30 PM  Result Value Ref Range Status   Specimen Description BLOOD RIGHT HAND  Final   Special Requests BOTTLES DRAWN AEROBIC AND ANAEROBIC 5CC  Final   Culture   Final    NO GROWTH 5 DAYS Performed at [facility]    Report Status 01/17/2015 FINAL  Final  MRSA PCR Screening     Status: None   Collection Time: 01/11/15  1:40 AM  Result Value Ref Range Status   MRSA by PCR NEGATIVE NEGATIVE Final    Comment:        The GeneXpert MRSA Assay (FDA approved for NASAL specimens only), is one component of a comprehensive MRSA colonization surveillance program. It is not intended to diagnose MRSA infection nor to guide or monitor treatment for MRSA infections.   Culture, respiratory (NON-Expectorated)     Status: None   Collection Time: 01/11/15  7:00 AM  Result Value Ref Range Status   Specimen Description TRACHEAL ASPIRATE  Final   Special Requests NONE  Final   Gram Stain   Final    NO WBC SEEN NO SQUAMOUS EPITHELIAL CELLS SEEN NO ORGANISMS SEEN Performed at [facility]    Culture   Final    NO GROWTH 2 DAYS Performed at [facility]    Report Status 01/13/2015 FINAL  Final  Respiratory virus panel     Status: None   Collection Time: 01/11/15  7:00 AM  Result Value Ref Range Status   Source - RVPAN TRACHEAL ASPIRATE  Corrected   Respiratory Syncytial Virus A Negative Negative Final   Respiratory Syncytial Virus B Negative Negative Final   Influenza A Negative Negative Final   Influenza B Negative Negative Final   Parainfluenza 1 Negative Negative Final   Parainfluenza 2 Negative Negative Final   Parainfluenza 3 Negative Negative Final   Metapneumovirus Negative Negative Final  Rhinovirus Negative Negative Final   Adenovirus Negative Negative Final    Comment: (NOTE) Performed At: [facility] [address], [place] [id number] Mila Homer MD WU:[id number]   Respiratory virus panel     Status: None   Collection Time: 01/11/15  9:54 AM  Result Value Ref Range Status   Respiratory Syncytial Virus A Negative Negative Final   Respiratory Syncytial Virus B Negative Negative Final   Influenza A Negative Negative Final   Influenza B Negative Negative Final   Parainfluenza 1 Negative Negative Final   Parainfluenza 2 Negative  Negative Final   Parainfluenza 3 Negative Negative Final   Metapneumovirus Negative Negative Final   Rhinovirus Negative Negative Final   Adenovirus Negative Negative Final    Comment: (NOTE) Performed At: [facility] [address] [place], [place] [id number] Mila Homer MD VH:[id number]   Culture, respiratory (NON-Expectorated)     Status: None   Collection Time: 01/11/15 12:30 PM  Result Value Ref Range Status   Specimen Description BRONCHIAL ALVEOLAR LAVAGE  Final   Special Requests Normal  Final   Gram Stain   Final    RARE WBC PRESENT, PREDOMINANTLY PMN NO SQUAMOUS EPITHELIAL CELLS SEEN NO ORGANISMS SEEN Performed at [facility]    Culture   Final    FEW STAPHYLOCOCCUS AUREUS Note: RIFAMPIN AND GENTAMICIN SHOULD NOT BE USED AS SINGLE DRUGS FOR TREATMENT OF STAPH INFECTIONS. Performed at [facility]    Report Status 01/14/2015 FINAL  Final   Organism ID, Bacteria STAPHYLOCOCCUS AUREUS  Final      Susceptibility   Staphylococcus aureus - MIC*    CLINDAMYCIN <=0.25 SENSITIVE Sensitive     ERYTHROMYCIN 0.5 SENSITIVE Sensitive     GENTAMICIN <=0.5 SENSITIVE Sensitive     LEVOFLOXACIN <=0.12 SENSITIVE Sensitive     OXACILLIN 0.5 SENSITIVE Sensitive     PENICILLIN >=0.5 RESISTANT Resistant     RIFAMPIN <=0.5 SENSITIVE Sensitive     TRIMETH/SULFA <=10 SENSITIVE Sensitive     VANCOMYCIN <=0.5 SENSITIVE Sensitive     TETRACYCLINE <=1 SENSITIVE Sensitive     MOXIFLOXACIN <=0.25 SENSITIVE Sensitive     * FEW STAPHYLOCOCCUS AUREUS  Stat Gram stain     Status: None   Collection Time: 01/15/15  2:00 PM  Result Value Ref Range Status   Specimen Description TRACHEAL ASPIRATE  Final   Special Requests Normal  Final   Gram Stain   Final    RARE WBC PRESENT,BOTH PMN AND MONONUCLEAR NO ORGANISMS SEEN    Report Status 01/15/2015 FINAL  Final  Culture, respiratory (NON-Expectorated)     Status: None   Collection Time: 01/15/15  2:00 PM  Result  Value Ref Range Status   Specimen Description TRACHEAL ASPIRATE  Final   Special Requests NONE  Final   Gram Stain   Final    RARE WBC PRESENT,BOTH PMN AND MONONUCLEAR NO ORGANISMS SEEN Performed at [facility] Performed at [facility]    Culture   Final    NO GROWTH 2 DAYS Performed at [facility]    Report Status 01/17/2015 FINAL  Final  Culture, blood (routine x 2)     Status: None (Preliminary result)   Collection Time: 01/17/15  3:00 PM  Result Value Ref Range Status   Specimen Description BLOOD LEFT HAND  Final   Special Requests BOTTLES DRAWN AEROBIC ONLY 3CC  Final   Culture   Final  BLOOD CULTURE RECEIVED NO GROWTH TO DATE CULTURE WILL BE HELD FOR 5 DAYS BEFORE ISSUING A FINAL NEGATIVE REPORT [facility]    Report Status PENDING  Incomplete  Culture, blood (routine x 2)     Status: None (Preliminary result)   Collection Time: 01/17/15  3:20 PM  Result Value Ref Range Status   Specimen Description BLOOD RIGHT HAND  Final   Special Requests BOTTLES DRAWN AEROBIC ONLY 10CC  Final   Culture   Final           BLOOD CULTURE RECEIVED NO GROWTH TO DATE CULTURE WILL BE HELD FOR 5 DAYS BEFORE ISSUING A FINAL NEGATIVE REPORT [facility]    Report Status PENDING  Incomplete  Clostridium Difficile by PCR     Status: None   Collection Time: 01/18/15  5:55 AM  Result Value Ref Range Status   C difficile by pcr NEGATIVE NEGATIVE Final  Clostridium Difficile by PCR     Status: None   Collection Time: 01/18/15  6:00 PM  Result Value Ref Range Status   C difficile by pcr NEGATIVE NEGATIVE Final   Assessment: 54 y.o male presented to ED 5/18 PM with chief complaint of SOB and cough + productive cough, fever, and generalized weakness x 4 - 5 days. Found to be positive for legionella antigen as well as MSSA in BAL - both sensitive to Levofloxacin. Today is abx day #9 (planned for 21 days). WBC down to nl,  afebrile. SCr stable1.07 with est CrCl 18000mL/min.  Goal of Therapy:  Proper dosing based on hepatic and renal function  Plan:  Continue Levofloxacin 750mg  IV q24h Follow clinical progression and renal function  Christoper Fabianaron Takuma Cifelli, PharmD, BCPS Clinical pharmacist, pager [phone] 01/19/2015 2:33 PM

## 2015-01-19 NOTE — Progress Notes (Deleted)
Pt's C. Diff PCR result neg. Enteric precaution removed

## 2015-01-19 NOTE — Progress Notes (Signed)
[facility] ICU Electrolyte Replacement Protocol  Patient Name: [name] DOB: 1961-02-19 MRN: [id number]  Date of Service  01/19/2015   HPI/Events of Note    Recent Labs Lab 01/13/15 0420 01/14/15 0230 01/14/15 2033 01/15/15 0418 01/16/15 0500 01/17/15 0400 01/18/15 0457 01/19/15 0446  NA 136 139  --  141 143 145 150* 147*  K 3.1* 3.7  --  4.1 3.4* 3.4* 3.2* 3.2*  CL 105 108  --  111 107 107 111 109  CO2 27 23  --  $R'23 27 29 'yh$ 33* 31  GLUCOSE 223* 192*  --  249* 92 268* 132* 231*  BUN 34* 44*  --  47* 50* 56* 45* 26*  CREATININE 1.00 1.15  --  1.08 1.07 1.17 1.01 0.86  CALCIUM 8.1* 8.6*  --  8.7* 8.9 8.5* 8.8* 8.8*  MG 2.0 1.8  --  1.8  --   --   --   --   PHOS 1.7* 1.1* 2.6 3.2  --   --   --   --     Estimated Creatinine Clearance: 106.6 mL/min (by C-G formula based on Cr of 0.86).  Intake/Output      05/26 0701 - 05/27 0700   I.V. (mL/kg) 1033.6 (12.2)   Other 0   NG/GT 1000   IV Piggyback 450   Total Intake(mL/kg) 2483.6 (29.3)   Urine (mL/kg/hr) 2230 (1.1)   Emesis/NG output 0 (0)   Stool 200 (0.1)   Total Output 2430   Net +53.6       Stool Occurrence 1 x    - I/O DETAILED x24h    Total I/O In: 1503.3 [I.V.:473.3; NG/GT:780; IV Piggyback:250] Out: 1250 [Urine:1250] - I/O THIS SHIFT    ASSESSMENT   eICURN Interventions   Electrolyte protocol criteria met.  Replace per protocol.  MD notified   ASSESSMENT: MAJOR ELECTROLYTE    Lorene Dy 01/19/2015, 5:41 AM

## 2015-01-19 NOTE — Progress Notes (Signed)
Patients ETT found at 20cm at the lips. Per MD advance tube 4cm. Patients tube secured at 24cm at the lips with bilateral breath sounds. No audible leak. RN aware.

## 2015-01-20 ENCOUNTER — Inpatient Hospital Stay (HOSPITAL_COMMUNITY): Payer: Medicare Other

## 2015-01-20 LAB — BASIC METABOLIC PANEL
Anion gap: 8 (ref 5–15)
BUN: 25 mg/dL — ABNORMAL HIGH (ref 6–20)
CO2: 29 mmol/L (ref 22–32)
Calcium: 9.1 mg/dL (ref 8.9–10.3)
Chloride: 110 mmol/L (ref 101–111)
Creatinine, Ser: 0.78 mg/dL (ref 0.61–1.24)
GFR calc Af Amer: >60 mL/min (ref >60)
Glucose, Bld: 205 mg/dL — ABNORMAL HIGH (ref 65–99)
Potassium: 3.4 mmol/L — ABNORMAL LOW (ref 3.5–5.1)
SODIUM: 147 mmol/L — AB (ref 135–145)

## 2015-01-20 LAB — MAGNESIUM: MAGNESIUM: 1.5 mg/dL — AB (ref 1.7–2.4)

## 2015-01-20 LAB — GLUCOSE, CAPILLARY
GLUCOSE-CAPILLARY: 217 mg/dL — AB (ref 65–99)
GLUCOSE-CAPILLARY: 238 mg/dL — AB (ref 65–99)
GLUCOSE-CAPILLARY: 253 mg/dL — AB (ref 65–99)
GLUCOSE-CAPILLARY: 347 mg/dL — AB (ref 65–99)
Glucose-Capillary: 197 mg/dL — ABNORMAL HIGH (ref 65–99)
Glucose-Capillary: 249 mg/dL — ABNORMAL HIGH (ref 65–99)
Glucose-Capillary: 281 mg/dL — ABNORMAL HIGH (ref 65–99)

## 2015-01-20 LAB — PROTIME-INR
INR: 1.2 (ref 0.00–1.49)
Prothrombin Time: 15.4 seconds — ABNORMAL HIGH (ref 11.6–15.2)

## 2015-01-20 LAB — PHOSPHORUS: Phosphorus: 3.7 mg/dL (ref 2.5–4.6)

## 2015-01-20 LAB — TRIGLYCERIDES: TRIGLYCERIDES: 215 mg/dL — AB (ref <150)

## 2015-01-20 NOTE — Progress Notes (Signed)
PULMONARY / CRITICAL CARE MEDICINE   Name: [name] MRN: [id number] DOB: [birth year]    ADMISSION DATE:  01/10/2015 CONSULTATION DATE:  01/20/2015  REFERRING MD :  EDP  CHIEF COMPLAINT:  SOB, cough  INITIAL PRESENTATION:   54 y.o. M presented to [facility] ED late 5/18 PM with acute hypoxic respiratory failure and severe bilateral infiltrates. Required intubation in AM 5/19. Treated as PNA, ARDS.   MAJOR EVENTS/TEST RESULTS: 5/18 Admit via ED to ICU/PCCM service 5/19 AM: intubated. ARDS protocol 5/19 RUQ [place] (elevated bilirubin): s/p chole, nothing acute 5/20 worsening gas exchange. Started on Nimbex. Prone ventilation. Vasopressors initiated 5/21 Off vasopressors./ Severe gas trapping. Very high PIPs. Quick look FOB > extensive thick secretions in proximal airway and partially obstructing tip of ETT. Secretions successfully suctioned and removed. PIPs markedly improved. Pt left in supine position after FOB. ARDS protocol. Vt reduced to 6 cc/kg IBW. Gas exchange much improved 5/23 bronch clean, proned, neg 3.3 liters 5/24- neg 2 liters 5/26- fevers, diarrhea  5/27- agitation, more awake  INDWELLING DEVICES:: ETT 5/19 >>  R IJ CVL 5/19 >>  R radial A-line 5/19 >>5/26  MICRO DATA: MRSA PCR 5/18 >> NEG Blood 5/18 >> NEG Strep Ag 5/19 >> NEG Legionella Ag 5/19 >> POSITIVE BAL 5/19 >>  Flu panel (trach asp) 5/19 >> NEG RVP (trach asp) 5/19 >> NEG Legionella cx (trach asp) 5/19 >>   ANTIMICROBIALS:  Pip-tazo 5/18 X 1 Vanc 5/18 >> 5/20 Levofloxacin 5/19 >>    SUBJECTIVE:  Weaned this morning well  VITAL SIGNS: Temp:  [98.5 F (36.9 C)-100.5 F (38.1 C)] 98.8 F (37.1 C) (05/28 1300) Pulse Rate:  [61-103] 61 (05/28 1300) Resp:  [15-29] 18 (05/28 1300) BP: (87-134)/(52-76) 90/55 mmHg (05/28 1300) SpO2:  [88 %-96 %] 95 % (05/28 1300) FiO2 (%):  [40 %] 40 % (05/28 1300) Weight:  [83.3 kg (183 lb 10.3 oz)] 83.3 kg (183 lb 10.3 oz) (05/28 0300) HEMODYNAMICS:   VENTILATOR  SETTINGS: Vent Mode:  [-] PRVC FiO2 (%):  [40 %] 40 % Set Rate:  [14 bmp] 14 bmp Vt Set:  [410 mL] 410 mL PEEP:  [5 cmH20] 5 cmH20 Pressure Support:  [10 cmH20] 10 cmH20 Plateau Pressure:  [17 cmH20-22 cmH20] 17 cmH20 INTAKE / OUTPUT: Intake/Output      05/27 0701 - 05/28 0700 05/28 0701 - 05/29 0700   I.V. (mL/kg) 1226.3 (14.7) 245.7 (2.9)   Other     NG/GT 2340 330   IV Piggyback 150    Total Intake(mL/kg) 3716.3 (44.6) 575.7 (6.9)   Urine (mL/kg/hr) 3235 (1.6) 500 (0.8)   Emesis/NG output     Stool 900 (0.5) 150 (0.2)   Total Output 4135 650   Net -418.7 -74.3          PHYSICAL EXAMINATION: General: sedated heavily on vent HENT: NCAT ETT  PULM: few rhonchi bilaterally CV: RRR, no mgr GI: BS+,soft, nontender MSK; normal bulk and tone Neuro: sedated on vent, not following commands for me  LABS:  CBC  Recent Labs Lab 01/17/15 0400 01/18/15 0457 01/19/15 0446  WBC 12.2* 9.9 9.2  HGB 11.3* 10.1* 9.8*  HCT 33.8* 33.1* 31.0*  PLT 185 182 189   Coag's  Recent Labs Lab 01/20/15 0528  INR 1.20   BMET  Recent Labs Lab 01/18/15 0457 01/19/15 0446 01/20/15 0528  NA 150* 147* 147*  K 3.2* 3.2* 3.4*  CL 111 109 110  CO2 33* 31 29  BUN 45*  26* 25*  CREATININE 1.01 0.86 0.78  GLUCOSE 132* 231* 205*   Electrolytes  Recent Labs Lab 01/14/15 0230 01/14/15 2033 01/15/15 0418  01/18/15 0457 01/19/15 0446 01/20/15 0528  CALCIUM 8.6*  --  8.7*  < > 8.8* 8.8* 9.1  MG 1.8  --  1.8  --   --   --  1.5*  PHOS 1.1* 2.6 3.2  --   --   --  3.7  < > = values in this interval not displayed. Sepsis Markers No results for input(s): LATICACIDVEN, PROCALCITON, O2SATVEN in the last 168 hours. ABG  Recent Labs Lab 01/18/15 0436 01/18/15 0443 01/19/15 0431  PHART 7.421 7.395 7.487*  PCO2ART 56.5* 54.4* 40.4  PO2ART 37.0* 75.0* 98.0   Liver Enzymes  Recent Labs Lab 01/17/15 0400 01/18/15 0457 01/19/15 0446  AST 82* 89* 85*  ALT 40 38 38  ALKPHOS 527*  459* 396*  BILITOT 2.2* 2.1* 2.5*  ALBUMIN 1.7* 1.8* 2.0*   Cardiac Enzymes No results for input(s): TROPONINI, PROBNP in the last 168 hours. Glucose  Recent Labs Lab 01/19/15 1523 01/19/15 1936 01/20/15 0001 01/20/15 0305 01/20/15 0826 01/20/15 1144  GLUCAP 265* 218* 249* 253* 281* 217*    CXR 5/28 : aeration improving, ETT good position   ASSESSMENT / PLAN:  PULMONARY A: Acute hypoxemic respiratory failure   ARDS Legionella Pneumonia Former smoker Airflow obstruction Mucus plugging resolved P:   Daily SBT, then full support VAP bundle Daily CXR  INFECTIOUS A:   Legionella PNA Superinfection, MSSA Fever 5/25 >fever curve improved UA neg P:   Levofloxacin for 21 days   If PIV then dc line Bal to follow - remains neg  CARDIOVASCULAR A:  Hx HTN P:  MAP goal > 60 mmHg tele  RENAL A:   Hypernatremia Hypokalemia ARDS P:   Monitor BMET and UOP Replace electrolytes as needed Lasix x1 today  GASTROINTESTINAL A:   H/O GERD, chronic PPI Elevated AST and alk phos> likely related to legionella (improved) Hyperbilirubinemia> ultrasound unremarkable Hx pancreatitis Post pyloric feeding P:   SUP: IV Pantoprazole Monitor LFTs intermittently to see resolution alk phos TF protocol   HEMATOLOGIC A:   Mild thrombocytopenia P:  DVT px: SQ heparin Get coags, possible trach tues  If not extubated  ENDOCRINE A:   DM 2, now controlled P:   Lantus 15, keep Cont Mod scale SSI  NEUROLOGIC A:   Chronic lower back pain Vent/ICU associated discomfort P:   Holding outpatient oxycodone,  Start sertraline, gabapentin, Baclofen Risperdal, 0.5 bid Fentanyl/versed/propofol per PAD titrated to RASS -1  Family updated: o family @ bedside 5/21, called updated wife 5/25  Interdisciplinary Family Meeting v Palliative Care Meeting:  Due by: 5/25  CC time:  35 minutes.  Roselie Awkward, MD [facility] Pager: [phone] Cell: [phone] After 3pm or  if no response, call [phone]

## 2015-01-21 ENCOUNTER — Inpatient Hospital Stay (HOSPITAL_COMMUNITY): Payer: Medicare Other

## 2015-01-21 LAB — GLUCOSE, CAPILLARY
GLUCOSE-CAPILLARY: 91 mg/dL (ref 65–99)
GLUCOSE-CAPILLARY: 203 mg/dL — AB (ref 65–99)
Glucose-Capillary: 211 mg/dL — ABNORMAL HIGH (ref 65–99)
Glucose-Capillary: 188 mg/dL — ABNORMAL HIGH (ref 65–99)
Glucose-Capillary: 160 mg/dL — ABNORMAL HIGH (ref 65–99)

## 2015-01-21 LAB — BASIC METABOLIC PANEL
Anion gap: 8 (ref 5–15)
BUN: 23 mg/dL — ABNORMAL HIGH (ref 6–20)
CHLORIDE: 112 mmol/L — AB (ref 101–111)
CO2: 28 mmol/L (ref 22–32)
Calcium: 9.1 mg/dL (ref 8.9–10.3)
Creatinine, Ser: 0.72 mg/dL (ref 0.61–1.24)
GFR calc non Af Amer: >60 mL/min (ref >60)
Glucose, Bld: 134 mg/dL — ABNORMAL HIGH (ref 65–99)
Potassium: 3.2 mmol/L — ABNORMAL LOW (ref 3.5–5.1)
Sodium: 148 mmol/L — ABNORMAL HIGH (ref 135–145)

## 2015-01-21 LAB — CBC WITH DIFFERENTIAL/PLATELET
BASOS ABS: 0 10*3/uL (ref 0.0–0.1)
Basophils Relative: 0 % (ref 0–1)
Eosinophils Absolute: 0.1 10*3/uL (ref 0.0–0.7)
Eosinophils Relative: 2 % (ref 0–5)
HCT: 32.4 % — ABNORMAL LOW (ref 39.0–52.0)
Hemoglobin: 10 g/dL — ABNORMAL LOW (ref 13.0–17.0)
Lymphocytes Relative: 21 % (ref 12–46)
Lymphs Abs: 1.6 10*3/uL (ref 0.7–4.0)
MCH: 31.2 pg (ref 26.0–34.0)
MCHC: 30.9 g/dL (ref 30.0–36.0)
MCV: 100.9 fL — AB (ref 78.0–100.0)
MONOS PCT: 7 % (ref 3–12)
Monocytes Absolute: 0.5 10*3/uL (ref 0.1–1.0)
NEUTROS ABS: 5.2 10*3/uL (ref 1.7–7.7)
NEUTROS PCT: 70 % (ref 43–77)
Platelets: 211 10*3/uL (ref 150–400)
RBC: 3.21 MIL/uL — ABNORMAL LOW (ref 4.22–5.81)
RDW: 16.7 % — ABNORMAL HIGH (ref 11.5–15.5)
WBC: 7.5 10*3/uL (ref 4.0–10.5)

## 2015-01-21 MED ORDER — FENTANYL CITRATE (PF) 100 MCG/2ML IJ SOLN
12.5000 ug | INTRAMUSCULAR | Status: DC | PRN
  Administered 2015-01-21: 25 ug via INTRAVENOUS
  Filled 2015-01-21 (×2): qty 2

## 2015-01-21 MED ORDER — HALOPERIDOL LACTATE 5 MG/ML IJ SOLN
2.0000 mg | Freq: Four times a day (QID) | INTRAMUSCULAR | Status: DC | PRN
  Administered 2015-01-21 – 2015-01-22 (×2): 2 mg via INTRAVENOUS
  Filled 2015-01-21 (×2): qty 1

## 2015-01-21 MED ORDER — FENTANYL CITRATE (PF) 100 MCG/2ML IJ SOLN
12.5000 ug | INTRAMUSCULAR | Status: DC | PRN
  Administered 2015-01-21 – 2015-01-22 (×7): 25 ug via INTRAVENOUS
  Filled 2015-01-21 (×6): qty 2

## 2015-01-21 MED ORDER — CETYLPYRIDINIUM CHLORIDE 0.05 % MT LIQD
7.0000 mL | Freq: Two times a day (BID) | OROMUCOSAL | Status: DC
  Administered 2015-01-21 – 2015-01-25 (×7): 7 mL via OROMUCOSAL

## 2015-01-21 MED ORDER — CHLORHEXIDINE GLUCONATE 0.12 % MT SOLN
15.0000 mL | Freq: Two times a day (BID) | OROMUCOSAL | Status: DC
  Administered 2015-01-21 – 2015-01-25 (×8): 15 mL via OROMUCOSAL
  Filled 2015-01-21 (×9): qty 15

## 2015-01-21 MED ORDER — DEXMEDETOMIDINE HCL IN NACL 400 MCG/100ML IV SOLN
0.4000 ug/kg/h | INTRAVENOUS | Status: DC
  Administered 2015-01-21: 0.4 ug/kg/h via INTRAVENOUS
  Administered 2015-01-21: 0.8 ug/kg/h via INTRAVENOUS
  Administered 2015-01-21 – 2015-01-22 (×4): 1.5 ug/kg/h via INTRAVENOUS
  Administered 2015-01-22: 0.8 ug/kg/h via INTRAVENOUS
  Administered 2015-01-22: 1.2 ug/kg/h via INTRAVENOUS
  Administered 2015-01-22: 1 ug/kg/h via INTRAVENOUS
  Administered 2015-01-22: 1.5 ug/kg/h via INTRAVENOUS
  Filled 2015-01-21 (×7): qty 100
  Filled 2015-01-21 (×3): qty 50

## 2015-01-21 MED ORDER — POTASSIUM CHLORIDE 20 MEQ/15ML (10%) PO SOLN
30.0000 meq | ORAL | Status: AC
  Administered 2015-01-21 (×2): 30 meq
  Filled 2015-01-21 (×2): qty 22.5

## 2015-01-21 MED ORDER — BUDESONIDE 0.5 MG/2ML IN SUSP
0.5000 mg | Freq: Two times a day (BID) | RESPIRATORY_TRACT | Status: DC
Start: 2015-01-22 — End: 2015-01-25
  Administered 2015-01-22 – 2015-01-24 (×6): 0.5 mg via RESPIRATORY_TRACT
  Filled 2015-01-21 (×9): qty 2

## 2015-01-21 NOTE — Procedures (Signed)
Extubation Procedure Note  Patient Details:   Name: Faythe Gheearon P Pavlock DOB: 04/24/1961 MRN: [id number]   Airway Documentation:  Airway (Active)  Secured at (cm) 26 cm 01/21/2015 12:00 PM  Measured From Lips 01/21/2015 12:00 PM  Secured Location Left 01/21/2015 12:00 PM  Secured By [employer] Tube Holder 01/21/2015 12:00 PM  Tube Holder Repositioned Yes 01/21/2015 12:00 PM  Cuff Pressure (cm H2O) 26 cm H2O 01/21/2015  3:33 AM  Site Condition Dry 01/21/2015 12:00 PM    Evaluation  O2 sats: stable throughout Complications: No apparent complications Patient did tolerate procedure well. Bilateral Breath Sounds: Clear, Diminished Suctioning: Airway Yes.  Extubated per dr.'s orders. Placed on 4L [place]  Sats 94%.  Nefertiti Mohamad V 01/21/2015, 2:50 PM

## 2015-01-21 NOTE — Progress Notes (Signed)
[facility] FOR AM LAB REPLACEMENT ONLY  The patient does [facility] Protocol based on the criteria listed below:   1. Is GFR >/= 40 ml/min? Yes.    Patient's GFR today is >60 2. Is urine output >/= 0.5 ml/kg/hr for the last 6 hours? Yes.   Patient's UOP is 1.1 ml/kg/hr 3. Is BUN < 60 mg/dL? Yes.    Patient's BUN today is 23 4. Abnormal electrolyte(s):K 3.2 5. Ordered repletion with: per protocol 6. If a panic level lab has been reported, has the CCM MD in charge been notified? No..   Physician:    Markus DaftWHELAN, Christy Ehrsam A 01/21/2015 4:02 AM

## 2015-01-21 NOTE — Progress Notes (Signed)
Versed 50mg  bag. 20cc wasted witnessed by Julius BowelsFelicia Zavia Pullen, RN and Janice Coffinyesha Harvey RN

## 2015-01-21 NOTE — Progress Notes (Signed)
PULMONARY / CRITICAL CARE MEDICINE   Name: [name] MRN: [id number] DOB: 10/30/1960    ADMISSION DATE:  01/10/2015 CONSULTATION DATE:  01/21/2015  REFERRING MD :  EDP  CHIEF COMPLAINT:  SOB, cough  INITIAL PRESENTATION:   54 y.o. M presented to [facility] ED late 5/18 PM with acute hypoxic respiratory failure and severe bilateral infiltrates. Required intubation in AM 5/19. Treated as PNA, ARDS.   MAJOR EVENTS/TEST RESULTS: 5/18 Admit via ED to ICU/PCCM service 5/19 AM: intubated. ARDS protocol 5/19 RUQ [place] (elevated bilirubin): s/p chole, nothing acute 5/20 worsening gas exchange. Started on Nimbex. Prone ventilation. Vasopressors initiated 5/21 Off vasopressors./ Severe gas trapping. Very high PIPs. Quick look FOB > extensive thick secretions in proximal airway and partially obstructing tip of ETT. Secretions successfully suctioned and removed. PIPs markedly improved. Pt left in supine position after FOB. ARDS protocol. Vt reduced to 6 cc/kg IBW. Gas exchange much improved 5/23 bronch clean, proned, neg 3.3 liters 5/24- neg 2 liters 5/26- fevers, diarrhea  5/27- agitation, more awake  INDWELLING DEVICES:: ETT 5/19 >>  R IJ CVL 5/19 >>  R radial A-line 5/19 >>5/26  MICRO DATA: MRSA PCR 5/18 >> NEG Blood 5/18 >> NEG Strep Ag 5/19 >> NEG Legionella Ag 5/19 >> POSITIVE BAL 5/19 >>  Flu panel (trach asp) 5/19 >> NEG RVP (trach asp) 5/19 >> NEG Legionella cx (trach asp) 5/19 >>   ANTIMICROBIALS:  Pip-tazo 5/18 X 1 Vanc 5/18 >> 5/20 Levofloxacin 5/19 >>    SUBJECTIVE:  Weaned well again today (all day), following commands  VITAL SIGNS: Temp:  [99 F (37.2 C)-100.4 F (38 C)] 100 F (37.8 C) (05/29 1400) Pulse Rate:  [68-84] 77 (05/29 1400) Resp:  [16-28] 23 (05/29 1400) BP: (98-130)/(58-70) 111/62 mmHg (05/29 1400) SpO2:  [88 %-97 %] 96 % (05/29 1400) FiO2 (%):  [40 %] 40 % (05/29 1401) Weight:  [81.5 kg (179 lb 10.8 oz)] 81.5 kg (179 lb 10.8 oz) (05/29  0500) HEMODYNAMICS:   VENTILATOR SETTINGS: Vent Mode:  [-] PSV;CPAP FiO2 (%):  [40 %] 40 % Set Rate:  [14 bmp] 14 bmp Vt Set:  [410 mL] 410 mL PEEP:  [5 cmH20] 5 cmH20 Pressure Support:  [10 cmH20] 10 cmH20 Plateau Pressure:  [15 cmH20-20 cmH20] 15 cmH20 INTAKE / OUTPUT: Intake/Output      05/28 0701 - 05/29 0700 05/29 0701 - 05/30 0700   I.V. (mL/kg) 891.9 (10.9) 247.1 (3)   NG/GT 1540 385   IV Piggyback 150    Total Intake(mL/kg) 2581.9 (31.7) 632.1 (7.8)   Urine (mL/kg/hr) 2225 (1.1) 585 (1)   Emesis/NG output 0 (0)    Stool 750 (0.4) 475 (0.8)   Total Output 2975 1060   Net -393.1 -427.9          PHYSICAL EXAMINATION: General: awake, alert, interactive with me HENT: NCAT ETT  PULM: CTA B CV: RRR, no mgr GI: BS+,soft, nontender MSK; normal bulk and tone Neuro: awake and alert, gives me a fist pound on command  LABS:  CBC  Recent Labs Lab 01/18/15 0457 01/19/15 0446 01/21/15 0245  WBC 9.9 9.2 7.5  HGB 10.1* 9.8* 10.0*  HCT 33.1* 31.0* 32.4*  PLT 182 189 211   Coag's  Recent Labs Lab 01/20/15 0528  INR 1.20   BMET  Recent Labs Lab 01/19/15 0446 01/20/15 0528 01/21/15 0245  NA 147* 147* 148*  K 3.2* 3.4* 3.2*  CL 109 110 112*  CO2 31 29 28  BUN 26* 25* 23*  CREATININE 0.86 0.78 0.72  GLUCOSE 231* 205* 134*   Electrolytes  Recent Labs Lab 01/14/15 2033 01/15/15 0418  01/19/15 0446 01/20/15 0528 01/21/15 0245  CALCIUM  --  8.7*  < > 8.8* 9.1 9.1  MG  --  1.8  --   --  1.5*  --   PHOS 2.6 3.2  --   --  3.7  --   < > = values in this interval not displayed. Sepsis Markers No results for input(s): LATICACIDVEN, PROCALCITON, O2SATVEN in the last 168 hours. ABG  Recent Labs Lab 01/18/15 0436 01/18/15 0443 01/19/15 0431  PHART 7.421 7.395 7.487*  PCO2ART 56.5* 54.4* 40.4  PO2ART 37.0* 75.0* 98.0   Liver Enzymes  Recent Labs Lab 01/17/15 0400 01/18/15 0457 01/19/15 0446  AST 82* 89* 85*  ALT 40 38 38  ALKPHOS 527* 459*  396*  BILITOT 2.2* 2.1* 2.5*  ALBUMIN 1.7* 1.8* 2.0*   Cardiac Enzymes No results for input(s): TROPONINI, PROBNP in the last 168 hours. Glucose  Recent Labs Lab 01/20/15 1614 01/20/15 1944 01/20/15 2343 01/21/15 0345 01/21/15 0826 01/21/15 1237  GLUCAP 197* 238* 347* 91 211* 203*    CXR 5/29 : aeration improving, ETT good position   ASSESSMENT / PLAN:  PULMONARY A: Acute hypoxemic respiratory failure  > improved greatly ARDS > improved Legionella Pneumonia Former smoker Airflow obstruction Mucus plugging resolved P:   Extubate Incentive spirometry Flutter SLP eval prior to eating VAP bundle Daily CXR  INFECTIOUS A:   Legionella PNA Superinfection, MSSA Fever 5/25 >fever curve improved UA neg P:   Levofloxacin for 21 days    CARDIOVASCULAR A:  Hx HTN P:  MAP goal > 60 mmHg Tele  RENAL A:   Hypernatremia Hypokalemia P:   Monitor BMET and UOP Replace electrolytes as needed   GASTROINTESTINAL A:   H/O GERD, chronic PPI Elevated AST and alk phos> likely related to legionella (improved) Hyperbilirubinemia> ultrasound unremarkable Hx pancreatitis Post pyloric feeding P:   SUP: IV Pantoprazole TF protocol   HEMATOLOGIC A:   Mild thrombocytopenia P:  DVT px: SQ heparin Get coags, possible trach tues  If not extubated  ENDOCRINE A:   DM 2, now controlled P:   Lantus 15, keep Cont Mod scale SSI  NEUROLOGIC A:   Chronic lower back pain P:   Stop sedation for extubation Prn fentanyl post extubation Restart sertraline, gabapentin, Baclofen 5/30 if swallowing Risperdal, 0.5 bid   Family updated: wife updated bedside by me 5/29  Interdisciplinary Family Meeting v Palliative Care Meeting:  Due by: 5/25  CC time:  35 minutes.  Roselie Awkward, MD [place] [facility] Pager: [phone] Cell: [phone] After 3pm or if no response, call [phone]

## 2015-01-21 NOTE — Progress Notes (Signed)
eLink Physician-Brief Progress Note Patient Name: [name] DOB: 10/27/1960 MRN: [id number]   Date of Service  01/21/2015  HPI/Events of Note  Severe delirium persists.  eICU Interventions  Will order: 1. Please give Fentanyl PRN that is already ordered.  2. Increase the Precedex maximum dose to 1.5 mcg/kg/hour.      Intervention Category Major Interventions: Delirium, psychosis, severe agitation - evaluation and management  Sommer,Steven Eugene 01/21/2015, 6:06 PM

## 2015-01-21 NOTE — Progress Notes (Signed)
eLink Physician-Brief Progress Note Patient Name: Faythe Gheearon P Cravens DOB: 01/04/1961 MRN: [id number]   Date of Service  01/21/2015  HPI/Events of Note  Delirium persists, however, somewhat improved. Now on Precedex IV infusion and Fentanyl 12.5 to 25 mcg IV Q 2 hours PRN.   eICU Interventions  Will increase Fentanyl to Q 1 hour PRN.      Intervention Category Major Interventions: Delirium, psychosis, severe agitation - evaluation and management  [place] Eugene 01/21/2015, 7:16 PM

## 2015-01-21 NOTE — Progress Notes (Signed)
eLink Physician-Brief Progress Note Patient Name: [name] DOB: 06/16/1961 MRN: [id number]   Date of Service  01/21/2015  HPI/Events of Note  Severe delirium s/p stopping all sedatives for extubation.   eICU Interventions  Will start Precedex IV infusion and titrate to RASS = 0.     Intervention Category Major Interventions: Delirium, psychosis, severe agitation - evaluation and management  Trevin Gartrell Eugene 01/21/2015, 4:13 PM

## 2015-01-21 NOTE — Progress Notes (Signed)
eLink Physician-Brief Progress Note Patient Name: [name] DOB: 01/24/1961 MRN: [id number]   Date of Service  01/21/2015  HPI/Events of Note  Nurse request soft waist belt restraint.  eICU Interventions  Will order soft waist belt restraint.      Intervention Category Minor Interventions: Routine modifications to care plan (e.g. PRN medications for pain, fever)  Sommer,Steven Eugene 01/21/2015, 10:10 PM

## 2015-01-21 NOTE — Progress Notes (Signed)
eLink Physician-Brief Progress Note Patient Name: [name] DOB: 09/13/1960 MRN: [id number]   Date of Service  01/21/2015  HPI/Events of Note  NGT d/ced. On Resperidal PO.  eICU Interventions  Will order: 1. D/C Resperidal PO 2. Haldol 2 mg IV Q 6 hours PRN.  3. Monitor Qtc interval.     Intervention Category Minor Interventions: Routine modifications to care plan (e.g. PRN medications for pain, fever)  Sommer,Steven Eugene 01/21/2015, 9:18 PM

## 2015-01-21 NOTE — Progress Notes (Signed)
Spoke with e-link doctor about patient pulling out his NG tube earlier in the day, and how with the current level of agitation I do not feel like the patient would tolerate another NG tube and is not coherent enough to take PO medications. Haldol was substituted for risperidone.  Doctor notified that the other PO medications will not be given.

## 2015-01-22 DIAGNOSIS — G9340 Encephalopathy, unspecified: Secondary | ICD-10-CM

## 2015-01-22 LAB — CBC WITH DIFFERENTIAL/PLATELET
BASOS PCT: 1 % (ref 0–1)
Basophils Absolute: 0 10*3/uL (ref 0.0–0.1)
Eosinophils Absolute: 0 10*3/uL (ref 0.0–0.7)
Eosinophils Relative: 1 % (ref 0–5)
HCT: 33.6 % — ABNORMAL LOW (ref 39.0–52.0)
Hemoglobin: 10.6 g/dL — ABNORMAL LOW (ref 13.0–17.0)
Lymphocytes Relative: 18 % (ref 12–46)
Lymphs Abs: 1.2 10*3/uL (ref 0.7–4.0)
MCH: 31.4 pg (ref 26.0–34.0)
MCHC: 31.5 g/dL (ref 30.0–36.0)
MCV: 99.4 fL (ref 78.0–100.0)
Monocytes Absolute: 0.4 10*3/uL (ref 0.1–1.0)
Monocytes Relative: 6 % (ref 3–12)
Neutro Abs: 4.8 10*3/uL (ref 1.7–7.7)
Neutrophils Relative %: 74 % (ref 43–77)
PLATELETS: 227 10*3/uL (ref 150–400)
RBC: 3.38 MIL/uL — AB (ref 4.22–5.81)
RDW: 16.1 % — AB (ref 11.5–15.5)
WBC: 6.5 10*3/uL (ref 4.0–10.5)

## 2015-01-22 LAB — HEPATIC FUNCTION PANEL
ALT: 32 U/L (ref 17–63)
AST: 62 U/L — ABNORMAL HIGH (ref 15–41)
Albumin: 2.2 g/dL — ABNORMAL LOW (ref 3.5–5.0)
Alkaline Phosphatase: 329 U/L — ABNORMAL HIGH (ref 38–126)
BILIRUBIN TOTAL: 1.8 mg/dL — AB (ref 0.3–1.2)
Bilirubin, Direct: 0.8 mg/dL — ABNORMAL HIGH (ref 0.1–0.5)
Indirect Bilirubin: 1 mg/dL — ABNORMAL HIGH (ref 0.3–0.9)
TOTAL PROTEIN: 6.6 g/dL (ref 6.5–8.1)

## 2015-01-22 LAB — GLUCOSE, CAPILLARY
GLUCOSE-CAPILLARY: 93 mg/dL (ref 65–99)
GLUCOSE-CAPILLARY: 185 mg/dL — AB (ref 65–99)
GLUCOSE-CAPILLARY: 191 mg/dL — AB (ref 65–99)
Glucose-Capillary: 134 mg/dL — ABNORMAL HIGH (ref 65–99)
Glucose-Capillary: 180 mg/dL — ABNORMAL HIGH (ref 65–99)
Glucose-Capillary: 272 mg/dL — ABNORMAL HIGH (ref 65–99)
Glucose-Capillary: 297 mg/dL — ABNORMAL HIGH (ref 65–99)

## 2015-01-22 LAB — BASIC METABOLIC PANEL
Anion gap: 9 (ref 5–15)
BUN: 21 mg/dL — ABNORMAL HIGH (ref 6–20)
CHLORIDE: 111 mmol/L (ref 101–111)
CO2: 28 mmol/L (ref 22–32)
CREATININE: 0.89 mg/dL (ref 0.61–1.24)
Calcium: 9.1 mg/dL (ref 8.9–10.3)
GFR calc Af Amer: >60 mL/min (ref >60)
Glucose, Bld: 117 mg/dL — ABNORMAL HIGH (ref 65–99)
POTASSIUM: 4 mmol/L (ref 3.5–5.1)
Sodium: 148 mmol/L — ABNORMAL HIGH (ref 135–145)

## 2015-01-22 MED ORDER — RESOURCE THICKENUP CLEAR PO POWD
ORAL | Status: DC | PRN
  Filled 2015-01-22: qty 125

## 2015-01-22 MED ORDER — GABAPENTIN 300 MG PO CAPS
300.0000 mg | ORAL_CAPSULE | Freq: Three times a day (TID) | ORAL | Status: DC
  Administered 2015-01-22 – 2015-01-25 (×7): 300 mg via ORAL
  Filled 2015-01-22 (×10): qty 1

## 2015-01-22 MED ORDER — HALOPERIDOL LACTATE 5 MG/ML IJ SOLN
1.0000 mg | Freq: Four times a day (QID) | INTRAMUSCULAR | Status: DC | PRN
  Administered 2015-01-22: 2 mg via INTRAVENOUS
  Filled 2015-01-22: qty 1

## 2015-01-22 MED ORDER — GABAPENTIN 250 MG/5ML PO SOLN
300.0000 mg | Freq: Three times a day (TID) | ORAL | Status: DC
  Administered 2015-01-22: 300 mg
  Filled 2015-01-22 (×3): qty 6

## 2015-01-22 NOTE — Progress Notes (Signed)
PULMONARY / CRITICAL CARE MEDICINE   Name: [name] MRN: [id number] DOB: 1961-02-16    ADMISSION DATE:  01/10/2015 CONSULTATION DATE:  01/22/2015  REFERRING MD :  EDP  CHIEF COMPLAINT:  SOB, cough  INITIAL PRESENTATION:   54 y.o. M presented to [facility] ED late 5/18 PM with acute hypoxic respiratory failure and severe bilateral infiltrates. Required intubation in AM 5/19. Treated as PNA, ARDS.   MAJOR EVENTS/TEST RESULTS: 5/18 Admit via ED to ICU/PCCM service 5/19 AM: intubated. ARDS protocol 5/19 RUQ [place] (elevated bilirubin): s/p chole, nothing acute 5/20 worsening gas exchange. Started on Nimbex. Prone ventilation. Vasopressors initiated 5/21 Off vasopressors./ Severe gas trapping. Very high PIPs. Quick look FOB > extensive thick secretions in proximal airway and partially obstructing tip of ETT. Secretions successfully suctioned and removed. PIPs markedly improved. Pt left in supine position after FOB. ARDS protocol. Vt reduced to 6 cc/kg IBW. Gas exchange much improved 5/23 bronch clean, proned, neg 3.3 liters 5/24- neg 2 liters 5/26- fevers, diarrhea  5/27- agitation, more awake 5/29 extubated  INDWELLING DEVICES:: ETT 5/19 >>  R IJ CVL 5/19 >>  R radial A-line 5/19 >>5/26  MICRO DATA: MRSA PCR 5/18 >> NEG Blood 5/18 >> NEG Strep Ag 5/19 >> NEG Legionella Ag 5/19 >> POSITIVE BAL 5/19 >>  Flu panel (trach asp) 5/19 >> NEG RVP (trach asp) 5/19 >> NEG Legionella cx (trach asp) 5/19 >>   ANTIMICROBIALS:  Pip-tazo 5/18 X 1 Vanc 5/18 >> 5/20 Levofloxacin 5/19 >>    SUBJECTIVE:  Extubated, delirius but breathing OK Put on precedex Receiving prn fentanyl, haldol  VITAL SIGNS: Temp:  [99.6 F (37.6 C)-101.6 F (38.7 C)] 100.5 F (38.1 C) (05/30 0600) Pulse Rate:  [48-87] 48 (05/30 0600) Resp:  [16-39] 28 (05/30 0600) BP: (98-172)/(58-89) 158/80 mmHg (05/30 0600) SpO2:  [90 %-98 %] 93 % (05/30 0600) FiO2 (%):  [40 %-55 %] 55 % (05/30 0600) Weight:  [81.2 kg (179  lb 0.2 oz)] 81.2 kg (179 lb 0.2 oz) (05/30 0500) HEMODYNAMICS:   VENTILATOR SETTINGS: Vent Mode:  [-] PSV;CPAP FiO2 (%):  [40 %-55 %] 55 % PEEP:  [5 cmH20] 5 cmH20 Pressure Support:  [10 cmH20] 10 cmH20 INTAKE / OUTPUT: Intake/Output      05/29 0701 - 05/30 0700 05/30 0701 - 05/31 0700   I.V. (mL/kg) 759.8 (9.4)    NG/GT 385    IV Piggyback 150    Total Intake(mL/kg) 1294.8 (15.9)    Urine (mL/kg/hr) 1565 (0.8) 50 (0.9)   Emesis/NG output     Stool 675 (0.3)    Total Output 2240 50   Net -945.2 -50          PHYSICAL EXAMINATION: General: awake, alert, confused HENT: NCAT  PULM: CTA B CV: RRR, no mgr GI: BS+,soft, nontender MSK; normal bulk and tone Neuro: awake and alert, answering questions, inattentive however  LABS:  CBC  Recent Labs Lab 01/19/15 0446 01/21/15 0245 01/22/15 0215  WBC 9.2 7.5 6.5  HGB 9.8* 10.0* 10.6*  HCT 31.0* 32.4* 33.6*  PLT 189 211 227   Coag's  Recent Labs Lab 01/20/15 0528  INR 1.20   BMET  Recent Labs Lab 01/20/15 0528 01/21/15 0245 01/22/15 0215  NA 147* 148* 148*  K 3.4* 3.2* 4.0  CL 110 112* 111  CO2 $Re'29 28 28  'QDN$ BUN 25* 23* 21*  CREATININE 0.78 0.72 0.89  GLUCOSE 205* 134* 117*   Electrolytes  Recent Labs Lab 01/20/15 0528 01/21/15  0245 01/22/15 0215  CALCIUM 9.1 9.1 9.1  MG 1.5*  --   --   PHOS 3.7  --   --    Sepsis Markers No results for input(s): LATICACIDVEN, PROCALCITON, O2SATVEN in the last 168 hours. ABG  Recent Labs Lab 01/18/15 0436 01/18/15 0443 01/19/15 0431  PHART 7.421 7.395 7.487*  PCO2ART 56.5* 54.4* 40.4  PO2ART 37.0* 75.0* 98.0   Liver Enzymes  Recent Labs Lab 01/18/15 0457 01/19/15 0446 01/22/15 0215  AST 89* 85* 62*  ALT 38 38 32  ALKPHOS 459* 396* 329*  BILITOT 2.1* 2.5* 1.8*  ALBUMIN 1.8* 2.0* 2.2*   Cardiac Enzymes No results for input(s): TROPONINI, PROBNP in the last 168 hours. Glucose  Recent Labs Lab 01/21/15 0345 01/21/15 0826 01/21/15 1237  01/21/15 1641 01/21/15 1947 01/22/15 0310  GLUCAP 91 211* 203* 188* 160* 93    CXR 5/29 : aeration improving, ETT good position   ASSESSMENT / PLAN:  PULMONARY A: Acute hypoxemic respiratory failure  > improved greatly ARDS > improved Legionella Pneumonia Former smoker Airflow obstruction Mucus plugging resolved P:   Out of bed, chair  Incentive spirometry Flutter SLP eval prior to eating Oral protocol CXR prn  INFECTIOUS A:   Legionella PNA Superinfection, MSSA Fever 5/25 >fever persistent, but no new sign of infection, lines out, suspect legionella related, WBC OK UA neg P:   Levofloxacin for 21 days    CARDIOVASCULAR A:  Hx HTN P:  MAP goal > 60 mmHg Tele  RENAL A:   Hypernatremia Hypokalemia P:   Monitor BMET and UOP Replace electrolytes as needed   GASTROINTESTINAL A:   H/O GERD, chronic PPI Elevated AST and alk phos> likely related to legionella (improved) Hyperbilirubinemia> ultrasound unremarkable Hx pancreatitis  P:    SLP eval today, may need Panda if fails  HEMATOLOGIC A:   Mild thrombocytopenia P:  DVT px: SQ heparin Get coags, possible trach tues  If not extubated  ENDOCRINE A:   DM 2, now controlled P:   Lantus 15, keep Cont Mod scale SSI  NEUROLOGIC A:   Chronic lower back pain Encephalopathy/delirium> new/worse 5/30, somewhat controlled on precedex P:   Prn fentanyl post extubation for pain Precedex for severe agitation Limit Restraints as able, but use to prevent fall Haldol PRN, follow QTc Restart sertraline, decrease dose gabapentin, continue Baclofen 5/30 when swallowing Risperdal, 0.5 bid when swallowing   Family updated: wife updated bedside by me 5/29  Interdisciplinary Family Meeting v Palliative Care Meeting:  Due by: 5/25  Maintain in ICU   Roselie Awkward, MD [place] [facility] Pager: [phone] Cell: [phone] After 3pm or if no response, call [phone]

## 2015-01-22 NOTE — Evaluation (Signed)
Physical Therapy Evaluation Patient Details Name: [name] MRN: [id number] DOB: Apr 16, 1961 Today's Date: 01/22/2015   History of Present Illness  Pt is a 54 y/o male who presented to [facility] with acute hypoxic respiratory failure and severe bilateral infiltrates. Pt was intubated 5/19-5/29. Pt was admitted for treatment of PNA and ARDS.  Clinical Impression  Pt admitted with above diagnosis. Pt currently with functional limitations due to the deficits listed below (see PT Problem List). At the time of PT eval pt was able to perform transfers with +2 assist for balance and support as well as safety. Pt anxious and impulsive at times, and posey belt was reapplied at end of session in recliner. Pt was not able to provide many details of the home environment and PLOF, however it is likely that pt was independent PTA. Feel that pt would benefit from an OT eval acutely, and further physical therapy at d/c. Pt will benefit from skilled PT to increase their independence and safety with mobility to allow discharge to the venue listed below.       Follow Up Recommendations CIR;Supervision/Assistance - 24 hour    Equipment Recommendations  Other (comment) (TBD by progress with PT)    Recommendations for Other Services Rehab consult     Precautions / Restrictions Precautions Precautions: Fall Precaution Comments: Posey belt Restrictions Weight Bearing Restrictions: No      Mobility  Bed Mobility Overal bed mobility: Needs Assistance Bed Mobility: Supine to Sit     Supine to sit: Min guard     General bed mobility comments: Close guard for safety. Pt initially trying to climb over the bed rails. Required hands-on guarding for trunk stability as he transitioned to full sitting position.   Transfers Overall transfer level: Needs assistance Equipment used: 2 person hand held assist Transfers: Sit to/from [employer] Sit to Stand: Min assist;+2 physical assistance Stand  pivot transfers: Min assist;+2 physical assistance       General transfer comment: +2 support required for balance and support as pt stood and pivoted over to the recliner chair. Pt is impulsive with initiating transfers.   Ambulation/Gait             General Gait Details: Deferred at this time for safety.   Stairs            Wheelchair Mobility    Modified Rankin (Stroke Patients Only)       Balance Overall balance assessment: Needs assistance Sitting-balance support: Feet supported;No upper extremity supported Sitting balance-Leahy Scale: Poor Sitting balance - Comments: Requires UE support at this time to maintain seated balance.    Standing balance support: Bilateral upper extremity supported Standing balance-Leahy Scale: Poor Standing balance comment: +2 assist required to maintain standing balance at this time.                              Pertinent Vitals/Pain Pain Assessment: No/denies pain    Home Living Family/patient expects to be discharged to:: Private residence Living Arrangements: Spouse/significant other Available Help at Discharge: Family             Additional Comments: Pt was not able to provide many details of home environment other than that he lives with his wife. Unclear whether she is available 24 hours.     Prior Function Level of Independence: Independent               Hand Dominance  Dominant Hand: Right    Extremity/Trunk Assessment   Upper Extremity Assessment: Defer to OT evaluation           Lower Extremity Assessment: Generalized weakness      Cervical / Trunk Assessment: Normal  Communication   Communication: Other (comment) (Voice soft and hoarse from intubation)  Cognition Arousal/Alertness: Awake/alert Behavior During Therapy: Restless;Anxious;Impulsive Overall Cognitive Status: Difficult to assess                      General Comments      Exercises         Assessment/Plan    PT Assessment Patient needs continued PT services  PT Diagnosis Difficulty walking;Generalized weakness   PT Problem List Decreased strength;Decreased range of motion;Decreased activity tolerance;Decreased balance;Decreased mobility;Decreased knowledge of use of DME;Decreased safety awareness;Decreased knowledge of precautions;Cardiopulmonary status limiting activity  PT Treatment Interventions DME instruction;Gait training;Stair training;Functional mobility training;Therapeutic activities;Therapeutic exercise;Neuromuscular re-education;Patient/family education   PT Goals (Current goals can be found in the Care Plan section) Acute Rehab PT Goals Patient Stated Goal: Pt did not state goals at this time.  PT Goal Formulation: With patient Time For Goal Achievement: 02/05/15 Potential to Achieve Goals: Good    Frequency Min 3X/week   Barriers to discharge   Unclear at this time what type of assist pt will have at home    Co-evaluation               End of Session Equipment Utilized During Treatment: Gait belt;Oxygen Activity Tolerance: Patient tolerated treatment well Patient left: in chair;with call bell/phone within reach;with restraints reapplied Nurse Communication: Mobility status         Time: [id number] PT Time Calculation (min) (ACUTE ONLY): 30 min   Charges:   PT Evaluation $Initial PT Evaluation Tier I: 1 Procedure PT Treatments $Therapeutic Activity: 8-22 mins   PT G Codes:        Conni SlipperKirkman, Querida Beretta 01/22/2015, 9:57 AM   Conni SlipperLaura Leonia Heatherly, PT, DPT Acute Rehabilitation Services Pager: [phone]

## 2015-01-22 NOTE — Progress Notes (Signed)
Precedex gtt was titrated down to facilitate the 400 neuro check.  Patient continues to be oriented to self and place, disoriented to situation and time.  Patient continuously trying to get out of bed and pulling oxygen off face.  Will titrate precedex gtt back up to 1.5.

## 2015-01-22 NOTE — Progress Notes (Signed)
Rehab Admissions Coordinator Note:  Patient was screened by Trish MageLogue, Gerardo Caiazzo M for appropriateness for an Inpatient Acute Rehab Consult.  At this time, we are recommending Inpatient Rehab consult.  Lelon FrohlichLogue, Steadman Prosperi M 01/22/2015, 10:44 AM  I can be reached at [phone].

## 2015-01-22 NOTE — Progress Notes (Signed)
Patient placed on high flow nasal cannula per Dr. Kendrick FriesMcQuaid.  Currently tolerating well.  Will continue to monitor.

## 2015-01-22 NOTE — Procedures (Signed)
Objective Swallowing Evaluation: Other (Comment) (FEES)  Patient Details  Name: [name] MRN: [id number] Date of Birth: Apr 05, 1961  Today's Date: 01/22/2015 Time: SLP Start Time (ACUTE ONLY): 1130-SLP Stop Time (ACUTE ONLY): 1200 SLP Time Calculation (min) (ACUTE ONLY): 30 min  Past Medical History:  Past Medical History  Diagnosis Date  . Hypertension   . GERD (gastroesophageal reflux disease)   . Osteoporosis   . Migraine   . Chronic lower back pain   . Vitamin D deficiency   . Hepatomegaly   . Pancreatitis   . Diabetes mellitus   . Fracture acetabulum-closed 04/09/2013   Past Surgical History:  Past Surgical History  Procedure Laterality Date  . Cholecystectomy    . Hernia repair    . Laminectomy    . Back surgery     HPI:  Other Pertinent Information: 54 y.o. M presented to [facility] ED late 5/18 PM with acute hypoxic respiratory failure and severe bilateral infiltrates. Required intubation in AM 5/19. Treated as legionella PNA, ARDS. Extubated 5/29.   No Data Recorded  Assessment / Plan / Recommendation CHL IP CLINICAL IMPRESSIONS 01/22/2015  Therapy Diagnosis Moderate oral phase dysphagia;Moderate pharyngeal phase dysphagia  Clinical Impression Pt presents with an acute reversible dysphagia following 11 day intubation with edematous laryngeal tissues, mild standing secretions and motor sensory deficits leading to delayed swallow and residuals. The pt did not penetrate or aspirate during the study, but as the study progressed he became increasingly lethargic and less participatory with slower swallow response. After exam ended pt became more alert. Recommend a conservative diet of dys 1 (puree) with pudding thick liquids to allow intake of PO and meds without alternative nutrition. Expect significant improvement in function with healing of laryngeal tissue and improved mentation. Trial nectar at bedside. Will follow for tolerance.       CHL IP TREATMENT RECOMMENDATION  01/22/2015  Treatment Recommendations Therapy as outlined in treatment plan below     CHL IP DIET RECOMMENDATION 01/22/2015  SLP Diet Recommendations Dysphagia 1 (Puree);Pudding  Liquid Administration via (None)  Medication Administration Crushed with puree  Compensations Multiple dry swallows after each bite/sip  Postural Changes and/or Swallow Maneuvers (None)     CHL IP OTHER RECOMMENDATIONS 01/22/2015  Recommended Consults (None)  Oral Care Recommendations Oral care BID  Other Recommendations Have oral suction available     No flowsheet data found.   CHL IP FREQUENCY AND DURATION 01/22/2015  Speech Therapy Frequency (ACUTE ONLY) min 2x/week  Treatment Duration 2 weeks     Pertinent Vitals/Pain NA    SLP Swallow Goals No flowsheet data found.  No flowsheet data found.    CHL IP REASON FOR REFERRAL 01/22/2015  Reason for Referral Objectively evaluate swallowing function     CHL IP ORAL PHASE 01/22/2015  Lips (None)  Tongue (None)  Mucous membranes (None)  Nutritional status (None)  Other (None)  Oxygen therapy (None)  Oral Phase Impaired  Oral - Pudding Teaspoon (None)  Oral - Pudding Cup (None)  Oral - Honey Teaspoon (None)  Oral - Honey Cup (None)  Oral - Honey Syringe (None)  Oral - Nectar Teaspoon (None)  Oral - Nectar Cup (None)  Oral - Nectar Straw (None)  Oral - Nectar Syringe (None)  Oral - Ice Chips (None)  Oral - Thin Teaspoon (None)  Oral - Thin Cup (None)  Oral - Thin Straw (None)  Oral - Thin Syringe (None)  Oral - Puree (None)  Oral - Mechanical Soft (None)  Oral - Regular (None)  Oral - Multi-consistency (None)  Oral - Pill (None)  Oral Phase - Comment (None)      CHL IP PHARYNGEAL PHASE 01/22/2015  Pharyngeal Phase Impaired  Pharyngeal - Pudding Teaspoon (None)  Penetration/Aspiration details (pudding teaspoon) (None)  Pharyngeal - Pudding Cup (None)  Penetration/Aspiration details (pudding cup) (None)  Pharyngeal - Honey Teaspoon (None)   Penetration/Aspiration details (honey teaspoon) (None)  Pharyngeal - Honey Cup (None)  Penetration/Aspiration details (honey cup) (None)  Pharyngeal - Honey Syringe (None)  Penetration/Aspiration details (honey syringe) (None)  Pharyngeal - Nectar Teaspoon (None)  Penetration/Aspiration details (nectar teaspoon) (None)  Pharyngeal - Nectar Cup (None)  Penetration/Aspiration details (nectar cup) (None)  Pharyngeal - Nectar Straw (None)  Penetration/Aspiration details (nectar straw) (None)  Pharyngeal - Nectar Syringe (None)  Penetration/Aspiration details (nectar syringe) (None)  Pharyngeal - Ice Chips (None)  Penetration/Aspiration details (ice chips) (None)  Pharyngeal - Thin Teaspoon (None)  Penetration/Aspiration details (thin teaspoon) (None)  Pharyngeal - Thin Cup (None)  Penetration/Aspiration details (thin cup) (None)  Pharyngeal - Thin Straw (None)  Penetration/Aspiration details (thin straw) (None)  Pharyngeal - Thin Syringe (None)  Penetration/Aspiration details (thin syringe') (None)  Pharyngeal - Puree (None)  Penetration/Aspiration details (puree) (None)  Pharyngeal - Mechanical Soft (None)  Penetration/Aspiration details (mechanical soft) (None)  Pharyngeal - Regular (None)  Penetration/Aspiration details (regular) (None)  Pharyngeal - Multi-consistency (None)  Penetration/Aspiration details (multi-consistency) (None)  Pharyngeal - Pill (None)  Penetration/Aspiration details (pill) (None)  Pharyngeal Comment (None)      No flowsheet data found.  No flowsheet data found.        Harlon DittyBonnie Zackari Ruane, MA CCC-SLP [phone]  Claudine MoutonDeBlois, Kessie Croston Caroline 01/22/2015, 1:04 PM

## 2015-01-22 NOTE — Progress Notes (Signed)
ANTIBIOTIC CONSULT NOTE  Pharmacy Consult for Levofloxacin Indication: HCAP - Legionella  + MSSA  Allergies  Allergen Reactions  . Ketorolac Tromethamine     Hives (toradol)    Patient Measurements: Height:  (172.7 cm) Weight: 179 lb 0.2 oz (81.2 kg) IBW/kg (Calculated) : 68.4   Vital Signs: Temp: 100.3 F (37.9 C) (05/30 0800) BP: 157/83 mmHg (05/30 0800) Pulse Rate: 51 (05/30 0800) Intake/Output from previous day: 05/29 0701 - 05/30 0700 In: 1294.8 [I.V.:759.8; NG/GT:385; IV Piggyback:150] Out: 2240 [Urine:1565; Stool:675] Intake/Output from this shift: Total I/O In: 59 [I.V.:59] Out: 50 [Urine:50]  Labs:  Recent Labs  01/20/15 0528 01/21/15 0245 01/22/15 0215  WBC  --  7.5 6.5  HGB  --  10.0* 10.6*  PLT  --  211 227  CREATININE 0.78 0.72 0.89   Estimated Creatinine Clearance: 93.9 mL/min (by C-G formula based on Cr of 0.89). No results for input(s): VANCOTROUGH, VANCOPEAK, VANCORANDOM, GENTTROUGH, GENTPEAK, GENTRANDOM, TOBRATROUGH, TOBRAPEAK, TOBRARND, AMIKACINPEAK, AMIKACINTROU, AMIKACIN in the last 72 hours.   Microbiology: Recent Results (from the past 720 hour(s))  Blood Culture (routine x 2)     Status: None   Collection Time: 01/10/15 11:29 PM  Result Value Ref Range Status   Specimen Description BLOOD LEFT HAND  Final   Special Requests BOTTLES DRAWN AEROBIC AND ANAEROBIC 5CC  Final   Culture   Final    NO GROWTH 5 DAYS Performed at [facility]    Report Status 01/17/2015 FINAL  Final  Blood Culture (routine x 2)     Status: None   Collection Time: 01/10/15 11:30 PM  Result Value Ref Range Status   Specimen Description BLOOD RIGHT HAND  Final   Special Requests BOTTLES DRAWN AEROBIC AND ANAEROBIC 5CC  Final   Culture   Final    NO GROWTH 5 DAYS Performed at [facility]    Report Status 01/17/2015 FINAL  Final  MRSA PCR Screening     Status: None   Collection Time: 01/11/15  1:40 AM  Result Value Ref Range Status   MRSA by PCR NEGATIVE NEGATIVE Final    Comment:        The GeneXpert MRSA Assay (FDA approved for NASAL specimens only), is one component of a comprehensive MRSA colonization surveillance program. It is not intended to diagnose MRSA infection nor to guide or monitor treatment for MRSA infections.   Culture, respiratory (NON-Expectorated)     Status: None   Collection Time: 01/11/15  7:00 AM  Result Value Ref Range Status   Specimen Description TRACHEAL ASPIRATE  Final   Special Requests NONE  Final   Gram Stain   Final    NO WBC SEEN NO SQUAMOUS EPITHELIAL CELLS SEEN NO ORGANISMS SEEN Performed at [facility]    Culture   Final    NO GROWTH 2 DAYS Performed at [facility]    Report Status 01/13/2015 FINAL  Final  Respiratory virus panel     Status: None   Collection Time: 01/11/15  7:00 AM  Result Value Ref Range Status   Source - RVPAN TRACHEAL ASPIRATE  Corrected   Respiratory Syncytial Virus A Negative Negative Final   Respiratory Syncytial Virus B Negative Negative Final   Influenza A Negative Negative Final   Influenza B Negative Negative Final   Parainfluenza 1 Negative Negative Final   Parainfluenza 2 Negative Negative Final   Parainfluenza 3 Negative Negative Final   Metapneumovirus Negative Negative Final  Rhinovirus Negative Negative Final   Adenovirus Negative Negative Final    Comment: (NOTE) Performed At: [facility] [address] [place], [place] [id number] Mila HomerHancock William F MD EX:[id number]:[phone]   Respiratory virus panel     Status: None   Collection Time: 01/11/15  9:54 AM  Result Value Ref Range Status   Respiratory Syncytial Virus A Negative Negative Final   Respiratory Syncytial Virus B Negative Negative Final   Influenza A Negative Negative Final   Influenza B Negative Negative Final   Parainfluenza 1 Negative Negative Final   Parainfluenza 2 Negative Negative Final   Parainfluenza 3 Negative Negative Final    Metapneumovirus Negative Negative Final   Rhinovirus Negative Negative Final   Adenovirus Negative Negative Final    Comment: (NOTE) Performed At: [facility] [address] [place], [place] [id number] Mila HomerHancock William F MD [id number]:[phone]   Culture, respiratory (NON-Expectorated)     Status: None   Collection Time: 01/11/15 12:30 PM  Result Value Ref Range Status   Specimen Description BRONCHIAL ALVEOLAR LAVAGE  Final   Special Requests Normal  Final   Gram Stain   Final    RARE WBC PRESENT, PREDOMINANTLY PMN NO SQUAMOUS EPITHELIAL CELLS SEEN NO ORGANISMS SEEN Performed at [facility]    Culture   Final    FEW STAPHYLOCOCCUS AUREUS Note: RIFAMPIN AND GENTAMICIN SHOULD NOT BE USED AS SINGLE DRUGS FOR TREATMENT OF STAPH INFECTIONS. Performed at [facility]    Report Status 01/14/2015 FINAL  Final   Organism ID, Bacteria STAPHYLOCOCCUS AUREUS  Final      Susceptibility   Staphylococcus aureus - MIC*    CLINDAMYCIN <=0.25 SENSITIVE Sensitive     ERYTHROMYCIN 0.5 SENSITIVE Sensitive     GENTAMICIN <=0.5 SENSITIVE Sensitive     LEVOFLOXACIN <=0.12 SENSITIVE Sensitive     OXACILLIN 0.5 SENSITIVE Sensitive     PENICILLIN >=0.5 RESISTANT Resistant     RIFAMPIN <=0.5 SENSITIVE Sensitive     TRIMETH/SULFA <=10 SENSITIVE Sensitive     VANCOMYCIN <=0.5 SENSITIVE Sensitive     TETRACYCLINE <=1 SENSITIVE Sensitive     MOXIFLOXACIN <=0.25 SENSITIVE Sensitive     * FEW STAPHYLOCOCCUS AUREUS  Stat Gram stain     Status: None   Collection Time: 01/15/15  2:00 PM  Result Value Ref Range Status   Specimen Description TRACHEAL ASPIRATE  Final   Special Requests Normal  Final   Gram Stain   Final    RARE WBC PRESENT,BOTH PMN AND MONONUCLEAR NO ORGANISMS SEEN    Report Status 01/15/2015 FINAL  Final  Culture, respiratory (NON-Expectorated)     Status: None   Collection Time: 01/15/15  2:00 PM  Result Value Ref Range Status   Specimen Description TRACHEAL ASPIRATE   Final   Special Requests NONE  Final   Gram Stain   Final    RARE WBC PRESENT,BOTH PMN AND MONONUCLEAR NO ORGANISMS SEEN Performed [facility] Performed at [facility]    Culture   Final    NO GROWTH 2 DAYS Performed at [facility]    Report Status 01/17/2015 FINAL  Final  Culture, blood (routine x 2)     Status: None (Preliminary result)   Collection Time: 01/17/15  3:00 PM  Result Value Ref Range Status   Specimen Description BLOOD LEFT HAND  Final   Special Requests BOTTLES DRAWN AEROBIC ONLY 3CC  Final   Culture   Final  BLOOD CULTURE RECEIVED NO GROWTH TO DATE CULTURE WILL BE HELD FOR 5 DAYS BEFORE ISSUING A FINAL NEGATIVE REPORT Performed at [facility]    Report Status PENDING  Incomplete  Culture, blood (routine x 2)     Status: None (Preliminary result)   Collection Time: 01/17/15  3:20 PM  Result Value Ref Range Status   Specimen Description BLOOD RIGHT HAND  Final   Special Requests BOTTLES DRAWN AEROBIC ONLY 10CC  Final   Culture   Final           BLOOD CULTURE RECEIVED NO GROWTH TO DATE CULTURE WILL BE HELD FOR 5 DAYS BEFORE ISSUING A FINAL NEGATIVE REPORT Performed at [facility]    Report Status PENDING  Incomplete  Clostridium Difficile by PCR     Status: None   Collection Time: 01/18/15  5:55 AM  Result Value Ref Range Status   C difficile by pcr NEGATIVE NEGATIVE Final  Clostridium Difficile by PCR     Status: None   Collection Time: 01/18/15  6:00 PM  Result Value Ref Range Status   C difficile by pcr NEGATIVE NEGATIVE Final   Assessment: 54 y.o male presented to ED 5/18 PM with chief complaint of SOB and cough + productive cough, fever, and generalized weakness x 4 - 5 days. Found to be positive for legionella antigen as well as MSSA in BAL - both sensitive to Levofloxacin. Today is abx day #12 (planned for 21 days). Tmax is 101.6 and WBC is WNL. SCr stable at 0.89.   Goal of Therapy:  Proper  dosing based on hepatic and renal function  Plan:  - Continue Levofloxacin  IV q24h - F/u renal fxn, C&S, clinical status  Lysle Pearl, PharmD, BCPS Pager # [phone] 01/22/2015 9:41 AM

## 2015-01-22 NOTE — Evaluation (Signed)
Clinical/Bedside Swallow Evaluation Patient Details  Name: [name] MRN: [id number] Date of Birth: 06/08/1961  Today's Date: 01/22/2015 Time: SLP Start Time (ACUTE ONLY): 0945 SLP Stop Time (ACUTE ONLY): 1000 SLP Time Calculation (min) (ACUTE ONLY): 15 min  Past Medical History:  Past Medical History  Diagnosis Date  . Hypertension   . GERD (gastroesophageal reflux disease)   . Osteoporosis   . Migraine   . Chronic lower back pain   . Vitamin D deficiency   . Hepatomegaly   . Pancreatitis   . Diabetes mellitus   . Fracture acetabulum-closed 04/09/2013   Past Surgical History:  Past Surgical History  Procedure Laterality Date  . Cholecystectomy    . Hernia repair    . Laminectomy    . Back surgery     HPI:  54 y.o. M [facility] with acute hypoxic respiratory failure and severe bilateral infiltrates. Required intubation in AM 5/19. Treated as legionella PNA, ARDS. Extubated 5/29.    Assessment / Plan / Recommendation Clinical Impression  Pt demonstrates signs of an acute reversible dysphagia following 11 day intubation. Though pt is dysphonic, vocal quailty improved after expectoration of mucous and increased effort. There was immediate coughing with 50% of thin liquid trials indicating decreased airway protection. However, pt tolerated puree well and may be ready for a modified diet and avoid NG feeding tube. Will f/u with FEES today.     Aspiration Risk  Moderate    Diet Recommendation NPO   Medication Administration: Via alternative means    Other  Recommendations Oral Care Recommendations: Oral care QID Other Recommendations: Have oral suction available   Follow Up Recommendations       Frequency and Duration        Pertinent Vitals/Pain NA    SLP Swallow Goals     Swallow Study Prior Functional Status  Available Help at Discharge: Family    General Other Pertinent Information: 54 y.o. M [facility] with  acute hypoxic respiratory failure and severe bilateral infiltrates. Required intubation in AM 5/19. Treated as legionella PNA, ARDS. Extubated 5/29.  Type of Study: Bedside swallow evaluation Diet Prior to this Study: NPO Temperature Spikes Noted: Yes Respiratory Status: Supplemental O2 delivered via (comment) (high flow O2) History of Recent Intubation: Yes Length of Intubations (days): 11 days Date extubated: 01/21/15 Behavior/Cognition: Alert;Impulsive Oral Cavity - Dentition: Adequate natural dentition/normal for age Self-Feeding Abilities: Needs assist Patient Positioning: Upright in chair/Tumbleform Baseline Vocal Quality: Hoarse;Low vocal intensity Volitional Cough: Strong Volitional Swallow: Able to elicit    Oral/Motor/Sensory Function Overall Oral Motor/Sensory Function: Appears within functional limits for tasks assessed   Ice Chips     Thin Liquid Thin Liquid: Impaired Presentation: Cup;Self Fed Pharyngeal  Phase Impairments: Suspected delayed Swallow;Cough - Immediate    Nectar Thick Nectar Thick Liquid: Not tested   Honey Thick Honey Thick Liquid: Not tested   Puree Puree: Within functional limits   Solid   GO    Solid: Not tested      Harlon DittyBonnie Lesa Vandall, MA CCC-SLP [phone]  Claudine MoutonDeBlois, Keiron Iodice Caroline 01/22/2015,10:08 AM

## 2015-01-23 ENCOUNTER — Inpatient Hospital Stay (HOSPITAL_COMMUNITY): Payer: Medicare Other

## 2015-01-23 LAB — CULTURE, BLOOD (ROUTINE X 2)
CULTURE: NO GROWTH
Culture: NO GROWTH

## 2015-01-23 LAB — CBC WITH DIFFERENTIAL/PLATELET
BASOS ABS: 0 10*3/uL (ref 0.0–0.1)
Basophils Relative: 0 % (ref 0–1)
Eosinophils Absolute: 0.1 10*3/uL (ref 0.0–0.7)
Eosinophils Relative: 1 % (ref 0–5)
HCT: 31.7 % — ABNORMAL LOW (ref 39.0–52.0)
Hemoglobin: 10.3 g/dL — ABNORMAL LOW (ref 13.0–17.0)
LYMPHS PCT: 25 % (ref 12–46)
Lymphs Abs: 1.8 10*3/uL (ref 0.7–4.0)
MCH: 32 pg (ref 26.0–34.0)
MCHC: 32.5 g/dL (ref 30.0–36.0)
MCV: 98.4 fL (ref 78.0–100.0)
MONO ABS: 0.8 10*3/uL (ref 0.1–1.0)
Monocytes Relative: 11 % (ref 3–12)
NEUTROS ABS: 4.6 10*3/uL (ref 1.7–7.7)
NEUTROS PCT: 63 % (ref 43–77)
Platelets: 180 10*3/uL (ref 150–400)
RBC: 3.22 MIL/uL — ABNORMAL LOW (ref 4.22–5.81)
RDW: 16.1 % — AB (ref 11.5–15.5)
WBC: 7.2 10*3/uL (ref 4.0–10.5)

## 2015-01-23 LAB — BASIC METABOLIC PANEL
ANION GAP: 9 (ref 5–15)
BUN: 24 mg/dL — ABNORMAL HIGH (ref 6–20)
CO2: 25 mmol/L (ref 22–32)
Calcium: 8.4 mg/dL — ABNORMAL LOW (ref 8.9–10.3)
Chloride: 110 mmol/L (ref 101–111)
Creatinine, Ser: 0.85 mg/dL (ref 0.61–1.24)
GFR calc Af Amer: >60 mL/min (ref >60)
GFR calc non Af Amer: >60 mL/min (ref >60)
Glucose, Bld: 131 mg/dL — ABNORMAL HIGH (ref 65–99)
Potassium: 3.5 mmol/L (ref 3.5–5.1)
SODIUM: 144 mmol/L (ref 135–145)

## 2015-01-23 LAB — GLUCOSE, CAPILLARY
GLUCOSE-CAPILLARY: 118 mg/dL — AB (ref 65–99)
GLUCOSE-CAPILLARY: 194 mg/dL — AB (ref 65–99)
Glucose-Capillary: 166 mg/dL — ABNORMAL HIGH (ref 65–99)
Glucose-Capillary: 380 mg/dL — ABNORMAL HIGH (ref 65–99)
Glucose-Capillary: 166 mg/dL — ABNORMAL HIGH (ref 65–99)

## 2015-01-23 LAB — LEGIONELLA CULTURE

## 2015-01-23 LAB — TRIGLYCERIDES: Triglycerides: 230 mg/dL — ABNORMAL HIGH (ref <150)

## 2015-01-23 NOTE — Progress Notes (Signed)
I contacted pt's wife by phone and met wife briefly at bedside upon her arrival. She is in agreement to an inpt rehab admission pending insurance approval. Pt has PTSD and has Medicare, but wife has full time employment therefore [employer] may be his primary insurance which I am clarifying today. I have requested OT eval to assist with that approval. I will follow up tomorrow. [phone]

## 2015-01-23 NOTE — Progress Notes (Signed)
Patient taken off of high flow nasal cannula and placed on 4L nasal cannula.  Sats currently 98% and tolerating well.  Will continue to monitor.

## 2015-01-23 NOTE — Consult Note (Signed)
Physical Medicine and Rehabilitation Consult Reason for Consult: Critical illness myopathy/respiratory failure Referring Physician: Critical care   HPI: [name] is a 54 y.o. right handed male with history of former tobacco abuse, diabetes mellitus peripheral neuropathy, chronic low back pain and hypertension as well as pancreatitis. Patient lives with spouse independent prior to admission. Presented 01/11/2015 with acute respiratory failure findings of severe bilateral infiltrates requiring intubation. Placed on broad-spectrum antibodies. Noted Legionella culture from tracheal aspirate 01/11/2015. Underwent bronchoscopy 01/15/2015 for removal of heavy secretions mucous plug. Patient was later extubated 01/21/2015. Noted bouts of restlessness and agitation. Currently maintained on Levaquin 21 days. Subcutaneous heparin for DVT prophylaxis. Dysphagia #1 pudding thick liquid diet. Physical therapy evaluation completed 01/22/2015 with recommendations of physical medicine rehabilitation consult.  Pt gives hx of chronic low back pain with 3 lumbar surgeries, used WC for long distance ambulation  Review of Systems  Gastrointestinal:       GERD  Musculoskeletal: Positive for back pain and joint pain.  Neurological: Positive for headaches.  All other systems reviewed and are negative.  Past Medical History  Diagnosis Date  . Hypertension   . GERD (gastroesophageal reflux disease)   . Osteoporosis   . Migraine   . Chronic lower back pain   . Vitamin D deficiency   . Hepatomegaly   . Pancreatitis   . Diabetes mellitus   . Fracture acetabulum-closed 04/09/2013   Past Surgical History  Procedure Laterality Date  . Cholecystectomy    . Hernia repair    . Laminectomy    . Back surgery     Family History  Problem Relation Age of Onset  . Cancer      breast/grandmother, prostate/grandfather   Social History:  reports that he quit smoking about 7 years ago. He has never  used smokeless tobacco. He reports that he does not drink alcohol or use illicit drugs. Allergies:  Allergies  Allergen Reactions  . Ketorolac Tromethamine     Hives (toradol)   Medications Prior to Admission  Medication Sig Dispense Refill  . albuterol (PROVENTIL HFA;VENTOLIN HFA) 108 (90 BASE) MCG/ACT inhaler Inhale 1 puff into the lungs every 6 (six) hours as needed for wheezing or shortness of breath.    . baclofen (LIORESAL) 10 MG tablet Take 10 mg by mouth 3 (three) times daily.    . Calcium Carb-Cholecalciferol (CALCIUM-VITAMIN D) 600-400 MG-UNIT TABS Take 1 tablet by mouth daily.    . cyanocobalamin 1000 MCG tablet Take 100 mcg by mouth daily.    . ergocalciferol (VITAMIN D2) 50000 UNITS capsule Take 50,000 Units by mouth once a week. On Saturday    . fluticasone (VERAMYST) 27.5 MCG/SPRAY nasal spray Place 2 sprays into the nose daily.    Marland Kitchen gabapentin (NEURONTIN) 400 MG capsule Take 1,200 mg by mouth 3 (three) times daily.    . hydrochlorothiazide (MICROZIDE) 12.5 MG capsule Take 12.5 mg by mouth daily.    . insulin aspart (NOVOLOG FLEXPEN) 100 UNIT/ML SOPN FlexPen Inject 7 Units into the skin 3 (three) times daily with meals. 9 mL 6  . Insulin Glargine (LANTUS SOLOSTAR) 100 UNIT/ML SOPN Inject 30 Units into the skin daily. 9 mL 6  . MORPHINE SULFATE PO Take 1 tablet by mouth 3 (three) times daily.  tablet. Take one tablet tid a day per patient    . oxyCODONE (OXY IR/ROXICODONE) 5 MG immediate release tablet Take 10 mg by mouth every 4 (four) hours as needed for pain.    Marland Kitchen  pantoprazole (PROTONIX) 20 MG tablet Take 20 mg by mouth daily.    . ramipril (ALTACE) 5 MG capsule Take 5 mg by mouth daily.    . sertraline (ZOLOFT) 100 MG tablet Take 100 mg by mouth daily.     . ramipril (ALTACE) 10 MG capsule Take 1 capsule (10 mg total) by mouth daily. (Patient not taking: Reported on 01/10/2015) 30 capsule 11    Home: Home Living Family/patient expects to be discharged to:: Private  residence Living Arrangements: Spouse/significant other Available Help at Discharge: Family Additional Comments: Pt was not able to provide many details of home environment other than that he lives with his wife. Unclear whether she is available 24 hours.   Functional History: Prior Function Level of Independence: Independent Functional Status:  Mobility: Bed Mobility Overal bed mobility: Needs Assistance Bed Mobility: Supine to Sit Supine to sit: Min guard General bed mobility comments: Close guard for safety. Pt initially trying to climb over the bed rails. Required hands-on guarding for trunk stability as he transitioned to full sitting position.  Transfers Overall transfer level: Needs assistance Equipment used: 2 person hand held assist Transfers: Sit to/from Stand, Stand Pivot Transfers Sit to Stand: Min assist, +2 physical assistance Stand pivot transfers: Min assist, +2 physical assistance General transfer comment: +2 support required for balance and support as pt stood and pivoted over to the recliner chair. Pt is impulsive with initiating transfers.  Ambulation/Gait General Gait Details: Deferred at this time for safety.     ADL:    Cognition: Cognition Overall Cognitive Status: Difficult to assess Orientation Level: Oriented X4 Cognition Arousal/Alertness: Awake/alert Behavior During Therapy: Restless, Anxious, Impulsive Overall Cognitive Status: Difficult to assess Difficult to assess due to: Impaired communication (Difficult to understand )  Blood pressure 109/71, pulse 64, temperature 99.1 F (37.3 C), temperature source Core (Comment), resp. rate 26, height  (1.727 m), weight 81.1 kg (178 lb 12.7 oz), SpO2 96 %. Physical Exam  HENT:  Head: Normocephalic.  Eyes:  Pupils reactive to light  Neck: Normal range of motion. Neck supple. No thyromegaly present.  Cardiovascular: Normal rate and regular rhythm.   Respiratory: Effort normal and breath sounds  normal. No respiratory distress.  GI: Soft. Bowel sounds are normal. He exhibits no distension.  Neurological: He is alert.  Patient makes good eye contact with examiner. His voice is quite hoarse but intelligible. He was able to provide his name, age, date of birth. Fair awareness of deficits. Follows simple commands  Skin: Skin is warm and dry.  4/5 in Bilateral delt bi tri grip HF KE ADF Sensation intact LT in both UE and LE Midline sternotomy  Healing well Results for orders placed or performed during the hospital encounter of 01/10/15 (from the past 24 hour(s))  Glucose, capillary     Status: Abnormal   Collection Time: 01/22/15  7:49 AM  Result Value Ref Range   Glucose-Capillary 185 (H) 65 - 99 mg/dL   Comment 1 Notify RN   Glucose, capillary     Status: Abnormal   Collection Time: 01/22/15 11:27 AM  Result Value Ref Range   Glucose-Capillary 180 (H) 65 - 99 mg/dL  Glucose, capillary     Status: Abnormal   Collection Time: 01/22/15  4:24 PM  Result Value Ref Range   Glucose-Capillary 297 (H) 65 - 99 mg/dL  Glucose, capillary     Status: Abnormal   Collection Time: 01/22/15  8:20 PM  Result Value Ref Range  Glucose-Capillary 191 (H) 65 - 99 mg/dL  Glucose, capillary     Status: Abnormal   Collection Time: 01/22/15 11:28 PM  Result Value Ref Range   Glucose-Capillary 272 (H) 65 - 99 mg/dL  Glucose, capillary     Status: Abnormal   Collection Time: 01/23/15  3:37 AM  Result Value Ref Range   Glucose-Capillary 166 (H) 65 - 99 mg/dL  Basic metabolic panel     Status: Abnormal   Collection Time: 01/23/15  5:20 AM  Result Value Ref Range   Sodium 144 135 - 145 mmol/L   Potassium 3.5 3.5 - 5.1 mmol/L   Chloride 110 101 - 111 mmol/L   CO2 25 22 - 32 mmol/L   Glucose, Bld 131 (H) 65 - 99 mg/dL   BUN 24 (H) 6 - 20 mg/dL   Creatinine, Ser 5.620.85 0.61 - 1.24 mg/dL   Calcium 8.4 (L) 8.9 - 10.3 mg/dL   GFR calc non Af Amer >60 >60 mL/min   GFR calc Af Amer >60 >60 mL/min   Anion  gap 9 5 - 15  CBC with Differential/Platelet     Status: Abnormal   Collection Time: 01/23/15  5:20 AM  Result Value Ref Range   WBC 7.2 4.0 - 10.5 K/uL   RBC 3.22 (L) 4.22 - 5.81 MIL/uL   Hemoglobin 10.3 (L) 13.0 - 17.0 g/dL   HCT 13.031.7 (L) 86.539.0 - 78.452.0 %   MCV 98.4 78.0 - 100.0 fL   MCH 32.0 26.0 - 34.0 pg   MCHC 32.5 30.0 - 36.0 g/dL   RDW 69.616.1 (H) 29.511.5 - 28.415.5 %   Platelets 180 150 - 400 K/uL   Neutrophils Relative % 63 43 - 77 %   Neutro Abs 4.6 1.7 - 7.7 K/uL   Lymphocytes Relative 25 12 - 46 %   Lymphs Abs 1.8 0.7 - 4.0 K/uL   Monocytes Relative 11 3 - 12 %   Monocytes Absolute 0.8 0.1 - 1.0 K/uL   Eosinophils Relative 1 0 - 5 %   Eosinophils Absolute 0.1 0.0 - 0.7 K/uL   Basophils Relative 0 0 - 1 %   Basophils Absolute 0.0 0.0 - 0.1 K/uL  Triglycerides     Status: Abnormal   Collection Time: 01/23/15  5:20 AM  Result Value Ref Range   Triglycerides 230 (H) <150 mg/dL   Dg Chest Port 1 View  01/23/2015   CLINICAL DATA:  Acute respiratory failure, hypoxia, lesion Ella pneumonia, ARDS.  EXAM: PORTABLE CHEST - 1 VIEW  COMPARISON:  Portable chest x-ray of Jan 21, 2015  FINDINGS: There has been interval extubation of the trachea and of the esophagus. The lungs are adequately inflated. The hemidiaphragms are better demonstrated today. There is subsegmental atelectasis at the lung bases not greatly changed from the previous study. The cardiac silhouette remains enlarged. The pulmonary vascularity is significantly nor 2. There are fusion devices associated with the mid thoracic spine.  IMPRESSION: Interval improvement in the appearance of both lungs since extubation. There is persistent mild cardiomegaly and bibasilar subsegmental atelectasis.   Electronically Signed   By: David  [place] M.D.   On: 01/23/2015 07:25    Assessment/Plan: Diagnosis: deconditioning after respiratory failure in a pt who is chronically disabled due to back pain 1. Does the need for close, 24 hr/day medical  supervision in concert with the patient's rehab needs make it unreasonable for this patient to be served in a less intensive setting? Yes 2. Co-Morbidities requiring  supervision/potential complications: diabetes with peripheral neuropathy, chronic narctoic analgesic dependance 3. Due to bladder management, bowel management, safety, skin/wound care, disease management, medication administration, pain management and patient education, does the patient require 24 hr/day rehab nursing? Yes 4. Does the patient require coordinated care of a physician, rehab nurse, PT (1-2 hrs/day, 5 days/week), OT (1-2 hrs/day, 5 days/week) and SLP (.5-1 hrs/day, 5 days/week) to address physical and functional deficits in the context of the above medical diagnosis(es)? Yes Addressing deficits in the following areas: balance, endurance, locomotion, strength, transferring, bowel/bladder control, bathing, dressing, feeding, grooming, toileting and swallowing 5. Can the patient actively participate in an intensive therapy program of at least 3 hrs of therapy per day at least 5 days per week? Yes 6. The potential for patient to make measurable gains while on inpatient rehab is good 7. Anticipated functional outcomes upon discharge from inpatient rehab are modified independent and supervision  with PT, modified independent and supervision with OT, modified independent with SLP. 8. Estimated rehab length of stay to reach the above functional goals is: 7-10d  9. Does the patient have adequate social supports and living environment to accommodate these discharge functional goals? Yes 10. Anticipated D/C setting: Home 11. Anticipated post D/C treatments: HH therapy 12. Overall Rehab/Functional Prognosis: good  RECOMMENDATIONS: This patient's condition is appropriate for continued rehabilitative care in the following setting: CIR Patient has agreed to participate in recommended program. Yes Note that insurance prior authorization  may be required for reimbursement for recommended care.  Comment:     01/23/2015

## 2015-01-23 NOTE — Progress Notes (Signed)
Nutrition Follow-up  DOCUMENTATION CODES:  Not applicable  INTERVENTION:   Magic cup BID with meals, each supplement provides 290 kcal and 9 grams of protein  Pudding BID between meals, 140 kcals, 3 gm protein per serving  NUTRITION DIAGNOSIS:  Inadequate oral intake related to dysphagia as evidenced by meal completion < 50%.  Ongoing  GOAL:  Patient will meet greater than or equal to 90% of their needs  Unmet  MONITOR:  PO intake, Supplement acceptance, Weight trends, Labs, Skin   ASSESSMENT: Patient admitted on 5/18; being treated for Legionella PNA. Has been on and off TF while on ventilator. Required prone positioning a few times. Extubated on 5/29. TF now off.   SLP following for dysphagia. Diet advanced to dysphagia 3 with nectar thick liquids this AM, required pudding thick liquids when diet first advanced. Patient glad to be off of pudding thick liquids now. He is consuming ~25% of meals. Needs increased protein and calories to promote healing (stage 2 wound to arm) and recovery.  Height:  Ht Readings from Last 1 Encounters:  01/11/15 5\' 8"  (1.727 m)    Weight:  Wt Readings from Last 1 Encounters:  01/23/15 178 lb 12.7 oz (81.1 kg)    Ideal Body Weight:  70 kg  Wt Readings from Last 10 Encounters:  01/23/15 178 lb 12.7 oz (81.1 kg)  08/12/13 178 lb (80.74 kg)  07/30/13 176 lb (79.833 kg)  04/22/12 166 lb (75.297 kg)  03/04/11 141 lb (63.957 kg)  06/12/10 156 lb (70.761 kg)  05/01/10 163 lb (73.936 kg)  10/02/09 164 lb 4 oz (74.503 kg)  08/29/09 161 lb (73.029 kg)  05/31/09 167 lb (75.751 kg)    BMI:  Body mass index is 27.19 kg/(m^2).  Estimated Nutritional Needs:  Kcal:  1900-2100  Protein:  95-110 gm  Fluid:  >/= 2 L  Skin:  Wound (see comment) (stage 2 pressure ulcer to arm)  Diet Order:  DIET DYS 3 Room service appropriate?: Yes; Fluid consistency:: Nectar Thick  EDUCATION NEEDS:  No education needs identified at this  time   Intake/Output Summary (Last 24 hours) at 01/23/15 1304 Last data filed at 01/23/15 1000  Gross per 24 hour  Intake 861.53 ml  Output    770 ml  Net  91.53 ml    Last BM:  5/31   Joaquin CourtsKimberly Harris, RD, LDN, CNSC Pager [phone] After Hours Pager [phone]

## 2015-01-23 NOTE — Progress Notes (Signed)
PULMONARY / CRITICAL CARE MEDICINE   Name: [name] MRN: [id number] DOB: 1960-10-10    ADMISSION DATE:  01/10/2015 CONSULTATION DATE:  01/23/2015  REFERRING MD :  EDP  CHIEF COMPLAINT:  SOB, cough  INITIAL PRESENTATION:   54 y.o. M presented to [facility] ED late 5/18 PM with acute hypoxic respiratory failure and severe bilateral infiltrates. Required intubation in AM 5/19. Treated as PNA, ARDS.   INDWELLING DEVICES:: ETT 5/19 >>  R IJ CVL 5/19 >>  R radial A-line 5/19 >>5/26  MICRO DATA: MRSA PCR 5/18 >> NEG Blood 5/18 >> NEG Strep Ag 5/19 >> NEG Legionella Ag 5/19 >> POSITIVE BAL 5/19 >>  Flu panel (trach asp) 5/19 >> NEG RVP (trach asp) 5/19 >> NEG Legionella cx (trach asp) 5/19 >>   ANTIMICROBIALS:  Pip-tazo 5/18 X 1 Vanc 5/18 >> 5/20 Levofloxacin 5/19 >> (stop date is 02/01/15)       MAJOR EVENTS/TEST RESULTS: 5/18 Admit via ED to ICU/PCCM service 5/19 AM: intubated. ARDS protocol 5/19 RUQ [place] (elevated bilirubin): s/p chole, nothing acute 5/20 worsening gas exchange. Started on Nimbex. Prone ventilation. Vasopressors initiated 5/21 Off vasopressors./ Severe gas trapping. Very high PIPs. Quick look FOB > extensive thick secretions in proximal airway and partially obstructing tip of ETT. Secretions successfully suctioned and removed. PIPs markedly improved. Pt left in supine position after FOB. ARDS protocol. Vt reduced to 6 cc/kg IBW. Gas exchange much improved 5/23 bronch clean, proned, neg 3.3 liters 5/24- neg 2 liters 5/26- fevers, diarrhea  5/27- agitation, more awake 5/29 extubated 01/22/15:   Extubated, delirius but breathing OK Put on precedex Receiving prn fentanyl, haldol     SUBJECTIVE/OVERNIGHT/INTERVAL HX 01/23/15: Off precedex x 10h. RN reports normal mental status off precedex. Patint going to toilet by self. On d3 diet. RN says no tachycardia. Reports Pulse ox 95% n 4L [place] without resp distress. Patient himself denies complaints.  VITAL  SIGNS: Temp:  [98.1 F (36.7 C)-100.1 F (37.8 C)] 98.1 F (36.7 C) (05/31 1214) Pulse Rate:  [20-89] 85 (05/31 1000) Resp:  [14-32] 24 (05/31 1000) BP: (95-157)/(42-82) 96/42 mmHg (05/31 1000) SpO2:  [89 %-100 %] 100 % (05/31 1000) FiO2 (%):  [20 %-70 %] 50 % (05/31 0732) Weight:  [81.1 kg (178 lb 12.7 oz)] 81.1 kg (178 lb 12.7 oz) (05/31 0342) HEMODYNAMICS:   VENTILATOR SETTINGS: Vent Mode:  [-]  FiO2 (%):  [20 %-70 %] 50 % INTAKE / OUTPUT: Intake/Output      05/30 0701 - 05/31 0700 05/31 0701 - 06/01 0700   P.O. 360    I.V. (mL/kg) 510.5 (6.3) 40 (0.5)   NG/GT     IV Piggyback 150    Total Intake(mL/kg) 1020.5 (12.6) 40 (0.5)   Urine (mL/kg/hr) 1185 (0.6)    Stool     Total Output 1185     Net -164.5 +40        Urine Occurrence  1 x   Stool Occurrence  2 x     PHYSICAL EXAMINATION: General: looks deconditioned but improved compared to prior descriptions HENT: NCAT . On nasal cannula PULM: CTA B. No distress. 4L [place] CV: RRR, no mgr GI: BS+,soft, nontender MSK; normal bulk and tone Neuro: awake and alert, answering questions, Moves all 4s. CAM- ICU negative for delirium. RASS +1 MSK: no cyanosis, no clubbing, no edema LABS:  PULMONARY  Recent Labs Lab 01/16/15 2049 01/17/15 0405 01/18/15 0436 01/18/15 0443 01/19/15 0431  PHART 7.373 7.431 7.421 7.395 7.487*  PCO2ART 52.8* 42.9 56.5* 54.4* 40.4  PO2ART 85.0 66.0* 37.0* 75.0* 98.0  HCO3 30.2* 28.4* 36.6* 33.2* 30.4*  TCO2 32 30 38 35 32  O2SAT 95.0 93.0 69.0 94.0 98.0    CBC  Recent Labs Lab 01/21/15 0245 01/22/15 0215 01/23/15 0520  HGB 10.0* 10.6* 10.3*  HCT 32.4* 33.6* 31.7*  WBC 7.5 6.5 7.2  PLT 211 227 180    COAGULATION  Recent Labs Lab 01/20/15 0528  INR 1.20    CARDIAC  No results for input(s): TROPONINI in the last 168 hours. No results for input(s): PROBNP in the last 168 hours.   CHEMISTRY  Recent Labs Lab 01/19/15 0446 01/20/15 0528 01/21/15 0245 01/22/15 0215  01/23/15 0520  NA 147* 147* 148* 148* 144  K 3.2* 3.4* 3.2* 4.0 3.5  CL 109 110 112* 111 110  CO2 $Re'31 29 28 28 25  'sZU$ GLUCOSE 231* 205* 134* 117* 131*  BUN 26* 25* 23* 21* 24*  CREATININE 0.86 0.78 0.72 0.89 0.85  CALCIUM 8.8* 9.1 9.1 9.1 8.4*  MG  --  1.5*  --   --   --   PHOS  --  3.7  --   --   --    Estimated Creatinine Clearance: 98.4 mL/min (by C-G formula based on Cr of 0.85).   LIVER  Recent Labs Lab 01/17/15 0400 01/18/15 0457 01/19/15 0446 01/20/15 0528 01/22/15 0215  AST 82* 89* 85*  --  62*  ALT 40 38 38  --  32  ALKPHOS 527* 459* 396*  --  329*  BILITOT 2.2* 2.1* 2.5*  --  1.8*  PROT 5.5* 5.4* 5.4*  --  6.6  ALBUMIN 1.7* 1.8* 2.0*  --  2.2*  INR  --   --   --  1.20  --      INFECTIOUS No results for input(s): LATICACIDVEN, PROCALCITON in the last 168 hours.   ENDOCRINE CBG (last 3)   Recent Labs  01/22/15 2328 01/23/15 0337 01/23/15 0755  GLUCAP 272* 166* 118*         IMAGING x48h  - image(s) personally visualized  -   highlighted in bold Dg Chest Port 1 View  01/23/2015   CLINICAL DATA:  Acute respiratory failure, hypoxia, lesion Ella pneumonia, ARDS.  EXAM: PORTABLE CHEST - 1 VIEW  COMPARISON:  Portable chest x-ray of Jan 21, 2015  FINDINGS: There has been interval extubation of the trachea and of the esophagus. The lungs are adequately inflated. The hemidiaphragms are better demonstrated today. There is subsegmental atelectasis at the lung bases not greatly changed from the previous study. The cardiac silhouette remains enlarged. The pulmonary vascularity is significantly nor 2. There are fusion devices associated with the mid thoracic spine.  IMPRESSION: Interval improvement in the appearance of both lungs since extubation. There is persistent mild cardiomegaly and bibasilar subsegmental atelectasis.   Electronically Signed   By: David  Martin[place] M.D.   On: 01/23/2015 07:25          ASSESSMENT / PLAN:  PULMONARY A: #Baseline  - former  smoker  #Current Acute hypoxemic respiratory failure  Due to ARDS due to Legionella pneumonia> improved greatly    - 01/23/15: on 4L [place]  P:   Out of bed, chair  Incentive spirometry Flutter CXR prn Wean o2 as tolerated  INFECTIOUS A:   Legionella PNA Superinfection, MSSA Fever 5/25 >fever persistent, but no new sign of infection, lines out, suspect legionella related, WBC OK UA neg   -  no fver  P:   Levofloxacin for 21 days  Through above stop date 02/01/15   CARDIOVASCULAR A:  Hx HTN P:  MAP goal > 60 mmHg Dc tele  RENAL A:   Nil acute 01/23/15 P:   Monitor BMET and UOP Replace electrolytes as needed   GASTROINTESTINAL A:   H/O GERD, chronic PPI Elevated AST and alk phos> likely related to legionella (improved) Hyperbilirubinemia> ultrasound unremarkable Hx pancreatitis  - alk phos trending down. On D3 diet  P:   dieet per speech Monitor LFT every few days  HEMATOLOGIC A:   Mild thrombocytopenia - resolved. Was due to critical illness Anemia of critical illness - unchanged P:  DVT px: SQ heparin PRBC for hgb  < 7gm%   ENDOCRINE A:   DM 2, now controlled P:   Lantus 15, keep Cont Mod scale SSI  NEUROLOGIC A:   #Baseline Chronic lower back pain - baclofen $RemoveBe'10mg'UPUhCIIlo$  tid, gabapentin $RemoveBeforeD'1200mg'KNnwGFsNBohslW$  tid !!!!  Depression = zoloft $Remove'100mg'QHUWUjv$  daily  #Current Encephalopathy/delirium> new/worse 5/30,    - resolved encephalopathy after precedex. Now off precedex x 10h. QTc 01/22/15  - 448msec  P:   Prn fentanyl post extubation for pain Dc Precedex for severe agitation Limit Restraints as able, but use to prevent fall Haldol PRN, follow QTc Ssertraline $RemoveBeforeD'100mg'xJZuckUClBegRl$  hom dose to continue For pain : go with lower dose gabapentin $RemoveBeforeD'300mg'mwSpHYqYroDFhQ$  tid and  continue Baclofen baseline dose of $Remov'10mg'vyzhSn$  tid    Family updated: wife updated bedside by [facility] 5/29. None at bedside 01/23/15. PAtient updated; seemed to comprehend some basic stuff only  Interdisciplinary Family Meeting v  Palliative Care Meeting:  Due by: 5/25   Move to Med-surg. TRH primary from 01/24/15 with 1d PCCM overlap - d/w Dr Sherral Hammers    Dr. Brand Males, M.D., [facility].C.P Pulmonary and Critical Care Medicine Staff Physician [facility] Pager: [phone], If no answer or between  15:00h - 7:00h: call [phone]  01/23/2015 12:34 PM

## 2015-01-23 NOTE — Progress Notes (Signed)
Speech Language Pathology Treatment: Dysphagia  Patient Details Name: [name] MRN: [id number] DOB: 05/22/1961 Today's Date: 01/23/2015 Time: 0900-0920 SLP Time Calculation (min) (ACUTE ONLY): 20 min  Assessment / Plan / Recommendation Clinical Impression  Pt fully alert and more attentive today, still moderately impulsive with impaired reasoning and safety awareness. Provided trials of nectar thick liquids with min verbal cues for second swallow. Mild wet vocal quality noted after 4 oz, cleared with cued throat clear. Pt completed another 2 oz with no cough or wetness. Thin liquids immediately elicited cough. Vocal quality has improved but pt is still mildly dysphonic.   Overall, pts function is improving, but he still requires nectar thickened liquids to reduce penetration/aspiration events. Explained reasoning and safety measures to pt repeatedly. Will upgrade to dys 3 (mech soft) with nectar thick liquids. Pt may also have ice chips, one at a time.    HPI Other Pertinent Information: 54 y.o. M [facility] late 5/18 PM with acute hypoxic respiratory failure and severe bilateral infiltrates. Required intubation in AM 5/19. Treated as legionella PNA, ARDS. Extubated 5/29.    Pertinent Vitals Pain Assessment: No/denies pain  SLP Plan  Continue with current plan of care    Recommendations Diet recommendations: Dysphagia 3 (mechanical soft);Nectar-thick liquid (ice chips ok) Liquids provided via: Cup Medication Administration: Whole meds with puree Supervision: Patient able to self feed;Intermittent supervision to cue for compensatory strategies Compensations: Multiple dry swallows after each bite/sip Postural Changes and/or Swallow Maneuvers: Seated upright 90 degrees              General recommendations: Rehab consult Oral Care Recommendations: Oral care BID Follow up Recommendations: Inpatient Rehab Plan: Continue with current plan of care    GO    [facility] HospitalBonnie Abigayle Wilinski,  MA CCC-SLP [phone]  Claudine MoutonDeBlois, [name] 01/23/2015, 9:30 AM

## 2015-01-23 NOTE — Progress Notes (Signed)
Pt with 4 loose stools this shift. Loose, watery yellow-brown stools. Dr. Marchelle Gearingamaswamy notified c-doff tested x's 2 this admission with negative results. Verbal order given for flexi-seal. Nursing to insert and monitor pt.

## 2015-01-23 NOTE — Progress Notes (Signed)
Inpatient Diabetes Program Recommendations  AACE/ADA: New Consensus Statement on Inpatient Glycemic Control (2013)  Target Ranges:  Prepandial:   less than 140 mg/dL      Peak postprandial:   less than 180 mg/dL (1-2 hours)      Critically ill patients:  140 - 180 mg/dL  Results for [name], [name] (MRN [id number]) as of 01/23/2015 11:42  Ref. Range 01/22/2015 07:49 01/22/2015 11:27 01/22/2015 16:24 01/22/2015 20:20 01/22/2015 23:28 01/23/2015 03:37 01/23/2015 07:55  Glucose-Capillary Latest Ref Range: 65-99 mg/dL 409185 (H) 811180 (H) 914297 (H) 191 (H) 272 (H) 166 (H) 118 (H)   Diabetes history: DM2 Outpatient Diabetes medications: Lantus 30 units daily, Novolog 7 units TID with meals Current orders for Inpatient glycemic control: Lantus 15 units QHS, Novolog 0-15 units Q4H, Novolog 4 units Q4H for tube feeding coverage  Inpatient Diabetes Program Recommendations Correction (SSI): Patient is now eating. Please consider changing frequency of Novolog correction to 0-15 units TID with meals and adding Novolog bedtime correction scale. Insulin - Meal Coverage: Note that patient has Novolog 4 units Q4H ordered for tube feeding coverage. However patient is no longer on tube feeding. Please consider discontinuing Novolog 4 units Q4H and if patient is eating at least 50% of meals then consider ordering Novolog 4 units TID with meals for meal coverage.  Thanks, Orlando PennerMarie Kaelem Brach, RN, MSN, CCRN, CDE Diabetes Coordinator Inpatient Diabetes Program [phone] (Team Pager from 8am to 5pm) [phone] ([facility] office) [phone] [facility] office) [phone] [facility] office)

## 2015-01-24 LAB — BASIC METABOLIC PANEL
Anion gap: 11 (ref 5–15)
BUN: 11 mg/dL (ref 6–20)
CO2: 24 mmol/L (ref 22–32)
Calcium: 8.7 mg/dL — ABNORMAL LOW (ref 8.9–10.3)
Chloride: 108 mmol/L (ref 101–111)
Creatinine, Ser: 0.84 mg/dL (ref 0.61–1.24)
GFR calc Af Amer: >60 mL/min (ref >60)
GLUCOSE: 148 mg/dL — AB (ref 65–99)
POTASSIUM: 2.6 mmol/L — AB (ref 3.5–5.1)
Sodium: 143 mmol/L (ref 135–145)

## 2015-01-24 LAB — CBC WITH DIFFERENTIAL/PLATELET
BASOS PCT: 0 % (ref 0–1)
Basophils Absolute: 0 10*3/uL (ref 0.0–0.1)
EOS ABS: 0.1 10*3/uL (ref 0.0–0.7)
EOS PCT: 1 % (ref 0–5)
HEMATOCRIT: 32.4 % — AB (ref 39.0–52.0)
Hemoglobin: 10.4 g/dL — ABNORMAL LOW (ref 13.0–17.0)
LYMPHS PCT: 28 % (ref 12–46)
Lymphs Abs: 1.3 10*3/uL (ref 0.7–4.0)
MCH: 31.4 pg (ref 26.0–34.0)
MCHC: 32.1 g/dL (ref 30.0–36.0)
MCV: 97.9 fL (ref 78.0–100.0)
MONOS PCT: 9 % (ref 3–12)
Monocytes Absolute: 0.4 10*3/uL (ref 0.1–1.0)
NEUTROS PCT: 62 % (ref 43–77)
Neutro Abs: 2.8 10*3/uL (ref 1.7–7.7)
PLATELETS: 227 10*3/uL (ref 150–400)
RBC: 3.31 MIL/uL — AB (ref 4.22–5.81)
RDW: 15.5 % (ref 11.5–15.5)
WBC: 4.6 10*3/uL (ref 4.0–10.5)

## 2015-01-24 LAB — PHOSPHORUS: Phosphorus: 3.6 mg/dL (ref 2.5–4.6)

## 2015-01-24 LAB — GLUCOSE, CAPILLARY
Glucose-Capillary: 171 mg/dL — ABNORMAL HIGH (ref 65–99)
Glucose-Capillary: 141 mg/dL — ABNORMAL HIGH (ref 65–99)
Glucose-Capillary: 125 mg/dL — ABNORMAL HIGH (ref 65–99)
Glucose-Capillary: 226 mg/dL — ABNORMAL HIGH (ref 65–99)
Glucose-Capillary: 163 mg/dL — ABNORMAL HIGH (ref 65–99)

## 2015-01-24 LAB — MAGNESIUM: Magnesium: 1.3 mg/dL — ABNORMAL LOW (ref 1.7–2.4)

## 2015-01-24 LAB — CLOSTRIDIUM DIFFICILE BY PCR: Toxigenic C. Difficile by PCR: NEGATIVE

## 2015-01-24 MED ORDER — OXYCODONE HCL 5 MG PO TABS
5.0000 mg | ORAL_TABLET | ORAL | Status: DC | PRN
  Administered 2015-01-25: 5 mg via ORAL
  Filled 2015-01-24: qty 1

## 2015-01-24 MED ORDER — MORPHINE SULFATE ER 15 MG PO TBCR
15.0000 mg | EXTENDED_RELEASE_TABLET | Freq: Three times a day (TID) | ORAL | Status: DC
  Administered 2015-01-24 – 2015-01-25 (×4): 15 mg via ORAL
  Filled 2015-01-24 (×4): qty 1

## 2015-01-24 MED ORDER — POTASSIUM CHLORIDE CRYS ER 20 MEQ PO TBCR
60.0000 meq | EXTENDED_RELEASE_TABLET | ORAL | Status: AC
  Administered 2015-01-24 (×2): 60 meq via ORAL
  Filled 2015-01-24 (×2): qty 3

## 2015-01-24 MED ORDER — LEVOFLOXACIN 750 MG PO TABS
750.0000 mg | ORAL_TABLET | ORAL | Status: DC
  Administered 2015-01-25: 750 mg via ORAL
  Filled 2015-01-24 (×2): qty 1

## 2015-01-24 NOTE — Progress Notes (Signed)
ANTIBIOTIC CONSULT NOTE  Pharmacy Consult for Levofloxacin Indication: HCAP - Legionella  + MSSA  Allergies  Allergen Reactions  . Ketorolac Tromethamine     Hives (toradol)    Patient Measurements: Height: 5\' 8"  (172.7 cm) Weight: 178 lb 12.7 oz (81.1 kg) IBW/kg (Calculated) : 68.4   Vital Signs: Temp: 98.5 F (36.9 C) (06/01 0421) Temp Source: Oral (06/01 0421) BP: 124/91 mmHg (06/01 0421) Pulse Rate: 59 (06/01 0421) Intake/Output from previous day: 05/31 0701 - 06/01 0700 In: 40 [I.V.:40] Out: 220 [Stool:220] Intake/Output from this shift:    Labs:  Recent Labs  01/22/15 0215 01/23/15 0520 01/24/15 0616  WBC 6.5 7.2 4.6  HGB 10.6* 10.3* 10.4*  PLT 227 180 227  CREATININE 0.89 0.85 0.84   Estimated Creatinine Clearance: 99.5 mL/min (by C-G formula based on Cr of 0.84). No results for input(s): VANCOTROUGH, VANCOPEAK, VANCORANDOM, GENTTROUGH, GENTPEAK, GENTRANDOM, TOBRATROUGH, TOBRAPEAK, TOBRARND, AMIKACINPEAK, AMIKACINTROU, AMIKACIN in the last 72 hours.   Microbiology: Recent Results (from the past 720 hour(s))  Blood Culture (routine x 2)     Status: None   Collection Time: 01/10/15 11:29 PM  Result Value Ref Range Status   Specimen Description BLOOD LEFT HAND  Final   Special Requests BOTTLES DRAWN AEROBIC AND ANAEROBIC 5CC  Final   Culture   Final    NO GROWTH 5 DAYS [facility]    Report Status 01/17/2015 FINAL  Final  Blood Culture (routine x 2)     Status: None   Collection Time: 01/10/15 11:30 PM  Result Value Ref Range Status   Specimen Description BLOOD RIGHT HAND  Final   Special Requests BOTTLES DRAWN AEROBIC AND ANAEROBIC 5CC  Final   Culture   Final    NO GROWTH 5 DAYS Performed at [facility]    Report Status 01/17/2015 FINAL  Final  MRSA PCR Screening     Status: None   Collection Time: 01/11/15  1:40 AM  Result Value Ref Range Status   MRSA by PCR NEGATIVE NEGATIVE Final    Comment:        The  GeneXpert MRSA Assay (FDA approved for NASAL specimens only), is one component of a comprehensive MRSA colonization surveillance program. It is not intended to diagnose MRSA infection nor to guide or monitor treatment for MRSA infections.   Culture, respiratory (NON-Expectorated)     Status: None   Collection Time: 01/11/15  7:00 AM  Result Value Ref Range Status   Specimen Description TRACHEAL ASPIRATE  Final   Special Requests NONE  Final   Gram Stain   Final    NO WBC SEEN NO SQUAMOUS EPITHELIAL CELLS SEEN NO ORGANISMS SEEN [facility]formed at [facility]    Culture   Final    NO GROWTH 2 DAYS Performed at [facility]    Report Status 01/13/2015 FINAL  Final  Respiratory virus panel     Status: None   Collection Time: 01/11/15  7:00 AM  Result Value Ref Range Status   Source - RVPAN TRACHEAL ASPIRATE  Corrected   Respiratory Syncytial Virus A Negative Negative Final   Respiratory Syncytial Virus B Negative Negative Final   Influenza A Negative Negative Final   Influenza B Negative Negative Final   Parainfluenza 1 Negative Negative Final   Parainfluenza 2 Negative Negative Final   Parainfluenza 3 Negative Negative Final   Metapneumovirus Negative Negative Final   Rhinovirus Negative Negative Final   Adenovirus Negative Negative  Final    Comment: (NOTE) Performed At: [facility] [address] [place], [place] [id number] Mila Homer MD BJ:[id number]   Legionella culture     Status: None   Collection Time: 01/11/15  7:00 AM  Result Value Ref Range Status   Specimen Description TRACHEAL ASPIRATE  Final   Special Requests NONE  Final   Culture   Final    LEGIONELLA PNEUMOPHILA TEST PERFORMED AT [facility] [phone]    Report Status 01/23/2015 FINAL  Final  Respiratory virus panel     Status: None   Collection Time: 01/11/15  9:54 AM  Result Value Ref Range Status   Respiratory Syncytial Virus A Negative  Negative Final   Respiratory Syncytial Virus B Negative Negative Final   Influenza A Negative Negative Final   Influenza B Negative Negative Final   Parainfluenza 1 Negative Negative Final   Parainfluenza 2 Negative Negative Final   Parainfluenza 3 Negative Negative Final   Metapneumovirus Negative Negative Final   Rhinovirus Negative Negative Final   Adenovirus Negative Negative Final    Comment: (NOTE) Performed At: [facility] [address] [place], [place] [id number] Mila Homer MD GM:[id number]   Culture, respiratory (NON-Expectorated)     Status: None   Collection Time: 01/11/15 12:30 PM  Result Value Ref Range Status   Specimen Description BRONCHIAL ALVEOLAR LAVAGE  Final   Special Requests Normal  Final   Gram Stain   Final    RARE WBC PRESENT, PREDOMINANTLY PMN NO SQUAMOUS EPITHELIAL CELLS SEEN NO ORGANISMS SEEN Performed at [facility]    Culture   Final    FEW STAPHYLOCOCCUS AUREUS Note: RIFAMPIN AND GENTAMICIN SHOULD NOT BE USED AS SINGLE DRUGS FOR TREATMENT OF STAPH INFECTIONS. Performed at [facility]    Report Status 01/14/2015 FINAL  Final   Organism ID, Bacteria STAPHYLOCOCCUS AUREUS  Final      Susceptibility   Staphylococcus aureus - MIC*    CLINDAMYCIN <=0.25 SENSITIVE Sensitive     ERYTHROMYCIN 0.5 SENSITIVE Sensitive     GENTAMICIN <=0.5 SENSITIVE Sensitive     LEVOFLOXACIN <=0.12 SENSITIVE Sensitive     OXACILLIN 0.5 SENSITIVE Sensitive     PENICILLIN >=0.5 RESISTANT Resistant     RIFAMPIN <=0.5 SENSITIVE Sensitive     TRIMETH/SULFA <=10 SENSITIVE Sensitive     VANCOMYCIN <=0.5 SENSITIVE Sensitive     TETRACYCLINE <=1 SENSITIVE Sensitive     MOXIFLOXACIN <=0.25 SENSITIVE Sensitive     * FEW STAPHYLOCOCCUS AUREUS  Stat Gram stain     Status: None   Collection Time: 01/15/15  2:00 PM  Result Value Ref Range Status   Specimen Description TRACHEAL ASPIRATE  Final   Special Requests Normal  Final   Gram Stain    Final    RARE WBC PRESENT,BOTH PMN AND MONONUCLEAR NO ORGANISMS SEEN    Report Status 01/15/2015 FINAL  Final  Culture, respiratory (NON-Expectorated)     Status: None   Collection Time: 01/15/15  2:00 PM  Result Value Ref Range Status   Specimen Description TRACHEAL ASPIRATE  Final   Special Requests NONE  Final   Gram Stain   Final    RARE WBC PRESENT,BOTH PMN AND MONONUCLEAR NO ORGANISMS SEEN Performed at [facility] Performed at [facility]    Culture   Final    NO GROWTH 2 DAYS Performed at [facility]    Report Status 01/17/2015 FINAL  Final  Culture, blood (routine x 2)     Status: None   Collection Time: 01/17/15  3:00 PM  Result Value Ref Range Status   Specimen Description BLOOD LEFT HAND  Final   Special Requests BOTTLES DRAWN AEROBIC ONLY 3CC  Final   Culture   Final    NO GROWTH 5 DAYS Performed at [facility]    Report Status 01/23/2015 FINAL  Final  Culture, blood (routine x 2)     Status: None   Collection Time: 01/17/15  3:20 PM  Result Value Ref Range Status   Specimen Description BLOOD RIGHT HAND  Final   Special Requests BOTTLES DRAWN AEROBIC ONLY 10CC  Final   Culture   Final    NO GROWTH 5 DAYS Performed at [facility]    Report Status 01/23/2015 FINAL  Final  Clostridium Difficile by PCR     Status: None   Collection Time: 01/18/15  5:55 AM  Result Value Ref Range Status   C difficile by pcr NEGATIVE NEGATIVE Final  Clostridium Difficile by PCR     Status: None   Collection Time: 01/18/15  6:00 PM  Result Value Ref Range Status   C difficile by pcr NEGATIVE NEGATIVE Final  Clostridium Difficile by PCR     Status: None   Collection Time: 01/24/15  5:47 AM  Result Value Ref Range Status   C difficile by pcr NEGATIVE NEGATIVE Final   Assessment: 54 y.o male presented to ED 5/18 PM with chief complaint of SOB and cough + productive cough, fever, and generalized weakness x 4 - 5 days. Found to be  positive for legionella antigen as well as MSSA in BAL - both sensitive to Levofloxacin. Today is abx day #14 (planned for 21 days - end date 02/01/15). Patient has been afebrile for multiple days with a normal WBC x 3 days.  Patient has been on levofloxacin IV since 01/11/15 and is an appropriate candidate to switch to PO therapy.   Goal of Therapy:  Proper dosing based on hepatic and renal function  Plan:  - Convert to levofloxacin PO 750 mg daily - End date of 02/01/15 - Continue to monitor patient's renal function and clinical status - Pharmacy will sign-off, but are available for re-consultation if necessary  Isaac Bliss, PharmD, BCPS Clinical Pharmacist Pager [phone] 01/24/2015 8:39 AM

## 2015-01-24 NOTE — Progress Notes (Signed)
Critical KCL 2.6 down from 3.5, paged Pulmonary Critical [phone]

## 2015-01-24 NOTE — Progress Notes (Signed)
eLink Physician-Brief Progress Note Patient Name: [name] P Sweeney DOB: 08/03/1961 MRN: [id number]   Date of Service  01/24/2015  HPI/Events of Note  Diarrhea.   eICU Interventions  Will order C. Difficile Toxin.     Intervention Category Minor Interventions: Routine modifications to care plan (e.g. PRN medications for pain, fever)  [name] 01/24/2015, 4:40 AM

## 2015-01-24 NOTE — Progress Notes (Signed)
Pulmonary Critical Care returned page, advised KCL 2.6

## 2015-01-24 NOTE — Progress Notes (Signed)
Pt 3rd C-diff negative.

## 2015-01-24 NOTE — Progress Notes (Signed)
[facility] PROGRESS NOTE  PCCM/ICU Transfer 6/1  [name] BJY:[id number] DOB: 03/04/1961 DOA: 01/10/2015 PCP: Kristian CoveyBURCHETTE,BRUCE W, MD  Assessment/Plan: Acute hypoxemic respiratory failure  -From ARDS due to Legionella pneumonia and MSSA superinfection in trach aspirate -improved greatly, s/p VDRF -to complete 21days of levaquin for this, stop date 6/9 -off O2  ICU delirium -required precedex gtt -resolved  Diarrhea -suspect due to Legionella -improving, Cdiff PCR negative  DM -stable, continue lantus, SSI  Hypokalemia -due to diarrhea -replace  Chronic pain -resume Home meds, Morphine sulphate, oxycodone and gabapentin  DVT proph: HepSQ  Code Status: Full Code Family Communication: none at bedside Disposition Plan: CIR tomorrow if stable   Consultants:  PCCM signed off  HPI/Subjective: Diarrhea starting to improve  Objective: Filed Vitals:   01/24/15 0933  BP: 126/80  Pulse: 60  Temp: 98.2 F (36.8 C)  Resp: 18    Intake/Output Summary (Last 24 hours) at 01/24/15 1531 Last data filed at 01/24/15 1215  Gross per 24 hour  Intake    360 ml  Output    220 ml  Net    140 ml   Filed Weights   01/21/15 0500 01/22/15 0500 01/23/15 0342  Weight: 81.5 kg (179 lb 10.8 oz) 81.2 kg (179 lb 0.2 oz) 81.1 kg (178 lb 12.7 oz)    Exam:   General:  AAOx3  Cardiovascular: S1S2/RRR  Respiratory: some ronchi at bases  Abdomen: soft,  NT, Bs present  Musculoskeletal: no edema c/c   Data Reviewed: Basic Metabolic Panel:  Recent Labs Lab 01/20/15 0528 01/21/15 0245 01/22/15 0215 01/23/15 0520 01/24/15 0616  NA 147* 148* 148* 144 143  K 3.4* 3.2* 4.0 3.5 2.6*  CL 110 112* 111 110 108  CO2 29 28 28 25 24   GLUCOSE 205* 134* 117* 131* 148*  BUN 25* 23* 21* 24* 11  CREATININE 0.78 0.72 0.89 0.85 0.84  CALCIUM 9.1 9.1 9.1 8.4* 8.7*  MG 1.5*  --   --   --  1.3*  PHOS 3.7  --   --   --  3.6   Liver Function Tests:  Recent Labs Lab  01/18/15 0457 01/19/15 0446 01/22/15 0215  AST 89* 85* 62*  ALT 38 38 32  ALKPHOS 459* 396* 329*  BILITOT 2.1* 2.5* 1.8*  PROT 5.4* 5.4* 6.6  ALBUMIN 1.8* 2.0* 2.2*   No results for input(s): LIPASE, AMYLASE in the last 168 hours. No results for input(s): AMMONIA in the last 168 hours. CBC:  Recent Labs Lab 01/19/15 0446 01/21/15 0245 01/22/15 0215 01/23/15 0520 01/24/15 0616  WBC 9.2 7.5 6.5 7.2 4.6  NEUTROABS 7.2 5.2 4.8 4.6 2.8  HGB 9.8* 10.0* 10.6* 10.3* 10.4*  HCT 31.0* 32.4* 33.6* 31.7* 32.4*  MCV 101.0* 100.9* 99.4 98.4 97.9  PLT 189 211 227 180 227   Cardiac Enzymes: No results for input(s): CKTOTAL, CKMB, CKMBINDEX, TROPONINI in the last 168 hours. BNP (last 3 results)  Recent Labs  01/10/15 2122  BNP 92.4    ProBNP (last 3 results) No results for input(s): PROBNP in the last 8760 hours.  CBG:  Recent Labs Lab 01/23/15 2005 01/24/15 0148 01/24/15 0417 01/24/15 0737 01/24/15 1155  GLUCAP 166* 171* 141* 125* 226*    Recent Results (from the past 240 hour(s))  Stat Gram stain     Status: None   Collection Time: 01/15/15  2:00 PM  Result Value Ref Range Status   Specimen Description TRACHEAL ASPIRATE  Final  Special Requests Normal  Final   Gram Stain   Final    RARE WBC PRESENT,BOTH PMN AND MONONUCLEAR NO ORGANISMS SEEN    Report Status 01/15/2015 FINAL  Final  Culture, respiratory (NON-Expectorated)     Status: None   Collection Time: 01/15/15  2:00 PM  Result Value Ref Range Status   Specimen Description TRACHEAL ASPIRATE  Final   Special Requests NONE  Final   Gram Stain   Final    RARE WBC PRESENT,BOTH PMN AND MONONUCLEAR NO ORGANISMS SEEN Performed at [facility] Performed at [facility]    Culture   Final    NO GROWTH 2 DAYS Performed at [facility]    Report Status 01/17/2015 FINAL  Final  Culture, blood (routine x 2)     Status: None   Collection Time: 01/17/15  3:00 PM  Result Value Ref Range  Status   Specimen Description BLOOD LEFT HAND  Final   Special Requests BOTTLES DRAWN AEROBIC ONLY 3CC  Final   Culture   Final    NO GROWTH 5 DAYS Performed at [facility]    Report Status 01/23/2015 FINAL  Final  Culture, blood (routine x 2)     Status: None   Collection Time: 01/17/15  3:20 PM  Result Value Ref Range Status   Specimen Description BLOOD RIGHT HAND  Final   Special Requests BOTTLES DRAWN AEROBIC ONLY 10CC  Final   Culture   Final    NO GROWTH 5 DAYS Performed at [facility]    Report Status 01/23/2015 FINAL  Final  Clostridium Difficile by PCR     Status: None   Collection Time: 01/18/15  5:55 AM  Result Value Ref Range Status   C difficile by pcr NEGATIVE NEGATIVE Final  Clostridium Difficile by PCR     Status: None   Collection Time: 01/18/15  6:00 PM  Result Value Ref Range Status   C difficile by pcr NEGATIVE NEGATIVE Final  Clostridium Difficile by PCR     Status: None   Collection Time: 01/24/15  5:47 AM  Result Value Ref Range Status   C difficile by pcr NEGATIVE NEGATIVE Final     Studies: Dg Chest Port 1 View  01/23/2015   CLINICAL DATA:  Acute respiratory failure, hypoxia, lesion Ella pneumonia, ARDS.  EXAM: PORTABLE CHEST - 1 VIEW  COMPARISON:  Portable chest x-ray of Jan 21, 2015  FINDINGS: There has been interval extubation of the trachea and of the esophagus. The lungs are adequately inflated. The hemidiaphragms are better demonstrated today. There is subsegmental atelectasis at the lung bases not greatly changed from the previous study. The cardiac silhouette remains enlarged. The pulmonary vascularity is significantly nor 2. There are fusion devices associated with the mid thoracic spine.  IMPRESSION: Interval improvement in the appearance of both lungs since extubation. There is persistent mild cardiomegaly and bibasilar subsegmental atelectasis.   Electronically Signed   By: David  [place] M.D.   On: 01/23/2015 07:25     Scheduled Meds: . antiseptic oral rinse  7 mL Mouth Rinse q12n4p  . baclofen  10 mg Per Tube TID  . budesonide (PULMICORT) nebulizer solution  0.5 mg Nebulization BID  . chlorhexidine  15 mL Mouth Rinse BID  . gabapentin  300 mg Oral TID  . heparin  5,000 Units Subcutaneous 3 times per day  . insulin aspart  0-15 Units Subcutaneous 6 times per day  . insulin glargine  15 Units Subcutaneous QHS  . [START ON 01/25/2015] levofloxacin  750 mg Oral Q24H  . morphine  15 mg Oral 3 times per day  . sertraline  100 mg Per Tube Daily   Continuous Infusions:  Antibiotics Given (last 72 hours)    Date/Time Action Medication Dose Rate   01/21/15 2355 Given   levofloxacin (LEVAQUIN) IVPB 750 mg 750 mg 100 mL/hr   01/22/15 2342 Given   levofloxacin (LEVAQUIN) IVPB 750 mg 750 mg 100 mL/hr   01/24/15 0603 Given   levofloxacin (LEVAQUIN) IVPB 750 mg 750 mg 100 mL/hr      Active Problems:   HCAP (healthcare-associated pneumonia)   ARDS (adult respiratory distress syndrome)   Respiratory failure   Acute respiratory failure with hypoxia   Acute respiratory failure   Elevated bilirubin   Pressure ulcer    Time spent:    [facility]  [facility] Pager [phone]. If 7PM-7AM, please contact night-coverage at [email or web address], password [facility] 01/24/2015, 3:31 PM  LOS: 13 days

## 2015-01-24 NOTE — Evaluation (Signed)
Occupational Therapy Evaluation Patient Details Name: [name] MRN: [id number] DOB: June 24, 1961 Today's Date: 01/24/2015    History of Present Illness Pt is a 54 y/o male who presented to [facility] with acute hypoxic respiratory failure and severe bilateral infiltrates. Pt was intubated 5/19-5/29. Pt was admitted for treatment of PNA and ARDS.   Clinical Impression   Pt was independent in self care, drove and could perform meal prep and housekeeping while his wife was at work.  Pt presents with impaired cognition, decreased activity tolerance and impaired balance interfering with ability to perform ADL and ADL transfers. Pt with multiple episodes of diarrhea with incontinence this session. Will follow acutely.    Follow Up Recommendations  Home health OT;Supervision/Assistance - 24 hour    Equipment Recommendations  None recommended by OT    Recommendations for Other Services       Precautions / Restrictions Precautions Precautions: Fall Restrictions Weight Bearing Restrictions: No      Mobility Bed Mobility               General bed mobility comments: pt sitting EOB upon arrival with bed alarm activated  Transfers Overall transfer level: Needs assistance   Transfers: Sit to/from Stand Sit to Stand: Min guard         General transfer comment: pt reaching for furniture/dynamap with ambulation    Balance   Sitting-balance support: Feet supported Sitting balance-Leahy Scale: Good Sitting balance - Comments: able to perform LB dressing without LOB     Standing balance-Leahy Scale: Poor                              ADL Overall ADL's : Needs assistance/impaired Eating/Feeding: Independent;Sitting   Grooming: Min guard;Wash/dry hands;Standing   Upper Body Bathing: Supervision/ safety;Sitting   Lower Body Bathing: Minimal assistance;Sit to/from stand   Upper Body Dressing : Supervision/safety;Sitting   Lower Body Dressing: Min guard;Sit  to/from stand   Toilet Transfer: Min guard;Ambulation;Comfort height toilet;Grab bars Toilet Transfer Details (indicate cue type and reason): pt with heavy reliance on grab bars for sit to stand  Toileting- Architect and Hygiene: Min guard;Sit to/from stand       Functional mobility during ADLs: Min guard General ADL Comments: Pt with 2 episodes of bowel incontinence requiring multiple changes of gown and socks and clean up.     Vision     Perception     Praxis      Pertinent Vitals/Pain Pain Assessment: Faces Faces Pain Scale: Hurts whole lot Pain Location: feet with touch Pain Descriptors / Indicators: Burning Pain Intervention(s): Monitored during session     Hand Dominance Right   Extremity/Trunk Assessment Upper Extremity Assessment Upper Extremity Assessment: Overall WFL for tasks assessed (mild tremor)   Lower Extremity Assessment Lower Extremity Assessment: Defer to PT evaluation   Cervical / Trunk Assessment Cervical / Trunk Assessment: Normal   Communication Communication Communication: No difficulties   Cognition Arousal/Alertness: Awake/alert Behavior During Therapy: Anxious Overall Cognitive Status: Impaired/Different from baseline Area of Impairment: Safety/judgement;Awareness         Safety/Judgement: Decreased awareness of safety;Decreased awareness of deficits Awareness: Emergent   General Comments: pt repeatedly getting OOB without assist, pulled out his IV per RN   General Comments       Exercises       Shoulder Instructions      Home Living Family/patient expects to be discharged to:: Private residence Living Arrangements:  Spouse/significant other Available Help at Discharge: Family;Available 24 hours/day Type of Home: House Home Access: Stairs to enter Entergy CorporationEntrance Stairs-Number of Steps: 3 Entrance Stairs-Rails: Right Home Layout: One level     Bathroom Shower/Tub: Chief Strategy OfficerTub/shower unit   Bathroom Toilet: Standard      Home Equipment: Environmental consultantWalker - 2 wheels;Walker - 4 wheels;Toilet riser;Cane - single point   Additional Comments: Wife plans to take a minimum of  2 weeks off of work to care for pt.      Prior Functioning/Environment Level of Independence: Independent        Comments: pt drives, reports being retired    OT Diagnosis: Generalized weakness;Cognitive deficits   OT Problem List: Decreased strength;Decreased activity tolerance;Impaired balance (sitting and/or standing);Decreased knowledge of use of DME or AE;Decreased cognition;Pain   OT Treatment/Interventions: Self-care/ADL training;Energy conservation;DME and/or AE instruction;Patient/family education;Balance training;Cognitive remediation/compensation    OT Goals(Current goals can be found in the care plan section) Acute Rehab OT Goals Patient Stated Goal: Return home. OT Goal Formulation: With patient Time For Goal Achievement: 02/07/15 Potential to Achieve Goals: Good  OT Frequency: Min 2X/week   Barriers to D/C:            Co-evaluation PT/OT/SLP Co-Evaluation/Treatment: Yes Reason for Co-Treatment: For patient/therapist safety   OT goals addressed during session: ADL's and self-care      End of Session Equipment Utilized During Treatment: Gait belt  Activity Tolerance: Patient tolerated treatment well Patient left: in chair;with call bell/phone within reach;with chair alarm set;with family/visitor present   Time: 1150-1236 OT Time Calculation (min): 46 min Charges:  OT General Charges $OT Visit: 1 Procedure OT Evaluation $Initial OT Evaluation Tier I: 1 Procedure G-Codes:    [name], Mairely Foxworth Lynn 01/24/2015, 1:13 PM  [phone]

## 2015-01-24 NOTE — Progress Notes (Signed)
Paged Dr. Jomarie LongsJoseph pt KCL 2.6 down from 3.5, c-diff neg may pt have something for diarrhea.

## 2015-01-24 NOTE — Care Management Note (Signed)
Case Management Note  Patient Details  Name: [name] MRN: [id number] Date of Birth: 04/05/1961  Subjective/Objective:                    Action/Plan:   Expected Discharge Date:                  Expected Discharge Plan:     In-House Referral:     Discharge planning Services     Post Acute Care Choice:  Home Health Choice offered to:     DME Arranged:    DME Agency:     HH Arranged:    HH Agency:     Status of Service:     Medicare Important Message Given:    Date Medicare IM Given:    Medicare IM give by:    Date Additional Medicare IM Given:    Additional Medicare Important Message give by:     If discussed at Long Length of Stay Meetings, dates discussed:  01/23/2015  Additional Comments:  Yvone Neurutchfield, Neoma Uhrich M, RN 01/24/2015, 9:26 AM

## 2015-01-24 NOTE — Progress Notes (Signed)
I met with pt and his wife at bedside after he worked with PT and OT. Pt has progressed well over past 48 hrs. No longer in need of an inpt rehab admission. We recommend HH. Wife not working until 6/14 and states can take FMLA from [employer] in [place] if needed.We will sign off. RN CM and SW are aware. [phone]

## 2015-01-24 NOTE — Progress Notes (Signed)
No ongoing PCCM issues Please call if we can be of further assistance  [name] Fischeravid Simonds, MD ; [facility] service Mobile [phone].  After 5:30 PM or weekends, call [phone]

## 2015-01-24 NOTE — Progress Notes (Signed)
Physical Therapy Treatment Patient Details Name: [name] MRN: [id number] DOB: 1960/09/04 Today's Date: 01/24/2015    History of Present Illness Pt is a 54 y/o male who presented to [facility] with acute hypoxic respiratory failure and severe bilateral infiltrates. Pt was intubated 5/19-5/29. Pt was admitted for treatment of PNA and ARDS.    PT Comments    Has made excellent progress with mobiity and activity tolerance over the last 24 hours; Will have adequate supervision at home; Have updated dc plan  Follow Up Recommendations  Home health PT;Supervision/Assistance - 24 hour     Equipment Recommendations  None recommended by PT    Recommendations for Other Services       Precautions / Restrictions Precautions Precautions: Fall    Mobility  Bed Mobility                  Transfers Overall transfer level: Needs assistance Equipment used: None Transfers: Sit to/from Stand Sit to Stand: Min guard         General transfer comment: pt reaching for furniture/dynamap with ambulation  Ambulation/Gait Ambulation/Gait assistance: Min guard Ambulation Distance (Feet): 50 Feet (around room multiple times) Assistive device: None Gait Pattern/deviations: Step-through pattern (slitghtly wider base of support)   Gait velocity interpretation: Below normal speed for age/gender General Gait Details: unsteady intially, and tending to reach out for UE support; with more time up he is more steady, but minguard for safety; States he will use Rollator RW at home as necessary   Stairs            Wheelchair Mobility    Modified Rankin (Stroke Patients Only)       Balance     Sitting balance-Leahy Scale: Good       Standing balance-Leahy Scale:  (approaching Fair)                      Cognition Arousal/Alertness: Awake/alert Behavior During Therapy: Anxious Overall Cognitive Status: Impaired/Different from baseline Area of Impairment:  Safety/judgement;Awareness         Safety/Judgement: Decreased awareness of safety;Decreased awareness of deficits Awareness: Emergent   General Comments: pt repeatedly getting OOB without assist, pulled out his IV per RN    Exercises      General Comments        Pertinent Vitals/Pain Pain Assessment: Faces Faces Pain Scale: Hurts whole lot Pain Location: bil feet with light touch Pain Descriptors / Indicators: Burning Pain Intervention(s): Monitored during session    Home Living                      Prior Function            PT Goals (current goals can now be found in the care plan section) Acute Rehab PT Goals Patient Stated Goal: Return home. PT Goal Formulation: With patient Time For Goal Achievement: 02/05/15 Potential to Achieve Goals: Good Progress towards PT goals: Progressing toward goals    Frequency  Min 3X/week    PT Plan Discharge plan needs to be updated    Co-evaluation PT/OT/SLP Co-Evaluation/Treatment: Yes Reason for Co-Treatment: For patient/therapist safety PT goals addressed during session: Mobility/safety with mobility;Balance       End of Session Equipment Utilized During Treatment: Gait belt Activity Tolerance: Patient tolerated treatment well Patient left: in chair;with call bell/phone within reach;with chair alarm set;with family/visitor present     Time: 4098-1191 PT Time Calculation (min) (ACUTE ONLY): 44 min  Charges:  $Gait Training: 8-22 mins $Therapeutic Activity: 8-22 mins                    G Codes:      Olen PelGarrigan, Lydie Stammen Hamff 01/24/2015, 4:13 PM  Van ClinesHolly Elisheba Mcdonnell, [place]PT  Acute Rehabilitation Services Pager [phone] Office [phone]

## 2015-01-25 LAB — CBC WITH DIFFERENTIAL/PLATELET
BASOS PCT: 0 % (ref 0–1)
Basophils Absolute: 0 10*3/uL (ref 0.0–0.1)
Eosinophils Absolute: 0.1 10*3/uL (ref 0.0–0.7)
Eosinophils Relative: 2 % (ref 0–5)
HCT: 32.6 % — ABNORMAL LOW (ref 39.0–52.0)
HEMOGLOBIN: 10.3 g/dL — AB (ref 13.0–17.0)
Lymphocytes Relative: 43 % (ref 12–46)
Lymphs Abs: 1.8 10*3/uL (ref 0.7–4.0)
MCH: 30.9 pg (ref 26.0–34.0)
MCHC: 31.6 g/dL (ref 30.0–36.0)
MCV: 97.9 fL (ref 78.0–100.0)
MONO ABS: 0.4 10*3/uL (ref 0.1–1.0)
MONOS PCT: 10 % (ref 3–12)
NEUTROS ABS: 1.8 10*3/uL (ref 1.7–7.7)
NEUTROS PCT: 45 % (ref 43–77)
Platelets: 210 10*3/uL (ref 150–400)
RBC: 3.33 MIL/uL — ABNORMAL LOW (ref 4.22–5.81)
RDW: 15.6 % — AB (ref 11.5–15.5)
WBC: 4.2 10*3/uL (ref 4.0–10.5)

## 2015-01-25 LAB — COMPREHENSIVE METABOLIC PANEL
ALT: 36 U/L (ref 17–63)
AST: 70 U/L — AB (ref 15–41)
Albumin: 2.5 g/dL — ABNORMAL LOW (ref 3.5–5.0)
Alkaline Phosphatase: 288 U/L — ABNORMAL HIGH (ref 38–126)
Anion gap: 10 (ref 5–15)
BUN: 6 mg/dL (ref 6–20)
CALCIUM: 8.2 mg/dL — AB (ref 8.9–10.3)
CO2: 21 mmol/L — ABNORMAL LOW (ref 22–32)
CREATININE: 0.74 mg/dL (ref 0.61–1.24)
Chloride: 108 mmol/L (ref 101–111)
GFR calc Af Amer: >60 mL/min (ref >60)
Glucose, Bld: 75 mg/dL (ref 65–99)
POTASSIUM: 3.5 mmol/L (ref 3.5–5.1)
Sodium: 139 mmol/L (ref 135–145)
TOTAL PROTEIN: 6 g/dL — AB (ref 6.5–8.1)
Total Bilirubin: 1.2 mg/dL (ref 0.3–1.2)

## 2015-01-25 LAB — GLUCOSE, CAPILLARY
GLUCOSE-CAPILLARY: 107 mg/dL — AB (ref 65–99)
GLUCOSE-CAPILLARY: 134 mg/dL — AB (ref 65–99)
GLUCOSE-CAPILLARY: 82 mg/dL (ref 65–99)
Glucose-Capillary: 123 mg/dL — ABNORMAL HIGH (ref 65–99)
Glucose-Capillary: 127 mg/dL — ABNORMAL HIGH (ref 65–99)

## 2015-01-25 LAB — MAGNESIUM: Magnesium: 1.4 mg/dL — ABNORMAL LOW (ref 1.7–2.4)

## 2015-01-25 LAB — PHOSPHORUS: PHOSPHORUS: 3.8 mg/dL (ref 2.5–4.6)

## 2015-01-25 MED ORDER — INSULIN GLARGINE 100 UNIT/ML SOLOSTAR PEN: 15.0000 [IU] | PEN_INJECTOR | Freq: Every day | SUBCUTANEOUS | Status: DC

## 2015-01-25 MED ORDER — GABAPENTIN 400 MG PO CAPS: 400.0000 mg | ORAL_CAPSULE | Freq: Three times a day (TID) | ORAL | Status: DC

## 2015-01-25 MED ORDER — LEVOFLOXACIN 750 MG PO TABS: 750.0000 mg | ORAL_TABLET | ORAL | Status: DC

## 2015-01-25 MED ORDER — OXYCODONE HCL 5 MG PO TABS: 5.0000 mg | ORAL_TABLET | ORAL | Status: DC | PRN

## 2015-01-25 MED ORDER — INSULIN ASPART 100 UNIT/ML FLEXPEN: 4.0000 [IU] | PEN_INJECTOR | Freq: Three times a day (TID) | SUBCUTANEOUS | Status: DC

## 2015-01-25 NOTE — Discharge Summary (Signed)
Physician Discharge Summary  [name] NWG:[id number] DOB: 24-Jul-1961 DOA: 01/10/2015  PCP: [name], MD  Admit date: 01/10/2015 Discharge date: 01/25/2015  Time spent:  Recommendations for Outpatient Follow-up:  1. Stop levaquin 6/9 2. Please check Cmet in 1 week to FU Alkaline phosphatase 3. Titrate insulin dose   Discharge Diagnoses:  Active Problems:   Legionella  pneumonia   ARDS (adult respiratory distress syndrome)   Acute respiratory failure with hypoxia   ICU delirium   Elevated alkaline phosphatase   Elevated bilirubin   Pressure ulcer   Chronic pain  Discharge Condition: stable  Diet recommendation: diabetic  Filed Weights   01/22/15 0500 01/23/15 0342 01/24/15 2008  Weight: 81.2 kg (179 lb 0.2 oz) 81.1 kg (178 lb 12.7 oz) 73.936 kg (163 lb)    History of present illness:  CHIEF COMPLAINT: SOB, cough 54 y.o. M presented to [facility] ED late 5/18 PM with acute hypoxic respiratory failure and severe bilateral infiltrates. Required intubation in AM 5/19  MAJOR EVENTS/TEST RESULTS:  Hospital Course:  Acute hypoxemic respiratory failure  -From ARDS due to Legionella pneumonia and MSSA superinfection in trach aspirate -improved greatly, s/p VDRF -to complete 21days of levaquin for this, stop date 6/9 -weaned off O2  ICU delirium -required precedex gtt -resolved  Diarrhea -due to Legionella -improved, Cdiff PCR negative  Elevated alkaline phosphatase -etiology unclear, suspected to be from legionella -had [place] while in ICU which was unremarkable -s/p cholecystectomy, improving, needs this rechecked as outpatient  DM -stable, continue lantus, SSI -dose adjusted based on inpatient CBGs  Hypokalemia -due to diarrhea -replaced  Chronic pain -resume Home meds, Morphine sulphate, oxycodone and gabapentin -changed gabapentin from  TID to  TID   Consultations:  PCCM transfer  Discharge Exam: Filed Vitals:   01/25/15  0736  BP: 125/73  Pulse: 65  Temp: 98.2 F (36.8 C)  Resp: 17    General: AAOx3 Cardiovascular: S1s2/RRR Respiratory: ronchi at R base  Discharge Instructions   Discharge Instructions    Diet Carb Modified    Complete by:  As directed      Increase activity slowly    Complete by:  As directed           Current Discharge Medication List    START taking these medications   Details  levofloxacin (LEVAQUIN) 750 MG tablet Take 1 tablet (750 mg total) by mouth daily. For 7 days Qty: 7 tablet, Refills: 0      CONTINUE these medications which have CHANGED   Details  gabapentin (NEURONTIN) 400 MG capsule Take 1 capsule (400 mg total) by mouth 3 (three) times daily. Qty: 30 capsule, Refills: 0    insulin aspart (NOVOLOG FLEXPEN) 100 UNIT/ML FlexPen Inject 4 Units into the skin 3 (three) times daily with meals. Qty: 9 mL, Refills: 6    Insulin Glargine (LANTUS SOLOSTAR) 100 UNIT/ML Solostar Pen Inject 15 Units into the skin daily. Qty: 15 mL, Refills: 2    oxyCODONE (OXY IR/ROXICODONE) 5 MG immediate release tablet Take 1 tablet (5 mg total) by mouth every 4 (four) hours as needed. Refills: 0      CONTINUE these medications which have NOT CHANGED   Details  albuterol (PROVENTIL HFA;VENTOLIN HFA) 108 (90 BASE) MCG/ACT inhaler Inhale 1 puff into the lungs every 6 (six) hours as needed for wheezing or shortness of breath.    baclofen (LIORESAL) 10 MG tablet Take 10 mg by mouth 3 (three) times daily.    Calcium  Carb-Cholecalciferol (CALCIUM-VITAMIN D) 600-400 MG-UNIT TABS Take 1 tablet by mouth daily.    cyanocobalamin 1000 MCG tablet Take 100 mcg by mouth daily.    ergocalciferol (VITAMIN D2) 50000 UNITS capsule Take 50,000 Units by mouth once a week. On Saturday    fluticasone (VERAMYST) 27.5 MCG/SPRAY nasal spray Place 2 sprays into the nose daily.    morphine (MS CONTIN) 15 MG 12 hr tablet Take 15 mg by mouth 3 (three) times daily.    pantoprazole (PROTONIX) 20 MG  tablet Take 20 mg by mouth daily.    ramipril (ALTACE) 5 MG capsule Take 5 mg by mouth daily.    sertraline (ZOLOFT) 100 MG tablet Take 100 mg by mouth daily.       STOP taking these medications     hydrochlorothiazide (MICROZIDE) 12.5 MG capsule      MORPHINE SULFATE PO        Allergies  Allergen Reactions  . Ketorolac Tromethamine     Hives (toradol)   Follow-up Information    Follow up with [name], MD. Schedule an appointment as soon as possible for a visit in 1 week.   Specialty:  Family Medicine   Why:  or PCP at [facility] information:   [address] [place] [address] [phone]        The results of significant diagnostics from this hospitalization (including imaging, microbiology, ancillary and laboratory) are listed below for reference.    Significant Diagnostic Studies: Dg Abd 1 View  01/16/2015   CLINICAL DATA:  Feeding tube advanced transpyloric.  EXAM: ABDOMEN - 1 VIEW  COMPARISON:  01/15/2015  FINDINGS: Single spot image demonstrates a feeding tube in place with the tip at the ligament of Treitz. Contrast was injected to verify placement.  IMPRESSION: Feeding tube tip in the distal duodenum at the ligament of Treitz.   Electronically Signed   By: Charlett Nose M.D.   On: 01/16/2015 15:18   Dg Chest Port 1 View  01/23/2015   CLINICAL DATA:  Acute respiratory failure, hypoxia, lesion Ella pneumonia, ARDS.  EXAM: PORTABLE CHEST - 1 VIEW  COMPARISON:  Portable chest x-ray of Jan 21, 2015  FINDINGS: There has been interval extubation of the trachea and of the esophagus. The lungs are adequately inflated. The hemidiaphragms are better demonstrated today. There is subsegmental atelectasis at the lung bases not greatly changed from the previous study. The cardiac silhouette remains enlarged. The pulmonary vascularity is significantly nor 2. There are fusion devices associated with the mid thoracic spine.  IMPRESSION: Interval improvement in the  appearance of both lungs since extubation. There is persistent mild cardiomegaly and bibasilar subsegmental atelectasis.   Electronically Signed   By: David  [place] M.D.   On: 01/23/2015 07:25   Dg Chest Port 1 View  01/21/2015   CLINICAL DATA:  Acute respiratory failure with hypoxia. Follow-up exam.  EXAM: PORTABLE CHEST - 1 VIEW  COMPARISON:  01/20/2015  FINDINGS: Airspace opacities at the lung bases have mildly improved. No new lung abnormalities. Probable small effusions. No pneumothorax.  Endotracheal tube tip projects 2 cm above the chronic. Enteric feeding tube tip projects in the distal duodenum at the ligament of Treitz.  IMPRESSION: 1. Mild improvement in lung base airspace opacity. Residual opacity may reflect atelectasis, pneumonia or a combination. Probable persistent small effusions. 2. Support apparatus is stable and well positioned.   Electronically Signed   By: Amie Portland M.D.   On: 01/21/2015 07:33  Dg Chest Port 1 View  01/20/2015   CLINICAL DATA:  Followup pneumonia.  EXAM: PORTABLE CHEST - 1 VIEW  COMPARISON:  01/19/2015  FINDINGS: There is persistent perihilar and lung base airspace interstitial type opacities. Lung volumes remain low. No new lung opacities. No pneumothorax.  Endotracheal tube tip projects 2 cm above the carina, stable.  Enteric feeding tube passes well below the diaphragm and below the included field of view.  Right internal jugular central venous line has been removed.  IMPRESSION: 1. Status post removal of the right internal jugular central venous line. 2. No other change from the previous day's study. 3. Persistent perihilar and lung base opacities consistent with atelectasis, pneumonia or a combination. 4. Support apparatus is stable and well positioned.   Electronically Signed   By: Amie Portland M.D.   On: 01/20/2015 09:29   Dg Chest Port 1 View  01/19/2015   CLINICAL DATA:  Endotracheal tube position.  EXAM: PORTABLE CHEST - 1 VIEW  COMPARISON:  01/19/2015   FINDINGS: Endotracheal tube is 2 cm above the carina. Right central line is unchanged. Bilateral perihilar and lower lobe airspace opacities, unchanged. Heart is mildly enlarged. No visible effusions or acute bony abnormality.  IMPRESSION: Advancement of the endotracheal tube, now 2 cm above the carina.  Bilateral perihilar and lower lobe opacities are stable.   Electronically Signed   By: Charlett Nose M.D.   On: 01/19/2015 10:52   Dg Chest Port 1 View  01/19/2015   CLINICAL DATA:  Pneumonia.  EXAM: PORTABLE CHEST - 1 VIEW  COMPARISON:  None.  FINDINGS: Endotracheal tube tip noted just above the thoracic inlet. Distal advancement over approximately 5 cm suggested. Feeding tube in stable position. Right IJ line in stable position with tip projected over right atrium. Mediastinum hilar structures stable. Stable cardiomegaly. Persistent bibasilar pulmonary infiltrates, right side greater than left. No pleural effusion or pneumothorax.  IMPRESSION: 1. Endotracheal tube tip just above the thoracic inlet. Advancement of approximately 5 cm suggested . 2. Persistent bibasilar pulmonary infiltrates, right side greater than left. Critical Value/emergent results were called by telephone at the time of interpretation on 01/19/2015 at 7:18 am to nurse Maylon Peppers, who verbally acknowledged these results.   Electronically Signed   By: Maisie Fus  Register   On: 01/19/2015 07:19   Dg Chest Port 1 View  01/18/2015   CLINICAL DATA:  Hypoxia  EXAM: PORTABLE CHEST - 1 VIEW  COMPARISON:  Jan 17, 2015  FINDINGS: Endotracheal tube tip is 4.8 cm above the carina. Feeding tube tip is below the diaphragm. Central catheter tip is in the right atrium. No pneumothorax. There is patchy airspace consolidation in both lower lobes, essentially stable. No new opacity is seen. Heart is upper normal in size with pulmonary vascularity within normal limits. No adenopathy. There old healed rib fractures on the right.  IMPRESSION: Tube and catheter  positions as described without pneumothorax. Bilateral lower lobe airspace disease, stable. No new opacity. No change in cardiac silhouette.   Electronically Signed   By: Bretta Bang III M.D.   On: 01/18/2015 08:04   Dg Chest Port 1 View  01/17/2015   CLINICAL DATA:  ARDS.  EXAM: PORTABLE CHEST - 1 VIEW  COMPARISON:  01/16/2015.  FINDINGS: Endotracheal tube, feeding tube, right IJ line in stable position. Heart size stable. Dense bibasilar pulmonary infiltrates are noted most consistent with pneumonia. No prominent pleural effusion. No pneumothorax. Prior thoracic spine fusion.  IMPRESSION: 1. Lines and tubes in  stable position. 2. Dense bibasilar pulmonary infiltrates most consistent with pneumonia. Bilateral pulmonary edema cannot be excluded.   Electronically Signed   By: Maisie Fus  Register   On: 01/17/2015 07:21   Dg Chest Port 1 View  01/16/2015   CLINICAL DATA:  Respiratory failure.  Intubation on 01/11/2015.  EXAM: PORTABLE CHEST - 1 VIEW  COMPARISON:  One day prior  FINDINGS: Endotracheal tube terminates 4.1 cm above carina. Feeding tube extends beyond the inferior aspect of the film. Right internal jugular line tip at low SVC versus high right atrium. Reverse apical lordotic positioning. Nasogastric tube has been removed.  Cardiomegaly accentuated by AP portable technique. No left and no definite right pleural fluid. No pneumothorax. Lower lobe predominant interstitial and airspace disease is similar, given differences in technique. Remote right rib trauma.  IMPRESSION: No change in cardiomegaly with interstitial and airspace disease. Pulmonary edema and/or infection.  Removal of nasogastric tube with placement of feeding tube. This extends beyond the inferior aspect of the film.   Electronically Signed   By: Jeronimo Greaves M.D.   On: 01/16/2015 16:50   Dg Chest Port 1 View  01/15/2015   CLINICAL DATA:  Respiratory failure.  EXAM: PORTABLE CHEST - 1 VIEW  COMPARISON:  01/14/2015.  FINDINGS:  Endotracheal tube, NG tube, right IJ line in stable position. Cardiomegaly with persistent unchanged bilateral pulmonary alveolar infiltrates. No prominent pleural effusion. No pneumothorax. Prior thoracic spine fusion.  IMPRESSION: 1. Lines and tubes in stable position. 2. Persistent cardiomegaly with bilateral prominent pulmonary alveolar infiltrates. These findings are consistent with pulmonary edema and/or bilateral pneumonia .   Electronically Signed   By: Maisie Fus  Register   On: 01/15/2015 07:14   Dg Chest Port 1 View  01/14/2015   CLINICAL DATA:  Respiratory failure.  Intubated.  EXAM: PORTABLE CHEST - 1 VIEW  COMPARISON:  Yesterday.  FINDINGS: Endotracheal tube in satisfactory position. Nasogastric tube tip in the proximal stomach. Right jugular catheter tip at the superior cavoatrial junction.  Significant decrease in bilateral airspace opacity. Decreased size of the cardiac silhouette, borderline enlarged. Fixation hardware is again overlying the mid chest. Old, healed left rib fractures.  IMPRESSION: Decreased bilateral probable pulmonary edema with mildly improved cardiomegaly.   Electronically Signed   By: Beckie Salts M.D.   On: 01/14/2015 09:05   Dg Chest Port 1 View  01/13/2015   CLINICAL DATA:  Acute respiratory failure with hypoxia. Shortness of breath.  EXAM: PORTABLE CHEST - 1 VIEW  COMPARISON:  Yesterday a at 1931 hour  FINDINGS: Endotracheal tube is 3.1 cm from the carina. Enteric tube in place, tip and side port below the diaphragm. Tip of the right central line in the SVC. Diffuse bilateral parenchymal opacities, slightly worsened in the lower lobes from prior exam. No large pleural effusion. No pneumothorax.  IMPRESSION: 1. Worsening bilateral airspace opacity in the lower lobes. 2. Endotracheal tube, enteric tube, right central line remain in place, unchanged in position.   Electronically Signed   By: Rubye Oaks M.D.   On: 01/13/2015 05:31   Dg Chest Port 1 View  01/12/2015    CLINICAL DATA:  ARDS.  EXAM: PORTABLE CHEST - 1 VIEW  COMPARISON:  01/12/2015  FINDINGS: Support devices are stable. Diffuse bilateral airspace opacities are again noted, unchanged. No visible effusions or pneumothorax. No acute bony abnormality.  IMPRESSION: Stable diffuse bilateral airspace opacities. Stable support devices.   Electronically Signed   By: Charlett Nose M.D.   On: 01/12/2015  19:51   Dg Chest Port 1 View  01/12/2015   CLINICAL DATA:  Respiratory failure  EXAM: PORTABLE CHEST - 1 VIEW  COMPARISON:  01/11/2015.  FINDINGS: Endotracheal tube, NG tube, right IJ line in stable position. Heart size stable. Diffuse dense bilateral pulmonary infiltrates are present. No interim change. No pleural effusion or pneumothorax. Prior thoracic spine fusion .  IMPRESSION: 1. Lines and tubes in stable position. 2. Persistent unchanged dense bilateral pulmonary infiltrates .   Electronically Signed   By: Maisie Fushomas  Register   On: 01/12/2015 07:27   Dg Chest Port 1 View  01/11/2015   CLINICAL DATA:  Acute respiratory failure  EXAM: PORTABLE CHEST - 1 VIEW  COMPARISON:  01/11/2015 625 hours  FINDINGS: Cardiac shadow is stable. Diffuse bilateral infiltrates are again identified. The endotracheal tube, nasogastric catheter and right subclavian central venous catheter are again noted and stable. No pneumothorax or sizable effusion is seen. Postsurgical changes are again noted.  IMPRESSION: No significant interval change from the prior study.   Electronically Signed   By: Alcide CleverMark  Lukens M.D.   On: 01/11/2015 16:26   Dg Chest Port 1 View  01/11/2015   CLINICAL DATA:  Endotracheal tube placement. Respiratory distress, ARDS.  EXAM: PORTABLE CHEST - 1 VIEW  COMPARISON:  Yesterday at 2254 hour  FINDINGS: Endotracheal tube 2.9 cm from the carina. Enteric tube in place, tip and side port below the diaphragm in the stomach. Tip of the right central line in the mid SVC. No pneumothorax. Diffuse bilateral airspace opacification,  progressed from prior exam. Cardiomediastinal contours are unchanged with borderline mild cardiomegaly. No large pleural effusion. Postsurgical change in the mid thoracic spine.  IMPRESSION: 1. Endotracheal tube, enteric tube, and right central line in place. No complication. 2. Worsening diffuse bilateral airspace disease, may reflect pulmonary edema, pneumonia, or ARDS.   Electronically Signed   By: Rubye OaksMelanie  Ehinger M.D.   On: 01/11/2015 06:40   Dg Chest Port 1 View  01/10/2015   CLINICAL DATA:  Acute onset of left-sided chest pain, fever and hypoxia. Shortness of breath and productive cough. Initial encounter.  EXAM: PORTABLE CHEST - 1 VIEW  COMPARISON:  Chest radiograph performed 07/16/2010  FINDINGS: The lungs are well-aerated. Diffuse bilateral airspace opacification may reflect pulmonary edema, diffuse pneumonia or ARDS. There is no evidence of pleural effusion or pneumothorax.  The cardiomediastinal silhouette is borderline normal in size. No acute osseous abnormalities are seen. Postoperative change is noted overlying the mid thoracic spine.  IMPRESSION: Diffuse bilateral airspace opacification may reflect pulmonary edema, diffuse pneumonia or ARDS.   Electronically Signed   By: Roanna RaiderJeffery  Chang M.D.   On: 01/10/2015 23:21   Dg Abd Portable 1v  01/15/2015   CLINICAL DATA:  Feeding tube advancement.  EXAM: PORTABLE ABDOMEN - 1 VIEW  COMPARISON:  Earlier the same date.  FINDINGS: 1720 hours. The feeding tube appears unchanged with its tip in the region of the distal stomach or duodenal bulb The nasogastric tube is unchanged within the gastric fundus or proximal body. The visualized bowel gas pattern is nonobstructive.  IMPRESSION: Feeding tube appears unchanged in position.   Electronically Signed   By: Carey BullocksWilliam  Veazey M.D.   On: 01/15/2015 17:37   Dg Abd Portable 1v  01/15/2015   CLINICAL DATA:  Feeding tube placement. Tube advanced 12 cm since the earlier film.  EXAM: PORTABLE ABDOMEN - 1 VIEW   COMPARISON:  Earlier film, same date.  FINDINGS: The feeding tube tip is in  the region of the duodenum bulb. The NG tube is in the fundal region of the stomach. Bibasilar infiltrates or atelectasis noted.  IMPRESSION: The feeding tube tip is in the region of the duodenum bulb.  Bibasilar infiltrates.   Electronically Signed   By: Rudie Meyer M.D.   On: 01/15/2015 15:21   Dg Abd Portable 1v  01/15/2015   CLINICAL DATA:  Feeding tube placement  EXAM: PORTABLE ABDOMEN - 1 VIEW  COMPARISON:  None.  FINDINGS: Dobbhoff feeding tube projects over the gastric air bubble just to the left of midline in the upper abdomen.  IMPRESSION: Feeding tube as described   Electronically Signed   By: Esperanza Heir M.D.   On: 01/15/2015 14:36   Dg Vangie Bicker G Tube Plc W/fl-no Rad  01/16/2015   CLINICAL DATA:    NASO G TUBE PLACEMENT WITH FLUORO  Fluoroscopy was utilized by the requesting physician.  No radiographic  interpretation.    US Abdomen Limited Ruq  01/11/2015   CLINICAL DATA:  Elevated bilirubin. Previous cholecystectomy. Ventilator dependent.  EXAM: US ABDOMEN LIMITED - RIGHT UPPER QUADRANT  COMPARISON:  Ultrasound the abdomen 10/02/2009  FINDINGS: Gallbladder:  Gallbladder is surgically absent.  Common bile duct:  Diameter: 4.2 mm  Liver:  Mildly dense echotexture without other features of fatty liver. No focal lesions are identified.  IMPRESSION: 1. Status post cholecystectomy. 2. No focal liver abnormalities. 3. Mildly echogenic renal parenchyma.   Electronically Signed   By: Norva Pavlov M.D.   On: 01/11/2015 13:25    Microbiology: Recent Results (from the past 240 hour(s))  Culture, blood (routine x 2)     Status: None   Collection Time: 01/17/15  3:00 PM  Result Value Ref Range Status   Specimen Description BLOOD LEFT HAND  Final   Special Requests BOTTLES DRAWN AEROBIC ONLY 3CC  Final   Culture   Final    NO GROWTH 5 DAYS Performed at [facility]    Report Status 01/23/2015 FINAL  Final   Culture, blood (routine x 2)     Status: None   Collection Time: 01/17/15  3:20 PM  Result Value Ref Range Status   Specimen Description BLOOD RIGHT HAND  Final   Special Requests BOTTLES DRAWN AEROBIC ONLY 10CC  Final   Culture   Final    NO GROWTH 5 DAYS Performed at [facility]    Report Status 01/23/2015 FINAL  Final  Clostridium Difficile by PCR     Status: None   Collection Time: 01/18/15  5:55 AM  Result Value Ref Range Status   C difficile by pcr NEGATIVE NEGATIVE Final  Clostridium Difficile by PCR     Status: None   Collection Time: 01/18/15  6:00 PM  Result Value Ref Range Status   C difficile by pcr NEGATIVE NEGATIVE Final  Clostridium Difficile by PCR     Status: None   Collection Time: 01/24/15  5:47 AM  Result Value Ref Range Status   C difficile by pcr NEGATIVE NEGATIVE Final     Labs: Basic Metabolic Panel:  Recent Labs Lab 01/20/15 0528 01/21/15 0245 01/22/15 0215 01/23/15 0520 01/24/15 0616 01/25/15 0609  NA 147* 148* 148* 144 143 139  K 3.4* 3.2* 4.0 3.5 2.6* 3.5  CL 110 112* 111 110 108 108  CO2 29 28 28 25 24  21*  GLUCOSE 205* 134* 117* 131* 148* 75  BUN 25* 23* 21* 24* 11 6  CREATININE 0.78 0.72 0.89  0.85 0.84 0.74  CALCIUM 9.1 9.1 9.1 8.4* 8.7* 8.2*  MG 1.5*  --   --   --  1.3* 1.4*  PHOS 3.7  --   --   --  3.6 3.8   Liver Function Tests:  Recent Labs Lab 01/19/15 0446 01/22/15 0215 01/25/15 0609  AST 85* 62* 70*  ALT 38 32 36  ALKPHOS 396* 329* 288*  BILITOT 2.5* 1.8* 1.2  PROT 5.4* 6.6 6.0*  ALBUMIN 2.0* 2.2* 2.5*   No results for input(s): LIPASE, AMYLASE in the last 168 hours. No results for input(s): AMMONIA in the last 168 hours. CBC:  Recent Labs Lab 01/21/15 0245 01/22/15 0215 01/23/15 0520 01/24/15 0616 01/25/15 0609  WBC 7.5 6.5 7.2 4.6 4.2  NEUTROABS 5.2 4.8 4.6 2.8 1.8  HGB 10.0* 10.6* 10.3* 10.4* 10.3*  HCT 32.4* 33.6* 31.7* 32.4* 32.6*  MCV 100.9* 99.4 98.4 97.9 97.9  PLT 211 227 180 227 210    Cardiac Enzymes: No results for input(s): CKTOTAL, CKMB, CKMBINDEX, TROPONINI in the last 168 hours. BNP: BNP (last 3 results)  Recent Labs  01/10/15 2122  BNP 92.4    ProBNP (last 3 results) No results for input(s): PROBNP in the last 8760 hours.  CBG:  Recent Labs Lab 01/24/15 2000 01/25/15 0053 01/25/15 0405 01/25/15 0719 01/25/15 1122  GLUCAP 107* 134* 123* 82 127*       Signed:  Tye Vigo  [facility] 01/25/2015, 2:44 PM

## 2015-01-25 NOTE — Care Management Note (Signed)
Case Management Note  Patient Details  Name: [name] MRN: [id number] Date of Birth: 07/20/1961  Subjective/Objective:             CM following for progression and d/c planning.       Action/Plan: 01/25/2015 Met with pt re HH needs, no DME needs and pt wishes to use [facility] for HHPT services. [facility] notified of need on 01/24/15 and called again today to notify of order for HHPT and plan to d/c this pt today.   Expected Discharge Date:       01/25/2015           Expected Discharge Plan:  Home w Home Health Services  In-House Referral:  NA  Discharge planning Services  CM Consult  Post Acute Care Choice:  Home Health Choice offered to:     DME Arranged:    DME Agency:     HH Arranged:  PT HH Agency:  [facility]  Status of Service:  Completed, signed off  Medicare Important Message Given:  Yes Date Medicare IM Given:  01/25/15 Medicare IM give by:    RN MPH, case manager, [phone] Date Additional Medicare IM Given:    Additional Medicare Important Message give by:     If discussed at Long Length of Stay Meetings, dates discussed:    Additional Comments:  ,  U, RN 01/25/2015, 12:25 PM  

## 2015-01-25 NOTE — Progress Notes (Signed)
Speech Language Pathology Treatment: Dysphagia  Patient Details Name: [name] MRN: [id number] DOB: 1961/06/11 Today's Date: 01/25/2015 Time: 1200-1210 SLP Time Calculation (min) (ACUTE ONLY): 10 min  Assessment / Plan / Recommendation Clinical Impression  Pt phonation has improved, but still mildly hoarse. Able to consume thin liquids with basic precautions independently, no signs of aspiration. Given that previous aspiration events were consistently sensed, recommend pt upgrade to regular diet with thin liquids with low risk of aspiration. No SLP f/u needed, will sign off.    HPI Other Pertinent Information: 54 y.o. M presented to [facility] ED late 5/18 PM with acute hypoxic respiratory failure and severe bilateral infiltrates. Required intubation in AM 5/19. Treated as legionella PNA, ARDS. Extubated 5/29.    Pertinent Vitals    SLP Plan  All goals met    Recommendations Diet recommendations: Regular;Thin liquid Liquids provided via: Cup;Straw Medication Administration: Whole meds with liquid Supervision: Patient able to self feed Compensations: Slow rate;Small sips/bites Postural Changes and/or Swallow Maneuvers: Out of bed for meals              Plan: All goals met    GO    Herbie Baltimore, MA CCC-SLP [phone]  Lynann Beaver 01/25/2015, 2:55 PM

## 2015-01-25 NOTE — Progress Notes (Signed)
PT Cancellation Note  Patient Details Name: [name] MRN: [id number] DOB: 05/22/1961   Cancelled Treatment:    Reason Eval/Treat Not Completed: Patient declined, no reason specified.  Stated he is leaving today and just doesn't want to do treatment, no real reason.   Ivar DrapeStout, Alexzander Dolinger E 01/25/2015, 11:40 AM   Samul Dadauth Jlen Wintle, PT MS Acute Rehab Dept. Number: [facility] [id number] and [facility] [phone]

## 2015-01-25 NOTE — Progress Notes (Signed)
Patient Discharge:  Disposition: Pt discharged home with wife  Education: Pt educated on medications, follow up appointments and all discharge instructions. Pt and wife verbalized understanding.   IV: Removed  Telemetry: N/A  Follow-up appointments: Reviewed with pt.  Prescriptions: Scripts given to pt.  Transportation: Transported home by wife  Belongings: All belongings taken with pt.

## 2015-01-29 ENCOUNTER — Telehealth: Payer: Self-pay | Admitting: Family Medicine

## 2015-01-29 NOTE — Telephone Encounter (Signed)
Eunice BlaseDebbie is informed. Pt has appointment on 02/02/15

## 2015-01-29 NOTE — Telephone Encounter (Signed)
[name] needs a VO called to her to ok frequency for pt to be 1 x week for 1 week and 2 x a week for four weeks

## 2015-01-29 NOTE — Telephone Encounter (Signed)
Last visit 07/2013

## 2015-01-29 NOTE — Telephone Encounter (Signed)
This pt needs office follow up

## 2015-01-31 ENCOUNTER — Encounter: Payer: Self-pay | Admitting: Family

## 2015-01-31 ENCOUNTER — Ambulatory Visit (INDEPENDENT_AMBULATORY_CARE_PROVIDER_SITE_OTHER): Payer: Medicare Other | Admitting: Family

## 2015-01-31 ENCOUNTER — Telehealth: Payer: Self-pay | Admitting: Family Medicine

## 2015-01-31 ENCOUNTER — Ambulatory Visit (INDEPENDENT_AMBULATORY_CARE_PROVIDER_SITE_OTHER): Payer: Medicare Other

## 2015-01-31 VITALS — BP 104/73 | HR 109 | Temp 97.3°F | Ht 68.0 in | Wt 153.4 lb

## 2015-01-31 DIAGNOSIS — B999 Unspecified infectious disease: Secondary | ICD-10-CM

## 2015-01-31 DIAGNOSIS — A481 Legionnaires' disease: Secondary | ICD-10-CM | POA: Diagnosis not present

## 2015-01-31 DIAGNOSIS — E1165 Type 2 diabetes mellitus with hyperglycemia: Secondary | ICD-10-CM

## 2015-01-31 MED ORDER — INSULIN ASPART 100 UNIT/ML FLEXPEN: PEN_INJECTOR | SUBCUTANEOUS | Status: DC

## 2015-01-31 MED ORDER — INSULIN GLARGINE 100 UNIT/ML SOLOSTAR PEN: 15.0000 [IU] | PEN_INJECTOR | Freq: Every day | SUBCUTANEOUS | Status: DC

## 2015-01-31 NOTE — Progress Notes (Signed)
   Subjective:    Patient ID: [name], male    DOB: 07/27/1961, 54 y.o.   MRN: [id number]  HPI Pt presents to the office today to recheck HCAP due Legionella. Pt was admitted to the hospital on 5/18 and had to be intubated. Pt was given a 21 days of Levaquin. Pt reports feeling weak and fatigued. Pt is doing PT twice a week. Pt reports losing about 30 lbs before going into the hospital. Pt denies any headache, cough, palpitations, SOB, or edema at this time.   *I reviewed hospital  Notes.  Review of Systems  Constitutional: Negative.   HENT: Negative.   Respiratory: Negative.   Cardiovascular: Negative.   Gastrointestinal: Negative.   Endocrine: Negative.   Genitourinary: Negative.   Musculoskeletal: Negative.   Neurological: Negative.   Hematological: Negative.   Psychiatric/Behavioral: Negative.   All other systems reviewed and are negative.      Objective:   Physical Exam  Constitutional: He is oriented to person, place, and time. He appears well-developed and well-nourished. No distress.  HENT:  Head: Normocephalic.  Right Ear: External ear normal.  Left Ear: External ear normal.  Mouth/Throat: Oropharynx is clear and moist.  Eyes: Pupils are equal, round, and reactive to light. Right eye exhibits no discharge. Left eye exhibits no discharge.  Neck: Normal range of motion. Neck supple. No thyromegaly present.  Cardiovascular: Normal rate, regular rhythm, normal heart sounds and intact distal pulses.   No murmur heard. Pulmonary/Chest: Effort normal and breath sounds normal. No respiratory distress. He has no wheezes.  Abdominal: Soft. Bowel sounds are normal. He exhibits no distension. There is no tenderness.  Musculoskeletal: Normal range of motion. He exhibits no edema or tenderness.  Neurological: He is alert and oriented to person, place, and time. He has normal reflexes. No cranial nerve deficit.  Skin: Skin is warm and dry. No rash noted. No erythema. There  is pallor.  Psychiatric: He has a normal mood and affect. His behavior is normal. Judgment and thought content normal.  Vitals reviewed.   BP 104/73 mmHg  Pulse 109  Temp(Src) 97.3 F (36.3 C) (Oral)  Ht 5\' 8"  (1.727 m)  Wt 153 lb 6.4 oz (69.582 kg)  BMI 23.33 kg/m2   Chest X-ray- Chest X-ray improved- Preliminary reading by Jannifer Rodneyhristy Seann Genther, FNP [facility]     Assessment & Plan:  1. Infection - DG Chest 2 View; Future - L. Pneumophila Serogp 1 Ur Ag  2. Legionella pneumonia   Force fluids Rest Continue with Physical Therapy Pt continue Mucinex If develops fever, SOB, or cough RTO  RTO for chronic follow up  Jannifer Rodneyhristy Alecea Trego, FNP

## 2015-01-31 NOTE — Patient Instructions (Signed)

## 2015-01-31 NOTE — Telephone Encounter (Signed)
Pt given appt with Christy today at 11:40.

## 2015-02-02 ENCOUNTER — Telehealth: Payer: Self-pay | Admitting: *Deleted

## 2015-02-02 ENCOUNTER — Ambulatory Visit: Payer: BLUE CROSS/BLUE SHIELD | Admitting: Family Medicine

## 2015-02-02 LAB — LEGIONELLA PNEUMOPHILA SEROGP 1 UR AG: L. pneumophila Serogp 1 Ur Ag: POSITIVE — AB

## 2015-02-02 MED ORDER — CLARITHROMYCIN 500 MG PO TABS: 500.0000 mg | ORAL_TABLET | Freq: Two times a day (BID) | ORAL | Status: DC

## 2015-02-02 NOTE — Telephone Encounter (Signed)
Biaxin was sent in per Stacks - due to lab abnormal from 6/8 - pneumophilla serogp - positive.  This is mostly FYI for [facility] -  How would you recommend they follow up - i told pt you or nurse would contact them first of the week.

## 2015-02-05 NOTE — Telephone Encounter (Signed)
We received home health orders, however FYI, pt cancelled the 6/10 appt.

## 2015-02-05 NOTE — Telephone Encounter (Signed)
Left detailed message to fu after finishing abx per [facility]

## 2015-02-05 NOTE — Telephone Encounter (Signed)
Pt can follow up when he completes antibiotic

## 2015-02-06 NOTE — Telephone Encounter (Signed)
Left detailed message on Debbie VM

## 2015-02-13 ENCOUNTER — Other Ambulatory Visit: Payer: Medicare Other

## 2015-02-13 DIAGNOSIS — B999 Unspecified infectious disease: Secondary | ICD-10-CM | POA: Diagnosis not present

## 2015-02-13 DIAGNOSIS — A481 Legionnaires' disease: Secondary | ICD-10-CM

## 2015-02-13 NOTE — Progress Notes (Signed)
LAB ONLY 

## 2015-02-14 ENCOUNTER — Other Ambulatory Visit: Payer: Self-pay | Admitting: Family Medicine

## 2015-02-14 NOTE — Telephone Encounter (Signed)
Last seen 01/31/15 Neysa Bonito

## 2015-02-16 ENCOUNTER — Other Ambulatory Visit: Payer: Self-pay | Admitting: *Deleted

## 2015-02-16 ENCOUNTER — Other Ambulatory Visit: Payer: Self-pay | Admitting: Family

## 2015-02-16 ENCOUNTER — Telehealth: Payer: Self-pay | Admitting: Family Medicine

## 2015-02-16 DIAGNOSIS — A481 Legionnaires' disease: Secondary | ICD-10-CM

## 2015-02-16 LAB — LEGIONELLA PNEUMOPHILA SEROGP 1 UR AG: L. pneumophila Serogp 1 Ur Ag: POSITIVE — AB

## 2015-02-16 MED ORDER — DOXYCYCLINE HYCLATE 100 MG PO TABS: 100.0000 mg | ORAL_TABLET | Freq: Two times a day (BID) | ORAL | Status: DC

## 2015-03-05 ENCOUNTER — Other Ambulatory Visit: Payer: Medicare Other

## 2015-03-05 DIAGNOSIS — B999 Unspecified infectious disease: Secondary | ICD-10-CM | POA: Diagnosis not present

## 2015-03-05 DIAGNOSIS — A481 Legionnaires' disease: Secondary | ICD-10-CM | POA: Diagnosis not present

## 2015-03-05 NOTE — Addendum Note (Signed)
Addended by: Monica BectonHODGES, Chena Chohan F on: 03/05/2015 06:31 PM   Modules accepted: Orders

## 2015-03-05 NOTE — Addendum Note (Signed)
Addended by: Kirtis Challis F on: 03/05/2015 06:29 PM   Modules accepted: Orders  

## 2015-03-05 NOTE — Addendum Note (Signed)
Addended by: Monica BectonHODGES, Wylie Coon F on: 03/05/2015 06:29 PM   Modules accepted: Orders

## 2015-03-08 ENCOUNTER — Telehealth: Payer: Self-pay | Admitting: Family

## 2015-03-08 DIAGNOSIS — E1165 Type 2 diabetes mellitus with hyperglycemia: Secondary | ICD-10-CM

## 2015-03-08 LAB — LEGIONELLA PNEUMOPHILA SEROGP 1 UR AG: L. pneumophila Serogp 1 Ur Ag: POSITIVE — AB

## 2015-03-09 MED ORDER — INSULIN GLARGINE 100 UNIT/ML SOLOSTAR PEN: 37.0000 [IU] | PEN_INJECTOR | Freq: Every day | SUBCUTANEOUS | Status: DC

## 2015-03-09 MED ORDER — INSULIN ASPART 100 UNIT/ML FLEXPEN: PEN_INJECTOR | SUBCUTANEOUS | Status: DC

## 2015-03-09 NOTE — Telephone Encounter (Signed)
L[facility] and Novolog [facility]

## 2015-04-13 ENCOUNTER — Other Ambulatory Visit: Payer: Self-pay | Admitting: Family

## 2015-04-13 ENCOUNTER — Other Ambulatory Visit: Payer: Medicare Other

## 2015-04-13 DIAGNOSIS — A481 Legionnaires' disease: Secondary | ICD-10-CM | POA: Diagnosis not present

## 2015-04-13 NOTE — Progress Notes (Signed)
Lab only 

## 2015-04-13 NOTE — Addendum Note (Signed)
Addended by: Prescott Gum on: 04/13/2015 05:33 PM   Modules accepted: Orders

## 2015-04-13 NOTE — Addendum Note (Signed)
Addended by: Prescott Gum on: 04/13/2015 05:32 PM   Modules accepted: Orders

## 2015-04-16 LAB — LEGIONELLA PNEUMOPHILA SEROGP 1 UR AG: L. pneumophila Serogp 1 Ur Ag: POSITIVE — AB

## 2015-04-17 ENCOUNTER — Telehealth: Payer: Self-pay | Admitting: Family

## 2015-04-17 NOTE — Telephone Encounter (Signed)
Lab result available on urine. Positive result but not necessary to continue with any medications if no symptoms.

## 2015-06-08 ENCOUNTER — Telehealth: Payer: Self-pay | Admitting: Family

## 2015-06-08 DIAGNOSIS — A481 Legionnaires' disease: Secondary | ICD-10-CM

## 2015-06-08 NOTE — Telephone Encounter (Signed)
Referral placed.

## 2015-06-20 ENCOUNTER — Ambulatory Visit: Payer: Medicare Other | Admitting: Family Medicine

## 2015-06-21 ENCOUNTER — Ambulatory Visit: Payer: Medicare Other | Admitting: Family

## 2015-06-21 ENCOUNTER — Emergency Department (HOSPITAL_COMMUNITY): Payer: BLUE CROSS/BLUE SHIELD

## 2015-06-21 ENCOUNTER — Encounter (HOSPITAL_COMMUNITY): Payer: Self-pay | Admitting: *Deleted

## 2015-06-21 ENCOUNTER — Emergency Department (HOSPITAL_COMMUNITY)
Admission: EM | Admit: 2015-06-21 | Discharge: 2015-06-21 | Disposition: A | Discharge status: Home / Self Care | Payer: BLUE CROSS/BLUE SHIELD | Attending: Emergency Medicine | Admitting: Emergency Medicine

## 2015-06-21 DIAGNOSIS — K219 Gastro-esophageal reflux disease without esophagitis: Secondary | ICD-10-CM | POA: Diagnosis not present

## 2015-06-21 DIAGNOSIS — Z7951 Long term (current) use of inhaled steroids: Secondary | ICD-10-CM | POA: Insufficient documentation

## 2015-06-21 DIAGNOSIS — M5146 Schmorl's nodes, lumbar region: Secondary | ICD-10-CM

## 2015-06-21 DIAGNOSIS — Z87891 Personal history of nicotine dependence: Secondary | ICD-10-CM | POA: Diagnosis not present

## 2015-06-21 DIAGNOSIS — Z8781 Personal history of (healed) traumatic fracture: Secondary | ICD-10-CM | POA: Insufficient documentation

## 2015-06-21 DIAGNOSIS — M7989 Other specified soft tissue disorders: Secondary | ICD-10-CM | POA: Diagnosis not present

## 2015-06-21 DIAGNOSIS — I10 Essential (primary) hypertension: Secondary | ICD-10-CM | POA: Insufficient documentation

## 2015-06-21 DIAGNOSIS — Z9889 Other specified postprocedural states: Secondary | ICD-10-CM | POA: Diagnosis not present

## 2015-06-21 DIAGNOSIS — R079 Chest pain, unspecified: Secondary | ICD-10-CM | POA: Diagnosis not present

## 2015-06-21 DIAGNOSIS — M5441 Lumbago with sciatica, right side: Secondary | ICD-10-CM

## 2015-06-21 DIAGNOSIS — E119 Type 2 diabetes mellitus without complications: Secondary | ICD-10-CM | POA: Diagnosis not present

## 2015-06-21 DIAGNOSIS — M545 Low back pain: Secondary | ICD-10-CM | POA: Diagnosis not present

## 2015-06-21 DIAGNOSIS — Z794 Long term (current) use of insulin: Secondary | ICD-10-CM | POA: Diagnosis not present

## 2015-06-21 DIAGNOSIS — R0789 Other chest pain: Secondary | ICD-10-CM | POA: Diagnosis not present

## 2015-06-21 DIAGNOSIS — Z79899 Other long term (current) drug therapy: Secondary | ICD-10-CM | POA: Insufficient documentation

## 2015-06-21 DIAGNOSIS — M810 Age-related osteoporosis without current pathological fracture: Secondary | ICD-10-CM | POA: Diagnosis not present

## 2015-06-21 DIAGNOSIS — G43909 Migraine, unspecified, not intractable, without status migrainosus: Secondary | ICD-10-CM | POA: Insufficient documentation

## 2015-06-21 DIAGNOSIS — G8929 Other chronic pain: Secondary | ICD-10-CM | POA: Diagnosis not present

## 2015-06-21 LAB — CBC WITH DIFFERENTIAL/PLATELET
BASOS ABS: 0 10*3/uL (ref 0.0–0.1)
BASOS PCT: 0 %
EOS ABS: 0 10*3/uL (ref 0.0–0.7)
Eosinophils Relative: 1 %
HEMATOCRIT: 45.3 % (ref 39.0–52.0)
HEMOGLOBIN: 15.4 g/dL (ref 13.0–17.0)
Lymphocytes Relative: 21 %
Lymphs Abs: 1.4 10*3/uL (ref 0.7–4.0)
MCH: 33.2 pg (ref 26.0–34.0)
MCHC: 34 g/dL (ref 30.0–36.0)
MCV: 97.6 fL (ref 78.0–100.0)
MONO ABS: 0.7 10*3/uL (ref 0.1–1.0)
Monocytes Relative: 10 %
NEUTROS ABS: 4.7 10*3/uL (ref 1.7–7.7)
NEUTROS PCT: 68 %
Platelets: 138 10*3/uL — ABNORMAL LOW (ref 150–400)
RBC: 4.64 MIL/uL (ref 4.22–5.81)
RDW: 13.3 % (ref 11.5–15.5)
WBC: 6.9 10*3/uL (ref 4.0–10.5)

## 2015-06-21 LAB — BASIC METABOLIC PANEL
ANION GAP: 10 (ref 5–15)
BUN: 8 mg/dL (ref 6–20)
CALCIUM: 9.3 mg/dL (ref 8.9–10.3)
CO2: 29 mmol/L (ref 22–32)
CREATININE: 0.73 mg/dL (ref 0.61–1.24)
Chloride: 97 mmol/L — ABNORMAL LOW (ref 101–111)
GFR calc non Af Amer: >60 mL/min (ref >60)
Glucose, Bld: 257 mg/dL — ABNORMAL HIGH (ref 65–99)
Potassium: 4.2 mmol/L (ref 3.5–5.1)
SODIUM: 136 mmol/L (ref 135–145)

## 2015-06-21 LAB — CBG MONITORING, ED: Glucose-Capillary: 268 mg/dL — ABNORMAL HIGH (ref 65–99)

## 2015-06-21 MED ORDER — HYDROMORPHONE HCL 1 MG/ML IJ SOLN
1.0000 mg | INTRAMUSCULAR | Status: DC | PRN
  Administered 2015-06-21: 1 mg via INTRAVENOUS
  Filled 2015-06-21: qty 1

## 2015-06-21 MED ORDER — DEXAMETHASONE SODIUM PHOSPHATE 10 MG/ML IJ SOLN
10.0000 mg | Freq: Once | INTRAMUSCULAR | Status: AC
  Administered 2015-06-21: 10 mg via INTRAVENOUS
  Filled 2015-06-21: qty 1

## 2015-06-21 MED ORDER — OXYCODONE-ACETAMINOPHEN 5-325 MG PO TABS
ORAL_TABLET | ORAL | Status: AC
  Filled 2015-06-21: qty 1

## 2015-06-21 MED ORDER — METHYLPREDNISOLONE 4 MG PO TBPK: ORAL_TABLET | ORAL | Status: DC

## 2015-06-21 MED ORDER — METHOCARBAMOL 500 MG PO TABS: 500.0000 mg | ORAL_TABLET | Freq: Two times a day (BID) | ORAL | Status: DC

## 2015-06-21 MED ORDER — HYDROMORPHONE HCL 1 MG/ML IJ SOLN
1.0000 mg | Freq: Once | INTRAMUSCULAR | Status: AC
  Administered 2015-06-21: 1 mg via INTRAVENOUS
  Filled 2015-06-21: qty 1

## 2015-06-21 MED ORDER — METHOCARBAMOL 1000 MG/10ML IJ SOLN
1000.0000 mg | Freq: Once | INTRAVENOUS | Status: AC
  Administered 2015-06-21: 1000 mg via INTRAVENOUS
  Filled 2015-06-21: qty 10

## 2015-06-21 MED ORDER — METHOCARBAMOL 500 MG PO TABS: 500.0000 mg | ORAL_TABLET | Freq: Three times a day (TID) | ORAL | Status: DC | PRN

## 2015-06-21 MED ORDER — OXYCODONE-ACETAMINOPHEN 5-325 MG PO TABS
1.0000 | ORAL_TABLET | Freq: Once | ORAL | Status: AC
  Administered 2015-06-21: 1 via ORAL

## 2015-06-21 MED ORDER — ONDANSETRON HCL 4 MG/2ML IJ SOLN
4.0000 mg | Freq: Once | INTRAMUSCULAR | Status: AC
  Administered 2015-06-21: 4 mg via INTRAVENOUS
  Filled 2015-06-21: qty 2

## 2015-06-21 NOTE — ED Notes (Signed)
Discharge instructions/prescriptions reviewed with patient/spouse. Understanding verbalized.

## 2015-06-21 NOTE — ED Notes (Signed)
PT PLACED IN A GOWN AND HOOKED UP TO THE MONITOR WITH A 5 LEAD, BP CUFF AND PULSE OX

## 2015-06-21 NOTE — ED Provider Notes (Signed)
CSN: [id number]     Arrival date & time 06/21/15  1437 History  By signing my name below, I, [name], attest that this documentation has been prepared under the direction and in the presence of Kerrie Buffalo, NP.  Electronically Signed: [name], ED Scribe. 06/21/2015. 3:45 PM.   Chief Complaint  Patient presents with  . Back Pain   Patient is a 54 y.o. male presenting with back pain. The history is provided by the patient. No language interpreter was used.  Back Pain Location:  Lumbar spine Radiates to:  Does not radiate Pain severity:  Severe Pain is:  Same all the time Onset quality:  Gradual Duration:  7 days Timing:  Constant Progression:  Worsening Chronicity:  New Relieved by:  Nothing Worsened by:  Ambulation and sitting Ineffective treatments:  Narcotics Associated symptoms: chest pain   Associated symptoms: no abdominal pain, no bladder incontinence, no bowel incontinence, no fever, no numbness, no paresthesias, no perianal numbness and no weakness   Risk factors: hx of osteoporosis    HPI Comments: [name] is a 54 y.o. male, with a PMhx of DM, HTN, sciatica, and osteoporosis, who presents to the Emergency Department complaining of gradually worsening, constant, 8/10, non-radiating midline lower back pain onset 7 days ago with waking up. The pt states his current pain is different than his chronic sciatica pain that normally radiates down his right leg posteriorly. He is wearing a back brace without relief of his pain. The pain is worse with sitting and ambulating and he reports difficulty ambulating secondary to the pain. Pt associates nausea secondary to pain but no vomiting or abdominal pain. He has a h/o of spinal compression fractures and 2 spinal surgeries with the most recent surgery being 3 years ago at the [facility] hospital in [place]. He is followed by Jannifer Rodney, MD who he was unable to make an appointment with today, prompting ED evaluation. Pt takes   oxycodone and  morphine for chronic pain daily. He additionally c/o a burning discomfort to musculature of left chest with worsening of BLE edema. He has a PMhx of DM with hyperglycemia today reading at over 268; pt's normal CBG is 135 and he is unsure of of a reason for hyperglycemia today. Denies bladder or bowel incontinence, fevers, numbness or weakness, SOB, or abdominal pain.   Past Medical History  Diagnosis Date  . Hypertension   . GERD (gastroesophageal reflux disease)   . Osteoporosis   . Migraine   . Chronic lower back pain   . Vitamin D deficiency   . Hepatomegaly   . Pancreatitis   . Diabetes mellitus   . Fracture acetabulum-closed (HCC) 04/09/2013   Past Surgical History  Procedure Laterality Date  . Cholecystectomy    . Hernia repair    . Laminectomy    . Back surgery     Family History  Problem Relation Age of Onset  . Cancer      breast/grandmother, prostate/grandfather   Social History  Substance Use Topics  . Smoking status: Former Smoker    Quit date: 08/26/2007  . Smokeless tobacco: Never Used     Comment: quit smoking in 2009 sometime "  states on electronially cigartettes  . Alcohol Use: No    Review of Systems  Constitutional: Negative for fever.  HENT: Negative for congestion.   Respiratory: Negative for cough and shortness of breath.   Cardiovascular: Positive for chest pain and leg swelling.  Gastrointestinal: Positive for nausea.  Negative for vomiting, abdominal pain and bowel incontinence.  Genitourinary: Negative for bladder incontinence.  Musculoskeletal: Positive for back pain and gait problem ( secondary to pain).  Neurological: Negative for weakness, numbness and paresthesias.  All other systems reviewed and are negative.  Allergies  Ketorolac tromethamine  Home Medications   Prior to Admission medications   Medication Sig Start Date End Date Taking? Authorizing Provider  albuterol (PROVENTIL HFA;VENTOLIN HFA) 108 (90  BASE) MCG/ACT inhaler Inhale 1 puff into the lungs every 6 (six) hours as needed for wheezing or shortness of breath.    Historical Provider, MD  baclofen (LIORESAL) 10 MG tablet Take 10 mg by mouth 3 (three) times daily.    Historical Provider, MD  Calcium Carb-Cholecalciferol (CALCIUM-VITAMIN D) 600-400 MG-UNIT TABS Take 1 tablet by mouth daily.    Historical Provider, MD  clarithromycin (BIAXIN) 500 MG tablet Take 1 tablet (500 mg total) by mouth 2 (two) times daily. 02/02/15   Mechele ClaudeWarren Stacks, MD  cyanocobalamin 1000 MCG tablet Take 100 mcg by mouth daily.    Historical Provider, MD  doxycycline (VIBRA-TABS) 100 MG tablet Take 1 tablet (100 mg total) by mouth 2 (two) times daily. 02/16/15   Junie Spencerhristy A Hawks, FNP  ergocalciferol (VITAMIN D2) 50000 UNITS capsule Take 50,000 Units by mouth once a week. On Saturday    Historical Provider, MD  fluticasone (VERAMYST) 27.5 MCG/SPRAY nasal spray Place 2 sprays into the nose daily.    Historical Provider, MD  gabapentin (NEURONTIN) 400 MG capsule Take 1 capsule (400 mg total) by mouth 3 (three) times daily. Patient taking differently: Take 400 mg by mouth 3 (three) times daily. Patient says he takes three 400 mg  Pills three times daily 01/25/15   Zannie CovePreetha Joseph, MD  insulin aspart (NOVOLOG FLEXPEN) 100 UNIT/ML FlexPen 5-7 units depending on carb intake 03/09/15   Junie Spencerhristy A Hawks, FNP  Insulin Glargine (LANTUS SOLOSTAR) 100 UNIT/ML Solostar Pen Inject 37 Units into the skin daily. 03/09/15   Junie Spencerhristy A Hawks, FNP  morphine (MS CONTIN) 15 MG 12 hr tablet Take 15 mg by mouth 3 (three) times daily.    Historical Provider, MD  oxyCODONE (OXY IR/ROXICODONE) 5 MG immediate release tablet Take 1 tablet (5 mg total) by mouth every 4 (four) hours as needed. 01/25/15   Zannie CovePreetha Joseph, MD  pantoprazole (PROTONIX) 20 MG tablet Take 20 mg by mouth daily.    Historical Provider, MD  ramipril (ALTACE) 5 MG capsule Take 5 mg by mouth daily.    Historical Provider, MD  sertraline  (ZOLOFT) 100 MG tablet Take 100 mg by mouth daily.     Historical Provider, MD   BP 139/90 mmHg  Pulse 93  Temp(Src) 98.1 F (36.7 C) (Oral)  Resp 16  SpO2 95% Physical Exam  Constitutional: He is oriented to person, place, and time. No distress.  Appears very uncomfortable  HENT:  Head: Normocephalic.  Eyes: EOM are normal.  Neck: Neck supple.  Cardiovascular: Normal rate.   Pulmonary/Chest: Effort normal. He has no wheezes. He has no rales.  Musculoskeletal:       Lumbar back: He exhibits decreased range of motion, tenderness and pain.       Back:  Lower extremity edema  Neurological: He is alert and oriented to person, place, and time. No cranial nerve deficit.  Skin: Skin is warm and dry.  Psychiatric: He has a normal mood and affect.  Nursing note and vitals reviewed.   ED Course  Procedures  DIAGNOSTIC  STUDIES: Oxygen Saturation is 95% on RA, adequate by my interpretation.    COORDINATION OF CARE: 3:40 PM Discussed treatment plan with pt at bedside and pt agreed to plan. Medical screening exam complete; EKG and Xray of lumbar spine ordered as radiology requested plain films before CT. IV Dilaudid  and Zofran  ordered for pain management.   Labs Review Labs Reviewed  CBG MONITORING, ED - Abnormal; Notable for the following:    Glucose-Capillary 268 (*)    All other components within normal limits    MDM   I personally performed the services described in this documentation, which was scribed in my presence. The recorded information has been reviewed and is accurate.    [address] [place], NP 06/21/15 1607  Rolland Porter, MD 06/21/15 [phone]

## 2015-06-21 NOTE — ED Notes (Signed)
Pt reports lower back pain for about a week. Pt reports hx of two back surgeries. Pt denies any recent injury to back. Pt states he woke up last Thursday with back pain and pain has gradually worsened over the past week.

## 2015-06-21 NOTE — Discharge Instructions (Signed)
Supine, bed rest, minimizing your upright activity until your symptoms improve.  Follow-up with your neurosurgeon, or the neurosurgeon above in 1-2 weeks if your symptoms are not improving.  Steroids prescribed above will cause increase in your sugars potentially up to 100 more than usual. You will have to supplement your insulin dosages as discussed

## 2015-06-21 NOTE — ED Notes (Signed)
Pt's spouse Nonie HoyerRachel Maltese [phone] asks to be kept updated.

## 2015-06-22 ENCOUNTER — Institutional Professional Consult (permissible substitution): Payer: BLUE CROSS/BLUE SHIELD | Admitting: Internal Medicine

## 2015-07-05 DIAGNOSIS — Z6825 Body mass index (BMI) 25.0-25.9, adult: Secondary | ICD-10-CM | POA: Diagnosis not present

## 2015-07-05 DIAGNOSIS — M5146 Schmorl's nodes, lumbar region: Secondary | ICD-10-CM | POA: Diagnosis not present

## 2015-07-07 DIAGNOSIS — M5146 Schmorl's nodes, lumbar region: Secondary | ICD-10-CM | POA: Diagnosis not present

## 2015-07-10 DIAGNOSIS — S32000A Wedge compression fracture of unspecified lumbar vertebra, initial encounter for closed fracture: Secondary | ICD-10-CM | POA: Diagnosis not present

## 2015-07-11 ENCOUNTER — Other Ambulatory Visit: Payer: Self-pay | Admitting: Neurosurgery

## 2015-07-13 ENCOUNTER — Institutional Professional Consult (permissible substitution): Payer: BLUE CROSS/BLUE SHIELD | Admitting: Internal Medicine

## 2015-07-13 ENCOUNTER — Encounter (HOSPITAL_COMMUNITY): Payer: Self-pay | Admitting: *Deleted

## 2015-07-13 NOTE — Progress Notes (Signed)
Pt SDW-pre-op call completed by pt and pt spouse [name] with pt consent. Pt denies SOB, chest pain, and being under the care of a cardiologist. Spouse denies pt having a stress test, echo and cardiac cath. Spouse made aware to stop administering Aspirin,otc vitamins and herbal medications. Do not take any NSAIDs ie: Ibuprofen, Advil, Naproxen or any medication containing Aspirin. Spoke with [name], Surgical Scheduler, for Dr. [name],  regarding pre-op insulin instructions; no instructions were provided to pt [facility]. Spoke with pt spouse and spouse stated that husband is good at adjusting insulin according to PO intake. Spouse stated that pt fasting blood sugar is usually around 70 and can be as high as 140 if he eats in the middle of the night.

## 2015-07-16 ENCOUNTER — Ambulatory Visit (HOSPITAL_COMMUNITY): Payer: BLUE CROSS/BLUE SHIELD

## 2015-07-16 ENCOUNTER — Ambulatory Visit (HOSPITAL_COMMUNITY): Payer: BLUE CROSS/BLUE SHIELD | Admitting: Anesthesiology

## 2015-07-16 ENCOUNTER — Encounter (HOSPITAL_COMMUNITY)
Admission: RE | Disposition: A | Discharge status: Home / Self Care | Payer: Self-pay | Source: Ambulatory Visit | Attending: Neurosurgery

## 2015-07-16 ENCOUNTER — Encounter (HOSPITAL_COMMUNITY): Payer: Self-pay | Admitting: *Deleted

## 2015-07-16 ENCOUNTER — Ambulatory Visit (HOSPITAL_COMMUNITY)
Admission: RE | Admit: 2015-07-16 | Discharge: 2015-07-16 | Disposition: A | Discharge status: Home / Self Care | Payer: BLUE CROSS/BLUE SHIELD | Source: Ambulatory Visit | Attending: Neurosurgery | Admitting: Neurosurgery

## 2015-07-16 DIAGNOSIS — K219 Gastro-esophageal reflux disease without esophagitis: Secondary | ICD-10-CM | POA: Diagnosis not present

## 2015-07-16 DIAGNOSIS — F4310 Post-traumatic stress disorder, unspecified: Secondary | ICD-10-CM | POA: Insufficient documentation

## 2015-07-16 DIAGNOSIS — E559 Vitamin D deficiency, unspecified: Secondary | ICD-10-CM | POA: Insufficient documentation

## 2015-07-16 DIAGNOSIS — R160 Hepatomegaly, not elsewhere classified: Secondary | ICD-10-CM | POA: Diagnosis not present

## 2015-07-16 DIAGNOSIS — E119 Type 2 diabetes mellitus without complications: Secondary | ICD-10-CM | POA: Insufficient documentation

## 2015-07-16 DIAGNOSIS — Z888 Allergy status to other drugs, medicaments and biological substances status: Secondary | ICD-10-CM | POA: Diagnosis not present

## 2015-07-16 DIAGNOSIS — Z794 Long term (current) use of insulin: Secondary | ICD-10-CM | POA: Insufficient documentation

## 2015-07-16 DIAGNOSIS — G43909 Migraine, unspecified, not intractable, without status migrainosus: Secondary | ICD-10-CM | POA: Insufficient documentation

## 2015-07-16 DIAGNOSIS — M4856XA Collapsed vertebra, not elsewhere classified, lumbar region, initial encounter for fracture: Secondary | ICD-10-CM | POA: Diagnosis not present

## 2015-07-16 DIAGNOSIS — S32030G Wedge compression fracture of third lumbar vertebra, subsequent encounter for fracture with delayed healing: Secondary | ICD-10-CM

## 2015-07-16 DIAGNOSIS — Z87891 Personal history of nicotine dependence: Secondary | ICD-10-CM | POA: Insufficient documentation

## 2015-07-16 DIAGNOSIS — M545 Low back pain: Secondary | ICD-10-CM | POA: Diagnosis not present

## 2015-07-16 DIAGNOSIS — I10 Essential (primary) hypertension: Secondary | ICD-10-CM | POA: Diagnosis not present

## 2015-07-16 DIAGNOSIS — M810 Age-related osteoporosis without current pathological fracture: Secondary | ICD-10-CM | POA: Diagnosis not present

## 2015-07-16 DIAGNOSIS — G4730 Sleep apnea, unspecified: Secondary | ICD-10-CM | POA: Diagnosis not present

## 2015-07-16 DIAGNOSIS — Z419 Encounter for procedure for purposes other than remedying health state, unspecified: Secondary | ICD-10-CM

## 2015-07-16 DIAGNOSIS — S32000A Wedge compression fracture of unspecified lumbar vertebra, initial encounter for closed fracture: Secondary | ICD-10-CM | POA: Diagnosis present

## 2015-07-16 DIAGNOSIS — S32030A Wedge compression fracture of third lumbar vertebra, initial encounter for closed fracture: Secondary | ICD-10-CM | POA: Diagnosis not present

## 2015-07-16 HISTORY — PX: KYPHOPLASTY: SHX5884

## 2015-07-16 HISTORY — DX: Personal history of other infectious and parasitic diseases: Z86.19

## 2015-07-16 HISTORY — DX: Reserved for concepts with insufficient information to code with codable children: IMO0002

## 2015-07-16 HISTORY — DX: Post-traumatic stress disorder, unspecified: F43.10

## 2015-07-16 LAB — CBC
HEMATOCRIT: 43.8 % (ref 39.0–52.0)
Hemoglobin: 14.6 g/dL (ref 13.0–17.0)
MCH: 32.9 pg (ref 26.0–34.0)
MCHC: 33.3 g/dL (ref 30.0–36.0)
MCV: 98.6 fL (ref 78.0–100.0)
PLATELETS: 175 10*3/uL (ref 150–400)
RBC: 4.44 MIL/uL (ref 4.22–5.81)
RDW: 13.6 % (ref 11.5–15.5)
WBC: 7.5 10*3/uL (ref 4.0–10.5)

## 2015-07-16 LAB — SURGICAL PCR SCREEN
MRSA, PCR: NEGATIVE
STAPHYLOCOCCUS AUREUS: NEGATIVE

## 2015-07-16 LAB — GLUCOSE, CAPILLARY
GLUCOSE-CAPILLARY: 161 mg/dL — AB (ref 65–99)
Glucose-Capillary: 169 mg/dL — ABNORMAL HIGH (ref 65–99)
Glucose-Capillary: 149 mg/dL — ABNORMAL HIGH (ref 65–99)
Glucose-Capillary: 161 mg/dL — ABNORMAL HIGH (ref 65–99)

## 2015-07-16 LAB — BASIC METABOLIC PANEL
Anion gap: 13 (ref 5–15)
BUN: 9 mg/dL (ref 6–20)
CALCIUM: 9 mg/dL (ref 8.9–10.3)
CO2: 25 mmol/L (ref 22–32)
CREATININE: 0.8 mg/dL (ref 0.61–1.24)
Chloride: 96 mmol/L — ABNORMAL LOW (ref 101–111)
GFR calc Af Amer: >60 mL/min (ref >60)
GLUCOSE: 167 mg/dL — AB (ref 65–99)
Potassium: 3.5 mmol/L (ref 3.5–5.1)
SODIUM: 134 mmol/L — AB (ref 135–145)

## 2015-07-16 SURGERY — KYPHOPLASTY
Anesthesia: General | Site: Back

## 2015-07-16 MED ORDER — FENTANYL CITRATE (PF) 250 MCG/5ML IJ SOLN
INTRAMUSCULAR | Status: AC
  Filled 2015-07-16: qty 5

## 2015-07-16 MED ORDER — INSULIN ASPART 100 UNIT/ML ~~LOC~~ SOLN
0.0000 [IU] | Freq: Three times a day (TID) | SUBCUTANEOUS | Status: DC
  Administered 2015-07-16: 2 [IU] via SUBCUTANEOUS

## 2015-07-16 MED ORDER — ROCURONIUM BROMIDE 100 MG/10ML IV SOLN
INTRAVENOUS | Status: DC | PRN
  Administered 2015-07-16: 40 mg via INTRAVENOUS

## 2015-07-16 MED ORDER — FLUTICASONE FUROATE 27.5 MCG/SPRAY NA SUSP: 2.0000 | Freq: Every day | NASAL | Status: DC

## 2015-07-16 MED ORDER — NEOSTIGMINE METHYLSULFATE 10 MG/10ML IV SOLN
INTRAVENOUS | Status: DC | PRN
Start: 2015-07-16 — End: 2015-07-16
  Administered 2015-07-16: 4 mg via INTRAVENOUS

## 2015-07-16 MED ORDER — GABAPENTIN 400 MG PO CAPS
1200.0000 mg | ORAL_CAPSULE | Freq: Three times a day (TID) | ORAL | Status: DC
  Administered 2015-07-16: 1200 mg via ORAL
  Filled 2015-07-16: qty 3

## 2015-07-16 MED ORDER — BACLOFEN 10 MG PO TABS
10.0000 mg | ORAL_TABLET | Freq: Three times a day (TID) | ORAL | Status: DC | PRN
  Filled 2015-07-16: qty 1

## 2015-07-16 MED ORDER — LIDOCAINE HCL (CARDIAC) 20 MG/ML IV SOLN
INTRAVENOUS | Status: AC
  Filled 2015-07-16: qty 5

## 2015-07-16 MED ORDER — OXYCODONE HCL 5 MG PO TABS
5.0000 mg | ORAL_TABLET | ORAL | Status: DC | PRN
  Administered 2015-07-16: 5 mg via ORAL
  Filled 2015-07-16: qty 1

## 2015-07-16 MED ORDER — FENTANYL CITRATE (PF) 100 MCG/2ML IJ SOLN
INTRAMUSCULAR | Status: AC
  Filled 2015-07-16: qty 2

## 2015-07-16 MED ORDER — MUPIROCIN 2 % EX OINT
1.0000 "application " | TOPICAL_OINTMENT | Freq: Once | CUTANEOUS | Status: AC
  Administered 2015-07-16: 1 via TOPICAL

## 2015-07-16 MED ORDER — ONDANSETRON HCL 4 MG/2ML IJ SOLN
INTRAMUSCULAR | Status: AC
  Filled 2015-07-16: qty 2

## 2015-07-16 MED ORDER — LIDOCAINE-EPINEPHRINE 0.5 %-1:200000 IJ SOLN
INTRAMUSCULAR | Status: DC | PRN
  Administered 2015-07-16: 6 mL

## 2015-07-16 MED ORDER — GLYCOPYRROLATE 0.2 MG/ML IJ SOLN
INTRAMUSCULAR | Status: DC | PRN
  Administered 2015-07-16: .8 mg via INTRAVENOUS

## 2015-07-16 MED ORDER — SODIUM CHLORIDE 0.9 % IJ SOLN
INTRAMUSCULAR | Status: AC
  Filled 2015-07-16: qty 10

## 2015-07-16 MED ORDER — IOHEXOL 300 MG/ML  SOLN
INTRAMUSCULAR | Status: DC | PRN
  Administered 2015-07-16: 50 mL

## 2015-07-16 MED ORDER — CALCIUM CARBONATE-VITAMIN D 500-200 MG-UNIT PO TABS: 1.0000 | ORAL_TABLET | Freq: Every day | ORAL | Status: DC

## 2015-07-16 MED ORDER — CEFAZOLIN SODIUM-DEXTROSE 2-3 GM-% IV SOLR
2.0000 g | INTRAVENOUS | Status: AC
  Administered 2015-07-16: 2 g via INTRAVENOUS

## 2015-07-16 MED ORDER — MIDAZOLAM HCL 2 MG/2ML IJ SOLN
INTRAMUSCULAR | Status: AC
  Filled 2015-07-16: qty 2

## 2015-07-16 MED ORDER — MORPHINE SULFATE ER 15 MG PO TBCR
15.0000 mg | EXTENDED_RELEASE_TABLET | Freq: Three times a day (TID) | ORAL | Status: DC
  Administered 2015-07-16: 15 mg via ORAL
  Filled 2015-07-16: qty 1

## 2015-07-16 MED ORDER — VITAMIN D (ERGOCALCIFEROL) 1.25 MG (50000 UNIT) PO CAPS: 50000.0000 [IU] | ORAL_CAPSULE | ORAL | Status: DC

## 2015-07-16 MED ORDER — ROCURONIUM BROMIDE 50 MG/5ML IV SOLN
INTRAVENOUS | Status: AC
  Filled 2015-07-16: qty 1

## 2015-07-16 MED ORDER — CALCIUM-VITAMIN D 600-400 MG-UNIT PO TABS: 1.0000 | ORAL_TABLET | Freq: Every day | ORAL | Status: DC

## 2015-07-16 MED ORDER — ONDANSETRON HCL 4 MG/2ML IJ SOLN: 4.0000 mg | Freq: Once | INTRAMUSCULAR | Status: DC | PRN

## 2015-07-16 MED ORDER — LIDOCAINE HCL (CARDIAC) 20 MG/ML IV SOLN
INTRAVENOUS | Status: DC | PRN
  Administered 2015-07-16: 100 mg via INTRAVENOUS

## 2015-07-16 MED ORDER — 0.9 % SODIUM CHLORIDE (POUR BTL) OPTIME
TOPICAL | Status: DC | PRN
  Administered 2015-07-16: 1000 mL

## 2015-07-16 MED ORDER — SUCCINYLCHOLINE CHLORIDE 20 MG/ML IJ SOLN
INTRAMUSCULAR | Status: AC
  Filled 2015-07-16: qty 1

## 2015-07-16 MED ORDER — EPHEDRINE SULFATE 50 MG/ML IJ SOLN
INTRAMUSCULAR | Status: AC
  Filled 2015-07-16: qty 1

## 2015-07-16 MED ORDER — SERTRALINE HCL 50 MG PO TABS
100.0000 mg | ORAL_TABLET | Freq: Every day | ORAL | Status: DC
  Administered 2015-07-16: 100 mg via ORAL
  Filled 2015-07-16: qty 2

## 2015-07-16 MED ORDER — FENTANYL CITRATE (PF) 100 MCG/2ML IJ SOLN
INTRAMUSCULAR | Status: DC | PRN
  Administered 2015-07-16: 50 ug via INTRAVENOUS

## 2015-07-16 MED ORDER — ONDANSETRON HCL 4 MG/2ML IJ SOLN
INTRAMUSCULAR | Status: DC | PRN
Start: 2015-07-16 — End: 2015-07-16
  Administered 2015-07-16: 4 mg via INTRAVENOUS

## 2015-07-16 MED ORDER — PHENYLEPHRINE HCL 10 MG/ML IJ SOLN
INTRAMUSCULAR | Status: DC | PRN
  Administered 2015-07-16 (×2): 120 ug via INTRAVENOUS
  Administered 2015-07-16 (×2): 80 ug via INTRAVENOUS

## 2015-07-16 MED ORDER — VITAMIN B-12 100 MCG PO TABS
100.0000 ug | ORAL_TABLET | Freq: Every day | ORAL | Status: DC
  Administered 2015-07-16: 100 ug via ORAL
  Filled 2015-07-16: qty 1

## 2015-07-16 MED ORDER — PROPOFOL 10 MG/ML IV BOLUS
INTRAVENOUS | Status: AC
  Filled 2015-07-16: qty 20

## 2015-07-16 MED ORDER — FLUTICASONE PROPIONATE 50 MCG/ACT NA SUSP
2.0000 | Freq: Every day | NASAL | Status: DC
  Filled 2015-07-16: qty 16

## 2015-07-16 MED ORDER — MUPIROCIN 2 % EX OINT
TOPICAL_OINTMENT | CUTANEOUS | Status: AC
  Filled 2015-07-16: qty 22

## 2015-07-16 MED ORDER — PANTOPRAZOLE SODIUM 20 MG PO TBEC
20.0000 mg | DELAYED_RELEASE_TABLET | Freq: Two times a day (BID) | ORAL | Status: DC
  Administered 2015-07-16: 20 mg via ORAL
  Filled 2015-07-16 (×2): qty 1

## 2015-07-16 MED ORDER — INSULIN GLARGINE 100 UNIT/ML ~~LOC~~ SOLN
37.0000 [IU] | Freq: Every day | SUBCUTANEOUS | Status: DC
  Administered 2015-07-16: 37 [IU] via SUBCUTANEOUS
  Filled 2015-07-16: qty 0.37

## 2015-07-16 MED ORDER — ALBUTEROL SULFATE (2.5 MG/3ML) 0.083% IN NEBU: 2.5000 mg | INHALATION_SOLUTION | Freq: Four times a day (QID) | RESPIRATORY_TRACT | Status: DC | PRN

## 2015-07-16 MED ORDER — PHENYLEPHRINE 40 MCG/ML (10ML) SYRINGE FOR IV PUSH (FOR BLOOD PRESSURE SUPPORT)
PREFILLED_SYRINGE | INTRAVENOUS | Status: AC
  Filled 2015-07-16: qty 10

## 2015-07-16 MED ORDER — INSULIN ASPART 100 UNIT/ML FLEXPEN: 5.0000 [IU] | PEN_INJECTOR | SUBCUTANEOUS | Status: DC

## 2015-07-16 MED ORDER — CEFAZOLIN SODIUM-DEXTROSE 2-3 GM-% IV SOLR
INTRAVENOUS | Status: AC
  Filled 2015-07-16: qty 50

## 2015-07-16 MED ORDER — LACTATED RINGERS IV SOLN
INTRAVENOUS | Status: DC | PRN
  Administered 2015-07-16 (×2): via INTRAVENOUS

## 2015-07-16 MED ORDER — POTASSIUM CHLORIDE IN NACL 20-0.9 MEQ/L-% IV SOLN
INTRAVENOUS | Status: DC
  Filled 2015-07-16 (×2): qty 1000

## 2015-07-16 MED ORDER — FENTANYL CITRATE (PF) 100 MCG/2ML IJ SOLN: 25.0000 ug | INTRAMUSCULAR | Status: DC | PRN

## 2015-07-16 MED ORDER — PROPOFOL 10 MG/ML IV BOLUS
INTRAVENOUS | Status: DC | PRN
  Administered 2015-07-16: 160 mg via INTRAVENOUS

## 2015-07-16 SURGICAL SUPPLY — 39 items
BLADE CLIPPER SURG (BLADE) IMPLANT
BLADE SURG 15 STRL LF DISP TIS (BLADE) ×1 IMPLANT
BLADE SURG 15 STRL SS (BLADE) ×3
CEMENT BONE KYPHX HV R (Orthopedic Implant) ×2 IMPLANT
DRAPE C-ARM 42X72 X-RAY (DRAPES) ×3 IMPLANT
DRAPE INCISE IOBAN 66X45 STRL (DRAPES) ×3 IMPLANT
DRAPE LAPAROTOMY 100X72X124 (DRAPES) ×3 IMPLANT
DRAPE PROXIMA HALF (DRAPES) ×3 IMPLANT
DRAPE SURG 17X23 STRL (DRAPES) ×3 IMPLANT
DURAPREP 26ML APPLICATOR (WOUND CARE) ×3 IMPLANT
GAUZE SPONGE 4X4 16PLY XRAY LF (GAUZE/BANDAGES/DRESSINGS) ×3 IMPLANT
GLOVE BIOGEL PI IND STRL 8 (GLOVE) ×2 IMPLANT
GLOVE BIOGEL PI INDICATOR 8 (GLOVE) ×4
GLOVE ECLIPSE 6.5 STRL STRAW (GLOVE) ×3 IMPLANT
GLOVE ECLIPSE 7.5 STRL STRAW (GLOVE) ×4 IMPLANT
GLOVE EXAM NITRILE LRG STRL (GLOVE) IMPLANT
GLOVE EXAM NITRILE MD LF STRL (GLOVE) IMPLANT
GLOVE EXAM NITRILE XL STR (GLOVE) IMPLANT
GLOVE EXAM NITRILE XS STR PU (GLOVE) IMPLANT
GOWN STRL REUS W/ TWL LRG LVL3 (GOWN DISPOSABLE) ×2 IMPLANT
GOWN STRL REUS W/ TWL XL LVL3 (GOWN DISPOSABLE) IMPLANT
GOWN STRL REUS W/TWL 2XL LVL3 (GOWN DISPOSABLE) IMPLANT
GOWN STRL REUS W/TWL LRG LVL3 (GOWN DISPOSABLE) ×6
GOWN STRL REUS W/TWL XL LVL3 (GOWN DISPOSABLE)
KIT BASIN OR (CUSTOM PROCEDURE TRAY) ×3 IMPLANT
KIT POSITION SURG JACKSON T1 (MISCELLANEOUS) ×2 IMPLANT
KIT ROOM TURNOVER OR (KITS) ×3 IMPLANT
LIQUID BAND (GAUZE/BANDAGES/DRESSINGS) ×3 IMPLANT
NEEDLE HYPO 25X1 1.5 SAFETY (NEEDLE) ×3 IMPLANT
NS IRRIG 1000ML POUR BTL (IV SOLUTION) ×3 IMPLANT
PACK SURGICAL SETUP 50X90 (CUSTOM PROCEDURE TRAY) ×3 IMPLANT
PAD ARMBOARD 7.5X6 YLW CONV (MISCELLANEOUS) ×9 IMPLANT
SPECIMEN JAR SMALL (MISCELLANEOUS) IMPLANT
STAPLER SKIN PROX WIDE 3.9 (STAPLE) ×1 IMPLANT
SUT VIC AB 3-0 SH 8-18 (SUTURE) ×3 IMPLANT
SYR CONTROL 10ML LL (SYRINGE) ×4 IMPLANT
TOWEL OR 17X24 6PK STRL BLUE (TOWEL DISPOSABLE) ×3 IMPLANT
TOWEL OR 17X26 10 PK STRL BLUE (TOWEL DISPOSABLE) ×3 IMPLANT
TRAY KYPHOPAK 15/3 ONESTEP 1ST (MISCELLANEOUS) ×2 IMPLANT

## 2015-07-16 NOTE — Transfer of Care (Signed)
Immediate Anesthesia Transfer of Care Note  Patient: [name]  Procedure(s) Performed: Procedure(s) with comments: Lumbar three kyphoplasty (N/A) - Lumbar three kyphoplasty  Patient Location: PACU  Anesthesia Type:General  Level of Consciousness: awake and patient cooperative  Airway & Oxygen Therapy: Patient Spontanous Breathing and Patient connected to nasal cannula oxygen  Post-op Assessment: Report given to RN, Post -op Vital signs reviewed and stable and Patient moving all extremities X 4  Post vital signs: Reviewed and stable  Last Vitals:  Filed Vitals:   07/16/15 0647  BP: 136/80  Temp: 37.1 C  Resp: 18    Complications: No apparent anesthesia complications

## 2015-07-16 NOTE — Progress Notes (Signed)
Patient alert and oriented, mae's well, voiding adequate amount of urine, swallowing without difficulty, c/o pain and MD aware. Patient discharged home with family and has pain medication at home. Discharged instructions given to patient. Patient and family stated understanding of d/c instructions given and has an appointment with MD.

## 2015-07-16 NOTE — Anesthesia Postprocedure Evaluation (Signed)
Anesthesia Post Note  Patient: [name]  Procedure(s) Performed: Procedure(s) (LRB): Lumbar three kyphoplasty (N/A)  Patient location during evaluation: PACU Anesthesia Type: General Level of consciousness: awake and alert Pain management: pain level controlled Vital Signs Assessment: post-procedure vital signs reviewed and stable Respiratory status: spontaneous breathing, nonlabored ventilation, respiratory function stable and patient connected to nasal cannula oxygen Cardiovascular status: blood pressure returned to baseline and stable Postop Assessment: No signs of nausea or vomiting Anesthetic complications: no    Last Vitals:  Filed Vitals:   07/16/15 1045 07/16/15 1100  BP:    Pulse: 88 89  Temp:    Resp: 65 49    Last Pain:  Filed Vitals:   07/16/15 1105  PainSc: Asleep    LLE Motor Response: Purposeful movement LLE Sensation: Full sensation, No numbness RLE Motor Response: Purposeful movement RLE Sensation: Full sensation, No numbness      Lynnann Knudsen JENNETTE

## 2015-07-16 NOTE — Anesthesia Preprocedure Evaluation (Addendum)
Anesthesia Evaluation  Patient identified by MRN, date of birth, ID band Patient awake    Reviewed: Allergy & Precautions, NPO status , Patient's Chart, lab work & pertinent test results  History of Anesthesia Complications Negative for: history of anesthetic complications  Airway Mallampati: II  TM Distance: >3 FB Neck ROM: Full    Dental no notable dental hx. (+) Dental Advisory Given, Poor Dentition   Pulmonary sleep apnea , former smoker,  Hx of legionella PNA s/p intubation and ICU stay, wife reports he is now asymptomatic and has recovered well   Pulmonary exam normal breath sounds clear to auscultation       Cardiovascular hypertension, Pt. on medications Normal cardiovascular exam Rhythm:Regular Rate:Normal     Neuro/Psych  Headaches, PSYCHIATRIC DISORDERS (PTSD) negative psych ROS   GI/Hepatic Neg liver ROS, GERD  Medicated and Controlled,  Endo/Other  diabetes, Type 2, Insulin Dependent  Renal/GU negative Renal ROS  negative genitourinary   Musculoskeletal negative musculoskeletal ROS (+)   Abdominal   Peds negative pediatric ROS (+)  Hematology negative hematology ROS (+)   Anesthesia Other Findings   Reproductive/Obstetrics negative OB ROS                           Anesthesia Physical Anesthesia Plan  ASA: III  Anesthesia Plan: General   Post-op Pain Management:    Induction: Intravenous  Airway Management Planned: Oral ETT  Additional Equipment:   Intra-op Plan:   Post-operative Plan: Extubation in OR  Informed Consent: I have reviewed the patients History and Physical, chart, labs and discussed the procedure including the risks, benefits and alternatives for the proposed anesthesia with the patient or authorized representative who has indicated his/her understanding and acceptance.   Dental advisory given  Plan Discussed with: CRNA, Anesthesiologist and  Surgeon  Anesthesia Plan Comments:        Anesthesia Quick Evaluation

## 2015-07-16 NOTE — Discharge Instructions (Signed)
Care After ?These instructions give you information on caring for yourself after your procedure. Your doctor may also give you more specific instructions. Call your doctor if you have any problems or questions after your procedure. ?HOME CARE ?Take medicine as told by your doctor. ?Keep your wound covered for 24 hours or as told by your doctor. ?Ask your doctor when you can bathe or shower. ?Put ice on your wound if it helps your pain. ?Put ice in a plastic bag. ?Place a towel between your skin and the bag. ?Leave the ice on for 15 to 20 minutes, 3 to 4 times a day. ? ?Return to normal activities as told by your doctor. ?Ask your doctor what stretches and exercises you can do. ?Do not bend or lift anything heavy as told by your doctor. ?GET HELP RIGHT AWAY IF:  ?You have bad back pain that comes on suddenly. ?You cannot control when you pee (urinate) or poop (bowel movement). ?You lose feeling (numbness) in your legs or feet, or they become weak. ?You have shooting pain down your legs. ?You have a fever. ?Your wound becomes red, puffy (swollen), or tender to the touch. ?You are bleeding or leaking fluid from the wound. ?You are sick to your stomach (nauseous) or throw up (vomit) for more than 24 hours after the procedure. ?Your back pain does not get better. ?MAKE SURE YOU: ?Understand these instructions. ?Will watch your condition. ?Will get help right away if you are not doing well or get worse. ?Document Released: 11/05/2009 Document Revised: 11/03/2011 Document Reviewed: 01/15/2011 ?ExitCare? Patient Information ?2013 [employer].  ?

## 2015-07-16 NOTE — Op Note (Signed)
07/16/2015  9:56 AM  PATIENT:  [name]  54 y.o. male with an acute compression fracture at L3. He has opted for kyphoplasty in the hope that it will decrease his pain.   PRE-OPERATIVE DIAGNOSIS:  Lumbar three compression fracture  POST-OPERATIVE DIAGNOSIS:  Lumbar three compression fracture  PROCEDURE:  Procedure(s): Lumbar three kyphoplasty  SURGEON:  Surgeon(s): Coletta MemosKyle Dyani Babel, MD  ANESTHESIA:   general  EBL:     BLOOD ADMINISTERED:none  COUNT:per nursing  SPECIMEN:  No Specimen  DICTATION: [name] was taken to the operating room, intubated and placed under a general anesthetic without difficulty. He was positioned prone on the operating room table with all pressure points properly padded. Her back was prepped and draped in a sterile manner. With fluoroscopy I localized the L3 pedicles bilaterally. I injected lidocaine into the entry sites on both the left and right sides. I started by making a stab incision on the right side and entering the right L3 pedicle with fluoroscopic guidance.  I then did the same on the left side.Once good position was obtained, I drilled into the vertebral body. I then placed the kyphoplasty balloons into the L3 vertebra and inflated the balloon. I then inserted 4.5cc  of methylmethacrylate into the vertebral body on the right and left sides under fluoroscopic guidance. I achieved a fill of the cavity, staying within the confines of the vertebral body. I removed the instrumentation from the vertebral body, and the final films looked good. I closed the stab incision with vicryl suture and used Dermabond for a sterile dressing. Marland Kitchen.    PLAN OF CARE: Admit to inpatient   PATIENT DISPOSITION:  PACU - hemodynamically stable.   Delay start of Pharmacological VTE agent (>24hrs) due to surgical blood loss or risk of bleeding:  yes

## 2015-07-16 NOTE — H&P (Signed)
BP 136/80 mmHg  Temp(Src) 98.8 F (37.1 C) (Oral)  Resp 18  Ht  (1.727 m)  Wt 78.019 kg (172 lb)  BMI 26.16 kg/m2  SpO2 94%  [name] comes in today secondary to pain he has in his lower back and right leg.     HOPI:                                                   He is 54 years of age, currently disabled.  He is right-handed.  The pain started approximately a month ago.  No true antecedent trauma.  He has had back surgery on two occasions, in 1993 and in 2013.  He does feel numbness and tingling in the legs, feet and chest.  He has not had any real relief from this.  He was seen in the emergency room on 06/21/2015 for the pain that he had in his back.  He also underwent a plain spinal film.     [name], when he presented to the emergency room, complained of gradually worsening constant, 8/10, non-radiating, midline lower back pain which started on 10/20.  He had been wearing a brace without relief.  He said he ad difficulty ambulating secondary to his pain.  He was already taking 5 mg of oxycodone and 15 mg of morphine for chronic pain daily.  On his evaluation, he had a history of hypertension, gastroesophageal reflux, osteoporosis, migraine, vitamin D deficiency, hepatomegaly, pancreatitis, a fracture of his acetabulum.  He has had a cholecystectomy, herniorrhaphy.     [name] was told to follow up with the neurosurgeon secondary to the pain that he had in his lower extremities.     DATA:                                                  X-ray at that time simply showed some Schmorl's nodes which had progressed in his lower back.  No MRI was done and he had a significant amount of edema at L2 Schmorl's node.  I told him he had a degenerative disk problems and he had some retrolisthesis at L4-L5, but I did not believe that I could tell him much beyond that since I did not actually have an MRI.     PAST MEDICAL HISTORY:                    Includes diabetes,  hypertension.     ALLERGIES:                                         HE DOES HAVE AN ALLERGY TO TORADOL.     MEDICATIONS:                         He takes methocarbamol, methylprednisolone dose packs, albuterol, calcium, clarithromycin, cyanocobalamin, doxycycline, ergocalciferol, fluticasone, gabapentin, insulin, morphine, oxycodone, sertraline, pantoprazole.     INTERVAL PFSH:  Mother is 8973, is in fair health.  Father is 7474, in fair health.  He does not smoke, he does not use alcohol, does not illicit drugs.     REVIEW OF SYSTEMS:                        Positive for eyeglasses, swelling in his feet, leg pain with walking, leg weakness, back pain, leg pain, joint pain, breast pain, depression, diabetes.  He denies constitutional, ear, nose, throat, mouth, respiratory, gastrointestinal, genitourinary, neurological, hematological, allergic problems.  On his pain chart he lists pain across his chest along with numbness, pain in the right lower extremity, pain in the left leg and foot, pain across the lumbar region completely extending into the buttocks on the right side and posterior elements of the lower extremity.     PHYSICAL EXAMINATION:                    Vital signs are as follows, height 5 feet 9 inches, weight 175 pounds, temperature is 98.5, blood pressure 124/88, pulse is 103, respiratory is 19, pain is 9/10.  On physical exam, alert and oriented x4, answering all questions appropriately.  Memory, language, attention span and fund of knowledge are normal.  Speech is clear and fluent.  He is not in obvious distress.  Normal muscle tone, bulk and coordination.  Reflexes trace at the knees and at the ankles, normal reflex 2+ biceps, triceps, and brachioradialis.  Proprioception intact.  Gait is antalgic.  Pupils equal, round and reactive to light.  Full extraocular movements.  Full visual fields.  Hearing intact voice.  Symmetric facial sensation and movement.   Uvula elevates midline.  Shoulder shrug is normal.  Tongue protrudes in the midline.    He has an acute compression fracture at L3.  That is where he had that abnormality on the plain x-ray.  Schmorl node is there, but that is not the reason for his severe pain.       IMPRESSION/PLAN:                             We will plan on doing a kyphoplasty.  Risks and benefits were explained.  He understands the kyphoplasty and the procedure that is being done.  Risk, including paralysis, bleeding, infection, no pain relief, along with other risks were explained.  He understands and wishes to proceed.

## 2015-07-16 NOTE — Discharge Summary (Signed)
  Physician Discharge Summary  Patient ID: [name] P Rogan MRN: [id number] DOB/AGE: 53/02/1961 53 y.o.  Admit date: 07/16/2015 Discharge date: 07/16/2015  Admission Diagnoses:Compression fracture L3  Discharge Diagnoses:  Active Problems:   Compression fracture of L3 lumbar vertebra with delayed healing   Compression fracture of lumbar vertebra [facility](HCC)   Discharged Condition: good  Hospital Course: [name] was admitted and taken to the operating room for an uncomplicated Kyphoplasty at L3. Post op his neurological exam remained at his baseline. Alert and oriented x 4, moving all extremities well. He continues with back pain which was to be expected. He will be discharged home today.   Treatments: surgery: L3 Kyphoplasty  Discharge Exam: Blood pressure 145/93, pulse 84, temperature 97.6 F (36.4 C), temperature source Oral, resp. rate 20, height 5\' 8"  (1.727 m), weight 78.019 kg (172 lb), SpO2 98 %. General appearance: alert, appears older than stated age and moderate distress Neurologic: Grossly normal  Disposition: 01-Home or Self Care Lumbar three compression fracture    Medication List    TAKE these medications        albuterol 108 (90 BASE) MCG/ACT inhaler  Commonly known as:  PROVENTIL HFA;VENTOLIN HFA  Inhale 1 puff into the lungs every 6 (six) hours as needed for wheezing or shortness of breath.     baclofen 10 MG tablet  Commonly known as:  LIORESAL  Take 10 mg by mouth 3 (three) times daily as needed for muscle spasms.     Calcium-Vitamin D 600-400 MG-UNIT Tabs  Take 1 tablet by mouth daily.     cyanocobalamin 1000 MCG tablet  Take 100 mcg by mouth daily.     ergocalciferol 50000 UNITS capsule  Commonly known as:  VITAMIN D2  Take 50,000 Units by mouth once a week. On Saturday     fluticasone 27.5 MCG/SPRAY nasal spray  Commonly known as:  VERAMYST  Place 2 sprays into the nose daily.     gabapentin 400 MG capsule  Commonly known as:  NEURONTIN   Take 1,200 mg by mouth 3 (three) times daily.     insulin aspart 100 UNIT/ML FlexPen  Commonly known as:  NOVOLOG FLEXPEN  5-7 units depending on carb intake     Insulin Glargine 100 UNIT/ML Solostar Pen  Commonly known as:  LANTUS SOLOSTAR  Inject 37 Units into the skin daily.     morphine 15 MG 12 hr tablet  Commonly known as:  MS CONTIN  Take 15 mg by mouth 3 (three) times daily.     oxyCODONE 5 MG immediate release tablet  Commonly known as:  Oxy IR/ROXICODONE  Take 1 tablet (5 mg total) by mouth every 4 (four) hours as needed.     pantoprazole 20 MG tablet  Commonly known as:  PROTONIX  Take 20 mg by mouth 2 (two) times daily.     sertraline 100 MG tablet  Commonly known as:  ZOLOFT  Take 100 mg by mouth daily.           Follow-up Information    Follow up with Anaston Koehn L, MD In 3 weeks.   Specialty:  Neurosurgery   Why:  call office to make an appointment   Contact information:   [address] [place] [address] [phone]       Signed: Carmela HurtCABBELL,Dorna Mallet L 07/16/2015, 5:37 PM

## 2015-07-16 NOTE — Anesthesia Procedure Notes (Signed)
Procedure Name: Intubation Date/Time: 07/16/2015 8:48 AM Performed by: Rogelia BogaMUELLER, Alysiana Ethridge P Pre-anesthesia Checklist: Patient identified, Emergency Drugs available, Suction available, Patient being monitored and Timeout performed Patient Re-evaluated:Patient Re-evaluated prior to inductionOxygen Delivery Method: Circle system utilized Preoxygenation: Pre-oxygenation with 100% oxygen Intubation Type: IV induction Ventilation: Mask ventilation without difficulty Laryngoscope Size: Mac and 4 Grade View: Grade I Tube type: Oral Tube size: 7.5 mm Number of attempts: 1 Airway Equipment and Method: Stylet Placement Confirmation: ETT inserted through vocal cords under direct vision,  positive ETCO2 and breath sounds checked- equal and bilateral Secured at: 22 cm Tube secured with: Tape Dental Injury: Teeth and Oropharynx as per pre-operative assessment

## 2015-07-17 ENCOUNTER — Encounter (HOSPITAL_COMMUNITY): Payer: Self-pay | Admitting: Neurosurgery

## 2015-07-19 ENCOUNTER — Encounter (HOSPITAL_COMMUNITY): Payer: Self-pay | Admitting: Emergency Medicine

## 2015-07-19 ENCOUNTER — Emergency Department (HOSPITAL_COMMUNITY)
Admission: EM | Admit: 2015-07-19 | Discharge: 2015-07-19 | Disposition: A | Discharge status: Home / Self Care | Payer: BLUE CROSS/BLUE SHIELD | Attending: Emergency Medicine | Admitting: Emergency Medicine

## 2015-07-19 DIAGNOSIS — M549 Dorsalgia, unspecified: Secondary | ICD-10-CM

## 2015-07-19 DIAGNOSIS — Z79899 Other long term (current) drug therapy: Secondary | ICD-10-CM | POA: Diagnosis not present

## 2015-07-19 DIAGNOSIS — Z8619 Personal history of other infectious and parasitic diseases: Secondary | ICD-10-CM | POA: Insufficient documentation

## 2015-07-19 DIAGNOSIS — G8918 Other acute postprocedural pain: Secondary | ICD-10-CM

## 2015-07-19 DIAGNOSIS — Z8781 Personal history of (healed) traumatic fracture: Secondary | ICD-10-CM | POA: Insufficient documentation

## 2015-07-19 DIAGNOSIS — Z87891 Personal history of nicotine dependence: Secondary | ICD-10-CM | POA: Diagnosis not present

## 2015-07-19 DIAGNOSIS — K219 Gastro-esophageal reflux disease without esophagitis: Secondary | ICD-10-CM | POA: Insufficient documentation

## 2015-07-19 DIAGNOSIS — G8928 Other chronic postprocedural pain: Secondary | ICD-10-CM | POA: Insufficient documentation

## 2015-07-19 DIAGNOSIS — Z794 Long term (current) use of insulin: Secondary | ICD-10-CM | POA: Diagnosis not present

## 2015-07-19 DIAGNOSIS — I10 Essential (primary) hypertension: Secondary | ICD-10-CM | POA: Insufficient documentation

## 2015-07-19 DIAGNOSIS — M7981 Nontraumatic hematoma of soft tissue: Secondary | ICD-10-CM | POA: Insufficient documentation

## 2015-07-19 DIAGNOSIS — G43909 Migraine, unspecified, not intractable, without status migrainosus: Secondary | ICD-10-CM | POA: Insufficient documentation

## 2015-07-19 DIAGNOSIS — E119 Type 2 diabetes mellitus without complications: Secondary | ICD-10-CM | POA: Insufficient documentation

## 2015-07-19 DIAGNOSIS — E559 Vitamin D deficiency, unspecified: Secondary | ICD-10-CM | POA: Insufficient documentation

## 2015-07-19 DIAGNOSIS — G8929 Other chronic pain: Secondary | ICD-10-CM

## 2015-07-19 DIAGNOSIS — Z9889 Other specified postprocedural states: Secondary | ICD-10-CM | POA: Insufficient documentation

## 2015-07-19 DIAGNOSIS — M545 Low back pain: Secondary | ICD-10-CM | POA: Diagnosis present

## 2015-07-19 MED ORDER — OXYCODONE-ACETAMINOPHEN 5-325 MG PO TABS
1.0000 | ORAL_TABLET | Freq: Once | ORAL | Status: AC
  Administered 2015-07-19: 1 via ORAL
  Filled 2015-07-19: qty 1

## 2015-07-19 MED ORDER — MORPHINE SULFATE ER 15 MG PO TBCR
15.0000 mg | EXTENDED_RELEASE_TABLET | ORAL | Status: AC
  Administered 2015-07-19: 15 mg via ORAL
  Filled 2015-07-19: qty 1

## 2015-07-19 MED ORDER — OXYCODONE HCL 5 MG PO TABS
5.0000 mg | ORAL_TABLET | ORAL | Status: DC | PRN
Start: 2015-07-19 — End: 2017-10-16

## 2015-07-19 MED ORDER — MORPHINE SULFATE ER 15 MG PO TBCR: 15.0000 mg | EXTENDED_RELEASE_TABLET | Freq: Three times a day (TID) | ORAL | Status: DC

## 2015-07-19 NOTE — ED Notes (Signed)
Pt emerged from room once again asking for a pillow. Told pt we would look for one and instructed pt to return to bed and please use call bed again so pt does not fall.

## 2015-07-19 NOTE — ED Notes (Signed)
Pt in [facility] from home, reports lower back pain. Had surgery on Monday, doctors informed pt to inc # of pills and pt has since ran out of pain meds.

## 2015-07-19 NOTE — ED Notes (Signed)
Pt currently in room screaming and yelling.

## 2015-07-19 NOTE — ED Notes (Signed)
EDP is at bedside. 

## 2015-07-19 NOTE — ED Notes (Signed)
Pt calling out asking what he is waiting for stating "I came in by EMS if you all havent noticed" informed that EDP would be with pt shortly.

## 2015-07-19 NOTE — ED Notes (Signed)
Pt emerged from room asking why we were not in there checking on him. Asked pt to please return to bed b/c did not want pt to fall since high fall risk. Also informed pt again to please use call bell before getting up and that nurses assessment was complete and we are now waiting on EDP to enter room to assess.

## 2015-07-19 NOTE — ED Provider Notes (Signed)
CSN: [id number]     Arrival date & time 07/19/15  [id number] History   First MD Initiated Contact with Patient 07/19/15 0557     Chief Complaint  Patient presents with  . Back Pain     (Consider location/radiation/quality/duration/timing/severity/associated sxs/prior Treatment) HPI   Pt with hx chronic low back pain with L3 kyphoplasty by Dr Franky Machoabbell on 07/16/15 presents with withdrawal symptoms after running out of his pain medications.  States he is sweating, shaky, feels like he is crawling out of his skin.  Took his last doses of pain medications yesterday.  States he was on a [facility] and understood at the beginning of the month that he could double his medications if he needed for pain - he has since learned the doctor did not intend this to be the message.  States his doctor then insisted that patient should be covered with pain medications by his neurosurgeon until he is cleared at his postoperative appointment. Pt denies fevers, N/V/D.  Denies increased pain in his back (states it actually feels much better, the best it has felt in a long time), denies weakness or numbness of the legs.  No discharge or problems with his surgical wounds.   Past Medical History  Diagnosis Date  . Hypertension   . GERD (gastroesophageal reflux disease)   . Osteoporosis   . Migraine   . Chronic lower back pain   . Vitamin D deficiency   . Hepatomegaly   . Pancreatitis   . Diabetes mellitus   . Fracture acetabulum-closed (HCC) 04/09/2013  . Compression fracture     lumbar 3  . H/O Legionnaire's disease   . Sleep apnea     does not wear CPAP  . PTSD (post-traumatic stress disorder)    Past Surgical History  Procedure Laterality Date  . Cholecystectomy    . Hernia repair    . Laminectomy    . Back surgery    . Sternum fracture surgery    . Kyphoplasty N/A 07/16/2015    Procedure: Lumbar three kyphoplasty;  Surgeon: Coletta MemosKyle Cabbell, MD;  Location: [facility];  Service:  Neurosurgery;  Laterality: N/A;  Lumbar three kyphoplasty   Family History  Problem Relation Age of Onset  . Cancer      breast/grandmother, prostate/grandfather   Social History  Substance Use Topics  . Smoking status: Former Smoker    Quit date: 08/26/2007  . Smokeless tobacco: Never Used     Comment: quit smoking in 2009 sometime "  states on electronially cigartettes  . Alcohol Use: No    Review of Systems  Constitutional: Negative for fever.  Respiratory: Negative for shortness of breath.   Cardiovascular: Negative for chest pain.  Gastrointestinal: Negative for nausea, vomiting, abdominal pain and diarrhea.  Musculoskeletal: Negative for back pain and neck stiffness.  Allergic/Immunologic: Negative for immunocompromised state.  Neurological: Negative for weakness and numbness.  Psychiatric/Behavioral: Negative for self-injury.      Allergies  Ketorolac tromethamine  Home Medications   Prior to Admission medications   Medication Sig Start Date End Date Taking? Authorizing Provider  albuterol (PROVENTIL HFA;VENTOLIN HFA) 108 (90 BASE) MCG/ACT inhaler Inhale 1 puff into the lungs every 6 (six) hours as needed for wheezing or shortness of breath.    Historical Provider, MD  baclofen (LIORESAL) 10 MG tablet Take 10 mg by mouth 3 (three) times daily as needed for muscle spasms.     Historical Provider, MD  Calcium  Carb-Cholecalciferol (CALCIUM-VITAMIN D) 600-400 MG-UNIT TABS Take 1 tablet by mouth daily.    Historical Provider, MD  cyanocobalamin 1000 MCG tablet Take 100 mcg by mouth daily.    Historical Provider, MD  ergocalciferol (VITAMIN D2) 50000 UNITS capsule Take 50,000 Units by mouth once a week. On Saturday    Historical Provider, MD  fluticasone (VERAMYST) 27.5 MCG/SPRAY nasal spray Place 2 sprays into the nose daily.    Historical Provider, MD  gabapentin (NEURONTIN) 400 MG capsule Take 1,200 mg by mouth 3 (three) times daily.    Historical Provider, MD  insulin  aspart (NOVOLOG FLEXPEN) 100 UNIT/ML FlexPen 5-7 units depending on carb intake Patient taking differently: Inject 5-7 Units into the skin See admin instructions. 5-7 units depending on carb intake 03/09/15   Junie Spencer, FNP  Insulin Glargine (LANTUS SOLOSTAR) 100 UNIT/ML Solostar Pen Inject 37 Units into the skin daily. 03/09/15   Junie Spencer, FNP  morphine (MS CONTIN) 15 MG 12 hr tablet Take 15 mg by mouth 3 (three) times daily.    Historical Provider, MD  oxyCODONE (OXY IR/ROXICODONE) 5 MG immediate release tablet Take 1 tablet (5 mg total) by mouth every 4 (four) hours as needed. Patient taking differently: Take 5 mg by mouth every 4 (four) hours as needed for moderate pain.  01/25/15   Zannie Cove, MD  pantoprazole (PROTONIX) 20 MG tablet Take 20 mg by mouth 2 (two) times daily.     Historical Provider, MD  sertraline (ZOLOFT) 100 MG tablet Take 100 mg by mouth daily.     Historical Provider, MD   BP 139/87 mmHg  Pulse 96  Temp(Src) 98.7 F (37.1 C) (Oral)  Resp 18  SpO2 95% Physical Exam  Constitutional: He appears well-developed and well-nourished. No distress.  HENT:  Head: Normocephalic and atraumatic.  Neck: Neck supple.  Pulmonary/Chest: Effort normal.  Musculoskeletal:  Lumbar spine with two surgical incisions on each side of the spine.  Very small area of ecchymosis associated with each.  No erythema, edema, warmth, discharge.    Pt walking independently, swinging legs while sitting up on stretcher.  Lower extremities with sensation intact, strength is equal bilaterally.  Neurological: He is alert.  Skin: He is not diaphoretic.  Nursing note and vitals reviewed.   ED Course  Procedures (including critical care time) Labs Review Labs Reviewed - No data to display  Imaging Review No results found. I have personally reviewed and evaluated these images and lab results as part of my medical decision-making.   EKG Interpretation None      MDM   Final  diagnoses:  Postoperative pain  Chronic back pain    Afebrile, nontoxic patient in an unfortunate predicament.  [facility], understands from his PCP that he will not continue in pain management with them until cleared by neurosurgery and should receive medications from neurosurgery.  He did take more than the prescribed amount due to a reported misunderstanding with his PCP.  Pt checked on [place] [employer]A database and he has regular prescriptions of pain medications every month only from the [place].  Today is Thanksgiving and offices are closed.  Pt currently experiencing withdrawal symptoms.  Given dose of his pain medications in ED.   No e/o postoperative complication or infection.  D/C home with short course of his home pain medications to last until early next week when he can sort out continuing Rx through [place] or  neurosurgery.  Discussed result, findings, treatment, and follow up  with patient.  Pt given return precautions.  Pt verbalizes understanding and agrees with plan.         Trixie Dredge, PA-C 07/19/15 [id number]  Gilda Crease, MD 07/19/15 [id number]

## 2015-07-19 NOTE — Discharge Instructions (Signed)
Read the information below.  Use the prescribed medication as directed.  Please discuss all new medications with your pharmacist.  Do not take additional tylenol while taking the prescribed pain medication to avoid overdose.  You may return to the Emergency Department at any time for worsening condition or any new symptoms that concern you.   If you develop fevers, loss of control of bowel or bladder, weakness or numbness in your legs, or are unable to walk, return to the ER for a recheck. If you develop redness, swelling, pus draining from the wound, or fevers greater than 100.4, return to the ER immediately for a recheck.     Chronic Pain Chronic pain can be defined as pain that is off and on and lasts for 3-6 months or longer. Many things cause chronic pain, which can make it difficult to make a diagnosis. There are many treatment options available for chronic pain. However, finding a treatment that works well for you may require trying various approaches until the right one is found. Many people benefit from a combination of two or more types of treatment to control their pain. SYMPTOMS  Chronic pain can occur anywhere in the body and can range from mild to very severe. Some types of chronic pain include:  Headache.  Low back pain.  Cancer pain.  Arthritis pain.  Neurogenic pain. This is pain resulting from damage to nerves. People with chronic pain may also have other symptoms such as:  Depression.  Anger.  Insomnia.  Anxiety. DIAGNOSIS  Your health care provider will help diagnose your condition over time. In many cases, the initial focus will be on excluding possible conditions that could be causing the pain. Depending on your symptoms, your health care provider may order tests to diagnose your condition. Some of these tests may include:   Blood tests.   CT scan.   MRI.   X-rays.   Ultrasounds.   Nerve conduction studies.  You may need to see a specialist.    TREATMENT  Finding treatment that works well may take time. You may be referred to a pain specialist. He or she may prescribe medicine or therapies, such as:   Mindful meditation or yoga.  Shots (injections) of numbing or pain-relieving medicines into the spine or area of pain.  Local electrical stimulation.  Acupuncture.   Massage therapy.   Aroma, color, light, or sound therapy.   Biofeedback.   Working with a physical therapist to keep from getting stiff.   Regular, gentle exercise.   Cognitive or behavioral therapy.   Group support.  Sometimes, surgery may be recommended.  HOME CARE INSTRUCTIONS   Take all medicines as directed by your health care provider.   Lessen stress in your life by relaxing and doing things such as listening to calming music.   Exercise or be active as directed by your health care provider.   Eat a healthy diet and include things such as vegetables, fruits, fish, and lean meats in your diet.   Keep all follow-up appointments with your health care provider.   Attend a support group with others suffering from chronic pain. SEEK MEDICAL CARE IF:   Your pain gets worse.   You develop a new pain that was not there before.   You cannot tolerate medicines given to you by your health care provider.   You have new symptoms since your last visit with your health care provider.  SEEK IMMEDIATE MEDICAL CARE IF:   You feel  weak.   You have decreased sensation or numbness.   You lose control of bowel or bladder function.   Your pain suddenly gets much worse.   You develop shaking.  You develop chills.  You develop confusion.  You develop chest pain.  You develop shortness of breath.  MAKE SURE YOU:  Understand these instructions.  Will watch your condition.  Will get help right away if you are not doing well or get worse.   This information is not intended to replace advice given to you by your health care  provider. Make sure you discuss any questions you have with your health care provider.   Document Released: 05/03/2002 Document Revised: 04/13/2013 Document Reviewed: 02/04/2013 Elsevier Interactive Patient Education 2016 [employer]  Pain Relief Preoperatively and Postoperatively If you have questions, problems, or concerns about the pain that you may feel after surgery, let your health care provider know.Patients have the right to assessment and management of pain. Severe pain after surgery--and the fear or anxiety associated with that pain--may cause extreme discomfort that:  Prevents sleep.  Decreases the ability to breathe deeply and to cough. This can result in pneumonia or other upper airway infections.  Causes the heart to beat more quickly and the blood pressure to be higher.  Increases the risk for constipation and bloating.  Decreases the ability of wounds to heal.  May result in depression, increased anxiety, and feelings of helplessness. Relieving pain before surgery (preoperatively) is also important because it lessens pain that you have after surgery (postoperatively). Patients who receive pain relief both before and after surgery experience greater pain relief than those who receive pain relief only after surgery. Let your health care provider know if you are having uncontrolled pain.This is very important.Pain after surgery is more difficult to manage if it is severe, so receiving prompt and adequate treatment of acute pain is necessary. If you become constipated after taking pain medicine, drink more liquids if you can. Your health care provider may have you take a mild laxative. PAIN CONTROL METHODS Your health care providers follow policies and procedures about the management of your pain.These guidelines should be explained to you before surgery.Plans for pain control after surgery must be decided upon by you and your health care provider and put into use with your  full understanding and agreement.Do not be afraid to ask questions about the care that you are receiving. Your health care providers will attempt to control your pain in various ways, and these methods may be used together (multimodal analgesia). Using this approach has many benefits for you, including being able to eat, move around, and leave the hospital sooner. As-Needed Pain Control  You may be given pain medicine through an IV tube or as a pill or liquid that you can swallow. Let your health care provider know when you are having pain, and he or she will give you the pain medicine that is ordered for you. IV Patient-Controlled Analgesia (PCA) Pump  You can receive your pain medicine through an IV tube that goes into one of your veins. You can control the amount of pain medicine that you get. The pain medicine is controlled by a pump. When you push the button that is hooked up to this pump, you receive a specific amount of pain medicine. This button should be pushed only by you or by someone who is specifically assigned by you to do so. It is set up to keep you from accidentally giving yourself  too much pain medicine. You will be able to start using your pain pump in the recovery room after your surgery. This method can be helpful for most types of surgery.  Tell your health care provider:  If you are having too much pain.  If you are feeling too sleepy or nauseous. Continuous Epidural Pain Control  A thin, soft tube (catheter) is put into your back, outside the outer layer of your spinal cord. Pain medicine flows through the catheter to lessen pain in areas of your body that are below the level of catheter placement. Continuous epidural pain control may work best for you if you are having surgery on your abdomen, hip area, or legs. The epidural catheter is usually put into your back shortly before surgery. It is left in until you can eat, take medicine by mouth, pass urine, and have a bowel  movement.  Giving pain medicine through the epidural catheter may help you to heal more quickly because you can do these things sooner:  Regain normal bowel and bladder function.  Return to eating.  Get up and walk. Medicine That Numbs the Area (Local Anesthetic) You may be given pain medicine:  As an injection near the area of the pain (local infiltration).  As an injection near the nerve that controls the sensation to a specific part of your body (peripheral nerve block).  In your spine to block pain (spinal block).  Through a local anesthetic reservoir pump. If your surgeon or anesthesiologist selects this option as a part of your pain control, one or more thin, soft tubes will be inserted into your incision site(s) at the end of surgery. These tubes will be connected to a device that is filled with a non-narcotic pain medicine. This medicine gradually empties into your incision site over the next several days. Usually, after all of the medicine is used, your health care provider will remove the tubes and throw away the device. Opioids  Moderate to moderately severe acute pain after surgery may respond to opioids.Opioids are narcotic pain medicine. Opioids are often combined with non-narcotic medicines to improve pain relief, lower the risk of side effects, and reduce the chance of addiction.  If you follow your health care provider's directions about taking opioids and you do not have a history of substance abuse, your risk of becoming addicted is very small.To prevent addiction, opioids are given for short periods of time in careful doses. Other Methods of Pain Control  Steroids.  Physical therapy.  Heat and cold therapy.  Compression, such as wrapping an elastic bandage around the area of the pain.  Massage.   This information is not intended to replace advice given to you by your health care provider. Make sure you discuss any questions you have with your health care  provider.   Document Released: 11/01/2002 Document Revised: 09/01/2014 Document Reviewed: 11/05/2010 Elsevier Interactive Patient Education [employer]

## 2015-07-19 NOTE — ED Notes (Signed)
While vital signs were being taken, pt cussed out staff saying "take this f*cking sh*t off of me" about placing BP cuff and pulse ox on pt. Informed pt that we needed to get vital signs.

## 2015-08-03 ENCOUNTER — Ambulatory Visit (INDEPENDENT_AMBULATORY_CARE_PROVIDER_SITE_OTHER)
Admission: RE | Admit: 2015-08-03 | Discharge: 2015-08-03 | Disposition: A | Discharge status: Home / Self Care | Payer: BLUE CROSS/BLUE SHIELD | Source: Ambulatory Visit | Attending: Internal Medicine | Admitting: Internal Medicine

## 2015-08-03 ENCOUNTER — Ambulatory Visit (INDEPENDENT_AMBULATORY_CARE_PROVIDER_SITE_OTHER): Payer: BLUE CROSS/BLUE SHIELD | Admitting: Internal Medicine

## 2015-08-03 ENCOUNTER — Encounter: Payer: Self-pay | Admitting: Internal Medicine

## 2015-08-03 VITALS — BP 130/88 | HR 103

## 2015-08-03 DIAGNOSIS — R059 Cough, unspecified: Secondary | ICD-10-CM

## 2015-08-03 DIAGNOSIS — R05 Cough: Secondary | ICD-10-CM

## 2015-08-03 NOTE — Patient Instructions (Addendum)
Please remember to go to the  x-ray department downstairs for your tests - we will call you with the results when they are available.  Protonix should be Take 30- 60 min before your first and last meals of the day - add pepcid 20 mg ac at bedtime if still having your am cough   GERD (REFLUX)  is an extremely common cause of respiratory symptoms just like yours , many times with no obvious heartburn at all.    It can be treated with medication, but also with lifestyle changes including elevation of the head of your bed (ideally with 6 inch  bed blocks),  Smoking cessation, avoidance of late meals, excessive alcohol, and avoid fatty foods, chocolate, peppermint, colas, red wine, and acidic juices such as orange juice.  NO MINT OR MENTHOL PRODUCTS SO NO COUGH DROPS  USE SUGARLESS CANDY INSTEAD (Jolley ranchers or Stover's or Life Savers) or even ice chips will also do - the key is to swallow to prevent all throat clearing. NO OIL BASED VITAMINS - use powdered substitutes.

## 2015-08-03 NOTE — Progress Notes (Signed)
Subjective:    Patient ID: [name], male    DOB: 06/14/1961,   MRN: [id number]  HPI  [id number] yowm quit smoking 2009 no respiratory problems at all until admit with legionella pna   Admit date: 01/10/2015 Discharge date: 01/25/2015  Recommendations for Outpatient Follow-up:  1. Stop levaquin 6/9 2. Please check Cmet in 1 week to FU Alkaline phosphatase 3. Titrate insulin dose  Discharge Diagnoses:  Active Problems:  Legionella pneumonia  ARDS (adult respiratory distress syndrome)  Acute respiratory failure with hypoxia  ICU delirium  Elevated alkaline phosphatase  Elevated bilirubin  Pressure ulcer  Chronic pain  Discharge Condition: stable  Diet recommendation: diabetic  Filed Weights   01/22/15 0500 01/23/15 0342 01/24/15 2008  Weight: 81.2 kg (179 lb 0.2 oz) 81.1 kg (178 lb 12.7 oz) 73.936 kg (163 lb)    History of present illness:  CHIEF COMPLAINT: SOB, cough 54 y.o. M [facility] late 5/18 PM with acute hypoxic respiratory failure and severe bilateral infiltrates. Required intubation in AM 5/19  MAJOR EVENTS/TEST RESULTS:  Hospital Course:  Acute hypoxemic respiratory failure  -From ARDS due to Legionella pneumonia and MSSA superinfection in trach aspirate -improved greatly, s/p VDRF -to complete 21days of levaquin for this, stop date 6/9 -weaned off O2  ICU delirium -required precedex gtt -resolved  Diarrhea -due to Legionella -improved, Cdiff PCR negative  Elevated alkaline phosphatase -etiology unclear, suspected to be from legionella -had US while in ICU which was unremarkable -s/p cholecystectomy, improving, needs this rechecked as outpatient  DM -stable, continue lantus, SSI -dose adjusted based on inpatient CBGs  Hypokalemia -due to diarrhea -replaced  Chronic pain -resume Home meds, Morphine sulphate, oxycodone and gabapentin -changed gabapentin from 1200mg  TID to 400mg  TID    Consultations:  PCCM transfer      08/03/2015 1st [facility] office visit/ Lura Falor   Chief Complaint  Patient presents with  . Pulmonary Consult    Referred by Dr. Jannifer Rodneyhristy Hawks. Pt states developed legionella infection in May 2016. He still has some prod cough with gray sputum.   cough worse in  am's/  Off acei ? since summer but was discharged on Altace  Raspy voice present before illness with admit notes stating he was on ACEi at admit  Much more limited by back pain than breathing at this point Pt and wife concerned because the urine test for Legionella is apparently still positive and yet he has not had any purulent sputum fever altered mental status or worsening dyspnea since discharge - apparently already advised that no further antibiotics are necessary by the infectious disease department  No obvious other patterns in day to day or daytime variabilty or assoc   cp or chest tightness, subjective wheeze overt sinus or hb symptoms. No unusual exp hx or h/o childhood pna/ asthma or knowledge of premature birth.  Sleeping ok without nocturnal  or early am exacerbation  of respiratory  c/o's or need for noct saba. Also denies any obvious fluctuation of symptoms with weather or environmental changes or other aggravating or alleviating factors except as outlined above   Current Medications, Allergies, Complete Past Medical History, Past Surgical History, Family History, and Social History were reviewed in [facility] electronic medical record.            Review of Systems  Constitutional: Negative for fever, chills, activity change, appetite change and unexpected weight change.  HENT: Negative for congestion, dental problem, postnasal drip, rhinorrhea, sneezing, sore  throat, trouble swallowing and voice change.   Eyes: Negative for visual disturbance.  Respiratory: Positive for cough and shortness of breath. Negative for choking.   Cardiovascular: Negative for chest pain  and leg swelling.  Gastrointestinal: Negative for nausea, vomiting and abdominal pain.  Genitourinary: Negative for difficulty urinating.  Musculoskeletal: Positive for arthralgias.  Skin: Negative for rash.  Psychiatric/Behavioral: Negative for behavioral problems and confusion.       Objective:   Physical Exam  amb wm very hoarse/ nad   Wt Readings from Last 3 Encounters:  07/16/15 172 lb (78.019 kg)  01/31/15 153 lb 6.4 oz (69.582 kg)  01/24/15 163 lb (73.936 kg)    Vital signs reviewed  HEENT: nl dentition, turbinates, and oropharynx. Nl external ear canals without cough reflex   NECK :  without JVD/Nodes/TM/ nl carotid upstrokes bilaterally   LUNGS: no acc muscle use,  Nl contour chest which is clear to A and P bilaterally without cough on insp or exp maneuvers   CV:  RRR  no s3 or murmur or increase in P2, no edema   ZOX:WRUEAVW very tight back brace/ abd binder   MS:  Nl gait/ ext warm without deformities, calf tenderness, cyanosis or clubbing No obvious joint restrictions   SKIN: warm and dry without lesions    NEURO:  alert, approp, nl sensorium with  no motor deficits    CXR PA and Lateral:   08/03/2015 :    I personally reviewed images and agree with radiology impression as follows:   There is no evidence of pneumonia nor CHF. There are stable chronic bronchitic-fibrotic changes.      Assessment & Plan:

## 2015-08-04 ENCOUNTER — Encounter: Payer: Self-pay | Admitting: Internal Medicine

## 2015-08-04 NOTE — Assessment & Plan Note (Addendum)
The most common causes of chronic cough in immunocompetent adults include the following: upper airway cough syndrome (UACS), previously referred to as postnasal drip syndrome (PNDS), which is caused by variety of rhinosinus conditions; (2) asthma; (3) GERD; (4) chronic bronchitis from cigarette smoking or other inhaled environmental irritants; (5) nonasthmatic eosinophilic bronchitis; and (6) bronchiectasis.   These conditions, singly or in combination, have accounted for up to 94% of the causes of chronic cough in prospective studies.   Other conditions have constituted no >6% of the causes in prospective studies These have included bronchogenic carcinoma, chronic interstitial pneumonia, sarcoidosis, left ventricular failure, ACEI-induced cough, and aspiration from a condition associated with pharyngeal dysfunction.    Chronic cough is often simultaneously caused by more than one condition. A single cause has been found from 38 to 82% of the time, multiple causes from 18 to 62%. Multiply caused cough has been the result of three diseases up to 42% of the time.       Based on hx and exam, this is most likely:  Classic Upper airway cough syndrome, so named because it's frequently impossible to sort out how much is  CR/sinusitis with freq throat clearing (which can be related to primary GERD)   vs  causing  secondary (" extra esophageal")  GERD from wide swings in gastric pressure that occur with throat clearing, often  promoting self use of mint and menthol lozenges that reduce the lower esophageal sphincter tone and exacerbate the problem further in a cyclical fashion.   These are the same pts (now being labeled as having "irritable larynx syndrome" by some cough centers) who not infrequently have a history of having failed to tolerate ace inhibitors,  dry powder inhalers or biphosphonates or report having atypical reflux symptoms that don't respond to standard doses of PPI (his age/ wt/ abd binder and  cough dating back to pre-admit all place him at risk of ongoing acid and non acid gerd)  , and are easily confused as having aecopd or asthma flares by even experienced allergists/ pulmonologists.   The first step is to maximize acid suppression and eliminate cyclical coughing then regroup if the cough persists or has limitation related to doe p first addressing the cough adequately     I had an extended discussion with the patient reviewing all relevant studies completed to date and  lasting 35 min/ 60 min ov  There is no evidence whatsoever that he has active legionella infection and there is no indication to continue to check the urine for it. I suspect this is either a false positive or the result of a previous legionella infection, not an active one  Each maintenance medication was reviewed in detail including most importantly the difference between maintenance and as needed and under what circumstances the prns are to be used.  Please see instructions for details which were reviewed in writing and the patient given a copy.

## 2015-08-07 NOTE — Progress Notes (Signed)
Quick Note:  LMTCB ______ 

## 2015-08-08 ENCOUNTER — Telehealth: Payer: Self-pay | Admitting: Internal Medicine

## 2015-08-08 NOTE — Telephone Encounter (Signed)
Result Notes     Notes Recorded by Christen ButterLeslie M Raskin, CMA on 08/07/2015 at 12:16 PM LMTCB ------  Notes Recorded by Tommie SamsMindy S Silva, CMA on 08/06/2015 at 9:38 AM LMTCB x1 ------  Notes Recorded by Nyoka CowdenMichael B Wert, MD on 08/03/2015 at 5:35 PM Call pt: Reviewed cxr and no acute change [facility]   Spoke with pt. He is aware of his results. Nothing further was needed.

## 2015-11-22 ENCOUNTER — Telehealth: Payer: Self-pay | Admitting: Internal Medicine

## 2015-11-22 NOTE — Telephone Encounter (Signed)
Pt here requesting records from 08/03/15 visit with [facility] Needs OV notes and CXR results.  Records printed and given to patient.  Nothing further needed.

## 2016-03-14 ENCOUNTER — Encounter: Payer: Self-pay | Admitting: Family

## 2016-03-14 ENCOUNTER — Ambulatory Visit (INDEPENDENT_AMBULATORY_CARE_PROVIDER_SITE_OTHER): Payer: BLUE CROSS/BLUE SHIELD

## 2016-03-14 ENCOUNTER — Ambulatory Visit (INDEPENDENT_AMBULATORY_CARE_PROVIDER_SITE_OTHER): Payer: BLUE CROSS/BLUE SHIELD | Admitting: Family

## 2016-03-14 VITALS — BP 113/75 | HR 100 | Temp 97.3°F | Ht 68.0 in | Wt 165.0 lb

## 2016-03-14 DIAGNOSIS — L03115 Cellulitis of right lower limb: Secondary | ICD-10-CM

## 2016-03-14 DIAGNOSIS — M25561 Pain in right knee: Secondary | ICD-10-CM

## 2016-03-14 LAB — CMP14+EGFR
A/G RATIO: 1.2 (ref 1.2–2.2)
ALBUMIN: 3.8 g/dL (ref 3.5–5.5)
ALT: 37 IU/L (ref 0–44)
AST: 97 IU/L — ABNORMAL HIGH (ref 0–40)
Alkaline Phosphatase: 420 IU/L — ABNORMAL HIGH (ref 39–117)
BILIRUBIN TOTAL: 0.8 mg/dL (ref 0.0–1.2)
BUN / CREAT RATIO: 13 (ref 9–20)
BUN: 13 mg/dL (ref 6–24)
CALCIUM: 8.9 mg/dL (ref 8.7–10.2)
CHLORIDE: 92 mmol/L — AB (ref 96–106)
CO2: 21 mmol/L (ref 18–29)
Creatinine, Ser: 0.98 mg/dL (ref 0.76–1.27)
GFR, EST AFRICAN AMERICAN: 101 mL/min/{1.73_m2} (ref >59)
GFR, EST NON AFRICAN AMERICAN: 88 mL/min/{1.73_m2} (ref >59)
GLOBULIN, TOTAL: 3.2 g/dL (ref 1.5–4.5)
Glucose: 281 mg/dL — ABNORMAL HIGH (ref 65–99)
POTASSIUM: 4 mmol/L (ref 3.5–5.2)
SODIUM: 136 mmol/L (ref 134–144)
TOTAL PROTEIN: 7 g/dL (ref 6.0–8.5)

## 2016-03-14 LAB — CBC WITH DIFFERENTIAL/PLATELET
BASOS: 0 %
Basophils Absolute: 0 10*3/uL (ref 0.0–0.2)
EOS (ABSOLUTE): 0.1 10*3/uL (ref 0.0–0.4)
EOS: 1 %
HEMATOCRIT: 49 % (ref 37.5–51.0)
HEMOGLOBIN: 16.1 g/dL (ref 12.6–17.7)
IMMATURE GRANS (ABS): 0 10*3/uL (ref 0.0–0.1)
Immature Granulocytes: 1 %
LYMPHS: 36 %
Lymphocytes Absolute: 3 10*3/uL (ref 0.7–3.1)
MCH: 32.9 pg (ref 26.6–33.0)
MCHC: 32.9 g/dL (ref 31.5–35.7)
MCV: 100 fL — AB (ref 79–97)
Monocytes Absolute: 0.8 10*3/uL (ref 0.1–0.9)
Monocytes: 10 %
NEUTROS ABS: 4.4 10*3/uL (ref 1.4–7.0)
Neutrophils: 52 %
PLATELETS: 162 10*3/uL (ref 150–379)
RBC: 4.89 x10E6/uL (ref 4.14–5.80)
RDW: 14.5 % (ref 12.3–15.4)
WBC: 8.4 10*3/uL (ref 3.4–10.8)

## 2016-03-14 MED ORDER — CEFTRIAXONE SODIUM 1 G IJ SOLR
1.0000 g | Freq: Once | INTRAMUSCULAR | Status: AC
  Administered 2016-03-14: 1 g via INTRAMUSCULAR

## 2016-03-14 MED ORDER — METHOCARBAMOL 500 MG PO TABS: 500.0000 mg | ORAL_TABLET | Freq: Four times a day (QID) | ORAL | Status: DC

## 2016-03-14 MED ORDER — DOXYCYCLINE HYCLATE 100 MG PO TABS: 100.0000 mg | ORAL_TABLET | Freq: Two times a day (BID) | ORAL | Status: DC

## 2016-03-14 NOTE — Progress Notes (Signed)
   Subjective:    Patient ID: [name], male    DOB: 1961-04-21, 55 y.o.   MRN: [id number]  Knee Pain  The incident occurred more than 1 week ago. The injury mechanism was a direct blow. The pain is present in the right knee. The quality of the pain is described as aching. The pain is at a severity of 8/10. The pain is moderate. The pain has been intermittent since onset. Pertinent negatives include no numbness or tingling. He reports no foreign bodies present. The symptoms are aggravated by movement. He has tried NSAIDs and rest for the symptoms. The treatment provided mild relief.      Review of Systems  Respiratory: Negative.   Cardiovascular: Negative.   Musculoskeletal: Positive for joint swelling.  Neurological: Negative for tingling and numbness.  All other systems reviewed and are negative.      Objective:   Physical Exam  Constitutional: He is oriented to person, place, and time. He appears well-developed and well-nourished. No distress.  HENT:  Head: Normocephalic.  Eyes: Pupils are equal, round, and reactive to light. Right eye exhibits no discharge. Left eye exhibits no discharge.  Neck: Normal range of motion. Neck supple. No thyromegaly present.  Cardiovascular: Normal rate, regular rhythm, normal heart sounds and intact distal pulses.   No murmur heard. Pulmonary/Chest: Effort normal and breath sounds normal. No respiratory distress. He has no wheezes.  Abdominal: Soft. Bowel sounds are normal. He exhibits no distension. There is no tenderness.  Musculoskeletal: Normal range of motion. He exhibits edema (2+ in right knee) and tenderness (extreme tenderness with any amount of palpation of medial knee, warm to touch, no drainage or redness  present).  Neurological: He is alert and oriented to person, place, and time. He has normal reflexes. No cranial nerve deficit.  Skin: Skin is warm and dry. No rash noted. No erythema.  Psychiatric: He has a normal mood and  affect. His behavior is normal. Judgment and thought content normal.  Vitals reviewed.     BP 113/75 mmHg  Pulse 100  Temp(Src) 97.3 F (36.3 C) (Oral)  Ht '5\' 8"'$  (1.727 m)  Wt 165 lb (74.844 kg)  BMI 25.09 kg/m2     Assessment & Plan:  1. Right knee pain - DG Knee 1-2 Views Right; Future - CBC with Differential/Platelet - CMP14+EGFR  2. Cellulitis of knee, right - CBC with Differential/Platelet - CMP14+EGFR - cefTRIAXone (ROCEPHIN) injection 1 g; Inject 1 g into the muscle once. - doxycycline (VIBRA-TABS) 100 MG tablet; Take 1 tablet (100 mg total) by mouth 2 (two) times daily.  Dispense: 20 tablet; Refill: 0   Rest Ice Keep elevated RTO on 03/17/16  Evelina Dun, FNP

## 2016-03-14 NOTE — Patient Instructions (Addendum)
Cellulitis Cellulitis is an infection of the skin and the tissue beneath it. The infected area is usually red and tender. Cellulitis occurs most often in the arms and lower legs.  CAUSES  Cellulitis is caused by bacteria that enter the skin through cracks or cuts in the skin. The most common types of bacteria that cause cellulitis are staphylococci and streptococci. SIGNS AND SYMPTOMS   Redness and warmth.  Swelling.  Tenderness or pain.  Fever. DIAGNOSIS  Your health care provider can usually determine what is wrong based on a physical exam. Blood tests may also be done. TREATMENT  Treatment usually involves taking an antibiotic medicine. HOME CARE INSTRUCTIONS   Take your antibiotic medicine as directed by your health care provider. Finish the antibiotic even if you start to feel better.  Keep the infected arm or leg elevated to reduce swelling.  Apply a warm cloth to the affected area up to 4 times per day to relieve pain.  Take medicines only as directed by your health care provider.  Keep all follow-up visits as directed by your health care provider. SEEK MEDICAL CARE IF:   You notice red streaks coming from the infected area.  Your red area gets larger or turns dark in color.  Your bone or joint underneath the infected area becomes painful after the skin has healed.  Your infection returns in the same area or another area.  You notice a swollen bump in the infected area.  You develop new symptoms.  You have a fever. SEEK IMMEDIATE MEDICAL CARE IF:   You feel very sleepy.  You develop vomiting or diarrhea.  You have a general ill feeling (malaise) with muscle aches and pains.   This information is not intended to replace advice given to you by your health care provider. Make sure you discuss any questions you have with your health care provider.   Document Released: 05/21/2005 Document Revised: 05/02/2015 Document Reviewed: 10/27/2011 Elsevier Interactive  Patient Education 2016 [employer] Knee Effusion Knee effusion means that you have excess fluid in your knee joint. This can cause pain and swelling in your knee. This may make your knee more difficult to bend and move. That is because there is increased pain and pressure in the joint. If there is fluid in your knee, it often means that something is wrong inside your knee, such as severe arthritis, abnormal inflammation, or an infection. Another common cause of knee effusion is an injury to the knee muscles, ligaments, or cartilage. HOME CARE INSTRUCTIONS  Use crutches as directed by your health care provider.  Wear a knee brace as directed by your health care provider.  Apply ice to the swollen area:  Put ice in a plastic bag.  Place a towel between your skin and the bag.  Leave the ice on for 20 minutes, 2-3 times per day.  Keep your knee raised (elevated) when you are sitting or lying down.  Take medicines only as directed by your health care provider.  Do any rehabilitation or strengthening exercises as directed by your health care provider.  Rest your knee as directed by your health care provider. You may start doing your normal activities again when your health care provider approves.   Keep all follow-up visits as directed by your health care provider. This is important. SEEK MEDICAL CARE IF:  You have ongoing (persistent) pain in your knee. SEEK IMMEDIATE MEDICAL CARE IF:  You have increased swelling or redness of your knee.  You  have severe pain in your knee.  You have a fever.   This information is not intended to replace advice given to you by your health care provider. Make sure you discuss any questions you have with your health care provider.   Document Released: 11/01/2003 Document Revised: 09/01/2014 Document Reviewed: 03/27/2014 Elsevier Interactive Patient Education [employer]

## 2016-03-17 ENCOUNTER — Encounter: Payer: Self-pay | Admitting: Family

## 2016-03-17 ENCOUNTER — Ambulatory Visit (INDEPENDENT_AMBULATORY_CARE_PROVIDER_SITE_OTHER): Payer: BLUE CROSS/BLUE SHIELD | Admitting: Family

## 2016-03-17 VITALS — BP 137/87 | HR 91 | Temp 97.6°F | Ht 68.0 in | Wt 171.0 lb

## 2016-03-17 DIAGNOSIS — M25561 Pain in right knee: Secondary | ICD-10-CM | POA: Diagnosis not present

## 2016-03-17 DIAGNOSIS — M25461 Effusion, right knee: Secondary | ICD-10-CM | POA: Diagnosis not present

## 2016-03-17 MED ORDER — METHYLPREDNISOLONE ACETATE 40 MG/ML IJ SUSP: 40.0000 mg | Freq: Once | INTRAMUSCULAR | 0 refills | Status: AC

## 2016-03-17 MED ORDER — BUPIVACAINE HCL 0.25 % IJ SOLN
1.0000 mL | Freq: Once | INTRAMUSCULAR | 0 refills | Status: AC
Start: 2016-03-17 — End: 2016-03-17

## 2016-03-17 NOTE — Patient Instructions (Signed)

## 2016-03-17 NOTE — Progress Notes (Signed)
   Subjective:    Patient ID: [name], male    DOB: September 30, 1960, 55 y.o.   MRN: [id number]  PT presents to the office today to recheck right knee pain. PT was seen in the clinic 03/14/16 and given rocephin 1 g and doxycycline 100 mg BID for 10 days. Pt states his pain is unchanged.  Knee Pain   The incident occurred more than 1 week ago. The injury mechanism was a direct blow. The pain is present in the right knee. The quality of the pain is described as aching. The pain is at a severity of 8/10. The pain is moderate. The pain has been intermittent since onset. Pertinent negatives include no numbness or tingling. He reports no foreign bodies present. The symptoms are aggravated by movement. He has tried NSAIDs and rest for the symptoms. The treatment provided mild relief.      Review of Systems  Respiratory: Negative.   Cardiovascular: Negative.   Musculoskeletal: Positive for joint swelling.  Neurological: Negative for tingling and numbness.  All other systems reviewed and are negative.      Objective:   Physical Exam  Constitutional: He is oriented to person, place, and time. He appears well-developed and well-nourished. No distress.  HENT:  Head: Normocephalic.  Eyes: Pupils are equal, round, and reactive to light. Right eye exhibits no discharge. Left eye exhibits no discharge.  Neck: Normal range of motion. Neck supple. No thyromegaly present.  Cardiovascular: Normal rate, regular rhythm, normal heart sounds and intact distal pulses.   No murmur heard. Pulmonary/Chest: Effort normal and breath sounds normal. No respiratory distress. He has no wheezes.  Abdominal: Soft. Bowel sounds are normal. He exhibits no distension. There is no tenderness.  Musculoskeletal: Normal range of motion. He exhibits edema (2+ in right knee) and tenderness (extreme tenderness with any amount of palpation of medial knee, warm to touch, no drainage or redness  present).  Neurological: He is alert  and oriented to person, place, and time. He has normal reflexes. No cranial nerve deficit.  Skin: Skin is warm and dry. No rash noted. No erythema.  Psychiatric: He has a normal mood and affect. His behavior is normal. Judgment and thought content normal.  Vitals reviewed.     BP 137/87   Pulse 91   Temp 97.6 F (36.4 C) (Oral)   Ht 5\' 8"  (1.727 m)   Wt 171 lb (77.6 kg)   BMI 26.00 kg/m      Assessment & Plan:  1. Right knee pain - Ambulatory referral to Orthopedic Surgery - methylPREDNISolone acetate (DEPO-MEDROL) 40 MG/ML injection; Inject 1 mL (40 mg total) into the articular space once.  Dispense: 1 mL; Refill: 0 - bupivacaine (MARCAINE) 0.25 % injection; 1 mL by Infiltration route once.  Dispense: 1 mL; Refill: 0  2. Effusion of right knee joint - Ambulatory referral to Orthopedic Surgery - methylPREDNISolone acetate (DEPO-MEDROL) 40 MG/ML injection; Inject 1 mL (40 mg total) into the articular space once.  Dispense: 1 mL; Refill: 0 - bupivacaine (MARCAINE) 0.25 % injection; 1 mL by Infiltration route once.  Dispense: 1 mL; Refill: 0  Rest  Ice Keep elevated Low carb diet RTO prn  Jannifer Rodney, FNP

## 2016-03-18 ENCOUNTER — Telehealth: Payer: Self-pay | Admitting: Family

## 2016-03-18 NOTE — Telephone Encounter (Signed)
Received phone call from [facility] pharmacy stating that a Rx for bupevicane and depomedrol was sent to pharmacy.  Informed pharmacy to disregard Rx that injection was given in our office.

## 2016-03-27 DIAGNOSIS — M25561 Pain in right knee: Secondary | ICD-10-CM | POA: Diagnosis not present

## 2016-05-09 ENCOUNTER — Other Ambulatory Visit: Payer: Self-pay | Admitting: Family

## 2016-05-09 NOTE — Telephone Encounter (Signed)
Please review and advise.

## 2016-05-12 ENCOUNTER — Telehealth: Payer: Self-pay | Admitting: Family

## 2016-07-02 ENCOUNTER — Telehealth: Payer: Self-pay | Admitting: Family

## 2016-07-03 NOTE — Telephone Encounter (Signed)
[facility]

## 2016-08-22 ENCOUNTER — Telehealth: Payer: Self-pay | Admitting: Family Medicine

## 2016-08-22 DIAGNOSIS — Z794 Long term (current) use of insulin: Principal | ICD-10-CM

## 2016-08-22 DIAGNOSIS — E1165 Type 2 diabetes mellitus with hyperglycemia: Secondary | ICD-10-CM

## 2016-08-22 MED ORDER — INSULIN GLARGINE 100 UNIT/ML SOLOSTAR PEN: 37.0000 [IU] | PEN_INJECTOR | Freq: Every day | SUBCUTANEOUS | 4 refills | Status: DC

## 2016-08-22 NOTE — Telephone Encounter (Signed)
Lantus Prescription sent to pharmacy.  

## 2016-08-28 ENCOUNTER — Encounter: Payer: Self-pay | Admitting: Family Medicine

## 2016-08-28 ENCOUNTER — Ambulatory Visit (INDEPENDENT_AMBULATORY_CARE_PROVIDER_SITE_OTHER): Payer: BLUE CROSS/BLUE SHIELD | Admitting: Family Medicine

## 2016-08-28 VITALS — BP 130/86 | HR 91 | Temp 98.9°F | Ht 68.0 in | Wt 169.0 lb

## 2016-08-28 DIAGNOSIS — Z794 Long term (current) use of insulin: Secondary | ICD-10-CM | POA: Diagnosis not present

## 2016-08-28 DIAGNOSIS — E876 Hypokalemia: Secondary | ICD-10-CM | POA: Diagnosis not present

## 2016-08-28 DIAGNOSIS — E1142 Type 2 diabetes mellitus with diabetic polyneuropathy: Secondary | ICD-10-CM | POA: Diagnosis not present

## 2016-08-28 LAB — BAYER DCA HB A1C WAIVED: HB A1C (BAYER DCA - WAIVED): 8.3 % — ABNORMAL HIGH (ref <7.0)

## 2016-08-28 MED ORDER — POTASSIUM CHLORIDE ER 20 MEQ PO TBCR: 20.0000 meq | EXTENDED_RELEASE_TABLET | Freq: Every day | ORAL | 1 refills | Status: DC

## 2016-08-28 NOTE — Progress Notes (Signed)
BP 130/86   Pulse 91   Temp 98.9 F (37.2 C) (Oral)   Ht '5\' 8"'$  (1.727 m)   Wt 169 lb (76.7 kg)   BMI 25.70 kg/m    Subjective:    Patient ID: [name], male    DOB: [birth year], 56 y.o.   MRN: [id number]  HPI: [name] is a 56 y.o. male presenting on 08/28/2016 for Diabetes (followup)   HPI Diabetes and hypokalemia Patient is coming in for diabetes recheck. He is currently on Lantus 37 units at bedtime and NovoLog 5-7 units 3 times a day with meals and sometimes 4 times a day when he has snacks. He is mostly managed through this by the [place] but comes here for additional support and management. At the [facility] he was recently diagnosed with hypokalemia likely secondary to diuretic use and insulin use, they recommend for him to go on a potassium supplement and he has been taking it and he wants to get his levels rechecked today and possibly get a prescription for potassium supplement. He has seen an ophthalmologist through the [place] system this year but we do not have the records for that  Relevant past medical, surgical, family and social history reviewed and updated as indicated. Interim medical history since our last visit reviewed. Allergies and medications reviewed and updated.  Review of Systems  Constitutional: Negative for chills and fever.  Respiratory: Negative for shortness of breath and wheezing.   Cardiovascular: Negative for chest pain and leg swelling.  Musculoskeletal: Negative for back pain and gait problem.  Skin: Negative for rash.  Neurological: Negative for dizziness, light-headedness and headaches.  All other systems reviewed and are negative.   Per HPI unless specifically indicated above     Objective:    BP 130/86   Pulse 91   Temp 98.9 F (37.2 C) (Oral)   Ht '5\' 8"'$  (1.727 m)   Wt 169 lb (76.7 kg)   BMI 25.70 kg/m   Wt Readings from Last 3 Encounters:  08/28/16 169 lb (76.7 kg)  03/17/16 171 lb (77.6 kg)  03/14/16 165 lb (74.8 kg)    Physical Exam  Constitutional: He is oriented to person, place, and time. He appears well-developed and well-nourished. No distress.  Eyes: Conjunctivae are normal. Right eye exhibits no discharge. Left eye exhibits no discharge. No scleral icterus.  Cardiovascular: Normal rate, regular rhythm, normal heart sounds and intact distal pulses.   No murmur heard. Pulmonary/Chest: Effort normal and breath sounds normal. No respiratory distress. He has no wheezes.  Musculoskeletal: Normal range of motion. He exhibits no edema.  Neurological: He is alert and oriented to person, place, and time. Coordination normal.  Skin: Skin is warm and dry. No rash noted. He is not diaphoretic.  Psychiatric: He has a normal mood and affect. His behavior is normal.  Nursing note and vitals reviewed.     Assessment & Plan:   Problem List Items Addressed This Visit      Endocrine   Type 2 diabetes mellitus ([place]) - Primary   Relevant Orders   Bayer DCA Hb A1c Waived    Other Visit Diagnoses    Hypokalemia       Relevant Medications   Potassium Chloride ER 20 MEQ TBCR   Other Relevant Orders   BMP8+EGFR       Follow up plan: Return if symptoms worsen or fail to improve.  Counseling provided for all of the vaccine components Orders Placed This  Encounter  Procedures  . Bayer DCA Hb A1c Waived  . BMP8+EGFR    Caryl Pina, MD [facility] 08/28/2016, 5:13 PM

## 2016-08-29 LAB — BMP8+EGFR
BUN/Creatinine Ratio: 18 (ref 9–20)
BUN: 17 mg/dL (ref 6–24)
CALCIUM: 9.3 mg/dL (ref 8.7–10.2)
CHLORIDE: 95 mmol/L — AB (ref 96–106)
CO2: 23 mmol/L (ref 18–29)
Creatinine, Ser: 0.92 mg/dL (ref 0.76–1.27)
GFR, EST AFRICAN AMERICAN: 109 mL/min/{1.73_m2} (ref >59)
GFR, EST NON AFRICAN AMERICAN: 94 mL/min/{1.73_m2} (ref >59)
Glucose: 147 mg/dL — ABNORMAL HIGH (ref 65–99)
POTASSIUM: 4.8 mmol/L (ref 3.5–5.2)
SODIUM: 137 mmol/L (ref 134–144)

## 2016-11-28 ENCOUNTER — Encounter (HOSPITAL_COMMUNITY): Payer: Self-pay | Admitting: Emergency Medicine

## 2016-11-28 ENCOUNTER — Emergency Department (HOSPITAL_COMMUNITY): Payer: BLUE CROSS/BLUE SHIELD

## 2016-11-28 ENCOUNTER — Inpatient Hospital Stay (HOSPITAL_COMMUNITY)
Admission: EM | Admit: 2016-11-28 | Discharge: 2016-12-01 | DRG: 552 | Disposition: A | Discharge status: Home Health | Payer: BLUE CROSS/BLUE SHIELD | Attending: Internal Medicine | Admitting: Internal Medicine

## 2016-11-28 DIAGNOSIS — W010XXA Fall on same level from slipping, tripping and stumbling without subsequent striking against object, initial encounter: Secondary | ICD-10-CM | POA: Diagnosis present

## 2016-11-28 DIAGNOSIS — S32010A Wedge compression fracture of first lumbar vertebra, initial encounter for closed fracture: Secondary | ICD-10-CM | POA: Diagnosis not present

## 2016-11-28 DIAGNOSIS — E11649 Type 2 diabetes mellitus with hypoglycemia without coma: Secondary | ICD-10-CM | POA: Diagnosis not present

## 2016-11-28 DIAGNOSIS — W19XXXA Unspecified fall, initial encounter: Secondary | ICD-10-CM

## 2016-11-28 DIAGNOSIS — Y92230 Patient room in hospital as the place of occurrence of the external cause: Secondary | ICD-10-CM | POA: Diagnosis not present

## 2016-11-28 DIAGNOSIS — E119 Type 2 diabetes mellitus without complications: Secondary | ICD-10-CM

## 2016-11-28 DIAGNOSIS — E1165 Type 2 diabetes mellitus with hyperglycemia: Secondary | ICD-10-CM

## 2016-11-28 DIAGNOSIS — Z79891 Long term (current) use of opiate analgesic: Secondary | ICD-10-CM | POA: Diagnosis not present

## 2016-11-28 DIAGNOSIS — R400 Somnolence: Secondary | ICD-10-CM | POA: Diagnosis not present

## 2016-11-28 DIAGNOSIS — M545 Low back pain, unspecified: Secondary | ICD-10-CM

## 2016-11-28 DIAGNOSIS — S32029A Unspecified fracture of second lumbar vertebra, initial encounter for closed fracture: Secondary | ICD-10-CM

## 2016-11-28 DIAGNOSIS — M5440 Lumbago with sciatica, unspecified side: Secondary | ICD-10-CM | POA: Diagnosis not present

## 2016-11-28 DIAGNOSIS — D696 Thrombocytopenia, unspecified: Secondary | ICD-10-CM | POA: Diagnosis not present

## 2016-11-28 DIAGNOSIS — M810 Age-related osteoporosis without current pathological fracture: Secondary | ICD-10-CM | POA: Diagnosis present

## 2016-11-28 DIAGNOSIS — Z79899 Other long term (current) drug therapy: Secondary | ICD-10-CM

## 2016-11-28 DIAGNOSIS — S32028A Other fracture of second lumbar vertebra, initial encounter for closed fracture: Secondary | ICD-10-CM | POA: Diagnosis not present

## 2016-11-28 DIAGNOSIS — T402X5A Adverse effect of other opioids, initial encounter: Secondary | ICD-10-CM | POA: Diagnosis not present

## 2016-11-28 DIAGNOSIS — Z794 Long term (current) use of insulin: Secondary | ICD-10-CM

## 2016-11-28 DIAGNOSIS — I10 Essential (primary) hypertension: Secondary | ICD-10-CM | POA: Diagnosis not present

## 2016-11-28 DIAGNOSIS — G8929 Other chronic pain: Secondary | ICD-10-CM | POA: Diagnosis present

## 2016-11-28 DIAGNOSIS — E1142 Type 2 diabetes mellitus with diabetic polyneuropathy: Secondary | ICD-10-CM | POA: Diagnosis not present

## 2016-11-28 DIAGNOSIS — K219 Gastro-esophageal reflux disease without esophagitis: Secondary | ICD-10-CM | POA: Diagnosis present

## 2016-11-28 DIAGNOSIS — R0902 Hypoxemia: Secondary | ICD-10-CM | POA: Diagnosis not present

## 2016-11-28 DIAGNOSIS — S32000A Wedge compression fracture of unspecified lumbar vertebra, initial encounter for closed fracture: Secondary | ICD-10-CM | POA: Diagnosis present

## 2016-11-28 DIAGNOSIS — Z87891 Personal history of nicotine dependence: Secondary | ICD-10-CM | POA: Diagnosis not present

## 2016-11-28 DIAGNOSIS — G4730 Sleep apnea, unspecified: Secondary | ICD-10-CM | POA: Diagnosis not present

## 2016-11-28 DIAGNOSIS — M5416 Radiculopathy, lumbar region: Secondary | ICD-10-CM | POA: Diagnosis not present

## 2016-11-28 DIAGNOSIS — S3992XA Unspecified injury of lower back, initial encounter: Secondary | ICD-10-CM | POA: Diagnosis not present

## 2016-11-28 LAB — CBG MONITORING, ED
GLUCOSE-CAPILLARY: 27 mg/dL — AB (ref 65–99)
Glucose-Capillary: 101 mg/dL — ABNORMAL HIGH (ref 65–99)

## 2016-11-28 MED ORDER — HYDROMORPHONE HCL 1 MG/ML IJ SOLN
1.0000 mg | Freq: Once | INTRAMUSCULAR | Status: AC
  Administered 2016-11-28: 1 mg via INTRAVENOUS
  Filled 2016-11-28: qty 1

## 2016-11-28 MED ORDER — ONDANSETRON HCL 4 MG/2ML IJ SOLN
4.0000 mg | Freq: Once | INTRAMUSCULAR | Status: AC
  Administered 2016-11-28: 4 mg via INTRAVENOUS
  Filled 2016-11-28: qty 2

## 2016-11-28 MED ORDER — DEXTROSE 50 % IV SOLN
1.0000 | Freq: Once | INTRAVENOUS | Status: AC
  Administered 2016-11-28: 50 mL via INTRAVENOUS

## 2016-11-28 MED ORDER — DEXTROSE 50 % IV SOLN
INTRAVENOUS | Status: AC
  Filled 2016-11-28: qty 50

## 2016-11-28 NOTE — ED Notes (Signed)
Pt being transported back to MRI at this time due to it not being able to be done the first time.

## 2016-11-28 NOTE — ED Provider Notes (Signed)
I have personally seen and examined the patient. I have reviewed the documentation on PMH/FH/Soc Hx. I have discussed the plan of care with the resident and patient.  I have reviewed and agree with the resident's documentation. Please see associated encounter note.     Nira Conn, MD 11/29/16 [phone]

## 2016-11-28 NOTE — ED Notes (Signed)
Pt transported to MRI 

## 2016-11-28 NOTE — ED Notes (Signed)
Patient remains in MRI at this time.

## 2016-11-28 NOTE — ED Triage Notes (Signed)
Pt sts lower back pain after tripping today on grass; pt sts hx of back sx in past

## 2016-11-28 NOTE — ED Notes (Signed)
Pt called out and stated that he was in an uncomfortable position. This RN and Lubertha Basque moved pt up in bed and repositioned with pillow under both knees.

## 2016-11-28 NOTE — ED Notes (Signed)
Set of cultures drawn with IV start and placed at bedside.

## 2016-11-28 NOTE — ED Provider Notes (Signed)
[facility] DEPT Provider Note   CSN: [id number] Arrival date & time: 11/28/16  1847     History   Chief Complaint Chief Complaint  Patient presents with  . Back Pain    HPI [name] is a 56 y.o. male.  The history is provided by the patient.  Back Pain   This is a new problem. The current episode started 1 to 2 hours ago. The problem occurs constantly. The problem has been gradually worsening. The pain is associated with falling. The pain is present in the lumbar spine. The quality of the pain is described as stabbing, shooting and aching. The pain radiates to the right thigh. The pain is at a severity of 10/10. The pain is severe. The symptoms are aggravated by bending and certain positions. The pain is the same all the time. Associated symptoms include numbness (Pt reports baseline leg numbness which is at baseline). Pertinent negatives include no chest pain, no fever, no weight loss, no headaches, no abdominal pain, no abdominal swelling, no bowel incontinence, no perianal numbness, no bladder incontinence, no dysuria, no paresthesias and no paresis. He has tried nothing for the symptoms. Risk factors include lack of exercise.    Past Medical History:  Diagnosis Date  . Chronic lower back pain   . Compression fracture    lumbar 3  . Diabetes mellitus   . Fracture acetabulum-closed (HCC) 04/09/2013  . GERD (gastroesophageal reflux disease)   . H/O Legionnaire's disease   . Hepatomegaly   . Hypertension   . Migraine   . Osteoporosis   . Pancreatitis   . PTSD (post-traumatic stress disorder)   . Sleep apnea    does not wear CPAP  . Vitamin D deficiency     Patient Active Problem List   Diagnosis Date Noted  . Cough 08/03/2015  . Compression fracture of L3 lumbar vertebra with delayed healing 07/16/2015  . Compression fracture of lumbar vertebra (HCC) 07/16/2015  . Pressure ulcer 01/18/2015  . Acute respiratory failure (HCC)   . Elevated bilirubin   .  Acute respiratory failure with hypoxia (HCC)   . HCAP (healthcare-associated pneumonia) 01/11/2015  . Fever   . Hypoxia   . AKI (acute kidney injury) (HCC)   . Lactic acidosis   . ARDS (adult respiratory distress syndrome) (HCC)   . Respiratory failure (HCC)   . Lumbar foraminal stenosis 04/19/2013  . Acetabulum fracture, right (HCC) 04/09/2013  . Type 2 diabetes mellitus (HCC) 04/09/2013  . ALCOHOLIC HEPATITIS 06/12/2010  . CIRRHOSIS, ALCOHOLIC 06/12/2010  . JAUNDICE 05/01/2010  . LIVER FUNCTION TESTS, ABNORMAL, HX OF 10/02/2009  . PANCREATITIS, HX OF 09/27/2009  . HYPOGONADISM 08/29/2009  . VITAMIN D DEFICIENCY 08/29/2009  . OSTEOPOROSIS 02/22/2009  . HYPERTENSION 02/08/2009  . GERD 02/08/2009  . BACK PAIN, LUMBAR, CHRONIC 02/08/2009  . FATIGUE 02/08/2009  . LEG EDEMA, LEFT 02/08/2009  . Hepatomegaly 02/08/2009    Past Surgical History:  Procedure Laterality Date  . BACK SURGERY    . CHOLECYSTECTOMY    . HERNIA REPAIR    . KYPHOPLASTY N/A 07/16/2015   Procedure: Lumbar three kyphoplasty;  Surgeon: Coletta Memos, MD;  Location: [facility];  Service: Neurosurgery;  Laterality: N/A;  Lumbar three kyphoplasty  . LAMINECTOMY    . STERNUM FRACTURE SURGERY         Home Medications    Prior to Admission medications   Medication Sig Start Date End Date Taking? Authorizing Provider  albuterol (PROVENTIL HFA;VENTOLIN HFA) 108 (  90 BASE) MCG/ACT inhaler Inhale 1 puff into the lungs every 6 (six) hours as needed for wheezing or shortness of breath.   Yes Historical Provider, MD  Calcium Carb-Cholecalciferol (CALCIUM-VITAMIN D) 600-400 MG-UNIT TABS Take 1 tablet by mouth daily.   Yes Historical Provider, MD  cyanocobalamin 1000 MCG tablet Take 100 mcg by mouth daily.   Yes Historical Provider, MD  ergocalciferol (VITAMIN D2) 50000 UNITS capsule Take 50,000 Units by mouth once a week. On Saturday   Yes Historical Provider, MD  gabapentin (NEURONTIN) 400 MG capsule Take 1,200 mg by  mouth 3 (three) times daily.   Yes Historical Provider, MD  insulin aspart (NOVOLOG FLEXPEN) 100 UNIT/ML FlexPen 5-7 units depending on carb intake Patient taking differently: Inject 5-7 Units into the skin See admin instructions. 5-7 units depending on carb intake 03/09/15  Yes Christy A Hawks, FNP  Insulin Glargine (LANTUS SOLOSTAR) 100 UNIT/ML Solostar Pen Inject 37 Units into the skin daily. 08/22/16  Yes Junie Spencer, FNP  morphine (MS CONTIN) 15 MG 12 hr tablet Take 1 tablet (15 mg total) by mouth 3 (three) times daily. 07/19/15  Yes Trixie Dredge, PA-C  oxyCODONE (OXY IR/ROXICODONE) 5 MG immediate release tablet Take 1 tablet (5 mg total) by mouth every 4 (four) hours as needed for moderate pain, severe pain or breakthrough pain. 07/19/15  Yes Trixie Dredge, PA-C  pantoprazole (PROTONIX) 20 MG tablet Take 20 mg by mouth 2 (two) times daily.    Yes Historical Provider, MD  Potassium Chloride ER 20 MEQ TBCR Take 20 mEq by mouth daily. 08/28/16  Yes Elige Radon Dettinger, MD  Teriparatide, Recombinant, (FORTEO) 600 MCG/2.4ML SOLN Inject 20 mcg into the skin daily.   Yes Historical Provider, MD  doxycycline (VIBRA-TABS) 100 MG tablet Take 1 tablet (100 mg total) by mouth 2 (two) times daily. Patient not taking: Reported on 11/28/2016 03/14/16   Junie Spencer, FNP  methocarbamol (ROBAXIN) 500 MG tablet Take 1 Tablet by mouth 4 times a day Patient not taking: Reported on 11/28/2016 05/11/16   Junie Spencer, FNP    Family History Family History  Problem Relation Age of Onset  . Cancer      breast/grandmother, prostate/grandfather    Social History Social History  Substance Use Topics  . Smoking status: Former Smoker    Packs/day: 0.50    Years: 26.00    Types: Cigarettes    Quit date: 08/26/2007  . Smokeless tobacco: Never Used  . Alcohol use No     Allergies   Temsirolimus and Ketorolac tromethamine   Review of Systems Review of Systems  Constitutional: Negative for chills, fever and  weight loss.  HENT: Negative for ear pain and sore throat.   Eyes: Negative for pain and visual disturbance.  Respiratory: Negative for cough and shortness of breath.   Cardiovascular: Negative for chest pain and palpitations.  Gastrointestinal: Negative for abdominal pain, bowel incontinence and vomiting.  Genitourinary: Negative for bladder incontinence, dysuria and hematuria.  Musculoskeletal: Positive for back pain, gait problem and myalgias. Negative for arthralgias.  Skin: Negative for color change and rash.  Neurological: Positive for numbness (Pt reports baseline leg numbness which is at baseline). Negative for seizures, syncope, headaches and paresthesias.  All other systems reviewed and are negative.    Physical Exam Updated Vital Signs BP (!) 86/52   Pulse 93   Temp 98.5 F (36.9 C) (Oral)   Resp 17   SpO2 (!) 88%   Physical Exam  Constitutional: He appears well-developed and well-nourished. He appears distressed.  HENT:  Head: Normocephalic and atraumatic.  Eyes: Conjunctivae are normal.  Neck: Neck supple.  Cardiovascular: Normal rate and regular rhythm.   No murmur heard. Pulmonary/Chest: Effort normal and breath sounds normal. No respiratory distress.  Abdominal: Soft. There is no tenderness.  Musculoskeletal: He exhibits no edema.       Lumbar back: He exhibits tenderness, bony tenderness and pain. He exhibits no swelling, no edema and no deformity.  Neurological: He is alert. A sensory deficit (Pt reports decreased sensation in bilateral lower extremties which is at his baseline) is present. No cranial nerve deficit. GCS eye subscore is 4. GCS verbal subscore is 5. GCS motor subscore is 6.  Decreased strength in bilateral lower extremities likely 2/2 pain  Skin: Skin is warm and dry.  Psychiatric: He has a normal mood and affect.  Nursing note and vitals reviewed.    ED Treatments / Results  Labs (all labs ordered are listed, but only abnormal results are  displayed) Labs Reviewed  CBG MONITORING, ED - Abnormal; Notable for the following:       Result Value   Glucose-Capillary 27 (*)    All other components within normal limits  CBG MONITORING, ED - Abnormal; Notable for the following:    Glucose-Capillary 101 (*)    All other components within normal limits  CBC WITH DIFFERENTIAL/PLATELET  BASIC METABOLIC PANEL  CBG MONITORING, ED    EKG  EKG Interpretation None       Radiology Mr Lumbar Spine Wo Contrast  Result Date: 11/28/2016 CLINICAL DATA:  56 y/o M; lower back pain with right hip/ leg pain after falling today. EXAM: MRI LUMBAR SPINE WITHOUT CONTRAST TECHNIQUE: Multiplanar, multisequence MR imaging of the lumbar spine was performed. No intravenous contrast was administered. COMPARISON:  08/16/2015 lumbar radiographs.  07/07/2015 lumbar MRI. FINDINGS: Segmentation: Numbering of lumbar vertebral bodies same as prior lumbar MRI with transitional S1 and S1-2 disc. Alignment:  Physiologic. Vertebrae: Superior endplate deformity of T11 with mild loss of height and mild diffuse loss of height at L5 vertebral body. Progressive interval moderate loss of height of the T12, L2, L3, and L4 vertebral bodies. Superior compression deformity of L1 vertebral body with severe anterior loss of height. There is edema present within the L2 vertebral body centered around the inferior endplate indicating recent injury. There is no evidence for discitis or suspicious osseous lesion. There is low signal within the L3 vertebral body compatible with kyphoplasty changes. Conus medullaris: Extends to the L2 level and appears normal. Paraspinal and other soft tissues: Negative. Disc levels: L1-2: No significant disc displacement, foraminal narrowing, or canal stenosis. L2-3: Small disc bulge and mild facet hypertrophy. No significant foraminal narrowing. Posterior to the C2 vertebral body there is a oblong extradural structure measuring 3 x 9 x 13 mm (AP x ML x CC  series 6, image 12 and series 7, image 7) which may represent a small epidural hematoma associated with the fracture or sequestered disc fragment. The structures effaces the ventral thecal sac and results in mild canal stenosis. L3-4: Small disc bulge with mild facet and ligamentum flavum hypertrophy greater on the left. Mild bilateral recess narrowing. Mild left foraminal narrowing. Mild canal stenosis. L4-5: Small to moderate disc bulge with mild facet and ligamentum flavum hypertrophy. Mild bilateral recess and foraminal narrowing. Mild canal stenosis. L5-S1: Moderate disc bulge with right foraminal marginal osteophytes and annular fissure combined with moderate facet hypertrophy. Moderate  to severe right and mild left foraminal narrowing. No significant canal stenosis. IMPRESSION: 1. Acute L2 vertebral body inferior endplate fracture with moderate loss of height of the vertebral body. 2. Oblong epidural structure posterior to the L2 vertebral body may represent a small epidural hematoma associated with the fracture or sequestered disc fragment. The structure effaces the ventral thecal sac and results in mild canal stenosis. 3. Interval progressive loss of height of the T11, T12, L1, L3, L4, and L5 vertebral bodies without edema indicating chronic fractures. 4. Lumbar spondylosis with multiple levels of mild canal stenosis and mild foraminal narrowing. 5. Facet and disc disease results in moderate to severe right-sided foraminal narrowing at L5-S1. Electronically Signed   By: Mitzi Hansen M.D.   On: 11/28/2016 23:32    Procedures Procedures (including critical care time)  Medications Ordered in ED Medications  HYDROmorphone (DILAUDID) injection 1 mg (1 mg Intravenous Given 11/28/16 1927)  ondansetron (ZOFRAN) injection 4 mg (4 mg Intravenous Given 11/28/16 1927)  HYDROmorphone (DILAUDID) injection 1 mg (1 mg Intravenous Given 11/28/16 2104)  dextrose 50 % solution 50 mL (50 mLs Intravenous Given  11/28/16 2204)     Initial Impression / Assessment and Plan / ED Course  I have reviewed the triage vital signs and the nursing notes.  Pertinent labs & imaging results that were available during my care of the patient were reviewed by me and considered in my medical decision making (see chart for details).     56 year old male with history of chronic low back pain on multiple home narcotic regimens presents in the setting of fall with worsening back pain. Patient reports he was backing up today when he his heels caught on the concrete and he fell backwards onto his back. Patient had immediate low back pain and was unable to get up for 20-30 minutes. Patient was assisted up by wife but unable to a delay due to significant pain presented for further evaluation. Patient reports he has chronic numbness to bilateral lower x-rays at baseline and right-sided sciatica. He reports all this is worse not improving to some medication. Patient denies fecal or urinary incontinence or urinary retention. Patient denies anesthesia.  On arrival patient was hemodynamically stable and afebrile. On examination patient had minimal sensation and motion me but it has sensation intact and groin. Patient with significant tenderness to palpation of lower back. No obvious step-offs or deformities. Patient given IV pain medication early in course an MRI of lumbar spine obtained. MRI revealed Acute L2 fracture with small epidural hematoma. On reassessment neurologic exam unchanged and pain improving. Neurosurgery consult in setting this finding. At this time neurosurgery does not believe patient requires emergent operative intervention and patient would be safe for brace and pain management. Patient did have hypoglycemia in emergency department which resolved with glucose.  Patient unable to ambulate at this time due to significant pain and believe patient will require admission for further pain management. Neurosurgery stated they  will see patient in the morning on hospitalist service. Basic labs were obtained and no significant abnormalities noted. Patient stable at time of transfer of care.  Attending has seen and evaluated patient and Dr. Eudelia Bunch is in agreement with plan.  Final Clinical Impressions(s) / ED Diagnoses   Final diagnoses:  Lumbar back pain  Fall, initial encounter  Closed fracture of second lumbar vertebra, unspecified fracture morphology, initial encounter [facility])    New Prescriptions New Prescriptions   No medications on file     Greig Castilla  Lonia Mad, MD 11/29/16 [phone]

## 2016-11-28 NOTE — ED Notes (Addendum)
Pt brought back from MRI at this time.  

## 2016-11-28 NOTE — ED Notes (Signed)
Patient returned from MRI.

## 2016-11-29 ENCOUNTER — Encounter (HOSPITAL_COMMUNITY): Payer: Self-pay | Admitting: Emergency Medicine

## 2016-11-29 DIAGNOSIS — E11649 Type 2 diabetes mellitus with hypoglycemia without coma: Secondary | ICD-10-CM | POA: Diagnosis present

## 2016-11-29 DIAGNOSIS — Z79899 Other long term (current) drug therapy: Secondary | ICD-10-CM | POA: Diagnosis not present

## 2016-11-29 DIAGNOSIS — Z79891 Long term (current) use of opiate analgesic: Secondary | ICD-10-CM | POA: Diagnosis not present

## 2016-11-29 DIAGNOSIS — M545 Low back pain: Secondary | ICD-10-CM | POA: Diagnosis not present

## 2016-11-29 DIAGNOSIS — S32029A Unspecified fracture of second lumbar vertebra, initial encounter for closed fracture: Secondary | ICD-10-CM | POA: Diagnosis not present

## 2016-11-29 DIAGNOSIS — D696 Thrombocytopenia, unspecified: Secondary | ICD-10-CM | POA: Diagnosis present

## 2016-11-29 DIAGNOSIS — W19XXXA Unspecified fall, initial encounter: Secondary | ICD-10-CM | POA: Diagnosis not present

## 2016-11-29 DIAGNOSIS — R0902 Hypoxemia: Secondary | ICD-10-CM | POA: Diagnosis not present

## 2016-11-29 DIAGNOSIS — Z794 Long term (current) use of insulin: Secondary | ICD-10-CM | POA: Diagnosis not present

## 2016-11-29 DIAGNOSIS — E1142 Type 2 diabetes mellitus with diabetic polyneuropathy: Secondary | ICD-10-CM | POA: Diagnosis not present

## 2016-11-29 DIAGNOSIS — K219 Gastro-esophageal reflux disease without esophagitis: Secondary | ICD-10-CM | POA: Diagnosis present

## 2016-11-29 DIAGNOSIS — I10 Essential (primary) hypertension: Secondary | ICD-10-CM | POA: Diagnosis present

## 2016-11-29 DIAGNOSIS — M810 Age-related osteoporosis without current pathological fracture: Secondary | ICD-10-CM | POA: Diagnosis present

## 2016-11-29 DIAGNOSIS — Z87891 Personal history of nicotine dependence: Secondary | ICD-10-CM | POA: Diagnosis not present

## 2016-11-29 DIAGNOSIS — W010XXA Fall on same level from slipping, tripping and stumbling without subsequent striking against object, initial encounter: Secondary | ICD-10-CM | POA: Diagnosis present

## 2016-11-29 DIAGNOSIS — M5416 Radiculopathy, lumbar region: Secondary | ICD-10-CM | POA: Diagnosis present

## 2016-11-29 DIAGNOSIS — Y92230 Patient room in hospital as the place of occurrence of the external cause: Secondary | ICD-10-CM | POA: Diagnosis not present

## 2016-11-29 DIAGNOSIS — S32028A Other fracture of second lumbar vertebra, initial encounter for closed fracture: Secondary | ICD-10-CM | POA: Diagnosis present

## 2016-11-29 DIAGNOSIS — T402X5A Adverse effect of other opioids, initial encounter: Secondary | ICD-10-CM | POA: Diagnosis not present

## 2016-11-29 DIAGNOSIS — M5440 Lumbago with sciatica, unspecified side: Secondary | ICD-10-CM | POA: Diagnosis not present

## 2016-11-29 DIAGNOSIS — G8929 Other chronic pain: Secondary | ICD-10-CM | POA: Diagnosis present

## 2016-11-29 DIAGNOSIS — S32020S Wedge compression fracture of second lumbar vertebra, sequela: Secondary | ICD-10-CM

## 2016-11-29 DIAGNOSIS — G4730 Sleep apnea, unspecified: Secondary | ICD-10-CM | POA: Diagnosis present

## 2016-11-29 DIAGNOSIS — S32010A Wedge compression fracture of first lumbar vertebra, initial encounter for closed fracture: Secondary | ICD-10-CM | POA: Diagnosis not present

## 2016-11-29 DIAGNOSIS — R400 Somnolence: Secondary | ICD-10-CM | POA: Diagnosis not present

## 2016-11-29 LAB — CBC WITH DIFFERENTIAL/PLATELET
BASOS ABS: 0 10*3/uL (ref 0.0–0.1)
BASOS PCT: 0 %
Eosinophils Absolute: 0.1 10*3/uL (ref 0.0–0.7)
Eosinophils Relative: 2 %
HEMATOCRIT: 37.7 % — AB (ref 39.0–52.0)
HEMOGLOBIN: 12.2 g/dL — AB (ref 13.0–17.0)
Lymphocytes Relative: 28 %
Lymphs Abs: 1.9 10*3/uL (ref 0.7–4.0)
MCH: 33 pg (ref 26.0–34.0)
MCHC: 32.4 g/dL (ref 30.0–36.0)
MCV: 101.9 fL — ABNORMAL HIGH (ref 78.0–100.0)
MONOS PCT: 8 %
Monocytes Absolute: 0.5 10*3/uL (ref 0.1–1.0)
NEUTROS ABS: 4 10*3/uL (ref 1.7–7.7)
NEUTROS PCT: 61 %
Platelets: 105 10*3/uL — ABNORMAL LOW (ref 150–400)
RBC: 3.7 MIL/uL — AB (ref 4.22–5.81)
RDW: 14.2 % (ref 11.5–15.5)
WBC: 6.6 10*3/uL (ref 4.0–10.5)

## 2016-11-29 LAB — BASIC METABOLIC PANEL
ANION GAP: 10 (ref 5–15)
BUN: 12 mg/dL (ref 6–20)
CHLORIDE: 106 mmol/L (ref 101–111)
CO2: 24 mmol/L (ref 22–32)
Calcium: 8.4 mg/dL — ABNORMAL LOW (ref 8.9–10.3)
Creatinine, Ser: 0.68 mg/dL (ref 0.61–1.24)
GFR calc non Af Amer: >60 mL/min (ref >60)
Glucose, Bld: 77 mg/dL (ref 65–99)
POTASSIUM: 4.1 mmol/L (ref 3.5–5.1)
Sodium: 140 mmol/L (ref 135–145)

## 2016-11-29 LAB — GLUCOSE, CAPILLARY
GLUCOSE-CAPILLARY: 64 mg/dL — AB (ref 65–99)
GLUCOSE-CAPILLARY: 80 mg/dL (ref 65–99)
GLUCOSE-CAPILLARY: 129 mg/dL — AB (ref 65–99)
Glucose-Capillary: 159 mg/dL — ABNORMAL HIGH (ref 65–99)
Glucose-Capillary: 171 mg/dL — ABNORMAL HIGH (ref 65–99)
Glucose-Capillary: 123 mg/dL — ABNORMAL HIGH (ref 65–99)
Glucose-Capillary: 37 mg/dL — CL (ref 65–99)

## 2016-11-29 LAB — CBG MONITORING, ED
GLUCOSE-CAPILLARY: 69 mg/dL (ref 65–99)
Glucose-Capillary: 55 mg/dL — ABNORMAL LOW (ref 65–99)

## 2016-11-29 LAB — HIV ANTIBODY (ROUTINE TESTING W REFLEX): HIV Screen 4th Generation wRfx: NONREACTIVE

## 2016-11-29 MED ORDER — POLYETHYLENE GLYCOL 3350 17 G PO PACK
17.0000 g | PACK | Freq: Every day | ORAL | Status: DC
Start: 2016-11-29 — End: 2016-12-01
  Administered 2016-11-29: 17 g via ORAL
  Filled 2016-11-29 (×3): qty 1

## 2016-11-29 MED ORDER — DIPHENHYDRAMINE HCL 50 MG/ML IJ SOLN: 12.5000 mg | Freq: Four times a day (QID) | INTRAMUSCULAR | Status: DC | PRN

## 2016-11-29 MED ORDER — INSULIN ASPART 100 UNIT/ML ~~LOC~~ SOLN: 4.0000 [IU] | Freq: Three times a day (TID) | SUBCUTANEOUS | Status: DC

## 2016-11-29 MED ORDER — MORPHINE SULFATE ER 15 MG PO TBCR
15.0000 mg | EXTENDED_RELEASE_TABLET | Freq: Two times a day (BID) | ORAL | Status: DC
  Administered 2016-11-29 – 2016-12-01 (×4): 15 mg via ORAL
  Filled 2016-11-29 (×4): qty 1

## 2016-11-29 MED ORDER — HYDROMORPHONE HCL 1 MG/ML IJ SOLN
1.0000 mg | Freq: Once | INTRAMUSCULAR | Status: AC
  Administered 2016-11-29: 1 mg via INTRAVENOUS
  Filled 2016-11-29: qty 1

## 2016-11-29 MED ORDER — NALOXONE HCL 0.4 MG/ML IJ SOLN: 0.4000 mg | INTRAMUSCULAR | Status: DC | PRN

## 2016-11-29 MED ORDER — POTASSIUM CHLORIDE CRYS ER 20 MEQ PO TBCR
20.0000 meq | EXTENDED_RELEASE_TABLET | Freq: Every day | ORAL | Status: DC
  Administered 2016-11-30 – 2016-12-01 (×2): 20 meq via ORAL
  Filled 2016-11-29 (×3): qty 1

## 2016-11-29 MED ORDER — ONDANSETRON HCL 4 MG/2ML IJ SOLN: 4.0000 mg | Freq: Four times a day (QID) | INTRAMUSCULAR | Status: DC | PRN

## 2016-11-29 MED ORDER — GABAPENTIN 400 MG PO CAPS
1200.0000 mg | ORAL_CAPSULE | Freq: Three times a day (TID) | ORAL | Status: DC
  Administered 2016-11-29 – 2016-12-01 (×8): 1200 mg via ORAL
  Filled 2016-11-29 (×8): qty 3

## 2016-11-29 MED ORDER — TERIPARATIDE (RECOMBINANT) 600 MCG/2.4ML ~~LOC~~ SOLN
20.0000 ug | Freq: Every day | SUBCUTANEOUS | Status: DC
  Administered 2016-11-29 – 2016-12-01 (×3): 20 ug via SUBCUTANEOUS
  Filled 2016-11-29: qty 0.08

## 2016-11-29 MED ORDER — MORPHINE SULFATE ER 15 MG PO TBCR
15.0000 mg | EXTENDED_RELEASE_TABLET | Freq: Three times a day (TID) | ORAL | Status: DC
  Administered 2016-11-29: 15 mg via ORAL
  Filled 2016-11-29: qty 1

## 2016-11-29 MED ORDER — HYDROMORPHONE HCL 1 MG/ML IJ SOLN
1.0000 mg | INTRAMUSCULAR | Status: DC | PRN
  Administered 2016-11-29 – 2016-12-01 (×15): 1 mg via INTRAVENOUS
  Filled 2016-11-29 (×16): qty 1

## 2016-11-29 MED ORDER — DIPHENHYDRAMINE HCL 12.5 MG/5ML PO ELIX: 12.5000 mg | ORAL_SOLUTION | Freq: Four times a day (QID) | ORAL | Status: DC | PRN

## 2016-11-29 MED ORDER — HYDROMORPHONE 1 MG/ML IV SOLN
INTRAVENOUS | Status: DC
  Administered 2016-11-29: 1 mg via INTRAVENOUS
  Administered 2016-11-29: 8.1 mg via INTRAVENOUS
  Filled 2016-11-29: qty 25

## 2016-11-29 MED ORDER — INSULIN ASPART 100 UNIT/ML ~~LOC~~ SOLN
0.0000 [IU] | Freq: Three times a day (TID) | SUBCUTANEOUS | Status: DC
  Administered 2016-11-29 – 2016-11-30 (×3): 3 [IU] via SUBCUTANEOUS
  Administered 2016-11-30: 5 [IU] via SUBCUTANEOUS
  Administered 2016-12-01: 11 [IU] via SUBCUTANEOUS
  Administered 2016-12-01: 8 [IU] via SUBCUTANEOUS

## 2016-11-29 MED ORDER — SODIUM CHLORIDE 0.9% FLUSH: 9.0000 mL | INTRAVENOUS | Status: DC | PRN

## 2016-11-29 MED ORDER — GUAIFENESIN ER 600 MG PO TB12
600.0000 mg | ORAL_TABLET | Freq: Two times a day (BID) | ORAL | Status: DC
  Administered 2016-11-29 – 2016-12-01 (×5): 600 mg via ORAL
  Filled 2016-11-29 (×5): qty 1

## 2016-11-29 MED ORDER — SODIUM CHLORIDE 0.9 % IV BOLUS (SEPSIS)
1000.0000 mL | Freq: Once | INTRAVENOUS | Status: AC
Start: 2016-11-29 — End: 2016-11-29
  Administered 2016-11-28: 1000 mL via INTRAVENOUS

## 2016-11-29 MED ORDER — ALBUTEROL SULFATE (2.5 MG/3ML) 0.083% IN NEBU: 3.0000 mL | INHALATION_SOLUTION | Freq: Four times a day (QID) | RESPIRATORY_TRACT | Status: DC | PRN

## 2016-11-29 MED ORDER — INSULIN GLARGINE 100 UNIT/ML ~~LOC~~ SOLN
25.0000 [IU] | Freq: Every day | SUBCUTANEOUS | Status: DC
  Administered 2016-11-29: 25 [IU] via SUBCUTANEOUS
  Filled 2016-11-29: qty 0.25

## 2016-11-29 MED ORDER — PANTOPRAZOLE SODIUM 20 MG PO TBEC
20.0000 mg | DELAYED_RELEASE_TABLET | Freq: Two times a day (BID) | ORAL | Status: DC
  Administered 2016-11-29 – 2016-12-01 (×5): 20 mg via ORAL
  Filled 2016-11-29 (×5): qty 1

## 2016-11-29 MED ORDER — HYDROMORPHONE HCL 1 MG/ML IJ SOLN
0.5000 mg | INTRAMUSCULAR | Status: DC | PRN
  Administered 2016-11-29: 0.5 mg via INTRAVENOUS
  Filled 2016-11-29 (×2): qty 1

## 2016-11-29 MED ORDER — OXYCODONE-ACETAMINOPHEN 5-325 MG PO TABS
1.0000 | ORAL_TABLET | Freq: Four times a day (QID) | ORAL | Status: DC | PRN
  Administered 2016-11-29 – 2016-12-01 (×6): 1 via ORAL
  Filled 2016-11-29 (×6): qty 1

## 2016-11-29 NOTE — Consult Note (Signed)
CC:  Chief Complaint  Patient presents with  . Back Pain    HPI: [name] is a 56 y.o. male who presented to ER after sustaining mechanical fall. Reports landed on buttocks and felt spine "crunch". Developed excruciating, 10/10, throbbing low back pain after the fall. Was unable to get up without assistance due to pain. Pain is exacerbated by movement, bending, twisting. History of chronic low back pain with radiculopathy affected right side. He reports this is at baseline and there has been no worsening since the fall. Denies bowel or bladder dysfunction. He is admitted under [facility] for pain management.  PMH: Past Medical History:  Diagnosis Date  . Chronic lower back pain   . Compression fracture    lumbar 3  . Diabetes mellitus   . Fracture acetabulum-closed (HCC) 04/09/2013  . GERD (gastroesophageal reflux disease)   . H/O Legionnaire's disease   . Hepatomegaly   . Hypertension   . Migraine   . Osteoporosis   . Pancreatitis   . PTSD (post-traumatic stress disorder)   . Sleep apnea    does not wear CPAP  . Vitamin D deficiency     PSH: Past Surgical History:  Procedure Laterality Date  . BACK SURGERY    . CHOLECYSTECTOMY    . HERNIA REPAIR    . KYPHOPLASTY N/A 07/16/2015   Procedure: Lumbar three kyphoplasty;  Surgeon: Coletta Memos, MD;  Location: [facility];  Service: Neurosurgery;  Laterality: N/A;  Lumbar three kyphoplasty  . LAMINECTOMY    . STERNUM FRACTURE SURGERY      SH: Social History  Substance Use Topics  . Smoking status: Former Smoker    Packs/day: 0.50    Years: 26.00    Types: Cigarettes    Quit date: 08/26/2007  . Smokeless tobacco: Never Used  . Alcohol use No    MEDS: Prior to Admission medications   Medication Sig Start Date End Date Taking? Authorizing Provider  albuterol (PROVENTIL HFA;VENTOLIN HFA) 108 (90 BASE) MCG/ACT inhaler Inhale 1 puff into the lungs every 6 (six) hours as needed for wheezing or shortness of breath.   Yes  Historical Provider, MD  Calcium Carb-Cholecalciferol (CALCIUM-VITAMIN D) 600-400 MG-UNIT TABS Take 1 tablet by mouth daily.   Yes Historical Provider, MD  cyanocobalamin 1000 MCG tablet Take 100 mcg by mouth daily.   Yes Historical Provider, MD  ergocalciferol (VITAMIN D2) 50000 UNITS capsule Take 50,000 Units by mouth once a week. On Saturday   Yes Historical Provider, MD  gabapentin (NEURONTIN) 400 MG capsule Take 1,200 mg by mouth 3 (three) times daily.   Yes Historical Provider, MD  insulin aspart (NOVOLOG FLEXPEN) 100 UNIT/ML FlexPen 5-7 units depending on carb intake Patient taking differently: Inject 5-7 Units into the skin See admin instructions. 5-7 units depending on carb intake 03/09/15  Yes Christy A Hawks, FNP  Insulin Glargine (LANTUS SOLOSTAR) 100 UNIT/ML Solostar Pen Inject 37 Units into the skin daily. 08/22/16  Yes Junie Spencer, FNP  morphine (MS CONTIN) 15 MG 12 hr tablet Take 1 tablet (15 mg total) by mouth 3 (three) times daily. 07/19/15  Yes Trixie Dredge, PA-C  oxyCODONE (OXY IR/ROXICODONE) 5 MG immediate release tablet Take 1 tablet (5 mg total) by mouth every 4 (four) hours as needed for moderate pain, severe pain or breakthrough pain. 07/19/15  Yes Trixie Dredge, PA-C  pantoprazole (PROTONIX) 20 MG tablet Take 20 mg by mouth 2 (two) times daily.    Yes Historical Provider, MD  Potassium Chloride ER 20 MEQ TBCR Take 20 mEq by mouth daily. 08/28/16  Yes Elige Radon Dettinger, MD  Teriparatide, Recombinant, (FORTEO) 600 MCG/2.4ML SOLN Inject 20 mcg into the skin daily.   Yes Historical Provider, MD    ALLERGY: Allergies  Allergen Reactions  . Temsirolimus Swelling  . Ketorolac Tromethamine     Hives (toradol)    ROS: Review of Systems  Constitutional: Negative.   HENT: Negative.   Eyes: Negative.   Cardiovascular: Negative.   Gastrointestinal: Negative.   Genitourinary: Negative.   Musculoskeletal: Positive for back pain and myalgias. Negative for neck pain.  Skin:  Negative.   Neurological: Positive for tingling (chronic down RLE to foot) and sensory change (chronic RLE - no acute worsening). Negative for dizziness, tremors, speech change, focal weakness, seizures, loss of consciousness and headaches.    Vitals:   11/29/16 0521 11/29/16 0625  BP: 132/81 (!) 130/92  Pulse: 85 80  Resp: (!) 21 18  Temp: 98.5 F (36.9 C) 98.2 F (36.8 C)   General appearance: WDWN, uncomfortable on stretcher Eyes: PERRL Cardiovascular: Regular rate and rhythm without murmurs, rubs, gallops. No edema or variciosities. Distal pulses normal. Pulmonary: Clear to auscultation Musculoskeletal:     Muscle tone upper extremities: Normal    Muscle tone lower extremities: Normal    Motor exam: Upper Extremities Deltoid Bicep Tricep Grip  Right 5/5 5/5 5/5 5/5  Left 5/5 5/5 5/5 5/5   Lower extremities: moves lower extremities well but strength limited to pain  Neurological Awake, alert, oriented Memory and concentration grossly intact Speech fluent, appropriate CNII: Visual fields normal CNIII/IV/VI: EOMI CNV: Facial sensation normal CNVII: Symmetric, normal strength CNVIII: Grossly normal CNIX: Normal palate movement CNXI: Trap and SCM strength normal CN XII: Tongue protrusion normal Sensation grossly intact to LT  IMAGING: MRI Lumbar spine 1. Acute L2 vertebral body inferior endplate fracture with moderate loss of height of the vertebral body. 2. Oblong epidural structure posterior to the L2 vertebral body may represent a small epidural hematoma associated with the fracture or sequestered disc fragment. The structure effaces the ventral thecal sac and results in mild canal stenosis. 3. Interval progressive loss of height of the T11, T12, L1, L3, L4, and L5 vertebral bodies without edema indicating chronic fractures. 4. Lumbar spondylosis with multiple levels of mild canal stenosis and mild foraminal narrowing. 5. Facet and disc disease results in  moderate to severe right-sided foraminal narrowing at L5-S1.  IMPRESSION/PLAN - 56 y.o. male with L2 vertebral body fracture s/p mechanical fall. He does have a history of chronic low back pain with radiculopathy that he reports has not been affected since his injury. He appears neuro intact, with the exception of pain being a limiting factor. At this time, no neurosurgical intervention seems necessary - this was discussed with Dr Bevely Palmer. Rec continuing with pain control. He will need a TLSO brace to be worn any time sitting up/up ambulating. F/U 4 weeks at [facility] for repeat Xray. Rec referral to endocrine due to multiple compression deformities. Will sign off. Call for any concerns.

## 2016-11-29 NOTE — Progress Notes (Addendum)
Hypoglycemic Event  CBG: 37  Treatment: 15 GM carbohydrate snack  Symptoms: Pale  Follow-up CBG: Time: 1218 CBG Result: 123  Possible Reasons for Event: Inadequate meal intake  Comments/MD notified:Dr. Ronalee Belts     [name]

## 2016-11-29 NOTE — ED Notes (Addendum)
Sandwich and apple juice given to patient for CBG of 55.

## 2016-11-29 NOTE — ED Notes (Signed)
Paged [employer] for [employer]

## 2016-11-29 NOTE — ED Notes (Signed)
[name] rep) will come in the morning around 9-930AM to place TLSO.

## 2016-11-29 NOTE — Progress Notes (Signed)
PROGRESS NOTE    [name]  WUJ:[id number] DOB: Dec 03, 1960 DOA: 11/28/2016 PCP: Elige Radon Dettinger, MD   Brief Narrative: 56 y.o. male with medical history significant of prior lumbar compression fracture, chronic low back pain.  Patient presents to the ED with c/o acute worsening of back pain and RLE sciatic pain following a mechanical fall off of a ladder at home.   In ED, patient was found to have L2 compression fracture. Neurosurgery was consulted and patient was admitted for further evaluation.  Assessment & Plan:   #  Compression fracture of lumbar vertebra [facility]): -Evaluated by neurosurgery. Patient has history of chronic lower back pain with radiculopathy. No neurosurgical intervention recommended at this time. Neurosurgery recommended TLSO brace and follow-up in 4 weeks at the clinic. -Pain management -Ordered PT OT evaluation is starting from tomorrow -Discussed with the patient and his wife at bedside regarding pain management and importance of rehabilitation.  # Pain management:  -Apparently patient was started on sustained-release morphine every 8 hourly, Dilaudid PCA pump on admission. During my examination this morning, patient was somnolent but was still complaining of pain. He had hypoxia requiring oxygen supplementation to maintain oxygen saturation more than 90%. I was worried about his respiratory status. I discussed with him regarding discontinuing PCA pump and adjusting his pain medications. -I discontinued Dilaudid PCA pump and changed to sustained-release morphine 15 mg twice a day ( from every 8 hour dose), Percocet every 6 hourly as needed, IV Dilaudid as needed every 3 hourly for the pain management. I discussed with the patient, his wife and nursing regarding importance of controlling his pain medicine and at the same time watching his mental status and respiratory status closely.  -I'll continue Neurontin 1200 mg 3 times a day -Added MiraLAX for the bowel  regimen. Patient reported he has no issues with constipation however education provided to the patient regarding the risk of constipation while on opiates.  -Patient was listed as allergy to ketorolac. I spent at least 20 minutes only during discussion regarding further plan, and management, amputation and further care. Patient was unhappy that I have discontinued Dilaudid PCA pump and adjusted the pain medications.  # Type 2 diabetes mellitus (HCC) with hypoglycemia this morning: Patient did not eat this morning when he was on PCA pump and somnolent. He received his regular dose of insulin and had hypoglycemia.  -Patient received oral juice with improvement in blood sugar level. Since patient's mental status is improving now after discontinuation of PCA pump, his mental status is improving and able to eat. I'll continue current insulin regimen with close monitoring of blood sugar level. Discussed with the patient's nurse.  #Somnolent due to opiates: Discussed with the patient as above. Close monitor mental status.  #Thrombocytopenia: Platelet level found to have 105. I noticed that the patient had few episodes of thrombocytopenia in the past. Unknown etiology. Not on anticoagulation. Repeat CBC in the morning.  DVT prophylaxis: SCD. Not on anticoagulation because of thrombocytopenia and patient possibly has a small epidural hematoma associated with the fracture. Code Status: full code Family Communication: I discussed with the patient's wife at bedside Disposition Plan: likely discharge home versus rehabilitation in 1-2 days    Consultants:   Neurosurgery  Procedures: none Antimicrobials: none  Subjective:  Patient was seen and examined at bedside. Patient was somnolent but complaining of back pain. Denied headache, dizziness, nausea, vomiting, chest pain or shortness of breath. Wife at bedside.  Objective: Vitals:   11/29/16  1610 11/29/16 0731 11/29/16 0800 11/29/16 0947  BP: (!)  130/92 119/88    Pulse: 80 82    Resp: Temp: 98.2 F (36.8 C) 98.2 F (36.8 C)    TempSrc:  Oral    SpO2: 93% 91% 94% 92%  Weight:      Height:        Intake/Output Summary (Last 24 hours) at 11/29/16 1351 Last data filed at 11/29/16 1031  Gross per 24 hour  Intake             1120 ml  Output              800 ml  Net              320 ml   Filed Weights   11/29/16 0251  Weight: 83.5 kg (184 lb)    Examination:  General exam: Looks somnolent, asking pain medication while awake but mostly closing his eyes Respiratory system: Clear to auscultation. Respiratory effort normal. No wheezing or crackle Cardiovascular system: S1 & S2 heard, RRR.  No pedal edema. Gastrointestinal system: Abdomen is nondistended, soft and nontender. Normal bowel sounds heard. Central nervous system: Alert and oriented. No focal neurological deficits. Extremities: Able to move lower extremities and sensation intact. Unable to examine strength because of back pain Skin: No rashes, lesions or ulcers Psychiatry:  Somnolent.    Data Reviewed: I have personally reviewed following labs and imaging studies  CBC:  Recent Labs Lab 11/28/16 2351  WBC 6.6  NEUTROABS 4.0  HGB 12.2*  HCT 37.7*  MCV 101.9*  PLT 105*   Basic Metabolic Panel:  Recent Labs Lab 11/28/16 2351  NA 140  K 4.1  CL 106  CO2 24  GLUCOSE 77  BUN 12  CREATININE 0.68  CALCIUM 8.4*   GFR: Estimated Creatinine Clearance: 105.6 mL/min (by C-G formula based on SCr of 0.68 mg/dL). Liver Function Tests: No results for input(s): AST, ALT, ALKPHOS, BILITOT, PROT, ALBUMIN in the last 168 hours. No results for input(s): LIPASE, AMYLASE in the last 168 hours. No results for input(s): AMMONIA in the last 168 hours. Coagulation Profile: No results for input(s): INR, PROTIME in the last 168 hours. Cardiac Enzymes: No results for input(s): CKTOTAL, CKMB, CKMBINDEX, TROPONINI in the last 168 hours. BNP (last 3  results) No results for input(s): PROBNP in the last 8760 hours. HbA1C: No results for input(s): HGBA1C in the last 72 hours. CBG:  Recent Labs Lab 11/29/16 0149 11/29/16 0304 11/29/16 0747 11/29/16 1148 11/29/16 1218  GLUCAP 69 159* 171* 37* 123*   Lipid Profile: No results for input(s): CHOL, HDL, LDLCALC, TRIG, CHOLHDL, LDLDIRECT in the last 72 hours. Thyroid Function Tests: No results for input(s): TSH, T4TOTAL, FREET4, T3FREE, THYROIDAB in the last 72 hours. Anemia Panel: No results for input(s): VITAMINB12, FOLATE, FERRITIN, TIBC, IRON, RETICCTPCT in the last 72 hours. Sepsis Labs: No results for input(s): PROCALCITON, LATICACIDVEN in the last 168 hours.  No results found for this or any previous visit (from the past 240 hour(s)).       Radiology Studies: Mr Lumbar Spine Wo Contrast  Result Date: 11/28/2016 CLINICAL DATA:  56 y/o M; lower back pain with right hip/ leg pain after falling today. EXAM: MRI LUMBAR SPINE WITHOUT CONTRAST TECHNIQUE: Multiplanar, multisequence MR imaging of the lumbar spine was performed. No intravenous contrast was administered. COMPARISON:  08/16/2015 lumbar radiographs.  07/07/2015 lumbar MRI. FINDINGS: Segmentation: Numbering of lumbar vertebral bodies same  as prior lumbar MRI with transitional S1 and S1-2 disc. Alignment:  Physiologic. Vertebrae: Superior endplate deformity of T11 with mild loss of height and mild diffuse loss of height at L5 vertebral body. Progressive interval moderate loss of height of the T12, L2, L3, and L4 vertebral bodies. Superior compression deformity of L1 vertebral body with severe anterior loss of height. There is edema present within the L2 vertebral body centered around the inferior endplate indicating recent injury. There is no evidence for discitis or suspicious osseous lesion. There is low signal within the L3 vertebral body compatible with kyphoplasty changes. Conus medullaris: Extends to the L2 level and appears  normal. Paraspinal and other soft tissues: Negative. Disc levels: L1-2: No significant disc displacement, foraminal narrowing, or canal stenosis. L2-3: Small disc bulge and mild facet hypertrophy. No significant foraminal narrowing. Posterior to the C2 vertebral body there is a oblong extradural structure measuring 3 x 9 x 13 mm (AP x ML x CC series 6, image 12 and series 7, image 7) which may represent a small epidural hematoma associated with the fracture or sequestered disc fragment. The structures effaces the ventral thecal sac and results in mild canal stenosis. L3-4: Small disc bulge with mild facet and ligamentum flavum hypertrophy greater on the left. Mild bilateral recess narrowing. Mild left foraminal narrowing. Mild canal stenosis. L4-5: Small to moderate disc bulge with mild facet and ligamentum flavum hypertrophy. Mild bilateral recess and foraminal narrowing. Mild canal stenosis. L5-S1: Moderate disc bulge with right foraminal marginal osteophytes and annular fissure combined with moderate facet hypertrophy. Moderate to severe right and mild left foraminal narrowing. No significant canal stenosis. IMPRESSION: 1. Acute L2 vertebral body inferior endplate fracture with moderate loss of height of the vertebral body. 2. Oblong epidural structure posterior to the L2 vertebral body may represent a small epidural hematoma associated with the fracture or sequestered disc fragment. The structure effaces the ventral thecal sac and results in mild canal stenosis. 3. Interval progressive loss of height of the T11, T12, L1, L3, L4, and L5 vertebral bodies without edema indicating chronic fractures. 4. Lumbar spondylosis with multiple levels of mild canal stenosis and mild foraminal narrowing. 5. Facet and disc disease results in moderate to severe right-sided foraminal narrowing at L5-S1. Electronically Signed   By: Mitzi Hansen M.D.   On: 11/28/2016 23:32        Scheduled Meds: . gabapentin   1,200 mg Oral TID  . guaiFENesin  600 mg Oral BID  . insulin aspart  0-15 Units Subcutaneous TID WC  . insulin aspart  4 Units Subcutaneous TID WC  . insulin glargine  25 Units Subcutaneous Daily  . morphine  15 mg Oral Q12H  . pantoprazole  20 mg Oral BID  . polyethylene glycol  17 g Oral Daily  . potassium chloride SA  20 mEq Oral Daily  . Teriparatide (Recombinant)  20 mcg Subcutaneous Daily   Continuous Infusions:   LOS: 0 days    Jaeden Messer Jaynie Collins, MD [facility] Pager [phone]  If 7PM-7AM, please contact night-coverage [email or web address] Password [email or web address] 11/29/2016, 1:51 PM

## 2016-11-29 NOTE — H&P (Signed)
History and Physical    [name] JYN:[id number] DOB: [birth year] DOA: 11/28/2016  PCP: Elige Radon Dettinger, MD  Patient coming from: Home  I have personally briefly reviewed patient's old medical records in [facility]  Chief Complaint: Fall back pain  HPI: [name] is a 56 y.o. male with medical history significant of prior lumbar compression fracture, chronic low back pain.  Patient presents to the ED with c/o acute worsening of back pain and RLE sciatic pain following a mechanical fall off of a ladder at home today.  Pain is persistent, severe, radiates down R leg.  Some leg weakness but attributes this to pain.  Pain is 10/10.  Nothing makes it better, certain positions make it worse.  No incontinence or urinary retention, no saddle anesthesia.   ED Course: Acute L2 compression fracture with small amount of blood in canal causing mild canal stenosis.  EDP called and spoke with neurosurgery (I presume Dr. Bevely Palmer as he is on call), and they did not feel that patient required emergent operative management and should be safe for TLSO brace and pain management.  They will see patient in AM.   Review of Systems: As per HPI otherwise 10 point review of systems negative.   Past Medical History:  Diagnosis Date  . Chronic lower back pain   . Compression fracture    lumbar 3  . Diabetes mellitus   . Fracture acetabulum-closed (HCC) 04/09/2013  . GERD (gastroesophageal reflux disease)   . H/O Legionnaire's disease   . Hepatomegaly   . Hypertension   . Migraine   . Osteoporosis   . Pancreatitis   . PTSD (post-traumatic stress disorder)   . Sleep apnea    does not wear CPAP  . Vitamin D deficiency     Past Surgical History:  Procedure Laterality Date  . BACK SURGERY    . CHOLECYSTECTOMY    . HERNIA REPAIR    . KYPHOPLASTY N/A 07/16/2015   Procedure: Lumbar three kyphoplasty;  Surgeon: Coletta Memos, MD;  Location: [facility];  Service: Neurosurgery;  Laterality:  N/A;  Lumbar three kyphoplasty  . LAMINECTOMY    . STERNUM FRACTURE SURGERY       reports that he quit smoking about 9 years ago. His smoking use included Cigarettes. He has a 13.00 pack-year smoking history. He has never used smokeless tobacco. He reports that he does not drink alcohol or use drugs.  Allergies  Allergen Reactions  . Temsirolimus Swelling  . Ketorolac Tromethamine     Hives (toradol)    Family History  Problem Relation Age of Onset  . Cancer      breast/grandmother, prostate/grandfather     Prior to Admission medications   Medication Sig Start Date End Date Taking? Authorizing Provider  albuterol (PROVENTIL HFA;VENTOLIN HFA) 108 (90 BASE) MCG/ACT inhaler Inhale 1 puff into the lungs every 6 (six) hours as needed for wheezing or shortness of breath.   Yes Historical Provider, MD  Calcium Carb-Cholecalciferol (CALCIUM-VITAMIN D) 600-400 MG-UNIT TABS Take 1 tablet by mouth daily.   Yes Historical Provider, MD  cyanocobalamin 1000 MCG tablet Take 100 mcg by mouth daily.   Yes Historical Provider, MD  ergocalciferol (VITAMIN D2) 50000 UNITS capsule Take 50,000 Units by mouth once a week. On Saturday   Yes Historical Provider, MD  gabapentin (NEURONTIN) 400 MG capsule Take 1,200 mg by mouth 3 (three) times daily.   Yes Historical Provider, MD  insulin aspart (NOVOLOG FLEXPEN) 100  UNIT/ML FlexPen 5-7 units depending on carb intake Patient taking differently: Inject 5-7 Units into the skin See admin instructions. 5-7 units depending on carb intake 03/09/15  Yes Christy A Hawks, FNP  Insulin Glargine (LANTUS SOLOSTAR) 100 UNIT/ML Solostar Pen Inject 37 Units into the skin daily. 08/22/16  Yes Junie Spencer, FNP  morphine (MS CONTIN) 15 MG 12 hr tablet Take 1 tablet (15 mg total) by mouth 3 (three) times daily. 07/19/15  Yes Trixie Dredge, PA-C  oxyCODONE (OXY IR/ROXICODONE) 5 MG immediate release tablet Take 1 tablet (5 mg total) by mouth every 4 (four) hours as needed for  moderate pain, severe pain or breakthrough pain. 07/19/15  Yes Trixie Dredge, PA-C  pantoprazole (PROTONIX) 20 MG tablet Take 20 mg by mouth 2 (two) times daily.    Yes Historical Provider, MD  Potassium Chloride ER 20 MEQ TBCR Take 20 mEq by mouth daily. 08/28/16  Yes Elige Radon Dettinger, MD  Teriparatide, Recombinant, (FORTEO) 600 MCG/2.4ML SOLN Inject 20 mcg into the skin daily.   Yes Historical Provider, MD    Physical Exam: Vitals:   11/29/16 0030 11/29/16 0045 11/29/16 0100 11/29/16 0115  BP: 112/73 108/74 (!) 111/98 122/83  Pulse: 93 94 91 92  Resp: 16 (!) Temp:      TempSrc:      SpO2: 90% 92% 90% 91%    Constitutional: writhing in bed in pain, having severe back pain Eyes: PERRL, lids and conjunctivae normal ENMT: Mucous membranes are moist. Posterior pharynx clear of any exudate or lesions.Normal dentition.  Neck: normal, supple, no masses, no thyromegaly Respiratory: clear to auscultation bilaterally, no wheezing, no crackles. Normal respiratory effort. No accessory muscle use.  Cardiovascular: Regular rate and rhythm, no murmurs / rubs / gallops. No extremity edema. 2+ pedal pulses. No carotid bruits.  Abdomen: no tenderness, no masses palpated. No hepatosplenomegaly. Bowel sounds positive.  Musculoskeletal: no clubbing / cyanosis. No joint deformity upper and lower extremities. Good ROM, no contractures. Normal muscle tone.  Skin: no rashes, lesions, ulcers. No induration Neurologic: CN 2-12 grossly intact. Sensation intact, DTR normal. Strength 5/5 in all 4. Pain with movement of RLE. Psychiatric: Normal judgment and insight. Alert and oriented x 3. Normal mood.    Labs on Admission: I have personally reviewed following labs and imaging studies  CBC:  Recent Labs Lab 11/28/16 2351  WBC 6.6  NEUTROABS 4.0  HGB 12.2*  HCT 37.7*  MCV 101.9*  PLT 105*   Basic Metabolic Panel:  Recent Labs Lab 11/28/16 2351  NA 140  K 4.1  CL 106  CO2 24  GLUCOSE 77    BUN 12  CREATININE 0.68  CALCIUM 8.4*   GFR: CrCl cannot be calculated (Unknown ideal weight.). Liver Function Tests: No results for input(s): AST, ALT, ALKPHOS, BILITOT, PROT, ALBUMIN in the last 168 hours. No results for input(s): LIPASE, AMYLASE in the last 168 hours. No results for input(s): AMMONIA in the last 168 hours. Coagulation Profile: No results for input(s): INR, PROTIME in the last 168 hours. Cardiac Enzymes: No results for input(s): CKTOTAL, CKMB, CKMBINDEX, TROPONINI in the last 168 hours. BNP (last 3 results) No results for input(s): PROBNP in the last 8760 hours. HbA1C: No results for input(s): HGBA1C in the last 72 hours. CBG:  Recent Labs Lab 11/28/16 2202 11/28/16 2316 11/29/16 0043  GLUCAP 27* 101* 55*   Lipid Profile: No results for input(s): CHOL, HDL, LDLCALC, TRIG, CHOLHDL, LDLDIRECT in the last 72  hours. Thyroid Function Tests: No results for input(s): TSH, T4TOTAL, FREET4, T3FREE, THYROIDAB in the last 72 hours. Anemia Panel: No results for input(s): VITAMINB12, FOLATE, FERRITIN, TIBC, IRON, RETICCTPCT in the last 72 hours. Urine analysis:    Component Value Date/Time   COLORURINE YELLOW 01/17/2015 1305   APPEARANCEUR CLEAR 01/17/2015 1305   LABSPEC 1.014 01/17/2015 1305   PHURINE 5.0 01/17/2015 1305   GLUCOSEU NEGATIVE 01/17/2015 1305   HGBUR NEGATIVE 01/17/2015 1305   BILIRUBINUR NEGATIVE 01/17/2015 1305   BILIRUBINUR n 02/28/2011 1247   KETONESUR NEGATIVE 01/17/2015 1305   PROTEINUR NEGATIVE 01/17/2015 1305   UROBILINOGEN 1.0 01/17/2015 1305   NITRITE NEGATIVE 01/17/2015 1305   LEUKOCYTESUR NEGATIVE 01/17/2015 1305    Radiological Exams on Admission: Mr Lumbar Spine Wo Contrast  Result Date: 11/28/2016 CLINICAL DATA:  56 y/o M; lower back pain with right hip/ leg pain after falling today. EXAM: MRI LUMBAR SPINE WITHOUT CONTRAST TECHNIQUE: Multiplanar, multisequence MR imaging of the lumbar spine was performed. No intravenous  contrast was administered. COMPARISON:  08/16/2015 lumbar radiographs.  07/07/2015 lumbar MRI. FINDINGS: Segmentation: Numbering of lumbar vertebral bodies same as prior lumbar MRI with transitional S1 and S1-2 disc. Alignment:  Physiologic. Vertebrae: Superior endplate deformity of T11 with mild loss of height and mild diffuse loss of height at L5 vertebral body. Progressive interval moderate loss of height of the T12, L2, L3, and L4 vertebral bodies. Superior compression deformity of L1 vertebral body with severe anterior loss of height. There is edema present within the L2 vertebral body centered around the inferior endplate indicating recent injury. There is no evidence for discitis or suspicious osseous lesion. There is low signal within the L3 vertebral body compatible with kyphoplasty changes. Conus medullaris: Extends to the L2 level and appears normal. Paraspinal and other soft tissues: Negative. Disc levels: L1-2: No significant disc displacement, foraminal narrowing, or canal stenosis. L2-3: Small disc bulge and mild facet hypertrophy. No significant foraminal narrowing. Posterior to the C2 vertebral body there is a oblong extradural structure measuring 3 x 9 x 13 mm (AP x ML x CC series 6, image 12 and series 7, image 7) which may represent a small epidural hematoma associated with the fracture or sequestered disc fragment. The structures effaces the ventral thecal sac and results in mild canal stenosis. L3-4: Small disc bulge with mild facet and ligamentum flavum hypertrophy greater on the left. Mild bilateral recess narrowing. Mild left foraminal narrowing. Mild canal stenosis. L4-5: Small to moderate disc bulge with mild facet and ligamentum flavum hypertrophy. Mild bilateral recess and foraminal narrowing. Mild canal stenosis. L5-S1: Moderate disc bulge with right foraminal marginal osteophytes and annular fissure combined with moderate facet hypertrophy. Moderate to severe right and mild left  foraminal narrowing. No significant canal stenosis. IMPRESSION: 1. Acute L2 vertebral body inferior endplate fracture with moderate loss of height of the vertebral body. 2. Oblong epidural structure posterior to the L2 vertebral body may represent a small epidural hematoma associated with the fracture or sequestered disc fragment. The structure effaces the ventral thecal sac and results in mild canal stenosis. 3. Interval progressive loss of height of the T11, T12, L1, L3, L4, and L5 vertebral bodies without edema indicating chronic fractures. 4. Lumbar spondylosis with multiple levels of mild canal stenosis and mild foraminal narrowing. 5. Facet and disc disease results in moderate to severe right-sided foraminal narrowing at L5-S1. Electronically Signed   By: Mitzi Hansen M.D.   On: 11/28/2016 23:32    EKG:  Independently reviewed.  Assessment/Plan Principal Problem:   Compression fracture of lumbar vertebra (HCC) Active Problems:   Type 2 diabetes mellitus (HCC)    1. Compression fx of L2 with small amount of blood in spine - 1. PCA dilaudid for pain control 2. Continue home ms-contin 3. Tele monitor and continuous pulse ox 4. TLSO brace ordered 5. Neurosurgery consulted by EDP, they will see in AM, they state this is not an emergent surgical issue. 2. DM2 - 1. Lantus 25 units daily 2. Mod scale SSI AC 3. 4 units mealtime  DVT prophylaxis: SCDs due to small amount of blood in spine Code Status: Full Family Communication: Wife at bedside Disposition Plan: Home after admit Consults called: Neurosurgery called by EDP Admission status: Admit to inpatient for PCA   Hillary Bow DO [facility] Pager [phone]  If 7AM-7PM, please contact day team taking care of patient [email or web address] Password [email or web address]  11/29/2016, 1:32 AM

## 2016-11-29 NOTE — Progress Notes (Addendum)
Hypoglycemic Event  CBG: 64  Treatment: 15 GM carbohydrate snack  Symptoms: Pale and Shaky  Follow-up CBG: Time: CBG Result:80  Possible Reasons for Event: Inadequate meal intake  Comments/MD notified: Dr. Ronalee Belts     [name] Consuella Lose

## 2016-11-29 NOTE — ED Notes (Signed)
REpaged [facility] 

## 2016-11-29 NOTE — ED Notes (Signed)
Pt placed on fracture pan to have a bowel movement by this Rn and [employer], and this RN helped pt urinate with urinal.

## 2016-11-29 NOTE — Progress Notes (Signed)
Wasted  of Dilaudid with Lonnie, RN when discontinuing pump.

## 2016-11-30 DIAGNOSIS — S32010A Wedge compression fracture of first lumbar vertebra, initial encounter for closed fracture: Secondary | ICD-10-CM

## 2016-11-30 DIAGNOSIS — W19XXXA Unspecified fall, initial encounter: Secondary | ICD-10-CM

## 2016-11-30 DIAGNOSIS — M5440 Lumbago with sciatica, unspecified side: Secondary | ICD-10-CM

## 2016-11-30 LAB — CBC
HEMATOCRIT: 39.2 % (ref 39.0–52.0)
HEMOGLOBIN: 12.5 g/dL — AB (ref 13.0–17.0)
MCH: 33.1 pg (ref 26.0–34.0)
MCHC: 31.9 g/dL (ref 30.0–36.0)
MCV: 103.7 fL — ABNORMAL HIGH (ref 78.0–100.0)
Platelets: 84 10*3/uL — ABNORMAL LOW (ref 150–400)
RBC: 3.78 MIL/uL — AB (ref 4.22–5.81)
RDW: 13.7 % (ref 11.5–15.5)
WBC: 6.3 10*3/uL (ref 4.0–10.5)

## 2016-11-30 LAB — GLUCOSE, CAPILLARY
GLUCOSE-CAPILLARY: 166 mg/dL — AB (ref 65–99)
GLUCOSE-CAPILLARY: 187 mg/dL — AB (ref 65–99)
GLUCOSE-CAPILLARY: 285 mg/dL — AB (ref 65–99)
Glucose-Capillary: 238 mg/dL — ABNORMAL HIGH (ref 65–99)

## 2016-11-30 MED ORDER — HYDROMORPHONE HCL 1 MG/ML IJ SOLN: 1.0000 mg | Freq: Once | INTRAMUSCULAR | Status: DC

## 2016-11-30 NOTE — Evaluation (Signed)
Physical Therapy Evaluation Patient Details Name: [name] MRN: [id number] DOB: April 23, 1961 Today's Date: 11/30/2016   History of Present Illness  Patient is a 56 yo male admitted 11/28/16 after fall from ladder with L2 compression fracture.  Patient with TLSO to be worn at all times except in bed.    PMH:  prior lumbar compression fracture, chronic back pain, DM, HTN, osteoporosis, PTSD.  Clinical Impression  Patient presents with problems listed below.  Will benefit from acute PT to maximize functional mobility prior to discharge home with wife.  Recommend f/u HHPT at d/c for continued therapy.    Follow Up Recommendations Home health PT;Supervision for mobility/OOB    Equipment Recommendations  None recommended by PT    Recommendations for Other Services       Precautions / Restrictions Precautions Precautions: Back Precaution Comments: Reviewed precautions with patient.  Several back surgeries so familiar but does not always comply. Required Braces or Orthoses: Spinal Brace Spinal Brace: Thoracolumbosacral orthotic;Applied in sitting position (Worn at all times. Do not take off for shower.) Restrictions Weight Bearing Restrictions: No      Mobility  Bed Mobility Overal bed mobility: Needs Assistance Bed Mobility: Rolling;Sidelying to Sit;Sit to Sidelying Rolling: Min guard Sidelying to sit: Min assist     Sit to sidelying: Min assist General bed mobility comments: Verbal cues for log rolling.  Patient using rails to roll with no physical assist.  Increased time due to pain.  Assist to bring trunk to sitting position.  Instructed patient on donning/doffing back brace correctly.   Assist to bring LE's onto bed to return to sidelying > supine.  Patient requiring increased time with all mobility, and yelling out in pain.  Transfers Overall transfer level: Needs assistance Equipment used: Rolling walker (2 wheeled) Transfers: Sit to/from Stand Sit to Stand: Mod assist          General transfer comment: Verbal cues for hand placement.  Assist to power up to standing.  In stance, required min-mod assist due to RLE buckling in stance.    Ambulation/Gait             General Gait Details: Patient declined to take steps due to RLE buckling.  Stairs            Wheelchair Mobility    Modified Rankin (Stroke Patients Only)       Balance Overall balance assessment: Needs assistance Sitting-balance support: No upper extremity supported;Feet supported Sitting balance-Leahy Scale: Fair     Standing balance support: Bilateral upper extremity supported Standing balance-Leahy Scale: Poor                               Pertinent Vitals/Pain Pain Assessment: 0-10 Pain Score: 10-Worst pain ever Pain Location: back Pain Descriptors / Indicators: Aching;Grimacing;Moaning;Sharp;Stabbing Pain Intervention(s): Premedicated before session;Monitored during session;Repositioned    Home Living Family/patient expects to be discharged to:: Private residence Living Arrangements: Spouse/significant other Available Help at Discharge: Family;Friend(s);Available 24 hours/day Type of Home: House Home Access: Stairs to enter   [employer] of Steps: 2 Home Layout: One level Home Equipment: Walker - 2 wheels;Wheelchair - manual;Cane - single point      Prior Function Level of Independence: Independent with assistive device(s);Needs assistance   Gait / Transfers Assistance Needed: Uses cane and lumbar corsett during gait.  ADL's / Homemaking Assistance Needed: [place] when wife is in the house for safety.  Does not drive.  Hand Dominance        Extremity/Trunk Assessment   Upper Extremity Assessment Upper Extremity Assessment: Overall WFL for tasks assessed;RUE deficits/detail;LUE deficits/detail RUE Sensation: history of peripheral neuropathy;decreased light touch (4th and 5th fingers) LUE Sensation: history of  peripheral neuropathy;decreased light touch (4th and 5th fingers)    Lower Extremity Assessment Lower Extremity Assessment: Generalized weakness;RLE deficits/detail;LLE deficits/detail RLE Deficits / Details: Decreased strength - at 4-/5.  Buckling in stance.  Foot numb. RLE: Unable to fully assess due to pain RLE Sensation: history of peripheral neuropathy;decreased light touch (Foot) LLE Deficits / Details: Decreased strength grossly 4/5.  Foot numb. LLE: Unable to fully assess due to pain LLE Sensation: history of peripheral neuropathy;decreased light touch (Foot)       Communication   Communication: No difficulties  Cognition Arousal/Alertness: Awake/alert Behavior During Therapy: WFL for tasks assessed/performed;Anxious Overall Cognitive Status: Within Functional Limits for tasks assessed                                        General Comments      Exercises     Assessment/Plan    PT Assessment Patient needs continued PT services  PT Problem List Decreased strength;Decreased activity tolerance;Decreased balance;Decreased mobility;Decreased knowledge of use of DME;Impaired sensation;Pain       PT Treatment Interventions DME instruction;Gait training;Stair training;Functional mobility training;Therapeutic activities;Patient/family education    PT Goals (Current goals can be found in the Care Plan section)  Acute Rehab PT Goals Patient Stated Goal: Decrease pain PT Goal Formulation: With patient Time For Goal Achievement: 12/07/16 Potential to Achieve Goals: Good    Frequency Min 5X/week   Barriers to discharge        Co-evaluation               End of Session Equipment Utilized During Treatment: Back brace;Gait belt Activity Tolerance: Patient limited by pain Patient left: in bed;with call bell/phone within reach;with bed alarm set;with family/visitor present;with SCD's reapplied Nurse Communication: Mobility status;Patient requests  pain meds PT Visit Diagnosis: Unsteadiness on feet (R26.81);Other abnormalities of gait and mobility (R26.89);Muscle weakness (generalized) (M62.81);Pain Pain - part of body:  (back)    Time: 1610-9604 PT Time Calculation (min) (ACUTE ONLY): 40 min   Charges:   PT Evaluation $PT Eval Moderate Complexity: 1 Procedure PT Treatments $Therapeutic Activity: 23-37 mins   PT G Codes:        Durenda Hurt. Renaldo Fiddler, [facility] Acute Rehab Services Pager [phone]   Vena Austria 11/30/2016, 8:35 PM

## 2016-11-30 NOTE — Progress Notes (Signed)
PROGRESS NOTE    [name]  ZOX:[id number] DOB: 09-18-1960 DOA: 11/28/2016 PCP: [name] Radon Dettinger, MD   Brief Narrative: 56 y.o. male with medical history significant of prior lumbar compression fracture, chronic low back pain.  Patient presents to the ED with c/o acute worsening of back pain and RLE sciatic pain following a mechanical fall off of a ladder at home.   In ED, patient was found to have L2 compression fracture. Neurosurgery was consulted and patient was admitted for further evaluation.  Assessment & Plan:   #  Compression fracture of lumbar vertebra [facility]): -Evaluated by neurosurgery. Patient has history of chronic lower back pain with radiculopathy. No neurosurgical intervention recommended at this time. Neurosurgery recommended TLSO brace and follow-up in 4 weeks at the clinic. -Pain management -Ordered PT OT evaluation is starting from tomorrow -Discussed with the patient and his wife at bedside regarding pain management and importance of rehabilitation.  # Pain management:  -Apparently patient was started on sustained-release morphine every 8 hourly, Dilaudid PCA pump on admission. During my examination this morning, patient was somnolent but was still complaining of pain. He had hypoxia requiring oxygen supplementation to maintain oxygen saturation more than 90%. I was worried about his respiratory status. I discussed with him regarding discontinuing PCA pump and adjusting his pain medications.   discontinued Dilaudid PCA pump and changed to sustained-release morphine 15 mg twice a day ( from every 8 hour dose), Percocet every 6 hourly as needed, IV Dilaudid as needed every 3 hourly for the pain management. I discussed with the patient, his wife and nursing regarding importance of controlling his pain medicine and at the same time watching his mental status and respiratory status closely.   continue Neurontin 1200 mg 3 times a day -Added MiraLAX for the bowel regimen.  Patient reported he has no issues with constipation however education provided to the patient regarding the risk of constipation while on opiates.  -Patient was listed as allergy to ketorolac.    # Type 2 diabetes mellitus (HCC) with hypoglycemia this morning: Patient did not eat this morning when he was on PCA pump and somnolent. He received his regular dose of insulin and had hypoglycemia.  -Patient received oral juice with improvement in blood sugar level. Since patient's mental status is improving now after discontinuation of PCA pump, his mental status is improving and able to eat. I'll continue current insulin regimen with close monitoring of blood sugar level. Discussed with the patient's nurse.  #Somnolent due to opiates: Discussed with the patient as above. Close monitor mental status.  #Thrombocytopenia: Platelet level found to have 105>84. I noticed that the patient had few episodes of thrombocytopenia in the past. Unknown etiology. Not on anticoagulation. Repeat CBC in the morning.    DVT prophylaxis: SCD. Not on anticoagulation because of thrombocytopenia and patient possibly has a small epidural hematoma associated with the fracture. Code Status: full code Family Communication: I discussed with the patient's wife at bedside Disposition Plan: PT eval pending     Consultants:   Neurosurgery  Procedures: none Antimicrobials: none  Subjective: CBG low as patient said he skipped his dinner   Denied headache, dizziness, nausea, vomiting, chest pain or shortness of breath. Wife at bedside.  Objective: Vitals:   11/29/16 1453 11/29/16 2105 11/30/16 0505 11/30/16 1236  BP: 126/82 126/75 123/76   Pulse: 78 80 71   Resp: Temp: 98.1 F (36.7 C) 98.5 F (36.9 C) 99 F (  37.2 C)   TempSrc: Oral Oral Oral   SpO2: 93% 95% 96% 96%  Weight:      Height:        Intake/Output Summary (Last 24 hours) at 11/30/16 1330 Last data filed at 11/30/16 1610  Gross per 24  hour  Intake              421 ml  Output             2100 ml  Net            -1679 ml   Filed Weights   11/29/16 0251  Weight: 83.5 kg (184 lb)    Examination:  General exam: Looks somnolent, asking pain medication while awake but mostly closing his eyes Respiratory system: Clear to auscultation. Respiratory effort normal. No wheezing or crackle Cardiovascular system: S1 & S2 heard, RRR.  No pedal edema. Gastrointestinal system: Abdomen is nondistended, soft and nontender. Normal bowel sounds heard. Central nervous system: Alert and oriented. No focal neurological deficits. Extremities: Able to move lower extremities and sensation intact. Unable to examine strength because of back pain Skin: No rashes, lesions or ulcers Psychiatry:  Somnolent.    Data Reviewed: I have personally reviewed following labs and imaging studies  CBC:  Recent Labs Lab 11/28/16 2351 11/30/16 0422  WBC 6.6 6.3  NEUTROABS 4.0  --   HGB 12.2* 12.5*  HCT 37.7* 39.2  MCV 101.9* 103.7*  PLT 105* 84*   Basic Metabolic Panel:  Recent Labs Lab 11/28/16 2351  NA 140  K 4.1  CL 106  CO2 24  GLUCOSE 77  BUN 12  CREATININE 0.68  CALCIUM 8.4*   GFR: Estimated Creatinine Clearance: 105.6 mL/min (by C-G formula based on SCr of 0.68 mg/dL). Liver Function Tests: No results for input(s): AST, ALT, ALKPHOS, BILITOT, PROT, ALBUMIN in the last 168 hours. No results for input(s): LIPASE, AMYLASE in the last 168 hours. No results for input(s): AMMONIA in the last 168 hours. Coagulation Profile: No results for input(s): INR, PROTIME in the last 168 hours. Cardiac Enzymes: No results for input(s): CKTOTAL, CKMB, CKMBINDEX, TROPONINI in the last 168 hours. BNP (last 3 results) No results for input(s): PROBNP in the last 8760 hours. HbA1C: No results for input(s): HGBA1C in the last 72 hours. CBG:  Recent Labs Lab 11/29/16 1629 11/29/16 1800 11/29/16 2040 11/30/16 0824 11/30/16 1226  GLUCAP  64* 80 129* 166* 187*   Lipid Profile: No results for input(s): CHOL, HDL, LDLCALC, TRIG, CHOLHDL, LDLDIRECT in the last 72 hours. Thyroid Function Tests: No results for input(s): TSH, T4TOTAL, FREET4, T3FREE, THYROIDAB in the last 72 hours. Anemia Panel: No results for input(s): VITAMINB12, FOLATE, FERRITIN, TIBC, IRON, RETICCTPCT in the last 72 hours. Sepsis Labs: No results for input(s): PROCALCITON, LATICACIDVEN in the last 168 hours.  No results found for this or any previous visit (from the past 240 hour(s)).       Radiology Studies: Mr Lumbar Spine Wo Contrast  Result Date: 11/28/2016 CLINICAL DATA:  56 y/o M; lower back pain with right hip/ leg pain after falling today. EXAM: MRI LUMBAR SPINE WITHOUT CONTRAST TECHNIQUE: Multiplanar, multisequence MR imaging of the lumbar spine was performed. No intravenous contrast was administered. COMPARISON:  08/16/2015 lumbar radiographs.  07/07/2015 lumbar MRI. FINDINGS: Segmentation: Numbering of lumbar vertebral bodies same as prior lumbar MRI with transitional S1 and S1-2 disc. Alignment:  Physiologic. Vertebrae: Superior endplate deformity of T11 with mild loss of height and mild diffuse  loss of height at L5 vertebral body. Progressive interval moderate loss of height of the T12, L2, L3, and L4 vertebral bodies. Superior compression deformity of L1 vertebral body with severe anterior loss of height. There is edema present within the L2 vertebral body centered around the inferior endplate indicating recent injury. There is no evidence for discitis or suspicious osseous lesion. There is low signal within the L3 vertebral body compatible with kyphoplasty changes. Conus medullaris: Extends to the L2 level and appears normal. Paraspinal and other soft tissues: Negative. Disc levels: L1-2: No significant disc displacement, foraminal narrowing, or canal stenosis. L2-3: Small disc bulge and mild facet hypertrophy. No significant foraminal narrowing.  Posterior to the C2 vertebral body there is a oblong extradural structure measuring 3 x 9 x 13 mm (AP x ML x CC series 6, image 12 and series 7, image 7) which may represent a small epidural hematoma associated with the fracture or sequestered disc fragment. The structures effaces the ventral thecal sac and results in mild canal stenosis. L3-4: Small disc bulge with mild facet and ligamentum flavum hypertrophy greater on the left. Mild bilateral recess narrowing. Mild left foraminal narrowing. Mild canal stenosis. L4-5: Small to moderate disc bulge with mild facet and ligamentum flavum hypertrophy. Mild bilateral recess and foraminal narrowing. Mild canal stenosis. L5-S1: Moderate disc bulge with right foraminal marginal osteophytes and annular fissure combined with moderate facet hypertrophy. Moderate to severe right and mild left foraminal narrowing. No significant canal stenosis. IMPRESSION: 1. Acute L2 vertebral body inferior endplate fracture with moderate loss of height of the vertebral body. 2. Oblong epidural structure posterior to the L2 vertebral body may represent a small epidural hematoma associated with the fracture or sequestered disc fragment. The structure effaces the ventral thecal sac and results in mild canal stenosis. 3. Interval progressive loss of height of the T11, T12, L1, L3, L4, and L5 vertebral bodies without edema indicating chronic fractures. 4. Lumbar spondylosis with multiple levels of mild canal stenosis and mild foraminal narrowing. 5. Facet and disc disease results in moderate to severe right-sided foraminal narrowing at L5-S1. Electronically Signed   By: Mitzi Hansen M.D.   On: 11/28/2016 23:32        Scheduled Meds: . gabapentin  1,200 mg Oral TID  . guaiFENesin  600 mg Oral BID  .  HYDROmorphone (DILAUDID) injection  1 mg Intravenous Once  . insulin aspart  0-15 Units Subcutaneous TID WC  . morphine  15 mg Oral Q12H  . pantoprazole  20 mg Oral BID  .  polyethylene glycol  17 g Oral Daily  . potassium chloride SA  20 mEq Oral Daily  . Teriparatide (Recombinant)  20 mcg Subcutaneous Daily   Continuous Infusions:   LOS: 1 day    [name] Overlie, MD [facility] Pager [phone]   If 7PM-7AM, please contact night-coverage [email or web address] Password [email or web address] 11/30/2016, 1:30 PM

## 2016-12-01 DIAGNOSIS — S32029A Unspecified fracture of second lumbar vertebra, initial encounter for closed fracture: Secondary | ICD-10-CM

## 2016-12-01 LAB — GLUCOSE, CAPILLARY
GLUCOSE-CAPILLARY: 285 mg/dL — AB (ref 65–99)
Glucose-Capillary: 119 mg/dL — ABNORMAL HIGH (ref 65–99)
Glucose-Capillary: 323 mg/dL — ABNORMAL HIGH (ref 65–99)

## 2016-12-01 MED ORDER — INSULIN GLARGINE 100 UNIT/ML SOLOSTAR PEN: 20.0000 [IU] | PEN_INJECTOR | Freq: Every morning | SUBCUTANEOUS | 4 refills | Status: DC

## 2016-12-01 MED ORDER — GUAIFENESIN ER 600 MG PO TB12: 600.0000 mg | ORAL_TABLET | Freq: Two times a day (BID) | ORAL | 0 refills | Status: DC

## 2016-12-01 MED ORDER — GABAPENTIN 400 MG PO CAPS: 1200.0000 mg | ORAL_CAPSULE | Freq: Three times a day (TID) | ORAL | 0 refills | Status: DC

## 2016-12-01 MED ORDER — METHOCARBAMOL 500 MG PO TABS: 500.0000 mg | ORAL_TABLET | Freq: Three times a day (TID) | ORAL | 0 refills | Status: AC | PRN

## 2016-12-01 MED ORDER — METHOCARBAMOL 1000 MG/10ML IJ SOLN
500.0000 mg | Freq: Once | INTRAMUSCULAR | Status: AC
  Administered 2016-12-01: 500 mg via INTRAVENOUS
  Filled 2016-12-01: qty 5

## 2016-12-01 MED ORDER — POLYETHYLENE GLYCOL 3350 17 G PO PACK: 17.0000 g | PACK | Freq: Two times a day (BID) | ORAL | 0 refills | Status: DC

## 2016-12-01 MED ORDER — MORPHINE SULFATE ER 15 MG PO TBCR: 15.0000 mg | EXTENDED_RELEASE_TABLET | Freq: Three times a day (TID) | ORAL | 0 refills | Status: AC

## 2016-12-01 NOTE — Progress Notes (Signed)
PT Cancellation Note  Patient Details Name: [name] MRN: [id number] DOB: 04/16/1961   Cancelled Treatment:    Pt requesting PT to return later after pain medication given. Talked with nursing who reports pt due for next dose around 0930. Will check back as able.    Christiane Ha, PT, CSCS Pager [phone] Office [phone]  12/01/2016, 8:21 AM

## 2016-12-01 NOTE — Evaluation (Signed)
Occupational Therapy Evaluation Patient Details Name: [name] MRN: [id number] DOB: Jul 29, 1961 Today's Date: 12/01/2016    History of Present Illness Patient is a 56 yo male admitted 11/28/16 after fall from ladder with L2 compression fracture.  Patient with TLSO to be worn at all times except in bed.    PMH:  prior lumbar compression fracture, chronic back pain, DM, HTN, osteoporosis, PTSD.   Clinical Impression   Patient evaluated by Occupational Therapy with no further acute OT needs identified. All education has been completed and the patient has no further questions.All education completed - further needs can be addressed by HHOT.  See below for any follow-up Occupational Therapy or equipment needs. OT is signing off. Thank you for this referral.      Follow Up Recommendations  Home health OT;Supervision/Assistance - 24 hour    Equipment Recommendations  None recommended by OT    Recommendations for Other Services       Precautions / Restrictions Precautions Precautions: Back Precaution Comments: Pt able to state 3/3 back precautions  Required Braces or Orthoses: Spinal Brace Spinal Brace: Thoracolumbosacral orthotic;Applied in sitting position      Mobility Bed Mobility                  Transfers                      Balance                                           ADL either performed or assessed with clinical judgement   ADL                                         General ADL Comments: Pt sitting up eating lunch.  He deferred actual ADL practice.  He was able to describe safe technique for LB ADLs and has a sock aid.  He sits to shower and uses LH bath sponge.  Reviewed no bending when brushing teeth and to spit into cup; avoiding bending while shaving, and no household management/cleaning activities until cleared by MD      Vision         Perception     Praxis      Pertinent Vitals/Pain Pain  Assessment: Faces Faces Pain Scale: Hurts little more Pain Location: back Pain Descriptors / Indicators: Grimacing Pain Intervention(s): Monitored during session;Limited activity within patient's tolerance     Hand Dominance Right   Extremity/Trunk Assessment Upper Extremity Assessment RUE Sensation: decreased light touch (Pt reports numbness and tingling ring and little finger ) LUE Sensation: decreased light touch (Pt reports numbness and tingling ring and little finger )   Lower Extremity Assessment Lower Extremity Assessment: Defer to PT evaluation       Communication Communication Communication: No difficulties   Cognition Arousal/Alertness: Awake/alert Behavior During Therapy: WFL for tasks assessed/performed;Anxious Overall Cognitive Status: Within Functional Limits for tasks assessed                                     General Comments  Pt reports numbness tingling little and ring finger bil. hands that worsens when he props on his elbows.  Instructed him to discuss this with MD.  Also instructed him to avoid propping on elbows and to pad elbows when he does lean on them - he was provided with elbow pads and silicone gel to help alleviate compression to ulnar nerve.   He verbalized understanding of all     Exercises     Shoulder Instructions      Home Living Family/patient expects to be discharged to:: Private residence Living Arrangements: Spouse/significant other Available Help at Discharge: Family;Friend(s);Available 24 hours/day Type of Home: House Home Access: Stairs to enter [employer] of Steps: 2   Home Layout: One level     Bathroom Shower/Tub: Walk-in shower;Door   Bathroom Toilet: Handicapped height     Home Equipment: Environmental consultant - 2 wheels;Wheelchair - manual;Cane - single point;Adaptive equipment Adaptive Equipment: Sock aid;Long-handled sponge Additional Comments: wife able to assist, per pt       Prior  Functioning/Environment Level of Independence: Independent with assistive device(s);Needs assistance  Gait / Transfers Assistance Needed: Uses cane and lumbar corsett during gait. ADL's / Homemaking Assistance Needed: [place] when wife is in the house for safety.  Does not drive.            OT Problem List: Decreased strength;Decreased activity tolerance;Impaired balance (sitting and/or standing);Decreased knowledge of use of DME or AE;Decreased knowledge of precautions;Pain      OT Treatment/Interventions:      OT Goals(Current goals can be found in the care plan section) Acute Rehab OT Goals Patient Stated Goal: get home OT Goal Formulation: All assessment and education complete, DC therapy  OT Frequency:     Barriers to D/C:            Co-evaluation              End of Session Equipment Utilized During Treatment: Back brace  Activity Tolerance:   Patient left: in chair;with call bell/phone within reach;with chair alarm set  OT Visit Diagnosis: Pain Pain - part of body:  (back )                Time: 6962-9528 OT Time Calculation (min): 32 min Charges:  OT General Charges $OT Visit: 1 Procedure OT Evaluation $OT Eval Low Complexity: 1 Procedure OT Treatments $Self Care/Home Management : 8-22 mins G-Codes:     [employer], OTR/L [phone]   Kielee Care M 12/01/2016, 11:05 PM

## 2016-12-01 NOTE — Progress Notes (Signed)
Stopped by in response to consult to create advanced directive. Pt was just diving into lunch, which he said was breakfast too, since that hadn't come. He seemed unfocused as to whether he'd wanted an advanced directive, said it might be a good idea, he could think about it. I left to him enjoying his meal, advising that he can always ask nurse to request a chaplain if he does decide he wishes to discuss creating advanced directive. Chaplain available for f/u.   12/01/16 1100  Clinical Encounter Type  Visited With Patient  Visit Type Initial;Psychological support;Spiritual support;Social support  Referral From Nurse  Spiritual Encounters  Spiritual Needs Other (Comment) (pt unsure if wanted advanced directive)  Stress Factors  Patient Stress Factors Health changes;Loss of control   Ephraim Hamburger, [address]

## 2016-12-01 NOTE — Progress Notes (Signed)
qPhysical Therapy Treatment Patient Details Name: [name] MRN: [id number] DOB: Aug 19, 1961 Today's Date: 12/01/2016    History of Present Illness Patient is a 56 yo male admitted 11/28/16 after fall from ladder with L2 compression fracture.  Patient with TLSO to be worn at all times except in bed.    PMH:  prior lumbar compression fracture, chronic back pain, DM, HTN, osteoporosis, PTSD.    PT Comments    Pt able to progress mobility, ambulating 55 ft with rw and min guard assistance. Pattern is slow but stable, no loss of balance during session. Pt states that he feels confident with returning home with his wife to assist once released from the hospital. Recommending HHPT services to follow. Discussed DME and pt declines needing any equipment. PT to continue to follow acutely.    Follow Up Recommendations  Home health PT;Supervision for mobility/OOB     Equipment Recommendations  None recommended by PT    Recommendations for Other Services       Precautions / Restrictions Precautions Precautions: Back Precaution Comments: reviewed back precautions Required Braces or Orthoses: Spinal Brace Spinal Brace: Thoracolumbosacral orthotic;Applied in sitting position Restrictions Weight Bearing Restrictions: No    Mobility  Bed Mobility Overal bed mobility: Needs Assistance Bed Mobility: Sidelying to Sit Rolling: Supervision Sidelying to sit: Supervision       General bed mobility comments: using rail to asssit to sitting. Reviewing logroll technique  Transfers Overall transfer level: Needs assistance Equipment used: Rolling walker (2 wheeled) Transfers: Sit to/from Stand Sit to Stand: Min assist         General transfer comment: cues for hand placement but pt not following cues  Ambulation/Gait Ambulation/Gait assistance: Min guard Ambulation Distance (Feet): 55 Feet Assistive device: Rolling walker (2 wheeled) Gait Pattern/deviations: Step-through  pattern;Decreased weight shift to right Gait velocity: decreased   General Gait Details: Encouragement provided for pt to attempt ambulation. Pattern is slow with reports of Rt LE pain. No knee buckling noted.    Stairs Stairs:  (pt refused attempting)          Wheelchair Mobility    Modified Rankin (Stroke Patients Only)       Balance Overall balance assessment: Needs assistance Sitting-balance support: No upper extremity supported Sitting balance-Leahy Scale: Good     Standing balance support: Bilateral upper extremity supported Standing balance-Leahy Scale: Poor Standing balance comment: using rw for support                            Cognition Arousal/Alertness: Awake/alert Behavior During Therapy: WFL for tasks assessed/performed;Anxious Overall Cognitive Status: Within Functional Limits for tasks assessed                                        Exercises      General Comments        Pertinent Vitals/Pain Pain Assessment: 0-10 Pain Score: 8  Pain Location: back Pain Descriptors / Indicators: Moaning;Aching Pain Intervention(s): Monitored during session;Premedicated before session    Home Living                      Prior Function            PT Goals (current goals can now be found in the care plan section) Acute Rehab PT Goals Patient Stated Goal: get home PT  Goal Formulation: With patient Time For Goal Achievement: 12/07/16 Potential to Achieve Goals: Good Progress towards PT goals: Progressing toward goals    Frequency    Min 5X/week      PT Plan Current plan remains appropriate    Co-evaluation             End of Session Equipment Utilized During Treatment: Back brace;Gait belt Activity Tolerance: Patient tolerated treatment well Patient left: in chair;with call bell/phone within reach Nurse Communication: Mobility status;Patient requests pain meds PT Visit Diagnosis: Unsteadiness on  feet (R26.81);Other abnormalities of gait and mobility (R26.89);Muscle weakness (generalized) (M62.81);Pain Pain - Right/Left: Right Pain - part of body: Leg     Time: 1610-9604 PT Time Calculation (min) (ACUTE ONLY): 18 min  Charges:  $Gait Training: 8-22 mins                    G Codes:       Christiane [name], PT, CSCS Pager [phone] Office [phone] 12/01/2016, 10:13 AM

## 2016-12-01 NOTE — Care Management Note (Signed)
Case Management Note  Patient Details  Name: [name] MRN: [id number] Date of Birth: Jan 06, 1961  Subjective/Objective:          Admitted 11/28/16 after fall from ladder with L2 compression fracture, hx of PMH:  prior lumbar compression fracture, chronic back pain, DM, HTN, osteoporosis, PTSD. From home with wife.   Action/Plan: Plan is to d/c to home today with home health services (PT).   Expected Discharge Date:                  Expected Discharge Plan:  Home w Home Health Services  In-House Referral:     Discharge planning Services  CM Consult  Post Acute Care Choice:    Choice offered to:  Patient  DME Arranged:    DME Agency:     HH Arranged:  PT, OT HH Agency:  [facility] (referral made to Lupita Leash @ [phone])  Status of Service:   completed  If discussed at Long Length of Stay Meetings, dates discussed:    Additional Comments:  Epifanio Lesches, RN 12/01/2016, 10:43 AM

## 2016-12-01 NOTE — Discharge Summary (Signed)
Physician Discharge Summary  SHOAIB SIEFKER MRN: [id number] DOB/AGE: 1961-06-01 56 y.o.  PCP: Elige Radon Dettinger, MD   Admit date: 11/28/2016 Discharge date: 12/01/2016  Discharge Diagnoses:    Principal Problem:   Compression fracture of lumbar vertebra [facility]) Active Problems:   Type 2 diabetes mellitus (HCC)   Fall   Closed fracture of second lumbar vertebra (HCC)    Follow-up recommendations Follow-up with PCP in 3-5 days , including all  additional recommended appointments as below Follow-up CBC, CMP in 3-5 days Follow platelets closely in the outpatient setting Patient to follow-up with neurosurgery Dr. Bevely Palmer,  F/U 4 weeks at [facility] for repeat Xray     Current Discharge Medication List    START taking these medications   Details  guaiFENesin (MUCINEX) 600 MG 12 hr tablet Take 1 tablet (600 mg total) by mouth 2 (two) times daily. Qty: 10 tablet, Refills: 0    methocarbamol (ROBAXIN) 500 MG tablet Take 1 tablet (500 mg total) by mouth every 8 (eight) hours as needed for muscle spasms. Qty: 45 tablet, Refills: 0    polyethylene glycol (MIRALAX / GLYCOLAX) packet Take 17 g by mouth 2 (two) times daily. Qty: 14 each, Refills: 0      CONTINUE these medications which have CHANGED   Details  gabapentin (NEURONTIN) 400 MG capsule Take 3 capsules (1,200 mg total) by mouth 3 (three) times daily. Qty: 90 capsule, Refills: 0    morphine (MS CONTIN) 15 MG 12 hr tablet Take 1 tablet (15 mg total) by mouth 3 (three) times daily. Qty: 15 tablet, Refills: 0      CONTINUE these medications which have NOT CHANGED   Details  albuterol (PROVENTIL HFA;VENTOLIN HFA) 108 (90 BASE) MCG/ACT inhaler Inhale 1 puff into the lungs every 6 (six) hours as needed for wheezing or shortness of breath.    Calcium Carb-Cholecalciferol (CALCIUM-VITAMIN D) 600-400 MG-UNIT TABS Take 1 tablet by mouth daily.    cyanocobalamin 1000 MCG tablet Take 100 mcg by mouth daily.    ergocalciferol (VITAMIN  D2) 50000 UNITS capsule Take 50,000 Units by mouth once a week. On Saturday    insulin aspart (NOVOLOG FLEXPEN) 100 UNIT/ML FlexPen 5-7 units depending on carb intake Qty: 9 mL, Refills: 6   Associated Diagnoses: Type 2 diabetes mellitus with hyperglycemia (HCC)    Insulin Glargine (LANTUS SOLOSTAR) 100 UNIT/ML Solostar Pen Inject 20 Units into the skin daily. Qty: 12 pen, Refills: 4   Associated Diagnoses: Type 2 diabetes mellitus with hyperglycemia, with long-term current use of insulin (HCC)    oxyCODONE (OXY IR/ROXICODONE) 5 MG immediate release tablet Take 1 tablet (5 mg total) by mouth every 4 (four) hours as needed for moderate pain, severe pain or breakthrough pain. Qty: 15 tablet, Refills: 0    pantoprazole (PROTONIX) 20 MG tablet Take 20 mg by mouth 2 (two) times daily.     Potassium Chloride ER 20 MEQ TBCR Take 20 mEq by mouth daily. Qty: 90 tablet, Refills: 1   Associated Diagnoses: Hypokalemia    Teriparatide, Recombinant, (FORTEO) 600 MCG/2.4ML SOLN Inject 20 mcg into the skin daily.         Discharge Condition: Stable   Discharge Instructions Get Medicines reviewed and adjusted: Please take all your medications with you for your next visit with your Primary MD  Please request your Primary MD to go over all hospital tests and procedure/radiological results at the follow up, please ask your Primary MD to get all Hospital records sent  to his/her office.  If you experience worsening of your admission symptoms, develop shortness of breath, life threatening emergency, suicidal or homicidal thoughts you must seek medical attention immediately by calling 911 or calling your MD immediately if symptoms less severe.  You must read complete instructions/literature along with all the possible adverse reactions/side effects for all the Medicines you take and that have been prescribed to you. Take any new Medicines after you have completely understood and accpet all the possible  adverse reactions/side effects.   Do not drive when taking Pain medications.   Do not take more than prescribed Pain, Sleep and Anxiety Medications  Special Instructions: If you have smoked or chewed Tobacco in the last 2 yrs please stop smoking, stop any regular Alcohol and or any Recreational drug use.  Wear Seat belts while driving.  Please note  You were cared for by a hospitalist during your hospital stay. Once you are discharged, your primary care physician will handle any further medical issues. Please note that NO REFILLS for any discharge medications will be authorized once you are discharged, as it is imperative that you return to your primary care physician (or establish a relationship with a primary care physician if you do not have one) for your aftercare needs so that they can reassess your need for medications and monitor your lab values.     Allergies  Allergen Reactions  . Temsirolimus Swelling  . Ketorolac Tromethamine     Hives (toradol)      Disposition: 01-Home or Self Care   Consults: * neurosurgery    Significant Diagnostic Studies:  Mr Lumbar Spine Wo Contrast  Result Date: 11/28/2016 CLINICAL DATA:  56 y/o M; lower back pain with right hip/ leg pain after falling today. EXAM: MRI LUMBAR SPINE WITHOUT CONTRAST TECHNIQUE: Multiplanar, multisequence MR imaging of the lumbar spine was performed. No intravenous contrast was administered. COMPARISON:  08/16/2015 lumbar radiographs.  07/07/2015 lumbar MRI. FINDINGS: Segmentation: Numbering of lumbar vertebral bodies same as prior lumbar MRI with transitional S1 and S1-2 disc. Alignment:  Physiologic. Vertebrae: Superior endplate deformity of T11 with mild loss of height and mild diffuse loss of height at L5 vertebral body. Progressive interval moderate loss of height of the T12, L2, L3, and L4 vertebral bodies. Superior compression deformity of L1 vertebral body with severe anterior loss of height. There is  edema present within the L2 vertebral body centered around the inferior endplate indicating recent injury. There is no evidence for discitis or suspicious osseous lesion. There is low signal within the L3 vertebral body compatible with kyphoplasty changes. Conus medullaris: Extends to the L2 level and appears normal. Paraspinal and other soft tissues: Negative. Disc levels: L1-2: No significant disc displacement, foraminal narrowing, or canal stenosis. L2-3: Small disc bulge and mild facet hypertrophy. No significant foraminal narrowing. Posterior to the C2 vertebral body there is a oblong extradural structure measuring 3 x 9 x 13 mm (AP x ML x CC series 6, image 12 and series 7, image 7) which may represent a small epidural hematoma associated with the fracture or sequestered disc fragment. The structures effaces the ventral thecal sac and results in mild canal stenosis. L3-4: Small disc bulge with mild facet and ligamentum flavum hypertrophy greater on the left. Mild bilateral recess narrowing. Mild left foraminal narrowing. Mild canal stenosis. L4-5: Small to moderate disc bulge with mild facet and ligamentum flavum hypertrophy. Mild bilateral recess and foraminal narrowing. Mild canal stenosis. L5-S1: Moderate disc bulge with right foraminal  marginal osteophytes and annular fissure combined with moderate facet hypertrophy. Moderate to severe right and mild left foraminal narrowing. No significant canal stenosis. IMPRESSION: 1. Acute L2 vertebral body inferior endplate fracture with moderate loss of height of the vertebral body. 2. Oblong epidural structure posterior to the L2 vertebral body may represent a small epidural hematoma associated with the fracture or sequestered disc fragment. The structure effaces the ventral thecal sac and results in mild canal stenosis. 3. Interval progressive loss of height of the T11, T12, L1, L3, L4, and L5 vertebral bodies without edema indicating chronic fractures. 4. Lumbar  spondylosis with multiple levels of mild canal stenosis and mild foraminal narrowing. 5. Facet and disc disease results in moderate to severe right-sided foraminal narrowing at L5-S1. Electronically Signed   By: Mitzi Hansen M.D.   On: 11/28/2016 23:32    echocardiogram        Filed Weights   11/29/16 0251  Weight: 83.5 kg (184 lb)     Microbiology: No results found for this or any previous visit (from the past 240 hour(s)).     Blood Culture    Component Value Date/Time   SDES BLOOD RIGHT HAND 01/17/2015 1520   SPECREQUEST BOTTLES DRAWN AEROBIC ONLY 10CC 01/17/2015 1520   CULT  01/17/2015 1520    NO GROWTH 5 DAYS Performed at [facility]    REPTSTATUS 01/23/2015 FINAL 01/17/2015 1520      Labs: Results for orders placed or performed during the hospital encounter of 11/28/16 (from the past 48 hour(s))  Glucose, capillary     Status: Abnormal   Collection Time: 11/29/16 11:48 AM  Result Value Ref Range   Glucose-Capillary 37 (LL) 65 - 99 mg/dL   Comment 1 Notify RN   Glucose, capillary     Status: Abnormal   Collection Time: 11/29/16 12:18 PM  Result Value Ref Range   Glucose-Capillary 123 (H) 65 - 99 mg/dL  Glucose, capillary     Status: Abnormal   Collection Time: 11/29/16  4:29 PM  Result Value Ref Range   Glucose-Capillary 64 (L) 65 - 99 mg/dL  Glucose, capillary     Status: None   Collection Time: 11/29/16  6:00 PM  Result Value Ref Range   Glucose-Capillary 80 65 - 99 mg/dL  Glucose, capillary     Status: Abnormal   Collection Time: 11/29/16  8:40 PM  Result Value Ref Range   Glucose-Capillary 129 (H) 65 - 99 mg/dL  CBC     Status: Abnormal   Collection Time: 11/30/16  4:22 AM  Result Value Ref Range   WBC 6.3 4.0 - 10.5 K/uL   RBC 3.78 (L) 4.22 - 5.81 MIL/uL   Hemoglobin 12.5 (L) 13.0 - 17.0 g/dL   HCT 16.1 09.6 - 04.5 %   MCV 103.7 (H) 78.0 - 100.0 fL   MCH 33.1 26.0 - 34.0 pg   MCHC 31.9 30.0 - 36.0 g/dL   RDW 40.9 81.1 -  91.4 %   Platelets 84 (L) 150 - 400 K/uL    Comment: CONSISTENT WITH PREVIOUS RESULT  Glucose, capillary     Status: Abnormal   Collection Time: 11/30/16  8:24 AM  Result Value Ref Range   Glucose-Capillary 166 (H) 65 - 99 mg/dL  Glucose, capillary     Status: Abnormal   Collection Time: 11/30/16 12:26 PM  Result Value Ref Range   Glucose-Capillary 187 (H) 65 - 99 mg/dL  Glucose, capillary     Status: Abnormal  Collection Time: 11/30/16  5:06 PM  Result Value Ref Range   Glucose-Capillary 238 (H) 65 - 99 mg/dL  Glucose, capillary     Status: Abnormal   Collection Time: 11/30/16 10:00 PM  Result Value Ref Range   Glucose-Capillary 285 (H) 65 - 99 mg/dL  Glucose, capillary     Status: Abnormal   Collection Time: 12/01/16  8:03 AM  Result Value Ref Range   Glucose-Capillary 323 (H) 65 - 99 mg/dL     Lipid Panel     Component Value Date/Time   CHOL 139 04/09/2013 2240   TRIG 230 (H) 01/23/2015 0520   HDL 68 04/09/2013 2240   CHOLHDL 2.0 04/09/2013 2240   VLDL 15 04/09/2013 2240   LDLCALC 56 04/09/2013 2240     Lab Results  Component Value Date   HGBA1C 7.6 (H) 04/09/2013   HGBA1C 9.2 (H) 02/28/2011   HGBA1C 6.1 05/01/2010        HPI :*  [name] is a 56 y.o. male with medical history significant of prior lumbar compression fracture, chronic low back pain.  Patient presents to the ED with c/o acute worsening of back pain and RLE sciatic pain following a mechanical fall off of a ladder at home today.  Pain is persistent, severe, radiates down R leg.  Some leg weakness but attributes this to pain.  Pain is 10/10.  Nothing makes it better, certain positions make it worse.  No incontinence or urinary retention, no saddle anesthesia.   ED Course: Acute L2 compression fracture with small amount of blood in canal causing mild canal stenosis.  EDP called and spoke with neurosurgery (I presume Dr. Bevely Palmer as he is on call), and they did not feel that patient required  emergent operative management and should be safe for TLSO brace and pain management   HOSPITAL COURSE:  #  Compression fracture of lumbar vertebra [facility]): -Evaluated by neurosurgery. Patient has history of chronic lower back pain with radiculopathy. No neurosurgical intervention recommended at this time. Neurosurgery recommended TLSO brace and follow-up in 4 weeks at the clinic. -Pain management  PT recommends home health    # Pain management:  -Apparently patient was started on sustained-release morphine every 8 hourly, Dilaudid PCA pump on admission. Patient still complaining of pain. Due to hypoxia requiring oxygen supplementation to maintain oxygen saturation more than 90%. PCA discontinued.   continue Neurontin 1200 mg 3 times a day [refill provided] Provided 10 day supply of morphine sulfate Also provided patient with Robaxin -Added MiraLAX for the bowel regimen. Patient reported he has no issues with constipation however education provided to the patient regarding the risk of constipation while on opiates.       # Type 2 diabetes mellitus  patient is on Lantus which has been reduced from 37 units to 20 units Patient recommended to continue with Accu-Cheks  #Somnolent due to opiates: Patient counseled about Cautious use of narcotics at home,    #Thrombocytopenia: Platelet level found to have 105>84. I noticed that the patient had few episodes of thrombocytopenia in the past. Unknown etiology. Not on anticoagulation.  Marland Kitchen     Discharge Exam:   Blood pressure 136/90, pulse 73, temperature 98.2 F (36.8 C), temperature source Oral, resp. rate 18, height  (1.753 m), weight 83.5 kg (184 lb), SpO2 96 %.  General appearance: WDWN, uncomfortable on stretcher Eyes: PERRL Cardiovascular: Regular rate and rhythm without murmurs, rubs, gallops. No edema or variciosities. Distal pulses normal. Pulmonary:  Clear to auscultation Musculoskeletal:     Muscle tone upper extremities:  Normal    Muscle tone lower extremities: Normal    Motor exam: Upper Extremities Deltoid Bicep Tricep Grip  Right 5/5 5/5 5/5 5/5  Left 5/5 5/5 5/5 5/5              Lower extremities: moves lower extremities well but strength limited to pain  Neurological Awake, alert, oriented Memory and concentration grossly intact Speech fluent, appropriate CNII: Visual fields normal CNIII/IV/VI: EOMI CNV: Facial sensation normal CNVII: Symmetric, normal strength CNVIII: Grossly normal CNIX: Normal palate movement CNXI: Trap and SCM strength normal CN XII: Tongue protrusion normal Sensation grossly intact to LT     Follow-up Information    Elige Radon Dettinger, MD. Call.   Specialties:  Family Medicine, Cardiology Why:  Hospital follow-up in 3-5 days Contact information: [address] [place] [address] [phone]        Loura Halt Ditty, MD. Call.   Specialty:  Neurosurgery Why:  To make follow-up appointment in 3-5 days Contact information: [address] [place] [address] [phone]           Signed: Richarda Overlie 12/01/2016, 9:20 AM        Time spent >45 mins

## 2016-12-01 NOTE — Progress Notes (Signed)
Seen and evaluated Pain improving Moves legs well Questions answered Follow up in 4 weeks

## 2016-12-01 NOTE — Progress Notes (Signed)
Nsg Discharge Note  Admit Date:  11/28/2016 Discharge date: 12/01/2016   [name] to be D/C'd Home per MD order.  AVS completed.  Copy for chart, and copy for patient signed, and dated. Patient/caregiver able to verbalize understanding.  Discharge Medication: Allergies as of 12/01/2016      Reactions   Temsirolimus Swelling   Ketorolac Tromethamine    Hives (toradol)      Medication List    TAKE these medications   albuterol 108 (90 Base) MCG/ACT inhaler Commonly known as:  PROVENTIL HFA;VENTOLIN HFA Inhale 1 puff into the lungs every 6 (six) hours as needed for wheezing or shortness of breath.   Calcium-Vitamin D 600-400 MG-UNIT Tabs Take 1 tablet by mouth daily.   cyanocobalamin 1000 MCG tablet Take 100 mcg by mouth daily.   ergocalciferol 50000 units capsule Commonly known as:  VITAMIN D2 Take 50,000 Units by mouth once a week. On Saturday   FORTEO 600 MCG/2.4ML Soln Generic drug:  Teriparatide (Recombinant) Inject 20 mcg into the skin daily.   gabapentin 400 MG capsule Commonly known as:  NEURONTIN Take 3 capsules (1,200 mg total) by mouth 3 (three) times daily.   guaiFENesin 600 MG 12 hr tablet Commonly known as:  MUCINEX Take 1 tablet (600 mg total) by mouth 2 (two) times daily.   insulin aspart 100 UNIT/ML FlexPen Commonly known as:  NOVOLOG FLEXPEN 5-7 units depending on carb intake What changed:  how much to take  how to take this  when to take this  additional instructions   Insulin Glargine 100 UNIT/ML Solostar Pen Commonly known as:  LANTUS SOLOSTAR Inject 20 Units into the skin every morning. What changed:  how much to take  when to take this   methocarbamol 500 MG tablet Commonly known as:  ROBAXIN Take 1 tablet (500 mg total) by mouth every 8 (eight) hours as needed for muscle spasms.   morphine 15 MG 12 hr tablet Commonly known as:  MS CONTIN Take 1 tablet (15 mg total) by mouth 3 (three) times daily.   oxyCODONE 5 MG  immediate release tablet Commonly known as:  Oxy IR/ROXICODONE Take 1 tablet (5 mg total) by mouth every 4 (four) hours as needed for moderate pain, severe pain or breakthrough pain.   pantoprazole 20 MG tablet Commonly known as:  PROTONIX Take 20 mg by mouth 2 (two) times daily.   polyethylene glycol packet Commonly known as:  MIRALAX / GLYCOLAX Take 17 g by mouth 2 (two) times daily.   Potassium Chloride ER 20 MEQ Tbcr Take 20 mEq by mouth daily.       Discharge Assessment: Vitals:   12/01/16 0636 12/01/16 1420  BP: 136/90 129/76  Pulse: 73 (!) 101  Resp: 18 18  Temp: 98.2 F (36.8 C) 98.3 F (36.8 C)   Small abrasion on RL IV catheter discontinued intact. Site without signs and symptoms of complications - no redness or edema noted at insertion site, patient denies c/o pain - only slight tenderness at site.  Dressing with slight pressure applied.  D/c Instructions-Education: Discharge instructions given to patient/family with verbalized understanding. D/c education completed with patient/family including follow up instructions, medication list, d/c activities limitations if indicated, with other d/c instructions as indicated by MD - patient able to verbalize understanding, all questions fully answered. Patient instructed to return to ED, call 911, or call MD for any changes in condition.  Patient escorted via WC, and D/C home via private auto.  Anakaren Campion,  Gretta Cool, RN 12/01/2016 5:06 PM

## 2016-12-02 ENCOUNTER — Telehealth: Payer: Self-pay | Admitting: *Deleted

## 2016-12-05 DIAGNOSIS — M810 Age-related osteoporosis without current pathological fracture: Secondary | ICD-10-CM | POA: Diagnosis not present

## 2016-12-05 DIAGNOSIS — E1142 Type 2 diabetes mellitus with diabetic polyneuropathy: Secondary | ICD-10-CM | POA: Diagnosis not present

## 2016-12-05 DIAGNOSIS — Z794 Long term (current) use of insulin: Secondary | ICD-10-CM | POA: Diagnosis not present

## 2016-12-05 DIAGNOSIS — K219 Gastro-esophageal reflux disease without esophagitis: Secondary | ICD-10-CM | POA: Diagnosis not present

## 2016-12-05 DIAGNOSIS — S32020D Wedge compression fracture of second lumbar vertebra, subsequent encounter for fracture with routine healing: Secondary | ICD-10-CM | POA: Diagnosis not present

## 2016-12-05 DIAGNOSIS — M5441 Lumbago with sciatica, right side: Secondary | ICD-10-CM | POA: Diagnosis not present

## 2016-12-05 DIAGNOSIS — G4730 Sleep apnea, unspecified: Secondary | ICD-10-CM | POA: Diagnosis not present

## 2016-12-05 DIAGNOSIS — F4310 Post-traumatic stress disorder, unspecified: Secondary | ICD-10-CM | POA: Diagnosis not present

## 2016-12-22 ENCOUNTER — Ambulatory Visit (INDEPENDENT_AMBULATORY_CARE_PROVIDER_SITE_OTHER): Payer: BLUE CROSS/BLUE SHIELD | Admitting: Family Medicine

## 2016-12-22 DIAGNOSIS — M5441 Lumbago with sciatica, right side: Secondary | ICD-10-CM | POA: Diagnosis not present

## 2016-12-22 DIAGNOSIS — M810 Age-related osteoporosis without current pathological fracture: Secondary | ICD-10-CM | POA: Diagnosis not present

## 2016-12-22 DIAGNOSIS — S32020D Wedge compression fracture of second lumbar vertebra, subsequent encounter for fracture with routine healing: Secondary | ICD-10-CM | POA: Diagnosis not present

## 2016-12-22 DIAGNOSIS — Z794 Long term (current) use of insulin: Secondary | ICD-10-CM | POA: Diagnosis not present

## 2016-12-22 DIAGNOSIS — G219 Secondary parkinsonism, unspecified: Secondary | ICD-10-CM

## 2016-12-22 DIAGNOSIS — F4310 Post-traumatic stress disorder, unspecified: Secondary | ICD-10-CM | POA: Diagnosis not present

## 2016-12-22 DIAGNOSIS — G4730 Sleep apnea, unspecified: Secondary | ICD-10-CM | POA: Diagnosis not present

## 2016-12-22 DIAGNOSIS — E1142 Type 2 diabetes mellitus with diabetic polyneuropathy: Secondary | ICD-10-CM | POA: Diagnosis not present

## 2016-12-22 DIAGNOSIS — K219 Gastro-esophageal reflux disease without esophagitis: Secondary | ICD-10-CM | POA: Diagnosis not present

## 2016-12-26 ENCOUNTER — Other Ambulatory Visit: Payer: Self-pay | Admitting: *Deleted

## 2016-12-26 MED ORDER — INSULIN ASPART 100 UNIT/ML FLEXPEN: PEN_INJECTOR | SUBCUTANEOUS | 0 refills | Status: DC

## 2016-12-29 DIAGNOSIS — K219 Gastro-esophageal reflux disease without esophagitis: Secondary | ICD-10-CM | POA: Diagnosis not present

## 2016-12-29 DIAGNOSIS — M810 Age-related osteoporosis without current pathological fracture: Secondary | ICD-10-CM | POA: Diagnosis not present

## 2016-12-29 DIAGNOSIS — G4730 Sleep apnea, unspecified: Secondary | ICD-10-CM | POA: Diagnosis not present

## 2016-12-29 DIAGNOSIS — M5441 Lumbago with sciatica, right side: Secondary | ICD-10-CM | POA: Diagnosis not present

## 2016-12-29 DIAGNOSIS — S32020D Wedge compression fracture of second lumbar vertebra, subsequent encounter for fracture with routine healing: Secondary | ICD-10-CM | POA: Diagnosis not present

## 2016-12-29 DIAGNOSIS — Z794 Long term (current) use of insulin: Secondary | ICD-10-CM | POA: Diagnosis not present

## 2016-12-29 DIAGNOSIS — E1142 Type 2 diabetes mellitus with diabetic polyneuropathy: Secondary | ICD-10-CM | POA: Diagnosis not present

## 2016-12-29 DIAGNOSIS — F4310 Post-traumatic stress disorder, unspecified: Secondary | ICD-10-CM | POA: Diagnosis not present

## 2017-01-01 ENCOUNTER — Telehealth: Payer: Self-pay

## 2017-01-01 ENCOUNTER — Telehealth: Payer: Self-pay | Admitting: Family Medicine

## 2017-01-01 NOTE — Telephone Encounter (Signed)
Wife states that his Novolog rx is written wrong. Stating pt needs 3 pens per day. Instructions on the rx is take 5-7 units depending on carb intake- per office note on 08/28/16 - takes 5-7 units TID and if has a snack then four times a day. Spoke with [facility] and they state that the rx needs to say- 5-7 units depending on carb intake, max of 28 units per day. If approved to change please send in new rx.

## 2017-01-02 ENCOUNTER — Ambulatory Visit: Payer: Medicare Other | Admitting: Family Medicine

## 2017-01-02 MED ORDER — INSULIN ASPART 100 UNIT/ML FLEXPEN: PEN_INJECTOR | SUBCUTANEOUS | 0 refills | Status: DC

## 2017-01-02 NOTE — Telephone Encounter (Signed)
Rx sent 

## 2017-01-02 NOTE — Addendum Note (Signed)
Addended by: Angela AdamOSTOSKY, Evanna Washinton C on: 01/02/2017 08:12 AM   Modules accepted: Orders

## 2017-01-02 NOTE — Telephone Encounter (Signed)
Yes go ahead and change this and send it in for him.

## 2017-01-06 DIAGNOSIS — E119 Type 2 diabetes mellitus without complications: Secondary | ICD-10-CM | POA: Diagnosis not present

## 2017-01-06 DIAGNOSIS — Z794 Long term (current) use of insulin: Secondary | ICD-10-CM | POA: Diagnosis not present

## 2017-01-23 ENCOUNTER — Other Ambulatory Visit: Payer: Self-pay | Admitting: Family Medicine

## 2017-01-26 ENCOUNTER — Telehealth: Payer: Self-pay | Admitting: Family Medicine

## 2017-01-26 NOTE — Telephone Encounter (Signed)
FYI Do you want me to call and schedule appt or [facility]

## 2017-01-28 NOTE — Telephone Encounter (Signed)
[name], [facility] 01/28/2017, 8:01 AM

## 2017-01-31 ENCOUNTER — Other Ambulatory Visit: Payer: Self-pay | Admitting: *Deleted

## 2017-01-31 DIAGNOSIS — E1165 Type 2 diabetes mellitus with hyperglycemia: Secondary | ICD-10-CM

## 2017-01-31 DIAGNOSIS — Z794 Long term (current) use of insulin: Principal | ICD-10-CM

## 2017-01-31 MED ORDER — INSULIN GLARGINE 100 UNIT/ML SOLOSTAR PEN: 20.0000 [IU] | PEN_INJECTOR | Freq: Every morning | SUBCUTANEOUS | 0 refills | Status: DC

## 2017-01-31 NOTE — Progress Notes (Signed)
Patient needing samples until mail order comes in. Lantus samples given.

## 2017-02-11 ENCOUNTER — Other Ambulatory Visit: Payer: Self-pay | Admitting: *Deleted

## 2017-02-11 DIAGNOSIS — E1165 Type 2 diabetes mellitus with hyperglycemia: Secondary | ICD-10-CM

## 2017-02-11 DIAGNOSIS — Z794 Long term (current) use of insulin: Principal | ICD-10-CM

## 2017-02-11 MED ORDER — INSULIN ASPART 100 UNIT/ML FLEXPEN: PEN_INJECTOR | SUBCUTANEOUS | 0 refills | Status: DC

## 2017-02-11 MED ORDER — INSULIN GLARGINE 100 UNIT/ML SOLOSTAR PEN: PEN_INJECTOR | SUBCUTANEOUS | 0 refills | Status: DC

## 2017-03-11 NOTE — Telephone Encounter (Signed)
na

## 2017-04-01 ENCOUNTER — Other Ambulatory Visit: Payer: Self-pay | Admitting: Family Medicine

## 2017-04-01 DIAGNOSIS — E1165 Type 2 diabetes mellitus with hyperglycemia: Secondary | ICD-10-CM

## 2017-04-01 DIAGNOSIS — Z794 Long term (current) use of insulin: Principal | ICD-10-CM

## 2017-04-02 NOTE — Telephone Encounter (Signed)
Last seen 08/28/16  Dr Dettinger

## 2017-04-02 NOTE — Telephone Encounter (Signed)
Give him one month, but he needs to be seen asap Arville CareJoshua Ananya Mccleese, MD [facility] 04/02/2017, 9:57 AM

## 2017-05-20 DIAGNOSIS — M545 Low back pain: Secondary | ICD-10-CM | POA: Diagnosis not present

## 2017-05-20 DIAGNOSIS — E1140 Type 2 diabetes mellitus with diabetic neuropathy, unspecified: Secondary | ICD-10-CM | POA: Diagnosis not present

## 2017-05-20 DIAGNOSIS — K219 Gastro-esophageal reflux disease without esophagitis: Secondary | ICD-10-CM | POA: Diagnosis not present

## 2017-05-20 DIAGNOSIS — I10 Essential (primary) hypertension: Secondary | ICD-10-CM | POA: Diagnosis not present

## 2017-05-21 DIAGNOSIS — F329 Major depressive disorder, single episode, unspecified: Secondary | ICD-10-CM | POA: Diagnosis not present

## 2017-05-21 DIAGNOSIS — H16142 Punctate keratitis, left eye: Secondary | ICD-10-CM | POA: Diagnosis not present

## 2017-05-21 DIAGNOSIS — F4310 Post-traumatic stress disorder, unspecified: Secondary | ICD-10-CM | POA: Diagnosis not present

## 2017-05-21 DIAGNOSIS — F4542 Pain disorder with related psychological factors: Secondary | ICD-10-CM | POA: Diagnosis not present

## 2017-05-21 DIAGNOSIS — G8929 Other chronic pain: Secondary | ICD-10-CM | POA: Diagnosis not present

## 2017-05-21 DIAGNOSIS — H50112 Monocular exotropia, left eye: Secondary | ICD-10-CM | POA: Diagnosis not present

## 2017-05-29 ENCOUNTER — Other Ambulatory Visit: Payer: Self-pay | Admitting: Surgery

## 2017-06-01 DIAGNOSIS — Z794 Long term (current) use of insulin: Secondary | ICD-10-CM | POA: Diagnosis not present

## 2017-06-01 DIAGNOSIS — E119 Type 2 diabetes mellitus without complications: Secondary | ICD-10-CM | POA: Diagnosis not present

## 2017-06-05 ENCOUNTER — Encounter (HOSPITAL_BASED_OUTPATIENT_CLINIC_OR_DEPARTMENT_OTHER): Payer: Self-pay | Admitting: *Deleted

## 2017-06-05 NOTE — Progress Notes (Signed)
Pt may have clear liquids dos until 1000. Will recheck cbc with bmet. H/o low plt ct 11/30/16 per Dr Acey Lav.

## 2017-06-11 ENCOUNTER — Encounter (HOSPITAL_BASED_OUTPATIENT_CLINIC_OR_DEPARTMENT_OTHER)
Admission: RE | Admit: 2017-06-11 | Discharge: 2017-06-11 | Disposition: A | Discharge status: Home / Self Care | Payer: BLUE CROSS/BLUE SHIELD | Source: Ambulatory Visit | Attending: Surgery | Admitting: Surgery

## 2017-06-11 ENCOUNTER — Encounter (HOSPITAL_BASED_OUTPATIENT_CLINIC_OR_DEPARTMENT_OTHER): Payer: Self-pay

## 2017-06-11 DIAGNOSIS — L723 Sebaceous cyst: Secondary | ICD-10-CM | POA: Diagnosis present

## 2017-06-11 DIAGNOSIS — Z79899 Other long term (current) drug therapy: Secondary | ICD-10-CM | POA: Diagnosis not present

## 2017-06-11 DIAGNOSIS — Z794 Long term (current) use of insulin: Secondary | ICD-10-CM | POA: Diagnosis not present

## 2017-06-11 DIAGNOSIS — Z79891 Long term (current) use of opiate analgesic: Secondary | ICD-10-CM | POA: Diagnosis not present

## 2017-06-11 DIAGNOSIS — K219 Gastro-esophageal reflux disease without esophagitis: Secondary | ICD-10-CM | POA: Diagnosis not present

## 2017-06-11 DIAGNOSIS — E559 Vitamin D deficiency, unspecified: Secondary | ICD-10-CM | POA: Diagnosis not present

## 2017-06-11 DIAGNOSIS — E119 Type 2 diabetes mellitus without complications: Secondary | ICD-10-CM | POA: Diagnosis not present

## 2017-06-11 DIAGNOSIS — G4730 Sleep apnea, unspecified: Secondary | ICD-10-CM | POA: Diagnosis not present

## 2017-06-11 DIAGNOSIS — L720 Epidermal cyst: Secondary | ICD-10-CM | POA: Diagnosis not present

## 2017-06-11 DIAGNOSIS — I10 Essential (primary) hypertension: Secondary | ICD-10-CM | POA: Diagnosis not present

## 2017-06-11 LAB — CBC
HCT: 48.6 % (ref 39.0–52.0)
Hemoglobin: 16.4 g/dL (ref 13.0–17.0)
MCH: 35 pg — ABNORMAL HIGH (ref 26.0–34.0)
MCHC: 33.7 g/dL (ref 30.0–36.0)
MCV: 103.6 fL — ABNORMAL HIGH (ref 78.0–100.0)
Platelets: 100 10*3/uL — ABNORMAL LOW (ref 150–400)
RBC: 4.69 MIL/uL (ref 4.22–5.81)
RDW: 14.1 % (ref 11.5–15.5)
WBC: 5.9 10*3/uL (ref 4.0–10.5)

## 2017-06-11 LAB — BASIC METABOLIC PANEL
Anion gap: 13 (ref 5–15)
BUN: 8 mg/dL (ref 6–20)
CHLORIDE: 93 mmol/L — AB (ref 101–111)
CO2: 28 mmol/L (ref 22–32)
CREATININE: 0.66 mg/dL (ref 0.61–1.24)
Calcium: 8.8 mg/dL — ABNORMAL LOW (ref 8.9–10.3)
Glucose, Bld: 188 mg/dL — ABNORMAL HIGH (ref 65–99)
Potassium: 4.1 mmol/L (ref 3.5–5.1)
Sodium: 134 mmol/L — ABNORMAL LOW (ref 135–145)

## 2017-06-11 NOTE — H&P (Signed)
[name] 05/29/2017 11:15 AM Location: [facility] Patient #: [id number] DOB: [birth year] Undefined / Language: [place] / Race: White Male   History of Present Illness [name] Lam A. Magnus Ivan MD; 05/29/2017 11:25 AM) The patient is a 56 year old male who presents with a complaint of Mass. This patient was referred by Bebe Liter, NP from the [facility] for evaluation of a left axillary cyst. The patient has multiple medical issues including diabetes. The cyst appeared possibly 2 years ago. It is now getting larger and causing pain. It is been infected in the past and has had drained spontaneously in the past as well. He currently has no other complaints regarding the axilla. He has had no previous infected cyst anywhere else on the body.  PMH: Past Medical History:  Diagnosis Date  . Chronic lower back pain   . Compression fracture    lumbar 3  . Diabetes mellitus   . Fracture acetabulum-closed (HCC) 04/09/2013  . GERD (gastroesophageal reflux disease)   . H/O Legionnaire's disease   . Hepatomegaly   . Hypertension   . Migraine   . Osteoporosis   . Pancreatitis   . PTSD (post-traumatic stress disorder)   . Sleep apnea    does not wear CPAP  . Vitamin D deficiency     PSH:      Past Surgical History:  Procedure Laterality Date  . BACK SURGERY    . CHOLECYSTECTOMY    . HERNIA REPAIR    . KYPHOPLASTY N/A 07/16/2015   Procedure: Lumbar three kyphoplasty;  Surgeon: Coletta Memos, MD;  Location: [facility];  Service: Neurosurgery;  Laterality: N/A;  Lumbar three kyphoplasty  . LAMINECTOMY    . STERNUM FRACTURE SURGERY      Allergies Doristine Devoid, CMA; 05/29/2017 11:17 AM) Ketorolac *ANALGESICS - ANTI-INFLAMMATORY*   Medication History (Chemira Jones, CMA; 05/29/2017 11:20 AM) NovoLOG FlexPen (100UNIT/ML Soln Pen-inj, Subcutaneous) Active. Potassium Chloride Crys ER ( Tablet ER, Oral) Active. Albuterol Sulfate HFA (108  (90 Base)MCG/ACT Aerosol Soln, Inhalation) Active. Calcium-Vitamin D (500MG  Capsule, Oral) Active. Cyanocobalamin ( Tablet, Oral) Active. Vitamin D (Ergocalciferol) (50000UNIT Capsule, Oral) Active. Gabapentin (400MG  Capsule, Oral) Active. Guaifenesin (600MG  Tablet ER, Oral) Active. Lantus SoloStar (100UNIT/ML Solution, Subcutaneous) Active. OxyCODONE HCl (5MG  Tablet, Oral) Active. Pantoprazole Sodium (40MG  Tablet DR, Oral) Active. MiraLax (Oral) Active. Forteo (600MCG/2.4ML Solution, Subcutaneous) Active. Medications Reconciled  Vitals (Chemira Jones CMA; 05/29/2017 11:17 AM) 05/29/2017 11:16 AM Weight: 170 lb Height: 67in Body Surface Area: 1.89 m Body Mass Index: 26.63 kg/m  Temp.: 98.65F(Oral)  Pulse: 103 (Regular)  BP: 122/82 (Sitting, Left Arm, Standard)       Physical Exam (Jamileth Putzier A. Magnus Ivan MD; 05/29/2017 11:26 AM) The physical exam findings are as follows: Note:Generally, he is walking with a walker. He has a back brace in place. Lungs are clear bilaterally Cardiovascular is regular rate and rhythm There is a 2 cm superficial but tender mass in the left axilla consistent with a chronically infected sebaceous cyst. There is no current erythema or drainage. There is no adenopathy. It is mobile.    Assessment & Plan (Demetrio Leighty A. Magnus Ivan MD; 05/29/2017 11:27 AM) INFECTED SEBACEOUS CYST (L72.3) Impression: I discussed the diagnosis in detail the patient and his wife. Because of his discomfort, previous infections, and diabetes, excision of the cyst is recommended to prevent ongoing infections and for histologic evaluation to rule out malignancy. I discussed the procedure in detail. I discussed the risks which includes but is not limited to bleeding,  infection, recurrence, etc. They understand and wish to proceed with surgery

## 2017-06-11 NOTE — Progress Notes (Addendum)
Lab result- platelet count 100[facility].   Dr. Acey Lavarignan notified also of platelet count.

## 2017-06-12 ENCOUNTER — Ambulatory Visit (HOSPITAL_BASED_OUTPATIENT_CLINIC_OR_DEPARTMENT_OTHER): Payer: BLUE CROSS/BLUE SHIELD | Admitting: Anesthesiology

## 2017-06-12 ENCOUNTER — Encounter (HOSPITAL_BASED_OUTPATIENT_CLINIC_OR_DEPARTMENT_OTHER)
Admission: RE | Disposition: A | Discharge status: Home / Self Care | Payer: Self-pay | Source: Ambulatory Visit | Attending: Surgery

## 2017-06-12 ENCOUNTER — Ambulatory Visit (HOSPITAL_BASED_OUTPATIENT_CLINIC_OR_DEPARTMENT_OTHER)
Admission: RE | Admit: 2017-06-12 | Discharge: 2017-06-12 | Disposition: A | Discharge status: Home / Self Care | Payer: BLUE CROSS/BLUE SHIELD | Source: Ambulatory Visit | Attending: Surgery | Admitting: Surgery

## 2017-06-12 ENCOUNTER — Encounter (HOSPITAL_BASED_OUTPATIENT_CLINIC_OR_DEPARTMENT_OTHER): Payer: Self-pay | Admitting: *Deleted

## 2017-06-12 DIAGNOSIS — Z79899 Other long term (current) drug therapy: Secondary | ICD-10-CM | POA: Insufficient documentation

## 2017-06-12 DIAGNOSIS — K219 Gastro-esophageal reflux disease without esophagitis: Secondary | ICD-10-CM | POA: Diagnosis not present

## 2017-06-12 DIAGNOSIS — I10 Essential (primary) hypertension: Secondary | ICD-10-CM | POA: Insufficient documentation

## 2017-06-12 DIAGNOSIS — Z794 Long term (current) use of insulin: Secondary | ICD-10-CM | POA: Diagnosis not present

## 2017-06-12 DIAGNOSIS — E119 Type 2 diabetes mellitus without complications: Secondary | ICD-10-CM | POA: Diagnosis not present

## 2017-06-12 DIAGNOSIS — L720 Epidermal cyst: Secondary | ICD-10-CM | POA: Diagnosis not present

## 2017-06-12 DIAGNOSIS — L723 Sebaceous cyst: Secondary | ICD-10-CM | POA: Diagnosis not present

## 2017-06-12 DIAGNOSIS — G4730 Sleep apnea, unspecified: Secondary | ICD-10-CM | POA: Insufficient documentation

## 2017-06-12 DIAGNOSIS — E559 Vitamin D deficiency, unspecified: Secondary | ICD-10-CM | POA: Insufficient documentation

## 2017-06-12 DIAGNOSIS — Z79891 Long term (current) use of opiate analgesic: Secondary | ICD-10-CM | POA: Diagnosis not present

## 2017-06-12 HISTORY — PX: MASS EXCISION: SHX2000

## 2017-06-12 LAB — GLUCOSE, CAPILLARY
Glucose-Capillary: 126 mg/dL — ABNORMAL HIGH (ref 65–99)
Glucose-Capillary: 131 mg/dL — ABNORMAL HIGH (ref 65–99)

## 2017-06-12 SURGERY — EXCISION MASS
Anesthesia: General | Site: Axilla | Laterality: Left

## 2017-06-12 MED ORDER — SODIUM CHLORIDE 0.9 % IV SOLN: 250.0000 mL | INTRAVENOUS | Status: DC | PRN

## 2017-06-12 MED ORDER — CEFAZOLIN SODIUM-DEXTROSE 2-4 GM/100ML-% IV SOLN
2.0000 g | INTRAVENOUS | Status: AC
  Administered 2017-06-12: 2 g via INTRAVENOUS

## 2017-06-12 MED ORDER — ACETAMINOPHEN 500 MG PO TABS
1000.0000 mg | ORAL_TABLET | ORAL | Status: AC
  Administered 2017-06-12: 1000 mg via ORAL

## 2017-06-12 MED ORDER — LACTATED RINGERS IV SOLN
INTRAVENOUS | Status: DC
  Administered 2017-06-12: 14:00:00 via INTRAVENOUS

## 2017-06-12 MED ORDER — SODIUM CHLORIDE 0.9% FLUSH: 3.0000 mL | INTRAVENOUS | Status: DC | PRN

## 2017-06-12 MED ORDER — HYDROMORPHONE HCL 1 MG/ML IJ SOLN: 0.2500 mg | INTRAMUSCULAR | Status: DC | PRN

## 2017-06-12 MED ORDER — DEXAMETHASONE SODIUM PHOSPHATE 10 MG/ML IJ SOLN
INTRAMUSCULAR | Status: AC
  Filled 2017-06-12: qty 1

## 2017-06-12 MED ORDER — OXYCODONE HCL 5 MG PO TABS: 5.0000 mg | ORAL_TABLET | ORAL | Status: DC | PRN

## 2017-06-12 MED ORDER — DEXAMETHASONE SODIUM PHOSPHATE 4 MG/ML IJ SOLN
INTRAMUSCULAR | Status: DC | PRN
  Administered 2017-06-12: 10 mg via INTRAVENOUS

## 2017-06-12 MED ORDER — LIDOCAINE 2% (20 MG/ML) 5 ML SYRINGE
INTRAMUSCULAR | Status: DC | PRN
  Administered 2017-06-12: 40 mg via INTRAVENOUS

## 2017-06-12 MED ORDER — GABAPENTIN 300 MG PO CAPS: 300.0000 mg | ORAL_CAPSULE | ORAL | Status: DC

## 2017-06-12 MED ORDER — SCOPOLAMINE 1 MG/3DAYS TD PT72: 1.0000 | MEDICATED_PATCH | Freq: Once | TRANSDERMAL | Status: DC | PRN

## 2017-06-12 MED ORDER — GABAPENTIN 300 MG PO CAPS
ORAL_CAPSULE | ORAL | Status: AC
  Filled 2017-06-12: qty 1

## 2017-06-12 MED ORDER — ONDANSETRON HCL 4 MG/2ML IJ SOLN
INTRAMUSCULAR | Status: DC | PRN
  Administered 2017-06-12: 4 mg via INTRAVENOUS

## 2017-06-12 MED ORDER — OXYCODONE HCL 5 MG PO TABS: 5.0000 mg | ORAL_TABLET | ORAL | 0 refills | Status: DC | PRN

## 2017-06-12 MED ORDER — PROPOFOL 10 MG/ML IV BOLUS
INTRAVENOUS | Status: DC | PRN
  Administered 2017-06-12: 200 mg via INTRAVENOUS

## 2017-06-12 MED ORDER — ONDANSETRON HCL 4 MG/2ML IJ SOLN
INTRAMUSCULAR | Status: AC
  Filled 2017-06-12: qty 2

## 2017-06-12 MED ORDER — CHLORHEXIDINE GLUCONATE CLOTH 2 % EX PADS: 6.0000 | MEDICATED_PAD | Freq: Once | CUTANEOUS | Status: DC

## 2017-06-12 MED ORDER — CEFAZOLIN SODIUM-DEXTROSE 2-4 GM/100ML-% IV SOLN
INTRAVENOUS | Status: AC
  Filled 2017-06-12: qty 100

## 2017-06-12 MED ORDER — LIDOCAINE 2% (20 MG/ML) 5 ML SYRINGE
INTRAMUSCULAR | Status: AC
  Filled 2017-06-12: qty 5

## 2017-06-12 MED ORDER — SODIUM CHLORIDE 0.9% FLUSH: 3.0000 mL | Freq: Two times a day (BID) | INTRAVENOUS | Status: DC

## 2017-06-12 MED ORDER — ACETAMINOPHEN 325 MG PO TABS: 650.0000 mg | ORAL_TABLET | ORAL | Status: DC | PRN

## 2017-06-12 MED ORDER — MIDAZOLAM HCL 2 MG/2ML IJ SOLN: 1.0000 mg | INTRAMUSCULAR | Status: DC | PRN

## 2017-06-12 MED ORDER — PROMETHAZINE HCL 25 MG/ML IJ SOLN: 6.2500 mg | INTRAMUSCULAR | Status: DC | PRN

## 2017-06-12 MED ORDER — BUPIVACAINE-EPINEPHRINE 0.5% -1:200000 IJ SOLN
INTRAMUSCULAR | Status: DC | PRN
  Administered 2017-06-12: 20 mL

## 2017-06-12 MED ORDER — 0.9 % SODIUM CHLORIDE (POUR BTL) OPTIME
TOPICAL | Status: DC | PRN
  Administered 2017-06-12: 1000 mL

## 2017-06-12 MED ORDER — MORPHINE SULFATE (PF) 2 MG/ML IV SOLN: 1.0000 mg | INTRAVENOUS | Status: DC | PRN

## 2017-06-12 MED ORDER — FENTANYL CITRATE (PF) 100 MCG/2ML IJ SOLN
50.0000 ug | INTRAMUSCULAR | Status: DC | PRN
  Administered 2017-06-12: 100 ug via INTRAVENOUS

## 2017-06-12 MED ORDER — ACETAMINOPHEN 650 MG RE SUPP: 650.0000 mg | RECTAL | Status: DC | PRN

## 2017-06-12 MED ORDER — FENTANYL CITRATE (PF) 100 MCG/2ML IJ SOLN
INTRAMUSCULAR | Status: AC
  Filled 2017-06-12: qty 2

## 2017-06-12 MED ORDER — ACETAMINOPHEN 500 MG PO TABS
ORAL_TABLET | ORAL | Status: AC
  Filled 2017-06-12: qty 2

## 2017-06-12 SURGICAL SUPPLY — 33 items
ADH SKN CLS APL DERMABOND .7 (GAUZE/BANDAGES/DRESSINGS) ×1
BLADE HEX COATED 2.75 (ELECTRODE) ×3 IMPLANT
BLADE SURG 15 STRL LF DISP TIS (BLADE) ×1 IMPLANT
BLADE SURG 15 STRL SS (BLADE) ×3
CHLORAPREP W/TINT 26ML (MISCELLANEOUS) ×3 IMPLANT
COVER BACK TABLE 60X90IN (DRAPES) ×3 IMPLANT
COVER MAYO STAND STRL (DRAPES) ×3 IMPLANT
DERMABOND ADVANCED (GAUZE/BANDAGES/DRESSINGS) ×2
DERMABOND ADVANCED .7 DNX12 (GAUZE/BANDAGES/DRESSINGS) ×1 IMPLANT
DRAPE LAPAROTOMY 100X72 PEDS (DRAPES) ×3 IMPLANT
DRAPE UTILITY XL STRL (DRAPES) ×3 IMPLANT
ELECT REM PT RETURN 9FT ADLT (ELECTROSURGICAL) ×3
ELECTRODE REM PT RTRN 9FT ADLT (ELECTROSURGICAL) ×1 IMPLANT
GLOVE BIOGEL PI IND STRL 7.0 (GLOVE) IMPLANT
GLOVE BIOGEL PI INDICATOR 7.0 (GLOVE) ×2
GLOVE ECLIPSE 6.5 STRL STRAW (GLOVE) ×3 IMPLANT
GLOVE SURG SIGNA 7.5 PF LTX (GLOVE) ×3 IMPLANT
GOWN STRL REUS W/ TWL LRG LVL3 (GOWN DISPOSABLE) ×1 IMPLANT
GOWN STRL REUS W/ TWL XL LVL3 (GOWN DISPOSABLE) ×1 IMPLANT
GOWN STRL REUS W/TWL LRG LVL3 (GOWN DISPOSABLE) ×3
GOWN STRL REUS W/TWL XL LVL3 (GOWN DISPOSABLE) ×3
NEEDLE HYPO 25X1 1.5 SAFETY (NEEDLE) ×3 IMPLANT
NS IRRIG 1000ML POUR BTL (IV SOLUTION) ×3 IMPLANT
PACK BASIN DAY SURGERY FS (CUSTOM PROCEDURE TRAY) ×3 IMPLANT
PENCIL BUTTON HOLSTER BLD 10FT (ELECTRODE) ×3 IMPLANT
SLEEVE SCD COMPRESS KNEE MED (MISCELLANEOUS) ×3 IMPLANT
SPONGE LAP 4X18 X RAY DECT (DISPOSABLE) ×3 IMPLANT
SUT MON AB 4-0 PC3 18 (SUTURE) ×2 IMPLANT
SUT VIC AB 3-0 SH 27 (SUTURE) ×3
SUT VIC AB 3-0 SH 27X BRD (SUTURE) ×1 IMPLANT
SYR BULB 3OZ (MISCELLANEOUS) ×3 IMPLANT
SYR CONTROL 10ML LL (SYRINGE) ×3 IMPLANT
TOWEL OR NON WOVEN STRL DISP B (DISPOSABLE) ×3 IMPLANT

## 2017-06-12 NOTE — Anesthesia Postprocedure Evaluation (Signed)
Anesthesia Post Note  Patient: [name]  Procedure(s) Performed: EXCISION LEFT AXILLARY SEBACEOUS CYST (Left Axilla)     Patient location during evaluation: PACU Anesthesia Type: General Level of consciousness: sedated Pain management: pain level controlled Vital Signs Assessment: post-procedure vital signs reviewed and stable Respiratory status: spontaneous breathing and respiratory function stable Cardiovascular status: stable Postop Assessment: no apparent nausea or vomiting Anesthetic complications: no    Last Vitals:  Vitals:   06/12/17 1500 06/12/17 1515  BP: 116/83 118/85  Pulse: 83 81  Resp: 18 17  Temp:    SpO2: 96% 96%    Last Pain:  Vitals:   06/12/17 1515  TempSrc:   PainSc: 0-No pain                 Colson Barco DANIEL

## 2017-06-12 NOTE — Interval H&P Note (Signed)
History and Physical Interval Note: no change in H and P  06/12/2017 1:46 PM  [name]  has presented today for surgery, with the diagnosis of CHRONICALLY INFECTED SEBACEOUS CYST, LEFT AXILLA   The various methods of treatment have been discussed with the patient and family. After consideration of risks, benefits and other options for treatment, the patient has consented to  Procedure(s): EXCISION LEFT AXILLARY SEBACEOUS CYST (Left) as a surgical intervention .  The patient's history has been reviewed, patient examined, no change in status, stable for surgery.  I have reviewed the patient's chart and labs.  Questions were answered to the patient's satisfaction.     [name]

## 2017-06-12 NOTE — Anesthesia Preprocedure Evaluation (Addendum)
Anesthesia Evaluation  Patient identified by MRN, date of birth, ID band Patient awake    Reviewed: Allergy & Precautions, NPO status , Patient's Chart, lab work & pertinent test results  History of Anesthesia Complications Negative for: history of anesthetic complications  Airway Mallampati: II  TM Distance: >3 FB Neck ROM: Full    Dental no notable dental hx. (+) Poor Dentition, Dental Advisory Given   Pulmonary sleep apnea , former smoker,  Hx of legionella PNA s/p intubation and ICU stay, wife reports he is now asymptomatic and has recovered well   Pulmonary exam normal        Cardiovascular hypertension, Pt. on medications Normal cardiovascular exam     Neuro/Psych  Headaches, PSYCHIATRIC DISORDERS (PTSD) Anxiety    GI/Hepatic Neg liver ROS, GERD  Medicated and Controlled,  Endo/Other  diabetes, Type 2, Insulin Dependent  Renal/GU negative Renal ROS  negative genitourinary   Musculoskeletal negative musculoskeletal ROS (+)   Abdominal   Peds negative pediatric ROS (+)  Hematology negative hematology ROS (+)   Anesthesia Other Findings   Reproductive/Obstetrics negative OB ROS                            Anesthesia Physical  Anesthesia Plan  ASA: III  Anesthesia Plan: General   Post-op Pain Management:    Induction: Intravenous  PONV Risk Score and Plan: 2 and Ondansetron and Dexamethasone  Airway Management Planned: LMA  Additional Equipment:   Intra-op Plan:   Post-operative Plan: Extubation in OR  Informed Consent: I have reviewed the patients History and Physical, chart, labs and discussed the procedure including the risks, benefits and alternatives for the proposed anesthesia with the patient or authorized representative who has indicated his/her understanding and acceptance.   Dental advisory given  Plan Discussed with: Surgeon, Anesthesiologist and  CRNA  Anesthesia Plan Comments:        Anesthesia Quick Evaluation

## 2017-06-12 NOTE — Op Note (Signed)
EXCISION LEFT AXILLARY SEBACEOUS CYST  Procedure Note  [name] 06/12/2017   Pre-op Diagnosis: CHRONICALLY INFECTED SEBACEOUS CYST, LEFT AXILLA      Post-op Diagnosis: same  Procedure(s): EXCISION LEFT AXILLARY SEBACEOUS CYST ( 2.5 cm)  Surgeon(s): Abigail MiyamotoBlackman, Nasya Vincent, MD  Anesthesia: General  Staff:  Circulator: Julieta BelliniHaralam, Jamie S, RN Scrub Person: Neiers, Flor M, CST  Estimated Blood Loss: Minimal               Specimens: sent to path  Findings: The patient had [name] large 2.5 cm cyst in the axilla consistent with [name] sebaceous cyst  Procedure: The patient was brought to the operating room and identified as the correct patient. He was placed supine on the operating table and general anesthesia was induced. His left axilla was then prepped and draped in the usual sterile fashion. I anesthetized skin around the visible cyst with Marcaine.  I then made an elliptical incision around the cyst including the overlying skin. I took this down into the subcutaneous tissue with electrocautery and excised the cyst in its entirety including the capsule and sebaceous debris in the cyst. It was sent to pathology for evaluation. I achieved hemostasis with the cautery. I anesthetized the wound further with Marcaine. I then closed the subcutaneous tissue with interrupted 3-0 Vicryl sutures and closed the skin with [name] running 4-0 Monocryl. Dermabond was then applied. The patient tolerated the procedure well. All the counts were correct at the end of the procedure. The patient was then extubated in the operating room and taken in [name] stable condition to the recovery room.          [name]   Date: 06/12/2017  Time: 2:31 PM

## 2017-06-12 NOTE — Transfer of Care (Signed)
Immediate Anesthesia Transfer of Care Note  Patient: [name]  Procedure(s) Performed: EXCISION LEFT AXILLARY SEBACEOUS CYST (Left Axilla)  Patient Location: PACU  Anesthesia Type:General  Level of Consciousness: awake and oriented  Airway & Oxygen Therapy: Patient Spontanous Breathing and Patient connected to face mask oxygen  Post-op Assessment: Report given to RN and Post -op Vital signs reviewed and stable  Post vital signs: Reviewed and stable  Last Vitals:  Vitals:   06/12/17 1340  BP: (!) 142/90  Pulse: 94  Resp: 18  Temp: 36.9 C  SpO2: 98%    Last Pain:  Vitals:   06/12/17 1340  TempSrc: Oral  PainSc: 8       Patients Stated Pain Goal: 4 (06/12/17 1340)  Complications: No apparent anesthesia complications

## 2017-06-12 NOTE — Discharge Instructions (Signed)
Ok to shower starting tomorrow  No vigorous activity for 1 week  Ice pack, tylenol, ibuprofen also for pain   Call your surgeon if you experience:   1.  Fever over 101.0. 2.  Inability to urinate. 3.  Nausea and/or vomiting. 4.  Extreme swelling or bruising at the surgical site. 5.  Continued bleeding from the incision. 6.  Increased pain, redness or drainage from the incision. 7.  Problems related to your pain medication. 8.  Any problems and/or concerns    Post Anesthesia Home Care Instructions  Activity: Get plenty of rest for the remainder of the day. A responsible individual must stay with you for 24 hours following the procedure.  For the next 24 hours, DO NOT: -Drive a car -Advertising copywriterperate machinery -Drink alcoholic beverages -Take any medication unless instructed by your physician -Make any legal decisions or sign important papers.  Meals: Start with liquid foods such as gelatin or soup. Progress to regular foods as tolerated. Avoid greasy, spicy, heavy foods. If nausea and/or vomiting occur, drink only clear liquids until the nausea and/or vomiting subsides. Call your physician if vomiting continues.  Special Instructions/Symptoms: Your throat may feel dry or sore from the anesthesia or the breathing tube placed in your throat during surgery. If this causes discomfort, gargle with warm salt water. The discomfort should disappear within 24 hours.  If you had a scopolamine patch placed behind your ear for the management of post- operative nausea and/or vomiting:  1. The medication in the patch is effective for 72 hours, after which it should be removed.  Wrap patch in a tissue and discard in the trash. Wash hands thoroughly with soap and water. 2. You may remove the patch earlier than 72 hours if you experience unpleasant side effects which may include dry mouth, dizziness or visual disturbances. 3. Avoid touching the patch. Wash your hands with soap and water after contact  with the patch.

## 2017-06-12 NOTE — Anesthesia Procedure Notes (Signed)
Procedure Name: LMA Insertion Performed by: Maliek Schellhorn W Pre-anesthesia Checklist: Patient identified, Emergency Drugs available, Suction available and Patient being monitored Patient Re-evaluated:Patient Re-evaluated prior to induction Oxygen Delivery Method: Circle system utilized Preoxygenation: Pre-oxygenation with 100% oxygen Induction Type: IV induction Ventilation: Mask ventilation without difficulty LMA: LMA inserted LMA Size: 5.0 Number of attempts: 1 Placement Confirmation: positive ETCO2 Tube secured with: Tape Dental Injury: Teeth and Oropharynx as per pre-operative assessment        

## 2017-06-15 ENCOUNTER — Encounter (HOSPITAL_BASED_OUTPATIENT_CLINIC_OR_DEPARTMENT_OTHER): Payer: Self-pay | Admitting: Surgery

## 2017-09-17 ENCOUNTER — Other Ambulatory Visit: Payer: Self-pay | Admitting: Family Medicine

## 2017-09-17 DIAGNOSIS — E1165 Type 2 diabetes mellitus with hyperglycemia: Secondary | ICD-10-CM

## 2017-09-17 DIAGNOSIS — Z794 Long term (current) use of insulin: Principal | ICD-10-CM

## 2017-10-16 ENCOUNTER — Ambulatory Visit (INDEPENDENT_AMBULATORY_CARE_PROVIDER_SITE_OTHER): Payer: Medicare HMO | Admitting: Physician Assistant

## 2017-10-16 ENCOUNTER — Other Ambulatory Visit: Payer: Self-pay

## 2017-10-16 ENCOUNTER — Emergency Department (HOSPITAL_COMMUNITY): Payer: Medicare HMO

## 2017-10-16 ENCOUNTER — Encounter (HOSPITAL_COMMUNITY): Payer: Self-pay

## 2017-10-16 ENCOUNTER — Inpatient Hospital Stay (HOSPITAL_COMMUNITY): Payer: Medicare HMO

## 2017-10-16 ENCOUNTER — Encounter: Payer: Self-pay | Admitting: Physician Assistant

## 2017-10-16 ENCOUNTER — Inpatient Hospital Stay (HOSPITAL_COMMUNITY)
Admission: EM | Admit: 2017-10-16 | Discharge: 2017-10-18 | DRG: 871 | Disposition: A | Discharge status: Home / Self Care | Payer: Medicare HMO | Attending: Family Medicine | Admitting: Family Medicine

## 2017-10-16 VITALS — BP 151/89 | HR 109 | Temp 97.1°F | Resp 22 | Ht 69.0 in | Wt 197.0 lb

## 2017-10-16 DIAGNOSIS — A419 Sepsis, unspecified organism: Principal | ICD-10-CM | POA: Diagnosis present

## 2017-10-16 DIAGNOSIS — E1142 Type 2 diabetes mellitus with diabetic polyneuropathy: Secondary | ICD-10-CM | POA: Diagnosis not present

## 2017-10-16 DIAGNOSIS — E871 Hypo-osmolality and hyponatremia: Secondary | ICD-10-CM | POA: Diagnosis present

## 2017-10-16 DIAGNOSIS — K7011 Alcoholic hepatitis with ascites: Secondary | ICD-10-CM | POA: Diagnosis present

## 2017-10-16 DIAGNOSIS — M810 Age-related osteoporosis without current pathological fracture: Secondary | ICD-10-CM | POA: Diagnosis present

## 2017-10-16 DIAGNOSIS — J101 Influenza due to other identified influenza virus with other respiratory manifestations: Secondary | ICD-10-CM

## 2017-10-16 DIAGNOSIS — S299XXA Unspecified injury of thorax, initial encounter: Secondary | ICD-10-CM | POA: Diagnosis not present

## 2017-10-16 DIAGNOSIS — J180 Bronchopneumonia, unspecified organism: Secondary | ICD-10-CM

## 2017-10-16 DIAGNOSIS — I152 Hypertension secondary to endocrine disorders: Secondary | ICD-10-CM | POA: Diagnosis present

## 2017-10-16 DIAGNOSIS — E1165 Type 2 diabetes mellitus with hyperglycemia: Secondary | ICD-10-CM | POA: Diagnosis not present

## 2017-10-16 DIAGNOSIS — J189 Pneumonia, unspecified organism: Secondary | ICD-10-CM

## 2017-10-16 DIAGNOSIS — K219 Gastro-esophageal reflux disease without esophagitis: Secondary | ICD-10-CM | POA: Diagnosis present

## 2017-10-16 DIAGNOSIS — R0602 Shortness of breath: Secondary | ICD-10-CM | POA: Diagnosis not present

## 2017-10-16 DIAGNOSIS — G8929 Other chronic pain: Secondary | ICD-10-CM | POA: Diagnosis present

## 2017-10-16 DIAGNOSIS — M545 Low back pain: Secondary | ICD-10-CM | POA: Diagnosis present

## 2017-10-16 DIAGNOSIS — I10 Essential (primary) hypertension: Secondary | ICD-10-CM | POA: Diagnosis present

## 2017-10-16 DIAGNOSIS — J9601 Acute respiratory failure with hypoxia: Secondary | ICD-10-CM

## 2017-10-16 DIAGNOSIS — R6889 Other general symptoms and signs: Secondary | ICD-10-CM | POA: Diagnosis not present

## 2017-10-16 DIAGNOSIS — R600 Localized edema: Secondary | ICD-10-CM

## 2017-10-16 DIAGNOSIS — Z794 Long term (current) use of insulin: Secondary | ICD-10-CM

## 2017-10-16 DIAGNOSIS — R0902 Hypoxemia: Secondary | ICD-10-CM

## 2017-10-16 DIAGNOSIS — R609 Edema, unspecified: Secondary | ICD-10-CM | POA: Diagnosis not present

## 2017-10-16 DIAGNOSIS — J9600 Acute respiratory failure, unspecified whether with hypoxia or hypercapnia: Secondary | ICD-10-CM | POA: Insufficient documentation

## 2017-10-16 DIAGNOSIS — J8410 Pulmonary fibrosis, unspecified: Secondary | ICD-10-CM | POA: Diagnosis not present

## 2017-10-16 DIAGNOSIS — S3991XA Unspecified injury of abdomen, initial encounter: Secondary | ICD-10-CM | POA: Diagnosis not present

## 2017-10-16 DIAGNOSIS — K7031 Alcoholic cirrhosis of liver with ascites: Secondary | ICD-10-CM | POA: Diagnosis not present

## 2017-10-16 DIAGNOSIS — E1159 Type 2 diabetes mellitus with other circulatory complications: Secondary | ICD-10-CM | POA: Diagnosis present

## 2017-10-16 DIAGNOSIS — E876 Hypokalemia: Secondary | ICD-10-CM | POA: Diagnosis not present

## 2017-10-16 DIAGNOSIS — E11649 Type 2 diabetes mellitus with hypoglycemia without coma: Secondary | ICD-10-CM | POA: Diagnosis present

## 2017-10-16 DIAGNOSIS — Z23 Encounter for immunization: Secondary | ICD-10-CM

## 2017-10-16 DIAGNOSIS — I493 Ventricular premature depolarization: Secondary | ICD-10-CM | POA: Diagnosis present

## 2017-10-16 DIAGNOSIS — J1000 Influenza due to other identified influenza virus with unspecified type of pneumonia: Secondary | ICD-10-CM | POA: Diagnosis not present

## 2017-10-16 DIAGNOSIS — R079 Chest pain, unspecified: Secondary | ICD-10-CM | POA: Diagnosis not present

## 2017-10-16 DIAGNOSIS — E119 Type 2 diabetes mellitus without complications: Secondary | ICD-10-CM

## 2017-10-16 LAB — COMPREHENSIVE METABOLIC PANEL
ALK PHOS: 228 U/L — AB (ref 38–126)
ALT: 77 U/L — AB (ref 17–63)
ALT: 67 U/L — ABNORMAL HIGH (ref 17–63)
AST: 337 U/L — ABNORMAL HIGH (ref 15–41)
AST: 281 U/L — AB (ref 15–41)
Albumin: 2.6 g/dL — ABNORMAL LOW (ref 3.5–5.0)
Albumin: 2.3 g/dL — ABNORMAL LOW (ref 3.5–5.0)
Alkaline Phosphatase: 259 U/L — ABNORMAL HIGH (ref 38–126)
Anion gap: 16 — ABNORMAL HIGH (ref 5–15)
Anion gap: 14 (ref 5–15)
BUN: 10 mg/dL (ref 6–20)
BUN: 8 mg/dL (ref 6–20)
CALCIUM: 7 mg/dL — AB (ref 8.9–10.3)
CHLORIDE: 94 mmol/L — AB (ref 101–111)
CHLORIDE: 97 mmol/L — AB (ref 101–111)
CO2: 23 mmol/L (ref 22–32)
CO2: 24 mmol/L (ref 22–32)
Calcium: 7.4 mg/dL — ABNORMAL LOW (ref 8.9–10.3)
Creatinine, Ser: 0.8 mg/dL (ref 0.61–1.24)
Creatinine, Ser: 0.75 mg/dL (ref 0.61–1.24)
GFR calc Af Amer: >60 mL/min (ref >60)
Glucose, Bld: 162 mg/dL — ABNORMAL HIGH (ref 65–99)
Glucose, Bld: 257 mg/dL — ABNORMAL HIGH (ref 65–99)
Potassium: 3.1 mmol/L — ABNORMAL LOW (ref 3.5–5.1)
Potassium: 3.4 mmol/L — ABNORMAL LOW (ref 3.5–5.1)
Sodium: 133 mmol/L — ABNORMAL LOW (ref 135–145)
Sodium: 135 mmol/L (ref 135–145)
Total Bilirubin: 3.9 mg/dL — ABNORMAL HIGH (ref 0.3–1.2)
Total Bilirubin: 3.3 mg/dL — ABNORMAL HIGH (ref 0.3–1.2)
Total Protein: 6.1 g/dL — ABNORMAL LOW (ref 6.5–8.1)
Total Protein: 5.7 g/dL — ABNORMAL LOW (ref 6.5–8.1)

## 2017-10-16 LAB — I-STAT ARTERIAL BLOOD GAS, ED
Acid-Base Excess: 1 mmol/L (ref 0.0–2.0)
Bicarbonate: 28.6 mmol/L — ABNORMAL HIGH (ref 20.0–28.0)
O2 SAT: 97 %
PCO2 ART: 55.9 mmHg — AB (ref 32.0–48.0)
PH ART: 7.319 — AB (ref 7.350–7.450)
PO2 ART: 104 mmHg (ref 83.0–108.0)
Patient temperature: 99.4
TCO2: 30 mmol/L (ref 22–32)

## 2017-10-16 LAB — TROPONIN I

## 2017-10-16 LAB — VERITOR FLU A/B WAIVED
INFLUENZA A: POSITIVE — AB
INFLUENZA B: NEGATIVE

## 2017-10-16 LAB — CBC WITH DIFFERENTIAL/PLATELET
BASOS PCT: 0 %
BASOS PCT: 0 %
Basophils Absolute: 0 10*3/uL (ref 0.0–0.1)
Basophils Absolute: 0 10*3/uL (ref 0.0–0.1)
EOS ABS: 0 10*3/uL (ref 0.0–0.7)
Eosinophils Absolute: 0 10*3/uL (ref 0.0–0.7)
Eosinophils Relative: 0 %
Eosinophils Relative: 0 %
HCT: 40.2 % (ref 39.0–52.0)
HEMATOCRIT: 36.7 % — AB (ref 39.0–52.0)
HEMOGLOBIN: 13.6 g/dL (ref 13.0–17.0)
Hemoglobin: 12.2 g/dL — ABNORMAL LOW (ref 13.0–17.0)
LYMPHS ABS: 0.3 10*3/uL — AB (ref 0.7–4.0)
Lymphocytes Relative: 3 %
Lymphocytes Relative: 3 %
Lymphs Abs: 0.3 10*3/uL — ABNORMAL LOW (ref 0.7–4.0)
MCH: 36.3 pg — ABNORMAL HIGH (ref 26.0–34.0)
MCH: 35.3 pg — ABNORMAL HIGH (ref 26.0–34.0)
MCHC: 33.8 g/dL (ref 30.0–36.0)
MCHC: 33.2 g/dL (ref 30.0–36.0)
MCV: 107.2 fL — ABNORMAL HIGH (ref 78.0–100.0)
MCV: 106.1 fL — ABNORMAL HIGH (ref 78.0–100.0)
MONO ABS: 0.5 10*3/uL (ref 0.1–1.0)
MONOS PCT: 5 %
Monocytes Absolute: 1 10*3/uL (ref 0.1–1.0)
Monocytes Relative: 10 %
NEUTROS ABS: 9.5 10*3/uL — AB (ref 1.7–7.7)
NEUTROS PCT: 87 %
Neutro Abs: 8.4 10*3/uL — ABNORMAL HIGH (ref 1.7–7.7)
Neutrophils Relative %: 92 %
Platelets: 93 10*3/uL — ABNORMAL LOW (ref 150–400)
Platelets: 97 10*3/uL — ABNORMAL LOW (ref 150–400)
RBC: 3.75 MIL/uL — AB (ref 4.22–5.81)
RBC: 3.46 MIL/uL — ABNORMAL LOW (ref 4.22–5.81)
RDW: 14.8 % (ref 11.5–15.5)
RDW: 14.7 % (ref 11.5–15.5)
WBC: 9.7 10*3/uL (ref 4.0–10.5)
WBC: 10.2 10*3/uL (ref 4.0–10.5)

## 2017-10-16 LAB — I-STAT CG4 LACTIC ACID, ED
LACTIC ACID, VENOUS: 4.37 mmol/L — AB (ref 0.5–1.9)
LACTIC ACID, VENOUS: 3.1 mmol/L — AB (ref 0.5–1.9)

## 2017-10-16 LAB — LACTIC ACID, PLASMA: Lactic Acid, Venous: 2.9 mmol/L (ref 0.5–1.9)

## 2017-10-16 LAB — PROCALCITONIN: Procalcitonin: 0.81 ng/mL

## 2017-10-16 LAB — CBG MONITORING, ED: GLUCOSE-CAPILLARY: 189 mg/dL — AB (ref 65–99)

## 2017-10-16 LAB — PROTIME-INR
INR: 1.58
Prothrombin Time: 18.7 seconds — ABNORMAL HIGH (ref 11.4–15.2)

## 2017-10-16 LAB — MRSA PCR SCREENING: MRSA BY PCR: NEGATIVE

## 2017-10-16 LAB — APTT: APTT: 46 s — AB (ref 24–36)

## 2017-10-16 MED ORDER — SODIUM CHLORIDE 0.9 % IV BOLUS (SEPSIS)
1000.0000 mL | Freq: Once | INTRAVENOUS | Status: AC
  Administered 2017-10-16: 1000 mL via INTRAVENOUS

## 2017-10-16 MED ORDER — METHYLPREDNISOLONE SODIUM SUCC 125 MG IJ SOLR
125.0000 mg | Freq: Once | INTRAMUSCULAR | Status: AC
  Administered 2017-10-16: 125 mg via INTRAVENOUS
  Filled 2017-10-16: qty 2

## 2017-10-16 MED ORDER — POTASSIUM CHLORIDE 20 MEQ PO PACK
20.0000 meq | PACK | Freq: Two times a day (BID) | ORAL | Status: DC
  Administered 2017-10-16: 20 meq via ORAL
  Filled 2017-10-16 (×2): qty 1

## 2017-10-16 MED ORDER — IOPAMIDOL (ISOVUE-300) INJECTION 61%
INTRAVENOUS | Status: AC
  Administered 2017-10-16: 100 mL
  Filled 2017-10-16: qty 100

## 2017-10-16 MED ORDER — INSULIN ASPART 100 UNIT/ML ~~LOC~~ SOLN
0.0000 [IU] | Freq: Three times a day (TID) | SUBCUTANEOUS | Status: DC
  Administered 2017-10-17: 8 [IU] via SUBCUTANEOUS
  Administered 2017-10-17: 3 [IU] via SUBCUTANEOUS

## 2017-10-16 MED ORDER — ALBUTEROL (5 MG/ML) CONTINUOUS INHALATION SOLN
10.0000 mg/h | INHALATION_SOLUTION | RESPIRATORY_TRACT | Status: DC
  Administered 2017-10-16: 10 mg/h via RESPIRATORY_TRACT
  Filled 2017-10-16: qty 20

## 2017-10-16 MED ORDER — OSELTAMIVIR PHOSPHATE 75 MG PO CAPS
75.0000 mg | ORAL_CAPSULE | Freq: Two times a day (BID) | ORAL | Status: DC
  Administered 2017-10-16 – 2017-10-18 (×4): 75 mg via ORAL
  Filled 2017-10-16 (×4): qty 1

## 2017-10-16 MED ORDER — MORPHINE SULFATE (PF) 4 MG/ML IV SOLN: 2.0000 mg | INTRAVENOUS | Status: DC | PRN

## 2017-10-16 MED ORDER — OSELTAMIVIR PHOSPHATE 75 MG PO CAPS
75.0000 mg | ORAL_CAPSULE | Freq: Once | ORAL | Status: AC
  Administered 2017-10-16: 75 mg via ORAL
  Filled 2017-10-16: qty 1

## 2017-10-16 MED ORDER — SODIUM CHLORIDE 0.9 % IV SOLN
1.0000 g | Freq: Once | INTRAVENOUS | Status: AC
  Administered 2017-10-16: 1 g via INTRAVENOUS
  Filled 2017-10-16: qty 10

## 2017-10-16 MED ORDER — PANTOPRAZOLE SODIUM 40 MG PO TBEC
40.0000 mg | DELAYED_RELEASE_TABLET | Freq: Every day | ORAL | Status: DC
  Administered 2017-10-16 – 2017-10-18 (×3): 40 mg via ORAL
  Filled 2017-10-16 (×3): qty 1

## 2017-10-16 MED ORDER — MORPHINE SULFATE (PF) 4 MG/ML IV SOLN
2.0000 mg | INTRAVENOUS | Status: DC | PRN
  Administered 2017-10-16 – 2017-10-17 (×4): 2 mg via INTRAVENOUS
  Filled 2017-10-16 (×6): qty 1

## 2017-10-16 MED ORDER — OSELTAMIVIR PHOSPHATE 75 MG PO CAPS: 75.0000 mg | ORAL_CAPSULE | Freq: Two times a day (BID) | ORAL | 0 refills | Status: DC

## 2017-10-16 MED ORDER — ONDANSETRON HCL 4 MG PO TABS: 4.0000 mg | ORAL_TABLET | Freq: Four times a day (QID) | ORAL | Status: DC | PRN

## 2017-10-16 MED ORDER — ENOXAPARIN SODIUM 40 MG/0.4ML ~~LOC~~ SOLN
40.0000 mg | SUBCUTANEOUS | Status: DC
  Administered 2017-10-16 – 2017-10-17 (×2): 40 mg via SUBCUTANEOUS
  Filled 2017-10-16 (×2): qty 0.4

## 2017-10-16 MED ORDER — ALBUTEROL SULFATE HFA 108 (90 BASE) MCG/ACT IN AERS: 2.0000 | INHALATION_SPRAY | Freq: Four times a day (QID) | RESPIRATORY_TRACT | 6 refills | Status: AC | PRN

## 2017-10-16 MED ORDER — GABAPENTIN 400 MG PO CAPS
1200.0000 mg | ORAL_CAPSULE | Freq: Three times a day (TID) | ORAL | Status: DC
  Administered 2017-10-16 – 2017-10-18 (×6): 1200 mg via ORAL
  Filled 2017-10-16 (×6): qty 3

## 2017-10-16 MED ORDER — GUAIFENESIN ER 600 MG PO TB12
600.0000 mg | ORAL_TABLET | Freq: Two times a day (BID) | ORAL | Status: DC
  Administered 2017-10-16 – 2017-10-18 (×4): 600 mg via ORAL
  Filled 2017-10-16 (×4): qty 1

## 2017-10-16 MED ORDER — ONDANSETRON HCL 4 MG/2ML IJ SOLN: 4.0000 mg | Freq: Four times a day (QID) | INTRAMUSCULAR | Status: DC | PRN

## 2017-10-16 MED ORDER — INFLUENZA VAC SPLIT QUAD 0.5 ML IM SUSY
0.5000 mL | PREFILLED_SYRINGE | INTRAMUSCULAR | Status: AC
  Administered 2017-10-18: 0.5 mL via INTRAMUSCULAR
  Filled 2017-10-16: qty 0.5

## 2017-10-16 MED ORDER — SODIUM CHLORIDE 0.9 % IV SOLN
500.0000 mg | Freq: Once | INTRAVENOUS | Status: AC
  Administered 2017-10-16: 500 mg via INTRAVENOUS
  Filled 2017-10-16: qty 500

## 2017-10-16 MED ORDER — INSULIN GLARGINE 100 UNIT/ML ~~LOC~~ SOLN
37.0000 [IU] | SUBCUTANEOUS | Status: DC
  Administered 2017-10-17: 37 [IU] via SUBCUTANEOUS
  Filled 2017-10-16 (×2): qty 0.37

## 2017-10-16 MED ORDER — HYDROMORPHONE HCL 1 MG/ML IJ SOLN
1.0000 mg | Freq: Once | INTRAMUSCULAR | Status: AC
  Administered 2017-10-16: 1 mg via INTRAVENOUS
  Filled 2017-10-16: qty 1

## 2017-10-16 MED ORDER — IPRATROPIUM-ALBUTEROL 0.5-2.5 (3) MG/3ML IN SOLN
3.0000 mL | Freq: Four times a day (QID) | RESPIRATORY_TRACT | Status: DC
  Administered 2017-10-16 – 2017-10-17 (×2): 3 mL via RESPIRATORY_TRACT
  Filled 2017-10-16 (×2): qty 3

## 2017-10-16 MED ORDER — PNEUMOCOCCAL VAC POLYVALENT 25 MCG/0.5ML IJ INJ
0.5000 mL | INJECTION | INTRAMUSCULAR | Status: DC
  Filled 2017-10-16: qty 0.5

## 2017-10-16 NOTE — ED Notes (Signed)
Attempted report 

## 2017-10-16 NOTE — ED Notes (Signed)
Called pt x1 no answer

## 2017-10-16 NOTE — ED Triage Notes (Signed)
Pt coming from PCP office with complaint of low O2 saturation. Pt arrived to ED with SPO2 of 73%. Pt placed on 4L via nasal cannula and improved to 90%. Pt alert but lethargic. Positive for flu.

## 2017-10-16 NOTE — Progress Notes (Signed)
BP (!) 151/89   Pulse (!) 109   Temp (!) 97.1 F (36.2 C) (Oral)   Resp (!) 22   Ht 5\' 9"  (1.753 m)   Wt 197 lb (89.4 kg)   SpO2 (!) 72%   BMI 29.09 kg/m    Subjective:    Patient ID: [name], male    DOB: 12/21/1960, 57 y.o.   MRN: [id number]  HPI: [name] is a 57 y.o. male presenting on 10/16/2017 for Cough; Nasal Congestion; Fever; and Chills  Patient states he has been feeling bad for about a week.  However in the past 2 days he has had a great decrease in his overall well-being.  He feels very short of breath and coughing a lot.  He has had a history of an acute respiratory failure in the past.  He has been exposed to influenza in the past week.  He is not on oxygen all the time.  His oxygen was very low today and we have placed on 3 L of oxygen and only brought his O2 up to 86%.  He is extremely ill looking.  He has had admission in the past for several respiratory conditions.  While in the office today he had a positive influenza A test.  Past Medical History:  Diagnosis Date  . Chronic lower back pain   . Compression fracture    lumbar 3  . Diabetes mellitus   . Fracture acetabulum-closed (HCC) 04/09/2013  . GERD (gastroesophageal reflux disease)   . H/O Legionnaire's disease   . Hepatomegaly   . Migraine   . Osteoporosis   . Pancreatitis   . PTSD (post-traumatic stress disorder)   . Vitamin D deficiency    Relevant past medical, surgical, family and social history reviewed and updated as indicated. Interim medical history since our last visit reviewed. Allergies and medications reviewed and updated. DATA REVIEWED: CHART IN EPIC  Family History reviewed for pertinent findings.  Review of Systems  Constitutional: Positive for activity change, fatigue and fever. Negative for appetite change.  HENT: Positive for congestion and sore throat. Negative for sinus pressure.   Eyes: Negative.  Negative for pain and visual disturbance.  Respiratory:  Positive for apnea, cough, shortness of breath and wheezing. Negative for chest tightness.   Cardiovascular: Negative.  Negative for chest pain, palpitations and leg swelling.  Gastrointestinal: Positive for nausea. Negative for abdominal pain, diarrhea and vomiting.  Endocrine: Negative.   Genitourinary: Negative.   Musculoskeletal: Positive for back pain and myalgias. Negative for arthralgias.  Skin: Negative.  Negative for color change and rash.  Neurological: Positive for headaches. Negative for weakness and numbness.  Psychiatric/Behavioral: Negative.     Allergies as of 10/16/2017      Reactions   Temsirolimus Swelling   Ketorolac Tromethamine    Hives (toradol)      Medication List        Accurate as of 10/16/17  8:54 AM. Always use your most recent med list.          Calcium-Vitamin D 600-400 MG-UNIT Tabs Take 1 tablet by mouth daily.   gabapentin 400 MG capsule Commonly known as:  NEURONTIN Take 3 capsules (1,200 mg total) by mouth 3 (three) times daily.   guaiFENesin 600 MG 12 hr tablet Commonly known as:  MUCINEX Take 1 tablet (600 mg total) by mouth 2 (two) times daily.   ibuprofen 400 MG tablet Commonly known as:  ADVIL,MOTRIN Take 400 mg by mouth every  6 (six) hours as needed.   LANTUS SOLOSTAR 100 UNIT/ML Solostar Pen Generic drug:  Insulin Glargine INJECT SUBCUTANEOUSLY 37  UNITS EVERY MORNING   NOVOLOG FLEXPEN 100 UNIT/ML FlexPen Generic drug:  insulin aspart INJECT 7 TO 10 UNITS  SUBCUTANEOUSLY UP TO 4  TIMES DAILY; MAXIMUM DAILY  DOSE = 40 UNITS   oxyCODONE 5 MG immediate release tablet Commonly known as:  Oxy IR/ROXICODONE Take 1 tablet (5 mg total) by mouth every 4 (four) hours as needed for moderate pain, severe pain or breakthrough pain.   oxyCODONE 5 MG immediate release tablet Commonly known as:  Oxy IR/ROXICODONE Take 1-2 tablets (5-10 mg total) by mouth every 4 (four) hours as needed for moderate pain or severe pain.   pantoprazole 20  MG tablet Commonly known as:  PROTONIX Take 20 mg by mouth 2 (two) times daily.   polyethylene glycol packet Commonly known as:  MIRALAX / GLYCOLAX Take 17 g by mouth 2 (two) times daily.   Potassium Chloride ER 20 MEQ Tbcr Take 20 mEq by mouth daily.          Objective:    BP (!) 151/89   Pulse (!) 109   Temp (!) 97.1 F (36.2 C) (Oral)   Resp (!) 22   Ht 5\' 9"  (1.753 m)   Wt 197 lb (89.4 kg)   SpO2 (!) 72%   BMI 29.09 kg/m   Allergies  Allergen Reactions  . Temsirolimus Swelling  . Ketorolac Tromethamine     Hives (toradol)    Wt Readings from Last 3 Encounters:  10/16/17 197 lb (89.4 kg)  06/12/17 182 lb (82.6 kg)  11/29/16 184 lb (83.5 kg)    Physical Exam  Constitutional: He is oriented to person, place, and time. He appears well-developed and well-nourished. He appears distressed.  HENT:  Head: Normocephalic and atraumatic.  Right Ear: Tympanic membrane normal. No drainage. No middle ear effusion.  Left Ear: Tympanic membrane normal. No drainage.  No middle ear effusion.  Nose: Mucosal edema and rhinorrhea present. Right sinus exhibits no maxillary sinus tenderness. Left sinus exhibits no maxillary sinus tenderness.  Mouth/Throat: Uvula is midline. Posterior oropharyngeal erythema present. No oropharyngeal exudate.  Eyes: Conjunctivae and EOM are normal. Pupils are equal, round, and reactive to light. Right eye exhibits no discharge. Left eye exhibits no discharge.  Neck: Normal range of motion.  Cardiovascular: Normal rate, regular rhythm and normal heart sounds.  Pulmonary/Chest: He is in respiratory distress. He has decreased breath sounds in the right lower field and the left lower field. He has wheezes in the right upper field and the left upper field. He has rhonchi in the right lower field and the left lower field.  Abdominal: Soft.  Lymphadenopathy:    He has no cervical adenopathy.  Neurological: He is alert and oriented to person, place, and time.   Skin: Skin is warm and dry.  Psychiatric: He has a normal mood and affect. His behavior is normal.  Nursing note and vitals reviewed.   Results for orders placed or performed during the hospital encounter of 06/12/17  Basic metabolic panel  Result Value Ref Range   Sodium 134 (L) 135 - 145 mmol/L   Potassium 4.1 3.5 - 5.1 mmol/L   Chloride 93 (L) 101 - 111 mmol/L   CO2 28 22 - 32 mmol/L   Glucose, Bld 188 (H) 65 - 99 mg/dL   BUN 8 6 - 20 mg/dL   Creatinine, Ser 9.56 0.61 -  1.24 mg/dL   Calcium 8.8 (L) 8.9 - 10.3 mg/dL   GFR calc non Af Amer >60 >60 mL/min   GFR calc Af Amer >60 >60 mL/min   Anion gap 13 5 - 15  CBC  Result Value Ref Range   WBC 5.9 4.0 - 10.5 K/uL   RBC 4.69 4.22 - 5.81 MIL/uL   Hemoglobin 16.4 13.0 - 17.0 g/dL   HCT 69.6 29.5 - 28.4 %   MCV 103.6 (H) 78.0 - 100.0 fL   MCH 35.0 (H) 26.0 - 34.0 pg   MCHC 33.7 30.0 - 36.0 g/dL   RDW 13.2 44.0 - 10.2 %   Platelets 100 (L) 150 - 400 K/uL  Glucose, capillary  Result Value Ref Range   Glucose-Capillary 126 (H) 65 - 99 mg/dL  Glucose, capillary  Result Value Ref Range   Glucose-Capillary 131 (H) 65 - 99 mg/dL      Assessment & Plan:   1. Flu-like symptoms POSITIVE INFLUENZA A Directed to come to emergency room for respiratory distress - Veritor Flu A/B Waived  2. Hypoxia Oxygen was 65% on room air upon arrival, only improved to 86% on 3 L of oxygen.  Was given nebulizer with albuterol.  3. Bronchopneumonia  4. Acute respiratory failure with hypoxia (HCC)   Continue all other maintenance medications as listed above.  Follow up plan: No Follow-up on file.  Educational handout given for survey  Remus Loffler PA-C [facility] [address]  [place], [place] [address] [phone]   10/16/2017, 8:54 AM

## 2017-10-16 NOTE — ED Notes (Signed)
Pt having increased shortness of breath. EDP placed order for bipap, RT aware.

## 2017-10-16 NOTE — H&P (Signed)
History and Physical    CREEK GAN ZOX:[id number] DOB: [birth year] DOA: 10/16/2017  PCP: Dettinger, Elige Radon, MD Consultants:    Patient coming from:  Home - lives with his wife Chief Complaint: SOB, cough  HPI: [name] is a 57 y.o. male with medical history significant of HTN, GERD, alcoholic hepatitis with liver cirrhosis, pancreatitis, migraines, osteoporosis, DM type II who presented to the ED with complaints of severe SOB. Patient was seen today at his PCP office with complaints of progressive dyspnea and was noted that his and his SpO2 dropped to 73% on RA. Patient was transferred to [facility] for further care. Patient reported that his symptoms of progressive shortness of breath, cough with greenish sputum, body ache, fevers were persistent for probably 3 days. He also complains of severe back pain and has a history of lumbar spine surgery and severe pain of the lower extremities due to neuropathy.  ED Course: In the ED his VS showed temperature of 99.4 degrees of Fahrenheit, pulse 109, respirations 27, blood pressure 151/89 Chest x-ray showed no edema or consolidation. EKG showed sinus tachycardia with PVCs, nonspecific ST-T wave changes. Blood work demonstrated elevated 4.37 with improved with hydration and decreased to 3.10 CMP showed hypokalemia with potassium 3.1, hyponatremia with sodium 133, glucose 162, AST 337, alkaline phosphatase 259, total bilirubin 3.9 probably associated with alcoholic hepatitis and liver cirrhosis, BUN 10 creatinine 0.80 CBC demonstrated normal white blood cell count 9700 and normal hemoglobin 13.6.  Review of Systems: As per HPI; otherwise review of systems reviewed and negative.   Ambulatory Status: Ambulates with assistance  Past Medical History:  Diagnosis Date  . Chronic lower back pain   . Compression fracture    lumbar 3  . Diabetes mellitus   . Fracture acetabulum-closed (HCC) 04/09/2013  . GERD (gastroesophageal reflux disease)     . H/O Legionnaire's disease   . Hepatomegaly   . Migraine   . Osteoporosis   . Pancreatitis   . PTSD (post-traumatic stress disorder)   . Vitamin D deficiency     Past Surgical History:  Procedure Laterality Date  . BACK SURGERY    . CHOLECYSTECTOMY    . HERNIA REPAIR    . KYPHOPLASTY N/A 07/16/2015   Procedure: Lumbar three kyphoplasty;  Surgeon: Coletta Memos, MD;  Location: [facility];  Service: Neurosurgery;  Laterality: N/A;  Lumbar three kyphoplasty  . LAMINECTOMY    . MASS EXCISION Left 06/12/2017   Procedure: EXCISION LEFT AXILLARY SEBACEOUS CYST;  Surgeon: Abigail Miyamoto, MD;  Location: [facility];  Service: General;  Laterality: Left;  . STERNUM FRACTURE SURGERY      Social History   Socioeconomic History  . Marital status: Married    Spouse name: Not on file  . Number of children: Not on file  . Years of education: Not on file  . Highest education level: Not on file  Social Needs  . Financial resource strain: Not on file  . Food insecurity - worry: Not on file  . Food insecurity - inability: Not on file  . Transportation needs - medical: Not on file  . Transportation needs - non-medical: Not on file  Occupational History  . Not on file  Tobacco Use  . Smoking status: Former Smoker    Packs/day: 0.50    Years: 26.00    Pack years: 13.00    Types: Cigarettes    Last attempt to quit: 08/26/2007    Years since quitting: 10.1  .  Smokeless tobacco: Never Used  Substance and Sexual Activity  . Alcohol use: No    Alcohol/week: 0.0 oz  . Drug use: No  . Sexual activity: Not on file  Other Topics Concern  . Not on file  Social History Narrative  . Not on file    Allergies  Allergen Reactions  . Temsirolimus Swelling  . Ketorolac Tromethamine     Hives (toradol)    Family History  Problem Relation Age of Onset  . Cancer Unknown        breast/grandmother, prostate/grandfather    Prior to Admission medications   Medication Sig  Start Date End Date Taking? Authorizing Provider  albuterol (PROVENTIL HFA;VENTOLIN HFA) 108 (90 Base) MCG/ACT inhaler Inhale 2 puffs into the lungs every 6 (six) hours as needed for wheezing or shortness of breath. 10/16/17   Remus Loffler, PA-C  Calcium Carb-Cholecalciferol (CALCIUM-VITAMIN D) 600-400 MG-UNIT TABS Take 1 tablet by mouth daily.    [provider]  gabapentin (NEURONTIN) 400 MG capsule Take 3 capsules (1,200 mg total) by mouth 3 (three) times daily. 12/01/16 10/16/17  Richarda Overlie, MD  guaiFENesin (MUCINEX) 600 MG 12 hr tablet Take 1 tablet (600 mg total) by mouth 2 (two) times daily. 12/01/16   Richarda Overlie, MD  ibuprofen (ADVIL,MOTRIN) 400 MG tablet Take 400 mg by mouth every 6 (six) hours as needed.    [provider]  LANTUS SOLOSTAR 100 UNIT/ML Solostar Pen INJECT SUBCUTANEOUSLY 37  UNITS EVERY MORNING 04/02/17   Dettinger, Elige Radon, MD  NOVOLOG FLEXPEN 100 UNIT/ML FlexPen INJECT 7 TO 10 UNITS  SUBCUTANEOUSLY UP TO 4  TIMES DAILY; MAXIMUM DAILY  DOSE = 40 UNITS 04/02/17   Dettinger, Elige Radon, MD  oseltamivir (TAMIFLU) 75 MG capsule Take 1 capsule (75 mg total) by mouth 2 (two) times daily. 10/16/17   Remus Loffler, PA-C  oxyCODONE (OXY IR/ROXICODONE) 5 MG immediate release tablet Take 1 tablet (5 mg total) by mouth every 4 (four) hours as needed for moderate pain, severe pain or breakthrough pain. 07/19/15   Trixie Dredge, PA-C  oxyCODONE (OXY IR/ROXICODONE) 5 MG immediate release tablet Take 1-2 tablets (5-10 mg total) by mouth every 4 (four) hours as needed for moderate pain or severe pain. 06/12/17   Abigail Miyamoto, MD  pantoprazole (PROTONIX) 20 MG tablet Take 20 mg by mouth 2 (two) times daily.     [provider]  polyethylene glycol (MIRALAX / GLYCOLAX) packet Take 17 g by mouth 2 (two) times daily. 12/01/16   Richarda Overlie, MD  Potassium Chloride ER 20 MEQ TBCR Take 20 mEq by mouth daily. 08/28/16   Dettinger, Elige Radon, MD    Physical Exam: Vitals:    10/16/17 1245 10/16/17 1415 10/16/17 1430 10/16/17 1516  BP: 116/80 106/76 111/73 118/88  Pulse: (!) 102 91 89   Resp: (!) 23 20 (!) 22   Temp:      TempSrc:      SpO2: 97% 95% 97% 100%     General:  Appears calm and comfortable Eyes: PERRLA, sckerae unicteric, EOM intact,  ENT: normal external ear canals, oral mucous membranes moist and intact, no nasal discharge Neck: no lympnadenopathy, masses or thyromegaly Cardiovascular: Distant RRR, no murmurs, gallops or rubs, large nonpitting LE edema. Respiratory:  Bilaterally  rhonchi and rales with left-sided expiratory wheezes , no adventitious sounds auscultated. Normal respiratory effort. Abdomen: soft, NT / ND, BS (+) 4, no masses or organomegaly appreciated Skin: no rash or induration seen  on limited exam Musculoskeletal: no joint deformities observed, active full ROM  Psychiatric: grossly normal mood and affect, speech fluent and appropriate Neurologic: CN II-XII grossly normal, no focal deficit visualized, moves all extremities independently,sensation intact.        Radiological Exams on Admission: Dg Chest 2 View  Result Date: 10/16/2017 CLINICAL DATA:  Decreased oxygen saturation.  Lethargy. EXAM: CHEST  2 VIEW COMPARISON:  August 03, 2015 FINDINGS: There is fibrotic change in the lung bases. There is no edema or consolidation. Heart is mildly enlarged with pulmonary vascularity within normal limits. There is aortic atherosclerosis. There is evidence of a prior fracture of the sternum with screw and plate fixation. There old healed rib fractures bilaterally. There are compression fractures at several levels in the mid and lower thoracic as well as upper lumbar spine regions. There is evidence of prior kyphoplasty and upper lumbar vertebral body. IMPRESSION: Bibasilar fibrosis. No edema or consolidation. Stable cardiac prominence. There is aortic atherosclerosis. Evidence of bony trauma at multiple sites, stable. Aortic  Atherosclerosis (ICD10-I70.0). Electronically Signed   By: Bretta Bang III M.D.   On: 10/16/2017 10:40   Ct Chest W Contrast  Result Date: 10/16/2017 CLINICAL DATA:  Mid chest pain with back pain and shortness of breath. Blunt abdominal trauma. Flu. EXAM: CT CHEST, ABDOMEN, AND PELVIS WITH CONTRAST TECHNIQUE: Multidetector CT imaging of the chest, abdomen and pelvis was performed following the standard protocol during bolus administration of intravenous contrast. CONTRAST:  ISOVUE-300 IOPAMIDOL (ISOVUE-300) INJECTION 61% COMPARISON:  Lumbar MRI 11/28/2016.  Abdominopelvic CT 08/06/2007. FINDINGS: CT CHEST FINDINGS Cardiovascular: Atherosclerosis of the aorta, great vessels and coronary arteries. No acute vascular findings or evidence of mediastinal hematoma. The heart size is normal. There is no pericardial effusion. Mediastinum/Nodes: There are no enlarged mediastinal, hilar or axillary lymph nodes. There is a small hiatal hernia. The trachea and thyroid gland appear unremarkable. Lungs/Pleura: No pneumothorax or significant pleural effusion. There are patchy dependent airspace opacities at both lung bases with associated central airway thickening. There are lesser peribronchial opacities in the upper and right middle lobes. In the setting of trauma, some of these could reflect atelectasis, although inflammation is suspected. Musculoskeletal/Chest wall: No definite acute osseous findings. There is an old sternal fracture status post ORIF. This remains anteriorly displaced. There are multiple rib fractures bilaterally. There are multiple compression deformities throughout the thoracic spine. Fractures at T3, T5, T8, T11 and T12 are grossly stable. Fracture at T10 appears progressive from chest radiographs 08/03/2015. Moderate bilateral gynecomastia. CT ABDOMEN AND PELVIS FINDINGS Hepatobiliary: Interval development of severe hepatic steatosis. No focal hepatic lesions are identified. Previous  cholecystectomy without significant biliary dilatation. Pancreas: Severe pancreatic atrophy. No focal surrounding inflammation. Spleen: Normal in size without focal abnormality. Adrenals/Urinary Tract: Both adrenal glands appear normal. The kidneys appear normal without evidence of urinary tract calculus, suspicious lesion or hydronephrosis. No bladder abnormalities are seen. Stomach/Bowel: The stomach and small bowel appear normal. There is generalized colonic wall thickening without focal surrounding inflammation. The appendix appears normal. No evidence of bowel obstruction or perforation. Vascular/Lymphatic: There are no enlarged abdominal or pelvic lymph nodes. Diffuse aortic and branch vessel atherosclerosis. No acute vascular findings. No significant venous findings. Reproductive: The prostate gland and seminal vesicles appear unremarkable. Other: A small amount of ascites is present. There is edema throughout the intra-abdominal and subcutaneous fat. Musculoskeletal: Multiple lumbar compression deformities are present, stable from lumbar MRI 10 months ago. Previous L3 spinal augmentation. No definite acute osseous  findings. IMPRESSION: 1. No acute clear posttraumatic findings in the chest, abdomen or pelvis. Multiple fractures are demonstrated, mostly stable. Fracture at T10 appears progressive from 2016. 2. Patchy bilateral pulmonary opacities, probably due to a combination of atelectasis and infection/inflammation. 3. Severe hepatic steatosis and severe pancreatic atrophy. 4. Generalized colonic wall thickening may be secondary to underlying suspected liver disease or reflect diffuse colitis. Mild ascites with generalized edema throughout the abdominal fat. 5.  Aortic Atherosclerosis (ICD10-I70.0). Electronically Signed   By: Carey BullocksWilliam  Veazey M.D.   On: 10/16/2017 14:00   Ct Abdomen Pelvis W Contrast  Result Date: 10/16/2017 CLINICAL DATA:  Mid chest pain with back pain and shortness of breath. Blunt  abdominal trauma. Flu. EXAM: CT CHEST, ABDOMEN, AND PELVIS WITH CONTRAST TECHNIQUE: Multidetector CT imaging of the chest, abdomen and pelvis was performed following the standard protocol during bolus administration of intravenous contrast. CONTRAST:  100mL ISOVUE-300 IOPAMIDOL (ISOVUE-300) INJECTION 61% COMPARISON:  Lumbar MRI 11/28/2016.  Abdominopelvic CT 08/06/2007. FINDINGS: CT CHEST FINDINGS Cardiovascular: Atherosclerosis of the aorta, great vessels and coronary arteries. No acute vascular findings or evidence of mediastinal hematoma. The heart size is normal. There is no pericardial effusion. Mediastinum/Nodes: There are no enlarged mediastinal, hilar or axillary lymph nodes. There is a small hiatal hernia. The trachea and thyroid gland appear unremarkable. Lungs/Pleura: No pneumothorax or significant pleural effusion. There are patchy dependent airspace opacities at both lung bases with associated central airway thickening. There are lesser peribronchial opacities in the upper and right middle lobes. In the setting of trauma, some of these could reflect atelectasis, although inflammation is suspected. Musculoskeletal/Chest wall: No definite acute osseous findings. There is an old sternal fracture status post ORIF. This remains anteriorly displaced. There are multiple rib fractures bilaterally. There are multiple compression deformities throughout the thoracic spine. Fractures at T3, T5, T8, T11 and T12 are grossly stable. Fracture at T10 appears progressive from chest radiographs 08/03/2015. Moderate bilateral gynecomastia. CT ABDOMEN AND PELVIS FINDINGS Hepatobiliary: Interval development of severe hepatic steatosis. No focal hepatic lesions are identified. Previous cholecystectomy without significant biliary dilatation. Pancreas: Severe pancreatic atrophy. No focal surrounding inflammation. Spleen: Normal in size without focal abnormality. Adrenals/Urinary Tract: Both adrenal glands appear normal. The  kidneys appear normal without evidence of urinary tract calculus, suspicious lesion or hydronephrosis. No bladder abnormalities are seen. Stomach/Bowel: The stomach and small bowel appear normal. There is generalized colonic wall thickening without focal surrounding inflammation. The appendix appears normal. No evidence of bowel obstruction or perforation. Vascular/Lymphatic: There are no enlarged abdominal or pelvic lymph nodes. Diffuse aortic and branch vessel atherosclerosis. No acute vascular findings. No significant venous findings. Reproductive: The prostate gland and seminal vesicles appear unremarkable. Other: A small amount of ascites is present. There is edema throughout the intra-abdominal and subcutaneous fat. Musculoskeletal: Multiple lumbar compression deformities are present, stable from lumbar MRI 10 months ago. Previous L3 spinal augmentation. No definite acute osseous findings. IMPRESSION: 1. No acute clear posttraumatic findings in the chest, abdomen or pelvis. Multiple fractures are demonstrated, mostly stable. Fracture at T10 appears progressive from 2016. 2. Patchy bilateral pulmonary opacities, probably due to a combination of atelectasis and infection/inflammation. 3. Severe hepatic steatosis and severe pancreatic atrophy. 4. Generalized colonic wall thickening may be secondary to underlying suspected liver disease or reflect diffuse colitis. Mild ascites with generalized edema throughout the abdominal fat. 5.  Aortic Atherosclerosis (ICD10-I70.0). Electronically Signed   By: Carey BullocksWilliam  Veazey M.D.   On: 10/16/2017 14:00    EKG: Independently  reviewed. sinus tachycardia with PVCs, nonspecific ST-T wave changes.   Labs on Admission: I have personally reviewed the available labs and imaging studies at the time of the admission.    Assessment/Plan Principal Problem:   Sepsis (HCC) Active Problems:   HTN (hypertension)   Type 2 diabetes mellitus (HCC)   Acute respiratory failure  with hypoxia (HCC)   CAP (community acquired pneumonia)   Influenza A     Sepsis - likely associated with upper respiratory infection Patient had positive flu test, started on Tamiflu, which will be continued Although his chest x-ray did not show obvious consolidations, clinically patient has picture of pneumonia started on Zithromax and Rocephin in the ED and will continue. Blood culture pending MRSA PCR was ordered to guide antibiotic therapy choice   Acute respiratory failure with hypoxia acute respiratory failure Patient presented with severe hypoxia, his oxygen was 73%  on room air patient required BiPAP placement  Will be admitted to stepdown unit Continues to be very dyspneic, but tolerates BiPAP well He was given a round of continuous nebulizer treatment, still has residual wheezing on the left side Continue IV steroids and nebs as needed   DM type II - last HgbA1C is 8.3% on 08/28/16 Continue home doses of Insulin Recheck Hgb A1c Continue diabetic diet, monitor FSBS, add sliding scale insulin  Hypertension - currently stable Continue home medication and adjust the doses if needed depending on the BP readings and PS   DVT prophylaxis: Lovenox or SCDs Code Status: Full - confirmed with patient/family Family Communication: at bedside Disposition Plan: TBD Consults called: none Admission status: inpatient   Raymon Mutton PA-C [phone] [facility]  If note is complete, please contact covering daytime or nighttime physician. [email or web address] Password [facility]  10/16/2017, 4:29 PM

## 2017-10-16 NOTE — ED Notes (Signed)
Report called  

## 2017-10-16 NOTE — Progress Notes (Signed)
Pt admitted in 2c09 from ED under code sepsis protocol, upon arrival to room patient assessed and found to be volume overloaded therefore MD called and verified the patient did not receive the full volume of fluid per protocol. Will continue to monitor.

## 2017-10-16 NOTE — Patient Instructions (Signed)
1. Flu-like symptoms POSITIVE INFLUENZA A Directed to come to emergency room for respiratory distress - Veritor Flu A/B Waived  2. Hypoxia Oxygen was 65% on room air upon arrival, only improved to 86% on 3 L of oxygen.  Was given nebulizer with albuterol.  3. Bronchopneumonia  4. Acute respiratory failure with hypoxia (HCC)

## 2017-10-16 NOTE — ED Provider Notes (Signed)
[facility] EMERGENCY DEPARTMENT Provider Note   CSN: [id number] Arrival date & time: 10/16/17  0865     History   Chief Complaint Chief Complaint  Patient presents with  . Influenza    HPI [name] is a 57 y.o. male.  Pt presents from his doctor's office today with sob and hypoxia.  The pt has been sick for the past few days.  He had a + influenza A at his doctor's office.  RA O2 sat 73%.  Pt's family also said that he fell and has a lot of bruising around his chest.      Past Medical History:  Diagnosis Date  . Chronic lower back pain   . Compression fracture    lumbar 3  . Diabetes mellitus   . Fracture acetabulum-closed (HCC) 04/09/2013  . GERD (gastroesophageal reflux disease)   . H/O Legionnaire's disease   . Hepatomegaly   . Migraine   . Osteoporosis   . Pancreatitis   . PTSD (post-traumatic stress disorder)   . Vitamin D deficiency     Patient Active Problem List   Diagnosis Date Noted  . Closed fracture of second lumbar vertebra (HCC)   . Fall   . Cough 08/03/2015  . Compression fracture of L3 lumbar vertebra with delayed healing 07/16/2015  . Compression fracture of lumbar vertebra (HCC) 07/16/2015  . Pressure ulcer 01/18/2015  . Acute respiratory failure (HCC)   . Elevated bilirubin   . Acute respiratory failure with hypoxia (HCC)   . HCAP (healthcare-associated pneumonia) 01/11/2015  . Fever   . Hypoxia   . AKI (acute kidney injury) (HCC)   . Lactic acidosis   . ARDS (adult respiratory distress syndrome) (HCC)   . Respiratory failure (HCC)   . Lumbar foraminal stenosis 04/19/2013  . Acetabulum fracture, right (HCC) 04/09/2013  . Type 2 diabetes mellitus (HCC) 04/09/2013  . ALCOHOLIC HEPATITIS 06/12/2010  . CIRRHOSIS, ALCOHOLIC 06/12/2010  . JAUNDICE 05/01/2010  . LIVER FUNCTION TESTS, ABNORMAL, HX OF 10/02/2009  . PANCREATITIS, HX OF 09/27/2009  . HYPOGONADISM 08/29/2009  . VITAMIN D DEFICIENCY 08/29/2009  .  OSTEOPOROSIS 02/22/2009  . HYPERTENSION 02/08/2009  . GERD 02/08/2009  . BACK PAIN, LUMBAR, CHRONIC 02/08/2009  . FATIGUE 02/08/2009  . LEG EDEMA, LEFT 02/08/2009  . Hepatomegaly 02/08/2009    Past Surgical History:  Procedure Laterality Date  . BACK SURGERY    . CHOLECYSTECTOMY    . HERNIA REPAIR    . KYPHOPLASTY N/A 07/16/2015   Procedure: Lumbar three kyphoplasty;  Surgeon: Coletta Memos, MD;  Location: [facility];  Service: Neurosurgery;  Laterality: N/A;  Lumbar three kyphoplasty  . LAMINECTOMY    . MASS EXCISION Left 06/12/2017   Procedure: EXCISION LEFT AXILLARY SEBACEOUS CYST;  Surgeon: Abigail Miyamoto, MD;  Location: [facility];  Service: General;  Laterality: Left;  . STERNUM FRACTURE SURGERY         Home Medications    Prior to Admission medications   Medication Sig Start Date End Date Taking? Authorizing Provider  albuterol (PROVENTIL HFA;VENTOLIN HFA) 108 (90 Base) MCG/ACT inhaler Inhale 2 puffs into the lungs every 6 (six) hours as needed for wheezing or shortness of breath. 10/16/17   Remus Loffler, PA-C  Calcium Carb-Cholecalciferol (CALCIUM-VITAMIN D) 600-400 MG-UNIT TABS Take 1 tablet by mouth daily.    [provider]  gabapentin (NEURONTIN) 400 MG capsule Take 3 capsules (1,200 mg total) by mouth 3 (three) times daily. 12/01/16 10/16/17  Richarda Overlie, MD  guaiFENesin (MUCINEX) 600 MG 12 hr tablet Take 1 tablet (600 mg total) by mouth 2 (two) times daily. 12/01/16   Richarda Overlie, MD  ibuprofen (ADVIL,MOTRIN) 400 MG tablet Take 400 mg by mouth every 6 (six) hours as needed.    [provider]  LANTUS SOLOSTAR 100 UNIT/ML Solostar Pen INJECT SUBCUTANEOUSLY 37  UNITS EVERY MORNING 04/02/17   Dettinger, Elige Radon, MD  NOVOLOG FLEXPEN 100 UNIT/ML FlexPen INJECT 7 TO 10 UNITS  SUBCUTANEOUSLY UP TO 4  TIMES DAILY; MAXIMUM DAILY  DOSE = 40 UNITS 04/02/17   Dettinger, Elige Radon, MD  oseltamivir (TAMIFLU) 75 MG capsule Take 1 capsule (75 mg  total) by mouth 2 (two) times daily. 10/16/17   Remus Loffler, PA-C  oxyCODONE (OXY IR/ROXICODONE) 5 MG immediate release tablet Take 1 tablet (5 mg total) by mouth every 4 (four) hours as needed for moderate pain, severe pain or breakthrough pain. 07/19/15   Trixie Dredge, PA-C  oxyCODONE (OXY IR/ROXICODONE) 5 MG immediate release tablet Take 1-2 tablets (5-10 mg total) by mouth every 4 (four) hours as needed for moderate pain or severe pain. 06/12/17   Abigail Miyamoto, MD  pantoprazole (PROTONIX) 20 MG tablet Take 20 mg by mouth 2 (two) times daily.     [provider]  polyethylene glycol (MIRALAX / GLYCOLAX) packet Take 17 g by mouth 2 (two) times daily. 12/01/16   Richarda Overlie, MD  Potassium Chloride ER 20 MEQ TBCR Take 20 mEq by mouth daily. 08/28/16   Dettinger, Elige Radon, MD    Family History Family History  Problem Relation Age of Onset  . Cancer Unknown        breast/grandmother, prostate/grandfather    Social History Social History   Tobacco Use  . Smoking status: Former Smoker    Packs/day: 0.50    Years: 26.00    Pack years: 13.00    Types: Cigarettes    Last attempt to quit: 08/26/2007    Years since quitting: 10.1  . Smokeless tobacco: Never Used  Substance Use Topics  . Alcohol use: No    Alcohol/week: 0.0 oz  . Drug use: No     Allergies   Temsirolimus and Ketorolac tromethamine   Review of Systems Review of Systems  Constitutional: Positive for chills, fatigue and fever.  Respiratory: Positive for cough, shortness of breath and wheezing.   Cardiovascular:       Chest wall bruising  Neurological: Positive for weakness.  All other systems reviewed and are negative.    Physical Exam Updated Vital Signs BP 116/80   Pulse (!) 102   Temp 99.4 F (37.4 C) (Oral)   Resp (!) 23   SpO2 97%   Physical Exam  Constitutional: He is oriented to person, place, and time. He appears well-developed and well-nourished.  HENT:  Head: Normocephalic and  atraumatic.  Right Ear: External ear normal.  Left Ear: External ear normal.  Eyes: Conjunctivae and EOM are normal. Pupils are equal, round, and reactive to light.  Neck: Normal range of motion. Neck supple.  Cardiovascular: Normal rate, regular rhythm, normal heart sounds and intact distal pulses.  Pulmonary/Chest: Tachypnea noted. He is in respiratory distress. He has wheezes.  Abdominal: Soft. Bowel sounds are normal.  Musculoskeletal: Normal range of motion. He exhibits edema.  Neurological: He is alert and oriented to person, place, and time.  Skin: Skin is warm. Capillary refill takes less than 2 seconds.  Psychiatric: He has a normal  mood and affect. His behavior is normal. Judgment and thought content normal.  Nursing note and vitals reviewed.    ED Treatments / Results  Labs (all labs ordered are listed, but only abnormal results are displayed) Labs Reviewed  COMPREHENSIVE METABOLIC PANEL - Abnormal; Notable for the following components:      Result Value   Sodium 133 (*)    Potassium 3.1 (*)    Chloride 94 (*)    Glucose, Bld 162 (*)    Calcium 7.4 (*)    Total Protein 6.1 (*)    Albumin 2.6 (*)    AST 337 (*)    ALT 77 (*)    Alkaline Phosphatase 259 (*)    Total Bilirubin 3.9 (*)    Anion gap 16 (*)    All other components within normal limits  CBC WITH DIFFERENTIAL/PLATELET - Abnormal; Notable for the following components:   RBC 3.75 (*)    MCV 107.2 (*)    MCH 36.3 (*)    Platelets 93 (*)    Neutro Abs 8.4 (*)    Lymphs Abs 0.3 (*)    All other components within normal limits  I-STAT CG4 LACTIC ACID, ED - Abnormal; Notable for the following components:   Lactic Acid, Venous 4.37 (*)    All other components within normal limits  I-STAT ARTERIAL BLOOD GAS, ED - Abnormal; Notable for the following components:   pH, Arterial 7.319 (*)    pCO2 arterial 55.9 (*)    Bicarbonate 28.6 (*)    All other components within normal limits  I-STAT CG4 LACTIC ACID, ED  - Abnormal; Notable for the following components:   Lactic Acid, Venous 3.10 (*)    All other components within normal limits  CBG MONITORING, ED - Abnormal; Notable for the following components:   Glucose-Capillary 189 (*)    All other components within normal limits  CULTURE, BLOOD (ROUTINE X 2)  CULTURE, BLOOD (ROUTINE X 2)  TROPONIN I    EKG  EKG Interpretation  Date/Time:  Friday October 16 2017 12:31:13 EST Ventricular Rate:  102 PR Interval:    QRS Duration: 91 QT Interval:  367 QTC Calculation: 479 R Axis:   28 Text Interpretation:  Sinus tachycardia Paired ventricular premature complexes Aberrant conduction of SV complex(es) Low voltage, precordial leads Borderline prolonged QT interval No significant change since last tracing Confirmed by Jacalyn Lefevre [phone]) on 10/16/2017 12:33:55 PM       Radiology Dg Chest 2 View  Result Date: 10/16/2017 CLINICAL DATA:  Decreased oxygen saturation.  Lethargy. EXAM: CHEST  2 VIEW COMPARISON:  August 03, 2015 FINDINGS: There is fibrotic change in the lung bases. There is no edema or consolidation. Heart is mildly enlarged with pulmonary vascularity within normal limits. There is aortic atherosclerosis. There is evidence of a prior fracture of the sternum with screw and plate fixation. There old healed rib fractures bilaterally. There are compression fractures at several levels in the mid and lower thoracic as well as upper lumbar spine regions. There is evidence of prior kyphoplasty and upper lumbar vertebral body. IMPRESSION: Bibasilar fibrosis. No edema or consolidation. Stable cardiac prominence. There is aortic atherosclerosis. Evidence of bony trauma at multiple sites, stable. Aortic Atherosclerosis (ICD10-I70.0). Electronically Signed   By: Bretta Bang III M.D.   On: 10/16/2017 10:40   Ct Chest W Contrast  Result Date: 10/16/2017 CLINICAL DATA:  Mid chest pain with back pain and shortness of breath. Blunt abdominal trauma.  Flu. EXAM:  CT CHEST, ABDOMEN, AND PELVIS WITH CONTRAST TECHNIQUE: Multidetector CT imaging of the chest, abdomen and pelvis was performed following the standard protocol during bolus administration of intravenous contrast. CONTRAST:  ISOVUE-300 IOPAMIDOL (ISOVUE-300) INJECTION 61% COMPARISON:  Lumbar MRI 11/28/2016.  Abdominopelvic CT 08/06/2007. FINDINGS: CT CHEST FINDINGS Cardiovascular: Atherosclerosis of the aorta, great vessels and coronary arteries. No acute vascular findings or evidence of mediastinal hematoma. The heart size is normal. There is no pericardial effusion. Mediastinum/Nodes: There are no enlarged mediastinal, hilar or axillary lymph nodes. There is a small hiatal hernia. The trachea and thyroid gland appear unremarkable. Lungs/Pleura: No pneumothorax or significant pleural effusion. There are patchy dependent airspace opacities at both lung bases with associated central airway thickening. There are lesser peribronchial opacities in the upper and right middle lobes. In the setting of trauma, some of these could reflect atelectasis, although inflammation is suspected. Musculoskeletal/Chest wall: No definite acute osseous findings. There is an old sternal fracture status post ORIF. This remains anteriorly displaced. There are multiple rib fractures bilaterally. There are multiple compression deformities throughout the thoracic spine. Fractures at T3, T5, T8, T11 and T12 are grossly stable. Fracture at T10 appears progressive from chest radiographs 08/03/2015. Moderate bilateral gynecomastia. CT ABDOMEN AND PELVIS FINDINGS Hepatobiliary: Interval development of severe hepatic steatosis. No focal hepatic lesions are identified. Previous cholecystectomy without significant biliary dilatation. Pancreas: Severe pancreatic atrophy. No focal surrounding inflammation. Spleen: Normal in size without focal abnormality. Adrenals/Urinary Tract: Both adrenal glands appear normal. The kidneys appear normal  without evidence of urinary tract calculus, suspicious lesion or hydronephrosis. No bladder abnormalities are seen. Stomach/Bowel: The stomach and small bowel appear normal. There is generalized colonic wall thickening without focal surrounding inflammation. The appendix appears normal. No evidence of bowel obstruction or perforation. Vascular/Lymphatic: There are no enlarged abdominal or pelvic lymph nodes. Diffuse aortic and branch vessel atherosclerosis. No acute vascular findings. No significant venous findings. Reproductive: The prostate gland and seminal vesicles appear unremarkable. Other: A small amount of ascites is present. There is edema throughout the intra-abdominal and subcutaneous fat. Musculoskeletal: Multiple lumbar compression deformities are present, stable from lumbar MRI 10 months ago. Previous L3 spinal augmentation. No definite acute osseous findings. IMPRESSION: 1. No acute clear posttraumatic findings in the chest, abdomen or pelvis. Multiple fractures are demonstrated, mostly stable. Fracture at T10 appears progressive from 2016. 2. Patchy bilateral pulmonary opacities, probably due to a combination of atelectasis and infection/inflammation. 3. Severe hepatic steatosis and severe pancreatic atrophy. 4. Generalized colonic wall thickening may be secondary to underlying suspected liver disease or reflect diffuse colitis. Mild ascites with generalized edema throughout the abdominal fat. 5.  Aortic Atherosclerosis (ICD10-I70.0). Electronically Signed   By: Carey Bullocks M.D.   On: 10/16/2017 14:00   Ct Abdomen Pelvis W Contrast  Result Date: 10/16/2017 CLINICAL DATA:  Mid chest pain with back pain and shortness of breath. Blunt abdominal trauma. Flu. EXAM: CT CHEST, ABDOMEN, AND PELVIS WITH CONTRAST TECHNIQUE: Multidetector CT imaging of the chest, abdomen and pelvis was performed following the standard protocol during bolus administration of intravenous contrast. CONTRAST:   ISOVUE-300 IOPAMIDOL (ISOVUE-300) INJECTION 61% COMPARISON:  Lumbar MRI 11/28/2016.  Abdominopelvic CT 08/06/2007. FINDINGS: CT CHEST FINDINGS Cardiovascular: Atherosclerosis of the aorta, great vessels and coronary arteries. No acute vascular findings or evidence of mediastinal hematoma. The heart size is normal. There is no pericardial effusion. Mediastinum/Nodes: There are no enlarged mediastinal, hilar or axillary lymph nodes. There is a small hiatal hernia. The trachea and  thyroid gland appear unremarkable. Lungs/Pleura: No pneumothorax or significant pleural effusion. There are patchy dependent airspace opacities at both lung bases with associated central airway thickening. There are lesser peribronchial opacities in the upper and right middle lobes. In the setting of trauma, some of these could reflect atelectasis, although inflammation is suspected. Musculoskeletal/Chest wall: No definite acute osseous findings. There is an old sternal fracture status post ORIF. This remains anteriorly displaced. There are multiple rib fractures bilaterally. There are multiple compression deformities throughout the thoracic spine. Fractures at T3, T5, T8, T11 and T12 are grossly stable. Fracture at T10 appears progressive from chest radiographs 08/03/2015. Moderate bilateral gynecomastia. CT ABDOMEN AND PELVIS FINDINGS Hepatobiliary: Interval development of severe hepatic steatosis. No focal hepatic lesions are identified. Previous cholecystectomy without significant biliary dilatation. Pancreas: Severe pancreatic atrophy. No focal surrounding inflammation. Spleen: Normal in size without focal abnormality. Adrenals/Urinary Tract: Both adrenal glands appear normal. The kidneys appear normal without evidence of urinary tract calculus, suspicious lesion or hydronephrosis. No bladder abnormalities are seen. Stomach/Bowel: The stomach and small bowel appear normal. There is generalized colonic wall thickening without focal  surrounding inflammation. The appendix appears normal. No evidence of bowel obstruction or perforation. Vascular/Lymphatic: There are no enlarged abdominal or pelvic lymph nodes. Diffuse aortic and branch vessel atherosclerosis. No acute vascular findings. No significant venous findings. Reproductive: The prostate gland and seminal vesicles appear unremarkable. Other: A small amount of ascites is present. There is edema throughout the intra-abdominal and subcutaneous fat. Musculoskeletal: Multiple lumbar compression deformities are present, stable from lumbar MRI 10 months ago. Previous L3 spinal augmentation. No definite acute osseous findings. IMPRESSION: 1. No acute clear posttraumatic findings in the chest, abdomen or pelvis. Multiple fractures are demonstrated, mostly stable. Fracture at T10 appears progressive from 2016. 2. Patchy bilateral pulmonary opacities, probably due to a combination of atelectasis and infection/inflammation. 3. Severe hepatic steatosis and severe pancreatic atrophy. 4. Generalized colonic wall thickening may be secondary to underlying suspected liver disease or reflect diffuse colitis. Mild ascites with generalized edema throughout the abdominal fat. 5.  Aortic Atherosclerosis (ICD10-I70.0). Electronically Signed   By: Carey BullocksWilliam  Veazey M.D.   On: 10/16/2017 14:00    Procedures Procedures (including critical care time)  Medications Ordered in ED Medications  albuterol (PROVENTIL,VENTOLIN) solution continuous neb (0 mg/hr Nebulization Stopped 10/16/17 1249)  cefTRIAXone (ROCEPHIN) 1 g in sodium chloride 0.9 % 100 mL IVPB (not administered)  azithromycin (ZITHROMAX) 500 mg in sodium chloride 0.9 % 250 mL IVPB (not administered)  sodium chloride 0.9 % bolus 1,000 mL (0 mLs Intravenous Stopped 10/16/17 1249)  methylPREDNISolone sodium succinate (SOLU-MEDROL) 125 mg/2 mL injection 125 mg (125 mg Intravenous Given 10/16/17 1143)  oseltamivir (TAMIFLU) capsule 75 mg (75 mg Oral Given  10/16/17 1144)  iopamidol (ISOVUE-300) 61 % injection (100 mLs  Contrast Given 10/16/17 1259)     Initial Impression / Assessment and Plan / ED Course  I have reviewed the triage vital signs and the nursing notes.  Pertinent labs & imaging results that were available during my care of the patient were reviewed by me and considered in my medical decision making (see chart for details).    Breathing worsened, so pt was put on bipap.  He looks much more comfortable now.  Pt has pna on ct, so I did order rocephin/zithromax and blood cultures.  Lactic acid is improving with IVFs.  CRITICAL CARE Performed by: Jacalyn LefevreJulie Raiden Yearwood   Total critical care time: 30 minutes  Critical care time was  exclusive of separately billable procedures and treating other patients.  Critical care was necessary to treat or prevent imminent or life-threatening deterioration.  Critical care was time spent personally by me on the following activities: development of treatment plan with patient and/or surrogate as well as nursing, discussions with consultants, evaluation of patient's response to treatment, examination of patient, obtaining history from patient or surrogate, ordering and performing treatments and interventions, ordering and review of laboratory studies, ordering and review of radiographic studies, pulse oximetry and re-evaluation of patient's condition.  Pt d/w triad for admission.  Final Clinical Impressions(s) / ED Diagnoses   Final diagnoses:  Influenza A  Community acquired pneumonia, unspecified laterality  Acute respiratory failure with hypoxia [facility])  Peripheral edema    ED Discharge Orders    None       Jacalyn Lefevre, MD 10/16/17 1437

## 2017-10-16 NOTE — Progress Notes (Signed)
Found pt on 11L nasal canula. Switched pt to [place] HFNC 7L. Pt sat 96%, resting comfortably- no distress. RT will continue to monitor and titrate O2 as needed.

## 2017-10-17 DIAGNOSIS — J9601 Acute respiratory failure with hypoxia: Secondary | ICD-10-CM

## 2017-10-17 DIAGNOSIS — A419 Sepsis, unspecified organism: Principal | ICD-10-CM

## 2017-10-17 DIAGNOSIS — J101 Influenza due to other identified influenza virus with other respiratory manifestations: Secondary | ICD-10-CM

## 2017-10-17 DIAGNOSIS — Z794 Long term (current) use of insulin: Secondary | ICD-10-CM

## 2017-10-17 DIAGNOSIS — J189 Pneumonia, unspecified organism: Secondary | ICD-10-CM

## 2017-10-17 DIAGNOSIS — I10 Essential (primary) hypertension: Secondary | ICD-10-CM

## 2017-10-17 DIAGNOSIS — E1142 Type 2 diabetes mellitus with diabetic polyneuropathy: Secondary | ICD-10-CM

## 2017-10-17 LAB — GLUCOSE, CAPILLARY
GLUCOSE-CAPILLARY: 278 mg/dL — AB (ref 65–99)
GLUCOSE-CAPILLARY: 153 mg/dL — AB (ref 65–99)
Glucose-Capillary: 118 mg/dL — ABNORMAL HIGH (ref 65–99)

## 2017-10-17 LAB — BASIC METABOLIC PANEL
ANION GAP: 12 (ref 5–15)
BUN: 9 mg/dL (ref 6–20)
CALCIUM: 6.8 mg/dL — AB (ref 8.9–10.3)
CO2: 27 mmol/L (ref 22–32)
Chloride: 95 mmol/L — ABNORMAL LOW (ref 101–111)
Creatinine, Ser: 0.73 mg/dL (ref 0.61–1.24)
GFR calc Af Amer: >60 mL/min (ref >60)
GFR calc non Af Amer: >60 mL/min (ref >60)
Glucose, Bld: 273 mg/dL — ABNORMAL HIGH (ref 65–99)
POTASSIUM: 3.2 mmol/L — AB (ref 3.5–5.1)
SODIUM: 134 mmol/L — AB (ref 135–145)

## 2017-10-17 LAB — CBC
HEMATOCRIT: 37.8 % — AB (ref 39.0–52.0)
Hemoglobin: 12.6 g/dL — ABNORMAL LOW (ref 13.0–17.0)
MCH: 35.9 pg — ABNORMAL HIGH (ref 26.0–34.0)
MCHC: 33.3 g/dL (ref 30.0–36.0)
MCV: 107.7 fL — ABNORMAL HIGH (ref 78.0–100.0)
Platelets: 85 10*3/uL — ABNORMAL LOW (ref 150–400)
RBC: 3.51 MIL/uL — ABNORMAL LOW (ref 4.22–5.81)
RDW: 14.9 % (ref 11.5–15.5)
WBC: 8.8 10*3/uL (ref 4.0–10.5)

## 2017-10-17 LAB — HIV ANTIBODY (ROUTINE TESTING W REFLEX): HIV SCREEN 4TH GENERATION: NONREACTIVE

## 2017-10-17 MED ORDER — MORPHINE SULFATE 15 MG PO TABS
7.5000 mg | ORAL_TABLET | Freq: Three times a day (TID) | ORAL | Status: DC | PRN
  Administered 2017-10-17 – 2017-10-18 (×4): 7.5 mg via ORAL
  Filled 2017-10-17 (×4): qty 1

## 2017-10-17 MED ORDER — POTASSIUM CHLORIDE CRYS ER 20 MEQ PO TBCR
40.0000 meq | EXTENDED_RELEASE_TABLET | ORAL | Status: AC
  Administered 2017-10-17 (×2): 40 meq via ORAL
  Filled 2017-10-17 (×2): qty 2

## 2017-10-17 MED ORDER — IPRATROPIUM-ALBUTEROL 0.5-2.5 (3) MG/3ML IN SOLN
3.0000 mL | Freq: Four times a day (QID) | RESPIRATORY_TRACT | Status: DC
  Administered 2017-10-17 – 2017-10-18 (×6): 3 mL via RESPIRATORY_TRACT
  Filled 2017-10-17 (×5): qty 3

## 2017-10-17 MED ORDER — METHOCARBAMOL 500 MG PO TABS
500.0000 mg | ORAL_TABLET | Freq: Two times a day (BID) | ORAL | Status: DC | PRN
  Administered 2017-10-17 – 2017-10-18 (×2): 500 mg via ORAL
  Filled 2017-10-17 (×3): qty 1

## 2017-10-17 MED ORDER — IBUPROFEN 200 MG PO TABS
600.0000 mg | ORAL_TABLET | Freq: Four times a day (QID) | ORAL | Status: DC | PRN
  Administered 2017-10-17 – 2017-10-18 (×3): 600 mg via ORAL
  Filled 2017-10-17 (×3): qty 3

## 2017-10-17 NOTE — Progress Notes (Signed)
PROGRESS NOTE    JLYN BRACAMONTE  ZOX:[id number] DOB: 09-Apr-1961 DOA: 10/16/2017 PCP: Dettinger, Elige Radon, MD   Brief Narrative: [name] is a 57 y.o. male with medical history significant of HTN, GERD, alcoholic hepatitis with liver cirrhosis, pancreatitis, migraines, osteoporosis, DM type II. He presented with dyspnea and cough and found to have influenza infection and likely pneumonia with associated acute hypoxic respiratory failure. Antibiotics for CAP and Tamiflu started.   Assessment & Plan:   Principal Problem:   Sepsis (HCC) Active Problems:   HTN (hypertension)   Type 2 diabetes mellitus (HCC)   Acute respiratory failure with hypoxia (HCC)   CAP (community acquired pneumonia)   Influenza A   Influenza A infection -Continue Tamiflu  Community acquired pneumonia Acute respiratory failure with hypoxia -Continue Ceftriaxone/azithromycin -Blood cultures pending -Legionella/strep pneumo ur ag -wean O2 for oxygen saturations >92%  Diabetes mellitus, type 2 Hemoglobin A1C of 8.3% -Continue SSI and Lantus  Essential hypertension Controlled. On hydrochlorothiazide as an outpatient   DVT prophylaxis: Lovenox Code Status:   Code Status: Full Code Family Communication: None at bedside Disposition Plan: Discharge when medically stable   Consultants:   None  Procedures:   None  Antimicrobials:  Ceftriaxone  Azithromycin  Tamiflu    Subjective: Dyspnea. Otherwise, feels well.  Objective: Vitals:   10/17/17 0224 10/17/17 0305 10/17/17 0400 10/17/17 0607  BP:   122/84   Pulse: 81 84 76 80  Resp: (!) 22 20 19  (!) 26  Temp:      TempSrc:      SpO2: 95% 100% 97% 97%  Weight:      Height:        Intake/Output Summary (Last 24 hours) at 10/17/2017 0852 Last data filed at 10/17/2017 8119 Gross per 24 hour  Intake 1450 ml  Output 100 ml  Net 1350 ml   Filed Weights   10/16/17 1731  Weight: 89.4 kg (197 lb)    Examination:  General  exam: Appears calm and comfortable Respiratory system: Diminished bilaterally with left sided wheezing. Cardiovascular system: S1 & S2 heard, RRR. No murmurs, rubs, gallops or clicks. Gastrointestinal system: Abdomen is nondistended, soft and nontender. No organomegaly or masses felt. Normal bowel sounds heard. Central nervous system: Alert and oriented. No focal neurological deficits. Extremities: No edema. No calf tenderness Skin: No cyanosis. No rashes Psychiatry: Judgement and insight appear normal. Mood & affect appropriate.     Data Reviewed: I have personally reviewed following labs and imaging studies  CBC: Recent Labs  Lab 10/16/17 1008 10/16/17 1908 10/17/17 0254  WBC 9.7 10.2 8.8  NEUTROABS 8.4* 9.5*  --   HGB 13.6 12.2* 12.6*  HCT 40.2 36.7* 37.8*  MCV 107.2* 106.1* 107.7*  PLT 93* 97* 85*   Basic Metabolic Panel: Recent Labs  Lab 10/16/17 1008 10/16/17 1908 10/17/17 0254  NA 133* 135 134*  K 3.1* 3.4* 3.2*  CL 94* 97* 95*  CO2 23 24 27   GLUCOSE 162* 257* 273*  BUN 10 8 9   CREATININE 0.80 0.75 0.73  CALCIUM 7.4* 7.0* 6.8*   GFR: Estimated Creatinine Clearance: 115.4 mL/min (by C-G formula based on SCr of 0.73 mg/dL). Liver Function Tests: Recent Labs  Lab 10/16/17 1008 10/16/17 1908  AST 337* 281*  ALT 77* 67*  ALKPHOS 259* 228*  BILITOT 3.9* 3.3*  PROT 6.1* 5.7*  ALBUMIN 2.6* 2.3*   No results for input(s): LIPASE, AMYLASE in the last 168 hours. No results for input(s): AMMONIA in  the last 168 hours. Coagulation Profile: Recent Labs  Lab 10/16/17 1908  INR 1.58   Cardiac Enzymes: Recent Labs  Lab 10/16/17 1122  TROPONINI <0.03   BNP (last 3 results) No results for input(s): PROBNP in the last 8760 hours. HbA1C: No results for input(s): HGBA1C in the last 72 hours. CBG: Recent Labs  Lab 10/16/17 1413 10/17/17 0824  GLUCAP 189* 278*   Lipid Profile: No results for input(s): CHOL, HDL, LDLCALC, TRIG, CHOLHDL, LDLDIRECT in the  last 72 hours. Thyroid Function Tests: No results for input(s): TSH, T4TOTAL, FREET4, T3FREE, THYROIDAB in the last 72 hours. Anemia Panel: No results for input(s): VITAMINB12, FOLATE, FERRITIN, TIBC, IRON, RETICCTPCT in the last 72 hours. Sepsis Labs: Recent Labs  Lab 10/16/17 1023 10/16/17 1215 10/16/17 1908  PROCALCITON  --   --  0.81  LATICACIDVEN 4.37* 3.10* 2.9*    Recent Results (from the past 240 hour(s))  MRSA PCR Screening     Status: None   Collection Time: 10/16/17  5:45 PM  Result Value Ref Range Status   MRSA by PCR NEGATIVE NEGATIVE Final    Comment:        The GeneXpert MRSA Assay (FDA approved for NASAL specimens only), is one component of a comprehensive MRSA colonization surveillance program. It is not intended to diagnose MRSA infection nor to guide or monitor treatment for MRSA infections. Performed at [facility], [address], [place], [place] [address]          Radiology Studies: Dg Chest 2 View  Result Date: 10/16/2017 CLINICAL DATA:  Decreased oxygen saturation.  Lethargy. EXAM: CHEST  2 VIEW COMPARISON:  August 03, 2015 FINDINGS: There is fibrotic change in the lung bases. There is no edema or consolidation. Heart is mildly enlarged with pulmonary vascularity within normal limits. There is aortic atherosclerosis. There is evidence of a prior fracture of the sternum with screw and plate fixation. There old healed rib fractures bilaterally. There are compression fractures at several levels in the mid and lower thoracic as well as upper lumbar spine regions. There is evidence of prior kyphoplasty and upper lumbar vertebral body. IMPRESSION: Bibasilar fibrosis. No edema or consolidation. Stable cardiac prominence. There is aortic atherosclerosis. Evidence of bony trauma at multiple sites, stable. Aortic Atherosclerosis (ICD10-I70.0). Electronically Signed   By: Bretta Bang III M.D.   On: 10/16/2017 10:40   Ct Chest W Contrast  Result  Date: 10/16/2017 CLINICAL DATA:  Mid chest pain with back pain and shortness of breath. Blunt abdominal trauma. Flu. EXAM: CT CHEST, ABDOMEN, AND PELVIS WITH CONTRAST TECHNIQUE: Multidetector CT imaging of the chest, abdomen and pelvis was performed following the standard protocol during bolus administration of intravenous contrast. CONTRAST:  ISOVUE-300 IOPAMIDOL (ISOVUE-300) INJECTION 61% COMPARISON:  Lumbar MRI 11/28/2016.  Abdominopelvic CT 08/06/2007. FINDINGS: CT CHEST FINDINGS Cardiovascular: Atherosclerosis of the aorta, great vessels and coronary arteries. No acute vascular findings or evidence of mediastinal hematoma. The heart size is normal. There is no pericardial effusion. Mediastinum/Nodes: There are no enlarged mediastinal, hilar or axillary lymph nodes. There is a small hiatal hernia. The trachea and thyroid gland appear unremarkable. Lungs/Pleura: No pneumothorax or significant pleural effusion. There are patchy dependent airspace opacities at both lung bases with associated central airway thickening. There are lesser peribronchial opacities in the upper and right middle lobes. In the setting of trauma, some of these could reflect atelectasis, although inflammation is suspected. Musculoskeletal/Chest wall: No definite acute osseous findings. There is an  old sternal fracture status post ORIF. This remains anteriorly displaced. There are multiple rib fractures bilaterally. There are multiple compression deformities throughout the thoracic spine. Fractures at T3, T5, T8, T11 and T12 are grossly stable. Fracture at T10 appears progressive from chest radiographs 08/03/2015. Moderate bilateral gynecomastia. CT ABDOMEN AND PELVIS FINDINGS Hepatobiliary: Interval development of severe hepatic steatosis. No focal hepatic lesions are identified. Previous cholecystectomy without significant biliary dilatation. Pancreas: Severe pancreatic atrophy. No focal surrounding inflammation. Spleen: Normal in size  without focal abnormality. Adrenals/Urinary Tract: Both adrenal glands appear normal. The kidneys appear normal without evidence of urinary tract calculus, suspicious lesion or hydronephrosis. No bladder abnormalities are seen. Stomach/Bowel: The stomach and small bowel appear normal. There is generalized colonic wall thickening without focal surrounding inflammation. The appendix appears normal. No evidence of bowel obstruction or perforation. Vascular/Lymphatic: There are no enlarged abdominal or pelvic lymph nodes. Diffuse aortic and branch vessel atherosclerosis. No acute vascular findings. No significant venous findings. Reproductive: The prostate gland and seminal vesicles appear unremarkable. Other: A small amount of ascites is present. There is edema throughout the intra-abdominal and subcutaneous fat. Musculoskeletal: Multiple lumbar compression deformities are present, stable from lumbar MRI 10 months ago. Previous L3 spinal augmentation. No definite acute osseous findings. IMPRESSION: 1. No acute clear posttraumatic findings in the chest, abdomen or pelvis. Multiple fractures are demonstrated, mostly stable. Fracture at T10 appears progressive from 2016. 2. Patchy bilateral pulmonary opacities, probably due to a combination of atelectasis and infection/inflammation. 3. Severe hepatic steatosis and severe pancreatic atrophy. 4. Generalized colonic wall thickening may be secondary to underlying suspected liver disease or reflect diffuse colitis. Mild ascites with generalized edema throughout the abdominal fat. 5.  Aortic Atherosclerosis (ICD10-I70.0). Electronically Signed   By: Carey Bullocks M.D.   On: 10/16/2017 14:00   Ct Abdomen Pelvis W Contrast  Result Date: 10/16/2017 CLINICAL DATA:  Mid chest pain with back pain and shortness of breath. Blunt abdominal trauma. Flu. EXAM: CT CHEST, ABDOMEN, AND PELVIS WITH CONTRAST TECHNIQUE: Multidetector CT imaging of the chest, abdomen and pelvis was  performed following the standard protocol during bolus administration of intravenous contrast. CONTRAST:  ISOVUE-300 IOPAMIDOL (ISOVUE-300) INJECTION 61% COMPARISON:  Lumbar MRI 11/28/2016.  Abdominopelvic CT 08/06/2007. FINDINGS: CT CHEST FINDINGS Cardiovascular: Atherosclerosis of the aorta, great vessels and coronary arteries. No acute vascular findings or evidence of mediastinal hematoma. The heart size is normal. There is no pericardial effusion. Mediastinum/Nodes: There are no enlarged mediastinal, hilar or axillary lymph nodes. There is a small hiatal hernia. The trachea and thyroid gland appear unremarkable. Lungs/Pleura: No pneumothorax or significant pleural effusion. There are patchy dependent airspace opacities at both lung bases with associated central airway thickening. There are lesser peribronchial opacities in the upper and right middle lobes. In the setting of trauma, some of these could reflect atelectasis, although inflammation is suspected. Musculoskeletal/Chest wall: No definite acute osseous findings. There is an old sternal fracture status post ORIF. This remains anteriorly displaced. There are multiple rib fractures bilaterally. There are multiple compression deformities throughout the thoracic spine. Fractures at T3, T5, T8, T11 and T12 are grossly stable. Fracture at T10 appears progressive from chest radiographs 08/03/2015. Moderate bilateral gynecomastia. CT ABDOMEN AND PELVIS FINDINGS Hepatobiliary: Interval development of severe hepatic steatosis. No focal hepatic lesions are identified. Previous cholecystectomy without significant biliary dilatation. Pancreas: Severe pancreatic atrophy. No focal surrounding inflammation. Spleen: Normal in size without focal abnormality. Adrenals/Urinary Tract: Both adrenal glands appear normal. The kidneys appear normal without  evidence of urinary tract calculus, suspicious lesion or hydronephrosis. No bladder abnormalities are seen.  Stomach/Bowel: The stomach and small bowel appear normal. There is generalized colonic wall thickening without focal surrounding inflammation. The appendix appears normal. No evidence of bowel obstruction or perforation. Vascular/Lymphatic: There are no enlarged abdominal or pelvic lymph nodes. Diffuse aortic and branch vessel atherosclerosis. No acute vascular findings. No significant venous findings. Reproductive: The prostate gland and seminal vesicles appear unremarkable. Other: A small amount of ascites is present. There is edema throughout the intra-abdominal and subcutaneous fat. Musculoskeletal: Multiple lumbar compression deformities are present, stable from lumbar MRI 10 months ago. Previous L3 spinal augmentation. No definite acute osseous findings. IMPRESSION: 1. No acute clear posttraumatic findings in the chest, abdomen or pelvis. Multiple fractures are demonstrated, mostly stable. Fracture at T10 appears progressive from 2016. 2. Patchy bilateral pulmonary opacities, probably due to a combination of atelectasis and infection/inflammation. 3. Severe hepatic steatosis and severe pancreatic atrophy. 4. Generalized colonic wall thickening may be secondary to underlying suspected liver disease or reflect diffuse colitis. Mild ascites with generalized edema throughout the abdominal fat. 5.  Aortic Atherosclerosis (ICD10-I70.0). Electronically Signed   By: Carey BullocksWilliam  Veazey M.D.   On: 10/16/2017 14:00   Dg Chest Port 1 View  Result Date: 10/16/2017 CLINICAL DATA:  Shortness of breath.  Hypoxia. EXAM: PORTABLE CHEST 1 VIEW COMPARISON:  CT 10/16/2017 chest x-ray 10/16/2017. FINDINGS: Cardiomegaly with diffuse mild bilateral interstitial prominence. Mild component of CHF may be present. Chronic interstitial changes may also be present. No focal and alveolar infiltrate. No pleural effusion or pneumothorax. Prior median sternotomy. IMPRESSION: Cardiomegaly with mild bilateral interstitial prominence. A mild  component CHF may be present. Chronic interstitial changes may also be present. Electronically Signed   By: Maisie Fushomas  Register   On: 10/16/2017 17:19        Scheduled Meds: . enoxaparin (LOVENOX) injection  40 mg Subcutaneous Q24H  . gabapentin  1,200 mg Oral TID  . guaiFENesin  600 mg Oral BID  . Influenza vac split quadrivalent PF  0.5 mL Intramuscular Tomorrow-1000  . insulin aspart  0-15 Units Subcutaneous TID WC  . insulin glargine  37 Units Subcutaneous [id number]  . ipratropium-albuterol  3 mL Nebulization QID  . oseltamivir  75 mg Oral BID  . pantoprazole  40 mg Oral Daily  . pneumococcal 23 valent vaccine  0.5 mL Intramuscular Tomorrow-1000  . potassium chloride  40 mEq Oral Q4H   Continuous Infusions:   LOS: 1 day     Jacquelin Hawkingalph Laini Urick, MD [facility] 10/17/2017, 8:52 AM Pager: [phone]  If 7PM-7AM, please contact night-coverage [email or web address] Password [email or web address] 10/17/2017, 8:52 AM

## 2017-10-17 NOTE — Plan of Care (Signed)
Patient progressing toward care goals.  Weaned off O2 to RA today w/no complications.  PRN Morphine changed to PO Morphine per patient request, in addition to other PRN meds.  No other issues today.  Will continue to monitor.

## 2017-10-18 DIAGNOSIS — Z23 Encounter for immunization: Secondary | ICD-10-CM | POA: Diagnosis not present

## 2017-10-18 DIAGNOSIS — E1165 Type 2 diabetes mellitus with hyperglycemia: Secondary | ICD-10-CM

## 2017-10-18 DIAGNOSIS — R609 Edema, unspecified: Secondary | ICD-10-CM

## 2017-10-18 LAB — STREP PNEUMONIAE URINARY ANTIGEN: STREP PNEUMO URINARY ANTIGEN: NEGATIVE

## 2017-10-18 LAB — CBC
HEMATOCRIT: 36.5 % — AB (ref 39.0–52.0)
Hemoglobin: 12.2 g/dL — ABNORMAL LOW (ref 13.0–17.0)
MCH: 35.7 pg — AB (ref 26.0–34.0)
MCHC: 33.4 g/dL (ref 30.0–36.0)
MCV: 106.7 fL — ABNORMAL HIGH (ref 78.0–100.0)
Platelets: 85 10*3/uL — ABNORMAL LOW (ref 150–400)
RBC: 3.42 MIL/uL — AB (ref 4.22–5.81)
RDW: 14.9 % (ref 11.5–15.5)
WBC: 6.7 10*3/uL (ref 4.0–10.5)

## 2017-10-18 LAB — BASIC METABOLIC PANEL
ANION GAP: 13 (ref 5–15)
BUN: 11 mg/dL (ref 6–20)
CALCIUM: 7.1 mg/dL — AB (ref 8.9–10.3)
CO2: 24 mmol/L (ref 22–32)
Chloride: 97 mmol/L — ABNORMAL LOW (ref 101–111)
Creatinine, Ser: 0.65 mg/dL (ref 0.61–1.24)
Glucose, Bld: 39 mg/dL — CL (ref 65–99)
POTASSIUM: 3.4 mmol/L — AB (ref 3.5–5.1)
Sodium: 134 mmol/L — ABNORMAL LOW (ref 135–145)

## 2017-10-18 LAB — GLUCOSE, CAPILLARY
GLUCOSE-CAPILLARY: 63 mg/dL — AB (ref 65–99)
Glucose-Capillary: 82 mg/dL (ref 65–99)
Glucose-Capillary: 76 mg/dL (ref 65–99)

## 2017-10-18 LAB — HIV ANTIBODY (ROUTINE TESTING W REFLEX): HIV SCREEN 4TH GENERATION: NONREACTIVE

## 2017-10-18 MED ORDER — AZITHROMYCIN 250 MG PO TABS: 250.0000 mg | ORAL_TABLET | Freq: Every day | ORAL | 0 refills | Status: AC

## 2017-10-18 MED ORDER — GUAIFENESIN ER 600 MG PO TB12: 1200.0000 mg | ORAL_TABLET | Freq: Two times a day (BID) | ORAL | 0 refills | Status: DC

## 2017-10-18 MED ORDER — INSULIN GLARGINE 100 UNIT/ML SOLOSTAR PEN: 30.0000 [IU] | PEN_INJECTOR | Freq: Every day | SUBCUTANEOUS | Status: DC

## 2017-10-18 MED ORDER — INSULIN GLARGINE 100 UNIT/ML ~~LOC~~ SOLN
30.0000 [IU] | Freq: Every day | SUBCUTANEOUS | Status: DC
  Administered 2017-10-18: 30 [IU] via SUBCUTANEOUS
  Filled 2017-10-18: qty 0.3

## 2017-10-18 MED ORDER — CEFPODOXIME PROXETIL 200 MG PO TABS: 200.0000 mg | ORAL_TABLET | Freq: Two times a day (BID) | ORAL | 0 refills | Status: DC

## 2017-10-18 MED ORDER — OSELTAMIVIR PHOSPHATE 75 MG PO CAPS: 75.0000 mg | ORAL_CAPSULE | Freq: Two times a day (BID) | ORAL | 0 refills | Status: AC

## 2017-10-18 NOTE — Discharge Instructions (Signed)

## 2017-10-18 NOTE — Progress Notes (Signed)
Hypoglycemic Event  CBG: 39 on AM labs (recheck on FSBS 63)  Treatment: 15 GM carbohydrate snack  Symptoms: None  Follow-up CBG: Time:0527 CBG Result:83  Possible Reasons for Event: Unknown  Comments/MD notified:Hypoglycemia protocol followed, no additional interventions at this time.  WIll continue to monitor.  Pt verbalizes understanding to call w/ symptoms of hypoglycemia.     Jenny Lai, Terrilyn SaverKatie Lynn

## 2017-10-18 NOTE — Progress Notes (Signed)
Patient states he feels good this morning; anticipating discharge home.  Patient up, ambulates in room w/steady gait; stands at bedside momentarily to stretch, then sits in bedside chair awaiting breakfast.  Given Ibuprofen per PRN orders.  Will continue to monitor CBGs.  Patient on RA at this time w/sats in low 90s.

## 2017-10-18 NOTE — Discharge Summary (Addendum)
Physician Discharge Summary  [name] XBM:[id number] DOB: 05/21/1961 DOA: 10/16/2017  PCP: Dettinger, Fransisca Kaufmann, MD  Admit date: 10/16/2017 Discharge date: 10/18/2017  Admitted From: Home Disposition: Home  Recommendations for Outpatient Follow-up:  1. Follow up with PCP in 1 week 2. Please obtain BMP/CBC in one week 3. Please follow up on the following pending results: Blood culture final result; legionella  Home Health: None Equipment/Devices: None  Discharge Condition: Stable CODE STATUS: Full code Diet recommendation: Heart healthy   Brief/Interim Summary:  Admission HPI written by York Grice, PA-C   Chief Complaint: SOB, cough  HPI: [name] is a 57 y.o. male with medical history significant of HTN, GERD, alcoholic hepatitis with liver cirrhosis, pancreatitis, migraines, osteoporosis, DM type II who presented to the ED with complaints of severe SOB. Patient was seen today at his PCP office with complaints of progressive dyspnea and was noted that his and his SpO2 dropped to 73% on RA. Patient was transferred to [facility] for further care. Patient reported that his symptoms of progressive shortness of breath, cough with greenish sputum, body ache, fevers were persistent for probably 3 days. He also complains of severe back pain and has a history of lumbar spine surgery and severe pain of the lower extremities due to neuropathy.  ED Course: In the ED his VS showed temperature of 99.4 degrees of Fahrenheit, pulse 109, respirations 27, blood pressure 151/89 Chest x-ray showed no edema or consolidation. EKG showed sinus tachycardia with PVCs, nonspecific ST-T wave changes. Blood work demonstrated elevated 4.37 with improved with hydration and decreased to 3.10 CMP showed hypokalemia with potassium 3.1, hyponatremia with sodium 133, glucose 162, AST 337, alkaline phosphatase 259, total bilirubin 3.9 probably associated with alcoholic hepatitis and liver cirrhosis, BUN  10 creatinine 0.80 CBC demonstrated normal white blood cell count 9700 and normal hemoglobin 13.6.    Hospital course:  Influenza A infection Tamiflu started. End date of 10/21/2017  Community acquired pneumonia Acute respiratory failure with hypoxia Met sepsis criteria on admission. Started on ceftriaxone and azithromycin. Chest x-ray not clear for pneumonia. Procalcitonin elevated at 0.81. Patient initially required Bipap which was weaned to room air. Strep pneumo antigen negative. Legionella pending.   Diabetes mellitus, type 2 Hemoglobin A1C of 8.3%. Hypoglycemic episode with blood sugar of 39. Decreased Insulin to 30 units daily. Follow-up as an outpatient.  Essential hypertension Controlled. On hydrochlorothiazide as an outpatient   Discharge Diagnoses:  Principal Problem:   Influenza A Active Problems:   HTN (hypertension)   Type 2 diabetes mellitus ([place])   Acute respiratory failure with hypoxia (HCC)   CAP (community acquired pneumonia)   Sepsis [facility])    Discharge Instructions  Discharge Instructions    Call MD for:  difficulty breathing, headache or visual disturbances   Complete by:  As directed    Call MD for:  temperature >100.4   Complete by:  As directed    Diet - low sodium heart healthy   Complete by:  As directed    Increase activity slowly   Complete by:  As directed      Allergies as of 10/18/2017      Reactions   Temsirolimus Swelling   Pt does not recognize this medication   Ketorolac Tromethamine Hives      Medication List    STOP taking these medications   Potassium Chloride ER 20 MEQ Tbcr     TAKE these medications   albuterol 108 (90 Base) MCG/ACT inhaler Commonly  known as:  PROVENTIL HFA;VENTOLIN HFA Inhale 2 puffs into the lungs every 6 (six) hours as needed for wheezing or shortness of breath.   azithromycin 250 MG tablet Commonly known as:  ZITHROMAX Take 1 tablet (250 mg total) by mouth daily for 4 days.    carboxymethylcellulose 0.5 % Soln Commonly known as:  REFRESH PLUS Place 1 drop into both eyes 3 (three) times daily as needed (dry eyes).   cefpodoxime 200 MG tablet Commonly known as:  VANTIN Take 1 tablet (200 mg total) by mouth 2 (two) times daily for 7 days.   gabapentin 400 MG capsule Commonly known as:  NEURONTIN Take 1,200 mg by mouth 3 (three) times daily.   guaiFENesin 600 MG 12 hr tablet Commonly known as:  MUCINEX Take 2 tablets (1,200 mg total) by mouth 2 (two) times daily. What changed:  how much to take   hydrochlorothiazide 25 MG tablet Commonly known as:  HYDRODIURIL Take 12.5 mg by mouth 2 (two) times daily.   ibuprofen 200 MG tablet Commonly known as:  ADVIL,MOTRIN Take 600 mg by mouth every 6 (six) hours as needed (back pain).   Insulin Glargine 100 UNIT/ML Solostar Pen Commonly known as:  LANTUS SOLOSTAR Inject 30 Units into the skin daily. What changed:  See the new instructions.   Magnesium 500 MG Tabs Take 500 mg by mouth daily.   methocarbamol 500 MG tablet Commonly known as:  ROBAXIN Take 500 mg by mouth 2 (two) times daily as needed for muscle spasms.   morphine 15 MG tablet Commonly known as:  MSIR Take 15 mg by mouth 3 (three) times daily.   NOVOLOG FLEXPEN 100 UNIT/ML FlexPen Generic drug:  insulin aspart INJECT 7 TO 10 UNITS  SUBCUTANEOUSLY UP TO 4  TIMES DAILY; MAXIMUM DAILY  DOSE = 40 UNITS What changed:  See the new instructions.   oseltamivir 75 MG capsule Commonly known as:  TAMIFLU Take 1 capsule (75 mg total) by mouth 2 (two) times daily for 3 days.   pantoprazole 40 MG tablet Commonly known as:  PROTONIX Take 40 mg by mouth daily.   polyethylene glycol packet Commonly known as:  MIRALAX / GLYCOLAX Take 17 g by mouth 2 (two) times daily.   POTASSIUM PO Take 1 tablet by mouth at bedtime as needed (leg cramps).      Follow-up Information    Dettinger, Fransisca Kaufmann, MD. Schedule an appointment as soon as possible for a  visit in 1 week(s).   Specialties:  Family Medicine, Cardiology Why:  Hospital follow-up Contact information: [address] [place] [address] [phone]          Allergies  Allergen Reactions  . Temsirolimus Swelling    Pt does not recognize this medication  . Ketorolac Tromethamine Hives    Consultations:  None   Procedures/Studies: Dg Chest 2 View  Result Date: 10/16/2017 CLINICAL DATA:  Decreased oxygen saturation.  Lethargy. EXAM: CHEST  2 VIEW COMPARISON:  August 03, 2015 FINDINGS: There is fibrotic change in the lung bases. There is no edema or consolidation. Heart is mildly enlarged with pulmonary vascularity within normal limits. There is aortic atherosclerosis. There is evidence of a prior fracture of the sternum with screw and plate fixation. There old healed rib fractures bilaterally. There are compression fractures at several levels in the mid and lower thoracic as well as upper lumbar spine regions. There is evidence of prior kyphoplasty and upper lumbar vertebral body. IMPRESSION: Bibasilar fibrosis. No edema  or consolidation. Stable cardiac prominence. There is aortic atherosclerosis. Evidence of bony trauma at multiple sites, stable. Aortic Atherosclerosis (ICD10-I70.0). Electronically Signed   By: Lowella Grip III M.D.   On: 10/16/2017 10:40   Ct Chest W Contrast  Result Date: 10/16/2017 CLINICAL DATA:  Mid chest pain with back pain and shortness of breath. Blunt abdominal trauma. Flu. EXAM: CT CHEST, ABDOMEN, AND PELVIS WITH CONTRAST TECHNIQUE: Multidetector CT imaging of the chest, abdomen and pelvis was performed following the standard protocol during bolus administration of intravenous contrast. CONTRAST:  161m ISOVUE-300 IOPAMIDOL (ISOVUE-300) INJECTION 61% COMPARISON:  Lumbar MRI 11/28/2016.  Abdominopelvic CT 08/06/2007. FINDINGS: CT CHEST FINDINGS Cardiovascular: Atherosclerosis of the aorta, great vessels and coronary arteries. No acute vascular  findings or evidence of mediastinal hematoma. The heart size is normal. There is no pericardial effusion. Mediastinum/Nodes: There are no enlarged mediastinal, hilar or axillary lymph nodes. There is a small hiatal hernia. The trachea and thyroid gland appear unremarkable. Lungs/Pleura: No pneumothorax or significant pleural effusion. There are patchy dependent airspace opacities at both lung bases with associated central airway thickening. There are lesser peribronchial opacities in the upper and right middle lobes. In the setting of trauma, some of these could reflect atelectasis, although inflammation is suspected. Musculoskeletal/Chest wall: No definite acute osseous findings. There is an old sternal fracture status post ORIF. This remains anteriorly displaced. There are multiple rib fractures bilaterally. There are multiple compression deformities throughout the thoracic spine. Fractures at T3, T5, T8, T11 and T12 are grossly stable. Fracture at T10 appears progressive from chest radiographs 08/03/2015. Moderate bilateral gynecomastia. CT ABDOMEN AND PELVIS FINDINGS Hepatobiliary: Interval development of severe hepatic steatosis. No focal hepatic lesions are identified. Previous cholecystectomy without significant biliary dilatation. Pancreas: Severe pancreatic atrophy. No focal surrounding inflammation. Spleen: Normal in size without focal abnormality. Adrenals/Urinary Tract: Both adrenal glands appear normal. The kidneys appear normal without evidence of urinary tract calculus, suspicious lesion or hydronephrosis. No bladder abnormalities are seen. Stomach/Bowel: The stomach and small bowel appear normal. There is generalized colonic wall thickening without focal surrounding inflammation. The appendix appears normal. No evidence of bowel obstruction or perforation. Vascular/Lymphatic: There are no enlarged abdominal or pelvic lymph nodes. Diffuse aortic and branch vessel atherosclerosis. No acute vascular  findings. No significant venous findings. Reproductive: The prostate gland and seminal vesicles appear unremarkable. Other: A small amount of ascites is present. There is edema throughout the intra-abdominal and subcutaneous fat. Musculoskeletal: Multiple lumbar compression deformities are present, stable from lumbar MRI 10 months ago. Previous L3 spinal augmentation. No definite acute osseous findings. IMPRESSION: 1. No acute clear posttraumatic findings in the chest, abdomen or pelvis. Multiple fractures are demonstrated, mostly stable. Fracture at T10 appears progressive from 2016. 2. Patchy bilateral pulmonary opacities, probably due to a combination of atelectasis and infection/inflammation. 3. Severe hepatic steatosis and severe pancreatic atrophy. 4. Generalized colonic wall thickening may be secondary to underlying suspected liver disease or reflect diffuse colitis. Mild ascites with generalized edema throughout the abdominal fat. 5.  Aortic Atherosclerosis (ICD10-I70.0). Electronically Signed   By: WRichardean SaleM.D.   On: 10/16/2017 14:00   Ct Abdomen Pelvis W Contrast  Result Date: 10/16/2017 CLINICAL DATA:  Mid chest pain with back pain and shortness of breath. Blunt abdominal trauma. Flu. EXAM: CT CHEST, ABDOMEN, AND PELVIS WITH CONTRAST TECHNIQUE: Multidetector CT imaging of the chest, abdomen and pelvis was performed following the standard protocol during bolus administration of intravenous contrast. CONTRAST:  1034mISOVUE-300 IOPAMIDOL (ISOVUE-300) INJECTION  61% COMPARISON:  Lumbar MRI 11/28/2016.  Abdominopelvic CT 08/06/2007. FINDINGS: CT CHEST FINDINGS Cardiovascular: Atherosclerosis of the aorta, great vessels and coronary arteries. No acute vascular findings or evidence of mediastinal hematoma. The heart size is normal. There is no pericardial effusion. Mediastinum/Nodes: There are no enlarged mediastinal, hilar or axillary lymph nodes. There is a small hiatal hernia. The trachea and  thyroid gland appear unremarkable. Lungs/Pleura: No pneumothorax or significant pleural effusion. There are patchy dependent airspace opacities at both lung bases with associated central airway thickening. There are lesser peribronchial opacities in the upper and right middle lobes. In the setting of trauma, some of these could reflect atelectasis, although inflammation is suspected. Musculoskeletal/Chest wall: No definite acute osseous findings. There is an old sternal fracture status post ORIF. This remains anteriorly displaced. There are multiple rib fractures bilaterally. There are multiple compression deformities throughout the thoracic spine. Fractures at T3, T5, T8, T11 and T12 are grossly stable. Fracture at T10 appears progressive from chest radiographs 08/03/2015. Moderate bilateral gynecomastia. CT ABDOMEN AND PELVIS FINDINGS Hepatobiliary: Interval development of severe hepatic steatosis. No focal hepatic lesions are identified. Previous cholecystectomy without significant biliary dilatation. Pancreas: Severe pancreatic atrophy. No focal surrounding inflammation. Spleen: Normal in size without focal abnormality. Adrenals/Urinary Tract: Both adrenal glands appear normal. The kidneys appear normal without evidence of urinary tract calculus, suspicious lesion or hydronephrosis. No bladder abnormalities are seen. Stomach/Bowel: The stomach and small bowel appear normal. There is generalized colonic wall thickening without focal surrounding inflammation. The appendix appears normal. No evidence of bowel obstruction or perforation. Vascular/Lymphatic: There are no enlarged abdominal or pelvic lymph nodes. Diffuse aortic and branch vessel atherosclerosis. No acute vascular findings. No significant venous findings. Reproductive: The prostate gland and seminal vesicles appear unremarkable. Other: A small amount of ascites is present. There is edema throughout the intra-abdominal and subcutaneous fat.  Musculoskeletal: Multiple lumbar compression deformities are present, stable from lumbar MRI 10 months ago. Previous L3 spinal augmentation. No definite acute osseous findings. IMPRESSION: 1. No acute clear posttraumatic findings in the chest, abdomen or pelvis. Multiple fractures are demonstrated, mostly stable. Fracture at T10 appears progressive from 2016. 2. Patchy bilateral pulmonary opacities, probably due to a combination of atelectasis and infection/inflammation. 3. Severe hepatic steatosis and severe pancreatic atrophy. 4. Generalized colonic wall thickening may be secondary to underlying suspected liver disease or reflect diffuse colitis. Mild ascites with generalized edema throughout the abdominal fat. 5.  Aortic Atherosclerosis (ICD10-I70.0). Electronically Signed   By: Richardean Sale M.D.   On: 10/16/2017 14:00   Dg Chest Port 1 View  Result Date: 10/16/2017 CLINICAL DATA:  Shortness of breath.  Hypoxia. EXAM: PORTABLE CHEST 1 VIEW COMPARISON:  CT 10/16/2017 chest x-ray 10/16/2017. FINDINGS: Cardiomegaly with diffuse mild bilateral interstitial prominence. Mild component of CHF may be present. Chronic interstitial changes may also be present. No focal and alveolar infiltrate. No pleural effusion or pneumothorax. Prior median sternotomy. IMPRESSION: Cardiomegaly with mild bilateral interstitial prominence. A mild component CHF may be present. Chronic interstitial changes may also be present. Electronically Signed   By: Marcello Moores  Register   On: 10/16/2017 17:19      Subjective: No dyspnea or chest pain today. Cough improved.  Discharge Exam: Vitals:   10/18/17 0719 10/18/17 0742  BP:  134/88  Pulse:    Resp:    Temp:  97.9 F (36.6 C)  SpO2: 92%    Vitals:   10/17/17 1947 10/18/17 0716 10/18/17 0719 10/18/17 0742  BP:  134/88  Pulse:      Resp:      Temp:    97.9 F (36.6 C)  TempSrc:    Oral  SpO2: 94% 96% 92%   Weight:      Height:        General: Pt is alert, awake,  not in acute distress Cardiovascular: RRR, S1/S2 +, no rubs, no gallops Respiratory: Rales bilaterally Abdominal: Soft, NT, ND, bowel sounds + Extremities: LE pitting edema, no cyanosis    The results of significant diagnostics from this hospitalization (including imaging, microbiology, ancillary and laboratory) are listed below for reference.     Microbiology: Recent Results (from the past 240 hour(s))  Blood culture (routine x 2)     Status: None (Preliminary result)   Collection Time: 10/16/17  2:05 PM  Result Value Ref Range Status   Specimen Description BLOOD LEFT HAND  Final   Special Requests IN PEDIATRIC BOTTLE Blood Culture adequate volume  Final   Culture   Final    NO GROWTH < 24 HOURS Performed at [facility], [address] [place], [place] [address]    Report Status PENDING  Incomplete  Blood culture (routine x 2)     Status: None (Preliminary result)   Collection Time: 10/16/17  2:10 PM  Result Value Ref Range Status   Specimen Description BLOOD LEFT FOREARM  Final   Special Requests   Final    BOTTLES DRAWN AEROBIC AND ANAEROBIC Blood Culture adequate volume   Culture   Final    NO GROWTH < 24 HOURS Performed at [facility], [place] [address] [place], [place] [address]    Report Status PENDING  Incomplete  MRSA PCR Screening     Status: None   Collection Time: 10/16/17  5:45 PM  Result Value Ref Range Status   MRSA by PCR NEGATIVE NEGATIVE Final    Comment:        The GeneXpert MRSA Assay (FDA approved for NASAL specimens only), is one component of a comprehensive MRSA colonization surveillance program. It is not intended to diagnose MRSA infection nor to guide or monitor treatment for MRSA infections. Performed at [facility], [place] [address] [place],  [address]      Labs: BNP (last 3 results) No results for input(s): BNP in the last 8760 hours. Basic Metabolic Panel: Recent Labs  Lab 10/16/17 1008 10/16/17 1908  10/17/17 0254 10/18/17 0234  NA 133* 135 134* 134*  K 3.1* 3.4* 3.2* 3.4*  CL 94* 97* 95* 97*  CO2 _0 GLUCOSE 162* 257* 273* 39*  BUN _1 CREATININE 0.80 0.75 0.73 0.65  CALCIUM 7.4* 7.0* 6.8* 7.1*   Liver Function Tests: Recent Labs  Lab 10/16/17 1008 10/16/17 1908  AST 337* 281*  ALT 77* 67*  ALKPHOS 259* 228*  BILITOT 3.9* 3.3*  PROT 6.1* 5.7*  ALBUMIN 2.6* 2.3*   No results for input(s): LIPASE, AMYLASE in the last 168 hours. No results for input(s): AMMONIA in the last 168 hours. CBC: Recent Labs  Lab 10/16/17 1008 10/16/17 1908 10/17/17 0254 10/18/17 0234  WBC 9.7 10.2 8.8 6.7  NEUTROABS 8.4* 9.5*  --   --   HGB 13.6 12.2* 12.6* 12.2*  HCT 40.2 36.7* 37.8* 36.5*  MCV 107.2* 106.1* 107.7* 106.7*  PLT 93* 97* 85* 85*   Cardiac Enzymes: Recent Labs  Lab 10/16/17 1122  TROPONINI <0.03   BNP: Invalid  input(s): POCBNP CBG: Recent Labs  Lab 10/17/17 1244 10/17/17 1742 10/18/17 0509 10/18/17 0526 10/18/17 0803  GLUCAP 153* 118* 63* 82 76   D-Dimer No results for input(s): DDIMER in the last 72 hours. Hgb A1c No results for input(s): HGBA1C in the last 72 hours. Lipid Profile No results for input(s): CHOL, HDL, LDLCALC, TRIG, CHOLHDL, LDLDIRECT in the last 72 hours. Thyroid function studies No results for input(s): TSH, T4TOTAL, T3FREE, THYROIDAB in the last 72 hours.  Invalid input(s): FREET3 Anemia work up No results for input(s): VITAMINB12, FOLATE, FERRITIN, TIBC, IRON, RETICCTPCT in the last 72 hours. Urinalysis    Component Value Date/Time   COLORURINE YELLOW 01/17/2015 [place] 01/17/2015 1305   LABSPEC 1.014 01/17/2015 1305   PHURINE 5.0 01/17/2015 1305   GLUCOSEU NEGATIVE 01/17/2015 1305   HGBUR NEGATIVE 01/17/2015 1305   BILIRUBINUR NEGATIVE 01/17/2015 1305   BILIRUBINUR n 02/28/2011 1247   KETONESUR NEGATIVE 01/17/2015 1305   PROTEINUR NEGATIVE 01/17/2015 1305   UROBILINOGEN 1.0 01/17/2015  1305   NITRITE NEGATIVE 01/17/2015 1305   LEUKOCYTESUR NEGATIVE 01/17/2015 1305   Sepsis Labs Invalid input(s): PROCALCITONIN,  WBC,  LACTICIDVEN Microbiology Recent Results (from the past 240 hour(s))  Blood culture (routine x 2)     Status: None (Preliminary result)   Collection Time: 10/16/17  2:05 PM  Result Value Ref Range Status   Specimen Description BLOOD LEFT HAND  Final   Special Requests IN PEDIATRIC BOTTLE Blood Culture adequate volume  Final   Culture   Final    NO GROWTH < 24 HOURS Performed at [facility], [place] [address] [place], [place] [address]    Report Status PENDING  Incomplete  Blood culture (routine x 2)     Status: None (Preliminary result)   Collection Time: 10/16/17  2:10 PM  Result Value Ref Range Status   Specimen Description BLOOD LEFT FOREARM  Final   Special Requests   Final    BOTTLES DRAWN AEROBIC AND ANAEROBIC Blood Culture adequate volume   Culture   Final    NO GROWTH < 24 HOURS Performed at [facility], [place] [address], [place], [place] [address]    Report Status PENDING  Incomplete  MRSA PCR Screening     Status: None   Collection Time: 10/16/17  5:45 PM  Result Value Ref Range Status   MRSA by PCR NEGATIVE NEGATIVE Final    Comment:        The GeneXpert MRSA Assay (FDA approved for NASAL specimens only), is one component of a comprehensive MRSA colonization surveillance program. It is not intended to diagnose MRSA infection nor to guide or monitor treatment for MRSA infections. Performed at [facility], [place] [address], [place], [place] [address]      SIGNED:   Cordelia Poche, MD [facility] 10/18/2017, 10:56 AM Pager [phone]  If 7PM-7AM, please contact night-coverage [email or web address] Password [email or web address]

## 2017-10-19 LAB — LEGIONELLA PNEUMOPHILA SEROGP 1 UR AG: L. PNEUMOPHILA SEROGP 1 UR AG: NEGATIVE

## 2017-10-21 LAB — CULTURE, BLOOD (ROUTINE X 2)
CULTURE: NO GROWTH
Culture: NO GROWTH
SPECIAL REQUESTS: ADEQUATE
Special Requests: ADEQUATE

## 2017-10-22 ENCOUNTER — Encounter: Payer: Self-pay | Admitting: Family Medicine

## 2017-10-22 ENCOUNTER — Ambulatory Visit (INDEPENDENT_AMBULATORY_CARE_PROVIDER_SITE_OTHER): Payer: Medicare HMO | Admitting: Family Medicine

## 2017-10-22 ENCOUNTER — Telehealth: Payer: Self-pay | Admitting: Family Medicine

## 2017-10-22 ENCOUNTER — Telehealth: Payer: Self-pay

## 2017-10-22 VITALS — BP 106/68 | HR 86 | Temp 98.4°F | Ht 69.0 in | Wt 197.0 lb

## 2017-10-22 DIAGNOSIS — E1165 Type 2 diabetes mellitus with hyperglycemia: Secondary | ICD-10-CM | POA: Diagnosis not present

## 2017-10-22 DIAGNOSIS — I890 Lymphedema, not elsewhere classified: Secondary | ICD-10-CM

## 2017-10-22 DIAGNOSIS — E1159 Type 2 diabetes mellitus with other circulatory complications: Secondary | ICD-10-CM

## 2017-10-22 DIAGNOSIS — Z794 Long term (current) use of insulin: Secondary | ICD-10-CM

## 2017-10-22 DIAGNOSIS — I10 Essential (primary) hypertension: Secondary | ICD-10-CM

## 2017-10-22 DIAGNOSIS — J189 Pneumonia, unspecified organism: Secondary | ICD-10-CM | POA: Diagnosis not present

## 2017-10-22 DIAGNOSIS — I152 Hypertension secondary to endocrine disorders: Secondary | ICD-10-CM

## 2017-10-22 LAB — BAYER DCA HB A1C WAIVED: HB A1C: 5.9 % (ref <7.0)

## 2017-10-22 MED ORDER — BLOOD GLUCOSE TEST VI STRP: 1.0000 | ORAL_STRIP | Freq: Two times a day (BID) | 11 refills | Status: DC

## 2017-10-22 MED ORDER — HYDROCHLOROTHIAZIDE 25 MG PO TABS: 12.5000 mg | ORAL_TABLET | Freq: Two times a day (BID) | ORAL | 1 refills | Status: DC

## 2017-10-22 MED ORDER — INSULIN ASPART 100 UNIT/ML FLEXPEN: PEN_INJECTOR | SUBCUTANEOUS | 0 refills | Status: DC

## 2017-10-22 MED ORDER — PANTOPRAZOLE SODIUM 40 MG PO TBEC: 40.0000 mg | DELAYED_RELEASE_TABLET | Freq: Every day | ORAL | 3 refills | Status: DC

## 2017-10-22 MED ORDER — GABAPENTIN 400 MG PO CAPS: 1200.0000 mg | ORAL_CAPSULE | Freq: Three times a day (TID) | ORAL | 0 refills | Status: DC

## 2017-10-22 MED ORDER — DOXYCYCLINE HYCLATE 100 MG PO TABS: 100.0000 mg | ORAL_TABLET | Freq: Two times a day (BID) | ORAL | 0 refills | Status: DC

## 2017-10-22 MED ORDER — INSULIN GLARGINE 100 UNIT/ML SOLOSTAR PEN: 37.0000 [IU] | PEN_INJECTOR | Freq: Every day | SUBCUTANEOUS | 1 refills | Status: DC

## 2017-10-22 MED ORDER — PREDNISONE 20 MG PO TABS: ORAL_TABLET | ORAL | 0 refills | Status: DC

## 2017-10-22 NOTE — Telephone Encounter (Signed)
[facility] has a problem with some of the prescriptions sent over for this patient.  Please take a look at his Novolog prescription, it needs to be resent.  His insurance will no longer pay for the One Touch Ultra test strips, so they need you to send in a new prescription for the monitor and supplies for the Accu-Chek Aviva Plus (they will pay for this).  They also wanted to let you know he tests his blood sugar at least 4 times a day because his insulin can be taken up to QID and he must determine BS before injecting.  Instructions for this need to be changed from BID to QID.  The patient will need a 90 day supply of these prescriptions.

## 2017-10-22 NOTE — Telephone Encounter (Signed)
[facility] with D[facility] at [facility].  Informed her that the test strips should be One Touch Ultra.

## 2017-10-22 NOTE — Progress Notes (Signed)
BP 106/68   Pulse 86   Temp 98.4 F (36.9 C) (Oral)   Ht '5\' 9"'$  (1.753 m)   Wt 197 lb (89.4 kg)   SpO2 90%   BMI 29.09 kg/m    Subjective:    Patient ID: [name], male    DOB: 04-15-1961, 57 y.o.   MRN: [id number]  HPI: [name] is a 57 y.o. male presenting on 10/22/2017 for Diabetes (follow up); Hypertension; and Hospital discharge (10/16/17 for flu and pneumonia; was prescribed cefdinir to take after coming home, did not fill because of cost, would like to be started on doxycycline)   HPI Hospital follow-up/discharge Patient is being discharged from the hospital after being diagnosed with pneumonia in the hospital was treated with some IV antibiotics and sent home with Ceftin which he could not afford and is coming in today to see if he can get something else.  He says that his breathing has improved after the hospitalization but he still having cough and congestion.  He denies any fevers or chills and still has a mild shortness of breath mainly on exertion but otherwise has been doing much better.  Swelling of legs Patient has bilateral swelling of legs that has been something he has been fighting for quite some time.  He says that he cannot wear the compression stockings because they hurt too much.  He does not currently have any weeping or redness which is has significant swelling of both lower extremities.  This has not worsened recently but is starting to cause him some nerve ending pain through both legs that feels.  Type 2 diabetes mellitus Patient comes in today for recheck of his diabetes. Patient has been currently taking Lantus and NovoLog and gabapentin. Patient is not currently on an ACE inhibitor/ARB. Patient has not seen an ophthalmologist this year. Patient complains of swelling and burning sensation in both of his lower extremities below the knees down to his feet.  He denies any redness or warmth but the swelling and stretching of his legs has caused pain  down in his legs as well.   Hypertension Patient is currently on hydrochlorothiazide, and their blood pressure today is 106/68. Patient denies any lightheadedness or dizziness. Patient denies headaches, blurred vision, chest pains, shortness of breath, or weakness. Denies any side effects from medication and is content with current medication.   Relevant past medical, surgical, family and social history reviewed and updated as indicated. Interim medical history since our last visit reviewed. Allergies and medications reviewed and updated.  Review of Systems  Constitutional: Negative for chills and fever.  HENT: Positive for congestion, postnasal drip, rhinorrhea, sinus pressure, sneezing and sore throat. Negative for ear discharge, ear pain and voice change.   Eyes: Negative for pain, discharge, redness and visual disturbance.  Respiratory: Positive for cough, shortness of breath and wheezing.   Cardiovascular: Positive for leg swelling. Negative for chest pain and palpitations.  Musculoskeletal: Negative for gait problem.  Skin: Negative for rash.  Psychiatric/Behavioral: Negative for dysphoric mood, self-injury, sleep disturbance and suicidal ideas. The patient is not nervous/anxious.   All other systems reviewed and are negative.   Per HPI unless specifically indicated above   Allergies as of 10/22/2017      Reactions   Temsirolimus Swelling   Pt does not recognize this medication   Ketorolac Tromethamine Hives      Medication List        Accurate as of 10/22/17  3:24 PM.  Always use your most recent med list.          albuterol 108 (90 Base) MCG/ACT inhaler Commonly known as:  PROVENTIL HFA;VENTOLIN HFA Inhale 2 puffs into the lungs every 6 (six) hours as needed for wheezing or shortness of breath.   azithromycin 250 MG tablet Commonly known as:  ZITHROMAX Take 1 tablet (250 mg total) by mouth daily for 4 days.   BLOOD GLUCOSE TEST STRIPS Strp 1 strip by In Vitro route  2 (two) times daily.   carboxymethylcellulose 0.5 % Soln Commonly known as:  REFRESH PLUS Place 1 drop into both eyes 3 (three) times daily as needed (dry eyes).   doxycycline 100 MG tablet Commonly known as:  VIBRA-TABS Take 1 tablet (100 mg total) by mouth 2 (two) times daily. 1 po bid   gabapentin 400 MG capsule Commonly known as:  NEURONTIN Take 3 capsules (1,200 mg total) by mouth 3 (three) times daily.   guaiFENesin 600 MG 12 hr tablet Commonly known as:  MUCINEX Take 2 tablets (1,200 mg total) by mouth 2 (two) times daily.   hydrochlorothiazide 25 MG tablet Commonly known as:  HYDRODIURIL Take 0.5 tablets (12.5 mg total) by mouth 2 (two) times daily.   ibuprofen 200 MG tablet Commonly known as:  ADVIL,MOTRIN Take 600 mg by mouth every 6 (six) hours as needed (back pain).   insulin aspart 100 UNIT/ML FlexPen Commonly known as:  NOVOLOG FLEXPEN INJECT 7 TO 10 UNITS&nbsp;&nbsp;SUBCUTANEOUSLY UP TO 4&nbsp;&nbsp;TIMES DAILY; MAXIMUM DAILY&nbsp;&nbsp;DOSE = 40 UNITS   Insulin Glargine 100 UNIT/ML Solostar Pen Commonly known as:  LANTUS SOLOSTAR Inject 37 Units into the skin daily.   Magnesium 500 MG Tabs Take 500 mg by mouth daily.   methocarbamol 500 MG tablet Commonly known as:  ROBAXIN Take 500 mg by mouth 2 (two) times daily as needed for muscle spasms.   morphine 15 MG tablet Commonly known as:  MSIR Take 15 mg by mouth 3 (three) times daily.   pantoprazole 40 MG tablet Commonly known as:  PROTONIX Take 1 tablet (40 mg total) by mouth daily.   polyethylene glycol packet Commonly known as:  MIRALAX / GLYCOLAX Take 17 g by mouth 2 (two) times daily.   POTASSIUM PO Take 1 tablet by mouth at bedtime as needed (leg cramps).   predniSONE 20 MG tablet Commonly known as:  DELTASONE 2 po at same time daily for 5 days          Objective:    BP 106/68   Pulse 86   Temp 98.4 F (36.9 C) (Oral)   Ht '5\' 9"'$  (1.753 m)   Wt 197 lb (89.4 kg)   SpO2 90%    BMI 29.09 kg/m   Wt Readings from Last 3 Encounters:  10/22/17 197 lb (89.4 kg)  10/16/17 197 lb (89.4 kg)  10/16/17 197 lb (89.4 kg)    Physical Exam  Constitutional: He is oriented to person, place, and time. He appears well-developed and well-nourished. No distress.  HENT:  Right Ear: Tympanic membrane, external ear and ear canal normal.  Left Ear: Tympanic membrane, external ear and ear canal normal.  Nose: Mucosal edema and rhinorrhea present. No sinus tenderness. No epistaxis. Right sinus exhibits no maxillary sinus tenderness and no frontal sinus tenderness. Left sinus exhibits no maxillary sinus tenderness and no frontal sinus tenderness.  Mouth/Throat: Uvula is midline and mucous membranes are normal. Posterior oropharyngeal edema and posterior oropharyngeal erythema present. No oropharyngeal exudate or tonsillar abscesses.  Eyes: Conjunctivae are normal. No scleral icterus.  Neck: Neck supple. No thyromegaly present.  Cardiovascular: Normal rate, regular rhythm, normal heart sounds and intact distal pulses.  No murmur heard. Pulmonary/Chest: Effort normal and breath sounds normal. No respiratory distress. He has no decreased breath sounds. He has no wheezes. He has no rhonchi. He has no rales.  Musculoskeletal: Normal range of motion. He exhibits edema (3+) and tenderness (tenderness with edema in both lower extremities).  Lymphadenopathy:    He has no cervical adenopathy.  Neurological: He is alert and oriented to person, place, and time. Coordination normal.  Skin: Skin is warm and dry. No rash noted. He is not diaphoretic.  Psychiatric: He has a normal mood and affect. His behavior is normal.  Nursing note and vitals reviewed.     Assessment & Plan:   Problem List Items Addressed This Visit      Cardiovascular and Mediastinum   Hypertension associated with diabetes ([place])   Relevant Medications   Insulin Glargine (LANTUS SOLOSTAR) 100 UNIT/ML Solostar Pen    hydrochlorothiazide (HYDRODIURIL) 25 MG tablet   Other Relevant Orders   CMP14+EGFR (Completed)   Lipid panel (Completed)   Microalbumin / creatinine urine ratio (Completed)     Endocrine   Type 2 diabetes mellitus ([facility]) (Chronic)   Relevant Medications   Insulin Glargine (LANTUS SOLOSTAR) 100 UNIT/ML Solostar Pen   gabapentin (NEURONTIN) 400 MG capsule   Other Relevant Orders   Bayer DCA Hb A1c Waived (Completed)   CMP14+EGFR (Completed)   Lipid panel (Completed)   Microalbumin / creatinine urine ratio (Completed)    Other Visit Diagnoses    Pneumonia due to infectious organism, unspecified laterality, unspecified part of lung    -  Primary   Hospital follow-up for pneumonia, sounds like he still having issues, will give prednisone and watch sugars and Doxy   Relevant Medications   doxycycline (VIBRA-TABS) 100 MG tablet   predniSONE (DELTASONE) 20 MG tablet   Other Relevant Orders   CBC with Differential/Platelet (Completed)   Lymphedema       Will wrap with Ace bandages and send him to the lymphedema clinic   Relevant Orders   Ambulatory referral to Physical Therapy     Follow up plan: Return in about 3 months (around 01/19/2018), or if symptoms worsen or fail to improve, for dm, htn.  Counseling provided for all of the vaccine components Orders Placed This Encounter  Procedures  . Bayer DCA Hb A1c Waived  . CMP14+EGFR  . Lipid panel  . CBC with Differential/Platelet  . Microalbumin / creatinine urine ratio  . Ambulatory referral to Physical Therapy    Caryl Pina, MD [facility] 10/22/2017, 3:24 PM

## 2017-10-22 NOTE — Telephone Encounter (Deleted)
[facility] has a problem with some of the prescriptions sent for this patient.  Take a look at his Novolog prescription, that needs to be resent.  The test strips, his insurance company will not pay for the

## 2017-10-23 ENCOUNTER — Other Ambulatory Visit: Payer: Self-pay

## 2017-10-23 DIAGNOSIS — R7989 Other specified abnormal findings of blood chemistry: Secondary | ICD-10-CM

## 2017-10-23 DIAGNOSIS — R945 Abnormal results of liver function studies: Secondary | ICD-10-CM

## 2017-10-23 LAB — CBC WITH DIFFERENTIAL/PLATELET
BASOS ABS: 0.1 10*3/uL (ref 0.0–0.2)
Basos: 1 %
EOS (ABSOLUTE): 0 10*3/uL (ref 0.0–0.4)
Eos: 0 %
Hematocrit: 40.3 % (ref 37.5–51.0)
Hemoglobin: 13.1 g/dL (ref 13.0–17.7)
IMMATURE GRANS (ABS): 0.5 10*3/uL — AB (ref 0.0–0.1)
Immature Granulocytes: 5 %
LYMPHS: 16 %
Lymphocytes Absolute: 1.4 10*3/uL (ref 0.7–3.1)
MCH: 35.6 pg — AB (ref 26.6–33.0)
MCHC: 32.5 g/dL (ref 31.5–35.7)
MCV: 110 fL — ABNORMAL HIGH (ref 79–97)
MONOCYTES: 9 %
Monocytes Absolute: 0.8 10*3/uL (ref 0.1–0.9)
NEUTROS ABS: 5.9 10*3/uL (ref 1.4–7.0)
NEUTROS PCT: 69 %
Platelets: 107 10*3/uL — ABNORMAL LOW (ref 150–379)
RBC: 3.68 x10E6/uL — AB (ref 4.14–5.80)
RDW: 15.1 % (ref 12.3–15.4)
WBC: 8.6 10*3/uL (ref 3.4–10.8)

## 2017-10-23 LAB — MICROALBUMIN / CREATININE URINE RATIO
CREATININE, UR: 36.8 mg/dL
Microalb/Creat Ratio: <8.2 mg/g creat (ref 0.0–30.0)

## 2017-10-23 LAB — CMP14+EGFR
A/G RATIO: 0.8 — AB (ref 1.2–2.2)
ALBUMIN: 2.6 g/dL — AB (ref 3.5–5.5)
ALT: 110 IU/L — ABNORMAL HIGH (ref 0–44)
AST: 231 IU/L — ABNORMAL HIGH (ref 0–40)
Alkaline Phosphatase: 371 IU/L — ABNORMAL HIGH (ref 39–117)
BUN / CREAT RATIO: 9 (ref 9–20)
BUN: 5 mg/dL — ABNORMAL LOW (ref 6–24)
Bilirubin Total: 4.4 mg/dL — ABNORMAL HIGH (ref 0.0–1.2)
CALCIUM: 7.8 mg/dL — AB (ref 8.7–10.2)
CO2: 27 mmol/L (ref 20–29)
CREATININE: 0.54 mg/dL — AB (ref 0.76–1.27)
Chloride: 94 mmol/L — ABNORMAL LOW (ref 96–106)
GFR, EST AFRICAN AMERICAN: 137 mL/min/{1.73_m2} (ref >59)
GFR, EST NON AFRICAN AMERICAN: 118 mL/min/{1.73_m2} (ref >59)
GLOBULIN, TOTAL: 3.4 g/dL (ref 1.5–4.5)
Glucose: 245 mg/dL — ABNORMAL HIGH (ref 65–99)
POTASSIUM: 3.6 mmol/L (ref 3.5–5.2)
SODIUM: 137 mmol/L (ref 134–144)
Total Protein: 6 g/dL (ref 6.0–8.5)

## 2017-10-23 LAB — LIPID PANEL
CHOL/HDL RATIO: 13.2 ratio — AB (ref 0.0–5.0)
Cholesterol, Total: 185 mg/dL (ref 100–199)
HDL: 14 mg/dL — ABNORMAL LOW (ref >39)
LDL CALC: 144 mg/dL — AB (ref 0–99)
Triglycerides: 135 mg/dL (ref 0–149)
VLDL Cholesterol Cal: 27 mg/dL (ref 5–40)

## 2017-10-23 MED ORDER — INSULIN ASPART 100 UNIT/ML FLEXPEN: PEN_INJECTOR | SUBCUTANEOUS | 0 refills | Status: DC

## 2017-10-23 MED ORDER — BLOOD GLUCOSE TEST VI STRP: 1.0000 | ORAL_STRIP | Freq: Four times a day (QID) | 11 refills | Status: DC

## 2017-10-23 MED ORDER — ACCU-CHEK AVIVA PLUS W/DEVICE KIT: 1.0000 | PACK | Freq: Every day | 0 refills | Status: DC

## 2017-10-23 NOTE — Telephone Encounter (Signed)
I sent a new meter and fixed test strips and sent NovoLog.

## 2017-11-07 ENCOUNTER — Inpatient Hospital Stay (HOSPITAL_COMMUNITY)
Admission: EM | Admit: 2017-11-07 | Discharge: 2017-12-13 | DRG: 981 | Disposition: A | Discharge status: Home / Self Care | Payer: Medicare HMO | Attending: Internal Medicine | Admitting: Internal Medicine

## 2017-11-07 ENCOUNTER — Emergency Department (HOSPITAL_COMMUNITY): Payer: Medicare HMO

## 2017-11-07 ENCOUNTER — Encounter (HOSPITAL_COMMUNITY): Payer: Self-pay

## 2017-11-07 DIAGNOSIS — Z0189 Encounter for other specified special examinations: Secondary | ICD-10-CM

## 2017-11-07 DIAGNOSIS — I83009 Varicose veins of unspecified lower extremity with ulcer of unspecified site: Secondary | ICD-10-CM | POA: Diagnosis present

## 2017-11-07 DIAGNOSIS — J9601 Acute respiratory failure with hypoxia: Secondary | ICD-10-CM

## 2017-11-07 DIAGNOSIS — K7460 Unspecified cirrhosis of liver: Secondary | ICD-10-CM

## 2017-11-07 DIAGNOSIS — Z9111 Patient's noncompliance with dietary regimen: Secondary | ICD-10-CM

## 2017-11-07 DIAGNOSIS — F4310 Post-traumatic stress disorder, unspecified: Secondary | ICD-10-CM | POA: Diagnosis present

## 2017-11-07 DIAGNOSIS — G9340 Encephalopathy, unspecified: Secondary | ICD-10-CM | POA: Diagnosis not present

## 2017-11-07 DIAGNOSIS — E11649 Type 2 diabetes mellitus with hypoglycemia without coma: Secondary | ICD-10-CM | POA: Diagnosis present

## 2017-11-07 DIAGNOSIS — F1010 Alcohol abuse, uncomplicated: Secondary | ICD-10-CM | POA: Diagnosis not present

## 2017-11-07 DIAGNOSIS — Z978 Presence of other specified devices: Secondary | ICD-10-CM

## 2017-11-07 DIAGNOSIS — J690 Pneumonitis due to inhalation of food and vomit: Secondary | ICD-10-CM | POA: Diagnosis not present

## 2017-11-07 DIAGNOSIS — Z452 Encounter for adjustment and management of vascular access device: Secondary | ICD-10-CM

## 2017-11-07 DIAGNOSIS — Z4682 Encounter for fitting and adjustment of non-vascular catheter: Secondary | ICD-10-CM | POA: Diagnosis not present

## 2017-11-07 DIAGNOSIS — L97909 Non-pressure chronic ulcer of unspecified part of unspecified lower leg with unspecified severity: Secondary | ICD-10-CM | POA: Diagnosis present

## 2017-11-07 DIAGNOSIS — I120 Hypertensive chronic kidney disease with stage 5 chronic kidney disease or end stage renal disease: Secondary | ICD-10-CM | POA: Diagnosis not present

## 2017-11-07 DIAGNOSIS — D684 Acquired coagulation factor deficiency: Secondary | ICD-10-CM | POA: Diagnosis present

## 2017-11-07 DIAGNOSIS — K7290 Hepatic failure, unspecified without coma: Secondary | ICD-10-CM

## 2017-11-07 DIAGNOSIS — J9600 Acute respiratory failure, unspecified whether with hypoxia or hypercapnia: Secondary | ICD-10-CM | POA: Diagnosis not present

## 2017-11-07 DIAGNOSIS — K7011 Alcoholic hepatitis with ascites: Secondary | ICD-10-CM | POA: Diagnosis present

## 2017-11-07 DIAGNOSIS — I878 Other specified disorders of veins: Secondary | ICD-10-CM | POA: Diagnosis present

## 2017-11-07 DIAGNOSIS — E1142 Type 2 diabetes mellitus with diabetic polyneuropathy: Secondary | ICD-10-CM | POA: Diagnosis not present

## 2017-11-07 DIAGNOSIS — B379 Candidiasis, unspecified: Secondary | ICD-10-CM | POA: Diagnosis not present

## 2017-11-07 DIAGNOSIS — E876 Hypokalemia: Secondary | ICD-10-CM | POA: Diagnosis present

## 2017-11-07 DIAGNOSIS — D631 Anemia in chronic kidney disease: Secondary | ICD-10-CM | POA: Diagnosis present

## 2017-11-07 DIAGNOSIS — Z4901 Encounter for fitting and adjustment of extracorporeal dialysis catheter: Secondary | ICD-10-CM | POA: Diagnosis not present

## 2017-11-07 DIAGNOSIS — N179 Acute kidney failure, unspecified: Secondary | ICD-10-CM | POA: Diagnosis not present

## 2017-11-07 DIAGNOSIS — G20 Parkinson's disease: Secondary | ICD-10-CM | POA: Diagnosis present

## 2017-11-07 DIAGNOSIS — R140 Abdominal distension (gaseous): Secondary | ICD-10-CM | POA: Diagnosis not present

## 2017-11-07 DIAGNOSIS — D6959 Other secondary thrombocytopenia: Secondary | ICD-10-CM | POA: Diagnosis present

## 2017-11-07 DIAGNOSIS — G894 Chronic pain syndrome: Secondary | ICD-10-CM | POA: Diagnosis present

## 2017-11-07 DIAGNOSIS — K219 Gastro-esophageal reflux disease without esophagitis: Secondary | ICD-10-CM | POA: Diagnosis present

## 2017-11-07 DIAGNOSIS — J811 Chronic pulmonary edema: Secondary | ICD-10-CM

## 2017-11-07 DIAGNOSIS — R0902 Hypoxemia: Secondary | ICD-10-CM | POA: Diagnosis not present

## 2017-11-07 DIAGNOSIS — R52 Pain, unspecified: Secondary | ICD-10-CM

## 2017-11-07 DIAGNOSIS — K7291 Hepatic failure, unspecified with coma: Secondary | ICD-10-CM | POA: Diagnosis not present

## 2017-11-07 DIAGNOSIS — N170 Acute kidney failure with tubular necrosis: Secondary | ICD-10-CM | POA: Diagnosis not present

## 2017-11-07 DIAGNOSIS — Z794 Long term (current) use of insulin: Secondary | ICD-10-CM

## 2017-11-07 DIAGNOSIS — I2510 Atherosclerotic heart disease of native coronary artery without angina pectoris: Secondary | ICD-10-CM | POA: Diagnosis present

## 2017-11-07 DIAGNOSIS — E1165 Type 2 diabetes mellitus with hyperglycemia: Secondary | ICD-10-CM | POA: Diagnosis not present

## 2017-11-07 DIAGNOSIS — J9690 Respiratory failure, unspecified, unspecified whether with hypoxia or hypercapnia: Secondary | ICD-10-CM

## 2017-11-07 DIAGNOSIS — M810 Age-related osteoporosis without current pathological fracture: Secondary | ICD-10-CM | POA: Diagnosis present

## 2017-11-07 DIAGNOSIS — R451 Restlessness and agitation: Secondary | ICD-10-CM | POA: Diagnosis not present

## 2017-11-07 DIAGNOSIS — M545 Low back pain: Secondary | ICD-10-CM | POA: Diagnosis not present

## 2017-11-07 DIAGNOSIS — N390 Urinary tract infection, site not specified: Secondary | ICD-10-CM | POA: Diagnosis present

## 2017-11-07 DIAGNOSIS — I82613 Acute embolism and thrombosis of superficial veins of upper extremity, bilateral: Secondary | ICD-10-CM | POA: Diagnosis present

## 2017-11-07 DIAGNOSIS — G9341 Metabolic encephalopathy: Secondary | ICD-10-CM | POA: Diagnosis present

## 2017-11-07 DIAGNOSIS — K767 Hepatorenal syndrome: Secondary | ICD-10-CM | POA: Diagnosis not present

## 2017-11-07 DIAGNOSIS — K766 Portal hypertension: Secondary | ICD-10-CM | POA: Diagnosis present

## 2017-11-07 DIAGNOSIS — E1122 Type 2 diabetes mellitus with diabetic chronic kidney disease: Secondary | ICD-10-CM | POA: Diagnosis not present

## 2017-11-07 DIAGNOSIS — Z87891 Personal history of nicotine dependence: Secondary | ICD-10-CM

## 2017-11-07 DIAGNOSIS — Z992 Dependence on renal dialysis: Secondary | ICD-10-CM

## 2017-11-07 DIAGNOSIS — E559 Vitamin D deficiency, unspecified: Secondary | ICD-10-CM | POA: Diagnosis not present

## 2017-11-07 DIAGNOSIS — Z515 Encounter for palliative care: Secondary | ICD-10-CM

## 2017-11-07 DIAGNOSIS — K7200 Acute and subacute hepatic failure without coma: Secondary | ICD-10-CM | POA: Diagnosis not present

## 2017-11-07 DIAGNOSIS — Z7189 Other specified counseling: Secondary | ICD-10-CM | POA: Diagnosis not present

## 2017-11-07 DIAGNOSIS — D72828 Other elevated white blood cell count: Secondary | ICD-10-CM | POA: Diagnosis not present

## 2017-11-07 DIAGNOSIS — K7031 Alcoholic cirrhosis of liver with ascites: Secondary | ICD-10-CM | POA: Diagnosis not present

## 2017-11-07 DIAGNOSIS — G8929 Other chronic pain: Secondary | ICD-10-CM | POA: Diagnosis not present

## 2017-11-07 DIAGNOSIS — Z9119 Patient's noncompliance with other medical treatment and regimen: Secondary | ICD-10-CM | POA: Diagnosis not present

## 2017-11-07 DIAGNOSIS — Z79899 Other long term (current) drug therapy: Secondary | ICD-10-CM

## 2017-11-07 DIAGNOSIS — N186 End stage renal disease: Secondary | ICD-10-CM | POA: Diagnosis not present

## 2017-11-07 DIAGNOSIS — D696 Thrombocytopenia, unspecified: Secondary | ICD-10-CM

## 2017-11-07 DIAGNOSIS — K7030 Alcoholic cirrhosis of liver without ascites: Secondary | ICD-10-CM | POA: Diagnosis not present

## 2017-11-07 DIAGNOSIS — I700 Atherosclerosis of aorta: Secondary | ICD-10-CM | POA: Diagnosis not present

## 2017-11-07 DIAGNOSIS — D539 Nutritional anemia, unspecified: Secondary | ICD-10-CM | POA: Diagnosis present

## 2017-11-07 DIAGNOSIS — I10 Essential (primary) hypertension: Secondary | ICD-10-CM | POA: Diagnosis not present

## 2017-11-07 DIAGNOSIS — Z66 Do not resuscitate: Secondary | ICD-10-CM | POA: Diagnosis not present

## 2017-11-07 DIAGNOSIS — E119 Type 2 diabetes mellitus without complications: Secondary | ICD-10-CM | POA: Diagnosis not present

## 2017-11-07 DIAGNOSIS — R188 Other ascites: Secondary | ICD-10-CM | POA: Diagnosis not present

## 2017-11-07 DIAGNOSIS — Z79891 Long term (current) use of opiate analgesic: Secondary | ICD-10-CM

## 2017-11-07 DIAGNOSIS — K7040 Alcoholic hepatic failure without coma: Secondary | ICD-10-CM | POA: Diagnosis not present

## 2017-11-07 DIAGNOSIS — E162 Hypoglycemia, unspecified: Secondary | ICD-10-CM | POA: Diagnosis not present

## 2017-11-07 DIAGNOSIS — E871 Hypo-osmolality and hyponatremia: Secondary | ICD-10-CM | POA: Diagnosis present

## 2017-11-07 DIAGNOSIS — S32030S Wedge compression fracture of third lumbar vertebra, sequela: Secondary | ICD-10-CM | POA: Diagnosis not present

## 2017-11-07 DIAGNOSIS — E875 Hyperkalemia: Secondary | ICD-10-CM | POA: Diagnosis not present

## 2017-11-07 DIAGNOSIS — E8770 Fluid overload, unspecified: Secondary | ICD-10-CM | POA: Diagnosis present

## 2017-11-07 DIAGNOSIS — K7469 Other cirrhosis of liver: Secondary | ICD-10-CM | POA: Diagnosis not present

## 2017-11-07 DIAGNOSIS — R0989 Other specified symptoms and signs involving the circulatory and respiratory systems: Secondary | ICD-10-CM | POA: Diagnosis not present

## 2017-11-07 DIAGNOSIS — Z781 Physical restraint status: Secondary | ICD-10-CM

## 2017-11-07 DIAGNOSIS — E1129 Type 2 diabetes mellitus with other diabetic kidney complication: Secondary | ICD-10-CM | POA: Diagnosis not present

## 2017-11-07 DIAGNOSIS — Z7951 Long term (current) use of inhaled steroids: Secondary | ICD-10-CM

## 2017-11-07 DIAGNOSIS — D731 Hypersplenism: Secondary | ICD-10-CM | POA: Diagnosis present

## 2017-11-07 DIAGNOSIS — Z9114 Patient's other noncompliance with medication regimen: Secondary | ICD-10-CM

## 2017-11-07 DIAGNOSIS — R197 Diarrhea, unspecified: Secondary | ICD-10-CM | POA: Diagnosis not present

## 2017-11-07 LAB — CBG MONITORING, ED
GLUCOSE-CAPILLARY: 33 mg/dL — AB (ref 65–99)
GLUCOSE-CAPILLARY: 108 mg/dL — AB (ref 65–99)
GLUCOSE-CAPILLARY: 76 mg/dL (ref 65–99)
Glucose-Capillary: 114 mg/dL — ABNORMAL HIGH (ref 65–99)
Glucose-Capillary: 13 mg/dL — CL (ref 65–99)

## 2017-11-07 LAB — I-STAT VENOUS BLOOD GAS, ED
ACID-BASE DEFICIT: 4 mmol/L — AB (ref 0.0–2.0)
Bicarbonate: 22.3 mmol/L (ref 20.0–28.0)
O2 SAT: 81 %
PO2 VEN: 51 mmHg — AB (ref 32.0–45.0)
TCO2: 24 mmol/L (ref 22–32)
pCO2, Ven: 45.8 mmHg (ref 44.0–60.0)
pH, Ven: 7.294 (ref 7.250–7.430)

## 2017-11-07 LAB — COMPREHENSIVE METABOLIC PANEL
ALBUMIN: 1.9 g/dL — AB (ref 3.5–5.0)
ALBUMIN: 1.8 g/dL — AB (ref 3.5–5.0)
ALK PHOS: 344 U/L — AB (ref 38–126)
ALT: 55 U/L (ref 17–63)
ALT: 53 U/L (ref 17–63)
ANION GAP: 14 (ref 5–15)
ANION GAP: 13 (ref 5–15)
AST: 149 U/L — ABNORMAL HIGH (ref 15–41)
AST: 140 U/L — ABNORMAL HIGH (ref 15–41)
Alkaline Phosphatase: 366 U/L — ABNORMAL HIGH (ref 38–126)
BILIRUBIN TOTAL: 8.9 mg/dL — AB (ref 0.3–1.2)
BUN: 9 mg/dL (ref 6–20)
BUN: 7 mg/dL (ref 6–20)
CALCIUM: 6.4 mg/dL — AB (ref 8.9–10.3)
CO2: 20 mmol/L — AB (ref 22–32)
CO2: 22 mmol/L (ref 22–32)
Calcium: 6.5 mg/dL — ABNORMAL LOW (ref 8.9–10.3)
Chloride: 98 mmol/L — ABNORMAL LOW (ref 101–111)
Chloride: 96 mmol/L — ABNORMAL LOW (ref 101–111)
Creatinine, Ser: 0.76 mg/dL (ref 0.61–1.24)
Creatinine, Ser: 0.63 mg/dL (ref 0.61–1.24)
GFR calc non Af Amer: >60 mL/min (ref >60)
GLUCOSE: 55 mg/dL — AB (ref 65–99)
Glucose, Bld: 42 mg/dL — CL (ref 65–99)
POTASSIUM: 3.2 mmol/L — AB (ref 3.5–5.1)
POTASSIUM: 2.8 mmol/L — AB (ref 3.5–5.1)
SODIUM: 132 mmol/L — AB (ref 135–145)
Sodium: 131 mmol/L — ABNORMAL LOW (ref 135–145)
TOTAL PROTEIN: 5.9 g/dL — AB (ref 6.5–8.1)
TOTAL PROTEIN: 5.6 g/dL — AB (ref 6.5–8.1)
Total Bilirubin: 9.5 mg/dL — ABNORMAL HIGH (ref 0.3–1.2)

## 2017-11-07 LAB — PROTIME-INR
INR: 1.9
Prothrombin Time: 21.6 seconds — ABNORMAL HIGH (ref 11.4–15.2)

## 2017-11-07 LAB — I-STAT CG4 LACTIC ACID, ED
LACTIC ACID, VENOUS: 3.76 mmol/L — AB (ref 0.5–1.9)
Lactic Acid, Venous: 2.89 mmol/L (ref 0.5–1.9)

## 2017-11-07 LAB — PHOSPHORUS: PHOSPHORUS: 2.5 mg/dL (ref 2.5–4.6)

## 2017-11-07 LAB — GLUCOSE, CAPILLARY
GLUCOSE-CAPILLARY: 125 mg/dL — AB (ref 65–99)
Glucose-Capillary: 60 mg/dL — ABNORMAL LOW (ref 65–99)
Glucose-Capillary: 64 mg/dL — ABNORMAL LOW (ref 65–99)

## 2017-11-07 LAB — AMMONIA: AMMONIA: 150 umol/L — AB (ref 9–35)

## 2017-11-07 LAB — D-DIMER, QUANTITATIVE (NOT AT ARMC): D DIMER QUANT: 2.7 ug{FEU}/mL — AB (ref 0.00–0.50)

## 2017-11-07 LAB — CBC
HCT: 30.7 % — ABNORMAL LOW (ref 39.0–52.0)
HEMATOCRIT: 31.7 % — AB (ref 39.0–52.0)
HEMOGLOBIN: 10.8 g/dL — AB (ref 13.0–17.0)
HEMOGLOBIN: 10.4 g/dL — AB (ref 13.0–17.0)
MCH: 37.1 pg — AB (ref 26.0–34.0)
MCH: 36.6 pg — AB (ref 26.0–34.0)
MCHC: 34.1 g/dL (ref 30.0–36.0)
MCHC: 33.9 g/dL (ref 30.0–36.0)
MCV: 108.9 fL — ABNORMAL HIGH (ref 78.0–100.0)
MCV: 108.1 fL — ABNORMAL HIGH (ref 78.0–100.0)
PLATELETS: 102 10*3/uL — AB (ref 150–400)
Platelets: 117 10*3/uL — ABNORMAL LOW (ref 150–400)
RBC: 2.91 MIL/uL — AB (ref 4.22–5.81)
RBC: 2.84 MIL/uL — ABNORMAL LOW (ref 4.22–5.81)
RDW: 19 % — ABNORMAL HIGH (ref 11.5–15.5)
RDW: 18.9 % — AB (ref 11.5–15.5)
WBC: 10.6 10*3/uL — ABNORMAL HIGH (ref 4.0–10.5)
WBC: 7.2 10*3/uL (ref 4.0–10.5)

## 2017-11-07 LAB — I-STAT TROPONIN, ED: Troponin i, poc: 0 ng/mL (ref 0.00–0.08)

## 2017-11-07 LAB — CORTISOL: Cortisol, Plasma: 13.7 ug/dL

## 2017-11-07 LAB — TROPONIN I: Troponin I: <0.03 ng/mL (ref <0.03)

## 2017-11-07 LAB — APTT: aPTT: 45 seconds — ABNORMAL HIGH (ref 24–36)

## 2017-11-07 LAB — MAGNESIUM: MAGNESIUM: 1.3 mg/dL — AB (ref 1.7–2.4)

## 2017-11-07 LAB — AMYLASE: Amylase: 45 U/L (ref 28–100)

## 2017-11-07 LAB — LIPASE, BLOOD
LIPASE: 17 U/L (ref 11–51)
Lipase: 16 U/L (ref 11–51)

## 2017-11-07 LAB — PROCALCITONIN: PROCALCITONIN: 0.37 ng/mL

## 2017-11-07 LAB — LACTIC ACID, PLASMA
LACTIC ACID, VENOUS: 2.9 mmol/L — AB (ref 0.5–1.9)
Lactic Acid, Venous: 2.3 mmol/L (ref 0.5–1.9)

## 2017-11-07 MED ORDER — POTASSIUM CHLORIDE 10 MEQ/100ML IV SOLN
10.0000 meq | INTRAVENOUS | Status: AC
  Administered 2017-11-07 – 2017-11-08 (×6): 10 meq via INTRAVENOUS
  Filled 2017-11-07 (×6): qty 100

## 2017-11-07 MED ORDER — LIDOCAINE HCL (PF) 1 % IJ SOLN
INTRAMUSCULAR | Status: AC
  Filled 2017-11-07: qty 5

## 2017-11-07 MED ORDER — POTASSIUM PHOSPHATES 15 MMOLE/5ML IV SOLN
30.0000 mmol | Freq: Once | INTRAVENOUS | Status: AC
  Administered 2017-11-07: 30 mmol via INTRAVENOUS
  Filled 2017-11-07: qty 10

## 2017-11-07 MED ORDER — MORPHINE SULFATE 15 MG PO TABS
15.0000 mg | ORAL_TABLET | Freq: Three times a day (TID) | ORAL | Status: DC | PRN
  Administered 2017-11-07: 15 mg via ORAL
  Filled 2017-11-07: qty 1

## 2017-11-07 MED ORDER — MAGNESIUM SULFATE 4 GM/100ML IV SOLN
4.0000 g | Freq: Once | INTRAVENOUS | Status: AC
  Administered 2017-11-07: 4 g via INTRAVENOUS
  Filled 2017-11-07: qty 100

## 2017-11-07 MED ORDER — LACTULOSE 10 GM/15ML PO SOLN
30.0000 g | Freq: Three times a day (TID) | ORAL | Status: DC
  Filled 2017-11-07 (×2): qty 45

## 2017-11-07 MED ORDER — SODIUM CHLORIDE 0.9 % IV SOLN
2.0000 g | INTRAVENOUS | Status: AC
  Administered 2017-11-08 – 2017-11-13 (×6): 2 g via INTRAVENOUS
  Filled 2017-11-07 (×6): qty 20

## 2017-11-07 MED ORDER — SODIUM CHLORIDE 0.9 % IV SOLN
2.0000 g | Freq: Once | INTRAVENOUS | Status: AC
  Administered 2017-11-07: 2 g via INTRAVENOUS
  Filled 2017-11-07: qty 20

## 2017-11-07 MED ORDER — LACTULOSE 10 GM/15ML PO SOLN
30.0000 g | Freq: Three times a day (TID) | ORAL | Status: DC
  Administered 2017-11-07 – 2017-11-10 (×10): 30 g via ORAL
  Filled 2017-11-07 (×11): qty 45

## 2017-11-07 MED ORDER — DEXTROSE 50 % IV SOLN
50.0000 mL | Freq: Once | INTRAVENOUS | Status: AC
  Administered 2017-11-07: 50 mL via INTRAVENOUS

## 2017-11-07 MED ORDER — DEXTROSE 50 % IV SOLN
INTRAVENOUS | Status: AC
  Administered 2017-11-07: 50 mL via INTRAVENOUS
  Filled 2017-11-07: qty 50

## 2017-11-07 MED ORDER — DEXTROSE 10 % IV SOLN
INTRAVENOUS | Status: DC
Start: 2017-11-07 — End: 2017-11-08
  Administered 2017-11-07 – 2017-11-08 (×2): via INTRAVENOUS

## 2017-11-07 MED ORDER — SODIUM CHLORIDE 0.9 % IV SOLN: INTRAVENOUS | Status: DC

## 2017-11-07 MED ORDER — DEXTROSE 50 % IV SOLN
INTRAVENOUS | Status: AC
  Administered 2017-11-07: 50 mL
  Filled 2017-11-07: qty 50

## 2017-11-07 MED ORDER — SODIUM CHLORIDE 0.9 % IV SOLN
250.0000 mL | INTRAVENOUS | Status: DC | PRN
  Administered 2017-11-17 – 2017-12-10 (×3): 250 mL via INTRAVENOUS

## 2017-11-07 MED ORDER — PANTOPRAZOLE SODIUM 40 MG IV SOLR
40.0000 mg | Freq: Every day | INTRAVENOUS | Status: DC
  Administered 2017-11-07 – 2017-11-09 (×3): 40 mg via INTRAVENOUS
  Filled 2017-11-07 (×3): qty 40

## 2017-11-07 MED ORDER — DEXTROSE 50 % IV SOLN
25.0000 g | Freq: Once | INTRAVENOUS | Status: AC
  Administered 2017-11-07: 25 g via INTRAVENOUS
  Filled 2017-11-07: qty 50

## 2017-11-07 NOTE — Progress Notes (Signed)
CRITICAL VALUE ALERT  Critical Value:  Calcuim 6.4, potassium 2.6, Lactic Acid 2.3  Date & Time Notied:  11/07/17/1940  Provider Notified: Dr. Darrick Pennaeterding  Orders Received/Actions taken: Electrolytes replaced

## 2017-11-07 NOTE — ED Notes (Signed)
ATTEMPTED REPORT.  WILL DO BEDSIDE REPORT.

## 2017-11-07 NOTE — H&P (Signed)
PULMONARY / CRITICAL CARE MEDICINE   Name: [name] MRN: [id number] DOB: [birth year]    ADMISSION DATE:  11/07/2017   REFERRING MD: E DP  CHIEF COMPLAINT: Abdominal swelling and altered mental status  HISTORY OF PRESENT ILLNESS:   [name] is a 57 year old male former [employer] who has chronic back pain compression fractures diabetes mellitus.  He drinks approximately 2 bottles of wine daily.  Family notes his abdomen is been, distended probably over the last year but acutely so over the last 3-4 weeks.  He does have  posttraumatic stress disorder and chronic pain [facility] has been decreasing his opioids.  May be limited to his increased alcohol ingestion.  He presents to the emergency room at [facility] after being recently treated for pneumonia and flu approximately 2-3 weeks ago.  Family noticed that he became jaundice, somnolent, and brought him to the emergency room to be evaluated.  Pulmonary critical care called to evaluate.  Due to his tenuous state he will be admitted to the intensive care unit good possibility he may end up intubated and on mechanical ventilatory support.  PAST MEDICAL HISTORY :  He  has a past medical history of Chronic lower back pain, Compression fracture, Diabetes mellitus, Fracture acetabulum-closed ([place]) (04/09/2013), GERD (gastroesophageal reflux disease), H/O Legionnaire's disease, Hepatomegaly, Migraine, Osteoporosis, Pancreatitis, PTSD (post-traumatic stress disorder), and Vitamin D deficiency.  PAST SURGICAL HISTORY: He  has a past surgical history that includes Cholecystectomy; Hernia repair; Laminectomy; Back surgery; Sternum fracture surgery; Kyphoplasty (N/A, 07/16/2015); and Mass excision (Left, 06/12/2017).  Allergies  Allergen Reactions  . Temsirolimus Other (See Comments)    Unknown - Pt does not recognize this medication [facility]  . Ketorolac Tromethamine Hives    No current facility-administered medications on file  prior to encounter.    Current Outpatient Medications on File Prior to Encounter  Medication Sig  . albuterol (PROVENTIL HFA;VENTOLIN HFA) 108 (90 Base) MCG/ACT inhaler Inhale 2 puffs into the lungs every 6 (six) hours as needed for wheezing or shortness of breath.  . Blood Glucose Monitoring Suppl (ACCU-CHEK AVIVA PLUS) w/Device KIT 1 each by Does not apply route daily.  . carboxymethylcellulose (REFRESH PLUS) 0.5 % SOLN Place 1 drop into both eyes 3 (three) times daily as needed (dry eyes).  Marland Kitchen doxycycline (VIBRA-TABS) 100 MG tablet Take 1 tablet (100 mg total) by mouth 2 (two) times daily. 1 po bid  . gabapentin (NEURONTIN) 400 MG capsule Take 3 capsules (1,200 mg total) by mouth 3 (three) times daily.  . Glucose Blood (BLOOD GLUCOSE TEST STRIPS) STRP 1 strip by In Vitro route 4 (four) times daily.  Marland Kitchen guaiFENesin (MUCINEX) 600 MG 12 hr tablet Take 2 tablets (1,200 mg total) by mouth 2 (two) times daily.  . hydrochlorothiazide (HYDRODIURIL) 25 MG tablet Take 0.5 tablets (12.5 mg total) by mouth 2 (two) times daily.  Marland Kitchen ibuprofen (ADVIL,MOTRIN) 200 MG tablet Take 600 mg by mouth every 6 (six) hours as needed (back pain).  . insulin aspart (NOVOLOG FLEXPEN) 100 UNIT/ML FlexPen INJECT 7 TO 15 UNITS subcu up to 4 times daily (Patient taking differently: INJECT 9 - 17 UNITS SQ UP TO FOUR TIMES A DAY)  . Insulin Glargine (LANTUS SOLOSTAR) 100 UNIT/ML Solostar Pen Inject 37 Units into the skin daily. (Patient taking differently: Inject 33 Units into the skin daily. )  . Magnesium 500 MG TABS Take 500 mg by mouth daily.  . methocarbamol (ROBAXIN) 500 MG tablet Take 500 mg by mouth  2 (two) times daily as needed for muscle spasms.  Marland Kitchen morphine (MSIR) 15 MG tablet Take 15 mg by mouth 3 (three) times daily.   . pantoprazole (PROTONIX) 40 MG tablet Take 1 tablet (40 mg total) by mouth daily.  . polyethylene glycol (MIRALAX / GLYCOLAX) packet Take 17 g by mouth 2 (two) times daily.  Marland Kitchen POTASSIUM PO Take 1 tablet  by mouth at bedtime as needed (leg cramps).  . predniSONE (DELTASONE) 20 MG tablet 2 po at same time daily for 5 days    FAMILY HISTORY:  His indicated that his mother is alive. He indicated that his father is alive. He indicated that his sister is alive. He indicated that his brother is alive. He indicated that the status of his unknown relative is unknown.   SOCIAL HISTORY: He  reports that he quit smoking about 10 years ago. His smoking use included cigarettes. He has a 13.00 pack-year smoking history. he has never used smokeless tobacco. He reports that he drinks about 3.6 oz of alcohol per week. He reports that he does not use drugs.  REVIEW OF SYSTEMS:   None available  SUBJECTIVE:  57 year old male who is encephalopathic with cirrhosis  VITAL SIGNS: BP (!) 84/65   Pulse 79   Temp 97.8 F (36.6 C) (Oral)   Resp 18   SpO2 (!) 89%   HEMODYNAMICS:    VENTILATOR SETTINGS:    INTAKE / OUTPUT: No intake/output data recorded.  PHYSICAL EXAMINATION: General: Jaundiced male barely arousable Neuro: Lethargic but does open his eyes and follows commands moves all extremities x4 HEENT: Scleral jaundice pupils equal reactive to Cardiovascular: Heart sounds are regular regular rate and rhythm Lungs: Coarse rhonchi Abdomen: Distended positive fluid wave Musculoskeletal: Lower extremities have gross volume overload and cellulitis changes Skin: Jaundice warm dry  LABS:  BMET Recent Labs  Lab 11/07/17 1306  NA 132*  K 3.2*  CL 98*  CO2 20*  BUN 9  CREATININE 0.76  GLUCOSE 55*    Electrolytes Recent Labs  Lab 11/07/17 1306  CALCIUM 6.5*    CBC Recent Labs  Lab 11/07/17 1306  WBC 10.6*  HGB 10.8*  HCT 31.7*  PLT 117*    Coag's No results for input(s): APTT, INR in the last 168 hours.  Sepsis Markers Recent Labs  Lab 11/07/17 1436 11/07/17 1622  LATICACIDVEN 3.76* 2.89*    ABG No results for input(s): PHART, PCO2ART, PO2ART in the last 168  hours.  Liver Enzymes Recent Labs  Lab 11/07/17 1306  AST 149*  ALT 55  ALKPHOS 366*  BILITOT 9.5*  ALBUMIN 1.9*    Cardiac Enzymes No results for input(s): TROPONINI, PROBNP in the last 168 hours.  Glucose Recent Labs  Lab 11/07/17 1415 11/07/17 1437 11/07/17 1553 11/07/17 1617  GLUCAP 33* 114* 13* 108*    Imaging Dg Chest Portable 1 View  Result Date: 11/07/2017 CLINICAL DATA:  57 year old male with history of lower extremity swelling bilaterally for the past 3 days. EXAM: PORTABLE CHEST 1 VIEW COMPARISON:  Chest x-ray 10/16/2017. FINDINGS: Lung volumes are normal. No consolidative airspace disease. No pleural effusions. No pneumothorax. No pulmonary nodule or mass noted. Pulmonary vasculature and the cardiomediastinal silhouette are within normal limits. Atherosclerosis in the thoracic aorta. Orthopedic fixation hardware in the sternum incidentally noted. Multiple old healed fractures of the ribs bilaterally. IMPRESSION: 1.  No radiographic evidence of acute cardiopulmonary disease. 2. Aortic atherosclerosis. 3. Additional incidental findings, as above. Electronically Signed   By:  Vinnie Langton M.D.   On: 11/07/2017 15:18     STUDIES:    CULTURES: 11/07/2017 blood cultures x2>> 11/07/2017 urine culture>> 11/07/2017 centesis fluid for culture>>  ANTIBIOTICS:   SIGNIFICANT EVENTS:   LINES/TUBES:   DISCUSSION: [name] is a 57 year old male former [employer] who has chronic back pain compression fractures diabetes mellitus.  He drinks approximately 2 bottles of wine daily.  Family notes his abdomen is been, distended probably over the last year but acutely so over the last 3-4 weeks.  He does have  posttraumatic stress disorder and chronic pain [facility] has been decreasing his opioids.  May be limited to his increased alcohol ingestion.  He presents to the emergency room at [facility] after being recently treated for pneumonia and flu approximately 2-3 weeks  ago.  Family noticed that he became jaundice, somnolent, and brought him to the emergency room to be evaluated.  Pulmonary critical care called to evaluate.  Due to his tenuous state he will be admitted to the intensive care unit good possibility he may end up intubated and on mechanical ventilatory support.  ASSESSMENT / PLAN:  PULMONARY A: No acute issues currently P:   Chest x-ray  CARDIOVASCULAR A:  Currently hemodynamically stable P:  Question will become hemodynamically unstable once he gets tapped Admit to the intensive care unit for close monitoring  RENAL Lab Results  Component Value Date   CREATININE 0.76 11/07/2017   CREATININE 0.54 (L) 10/22/2017   CREATININE 0.65 10/18/2017   Recent Labs  Lab 11/07/17 1306  K 3.2*    A:   Hypokalemia P:   Replete  GASTROINTESTINAL A:   Cirrhosis Liver failure Rule out hepatitis P:   Therapeutic tap Lactulose Further testing.  HEMATOLOGIC Lab Results  Component Value Date   INR 1.58 10/16/2017   INR 1.20 01/20/2015   INR 1.3 ratio (H) 06/13/2010   Recent Labs    11/07/17 1306  HGB 10.8*    A:   Chronic anemia P:  Hold any anticoagulant Liver function tests  INFECTIOUS A:   Possible infection Recent treatment for pneumonia  p:   Pan culture No antibiotics at this time  ENDOCRINE CBG (last 3)  Recent Labs    11/07/17 1437 11/07/17 1553 11/07/17 1617  GLUCAP 114* 13* 108*   A:    Diabetes mellitus Continuation of insulin despite having not eaten for days and be in ill. P:   D50 drip Hold all insulin CBGs every 2 hours until stable  NEUROLOGIC A:   Encephalopathic most likely multifactorial with liver failure coupled with hypoglycemia  p:   RASS goal: 0 Hold all sedation Start lactulose   FAMILY  - Updates: Wife updated at bedside  - Inter-disciplinary family meet or Palliative Care meeting due by:  day 7  NP critical care time 38 min  Richardson Landry Reshard Guillet ACNP Maryanna Shape  PCCM Pager [phone] till 1 pm If no answer page [phone] 11/07/2017, 5:08 PM

## 2017-11-07 NOTE — ED Provider Notes (Signed)
[facility] EMERGENCY DEPARTMENT Provider Note   CSN: [id number] Arrival date & time: 11/07/17  1253     History   Chief Complaint Chief Complaint  Patient presents with  . leg swelling/jaundice    HPI [name] is a 57 y.o. male presenting for evaluation of of leg swelling, diarrhea, and jaundice.  Patient states he has been doing poorly for the past several weeks, however acutely 4 days ago, he started to feel worse.  He reports he has had 4-day history of diarrhea.  Patient reports 2-3-week history of jaundice and worsening edema.  Edema is bilateral legs and abdomen.  He reports pain everywhere, specifically in his lower legs, which she has had weeping from blisters.  He denies fevers, chills, cough, or chest pain.  He denies confusion, although his wife reports he has been more confused recently.  He does not see a GI doctor for his history of alcoholic cirrhosis.  He is still drinking, increased recently.  He has a history of diabetes, blood sugar was low today around 60.   HPI  Past Medical History:  Diagnosis Date  . Chronic lower back pain   . Compression fracture    lumbar 3  . Diabetes mellitus   . Fracture acetabulum-closed ([place]) 04/09/2013  . GERD (gastroesophageal reflux disease)   . H/O Legionnaire's disease   . Hepatomegaly   . Migraine   . Osteoporosis   . Pancreatitis   . PTSD (post-traumatic stress disorder)   . Vitamin D deficiency     Patient Active Problem List   Diagnosis Date Noted  . Liver failure ([place]) 11/07/2017  . Influenza A 10/16/2017  . Closed fracture of second lumbar vertebra ([place])   . Compression fracture of L3 lumbar vertebra with delayed healing 07/16/2015  . Compression fracture of lumbar vertebra ([place]) 07/16/2015  . Pressure ulcer 01/18/2015  . Lumbar foraminal stenosis 04/19/2013  . Acetabulum fracture, right ([place]) 04/09/2013  . Type 2 diabetes mellitus ([place]) 04/09/2013  . CIRRHOSIS, ALCOHOLIC  08/13/7587  . LIVER FUNCTION TESTS, ABNORMAL, HX OF 10/02/2009  . PANCREATITIS, HX OF 09/27/2009  . HYPOGONADISM 08/29/2009  . VITAMIN D DEFICIENCY 08/29/2009  . OSTEOPOROSIS 02/22/2009  . Hypertension associated with diabetes ([place]) 02/08/2009  . GERD 02/08/2009  . BACK PAIN, LUMBAR, CHRONIC 02/08/2009  . LEG EDEMA, LEFT 02/08/2009  . Hepatomegaly 02/08/2009    Past Surgical History:  Procedure Laterality Date  . BACK SURGERY    . CHOLECYSTECTOMY    . HERNIA REPAIR    . KYPHOPLASTY N/A 07/16/2015   Procedure: Lumbar three kyphoplasty;  Surgeon: Ashok Pall, MD;  Location: [facility];  Service: Neurosurgery;  Laterality: N/A;  Lumbar three kyphoplasty  . LAMINECTOMY    . MASS EXCISION Left 06/12/2017   Procedure: EXCISION LEFT AXILLARY SEBACEOUS CYST;  Surgeon: Coralie Keens, MD;  Location: [facility];  Service: General;  Laterality: Left;  . STERNUM FRACTURE SURGERY         Home Medications    Prior to Admission medications   Medication Sig Start Date End Date Taking? Authorizing Provider  albuterol (PROVENTIL HFA;VENTOLIN HFA) 108 (90 Base) MCG/ACT inhaler Inhale 2 puffs into the lungs every 6 (six) hours as needed for wheezing or shortness of breath. 10/16/17   Terald Sleeper, PA-C  Blood Glucose Monitoring Suppl (ACCU-CHEK AVIVA PLUS) w/Device KIT 1 each by Does not apply route daily. 10/23/17   Dettinger, Fransisca Kaufmann, MD  carboxymethylcellulose (REFRESH PLUS) 0.5 %  SOLN Place 1 drop into both eyes 3 (three) times daily as needed (dry eyes).    [provider]  doxycycline (VIBRA-TABS) 100 MG tablet Take 1 tablet (100 mg total) by mouth 2 (two) times daily. 1 po bid 10/22/17   Dettinger, Fransisca Kaufmann, MD  gabapentin (NEURONTIN) 400 MG capsule Take 3 capsules (1,200 mg total) by mouth 3 (three) times daily. 10/22/17   Dettinger, Fransisca Kaufmann, MD  Glucose Blood (BLOOD GLUCOSE TEST STRIPS) STRP 1 strip by In Vitro route 4 (four) times daily. 10/23/17   Dettinger,  Fransisca Kaufmann, MD  guaiFENesin (MUCINEX) 600 MG 12 hr tablet Take 2 tablets (1,200 mg total) by mouth 2 (two) times daily. 10/18/17   Mariel Aloe, MD  hydrochlorothiazide (HYDRODIURIL) 25 MG tablet Take 0.5 tablets (12.5 mg total) by mouth 2 (two) times daily. 10/22/17   Dettinger, Fransisca Kaufmann, MD  ibuprofen (ADVIL,MOTRIN) 200 MG tablet Take 600 mg by mouth every 6 (six) hours as needed (back pain).    [provider]  insulin aspart (NOVOLOG FLEXPEN) 100 UNIT/ML FlexPen INJECT 7 TO 15 UNITS subcu up to 4 times daily Patient taking differently: INJECT 9 - 17 UNITS SQ UP TO FOUR TIMES A DAY 10/23/17   Dettinger, Fransisca Kaufmann, MD  Insulin Glargine (LANTUS SOLOSTAR) 100 UNIT/ML Solostar Pen Inject 37 Units into the skin daily. Patient taking differently: Inject 33 Units into the skin daily.  10/22/17   Dettinger, Fransisca Kaufmann, MD  Magnesium 500 MG TABS Take 500 mg by mouth daily.    [provider]  methocarbamol (ROBAXIN) 500 MG tablet Take 500 mg by mouth 2 (two) times daily as needed for muscle spasms.    [provider]  morphine (MSIR) 15 MG tablet Take 15 mg by mouth 3 (three) times daily.     [provider]  pantoprazole (PROTONIX) 40 MG tablet Take 1 tablet (40 mg total) by mouth daily. 10/22/17   Dettinger, Fransisca Kaufmann, MD  polyethylene glycol (MIRALAX / GLYCOLAX) packet Take 17 g by mouth 2 (two) times daily. 12/01/16   Reyne Dumas, MD  POTASSIUM PO Take 1 tablet by mouth at bedtime as needed (leg cramps).    [provider]  predniSONE (DELTASONE) 20 MG tablet 2 po at same time daily for 5 days 10/22/17   Dettinger, Fransisca Kaufmann, MD    Family History Family History  Problem Relation Age of Onset  . Cancer Unknown        breast/grandmother, prostate/grandfather    Social History Social History   Tobacco Use  . Smoking status: Former Smoker    Packs/day: 0.50    Years: 26.00    Pack years: 13.00    Types: Cigarettes    Last attempt to quit: 08/26/2007     Years since quitting: 10.2  . Smokeless tobacco: Never Used  Substance Use Topics  . Alcohol use: Yes    Alcohol/week: 3.6 oz    Types: 6 Glasses of wine per week    Comment: 6 oz x 6 glass of wine daily   . Drug use: No     Allergies   Temsirolimus and Ketorolac tromethamine   Review of Systems Review of Systems  Gastrointestinal: Positive for abdominal pain and diarrhea.  Musculoskeletal: Positive for myalgias.  Skin: Positive for color change.  All other systems reviewed and are negative.    Physical Exam Updated Vital Signs BP (!) 84/65   Pulse 79   Temp 97.8 F (36.6 C) (  Oral)   Resp 18   SpO2 (!) 89%   Physical Exam  Constitutional: He is oriented to person, place, and time. He appears well-developed.  Patient is jaundiced and clearly fluid overloaded.  Appears chronically ill.  HENT:  Head: Normocephalic and atraumatic.  Eyes: Conjunctivae and EOM are normal. Pupils are equal, round, and reactive to light. Scleral icterus is present.  Neck: Normal range of motion. Neck supple.  Cardiovascular: Normal rate, regular rhythm and intact distal pulses.  Pulmonary/Chest: Effort normal. No respiratory distress. He has wheezes.  Scattered expiratory wheezes.  Speaking full sentences.  Abdominal: Soft. He exhibits no distension. There is tenderness.  Mild generalized TTP of abd  Musculoskeletal: Normal range of motion.  Neurological: He is alert and oriented to person, place, and time.  Asterixis  Skin:  Patient is jaundiced.  Psychiatric: He has a normal mood and affect.  Nursing note and vitals reviewed.    ED Treatments / Results  Labs (all labs ordered are listed, but only abnormal results are displayed) Labs Reviewed  COMPREHENSIVE METABOLIC PANEL - Abnormal; Notable for the following components:      Result Value   Sodium 132 (*)    Potassium 3.2 (*)    Chloride 98 (*)    CO2 20 (*)    Glucose, Bld 55 (*)    Calcium 6.5 (*)    Total Protein 5.9  (*)    Albumin 1.9 (*)    AST 149 (*)    Alkaline Phosphatase 366 (*)    Total Bilirubin 9.5 (*)    All other components within normal limits  CBC - Abnormal; Notable for the following components:   WBC 10.6 (*)    RBC 2.91 (*)    Hemoglobin 10.8 (*)    HCT 31.7 (*)    MCV 108.9 (*)    MCH 37.1 (*)    RDW 19.0 (*)    Platelets 117 (*)    All other components within normal limits  AMMONIA - Abnormal; Notable for the following components:   Ammonia 150 (*)    All other components within normal limits  I-STAT CG4 LACTIC ACID, ED - Abnormal; Notable for the following components:   Lactic Acid, Venous 3.76 (*)    All other components within normal limits  CBG MONITORING, ED - Abnormal; Notable for the following components:   Glucose-Capillary 33 (*)    All other components within normal limits  I-STAT VENOUS BLOOD GAS, ED - Abnormal; Notable for the following components:   pO2, Ven 51.0 (*)    Acid-base deficit 4.0 (*)    All other components within normal limits  CBG MONITORING, ED - Abnormal; Notable for the following components:   Glucose-Capillary 114 (*)    All other components within normal limits  I-STAT CG4 LACTIC ACID, ED - Abnormal; Notable for the following components:   Lactic Acid, Venous 2.89 (*)    All other components within normal limits  CBG MONITORING, ED - Abnormal; Notable for the following components:   Glucose-Capillary 13 (*)    All other components within normal limits  CBG MONITORING, ED - Abnormal; Notable for the following components:   Glucose-Capillary 108 (*)    All other components within normal limits  CULTURE, BLOOD (ROUTINE X 2)  CULTURE, BLOOD (ROUTINE X 2)  URINE CULTURE  LIPASE, BLOOD  URINALYSIS, ROUTINE W REFLEX MICROSCOPIC  COMPREHENSIVE METABOLIC PANEL  MAGNESIUM  PHOSPHORUS  PROCALCITONIN  CORTISOL  CBC  PROTIME-INR  APTT  STREP PNEUMONIAE URINARY ANTIGEN  BLOOD GAS, ARTERIAL  CBC  BASIC METABOLIC PANEL  BLOOD GAS,  ARTERIAL  AMYLASE  LIPASE, BLOOD  TROPONIN I  TROPONIN I  TROPONIN I  LACTIC ACID, PLASMA  LACTIC ACID, PLASMA  D-DIMER, QUANTITATIVE (NOT AT [facility])  URINALYSIS, ROUTINE W REFLEX MICROSCOPIC  HEPATIC FUNCTION PANEL  HEPATITIS PANEL, ACUTE  I-STAT TROPONIN, ED    EKG  EKG Interpretation None       Radiology Dg Chest Portable 1 View  Result Date: 11/07/2017 CLINICAL DATA:  56-year-old male with history of lower extremity swelling bilaterally for the past 3 days. EXAM: PORTABLE CHEST 1 VIEW COMPARISON:  Chest x-ray 10/16/2017. FINDINGS: Lung volumes are normal. No consolidative airspace disease. No pleural effusions. No pneumothorax. No pulmonary nodule or mass noted. Pulmonary vasculature and the cardiomediastinal silhouette are within normal limits. Atherosclerosis in the thoracic aorta. Orthopedic fixation hardware in the sternum incidentally noted. Multiple old healed fractures of the ribs bilaterally. IMPRESSION: 1.  No radiographic evidence of acute cardiopulmonary disease. 2. Aortic atherosclerosis. 3. Additional incidental findings, as above. Electronically Signed   By: Daniel  Entrikin M.D.   On: 11/07/2017 15:18    Procedures .Critical Care Performed by: , , PA-C Authorized by: , , PA-C   Critical care provider statement:    Critical care time (minutes):  50   Critical care time was exclusive of:  Separately billable procedures and treating other patients and teaching time   Critical care was necessary to treat or prevent imminent or life-threatening deterioration of the following conditions:  Hepatic failure and endocrine crisis   Critical care was time spent personally by me on the following activities:  Development of treatment plan with patient or surrogate, discussions with consultants, discussions with primary provider, evaluation of patient's response to treatment, examination of patient, obtaining history from patient or surrogate,  review of old charts, re-evaluation of patient's condition, pulse oximetry, ordering and review of radiographic studies, ordering and review of laboratory studies and ordering and performing treatments and interventions   I assumed direction of critical care for this patient from another provider in my specialty: no   Comments:     Patient acutely ill with liver failure. Pt with 2 hypoglycemic with immediate initiation of D50 pushes.    (including critical care time)  Medications Ordered in ED Medications  dextrose 10 % infusion ( Intravenous New Bag/Given 11/07/17 1600)  pantoprazole (PROTONIX) injection 40 mg (not administered)  0.9 %  sodium chloride infusion (not administered)  lactulose (CHRONULAC) 10 GM/15ML solution 30 g (not administered)  lidocaine (PF) (XYLOCAINE) 1 % injection (not administered)  dextrose 50 % solution 25 g (25 g Intravenous Given 11/07/17 1418)  dextrose 50 % solution 50 mL (50 mLs Intravenous Given 11/07/17 1558)     Initial Impression / Assessment and Plan / ED Course  I have reviewed the triage vital signs and the nursing notes.  Pertinent labs & imaging results that were available during my care of the patient were reviewed by me and considered in my medical decision making (see chart for details).     Patient presenting for 2-week worsening of jaundice, bilateral edema and ascites.  4-day history of worsening diarrhea.  Additionally, patient with bilateral leg swelling, skin weeping, and pain.  Patient has a history of alcoholic cirrhosis, and he has continued to drink.  Physical exam shows patient who looks acutely ill and jaundiced.  He is clearly fluid overloaded.  No obvious confusion, however patient   with asterixis. Hypoxic in the mid 80's on arrival, increased to mid 90's with 6 liters via [place]. Labs show mild leukocytosis, hypokalemia, hyponatremia, and elevated liver enzymes. Will obtain cbg ammonia, lactate, troponin, vbg, and cxr.  Tylenol exam  reassuring, with mild generalized tenderness.  No fevers.  Doubt SBP at this time.  Patient states pain is related to tightness.  Case discussed with attending, Dr. Mackuen evaluated the patient.  cbg low at 33, pt given D50 and BGL increased to 114.  Ammonia elevated at 150.  Lactate elevated at 3.7.  VBG and troponin reassuring.  Chest x-ray without obvious infiltrate or abnormality.  Case discussed with Dr. Edwards from gastroenterology, who will follow the patient in consultation while he is in the hospital.  Patient's blood sugar dropped to 13.  Another amp of D50 started, patient started on D10 drip.  Patient states he took his insulin this morning, but has not had anything to eat.  Patient slightly hypotensive at 85 systolic.  Case discussed with hospitalist, who recommends critical care evaluation due to soft blood pressure and coincidal need for diuresis.  Discussed with Dr. Byrum from critical care.  PCCM team will evaluate the patient.  Patient to be admitted under critical care service.  Final Clinical Impressions(s) / ED Diagnoses   Final diagnoses:  Decompensation of cirrhosis of liver (HCC)  Hypoxia  Hypoglycemia    ED Discharge Orders    None       , , PA-C 11/07/17 1710    Mackuen, Courteney Lyn, MD 11/08/17 1145  

## 2017-11-07 NOTE — Progress Notes (Signed)
Pt's FSBS 64, pt given orange juice and peanut butter crackers. Pt FSBS now 125.

## 2017-11-07 NOTE — ED Notes (Signed)
Pt reports he drinks 6 oz x 6 glasses of wine daily.  Last drink 11-06-17.

## 2017-11-07 NOTE — ED Triage Notes (Addendum)
Patient complains of significant lower extremity swelling bilaterally x 3 days. Last night weeping from same. Patient with diarrhea and yellowish tenting to skin and sclera. Alert and oriented

## 2017-11-07 NOTE — Progress Notes (Signed)
eLink Physician-Brief Progress Note Patient Name: [name] P Dena DOB: 11/25/1960 MRN: [id number]   Date of Service  11/07/2017  HPI/Events of Note  Hypokalemia, hypophos, hypomag, hypocalcemia  eICU Interventions  Potassium, Phos, Magnesium, calcium replaced     Intervention Category Intermediate Interventions: Electrolyte abnormality - evaluation and management  Judah Chevere 11/07/2017, 8:02 PM

## 2017-11-08 ENCOUNTER — Inpatient Hospital Stay (HOSPITAL_COMMUNITY): Payer: Medicare HMO

## 2017-11-08 DIAGNOSIS — J9601 Acute respiratory failure with hypoxia: Secondary | ICD-10-CM

## 2017-11-08 DIAGNOSIS — R0902 Hypoxemia: Secondary | ICD-10-CM

## 2017-11-08 LAB — CBC
HCT: 31.9 % — ABNORMAL LOW (ref 39.0–52.0)
Hemoglobin: 10.6 g/dL — ABNORMAL LOW (ref 13.0–17.0)
MCH: 35.9 pg — ABNORMAL HIGH (ref 26.0–34.0)
MCHC: 33.2 g/dL (ref 30.0–36.0)
MCV: 108.1 fL — ABNORMAL HIGH (ref 78.0–100.0)
PLATELETS: 82 10*3/uL — AB (ref 150–400)
RBC: 2.95 MIL/uL — ABNORMAL LOW (ref 4.22–5.81)
RDW: 18.6 % — AB (ref 11.5–15.5)
WBC: 5.9 10*3/uL (ref 4.0–10.5)

## 2017-11-08 LAB — BLOOD GAS, ARTERIAL
ACID-BASE DEFICIT: 0.8 mmol/L (ref 0.0–2.0)
Acid-base deficit: 0.6 mmol/L (ref 0.0–2.0)
BICARBONATE: 25.3 mmol/L (ref 20.0–28.0)
BICARBONATE: 24.7 mmol/L (ref 20.0–28.0)
DRAWN BY: 41422
FIO2: 1
FIO2: 100
LHR: 16 {breaths}/min
MECHVT: 530 mL
MECHVT: 530 mL
O2 SAT: 99.4 %
O2 Saturation: 99.2 %
PCO2 ART: 51.8 mmHg — AB (ref 32.0–48.0)
PEEP/CPAP: 5 cmH2O
PEEP/CPAP: 5 cmH2O
PH ART: 7.278 — AB (ref 7.350–7.450)
PH ART: 7.3 — AB (ref 7.350–7.450)
PO2 ART: 227 mmHg — AB (ref 83.0–108.0)
PO2 ART: 171 mmHg — AB (ref 83.0–108.0)
Patient temperature: 98.6
Patient temperature: 98.6
RATE: 16 resp/min
pCO2 arterial: 55.8 mmHg — ABNORMAL HIGH (ref 32.0–48.0)

## 2017-11-08 LAB — GLUCOSE, CAPILLARY
GLUCOSE-CAPILLARY: 165 mg/dL — AB (ref 65–99)
GLUCOSE-CAPILLARY: 136 mg/dL — AB (ref 65–99)
GLUCOSE-CAPILLARY: 142 mg/dL — AB (ref 65–99)
Glucose-Capillary: 141 mg/dL — ABNORMAL HIGH (ref 65–99)
Glucose-Capillary: 142 mg/dL — ABNORMAL HIGH (ref 65–99)
Glucose-Capillary: 142 mg/dL — ABNORMAL HIGH (ref 65–99)
Glucose-Capillary: 147 mg/dL — ABNORMAL HIGH (ref 65–99)
Glucose-Capillary: 147 mg/dL — ABNORMAL HIGH (ref 65–99)
Glucose-Capillary: 148 mg/dL — ABNORMAL HIGH (ref 65–99)

## 2017-11-08 LAB — URINALYSIS, ROUTINE W REFLEX MICROSCOPIC
GLUCOSE, UA: NEGATIVE mg/dL
Hgb urine dipstick: NEGATIVE
KETONES UR: NEGATIVE mg/dL
LEUKOCYTES UA: NEGATIVE
Nitrite: NEGATIVE
PH: 6 (ref 5.0–8.0)
Protein, ur: NEGATIVE mg/dL
SPECIFIC GRAVITY, URINE: 1.015 (ref 1.005–1.030)

## 2017-11-08 LAB — HEPATITIS PANEL, ACUTE
HCV Ab: <0.1 s/co ratio (ref 0.0–0.9)
Hep A IgM: NEGATIVE
Hep B C IgM: NEGATIVE
Hepatitis B Surface Ag: NEGATIVE

## 2017-11-08 LAB — MRSA PCR SCREENING: MRSA BY PCR: NEGATIVE

## 2017-11-08 LAB — TROPONIN I: Troponin I: 0.03 ng/mL (ref <0.03)

## 2017-11-08 LAB — BASIC METABOLIC PANEL
ANION GAP: 10 (ref 5–15)
BUN: 8 mg/dL (ref 6–20)
CALCIUM: 6.7 mg/dL — AB (ref 8.9–10.3)
CO2: 22 mmol/L (ref 22–32)
Chloride: 98 mmol/L — ABNORMAL LOW (ref 101–111)
Creatinine, Ser: 0.68 mg/dL (ref 0.61–1.24)
Glucose, Bld: 145 mg/dL — ABNORMAL HIGH (ref 65–99)
Potassium: 4 mmol/L (ref 3.5–5.1)
Sodium: 130 mmol/L — ABNORMAL LOW (ref 135–145)

## 2017-11-08 LAB — TRIGLYCERIDES: Triglycerides: 148 mg/dL (ref <150)

## 2017-11-08 LAB — LACTIC ACID, PLASMA: Lactic Acid, Venous: 2.6 mmol/L (ref 0.5–1.9)

## 2017-11-08 LAB — AMMONIA: Ammonia: 174 umol/L — ABNORMAL HIGH (ref 9–35)

## 2017-11-08 LAB — STREP PNEUMONIAE URINARY ANTIGEN: Strep Pneumo Urinary Antigen: NEGATIVE

## 2017-11-08 MED ORDER — SPIRONOLACTONE 50 MG PO TABS
50.0000 mg | ORAL_TABLET | Freq: Every day | ORAL | Status: DC
  Administered 2017-11-08: 50 mg via ORAL
  Filled 2017-11-08 (×2): qty 1

## 2017-11-08 MED ORDER — FENTANYL CITRATE (PF) 100 MCG/2ML IJ SOLN
INTRAMUSCULAR | Status: AC
  Administered 2017-11-08: 100 ug via INTRAVENOUS
  Filled 2017-11-08: qty 2

## 2017-11-08 MED ORDER — FOLIC ACID 5 MG/ML IJ SOLN
1.0000 mg | Freq: Every day | INTRAMUSCULAR | Status: DC
  Administered 2017-11-08 – 2017-11-10 (×3): 1 mg via INTRAVENOUS
  Filled 2017-11-08 (×4): qty 0.2

## 2017-11-08 MED ORDER — ORAL CARE MOUTH RINSE
15.0000 mL | Freq: Four times a day (QID) | OROMUCOSAL | Status: DC
  Administered 2017-11-08 – 2017-11-11 (×13): 15 mL via OROMUCOSAL

## 2017-11-08 MED ORDER — PROPOFOL 1000 MG/100ML IV EMUL
INTRAVENOUS | Status: AC
  Filled 2017-11-08: qty 100

## 2017-11-08 MED ORDER — VANCOMYCIN HCL 10 G IV SOLR
2000.0000 mg | Freq: Once | INTRAVENOUS | Status: AC
  Administered 2017-11-08: 2000 mg via INTRAVENOUS
  Filled 2017-11-08: qty 2000

## 2017-11-08 MED ORDER — FENTANYL CITRATE (PF) 100 MCG/2ML IJ SOLN: 100.0000 ug | INTRAMUSCULAR | Status: DC | PRN

## 2017-11-08 MED ORDER — FENTANYL CITRATE (PF) 100 MCG/2ML IJ SOLN
100.0000 ug | INTRAMUSCULAR | Status: DC | PRN
  Filled 2017-11-08: qty 2

## 2017-11-08 MED ORDER — THIAMINE HCL 100 MG/ML IJ SOLN
100.0000 mg | Freq: Every day | INTRAMUSCULAR | Status: DC
  Administered 2017-11-08 – 2017-11-10 (×3): 100 mg via INTRAVENOUS
  Filled 2017-11-08 (×3): qty 1

## 2017-11-08 MED ORDER — VITAMIN K1 10 MG/ML IJ SOLN
5.0000 mg | Freq: Every day | INTRAVENOUS | Status: AC
  Administered 2017-11-08 – 2017-11-09 (×2): 5 mg via INTRAVENOUS
  Filled 2017-11-08 (×2): qty 0.5

## 2017-11-08 MED ORDER — MIDAZOLAM HCL 2 MG/2ML IJ SOLN
2.0000 mg | INTRAMUSCULAR | Status: DC | PRN
  Administered 2017-11-08 – 2017-11-09 (×7): 2 mg via INTRAVENOUS
  Filled 2017-11-08 (×7): qty 2

## 2017-11-08 MED ORDER — MIDAZOLAM HCL 2 MG/2ML IJ SOLN: 2.0000 mg | Freq: Once | INTRAMUSCULAR | Status: DC

## 2017-11-08 MED ORDER — FENTANYL 2500MCG IN NS 250ML (10MCG/ML) PREMIX INFUSION
25.0000 ug/h | INTRAVENOUS | Status: DC
  Administered 2017-11-08: 100 ug/h via INTRAVENOUS
  Administered 2017-11-08: 200 ug/h via INTRAVENOUS
  Administered 2017-11-09: 300 ug/h via INTRAVENOUS
  Administered 2017-11-10: 200 ug/h via INTRAVENOUS
  Filled 2017-11-08 (×4): qty 250

## 2017-11-08 MED ORDER — PROPOFOL 1000 MG/100ML IV EMUL
0.0000 ug/kg/min | INTRAVENOUS | Status: DC
  Administered 2017-11-08: 25 ug/kg/min via INTRAVENOUS
  Administered 2017-11-08: 40 ug/kg/min via INTRAVENOUS
  Administered 2017-11-08: 01:00:00 via INTRAVENOUS
  Filled 2017-11-08: qty 100

## 2017-11-08 MED ORDER — FENTANYL BOLUS VIA INFUSION
25.0000 ug | INTRAVENOUS | Status: DC | PRN
  Administered 2017-11-09: 25 ug via INTRAVENOUS
  Filled 2017-11-08: qty 25

## 2017-11-08 MED ORDER — FUROSEMIDE 20 MG PO TABS
20.0000 mg | ORAL_TABLET | Freq: Every day | ORAL | Status: DC
  Administered 2017-11-08: 20 mg
  Filled 2017-11-08 (×2): qty 1

## 2017-11-08 MED ORDER — FENTANYL CITRATE (PF) 100 MCG/2ML IJ SOLN
50.0000 ug | Freq: Once | INTRAMUSCULAR | Status: AC
  Administered 2017-11-08: 100 ug via INTRAVENOUS

## 2017-11-08 MED ORDER — VANCOMYCIN HCL 10 G IV SOLR
1500.0000 mg | Freq: Two times a day (BID) | INTRAVENOUS | Status: DC
  Administered 2017-11-08 – 2017-11-09 (×2): 1500 mg via INTRAVENOUS
  Filled 2017-11-08 (×3): qty 1500

## 2017-11-08 MED ORDER — DEXTROSE-NACL 5-0.9 % IV SOLN
INTRAVENOUS | Status: DC
  Administered 2017-11-08: 09:00:00 via INTRAVENOUS

## 2017-11-08 MED ORDER — CHLORHEXIDINE GLUCONATE 0.12% ORAL RINSE (MEDLINE KIT)
15.0000 mL | Freq: Two times a day (BID) | OROMUCOSAL | Status: DC
  Administered 2017-11-08 – 2017-11-11 (×7): 15 mL via OROMUCOSAL

## 2017-11-08 NOTE — Progress Notes (Signed)
Pharmacy Antibiotic Note  Faythe Gheearon P Henery is a 57 y.o. male admitted on 11/07/2017 with AMS.  Pharmacy has been consulted for ceftriaxone for SBP and vancomcyin for cellulitis dosing.  Plan: Ceftriaone 2G IV QD Vancomycin 1500mg  IV every 12 hours.  Goal trough 10-15 mcg/mL. Monitor clinical progression and LOT  Height: 5\' 8"  (172.7 cm) Weight: 195 lb 15.8 oz (88.9 kg) IBW/kg (Calculated) : 68.4  Temp (24hrs), Avg:96.7 F (35.9 C), Min:96.1 F (35.6 C), Max:97.8 F (36.6 C)  Recent Labs  Lab 11/07/17 1306 11/07/17 1436 11/07/17 1622 11/07/17 1754 11/07/17 1839 11/07/17 1938 11/08/17 0318  WBC 10.6*  --   --  7.2  --   --  5.9  CREATININE 0.76  --   --  0.63  --   --  0.68  LATICACIDVEN  --  3.76* 2.89*  --  2.3* 2.9* 2.6*    Estimated Creatinine Clearance: 113 mL/min (by C-G formula based on SCr of 0.68 mg/dL).    Allergies  Allergen Reactions  . Temsirolimus Other (See Comments)    Unknown - Pt does not recognize this medication [facility]  . Ketorolac Tromethamine Hives    Antimicrobials this admission: CFTX 3/16>> Vanc 3/17>>  Thank you for allowing pharmacy to be a part of this patient's care.  Toniann Failony L Jesaiah Fabiano 11/08/2017 11:44 AM

## 2017-11-08 NOTE — Progress Notes (Signed)
Critical care attending progress note:  Reason for admission:  Decompensated cirrhosis with hepatic encephalopathy   Past medical history: Alcohol abuse, PTSD, chronic pain.   Admission history and course in hospital:  Increasing abdominal girth, jaundice and confusion.  Significant 24-hour events:  Intubated for airway protection and increase work of breathing. Hypoglycemic episodes.  On rounds today:  Principal condition limiting discharge: ongoing encephalopathy.  Principal safety issue: self extubation.  CNS:  Sedation vacation with dose reduction as appropriate: on fentanyl infusion with versed prn. Difficult to wean as becomes rapidly agitated and dyssynchronous with ventilator. On chronic opioids. Will orient to voice and follow commands in all four limbs, generalized weakness.  - start oral narcotics to limit infusion dose. - continue prn BDZ and consider adding enteral dose.   Respiratory:  VAP bundle: yes Appropriate for SBT: no due to sedation. Dyssynchronous with breath stacking, high peak pressure. ++ wheezing through (low pitch). - Start bronchodilators. - Attempt PSV ventilation  Cardiovascular:  Secondary prevention CAD: n/a Line removal: PIVx 2. BP soft with systolic 90's MAP: 77  - BP  Should improve with sedation reduction and improved airway obstruction.  GI/nutrition:  Stress ulcer prophylaxis: Protonix. INR: 1.9 vit K given. PLT: 84.  Albumin 1.6, ammonia 140. Laxative regimen: lactulose q8h.  Enteral nutrition: needs EN. Abdomen protruberant but BS +  Renal:  Foley catheter removal:  Needed for close U/o monitoring.  Anasarca.  Hyponatremia due to edema/cirrhosis. Cr has increased. Lasix/spironolactone stopped. Hypomagnesemia - 2 g given.  Renal dose adjustment: vancomycin   Hematology:   DVT prophylaxis: UFH. Plt: 84 Transfusion indications: HB 10.  Infectious diseases:  Antibiotic de-escalation: On vancomycin and rocephin for  cellulitis. No leukocytosis. - D/c antibiotics if PCT negative.  Endocrine:  Glycemic target: 70-180. Well controlled. - Start moderate SSI to cover tube feeds. Hold long acting while Cr remains elevated.  Disposition: continue to wean sedation and move to extubation as tolerated.  Patient remains critically ill due to respiratory and hepatic failure requiring mechanical ventilation and titration of sedation..  They remain at high risk for life/limb threatening clinical deterioration requiring frequent reassessment and modifications of care.  Services provided include examination of the patient, review of relevant ancillary tests, prescription of lifesaving therapies, review of medications and prophylactic therapy and multidisciplinary rounding.  Total critical care time excluding separately billable procedures: 45  Minutes.  [name] Catalanavi Zayleigh Stroh, MD Critical Care Attending.

## 2017-11-08 NOTE — Progress Notes (Signed)
Placed pt back on previous PRVC settings of 530, RR 16 and PEEP +5 d/t sedation medication making pt RR drop.

## 2017-11-08 NOTE — Progress Notes (Addendum)
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  He drinks approximately 2 bottles of wine daily.  Family notes his abdomen is been, distended probably over the last year but acutely so over the last 3-4 weeks.  He does have  posttraumatic stress disorder and chronic pain [facility] has been decreasing his opioids.  May be limited to his increased alcohol ingestion.  [facility] after being recently treated for pneumonia and flu approximately 2-3 weeks ago.  Family noticed that he became jaundice, somnolent, and brought him to the emergency room to be evaluated.  Pulmonary critical care called to evaluate.  Due to his tenuous state he will be admitted to the intensive care unit good possibility he may end up intubated and on mechanical ventilatory support.  SUBJECTIVE:  Intubated overnight for respiratory suppression, tachypnea and increased work of breathing Has continued to have some episodes of hypoglycemia   VITAL SIGNS: BP (!) 81/63   Pulse 84   Temp (!) 97 F (36.1 C) (Core (Comment))   Resp 10   Ht 5\' 8"  (1.727 m)   Wt 88.9 kg (195 lb 15.8 oz)   SpO2 97%   BMI 29.80 kg/m    HEMODYNAMICS:    VENTILATOR SETTINGS: Vent Mode: PSV;CPAP FiO2 (%):  [40 %-100 %] 40 % Set Rate:  [16 bmp] 16 bmp Vt Set:  [530 mL] 530 mL PEEP:  [5 cmH20] 5 cmH20 Pressure Support:  [10 cmH20] 10 cmH20 Plateau Pressure:  [14 cmH20] 14 cmH20  INTAKE / OUTPUT: I/O last 3 completed shifts: In: 2152.4 [I.V.:1022.4; IV Piggyback:1130] Out: 800 [Urine:800]  PHYSICAL EXAMINATION: General: Jaundiced, intubated, sedated Neuro: Currently on propofol, fentanyl,  unresponsive to voice HEENT: Scleral icterus present Cardiovascular: Regular, no murmur Lungs: Coarse bilaterally, no wheezing, no crackles Abdomen: Quite distended, firm, no tympany, unable to assess tenderness given his sedation, positive bowel sounds Musculoskeletal: Venous stasis changes bilateral lower extremities, erythema without warmth, some areas of drainage of clear fluid Skin: Jaundice  LABS:  BMET Recent Labs  Lab 11/07/17 1306 11/07/17 1754 11/08/17 0318  NA 132* 131* 130*  K 3.2* 2.8* 4.0  CL 98* 96* 98*  CO2 20* 22 22  BUN 9 7 8   CREATININE 0.76 0.63 0.68  GLUCOSE 55* 42* 145*    Electrolytes Recent Labs  Lab 11/07/17 1306 11/07/17 1754 11/08/17 0318  CALCIUM 6.5* 6.4* 6.7*  MG  --  1.3*  --   PHOS  --  2.5  --     CBC Recent Labs  Lab 11/07/17 1306 11/07/17 1754 11/08/17 0318  WBC 10.6* 7.2 5.9  HGB 10.8* 10.4* 10.6*  HCT 31.7* 30.7* 31.9*  PLT 117* 102* 82*    Coag's Recent Labs  Lab 11/07/17 1754  APTT 45*  INR 1.90    Sepsis Markers Recent Labs  Lab 11/07/17 1754 11/07/17 1839 11/07/17 1938 11/08/17 0318  LATICACIDVEN  --  2.3* 2.9* 2.6*  PROCALCITON 0.37  --   --   --     ABG Recent Labs  Lab 11/08/17 0255 11/08/17 0517  PHART 7.278* 7.300*  PCO2ART 55.8* 51.8*  PO2ART 227* 171*  Liver Enzymes Recent Labs  Lab 11/07/17 1306 11/07/17 1754  AST 149* 140*  ALT 55 53  ALKPHOS 366* 344*  BILITOT 9.5* 8.9*  ALBUMIN 1.9* 1.8*    Cardiac Enzymes Recent Labs  Lab 11/07/17 1754 11/08/17 0031 11/08/17 0318  TROPONINI <0.03 0.03* <0.03    Glucose Recent Labs  Lab 11/07/17 1856 11/07/17 2041 11/07/17 2206 11/07/17 2358 11/08/17 0422 11/08/17 0746  GLUCAP 60* 64* 125* 142* 142* 147*    Imaging Dg Chest Port 1 View  Result Date: 11/08/2017 CLINICAL DATA:  Endotracheal and orogastric tube placement EXAM: PORTABLE CHEST 1 VIEW COMPARISON:  Chest radiograph 11/07/2017 FINDINGS: Endotracheal tube tip  is just above the carina. This should be retracted by 3-4 cm. The orogastric tube tip and side-port in the stomach. Unchanged cardiomediastinal contours. Pulmonary vascular congestion. IMPRESSION: Recommend retraction of endotracheal tube by 3-4 cm to place the tip at the level of the clavicular heads. Intragastric location of orogastric tube tip and side port. Electronically Signed   By: Deatra Robinson M.D.   On: 11/08/2017 01:52   Dg Chest Portable 1 View  Result Date: 11/07/2017 CLINICAL DATA:  57 year old male with history of lower extremity swelling bilaterally for the past 3 days. EXAM: PORTABLE CHEST 1 VIEW COMPARISON:  Chest x-ray 10/16/2017. FINDINGS: Lung volumes are normal. No consolidative airspace disease. No pleural effusions. No pneumothorax. No pulmonary nodule or mass noted. Pulmonary vasculature and the cardiomediastinal silhouette are within normal limits. Atherosclerosis in the thoracic aorta. Orthopedic fixation hardware in the sternum incidentally noted. Multiple old healed fractures of the ribs bilaterally. IMPRESSION: 1.  No radiographic evidence of acute cardiopulmonary disease. 2. Aortic atherosclerosis. 3. Additional incidental findings, as above. Electronically Signed   By: Trudie Reed M.D.   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  He drinks approximately 2 bottles of wine daily.  Family notes his abdomen is been, distended probably over the last year but acutely so over the last 3-4 weeks.  He does have  posttraumatic stress disorder and chronic pain [facility] has been decreasing his opioids.  May be limited to his  increased alcohol ingestion.  He presents to the emergency room at [facility] after being recently treated for pneumonia and flu approximately 2-3 weeks ago.  Family noticed that he became jaundice, somnolent, and brought him to the emergency room to be evaluated.  Intubated 3/17 a.m.  ASSESSMENT / PLAN:  PULMONARY A: Acute respiratory failure P:   Continue current mechanical ventilation. Push for spontaneous breathing when his mental status will allow VAP prevention orders  CARDIOVASCULAR A:  Hemodynamically stable, at risk for shock/sepsis P:  Continue telemetry Will need to start Aldactone, Lasix if blood pressure 3/17 will tolerate  RENAL A:   Hypokalemia P:   Replace electrolytes as indicated  GASTROINTESTINAL A:   Alcoholic cirrhosis Acute on chronic liver failure Hepatitis panel negative 3/16 Diarrhea, recent antibiotics P:   Not enough ascites to tap 3/16, treating possible SBP empirically Receiving lactulose, follow mental status Diarrhea appears to have resolved, no poorly formed stools.  I will discontinue his C. difficile testing and enteric pathogen isolation 3/17 If  his abdomen remains distended and in absence of any significant ascites, consider CT scan of his abdomen depending on the clinical course.  HEMATOLOGIC A:   Chronic anemia Hepatic coagulopathy, INR 1.9 P:  Heparin, anticoagulation on hold Follow CBC, coags Vitamin K 3/17 and 3/18  INFECTIOUS A:   Recent antibiotics for community-acquired pneumonia, doxycycline (completed) Suspected SBP Possible bilateral lower extremity cellulitis p:   Culture data pending Ceftriaxone started 3/16 for possible SBP Add vancomycin on 3/17 for suspected severe cellulitis Wound care consult for lower extremities Consider further imaging lower extremities, no crepitus noted  ENDOCRINE A:   Diabetes mellitus Hypoglycemia due to continuation of insulin despite having not eaten for days and be  in ill. P:   Convert D10 infusion to D5 normal saline Hold all insulin CBG every 2 hours until we ensure that no hypoglycemia on current regimen, then convert to a longer interval   NEUROLOGIC A:   Hepatic encephalopathy, possible contribution of toxic metabolic and hypoglycemia p:   RASS goal: 0 Continue lactulose scheduled Stop propofol Continue fentanyl drip and try to wean Versed as needed   FAMILY  - Updates: Wife updated at bedside 3/16  - Inter-disciplinary family meet or Palliative Care meeting due by: 11/14/17  Independent critical care time 34 minutes   Levy Pupa, MD, PhD 11/08/2017, 9:24 AM [facility] [phone] or if no answer [phone]

## 2017-11-08 NOTE — Progress Notes (Signed)
eLink Physician-Brief Progress Note Patient Name: [name] DOB: 01/11/1961 MRN: [id number]   Date of Service  11/08/2017  HPI/Events of Note  Liquid stools d/t Lactulose. Request for Flexiseal.   eICU Interventions  Will order Flexiseal.      Intervention Category Major Interventions: Other:  [name],Chaunda Vandergriff [place] 11/08/2017, 11:50 PM

## 2017-11-08 NOTE — Consult Note (Signed)
[place] Reason for consult: Hepatic failure Referring Physician: Emergency room.  PCP: Dr. Eilene Ghazi [facility].  Primary GI: None patient is unassigned  [name] is an 57 y.o. male.  HPI: He has a history of alcohol abuse.  He was recently hospitalized for pneumonia and was treated with IV antibiotics and Ceftin.  He has had problems of increased swelling of his legs which have been going on for some time.  According to the PCP note he is on antihypertensives and hydrochlorothiazide with a normal blood pressure.  He was taking Zithromax as well as doxycycline for his pneumonia since he could not afford the Ceftin.  He has had problems with peripheral neuropathy due to his diabetes.  He has had chronic back pain compression fractures.  He came back to the emergency room yesterday with complaints of severe diarrhea peripheral edema shortness of breath and progressive jaundice.  He initially denied drinking alcohol and apparently blame this on Tylenol but then subsequently admitted that he was drinking heavily which has been in his previous medical records.  He apparently admits and his family confirm that he drinks 2 bottles of wine or more per day at the current time.  He was fluid overloaded and appeared critically ill.  His liver was noted to be enlarged.  The patient was jaundice with a total bilirubin of 9.5 AST 149.  Platelet count 117.  Ammonia level 150.  He was admitted to critical care medicine and unfortunately had progressive shortness of breath and difficulty breathing that required intubation.  He now is intubated and sedated.  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  His family is not currently available to confirm any of this but this is apparently his first episode of being this jaundice.  Other notable labs indicate negative hepatitis A, B, and C serology.  His INR is 1.9 and pro time 21.6.  He has received some  empiric vitamin K.  He has also been given lactulose for presumed hepatic encephalopathy.  Currently, he is receiving lactulose, Protonix, Spironolactone and furosemide.  He is receiving Rocephin and vancomycin for presumed SBP.  Many cultures have been obtained but are still pending.  CT scan shows diffuse pancreatic atrophy but his amylase is elevated.  There is diffuse fatty liver.  There is thickening of the stomach and small bowel slightly but no gross evidence of ascites.    Past Medical History:  Diagnosis Date  . Chronic lower back pain   . Compression fracture    lumbar 3  . Diabetes mellitus   . Fracture acetabulum-closed ([place]) 04/09/2013  . GERD (gastroesophageal reflux disease)   . H/O Legionnaire's disease   . Hepatomegaly   . Migraine   . Osteoporosis   . Pancreatitis   . PTSD (post-traumatic stress disorder)   . Vitamin D deficiency     Past Surgical History:  Procedure Laterality Date  . BACK SURGERY    . CHOLECYSTECTOMY    . HERNIA REPAIR    . KYPHOPLASTY N/A 07/16/2015   Procedure: Lumbar three kyphoplasty;  Surgeon: Ashok Pall, MD;  Location: [facility];  Service: Neurosurgery;  Laterality: N/A;  Lumbar three kyphoplasty  . LAMINECTOMY    . MASS EXCISION Left 06/12/2017   Procedure: EXCISION LEFT AXILLARY SEBACEOUS CYST;  Surgeon: Coralie Keens, MD;  Location: [facility];  Service: General;  Laterality: Left;  . STERNUM FRACTURE SURGERY  Family History  Problem Relation Age of Onset  . Cancer Unknown        breast/grandmother, prostate/grandfather    Social History:  reports that he quit smoking about 10 years ago. His smoking use included cigarettes. He has a 13.00 pack-year smoking history. he has never used smokeless tobacco. He reports that he drinks about 3.6 oz of alcohol per week. He reports that he does not use drugs.  Allergies:  Allergies  Allergen Reactions  . Temsirolimus Other (See Comments)    Unknown - Pt does  not recognize this medication [facility]  . Ketorolac Tromethamine Hives    Medications; Prior to Admission medications   Medication Sig Start Date End Date Taking? Authorizing Provider  albuterol (PROVENTIL HFA;VENTOLIN HFA) 108 (90 Base) MCG/ACT inhaler Inhale 2 puffs into the lungs every 6 (six) hours as needed for wheezing or shortness of breath. 10/16/17  Yes Terald Sleeper, PA-C  carboxymethylcellulose (REFRESH PLUS) 0.5 % SOLN Place 1 drop into both eyes 3 (three) times daily as needed (dry eyes).   Yes [provider]  gabapentin (NEURONTIN) 400 MG capsule Take 3 capsules (1,200 mg total) by mouth 3 (three) times daily. 10/22/17  Yes Dettinger, Fransisca Kaufmann, MD  guaiFENesin (MUCINEX) 600 MG 12 hr tablet Take 2 tablets (1,200 mg total) by mouth 2 (two) times daily. 10/18/17  Yes Mariel Aloe, MD  hydrochlorothiazide (HYDRODIURIL) 25 MG tablet Take 0.5 tablets (12.5 mg total) by mouth 2 (two) times daily. 10/22/17  Yes Dettinger, Fransisca Kaufmann, MD  ibuprofen (ADVIL,MOTRIN) 200 MG tablet Take 600 mg by mouth every 6 (six) hours as needed (back pain).   Yes [provider]  insulin aspart (NOVOLOG FLEXPEN) 100 UNIT/ML FlexPen INJECT 7 TO 15 UNITS subcu up to 4 times daily Patient taking differently: INJECT 9 - 17 UNITS SQ UP TO FOUR TIMES A DAY 10/23/17  Yes Dettinger, Fransisca Kaufmann, MD  Insulin Glargine (LANTUS SOLOSTAR) 100 UNIT/ML Solostar Pen Inject 37 Units into the skin daily. Patient taking differently: Inject 33 Units into the skin daily.  10/22/17  Yes Dettinger, Fransisca Kaufmann, MD  Magnesium 500 MG TABS Take 500 mg by mouth daily.   Yes [provider]  methocarbamol (ROBAXIN) 500 MG tablet Take 500 mg by mouth 2 (two) times daily as needed for muscle spasms.   Yes [provider]  morphine (MSIR) 15 MG tablet Take 15 mg by mouth 3 (three) times daily.    Yes [provider]  pantoprazole (PROTONIX) 40 MG tablet Take 1 tablet (40 mg total) by mouth  daily. 10/22/17  Yes Dettinger, Fransisca Kaufmann, MD  polyethylene glycol (MIRALAX / GLYCOLAX) packet Take 17 g by mouth 2 (two) times daily. 12/01/16  Yes Reyne Dumas, MD  POTASSIUM PO Take 1 tablet by mouth at bedtime as needed (leg cramps).   Yes [provider]  Blood Glucose Monitoring Suppl (ACCU-CHEK AVIVA PLUS) w/Device KIT 1 each by Does not apply route daily. 10/23/17   Dettinger, Fransisca Kaufmann, MD  Glucose Blood (BLOOD GLUCOSE TEST STRIPS) STRP 1 strip by In Vitro route 4 (four) times daily. 10/23/17   Dettinger, Fransisca Kaufmann, MD   . chlorhexidine gluconate (MEDLINE KIT)  15 mL Mouth Rinse BID  . folic acid  1 mg Intravenous Daily  . furosemide  20 mg Per Tube Daily  . lactulose  30 g Oral TID  . mouth rinse  15 mL Mouth Rinse QID  . pantoprazole (PROTONIX) IV  40  mg Intravenous QHS  . spironolactone  50 mg Oral Daily  . thiamine injection  100 mg Intravenous Daily   PRN Meds sodium chloride, fentaNYL, midazolam Results for orders placed or performed during the hospital encounter of 11/07/17 (from the past 48 hour(s))  Lipase, blood     Status: None   Collection Time: 11/07/17  1:06 PM  Result Value Ref Range   Lipase 16 11 - 51 U/L    Comment: Performed at [facility], [place] [address] [place], [place] [address]  Comprehensive metabolic panel     Status: Abnormal   Collection Time: 11/07/17  1:06 PM  Result Value Ref Range   Sodium 132 (L) 135 - 145 mmol/L   Potassium 3.2 (L) 3.5 - 5.1 mmol/L   Chloride 98 (L) 101 - 111 mmol/L   CO2 20 (L) 22 - 32 mmol/L   Glucose, Bld 55 (L) 65 - 99 mg/dL   BUN 9 6 - 20 mg/dL   Creatinine, Ser 0.76 0.61 - 1.24 mg/dL   Calcium 6.5 (L) 8.9 - 10.3 mg/dL   Total Protein 5.9 (L) 6.5 - 8.1 g/dL   Albumin 1.9 (L) 3.5 - 5.0 g/dL   AST 149 (H) 15 - 41 U/L   ALT 55 17 - 63 U/L   Alkaline Phosphatase 366 (H) 38 - 126 U/L   Total Bilirubin 9.5 (H) 0.3 - 1.2 mg/dL   GFR calc non Af Amer >60 >60 mL/min   GFR calc Af Amer >60 >60 mL/min    Comment:  (NOTE) The eGFR has been calculated using the CKD EPI equation. This calculation has not been validated in all clinical situations. eGFR's persistently <60 mL/min signify possible Chronic Kidney Disease.    Anion gap 14 5 - 15    Comment: Performed at [facility] [address], [place], [place] [address]  CBC     Status: Abnormal   Collection Time: 11/07/17  1:06 PM  Result Value Ref Range   WBC 10.6 (H) 4.0 - 10.5 K/uL   RBC 2.91 (L) 4.22 - 5.81 MIL/uL   Hemoglobin 10.8 (L) 13.0 - 17.0 g/dL   HCT 31.7 (L) 39.0 - 52.0 %   MCV 108.9 (H) 78.0 - 100.0 fL   MCH 37.1 (H) 26.0 - 34.0 pg   MCHC 34.1 30.0 - 36.0 g/dL   RDW 19.0 (H) 11.5 - 15.5 %   Platelets 117 (L) 150 - 400 K/uL    Comment: REPEATED TO VERIFY SPECIMEN CHECKED FOR CLOTS PLATELET COUNT CONFIRMED BY SMEAR Performed at [facility] [address] [place], [place] [address]   Ammonia     Status: Abnormal   Collection Time: 11/07/17  2:01 PM  Result Value Ref Range   Ammonia 150 (H) 9 - 35 umol/L    Comment: Performed at [facility], [place] [address], [place], [place] [address]  POC CBG, ED     Status: Abnormal   Collection Time: 11/07/17  2:15 PM  Result Value Ref Range   Glucose-Capillary 33 (LL) 65 - 99 mg/dL   Comment 1 Repeat Test    Comment 2 Call MD NNP PA CNM   I-Stat CG4 Lactic Acid, ED     Status: Abnormal   Collection Time: 11/07/17  2:36 PM  Result Value Ref Range   Lactic Acid, Venous 3.76 (HH) 0.5 - 1.9 mmol/L   Comment NOTIFIED PHYSICIAN   CBG monitoring, ED  Status: Abnormal   Collection Time: 11/07/17  2:37 PM  Result Value Ref Range   Glucose-Capillary 114 (H) 65 - 99 mg/dL  I-Stat Troponin, ED (not at [facility])     Status: None   Collection Time: 11/07/17  2:40 PM  Result Value Ref Range   Troponin i, poc 0.00 0.00 - 0.08 ng/mL   Comment 3            Comment: Due to the release kinetics of cTnI, a negative result within the first hours of the onset of symptoms does not  rule out myocardial infarction with certainty. If myocardial infarction is still suspected, repeat the test at appropriate intervals.   I-Stat Venous Blood Gas, ED (order at [facility] and [facility] only)     Status: Abnormal   Collection Time: 11/07/17  2:49 PM  Result Value Ref Range   pH, Ven 7.294 7.250 - 7.430   pCO2, Ven 45.8 44.0 - 60.0 mmHg   pO2, Ven 51.0 (H) 32.0 - 45.0 mmHg   Bicarbonate 22.3 20.0 - 28.0 mmol/L   TCO2 24 22 - 32 mmol/L   O2 Saturation 81.0 %   Acid-base deficit 4.0 (H) 0.0 - 2.0 mmol/L   Patient temperature HIDE    Sample type VENOUS   CBG monitoring, ED     Status: Abnormal   Collection Time: 11/07/17  3:53 PM  Result Value Ref Range   Glucose-Capillary 13 (LL) 65 - 99 mg/dL   Comment 1 Notify RN    Comment 2 Call MD NNP PA CNM   CBG monitoring, ED     Status: Abnormal   Collection Time: 11/07/17  4:17 PM  Result Value Ref Range   Glucose-Capillary 108 (H) 65 - 99 mg/dL  I-Stat CG4 Lactic Acid, ED     Status: Abnormal   Collection Time: 11/07/17  4:22 PM  Result Value Ref Range   Lactic Acid, Venous 2.89 (HH) 0.5 - 1.9 mmol/L   Comment NOTIFIED PHYSICIAN   Comprehensive metabolic panel     Status: Abnormal   Collection Time: 11/07/17  5:54 PM  Result Value Ref Range   Sodium 131 (L) 135 - 145 mmol/L   Potassium 2.8 (L) 3.5 - 5.1 mmol/L   Chloride 96 (L) 101 - 111 mmol/L   CO2 22 22 - 32 mmol/L   Glucose, Bld 42 (LL) 65 - 99 mg/dL    Comment: CRITICAL RESULT CALLED TO, READ BACK BY AND VERIFIED WITH: CHRISTINA KELLY RN AT 1906 11/07/17 BY WOOLLENK    BUN 7 6 - 20 mg/dL   Creatinine, Ser 0.63 0.61 - 1.24 mg/dL   Calcium 6.4 (LL) 8.9 - 10.3 mg/dL    Comment: CRITICAL RESULT CALLED TO, READ BACK BY AND VERIFIED WITH: CHRISTINA KELLY RN AT 1906 11/07/17 BY WOOLLENK    Total Protein 5.6 (L) 6.5 - 8.1 g/dL   Albumin 1.8 (L) 3.5 - 5.0 g/dL   AST 140 (H) 15 - 41 U/L   ALT 53 17 - 63 U/L   Alkaline Phosphatase 344 (H) 38 - 126 U/L   Total Bilirubin 8.9 (H)  0.3 - 1.2 mg/dL   GFR calc non Af Amer >60 >60 mL/min   GFR calc Af Amer >60 >60 mL/min    Comment: (NOTE) The eGFR has been calculated using the CKD EPI equation. This calculation has not been validated in all clinical situations. eGFR's persistently <60 mL/min signify possible Chronic Kidney Disease.    Anion gap 13 5 -  15    Comment: Performed at [facility], [place] [address], [place], [place] [address]  Magnesium     Status: Abnormal   Collection Time: 11/07/17  5:54 PM  Result Value Ref Range   Magnesium 1.3 (L) 1.7 - 2.4 mg/dL    Comment: Performed at [facility] [address], [place], [place] [address]  Phosphorus     Status: None   Collection Time: 11/07/17  5:54 PM  Result Value Ref Range   Phosphorus 2.5 2.5 - 4.6 mg/dL    Comment: Performed at [facility] [address] [place], [place] [address]  Procalcitonin     Status: None   Collection Time: 11/07/17  5:54 PM  Result Value Ref Range   Procalcitonin 0.37 ng/mL    Comment:        Interpretation: PCT (Procalcitonin) <= 0.5 ng/mL: Systemic infection (sepsis) is not likely. Local bacterial infection is possible. (NOTE)       Sepsis PCT Algorithm           Lower Respiratory Tract                                      Infection PCT Algorithm    ----------------------------     ----------------------------         PCT < 0.25 ng/mL                PCT < 0.10 ng/mL         Strongly encourage             Strongly discourage   discontinuation of antibiotics    initiation of antibiotics    ----------------------------     -----------------------------       PCT 0.25 - 0.50 ng/mL            PCT 0.10 - 0.25 ng/mL               OR       >80% decrease in PCT            Discourage initiation of                                            antibiotics      Encourage discontinuation           of antibiotics    ----------------------------     -----------------------------         PCT >= 0.50 ng/mL               PCT 0.26 - 0.50 ng/mL               AND        <80% decrease in PCT             Encourage initiation of                                             antibiotics       Encourage continuation           of antibiotics    ----------------------------     -----------------------------  PCT >= 0.50 ng/mL                  PCT > 0.50 ng/mL               AND         increase in PCT                  Strongly encourage                                      initiation of antibiotics    Strongly encourage escalation           of antibiotics                                     -----------------------------                                           PCT <= 0.25 ng/mL                                                 OR                                        > 80% decrease in PCT                                     Discontinue / Do not initiate                                             antibiotics Performed at [facility], [address], [place], [place] [address]   Cortisol     Status: None   Collection Time: 11/07/17  5:54 PM  Result Value Ref Range   Cortisol, Plasma 13.7 ug/dL    Comment: (NOTE) AM    6.7 - 22.6 ug/dL PM   <10.0       ug/dL Performed at [facility] [address], [place], [place] [address]   CBC     Status: Abnormal   Collection Time: 11/07/17  5:54 PM  Result Value Ref Range   WBC 7.2 4.0 - 10.5 K/uL   RBC 2.84 (L) 4.22 - 5.81 MIL/uL   Hemoglobin 10.4 (L) 13.0 - 17.0 g/dL   HCT 30.7 (L) 39.0 - 52.0 %   MCV 108.1 (H) 78.0 - 100.0 fL   MCH 36.6 (H) 26.0 - 34.0 pg   MCHC 33.9 30.0 - 36.0 g/dL   RDW 18.9 (H) 11.5 - 15.5 %   Platelets 102 (L) 150 - 400 K/uL    Comment: SPECIMEN CHECKED FOR CLOTS REPEATED TO VERIFY PLATELET COUNT CONFIRMED BY SMEAR Performed at [facility] [address] [place], [place] [address]  Protime-INR     Status: Abnormal   Collection Time: 11/07/17  5:54 PM  Result Value Ref Range   Prothrombin Time 21.6  (H) 11.4 - 15.2 seconds   INR 1.90     Comment: Performed at [facility], [place] [address] [place], [place] [address]  APTT     Status: Abnormal   Collection Time: 11/07/17  5:54 PM  Result Value Ref Range   aPTT 45 (H) 24 - 36 seconds    Comment:        IF BASELINE aPTT IS ELEVATED, SUGGEST PATIENT RISK ASSESSMENT BE USED TO DETERMINE APPROPRIATE ANTICOAGULANT THERAPY. Performed at [facility], [place] [address], [place], [place] [address]   Amylase     Status: None   Collection Time: 11/07/17  5:54 PM  Result Value Ref Range   Amylase 45 28 - 100 U/L    Comment: Performed at [facility] [address] [place], [place] [address]  Lipase, blood     Status: None   Collection Time: 11/07/17  5:54 PM  Result Value Ref Range   Lipase 17 11 - 51 U/L    Comment: Performed at [facility] [address], [place], [place] [address]  Troponin I     Status: None   Collection Time: 11/07/17  5:54 PM  Result Value Ref Range   Troponin I <0.03 <0.03 ng/mL    Comment: Performed at [facility] [address], [place], [place] [address]  D-dimer, quantitative (not at [facility])     Status: Abnormal   Collection Time: 11/07/17  5:54 PM  Result Value Ref Range   D-Dimer, Quant 2.70 (H) 0.00 - 0.50 ug/mL-FEU    Comment: (NOTE) At the manufacturer cut-off of 0.50 ug/mL FEU, this assay has been documented to exclude PE with a sensitivity and negative predictive value of 97 to 99%.  At this time, this assay has not been approved by the FDA to exclude DVT/VTE. Results should be correlated with clinical presentation. Performed at [facility], [place] [address], [place], [place] [address]   Hepatitis panel, acute     Status: None   Collection Time: 11/07/17  5:54 PM  Result Value Ref Range   Hepatitis B Surface Ag Negative Negative   HCV Ab <0.1 0.0 - 0.9 s/co ratio    Comment: (NOTE)                                  Negative:     < 0.8                              Indeterminate: 0.8 - 0.9                                  Positive:     > 0.9 The CDC recommends that a positive HCV antibody result be followed up with a HCV Nucleic Acid Amplification test (681275). Performed At: [facility] [place], [place] [id number] Rush Farmer MD [id number]    Hep A IgM Negative Negative   Hep B C IgM Negative Negative    Comment: Performed at [facility], [place] [address], [place], [place] [address]  Culture, blood (routine x 2)  Status: None (Preliminary result)   Collection Time: 11/07/17  5:55 PM  Result Value Ref Range   Specimen Description BLOOD RIGHT HAND    Special Requests      BOTTLES DRAWN AEROBIC AND ANAEROBIC Blood Culture adequate volume Performed at [facility], [place] [address] [place], [place] [address]    Culture PENDING    Report Status PENDING   CBG monitoring, ED     Status: None   Collection Time: 11/07/17  6:28 PM  Result Value Ref Range   Glucose-Capillary 76 65 - 99 mg/dL  Lactic acid, plasma     Status: Abnormal   Collection Time: 11/07/17  6:39 PM  Result Value Ref Range   Lactic Acid, Venous 2.3 (HH) 0.5 - 1.9 mmol/L    Comment: CRITICAL RESULT CALLED TO, READ BACK BY AND VERIFIED WITH: Darcella Gasman RN AT 1919 11/07/17 BY Karie Chimera Performed at [facility], [place] [address], [place], [place] [address]   Glucose, capillary     Status: Abnormal   Collection Time: 11/07/17  6:56 PM  Result Value Ref Range   Glucose-Capillary 60 (L) 65 - 99 mg/dL   Comment 1 Capillary Specimen    Comment 2 Notify RN   Lactic acid, plasma     Status: Abnormal   Collection Time: 11/07/17  7:38 PM  Result Value Ref Range   Lactic Acid, Venous 2.9 (HH) 0.5 - 1.9 mmol/L    Comment: CRITICAL RESULT CALLED TO, READ BACK BY AND VERIFIED WITH: Rebbeca Paul RN AT 3220 11/07/17 BY Karie Chimera Performed at [facility], [place] [address], [place], [place] [address]   MRSA PCR Screening     Status:  None   Collection Time: 11/07/17  8:29 PM  Result Value Ref Range   MRSA by PCR NEGATIVE NEGATIVE    Comment:        The GeneXpert MRSA Assay (FDA approved for NASAL specimens only), is one component of a comprehensive MRSA colonization surveillance program. It is not intended to diagnose MRSA infection nor to guide or monitor treatment for MRSA infections. Performed at [facility], [place] [address], [place], [place] [address]   Glucose, capillary     Status: Abnormal   Collection Time: 11/07/17  8:41 PM  Result Value Ref Range   Glucose-Capillary 64 (L) 65 - 99 mg/dL   Comment 1 Notify RN   Glucose, capillary     Status: Abnormal   Collection Time: 11/07/17 10:06 PM  Result Value Ref Range   Glucose-Capillary 125 (H) 65 - 99 mg/dL   Comment 1 Notify RN   Glucose, capillary     Status: Abnormal   Collection Time: 11/07/17 11:58 PM  Result Value Ref Range   Glucose-Capillary 142 (H) 65 - 99 mg/dL  Troponin I     Status: Abnormal   Collection Time: 11/08/17 12:31 AM  Result Value Ref Range   Troponin I 0.03 (HH) <0.03 ng/mL    Comment: CRITICAL RESULT CALLED TO, READ BACK BY AND VERIFIED WITH: FLYNT F,RN 11/08/17 0143 WAYK Performed at [facility], [place] [address] [place],  [address]   Strep pneumoniae urinary antigen     Status: None   Collection Time: 11/08/17  2:09 AM  Result Value Ref Range   Strep Pneumo Urinary Antigen NEGATIVE NEGATIVE    Comment:        Infection due to S. pneumoniae cannot be absolutely ruled out since the antigen  present may be below the detection limit of the test. Performed at [facility], [place] [address] [place], [place] [address]   Urinalysis, Routine w reflex microscopic     Status: Abnormal   Collection Time: 11/08/17  2:09 AM  Result Value Ref Range   Color, Urine AMBER (A) YELLOW    Comment: BIOCHEMICALS MAY BE AFFECTED BY COLOR   APPearance CLEAR CLEAR   Specific Gravity, Urine 1.015 1.005 - 1.030    pH 6.0 5.0 - 8.0   Glucose, UA NEGATIVE NEGATIVE mg/dL   Hgb urine dipstick NEGATIVE NEGATIVE   Bilirubin Urine MODERATE (A) NEGATIVE   Ketones, ur NEGATIVE NEGATIVE mg/dL   Protein, ur NEGATIVE NEGATIVE mg/dL   Nitrite NEGATIVE NEGATIVE   Leukocytes, UA NEGATIVE NEGATIVE    Comment: Performed at [facility] [address] [place], [place] [address]  Draw ABG 1 hour after initiation of ventilator     Status: Abnormal   Collection Time: 11/08/17  2:55 AM  Result Value Ref Range   FIO2 1.00    Mode PRESSURE REGULATED VOLUME CONTROL    VT 530 mL   LHR 16 resp/min   Peep/cpap 5.0 cm H20   pH, Arterial 7.278 (L) 7.350 - 7.450   pCO2 arterial 55.8 (H) 32.0 - 48.0 mmHg   pO2, Arterial 227 (H) 83.0 - 108.0 mmHg   Bicarbonate 25.3 20.0 - 28.0 mmol/L   Acid-base deficit 0.6 0.0 - 2.0 mmol/L   O2 Saturation 99.4 %   Patient temperature 98.6    Collection site RIGHT RADIAL    Sample type ARTERIAL DRAW    Allens test (pass/fail) PASS PASS  CBC     Status: Abnormal   Collection Time: 11/08/17  3:18 AM  Result Value Ref Range   WBC 5.9 4.0 - 10.5 K/uL   RBC 2.95 (L) 4.22 - 5.81 MIL/uL   Hemoglobin 10.6 (L) 13.0 - 17.0 g/dL   HCT 31.9 (L) 39.0 - 52.0 %   MCV 108.1 (H) 78.0 - 100.0 fL   MCH 35.9 (H) 26.0 - 34.0 pg   MCHC 33.2 30.0 - 36.0 g/dL   RDW 18.6 (H) 11.5 - 15.5 %   Platelets 82 (L) 150 - 400 K/uL    Comment: CONSISTENT WITH PREVIOUS RESULT Performed at [facility], [address] [place], [place] [address]   Basic metabolic panel     Status: Abnormal   Collection Time: 11/08/17  3:18 AM  Result Value Ref Range   Sodium 130 (L) 135 - 145 mmol/L   Potassium 4.0 3.5 - 5.1 mmol/L   Chloride 98 (L) 101 - 111 mmol/L   CO2 22 22 - 32 mmol/L   Glucose, Bld 145 (H) 65 - 99 mg/dL   BUN 8 6 - 20 mg/dL   Creatinine, Ser 0.68 0.61 - 1.24 mg/dL   Calcium 6.7 (L) 8.9 - 10.3 mg/dL   GFR calc non Af Amer >60 >60 mL/min   GFR calc Af Amer >60 >60 mL/min    Comment:  (NOTE) The eGFR has been calculated using the CKD EPI equation. This calculation has not been validated in all clinical situations. eGFR's persistently <60 mL/min signify possible Chronic Kidney Disease.    Anion gap 10 5 - 15    Comment: Performed at [facility] [address], [place], [place] [address]  Troponin I     Status: None   Collection Time: 11/08/17  3:18 AM  Result Value Ref Range   Troponin I <0.03 <0.03 ng/mL    Comment: Performed at [facility], [place] [address] [place], [place] [address]  Ammonia     Status: Abnormal   Collection Time: 11/08/17  3:18 AM  Result Value Ref Range   Ammonia 174 (H) 9 - 35 umol/L    Comment: Performed at [facility], [place] [address] [place], [place] [address]  Triglycerides     Status: None   Collection Time: 11/08/17  3:18 AM  Result Value Ref Range   Triglycerides 148 <150 mg/dL    Comment: Performed at [facility] [address], [place], [place] [address]  Lactic acid, plasma     Status: Abnormal   Collection Time: 11/08/17  3:18 AM  Result Value Ref Range   Lactic Acid, Venous 2.6 (HH) 0.5 - 1.9 mmol/L    Comment: CRITICAL RESULT CALLED TO, READ BACK BY AND VERIFIED WITH: FLYNT F,RN 03//17/19 0426 WAYK Performed at [facility], [place] [address], [place], [place] [address]   Glucose, capillary     Status: Abnormal   Collection Time: 11/08/17  4:22 AM  Result Value Ref Range   Glucose-Capillary 142 (H) 65 - 99 mg/dL   Comment 1 Notify RN   Culture, respiratory (NON-Expectorated)     Status: None (Preliminary result)   Collection Time: 11/08/17  5:05 AM  Result Value Ref Range   Specimen Description TRACHEAL ASPIRATE    Special Requests NONE    Gram Stain      ABUNDANT WBC PRESENT,BOTH PMN AND MONONUCLEAR RARE SQUAMOUS EPITHELIAL CELLS PRESENT ABUNDANT GRAM POSITIVE COCCI IN PAIRS IN CLUSTERS ABUNDANT GRAM NEGATIVE COCCI IN PAIRS RARE GRAM POSITIVE RODS Performed at [facility], [place] [address] [place], [place] [address]    Culture PENDING    Report Status PENDING   Blood gas, arterial     Status: Abnormal   Collection Time: 11/08/17  5:17 AM  Result Value Ref Range   FIO2 100.00    Delivery systems VENTILATOR    Mode PRESSURE REGULATED VOLUME CONTROL    VT 530 mL   LHR 16 resp/min   Peep/cpap 5.0 cm H20   pH, Arterial 7.300 (L) 7.350 - 7.450   pCO2 arterial 51.8 (H) 32.0 - 48.0 mmHg   pO2, Arterial 171 (H) 83.0 - 108.0 mmHg   Bicarbonate 24.7 20.0 - 28.0 mmol/L   Acid-base deficit 0.8 0.0 - 2.0 mmol/L   O2 Saturation 99.2 %   Patient temperature 98.6    Collection site RIGHT RADIAL    Drawn by [phone]    Sample type ARTERIAL DRAW    Allens test (pass/fail) PASS PASS  Glucose, capillary     Status: Abnormal   Collection Time: 11/08/17  7:46 AM  Result Value Ref Range   Glucose-Capillary 147 (H) 65 - 99 mg/dL   Comment 1 Capillary Specimen    Comment 2 Notify RN   Glucose, capillary     Status: Abnormal   Collection Time: 11/08/17  9:55 AM  Result Value Ref Range   Glucose-Capillary 165 (H) 65 - 99 mg/dL   Comment 1 Capillary Specimen   Glucose, capillary     Status: Abnormal   Collection Time: 11/08/17 12:05 PM  Result Value Ref Range   Glucose-Capillary 141 (H) 65 - 99 mg/dL   Comment 1 Capillary Specimen    Comment 2 Notify RN     Dg  Chest Port 1 View  Result Date: 11/08/2017 CLINICAL DATA:  Endotracheal and orogastric tube placement EXAM: PORTABLE CHEST 1 VIEW COMPARISON:  Chest radiograph 11/07/2017 FINDINGS: Endotracheal tube tip is just above the carina. This should be retracted by 3-4 cm. The orogastric tube tip and side-port in the stomach. Unchanged cardiomediastinal contours. Pulmonary vascular congestion. IMPRESSION: Recommend retraction of endotracheal tube by 3-4 cm to place the tip at the level of the clavicular heads. Intragastric location of orogastric tube tip and side port. Electronically Signed   By: Ulyses Jarred M.D.   On:  11/08/2017 01:52   Dg Chest Portable 1 View  Result Date: 11/07/2017 CLINICAL DATA:  57 year old male with history of lower extremity swelling bilaterally for the past 3 days. EXAM: PORTABLE CHEST 1 VIEW COMPARISON:  Chest x-ray 10/16/2017. FINDINGS: Lung volumes are normal. No consolidative airspace disease. No pleural effusions. No pneumothorax. No pulmonary nodule or mass noted. Pulmonary vasculature and the cardiomediastinal silhouette are within normal limits. Atherosclerosis in the thoracic aorta. Orthopedic fixation hardware in the sternum incidentally noted. Multiple old healed fractures of the ribs bilaterally. IMPRESSION: 1.  No radiographic evidence of acute cardiopulmonary disease. 2. Aortic atherosclerosis. 3. Additional incidental findings, as above. Electronically Signed   By: Vinnie Langton M.D.   On: 11/07/2017 15:18   ROS: Unobtainable            Blood pressure (!) 102/56, pulse (!) 103, temperature (!) 97 F (36.1 C), temperature source Core (Comment), resp. rate 11, height _0  (1.727 m), weight 88.9 kg (195 lb 15.8 oz), SpO2 99 %.  Physical exam:   General--jaundiced white male intubated, heavily sedated and nonresponsive ENT--scleral icterus  Heart--tachycardia Lungs--ventilator breath sounds somewhat coarse Abdomen--marked distended no clear fluid wave.  Few bowel sounds present Psych--sedated and intubated   Assessment: 1.  Alcoholic liver disease.  Patient has continued to drink heavily.  MELD score based on current labs 26.  Statistically he has a 58-monthestimated survival of approximately 20%.  This could clearly improve if he is able to stop drinking. 2.  Respiratory failure requiring intubation.  Could be due to fluid overload was recently treated for pneumonia. 3.  Cirrhosis.  Patient does not really have much in the way of ascites but clearly could have SBP.  Agree with empiric lactulose and antibiotics 4.  Probable hepatic encephalopathy.   Patient receiving lactulose 5.  Pancreatic atrophy.  CT scan shows diffuse atrophy of the pancreas and this is probably what is been contributing to his diarrhea and to his diabetes.  In the future he will likely need pancreatic enzyme replacement  Plan: 1.  We will follow with you.  Agree with empiric therapy for SBP.  Long-term prognosis is not good as long as he continues to drink alcohol.   JNancy Fetter3/17/2019, 12:10 PM   This note was created using voice recognition software and minor errors may Have occurred unintentionally. Pager: [phone] no answer or after hours call [phone]

## 2017-11-08 NOTE — Procedures (Signed)
Intubation Procedure Note CLAUDE WALDMAN [id number] July 05, 1961  Procedure: Intubation Indications: Respiratory insufficiency  Procedure Details Consent: Risks of procedure as well as the alternatives and risks of each were explained to the (patient/caregiver).  Consent for procedure obtained. Time Out: Verified patient identification, verified procedure, site/side was marked, verified correct patient position, special equipment/implants available, medications/allergies/relevent history reviewed, required imaging and test results available.  Performed  Maximum sterile technique was used including antiseptics, gown, hand hygiene and mask.  MAC and 4 ETT 7.5 at 27   Evaluation Hemodynamic Status: BP stable throughout; O2 sats: transiently fell during during procedure Patient's Current Condition: stable Complications: No apparent complications Patient did tolerate procedure well. Chest X-ray ordered to verify placement.  CXR: pending.   Rise Paganini Scatliffe 11/08/2017

## 2017-11-09 LAB — GLUCOSE, CAPILLARY
GLUCOSE-CAPILLARY: 116 mg/dL — AB (ref 65–99)
GLUCOSE-CAPILLARY: 135 mg/dL — AB (ref 65–99)
GLUCOSE-CAPILLARY: 141 mg/dL — AB (ref 65–99)
GLUCOSE-CAPILLARY: 149 mg/dL — AB (ref 65–99)
Glucose-Capillary: 181 mg/dL — ABNORMAL HIGH (ref 65–99)
Glucose-Capillary: 182 mg/dL — ABNORMAL HIGH (ref 65–99)
Glucose-Capillary: 169 mg/dL — ABNORMAL HIGH (ref 65–99)

## 2017-11-09 LAB — CBC
HCT: 30 % — ABNORMAL LOW (ref 39.0–52.0)
HEMOGLOBIN: 10 g/dL — AB (ref 13.0–17.0)
MCH: 37.3 pg — ABNORMAL HIGH (ref 26.0–34.0)
MCHC: 33.3 g/dL (ref 30.0–36.0)
MCV: 111.9 fL — ABNORMAL HIGH (ref 78.0–100.0)
Platelets: 84 10*3/uL — ABNORMAL LOW (ref 150–400)
RBC: 2.68 MIL/uL — AB (ref 4.22–5.81)
RDW: 19.4 % — ABNORMAL HIGH (ref 11.5–15.5)
WBC: 8.8 10*3/uL (ref 4.0–10.5)

## 2017-11-09 LAB — URINE CULTURE: CULTURE: NO GROWTH

## 2017-11-09 LAB — HEPATIC FUNCTION PANEL
ALBUMIN: 1.6 g/dL — AB (ref 3.5–5.0)
ALT: 44 U/L (ref 17–63)
AST: 118 U/L — ABNORMAL HIGH (ref 15–41)
Alkaline Phosphatase: 321 U/L — ABNORMAL HIGH (ref 38–126)
Bilirubin, Direct: 7.5 mg/dL — ABNORMAL HIGH (ref 0.1–0.5)
Indirect Bilirubin: 5.2 mg/dL — ABNORMAL HIGH (ref 0.3–0.9)
TOTAL PROTEIN: 5.2 g/dL — AB (ref 6.5–8.1)
Total Bilirubin: 12.7 mg/dL — ABNORMAL HIGH (ref 0.3–1.2)

## 2017-11-09 LAB — BASIC METABOLIC PANEL
ANION GAP: 9 (ref 5–15)
BUN: 10 mg/dL (ref 6–20)
CO2: 24 mmol/L (ref 22–32)
Calcium: 6.7 mg/dL — ABNORMAL LOW (ref 8.9–10.3)
Chloride: 98 mmol/L — ABNORMAL LOW (ref 101–111)
Creatinine, Ser: 1.61 mg/dL — ABNORMAL HIGH (ref 0.61–1.24)
GFR calc Af Amer: 54 mL/min — ABNORMAL LOW (ref >60)
GFR, EST NON AFRICAN AMERICAN: 47 mL/min — AB (ref >60)
GLUCOSE: 146 mg/dL — AB (ref 65–99)
POTASSIUM: 3.9 mmol/L (ref 3.5–5.1)
Sodium: 131 mmol/L — ABNORMAL LOW (ref 135–145)

## 2017-11-09 LAB — PROTIME-INR
INR: 1.6
Prothrombin Time: 18.9 seconds — ABNORMAL HIGH (ref 11.4–15.2)

## 2017-11-09 LAB — VANCOMYCIN, TROUGH: Vancomycin Tr: 55 ug/mL (ref 15–20)

## 2017-11-09 LAB — MAGNESIUM
Magnesium: 1.7 mg/dL (ref 1.7–2.4)
Magnesium: 2.2 mg/dL (ref 1.7–2.4)

## 2017-11-09 LAB — AMMONIA: Ammonia: 140 umol/L — ABNORMAL HIGH (ref 9–35)

## 2017-11-09 LAB — PHOSPHORUS: PHOSPHORUS: 4.5 mg/dL (ref 2.5–4.6)

## 2017-11-09 MED ORDER — VITAL AF 1.2 CAL PO LIQD
1000.0000 mL | ORAL | Status: DC
  Administered 2017-11-09 – 2017-11-11 (×3): 1000 mL

## 2017-11-09 MED ORDER — HEPARIN SODIUM (PORCINE) 5000 UNIT/ML IJ SOLN
5000.0000 [IU] | Freq: Three times a day (TID) | INTRAMUSCULAR | Status: DC
  Administered 2017-11-09 – 2017-11-17 (×25): 5000 [IU] via SUBCUTANEOUS
  Filled 2017-11-09 (×27): qty 1

## 2017-11-09 MED ORDER — PRO-STAT SUGAR FREE PO LIQD
30.0000 mL | Freq: Two times a day (BID) | ORAL | Status: DC
  Administered 2017-11-09 – 2017-11-11 (×5): 30 mL
  Filled 2017-11-09 (×5): qty 30

## 2017-11-09 MED ORDER — MAGNESIUM SULFATE 2 GM/50ML IV SOLN
2.0000 g | Freq: Once | INTRAVENOUS | Status: AC
  Administered 2017-11-09: 2 g via INTRAVENOUS
  Filled 2017-11-09: qty 50

## 2017-11-09 MED ORDER — IPRATROPIUM-ALBUTEROL 0.5-2.5 (3) MG/3ML IN SOLN
RESPIRATORY_TRACT | Status: AC
  Filled 2017-11-09: qty 3

## 2017-11-09 MED ORDER — HYDROMORPHONE HCL 2 MG PO TABS
2.0000 mg | ORAL_TABLET | Freq: Four times a day (QID) | ORAL | Status: DC
  Administered 2017-11-09 – 2017-11-11 (×7): 2 mg via ORAL
  Filled 2017-11-09 (×7): qty 1

## 2017-11-09 MED ORDER — IPRATROPIUM-ALBUTEROL 0.5-2.5 (3) MG/3ML IN SOLN
3.0000 mL | RESPIRATORY_TRACT | Status: DC
  Administered 2017-11-09 – 2017-11-13 (×26): 3 mL via RESPIRATORY_TRACT
  Filled 2017-11-09 (×25): qty 3

## 2017-11-09 MED ORDER — HYDROMORPHONE HCL 2 MG PO TABS
2.0000 mg | ORAL_TABLET | Freq: Four times a day (QID) | ORAL | Status: DC
  Administered 2017-11-09 (×2): 2 mg via ORAL
  Filled 2017-11-09 (×2): qty 1

## 2017-11-09 MED ORDER — INSULIN ASPART 100 UNIT/ML ~~LOC~~ SOLN
0.0000 [IU] | SUBCUTANEOUS | Status: DC
  Administered 2017-11-09 – 2017-11-10 (×4): 3 [IU] via SUBCUTANEOUS
  Administered 2017-11-10: 2 [IU] via SUBCUTANEOUS
  Administered 2017-11-10: 3 [IU] via SUBCUTANEOUS
  Administered 2017-11-10: 5 [IU] via SUBCUTANEOUS
  Administered 2017-11-10: 3 [IU] via SUBCUTANEOUS
  Administered 2017-11-11: 5 [IU] via SUBCUTANEOUS
  Administered 2017-11-11: 8 [IU] via SUBCUTANEOUS
  Administered 2017-11-11 (×2): 5 [IU] via SUBCUTANEOUS
  Administered 2017-11-11: 2 [IU] via SUBCUTANEOUS
  Administered 2017-11-11 – 2017-11-12 (×4): 3 [IU] via SUBCUTANEOUS
  Administered 2017-11-12: 2 [IU] via SUBCUTANEOUS
  Administered 2017-11-13: 5 [IU] via SUBCUTANEOUS
  Administered 2017-11-13 (×3): 3 [IU] via SUBCUTANEOUS
  Administered 2017-11-14: 2 [IU] via SUBCUTANEOUS
  Administered 2017-11-14: 3 [IU] via SUBCUTANEOUS
  Administered 2017-11-14: 2 [IU] via SUBCUTANEOUS
  Administered 2017-11-15: 3 [IU] via SUBCUTANEOUS
  Administered 2017-11-15 (×2): 2 [IU] via SUBCUTANEOUS
  Administered 2017-11-16: 3 [IU] via SUBCUTANEOUS
  Administered 2017-11-16 (×2): 2 [IU] via SUBCUTANEOUS
  Administered 2017-11-16: 5 [IU] via SUBCUTANEOUS
  Administered 2017-11-17: 3 [IU] via SUBCUTANEOUS
  Administered 2017-11-17: 2 [IU] via SUBCUTANEOUS
  Administered 2017-11-17 – 2017-11-19 (×10): 3 [IU] via SUBCUTANEOUS
  Administered 2017-11-19: 2 [IU] via SUBCUTANEOUS
  Administered 2017-11-20 (×2): 1 [IU] via SUBCUTANEOUS
  Administered 2017-11-20 – 2017-11-21 (×4): 2 [IU] via SUBCUTANEOUS
  Administered 2017-11-22: 3 [IU] via SUBCUTANEOUS
  Administered 2017-11-22 (×4): 2 [IU] via SUBCUTANEOUS

## 2017-11-09 NOTE — Progress Notes (Signed)
Dr Denese KillingsAgarwala informed that patient still has not had any urine output

## 2017-11-09 NOTE — Progress Notes (Signed)
eLink Physician-Brief Progress Note Patient Name: [name] DOB: 01/12/1961 MRN: [id number]   Date of Service  11/09/2017  HPI/Events of Note  Agitation - Request to renew bilateral soft wrist restraints.   eICU Interventions  Will renew bilateral soft wrist restraints.      Intervention Category Minor Interventions: Agitation / anxiety - evaluation and management  Osric Klopf Eugene 11/09/2017, 10:13 PM

## 2017-11-09 NOTE — Progress Notes (Signed)
Initial Nutrition Assessment  DOCUMENTATION CODES:   Not applicable  INTERVENTION:    Vital AF 1.2 at 60 ml/h (1440 ml per day)  Pro-stat 30 ml BID  Provides 1928 kcal, 138 gm protein, 1168 ml free water daily  NUTRITION DIAGNOSIS:   Inadequate oral intake related to inability to eat as evidenced by NPO status.  GOAL:   Patient will meet greater than or equal to 90% of their needs  MONITOR:   Vent status, TF tolerance, Skin, Labs, I & O's  REASON FOR ASSESSMENT:   Ventilator, Consult Enteral/tube feeding initiation and management  ASSESSMENT:   57 yo male with PMH of GERD, osteoporosis, vitamin D deficiency, hepatomegaly, alcohol abuse, pancreatitis, DM, compression fracture, Legionnaire's DZ, PTSD who was admitted on 3/16 with decompensated cirrhosis with hepatic encephalopathy, hypoglycemia. Required intubation on 3/17.  Discussed patient in ICU rounds and with RN today. Received MD Consult for TF initiation and management. Patient is currently intubated on ventilator support MV: 8.5 L/min Temp (24hrs), Avg:97.6 F (36.4 C), Min:97 F (36.1 C), Max:98.8 F (37.1 C)  Propofol: none Labs reviewed. Sodium 131 (L) Medications reviewed and include folic acid, lactulose, thiamine, mag sulfate.   NUTRITION - FOCUSED PHYSICAL EXAM:    Most Recent Value  Orbital Region  No depletion  Upper Arm Region  No depletion  Thoracic and Lumbar Region  Unable to assess  Buccal Region  Unable to assess  Temple Region  No depletion  Clavicle Bone Region  No depletion  Clavicle and Acromion Bone Region  No depletion  Scapular Bone Region  Unable to assess  Dorsal Hand  Unable to assess  Patellar Region  No depletion  Anterior Thigh Region  No depletion  Posterior Calf Region  Unable to assess  Edema (RD Assessment)  Unable to assess  Hair  Reviewed  Eyes  Unable to assess  Mouth  Unable to assess  Skin  Reviewed  Nails  Unable to assess       Diet Order:  Diet  NPO time specified  EDUCATION NEEDS:   No education needs have been identified at this time  Skin:  Skin Assessment: Skin Integrity Issues: Skin Integrity Issues:: Other (Comment) Other: weeping blisters to bilateral LE  Last BM:  3/18  Height:   Ht Readings from Last 1 Encounters:  11/07/17 5\' 8"  (1.727 m)    Weight:   Wt Readings from Last 1 Encounters:  11/09/17 202 lb 2.6 oz (91.7 kg)   88.9 kg on admission (BMI=29.8)  Ideal Body Weight:  70 kg  BMI:  Body mass index is 30.74 kg/m.  Estimated Nutritional Needs:   Kcal:  1880  Protein:  130-140 gm  Fluid:  1.9 L    Joaquin CourtsKimberly Hildred Pharo, RD, LDN, CNSC Pager [phone] After Hours Pager [phone]

## 2017-11-09 NOTE — Progress Notes (Signed)
Patient has not had any urin output today .[name] SimmondsPete Babcock NP informed and patient bladder scanned  For 26 mls.

## 2017-11-09 NOTE — Progress Notes (Signed)
Pharmacy Antibiotic Note  [name] is a 57 y.o. male with cellulitis for Vancomycin.  Pt has developed ARF and is anuric today.  Vancomycin level tonight supratheraeputic  Plan: Hold Vancomycin for now.  Will F/U renal function and UOP and recheck level as indicated.    Height: 5\' 8"  (172.7 cm) Weight: 202 lb 2.6 oz (91.7 kg) IBW/kg (Calculated) : 68.4  Temp (24hrs), Avg:97.6 F (36.4 C), Min:97 F (36.1 C), Max:98.2 F (36.8 C)  Recent Labs  Lab 11/07/17 1306 11/07/17 1436 11/07/17 1622 11/07/17 1754 11/07/17 1839 11/07/17 1938 11/08/17 0318 11/09/17 0420 11/09/17 2222  WBC 10.6*  --   --  7.2  --   --  5.9 8.8  --   CREATININE 0.76  --   --  0.63  --   --  0.68 1.61*  --   LATICACIDVEN  --  3.76* 2.89*  --  2.3* 2.9* 2.6*  --   --   VANCOTROUGH  --   --   --   --   --   --   --   --  55*    Estimated Creatinine Clearance: 57 mL/min (A) (by C-G formula based on SCr of 1.61 mg/dL (H)).    Allergies  Allergen Reactions  . Temsirolimus Other (See Comments)    Unknown - Pt does not recognize this medication [facility]  . Ketorolac Tromethamine Hives    Antimicrobials this admission: CFTX 3/16>> Vanc 3/17>>   [name], [name] 11/09/2017 11:46 PM

## 2017-11-09 NOTE — Consult Note (Signed)
WOC Nurse wound consult note Reason for Consult: Consult requested for bilat legs. Wound type: Pt has generalized edema and erythremia and patchy areas of clear fluid filled blisters to bilat legs.  Right posterior knee area has partial thickness skin loss where previous blisters have ruptured and evolved into partial thickness wounds; area is approx 2X2X.1cm, pink and dry. Left leg with partial thickness wounds were previous blisters have ruptured; 4X4X.1cm,  2X2X.1cm and .5X.5X.1cm,  pink and moist.  Large amt yellow drainage to left leg, minimal to right leg. Dressing procedure/placement/frequency: xeroform gauze to promote drying and healing and ABD pads and kerlex to absorb drainage. No family present to discuss plan of care and pt is on the vent. Please re-consult if further assistance is needed.  Thank-you,  Cammie Mcgeeawn Himani Corona MSN, RN, CWOCN, MonroeWCN-AP, CNS [phone]

## 2017-11-09 NOTE — Progress Notes (Signed)
[facility] PROGRESS NOTE Subjective Patient still intubated and heavily sedated.  He is restrained.  Objective: Vital signs in last 24 hours: Temp:  [97 F (36.1 C)-99 F (37.2 C)] 97.3 F (36.3 C) (03/18 0800) Pulse Rate:  [82-103] 90 (03/18 0800) Resp:  [9-20] 15 (03/18 0800) BP: (76-152)/(56-118) 92/56 (03/18 0800) SpO2:  [96 %-100 %] 100 % (03/18 0800) FiO2 (%):  [40 %] 40 % (03/18 0743) Weight:  [91.7 kg (202 lb 2.6 oz)] 91.7 kg (202 lb 2.6 oz) (03/18 0500) Last BM Date: (P) 11/09/17  Intake/Output from previous day: 03/17 0701 - 03/18 0700 In: 1328.7 [I.V.:778.7; IV Piggyback:550] Out: 335 [Urine:135; Stool:200] Intake/Output this shift: Total I/O In: 60 [I.V.:60] Out: 5 [Urine:5]  PE: General--somewhat agitated, restraints in place  Lungs--coarse breath sounds Abdomen--slightly distended no fluid wave bowel sounds  Lab Results: Recent Labs    11/07/17 1306 11/07/17 1754 11/08/17 0318 11/09/17 0420  WBC 10.6* 7.2 5.9 8.8  HGB 10.8* 10.4* 10.6* 10.0*  HCT 31.7* 30.7* 31.9* 30.0*  PLT 117* 102* 82* 84*   BMET Recent Labs    11/07/17 1306 11/07/17 1754 11/08/17 0318 11/09/17 0420  NA 132* 131* 130* 131*  K 3.2* 2.8* 4.0 3.9  CL 98* 96* 98* 98*  CO2 20* 22 22 24   CREATININE 0.76 0.63 0.68 1.61*   LFT Recent Labs    11/07/17 1306 11/07/17 1754 11/09/17 0420  PROT 5.9* 5.6* 5.2*  AST 149* 140* 118*  ALT 55 53 44  ALKPHOS 366* 344* 321*  BILITOT 9.5* 8.9* 12.7*  BILIDIR  --   --  7.5*  IBILI  --   --  5.2*   PT/INR Recent Labs    11/07/17 1754 11/09/17 0420  LABPROT 21.6* 18.9*  INR 1.90 1.60   PANCREAS Recent Labs    11/07/17 1306 11/07/17 1754  LIPASE 16 17         Studies/Results: Dg Chest Port 1 View  Result Date: 11/08/2017 CLINICAL DATA:  Endotracheal and orogastric tube placement EXAM: PORTABLE CHEST 1 VIEW COMPARISON:  Chest radiograph 11/07/2017 FINDINGS: Endotracheal tube tip is just above the carina.  This should be retracted by 3-4 cm. The orogastric tube tip and side-port in the stomach. Unchanged cardiomediastinal contours. Pulmonary vascular congestion. IMPRESSION: Recommend retraction of endotracheal tube by 3-4 cm to place the tip at the level of the clavicular heads. Intragastric location of orogastric tube tip and side port. Electronically Signed   By: Deatra RobinsonKevin  Herman M.D.   On: 11/08/2017 01:52   Dg Chest Portable 1 View  Result Date: 11/07/2017 CLINICAL DATA:  57 year old male with history of lower extremity swelling bilaterally for the past 3 days. EXAM: PORTABLE CHEST 1 VIEW COMPARISON:  Chest x-ray 10/16/2017. FINDINGS: Lung volumes are normal. No consolidative airspace disease. No pleural effusions. No pneumothorax. No pulmonary nodule or mass noted. Pulmonary vasculature and the cardiomediastinal silhouette are within normal limits. Atherosclerosis in the thoracic aorta. Orthopedic fixation hardware in the sternum incidentally noted. Multiple old healed fractures of the ribs bilaterally. IMPRESSION: 1.  No radiographic evidence of acute cardiopulmonary disease. 2. Aortic atherosclerosis. 3. Additional incidental findings, as above. Electronically Signed   By: Trudie Reedaniel  Entrikin M.D.   On: 11/07/2017 15:18    Medications: I have reviewed the patient's current medications.  Assessment:   1.  Alcoholic liver disease.  MELD score 26 on admission.  LFTs have been remaining fairly stable.  Encouraging that pro time is not more elevated fact is improving  with IV vitamin K. 2.  Respiratory failure.  The cause of this is not entirely clear does not appear to be due to primary liver failure.  Patient was hospitalized about a month ago for pneumonia.  He could well have SBP contributing.  He is on very broad-spectrum antibiotics which should cover both his conditions. 3.  Hepatic encephalopathy.  The patient is receiving lactulose.  His ammonia was elevated.  He is so sedated is very difficult to  determine if this is   Plan: Continue to treat the respiratory failure.  We will continue him on vitamin K, lactulose.  His liver function appears to be relatively stable at the current time and hopefully will improve as his overall condition.  Check a pro time daily   Tresea Mall 11/09/2017, 8:34 AM  This note was created using voice recognition software. Minor errors may Have occurred unintentionally.  Pager: [phone] If no answer or after hours call [phone]

## 2017-11-09 NOTE — Progress Notes (Signed)
eLink Physician-Brief Progress Note Patient Name: [name] DOB: 09/19/1960 MRN: [id number]   Date of Service  11/09/2017  HPI/Events of Note  Request to renew bilateral soft wrist restraints.   eICU Interventions  Will renew bilateral soft wrist restraints.      Intervention Category Major Interventions: Other:  [name],[name] 11/09/2017, 12:42 AM

## 2017-11-10 LAB — CBC WITH DIFFERENTIAL/PLATELET
BASOS ABS: 0 10*3/uL (ref 0.0–0.1)
Basophils Relative: 0 %
EOS PCT: 1 %
Eosinophils Absolute: 0.1 10*3/uL (ref 0.0–0.7)
HEMATOCRIT: 32.8 % — AB (ref 39.0–52.0)
Hemoglobin: 10.6 g/dL — ABNORMAL LOW (ref 13.0–17.0)
Lymphocytes Relative: 10 %
Lymphs Abs: 1.4 10*3/uL (ref 0.7–4.0)
MCH: 36.4 pg — ABNORMAL HIGH (ref 26.0–34.0)
MCHC: 32.3 g/dL (ref 30.0–36.0)
MCV: 112.7 fL — ABNORMAL HIGH (ref 78.0–100.0)
MONO ABS: 1.5 10*3/uL — AB (ref 0.1–1.0)
Monocytes Relative: 11 %
NEUTROS PCT: 78 %
Neutro Abs: 10.8 10*3/uL — ABNORMAL HIGH (ref 1.7–7.7)
PLATELETS: 101 10*3/uL — AB (ref 150–400)
RBC: 2.91 MIL/uL — AB (ref 4.22–5.81)
RDW: 19.1 % — AB (ref 11.5–15.5)
WBC: 13.8 10*3/uL — ABNORMAL HIGH (ref 4.0–10.5)

## 2017-11-10 LAB — GLUCOSE, CAPILLARY
GLUCOSE-CAPILLARY: 185 mg/dL — AB (ref 65–99)
Glucose-Capillary: 142 mg/dL — ABNORMAL HIGH (ref 65–99)
Glucose-Capillary: 156 mg/dL — ABNORMAL HIGH (ref 65–99)
Glucose-Capillary: 190 mg/dL — ABNORMAL HIGH (ref 65–99)
Glucose-Capillary: 216 mg/dL — ABNORMAL HIGH (ref 65–99)

## 2017-11-10 LAB — BASIC METABOLIC PANEL
ANION GAP: 12 (ref 5–15)
BUN: 15 mg/dL (ref 6–20)
CALCIUM: 7.1 mg/dL — AB (ref 8.9–10.3)
CO2: 22 mmol/L (ref 22–32)
CREATININE: 2.71 mg/dL — AB (ref 0.61–1.24)
Chloride: 99 mmol/L — ABNORMAL LOW (ref 101–111)
GFR, EST AFRICAN AMERICAN: 29 mL/min — AB (ref >60)
GFR, EST NON AFRICAN AMERICAN: 25 mL/min — AB (ref >60)
Glucose, Bld: 136 mg/dL — ABNORMAL HIGH (ref 65–99)
Potassium: 3.8 mmol/L (ref 3.5–5.1)
Sodium: 133 mmol/L — ABNORMAL LOW (ref 135–145)

## 2017-11-10 LAB — CULTURE, RESPIRATORY: CULTURE: NORMAL

## 2017-11-10 LAB — TYPE AND SCREEN
ABO/RH(D): O POS
ANTIBODY SCREEN: NEGATIVE

## 2017-11-10 LAB — AMMONIA: Ammonia: 136 umol/L — ABNORMAL HIGH (ref 9–35)

## 2017-11-10 LAB — PROCALCITONIN: PROCALCITONIN: 2.96 ng/mL

## 2017-11-10 LAB — MAGNESIUM
MAGNESIUM: 2.4 mg/dL (ref 1.7–2.4)
MAGNESIUM: 2.2 mg/dL (ref 1.7–2.4)

## 2017-11-10 LAB — CULTURE, RESPIRATORY W GRAM STAIN

## 2017-11-10 LAB — PHOSPHORUS
Phosphorus: 5.2 mg/dL — ABNORMAL HIGH (ref 2.5–4.6)
Phosphorus: 4.5 mg/dL (ref 2.5–4.6)

## 2017-11-10 MED ORDER — VITAMIN B-1 100 MG PO TABS
100.0000 mg | ORAL_TABLET | Freq: Every day | ORAL | Status: DC
  Administered 2017-11-11 – 2017-11-12 (×2): 100 mg
  Filled 2017-11-10 (×3): qty 1

## 2017-11-10 MED ORDER — ALBUMIN HUMAN 25 % IV SOLN
12.5000 g | Freq: Four times a day (QID) | INTRAVENOUS | Status: AC
  Administered 2017-11-10 – 2017-11-11 (×4): 12.5 g via INTRAVENOUS
  Filled 2017-11-10 (×6): qty 50

## 2017-11-10 MED ORDER — FOLIC ACID 1 MG PO TABS
1.0000 mg | ORAL_TABLET | Freq: Every day | ORAL | Status: DC
  Administered 2017-11-11 – 2017-11-12 (×2): 1 mg
  Filled 2017-11-10 (×2): qty 1

## 2017-11-10 MED ORDER — PANTOPRAZOLE SODIUM 40 MG PO PACK
40.0000 mg | PACK | Freq: Every day | ORAL | Status: DC
  Administered 2017-11-10: 40 mg
  Filled 2017-11-10: qty 20

## 2017-11-10 MED ORDER — QUETIAPINE FUMARATE 25 MG PO TABS
25.0000 mg | ORAL_TABLET | Freq: Every day | ORAL | Status: DC
  Administered 2017-11-10 – 2017-11-16 (×6): 25 mg via ORAL
  Filled 2017-11-10 (×7): qty 1

## 2017-11-10 MED ORDER — FUROSEMIDE 10 MG/ML IJ SOLN
60.0000 mg | Freq: Two times a day (BID) | INTRAMUSCULAR | Status: DC
  Administered 2017-11-10: 60 mg via INTRAVENOUS
  Filled 2017-11-10 (×2): qty 6

## 2017-11-10 NOTE — Progress Notes (Signed)
200 mls Fentanyl wasted and witnessed by Meredith StaggersLinda Bass RN

## 2017-11-10 NOTE — Progress Notes (Signed)
Critical care attending progress note:  Reason for admission:  Decompensated cirrhosis with hepatic encephalopathy   Past medical history: Alcohol abuse, PTSD, chronic pain.   Admission history and course in hospital:  Increasing abdominal girth, jaundice and confusion.  Significant 24-hour events:  Intubated for airway protection and increase work of breathing. Hypoglycemic episodes.  On rounds today:  Principal condition limiting discharge: ongoing encephalopathy.  Principal safety issue: self extubation.  CNS:  Sedation vacation with dose reduction as appropriate:  Calm off fentanyl infusion now that receiving oral pain medications.  More agitated at nighttime. Moves all limbs, no asterixis. Ammonia continues to drop slowly. - continue current comfort strategy. - Seroquel at hs  Respiratory:  VAP bundle: yes Appropriate for SBT: no due to sedation. Improved compliance with ventilation but was not able to tolerate SBT due to hypoventilation.. - As fentanyl clears out respiratory drive should improve. - SBT in am.   Cardiovascular:  Secondary prevention CAD: n/a Line removal: PIVx 2. BP improved. HS normal with warm extremities. Marland Kitchen.  GI/nutrition:  Stress ulcer prophylaxis: Protonix. INR: 1.9 vit K given. PLT: 101.  Albumin 1.6, ammonia 136. Laxative regimen: lactulose q8h.  Enteral nutrition: needs EN. Abdomen protruberant but BS +  Renal:  Foley catheter removal:  Needed for close U/o monitoring.  Anasarca.  Hyponatremia due to edema/cirrhosis. Cr has increased to 2.71 with poor urine output. - Possible hepatorenal syndrome.  Albumin and furosemide to mobilize 3rd spaced fluids.   Renal dose adjustment: vancomycin   Hematology:   DVT prophylaxis: UFH. Plt: 101 Transfusion indications: HB 10.  Infectious diseases:  Antibiotic de-escalation: On vancomycin and rocephin for cellulitis. No leukocytosis, but PCT has climbed since admission. - complete 7  days of ceftriaxone for possible SBT  Endocrine:  Glycemic target: 70-180. Well controlled. - Start moderate SSI to cover tube feeds. Hold long acting while Cr remains elevated.  Disposition: continue to wean sedation and move to extubation as tolerated.  Patient remains critically ill due to respiratory and hepatic failure requiring mechanical ventilation and titration of sedation..  They remain at high risk for life/limb threatening clinical deterioration requiring frequent reassessment and modifications of care.  Services provided include examination of the patient, review of relevant ancillary tests, prescription of lifesaving therapies, review of medications and prophylactic therapy and multidisciplinary rounding.  Total critical care time excluding separately billable procedures: 45  Minutes.  Lynnell Catalanavi Comfort Iversen, MD Critical Care Attending.

## 2017-11-10 NOTE — Progress Notes (Deleted)
Critical care attending progress note:  Reason for admission:  Decompensated cirrhosis with hepatic encephalopathy   Past medical history: Alcohol abuse, PTSD, chronic pain.   Admission history and course in hospital:  Increasing abdominal girth, jaundice and confusion.  Significant 24-hour events:  Intubated for airway protection and increase work of breathing. Hypoglycemic episodes.  On rounds today:  Principal condition limiting discharge: ongoing encephalopathy.  Principal safety issue: self extubation.  CNS:  Sedation vacation with dose reduction as appropriate: on fentanyl infusion with versed prn. Difficult to wean as becomes rapidly agitated and dyssynchronous with ventilator. On chronic opioids. Will orient to voice and follow commands in all four limbs, generalized weakness.  - start oral narcotics to limit infusion dose. - continue prn BDZ and consider adding enteral dose.   Respiratory:  VAP bundle: yes Appropriate for SBT: no due to sedation. Dyssynchronous with breath stacking, high peak pressure. ++ wheezing through (low pitch). - Start bronchodilators. - Attempt PSV ventilation  Cardiovascular:  Secondary prevention CAD: n/a Line removal: PIVx 2. BP soft with systolic 90's MAP: 77  - BP  Should improve with sedation reduction and improved airway obstruction.  GI/nutrition:  Stress ulcer prophylaxis: Protonix. INR: 1.9 vit K given. PLT: 84.  Albumin 1.6, ammonia 140. Laxative regimen: lactulose q8h.  Enteral nutrition: needs EN. Abdomen protruberant but BS +  Renal:  Foley catheter removal:  Needed for close U/o monitoring.  Anasarca.  Hyponatremia due to edema/cirrhosis. Cr has increased. Lasix/spironolactone stopped. Hypomagnesemia - 2 g given.  Renal dose adjustment: vancomycin   Hematology:   DVT prophylaxis: UFH. Plt: 84 Transfusion indications: HB 10.  Infectious diseases:  Antibiotic de-escalation: On vancomycin and rocephin for  cellulitis. No leukocytosis. - D/c antibiotics if PCT negative.  Endocrine:  Glycemic target: 70-180. Well controlled. - Start moderate SSI to cover tube feeds. Hold long acting while Cr remains elevated.  Disposition: continue to wean sedation and move to extubation as tolerated.  Patient remains critically ill due to respiratory and hepatic failure requiring mechanical ventilation and titration of sedation..  They remain at high risk for life/limb threatening clinical deterioration requiring frequent reassessment and modifications of care.  Services provided include examination of the patient, review of relevant ancillary tests, prescription of lifesaving therapies, review of medications and prophylactic therapy and multidisciplinary rounding.  Total critical care time excluding separately billable procedures: 45  Minutes.  Abdias Hickam, MD Critical Care Attending.  

## 2017-11-11 ENCOUNTER — Inpatient Hospital Stay (HOSPITAL_COMMUNITY): Payer: Medicare HMO

## 2017-11-11 DIAGNOSIS — N179 Acute kidney failure, unspecified: Secondary | ICD-10-CM

## 2017-11-11 LAB — COMPREHENSIVE METABOLIC PANEL
ALBUMIN: 1.9 g/dL — AB (ref 3.5–5.0)
ALK PHOS: 324 U/L — AB (ref 38–126)
ALT: 36 U/L (ref 17–63)
AST: 81 U/L — AB (ref 15–41)
Anion gap: 13 (ref 5–15)
BUN: 21 mg/dL — ABNORMAL HIGH (ref 6–20)
CALCIUM: 7.6 mg/dL — AB (ref 8.9–10.3)
CO2: 22 mmol/L (ref 22–32)
Chloride: 102 mmol/L (ref 101–111)
Creatinine, Ser: 3.5 mg/dL — ABNORMAL HIGH (ref 0.61–1.24)
GFR calc Af Amer: 21 mL/min — ABNORMAL LOW (ref >60)
GFR calc non Af Amer: 18 mL/min — ABNORMAL LOW (ref >60)
GLUCOSE: 185 mg/dL — AB (ref 65–99)
Potassium: 3.6 mmol/L (ref 3.5–5.1)
SODIUM: 137 mmol/L (ref 135–145)
Total Bilirubin: 12.7 mg/dL — ABNORMAL HIGH (ref 0.3–1.2)
Total Protein: 5.8 g/dL — ABNORMAL LOW (ref 6.5–8.1)

## 2017-11-11 LAB — URINALYSIS, ROUTINE W REFLEX MICROSCOPIC
Glucose, UA: NEGATIVE mg/dL
Ketones, ur: NEGATIVE mg/dL
Nitrite: NEGATIVE
Protein, ur: >300 mg/dL — AB
Specific Gravity, Urine: 1.025 (ref 1.005–1.030)
pH: 6.5 (ref 5.0–8.0)

## 2017-11-11 LAB — CBC WITH DIFFERENTIAL/PLATELET
BASOS PCT: 1 %
Basophils Absolute: 0.2 10*3/uL — ABNORMAL HIGH (ref 0.0–0.1)
EOS ABS: 0.2 10*3/uL (ref 0.0–0.7)
Eosinophils Relative: 1 %
HCT: 32.8 % — ABNORMAL LOW (ref 39.0–52.0)
HEMOGLOBIN: 10.3 g/dL — AB (ref 13.0–17.0)
Lymphocytes Relative: 8 %
Lymphs Abs: 1.2 10*3/uL (ref 0.7–4.0)
MCH: 35.9 pg — AB (ref 26.0–34.0)
MCHC: 31.4 g/dL (ref 30.0–36.0)
MCV: 114.3 fL — AB (ref 78.0–100.0)
MONOS PCT: 12 %
Monocytes Absolute: 1.8 10*3/uL — ABNORMAL HIGH (ref 0.1–1.0)
NEUTROS ABS: 11.8 10*3/uL — AB (ref 1.7–7.7)
Neutrophils Relative %: 78 %
Platelets: 92 10*3/uL — ABNORMAL LOW (ref 150–400)
RBC: 2.87 MIL/uL — ABNORMAL LOW (ref 4.22–5.81)
RDW: 19.2 % — ABNORMAL HIGH (ref 11.5–15.5)
WBC: 15.2 10*3/uL — ABNORMAL HIGH (ref 4.0–10.5)

## 2017-11-11 LAB — GLUCOSE, CAPILLARY
GLUCOSE-CAPILLARY: 133 mg/dL — AB (ref 65–99)
GLUCOSE-CAPILLARY: 245 mg/dL — AB (ref 65–99)
Glucose-Capillary: 265 mg/dL — ABNORMAL HIGH (ref 65–99)
Glucose-Capillary: 225 mg/dL — ABNORMAL HIGH (ref 65–99)
Glucose-Capillary: 220 mg/dL — ABNORMAL HIGH (ref 65–99)
Glucose-Capillary: 164 mg/dL — ABNORMAL HIGH (ref 65–99)
Glucose-Capillary: 127 mg/dL — ABNORMAL HIGH (ref 65–99)

## 2017-11-11 LAB — URINALYSIS, MICROSCOPIC (REFLEX)

## 2017-11-11 LAB — NA AND K (SODIUM & POTASSIUM), RAND UR
Potassium Urine: 26 mmol/L
Sodium, Ur: 91 mmol/L

## 2017-11-11 MED ORDER — RIFAXIMIN 550 MG PO TABS
550.0000 mg | ORAL_TABLET | Freq: Two times a day (BID) | ORAL | Status: DC
  Administered 2017-11-11 – 2017-11-16 (×10): 550 mg via ORAL
  Filled 2017-11-11 (×13): qty 1

## 2017-11-11 MED ORDER — HYDROMORPHONE HCL 2 MG PO TABS
1.0000 mg | ORAL_TABLET | ORAL | Status: DC | PRN
  Administered 2017-11-12 – 2017-11-13 (×3): 1 mg via ORAL
  Filled 2017-11-11 (×3): qty 1

## 2017-11-11 MED ORDER — FUROSEMIDE 10 MG/ML IJ SOLN
100.0000 mg | Freq: Once | INTRAVENOUS | Status: AC
  Administered 2017-11-11: 100 mg via INTRAVENOUS
  Filled 2017-11-11: qty 10

## 2017-11-11 MED ORDER — LACTULOSE 10 GM/15ML PO SOLN
10.0000 g | Freq: Three times a day (TID) | ORAL | Status: DC
  Administered 2017-11-11 – 2017-11-13 (×7): 10 g via ORAL
  Filled 2017-11-11 (×7): qty 15

## 2017-11-11 NOTE — Progress Notes (Signed)
Critical care attending progress note:  Reason for admission:  Decompensated cirrhosis with hepatic encephalopathy  Past medical history: Alcohol abuse, PTSD, chronic pain.  Admission history and course in hospital:  Increasing abdominal girth, jaundice and confusion.Intubated for airway protection and increase work of breathing. Hypoglycemic episodes.  Treated for suspected SBP versus bilateral cellulitis.  Transitioned to oral sedative medication to provide baseline coverage as patient is on chronic opiates at home.  Significant 24-hour events:  Improving mental status.  Worsening renal function.  On rounds today:  Principal condition limiting discharge: Recent extubation.  Principal safety issue: Worsening renal function with volume overload  CNS:  Patient is off all sedative infusions.  He was increasingly anxious and agitated on mechanical ventilation but is now calm since extubation.  He follows commands appropriately although remains somnolent. -Continue lactulose for hepatic encephalopathy. -Now that discomfort from endotracheal tube has been removed the patient may not require quite as much sedation.  Doses will be decreased.  Respiratory:  Successfully extubated this afternoon following 4-hour trial of spontaneous breathing.  Rhonchi are much improved. -Continue as needed bronchodilators. -Continue to wean oxygen therapy as tolerated. -Progressive ambulation.  Cardiovascular:  Secondary prevention CAD: n/a Line removal: PIVx 2. BP improved. HS normal with warm extremities.  Marked edema.  GI/nutrition:  Stress ulcer prophylaxis: No longer indicated  INR: 1.6 vit K given. PLT: 92.  Albumin 1.6, ammonia 136.  Marked jaundice  Laxative regimen: lactulose decreased to 10 3 times daily given diarrhea. Enteral nutrition: Have been tolerating enteral nutrition.  Will progress to oral intake post extubation On examination abdomen is protuberant but soft and  nontender.  Renal:  Foley catheter removal:  Needed for close U/o monitoring.  Anasarca.  Hyponatremia due to edema/cirrhosis. Cr has increased to 3.5 with poor urine output despite albumin infusion, trial of high-dose Lasix and adequate MAP which has been consistently greater than 80.  Urinalysis shows significant pyuria.  Renal ultrasound normal - Possible hepatorenal syndrome.  Nephrology consult placed.  Currently no indications for dialysis as electrolytes remain normal  Renal dose adjustment: None, vancomycin has been discontinued.  Hematology:   DVT prophylaxis: UFH. Plt: 92 Transfusion indications: HB stable at 10.3  Infectious diseases:  Antibiotic de-escalation: On vancomycin and rocephin for cellulitis. No leukocytosis, but PCT has climbed since admission. - complete 7 days of ceftriaxone for possible SBT.  Erythema on both legs most consistent with venous stasis rather than cellulitis.  Endocrine:  Glycemic target: Hyperglycemic with blood sugars in the 200s while on tube feeds.  Now the patient is extubated we will continue just with sliding scale until we have a better sense of his oral intake.  Disposition: Doing well post extubation we will begin progressive ambulation and continue to treat hepatic encephalopathy.  Unfortunately patient appears to have developed acute kidney injury likely due to hepatorenal syndrome.  The patient has a dirty urine, but no evidence of hydronephrosis to suggest pyelonephritis.  Will hold off on further antibiotics at this time.  Patient remains critically ill due to respiratory and hepatic failure requiring mechanical ventilation and titration of sedation..  They remain at high risk for life/limb threatening clinical deterioration requiring frequent reassessment and modifications of care.  Services provided include examination of the patient, review of relevant ancillary tests, prescription of lifesaving therapies, review of medications and  prophylactic therapy and multidisciplinary rounding.  Total critical care time excluding separately billable procedures: 45  Minutes.  Lynnell Catalanavi Nya Monds, MD Critical Care Attending.

## 2017-11-11 NOTE — Progress Notes (Signed)
Inpatient Diabetes Program Recommendations  AACE/ADA: New Consensus Statement on Inpatient Glycemic Control (2015)  Target Ranges:  Prepandial:   less than 140 mg/dL      Peak postprandial:   less than 180 mg/dL (1-2 hours)      Critically ill patients:  140 - 180 mg/dL   Lab Results  Component Value Date   GLUCAP 225 (H) 11/11/2017   HGBA1C 7.6 (H) 04/09/2013    Review of Glycemic Control  Diabetes history: DM2 Outpatient Diabetes medications: Lantus 37 units QD, Novolog 9-17 units QID Current orders for Inpatient glycemic control: Novolog 0-15 units Q4H Blood sugars 3/20 - 225, 245, 265   Inpatient Diabetes Program Recommendations:     ICU Glycemic Control Orders  Continue to follow.  Thank you. Ailene Ardshonda Brittanny Levenhagen, RD, LDN, CDE Inpatient Diabetes Coordinator [phone]

## 2017-11-11 NOTE — Consult Note (Signed)
[name] is a 57 year old male who has chronic back pain compression fractures with chronic pain followed at the [place], and diabetes mellitus.  He does have  posttraumatic stress disorder. PTA he reportedly drank approximately 2 bottles of wine daily.  Family reported his abdomen has been distended for a while, but acutely so over the last 3-4 weeks. He was recently hospitalized in Feb at [facility] with the Flu.  Family noticed that he became jaundice, somnolent, with increased lethargy and brought him to the emergency room to be evaluated.   He presented to the ED and was intubated short term and has had some labile BPs. There apparently was some abd discomfort.  On 3/17 BP was down to 81/63, 3/18 77/57, 3/19 98/66, and today 96/61. He has developed oliguric AKI and renal was asked to see.  He was taking NSAIDs at home and high dose gabapentin. He carries a diagnosis of hypertension.   On 3/16 creat was 0.76,  3/17 0.68, 3/18 1.61, 3/19 2.71 and 3/20 3.'5mg'$ /dl.  Past Medical History:  Diagnosis Date  . Chronic lower back pain   . Compression fracture    lumbar 3  . Diabetes mellitus   . Fracture acetabulum-closed ([place]) 04/09/2013  . GERD (gastroesophageal reflux disease)   . H/O Legionnaire's disease   . Hepatomegaly   . Migraine   . Osteoporosis   . Pancreatitis   . PTSD (post-traumatic stress disorder)   . Vitamin D deficiency    Past Surgical History:  Procedure Laterality Date  . BACK SURGERY    . CHOLECYSTECTOMY    . HERNIA REPAIR    . KYPHOPLASTY N/A 07/16/2015   Procedure: Lumbar three kyphoplasty;  Surgeon: Ashok Pall, MD;  Location: [place] [facility];  Service: Neurosurgery;  Laterality: N/A;  Lumbar three kyphoplasty  . LAMINECTOMY    . MASS EXCISION Left 06/12/2017   Procedure: EXCISION LEFT AXILLARY SEBACEOUS CYST;  Surgeon: Coralie Keens, MD;  Location: [place];  Service: General;  Laterality: Left;  . STERNUM FRACTURE SURGERY     Social History:  reports  that he quit smoking about 10 years ago. His smoking use included cigarettes. He has a 13.00 pack-year smoking history. he has never used smokeless tobacco. He reports that he drinks about 3.6 oz of alcohol per week. He reports that he does not use drugs. Allergies:  Allergies  Allergen Reactions  . Temsirolimus Other (See Comments)    Unknown - Pt does not recognize this medication [facility]  . Ketorolac Tromethamine Hives   Family History  Problem Relation Age of Onset  . Cancer Unknown        breast/grandmother, prostate/grandfather    Medications:  Prior to Admission:  Medications Prior to Admission  Medication Sig Dispense Refill Last Dose  . albuterol (PROVENTIL HFA;VENTOLIN HFA) 108 (90 Base) MCG/ACT inhaler Inhale 2 puffs into the lungs every 6 (six) hours as needed for wheezing or shortness of breath. 1 Inhaler 6  at unk  . carboxymethylcellulose (REFRESH PLUS) 0.5 % SOLN Place 1 drop into both eyes 3 (three) times daily as needed (dry eyes).    at unk  . gabapentin (NEURONTIN) 400 MG capsule Take 3 capsules (1,200 mg total) by mouth 3 (three) times daily. 270 capsule 0 11/07/2017 at Unknown time  . guaiFENesin (MUCINEX) 600 MG 12 hr tablet Take 2 tablets (1,200 mg total) by mouth 2 (two) times daily. 30 tablet 0 11/07/2017 at Unknown time  . hydrochlorothiazide (HYDRODIURIL) 25 MG  tablet Take 0.5 tablets (12.5 mg total) by mouth 2 (two) times daily. 180 tablet 1 11/07/2017 at Unknown time  . ibuprofen (ADVIL,MOTRIN) 200 MG tablet Take 600 mg by mouth every 6 (six) hours as needed (back pain).    at unk  . insulin aspart (NOVOLOG FLEXPEN) 100 UNIT/ML FlexPen INJECT 7 TO 15 UNITS subcu up to 4 times daily (Patient taking differently: INJECT 9 - 17 UNITS SQ UP TO FOUR TIMES A DAY) 45 mL 0 11/06/2017 at Unknown time  . Insulin Glargine (LANTUS SOLOSTAR) 100 UNIT/ML Solostar Pen Inject 37 Units into the skin daily. (Patient taking differently: Inject 33 Units into the skin daily.  ) 16 mL 1 11/07/2017 at 0800  . Magnesium 500 MG TABS Take 500 mg by mouth daily.   11/07/2017 at Unknown time  . methocarbamol (ROBAXIN) 500 MG tablet Take 500 mg by mouth 2 (two) times daily as needed for muscle spasms.    at unk  . morphine (MSIR) 15 MG tablet Take 15 mg by mouth 3 (three) times daily.    11/07/2017 at Unknown time  . pantoprazole (PROTONIX) 40 MG tablet Take 1 tablet (40 mg total) by mouth daily. 90 tablet 3 11/07/2017 at Unknown time  . polyethylene glycol (MIRALAX / GLYCOLAX) packet Take 17 g by mouth 2 (two) times daily. 14 each 0 11/07/2017 at Unknown time  . POTASSIUM PO Take 1 tablet by mouth at bedtime as needed (leg cramps).    at unk  . Blood Glucose Monitoring Suppl (ACCU-CHEK AVIVA PLUS) w/Device KIT 1 each by Does not apply route daily. 1 kit 0   . Glucose Blood (BLOOD GLUCOSE TEST STRIPS) STRP 1 strip by In Vitro route 4 (four) times daily. 180 each 11    Scheduled: . folic acid  1 mg Per Tube Daily  . heparin injection (subcutaneous)  5,000 Units Subcutaneous Q8H  . insulin aspart  0-15 Units Subcutaneous Q4H  . ipratropium-albuterol  3 mL Nebulization Q4H  . lactulose  10 g Oral TID  . QUEtiapine  25 mg Oral QHS  . thiamine  100 mg Per Tube Daily   Continuous: . sodium chloride Stopped (11/10/17 0600)  . cefTRIAXone (ROCEPHIN)  IV Stopped (11/10/17 1803)     ROS: unobtainable Blood pressure 122/80, pulse (!) 101, temperature 98.8 F (37.1 C), resp. rate 14, height '5\' 8"'$  (1.727 m), weight 94 kg (207 lb 3.7 oz), SpO2 94 %.  General appearance: slowed mentation and answers very slow but appropriate Head: Normocephalic, without obvious abnormality, atraumatic Eyes: icteric Ears: r Nose: Nares normal. Septum midline. Mucosa normal. No drainage or sinus tenderness. Throat: lips, mucosa, and tongue normal; teeth and gums normal Resp: clear to auscultation bilaterally Chest wall: no tenderness Cardio: regular rate and rhythm, S1, S2 normal, no murmur,  click, rub or gallop GI: protuberant, enlarged firm liver Extremities: edema 1+ bilat Skin: Skin color, texture, turgor normal. No rashes or lesions Neurologic: lethargic  Results for orders placed or performed during the hospital encounter of 11/07/17 (from the past 48 hour(s))  Magnesium     Status: None   Collection Time: 11/09/17  5:39 PM  Result Value Ref Range   Magnesium 2.2 1.7 - 2.4 mg/dL    Comment: Performed at [facility], [place] [address], [place], [place] [address]  Phosphorus     Status: None   Collection Time: 11/09/17  5:39 PM  Result Value Ref Range   Phosphorus 4.5 2.5 - 4.6 mg/dL  Comment: Performed at [facility], [place] [address], [place], [place] [address]  Glucose, capillary     Status: Abnormal   Collection Time: 11/09/17  7:30 PM  Result Value Ref Range   Glucose-Capillary 182 (H) 65 - 99 mg/dL   Comment 1 Capillary Specimen    Comment 2 Notify RN   Vancomycin, trough     Status: Abnormal   Collection Time: 11/09/17 10:22 PM  Result Value Ref Range   Vancomycin Tr 55 (HH) 15 - 20 ug/mL    Comment: CRITICAL RESULT CALLED TO, READ BACK BY AND VERIFIED WITH: WHITE S,RN 11/09/17 2338 WAYK Performed at [facility], [place] [address], [place], [place] [address]   Glucose, capillary     Status: Abnormal   Collection Time: 11/09/17 11:11 PM  Result Value Ref Range   Glucose-Capillary 169 (H) 65 - 99 mg/dL   Comment 1 Capillary Specimen    Comment 2 Notify RN   Glucose, capillary     Status: Abnormal   Collection Time: 11/10/17  3:42 AM  Result Value Ref Range   Glucose-Capillary 142 (H) 65 - 99 mg/dL   Comment 1 Capillary Specimen    Comment 2 Notify RN   CBC with Differential/Platelet     Status: Abnormal   Collection Time: 11/10/17  4:58 AM  Result Value Ref Range   WBC 13.8 (H) 4.0 - 10.5 K/uL   RBC 2.91 (L) 4.22 - 5.81 MIL/uL   Hemoglobin 10.6 (L) 13.0 - 17.0 g/dL   HCT 32.8 (L) 39.0 - 52.0 %   MCV 112.7 (H) 78.0 - 100.0 fL    MCH 36.4 (H) 26.0 - 34.0 pg   MCHC 32.3 30.0 - 36.0 g/dL   RDW 19.1 (H) 11.5 - 15.5 %   Platelets 101 (L) 150 - 400 K/uL    Comment: CONSISTENT WITH PREVIOUS RESULT   Neutrophils Relative % 78 %   Lymphocytes Relative 10 %   Monocytes Relative 11 %   Eosinophils Relative 1 %   Basophils Relative 0 %   Neutro Abs 10.8 (H) 1.7 - 7.7 K/uL   Lymphs Abs 1.4 0.7 - 4.0 K/uL   Monocytes Absolute 1.5 (H) 0.1 - 1.0 K/uL   Eosinophils Absolute 0.1 0.0 - 0.7 K/uL   Basophils Absolute 0.0 0.0 - 0.1 K/uL   RBC Morphology POLYCHROMASIA PRESENT     Comment: TARGET CELLS STOMATOCYTES    WBC Morphology MILD LEFT SHIFT (1-5% METAS, OCC MYELO, OCC BANDS)     Comment: Performed at [facility], [place] [address] [place], [place] [address]  Basic metabolic panel     Status: Abnormal   Collection Time: 11/10/17  4:58 AM  Result Value Ref Range   Sodium 133 (L) 135 - 145 mmol/L   Potassium 3.8 3.5 - 5.1 mmol/L   Chloride 99 (L) 101 - 111 mmol/L   CO2 22 22 - 32 mmol/L   Glucose, Bld 136 (H) 65 - 99 mg/dL   BUN 15 6 - 20 mg/dL   Creatinine, Ser 2.71 (H) 0.61 - 1.24 mg/dL    Comment: DELTA CHECK NOTED   Calcium 7.1 (L) 8.9 - 10.3 mg/dL   GFR calc non Af Amer 25 (L) >60 mL/min   GFR calc Af Amer 29 (L) >60 mL/min    Comment: (NOTE) The eGFR has been calculated using the CKD EPI equation. This calculation has not been validated in all clinical situations. eGFR's persistently <60 mL/min signify  possible Chronic Kidney Disease.    Anion gap 12 5 - 15    Comment: Performed at [facility] [address], [place], [place] [address]  Ammonia     Status: Abnormal   Collection Time: 11/10/17  4:58 AM  Result Value Ref Range   Ammonia 136 (H) 9 - 35 umol/L    Comment: Performed at [facility], [place] [address] [place], [place] [address]  Procalcitonin     Status: None   Collection Time: 11/10/17  4:58 AM  Result Value Ref Range   Procalcitonin 2.96 ng/mL    Comment:         Interpretation: PCT > 2 ng/mL: Systemic infection (sepsis) is likely, unless other causes are known. (NOTE)       Sepsis PCT Algorithm           Lower Respiratory Tract                                      Infection PCT Algorithm    ----------------------------     ----------------------------         PCT < 0.25 ng/mL                PCT < 0.10 ng/mL         Strongly encourage             Strongly discourage   discontinuation of antibiotics    initiation of antibiotics    ----------------------------     -----------------------------       PCT 0.25 - 0.50 ng/mL            PCT 0.10 - 0.25 ng/mL               OR       >80% decrease in PCT            Discourage initiation of                                            antibiotics      Encourage discontinuation           of antibiotics    ----------------------------     -----------------------------         PCT >= 0.50 ng/mL              PCT 0.26 - 0.50 ng/mL               AND       <80% decrease in PCT              Encourage initiation of                                             antibiotics       Encourage continuation           of antibiotics    ----------------------------     -----------------------------        PCT >= 0.50 ng/mL                  PCT > 0.50 ng/mL  AND         increase in PCT                  Strongly encourage                                      initiation of antibiotics    Strongly encourage escalation           of antibiotics                                     -----------------------------                                           PCT <= 0.25 ng/mL                                                 OR                                        > 80% decrease in PCT                                     Discontinue / Do not initiate                                             antibiotics Performed at [facility], [address], [place], [place] [address]   Magnesium     Status: None    Collection Time: 11/10/17  4:58 AM  Result Value Ref Range   Magnesium 2.4 1.7 - 2.4 mg/dL    Comment: Performed at [facility] [address], [place], [place] [address]  Phosphorus     Status: Abnormal   Collection Time: 11/10/17  4:58 AM  Result Value Ref Range   Phosphorus 5.2 (H) 2.5 - 4.6 mg/dL    Comment: Performed at [facility] [address], [place],  [address]  Glucose, capillary     Status: Abnormal   Collection Time: 11/10/17  7:38 AM  Result Value Ref Range   Glucose-Capillary 156 (H) 65 - 99 mg/dL   Comment 1 Capillary Specimen   Glucose, capillary     Status: Abnormal   Collection Time: 11/10/17 11:36 AM  Result Value Ref Range   Glucose-Capillary 185 (H) 65 - 99 mg/dL   Comment 1 Capillary Specimen   Glucose, capillary     Status: Abnormal   Collection Time: 11/10/17  3:13 PM  Result Value Ref Range   Glucose-Capillary 190 (H) 65 - 99 mg/dL   Comment 1 Capillary Specimen    Comment 2 Notify RN   Magnesium     Status: None   Collection Time: 11/10/17  5:45 PM  Result Value Ref Range  Magnesium 2.2 1.7 - 2.4 mg/dL    Comment: Performed at [facility], [place] [address], [place], [place] [address]  Phosphorus     Status: None   Collection Time: 11/10/17  5:45 PM  Result Value Ref Range   Phosphorus 4.5 2.5 - 4.6 mg/dL    Comment: Performed at [facility], [place] [address] [place], [place] [address]  Glucose, capillary     Status: Abnormal   Collection Time: 11/10/17  8:36 PM  Result Value Ref Range   Glucose-Capillary 216 (H) 65 - 99 mg/dL   Comment 1 Notify RN   Type and screen     Status: None   Collection Time: 11/10/17  8:58 PM  Result Value Ref Range   ABO/RH(D) O POS    Antibody Screen NEG    Sample Expiration      11/13/2017 Performed at [facility], [place] [address], [place], [place] [address]   Glucose, capillary     Status: Abnormal   Collection Time: 11/11/17 12:19 AM  Result Value Ref Range    Glucose-Capillary 245 (H) 65 - 99 mg/dL   Comment 1 Notify RN   Comprehensive metabolic panel     Status: Abnormal   Collection Time: 11/11/17  3:44 AM  Result Value Ref Range   Sodium 137 135 - 145 mmol/L   Potassium 3.6 3.5 - 5.1 mmol/L   Chloride 102 101 - 111 mmol/L   CO2 22 22 - 32 mmol/L   Glucose, Bld 185 (H) 65 - 99 mg/dL   BUN 21 (H) 6 - 20 mg/dL   Creatinine, Ser 3.50 (H) 0.61 - 1.24 mg/dL   Calcium 7.6 (L) 8.9 - 10.3 mg/dL   Total Protein 5.8 (L) 6.5 - 8.1 g/dL   Albumin 1.9 (L) 3.5 - 5.0 g/dL   AST 81 (H) 15 - 41 U/L   ALT 36 17 - 63 U/L   Alkaline Phosphatase 324 (H) 38 - 126 U/L   Total Bilirubin 12.7 (H) 0.3 - 1.2 mg/dL   GFR calc non Af Amer 18 (L) >60 mL/min   GFR calc Af Amer 21 (L) >60 mL/min    Comment: (NOTE) The eGFR has been calculated using the CKD EPI equation. This calculation has not been validated in all clinical situations. eGFR's persistently <60 mL/min signify possible Chronic Kidney Disease.    Anion gap 13 5 - 15    Comment: Performed at [facility] [address], [place], [place] [address]  CBC with Differential/Platelet     Status: Abnormal   Collection Time: 11/11/17  3:44 AM  Result Value Ref Range   WBC 15.2 (H) 4.0 - 10.5 K/uL   RBC 2.87 (L) 4.22 - 5.81 MIL/uL   Hemoglobin 10.3 (L) 13.0 - 17.0 g/dL   HCT 32.8 (L) 39.0 - 52.0 %   MCV 114.3 (H) 78.0 - 100.0 fL   MCH 35.9 (H) 26.0 - 34.0 pg   MCHC 31.4 30.0 - 36.0 g/dL   RDW 19.2 (H) 11.5 - 15.5 %   Platelets 92 (L) 150 - 400 K/uL    Comment: REPEATED TO VERIFY CONSISTENT WITH PREVIOUS RESULT    Neutrophils Relative % 78 %   Lymphocytes Relative 8 %   Monocytes Relative 12 %   Eosinophils Relative 1 %   Basophils Relative 1 %   Neutro Abs 11.8 (H) 1.7 - 7.7 K/uL   Lymphs Abs 1.2 0.7 - 4.0 K/uL  Monocytes Absolute 1.8 (H) 0.1 - 1.0 K/uL   Eosinophils Absolute 0.2 0.0 - 0.7 K/uL   Basophils Absolute 0.2 (H) 0.0 - 0.1 K/uL   RBC Morphology TARGET CELLS    WBC Morphology  MILD LEFT SHIFT (1-5% METAS, OCC MYELO, OCC BANDS)     Comment: Performed at [facility] [address], [place], [place] [address]  Glucose, capillary     Status: Abnormal   Collection Time: 11/11/17  4:55 AM  Result Value Ref Range   Glucose-Capillary 265 (H) 65 - 99 mg/dL   Comment 1 Notify RN   Glucose, capillary     Status: Abnormal   Collection Time: 11/11/17  7:29 AM  Result Value Ref Range   Glucose-Capillary 225 (H) 65 - 99 mg/dL   Comment 1 Capillary Specimen    Comment 2 Notify RN   Glucose, capillary     Status: Abnormal   Collection Time: 11/11/17 11:29 AM  Result Value Ref Range   Glucose-Capillary 220 (H) 65 - 99 mg/dL   Comment 1 Capillary Specimen    Comment 2 Notify RN   Urinalysis, Routine w reflex microscopic     Status: Abnormal   Collection Time: 11/11/17 12:05 PM  Result Value Ref Range   Color, Urine YELLOW YELLOW   APPearance TURBID (A) CLEAR   Specific Gravity, Urine 1.025 1.005 - 1.030   pH 6.5 5.0 - 8.0   Glucose, UA NEGATIVE NEGATIVE mg/dL   Hgb urine dipstick LARGE (A) NEGATIVE   Bilirubin Urine LARGE (A) NEGATIVE   Ketones, ur NEGATIVE NEGATIVE mg/dL   Protein, ur >300 (A) NEGATIVE mg/dL   Nitrite NEGATIVE NEGATIVE   Leukocytes, UA MODERATE (A) NEGATIVE    Comment: Performed at [facility], [place] [address] [place], [place] [address]  Na and K (sodium & potassium), rand urine     Status: None   Collection Time: 11/11/17 12:05 PM  Result Value Ref Range   Sodium, Ur 91 mmol/L   Potassium Urine 26 mmol/L    Comment: Performed at [facility] [address], [place], [place] [address]  Urinalysis, Microscopic (reflex)     Status: Abnormal   Collection Time: 11/11/17 12:05 PM  Result Value Ref Range   RBC / HPF 6-30 0 - 5 RBC/hpf   WBC, UA TOO NUMEROUS TO COUNT 0 - 5 WBC/hpf   Bacteria, UA MANY (A) NONE SEEN   Squamous Epithelial / LPF 0-5 (A) NONE SEEN    Comment: LESS THAN 10 mL OF URINE SUBMITTED MICROSCOPIC EXAM  PERFORMED ON UNCONCENTRATED URINE    Budding Yeast PRESENT     Comment: Performed at [facility], [place] [address] [place], [place] [address]  Glucose, capillary     Status: Abnormal   Collection Time: 11/11/17  3:32 PM  Result Value Ref Range   Glucose-Capillary 164 (H) 65 - 99 mg/dL   Comment 1 Capillary Specimen    US Renal  Result Date: 11/11/2017 CLINICAL DATA:  Acute renal failure. EXAM: RENAL / URINARY TRACT ULTRASOUND COMPLETE COMPARISON:  CT abdomen pelvis dated October 16, 2017. FINDINGS: Right Kidney: Length: 11.7 cm. Echogenicity within normal limits. No mass or hydronephrosis visualized. Left Kidney: Length: 11.3 cm. Echogenicity within normal limits. No mass or hydronephrosis visualized. Bladder: Decompressed by Foley catheter. Small ascites. IMPRESSION: 1. Normal renal ultrasound. 2. Small ascites. Electronically Signed   By: Titus Dubin M.D.   On: 11/11/2017 10:23   Dg  Chest Port 1 View  Result Date: 11/11/2017 CLINICAL DATA:  Pulmonary edema. EXAM: PORTABLE CHEST 1 VIEW COMPARISON:  11/08/2017 FINDINGS: The endotracheal tube may have been removed retracted slightly in the interim, now terminating approximately 2 cm above the carina. Enteric tube terminates in the distal stomach. The cardiac silhouette is mildly enlarged. Lung volumes are low with similar appearance of mild pulmonary vascular congestion. There is increased retrocardiac opacity in the left lung base likely reflecting atelectasis. No sizable pleural effusion or pneumothorax is identified. IMPRESSION: 1. Endotracheal tube 2 cm above the carina. 2. Low lung volumes with unchanged mild pulmonary vascular congestion and increased left basilar atelectasis. Electronically Signed   By: Logan Bores M.D.   On: 11/11/2017 09:08    Assessment:  1 AKI, likely hemodynamically mediated 2 Acute alcoholic hepatitis with encephalopathy 3 ? UTI v AIN  Plan: 1 urine eosinophils and culture 2 supportive with  stabilization of BP as you have planned  Estanislado Emms 11/11/2017, 4:02 PM

## 2017-11-11 NOTE — Progress Notes (Signed)
Pt extubated, per order, to 4L N/C.  No stridor noted.  RN @ bedside.

## 2017-11-11 NOTE — Progress Notes (Signed)
57 year old admitted several days ago with decompensated cirrhosis and hepatic encephalopathy.  He was transiently intubated because of increased work of breathing and for airway protection, but is currently extubated.  He is on Rocephin.  He is somnolent, on lactulose for his hepatic encephalopathy.  He follows simple commands, inconsistently.  He appears to have passive asterixis (he is not able to cooperate to demonstrate classic asterixis).  The abdomen is tense, consistent with ascites.  He is deeply jaundiced.  There is severe lower extremity edema with bandage wraps for possible cellulitis.  Labs are most pertinent for progressive deterioration of renal function, with high urinary sodium today suggesting that this is not classic hepatorenal syndrome.  Nephrology consultation is pending.  Impression:  1.  Progressive azotemia, possibly due to ATN (will be interested in nephrology opinion) 2.  Decompensated cirrhosis with jaundice, mild elevation of INR, and hepatic encephalopathy  Recommendation:  1.  Add rifaximin 550 twice daily to current regimen for encephalopathy (ordered) 2.  Await Nephrology input for azotemia 3.  We will follow with you at the distance, every several days.  Please call us at any time, however, if more immediate input is desired.  Florencia Reasonsobert V. [place] Dorsey, M.D. Pager [phone] If no answer or after 5 PM call [phone]

## 2017-11-12 DIAGNOSIS — K7290 Hepatic failure, unspecified without coma: Secondary | ICD-10-CM

## 2017-11-12 LAB — COMPREHENSIVE METABOLIC PANEL
ALBUMIN: 2.2 g/dL — AB (ref 3.5–5.0)
ALT: 32 U/L (ref 17–63)
ANION GAP: 14 (ref 5–15)
AST: 79 U/L — AB (ref 15–41)
Alkaline Phosphatase: 262 U/L — ABNORMAL HIGH (ref 38–126)
BUN: 32 mg/dL — AB (ref 6–20)
CHLORIDE: 104 mmol/L (ref 101–111)
CO2: 21 mmol/L — ABNORMAL LOW (ref 22–32)
Calcium: 8.6 mg/dL — ABNORMAL LOW (ref 8.9–10.3)
Creatinine, Ser: 5.12 mg/dL — ABNORMAL HIGH (ref 0.61–1.24)
GFR calc Af Amer: 13 mL/min — ABNORMAL LOW (ref >60)
GFR calc non Af Amer: 12 mL/min — ABNORMAL LOW (ref >60)
GLUCOSE: 90 mg/dL (ref 65–99)
Potassium: 3.4 mmol/L — ABNORMAL LOW (ref 3.5–5.1)
SODIUM: 139 mmol/L (ref 135–145)
Total Bilirubin: 15.4 mg/dL — ABNORMAL HIGH (ref 0.3–1.2)
Total Protein: 6 g/dL — ABNORMAL LOW (ref 6.5–8.1)

## 2017-11-12 LAB — CULTURE, BLOOD (ROUTINE X 2)
Culture: NO GROWTH
Culture: NO GROWTH
SPECIAL REQUESTS: ADEQUATE
Special Requests: ADEQUATE

## 2017-11-12 LAB — URINE CULTURE

## 2017-11-12 LAB — CBC WITH DIFFERENTIAL/PLATELET
Basophils Absolute: 0.2 10*3/uL — ABNORMAL HIGH (ref 0.0–0.1)
Basophils Relative: 1 %
EOS PCT: 1 %
Eosinophils Absolute: 0.2 10*3/uL (ref 0.0–0.7)
HEMATOCRIT: 31.9 % — AB (ref 39.0–52.0)
HEMOGLOBIN: 10.2 g/dL — AB (ref 13.0–17.0)
LYMPHS ABS: 1.8 10*3/uL (ref 0.7–4.0)
LYMPHS PCT: 10 %
MCH: 36.2 pg — AB (ref 26.0–34.0)
MCHC: 32 g/dL (ref 30.0–36.0)
MCV: 113.1 fL — ABNORMAL HIGH (ref 78.0–100.0)
MONOS PCT: 12 %
Monocytes Absolute: 2.2 10*3/uL — ABNORMAL HIGH (ref 0.1–1.0)
NEUTROS ABS: 13.6 10*3/uL — AB (ref 1.7–7.7)
Neutrophils Relative %: 76 %
Platelets: 85 10*3/uL — ABNORMAL LOW (ref 150–400)
RBC: 2.82 MIL/uL — AB (ref 4.22–5.81)
RDW: 18.9 % — ABNORMAL HIGH (ref 11.5–15.5)
WBC: 18 10*3/uL — ABNORMAL HIGH (ref 4.0–10.5)

## 2017-11-12 LAB — GLUCOSE, CAPILLARY
GLUCOSE-CAPILLARY: 105 mg/dL — AB (ref 65–99)
GLUCOSE-CAPILLARY: 156 mg/dL — AB (ref 65–99)
GLUCOSE-CAPILLARY: 168 mg/dL — AB (ref 65–99)
Glucose-Capillary: 96 mg/dL (ref 65–99)
Glucose-Capillary: 171 mg/dL — ABNORMAL HIGH (ref 65–99)
Glucose-Capillary: 102 mg/dL — ABNORMAL HIGH (ref 65–99)

## 2017-11-12 LAB — SODIUM, URINE, RANDOM: Sodium, Ur: 117 mmol/L

## 2017-11-12 MED ORDER — VITAMIN B-1 100 MG PO TABS
100.0000 mg | ORAL_TABLET | Freq: Every day | ORAL | Status: DC
  Administered 2017-11-13 – 2017-11-16 (×4): 100 mg via ORAL
  Filled 2017-11-12 (×5): qty 1

## 2017-11-12 MED ORDER — FOLIC ACID 1 MG PO TABS
1.0000 mg | ORAL_TABLET | Freq: Every day | ORAL | Status: DC
  Administered 2017-11-13 – 2017-11-16 (×4): 1 mg via ORAL
  Filled 2017-11-12 (×5): qty 1

## 2017-11-12 MED ORDER — POTASSIUM CHLORIDE 20 MEQ PO PACK
20.0000 meq | PACK | Freq: Two times a day (BID) | ORAL | Status: AC
  Administered 2017-11-12 (×2): 20 meq via ORAL
  Filled 2017-11-12 (×2): qty 1

## 2017-11-12 NOTE — Progress Notes (Signed)
Assessment:  1 AKI, likely hemodynamically mediated 2 Acute alcoholic cirrhosis, decompensation with encephalopathy 3 ? UTI v AIN  Plan: 1 urine eosinophils and culture-pending 2 supportive with stabilization of BP  3 Dialysis as needed  Subjective: Interval History: Extubated 3/20. Father at bedside.    Objective: Vital signs in last 24 hours: Temp:  [96.6 F (35.9 C)-99.7 F (37.6 C)] 97.7 F (36.5 C) (03/21 1130) Pulse Rate:  [84-119] 91 (03/21 1000) Resp:  [13-23] 18 (03/21 1000) BP: (89-147)/(59-92) 112/69 (03/21 1000) SpO2:  [94 %-99 %] 97 % (03/21 1000) Weight:  [83.9 kg (184 lb 15.5 oz)] 83.9 kg (184 lb 15.5 oz) (03/21 0355) Weight change: -10.1 kg (-22 lb 4.3 oz)  Intake/Output from previous day: 03/20 0701 - 03/21 0700 In: 680 [I.V.:180; NG/GT:350; IV Piggyback:150] Out: 550 [Urine:50; Stool:500] Intake/Output this shift: Total I/O In: 60 [P.O.:60] Out: -   General appearance: agitated Eyes: sceral icterus Resp: clear to auscultation bilaterally GI: protuberant, enlarged liver edge and firm Extremities: edema 1+  Lab Results: Recent Labs    11/11/17 0344 11/12/17 0358  WBC 15.2* 18.0*  HGB 10.3* 10.2*  HCT 32.8* 31.9*  PLT 92* 85*   BMET:  Recent Labs    11/11/17 0344 11/12/17 0358  NA 137 139  K 3.6 3.4*  CL 102 104  CO2 22 21*  GLUCOSE 185* 90  BUN 21* 32*  CREATININE 3.50* 5.12*  CALCIUM 7.6* 8.6*   No results for input(s): PTH in the last 72 hours. Iron Studies: No results for input(s): IRON, TIBC, TRANSFERRIN, FERRITIN in the last 72 hours. Studies/Results: [place] Renal  Result Date: 11/11/2017 CLINICAL DATA:  Acute renal failure. EXAM: RENAL / URINARY TRACT ULTRASOUND COMPLETE COMPARISON:  CT abdomen pelvis dated October 16, 2017. FINDINGS: Right Kidney: Length: 11.7 cm. Echogenicity within normal limits. No mass or hydronephrosis visualized. Left Kidney: Length: 11.3 cm. Echogenicity within normal limits. No mass or hydronephrosis  visualized. Bladder: Decompressed by Foley catheter. Small ascites. IMPRESSION: 1. Normal renal ultrasound. 2. Small ascites. Electronically Signed   By: Obie DredgeWilliam T Derry M.D.   On: 11/11/2017 10:23   Dg Chest Port 1 View  Result Date: 11/11/2017 CLINICAL DATA:  Pulmonary edema. EXAM: PORTABLE CHEST 1 VIEW COMPARISON:  11/08/2017 FINDINGS: The endotracheal tube may have been removed retracted slightly in the interim, now terminating approximately 2 cm above the carina. Enteric tube terminates in the distal stomach. The cardiac silhouette is mildly enlarged. Lung volumes are low with similar appearance of mild pulmonary vascular congestion. There is increased retrocardiac opacity in the left lung base likely reflecting atelectasis. No sizable pleural effusion or pneumothorax is identified. IMPRESSION: 1. Endotracheal tube 2 cm above the carina. 2. Low lung volumes with unchanged mild pulmonary vascular congestion and increased left basilar atelectasis. Electronically Signed   By: Sebastian AcheAllen  Grady M.D.   On: 11/11/2017 09:08    Scheduled: . folic acid  1 mg Per Tube Daily  . heparin injection (subcutaneous)  5,000 Units Subcutaneous Q8H  . insulin aspart  0-15 Units Subcutaneous Q4H  . ipratropium-albuterol  3 mL Nebulization Q4H  . lactulose  10 g Oral TID  . QUEtiapine  25 mg Oral QHS  . rifaximin  550 mg Oral BID  . thiamine  100 mg Per Tube Daily   Continuous: . sodium chloride Stopped (11/10/17 0600)  . cefTRIAXone (ROCEPHIN)  IV Stopped (11/11/17 1825)      LOS: 5 days   Lauris PoagAlvin C Rima Blizzard 11/12/2017,12:17 PM

## 2017-11-12 NOTE — Progress Notes (Signed)
Critical care attending progress note:  Reason for admission: Severe hepatic Encephalopathy.   Summary of admission history and hospital course: 57 year old man with a history of diabetes Parkinson compression fractures and alcohol abuse.  He continues to drink.  He presented to the ED with 2 weeks of increasing edema abdominal swelling and lethargy.  Hypoglycemic due to ongoing insulin use. Intubated for airway protection.  Hypotension.  Treated empirically for presumed SBP although quantity of ascites was not sufficient to tap.  Started on lactulose.  Patient compliance with mechanical ventilation initially difficult due to chronic opioid use.  Patient's mentation began to improve.  Was successfully extubated 3/20.  Patient has developed acute renal failure and has been seen by nephrology anticipates the need for renal replacement therapy.  The following issues were addressed on rounds today:  1.  Hepatic encephalopathy.  Remains somnolent but is generally cooperative with care.  Is able to transfer to chair with assistance.  Remains markedly jaundiced.  Asterixis is present.  Ammonia has been decreasing.  Continues to have loose stools.  Started on rifaximin on GIs recommendation. -Continue lactulose and rifaximin.  2.  Acute renal failure.  Creatinine is now 5.12 and the patient is anuric.  Marked peripheral edema.  No response to diuretic.  ATN is likely cause.  Nephrology does not believe this is hepatorenal syndrome.   -No absolute indication for dialysis.  So we will hold off RRT for now.  3.  Respiratory failure.  Resolved. -Encourage deep breathing.  4.  Chronic pain.  Balancing needs of chronic pain with risk of oversedation with hepatic encephalopathy.  Appears generally comfortable with current dose of as needed hydromorphone and nighttime quetiapine. -Continue current comfort strategy.  Safety and quality of care items:  Early mobility: up to chair Line removal: Peripheral  IVs only Laxative regimen: On lactulose Nutritional status: Moderate nutritional risk Enteral nutrition: On dysphagia diet Foley catheter removal: required to measure urine output. Marked scrotal edema. Renal dose adjustment: No drugs requiring adjustment  electrolyte repletion: None DVT prophylaxis: Heparin for DVT prophylaxis.  Platelet count stable at 85 Transfusion indications: HB 10.2 due to chronic disease.  No indications for transfusion Antibiotic de-escalation: Completed course of antibiotic.  Leukocytosis at 18.0 - follow for now Glycemic target: 70-180.  Euglycemic with subcu insulin sliding scale Steroid use: No systemic steroid use  Summary and disposition: Improving hepatic encephalopathy and respiratory failure.  Hemodynamically stable and generally ready for transfer. however he has developed renal failure and will likely require hemodialysis soon. Ready for transfer to stepdown level of care.  Services provided include examination of the patient, review of relevant ancillary tests, prescription of lifesaving therapies, review of medications and prophylactic therapy and multidisciplinary rounding.  Lynnell Catalanavi Quanell Loughney, MD Critical Care Attending. [facility]. Pager: [phone] After hours: ([phone])

## 2017-11-12 NOTE — Evaluation (Signed)
Clinical/Bedside Swallow Evaluation Patient Details  Name: [name] MRN: [id number] Date of Birth: [birth year]  Today's Date: 11/12/2017 Time: SLP Start Time (ACUTE ONLY): 0816 SLP Stop Time (ACUTE ONLY): 0841 SLP Time Calculation (min) (ACUTE ONLY): 25 min  Past Medical History:  Past Medical History:  Diagnosis Date  . Chronic lower back pain   . Compression fracture    lumbar 3  . Diabetes mellitus   . Fracture acetabulum-closed (HCC) 04/09/2013  . GERD (gastroesophageal reflux disease)   . H/O Legionnaire's disease   . Hepatomegaly   . Migraine   . Osteoporosis   . Pancreatitis   . PTSD (post-traumatic stress disorder)   . Vitamin D deficiency    Past Surgical History:  Past Surgical History:  Procedure Laterality Date  . BACK SURGERY    . CHOLECYSTECTOMY    . HERNIA REPAIR    . KYPHOPLASTY N/A 07/16/2015   Procedure: Lumbar three kyphoplasty;  Surgeon: Coletta Memos, MD;  Location: [facility];  Service: Neurosurgery;  Laterality: N/A;  Lumbar three kyphoplasty  . LAMINECTOMY    . MASS EXCISION Left 06/12/2017   Procedure: EXCISION LEFT AXILLARY SEBACEOUS CYST;  Surgeon: Abigail Miyamoto, MD;  Location: [facility];  Service: General;  Laterality: Left;  . STERNUM FRACTURE SURGERY     HPI:  Pt is a 57 yo male who presented with decompensated cirrhosis with hepatic encephalopathy. PMHx includes DM, PTSD, compression fx's and pain/ debilitation, GERD, EtOH abuse with cirrhosis. Intubated 3/17-3/20, now on 1L nasal canula. Pt recently hospitalized in Feb 2019 for flu. Previously seen by SLP in 2016 following intubation, with FEES revealing reversible dysphagia an dpt was eventually d/c from SLP on regular solid and thin liquid diet.    Assessment / Plan / Recommendation Clinical Impression   Pt presents for bedside swallow study following 4 day intubation. Pt's voice has low instenisity and hoasre vocal quality. Pt has congestive cough at baseline. Pt  had immediate cough response x2 when taking large straw sips of thin liquid from straw; however, pt had no other coughing/throat clearing when taking small, controlled cup sips of thin liquid. Pt had functional oral phase as indicated by timely and efficient oral transfer, clearance, and swallow. Of note, by had increased WOB of breathing by end of trials. Pt benefited from Min verbal cues from SLP to take slow deep breaths and to take a break from PO trials, indicating need for pt to eat slowly in small amounts given how quickly he fatigued. Recommend initiating thin liquid and Dys 2, chopped solids (for energy conservation), with aspiration precautions (NO straws, slow/small bites and sips, eating only when alert/sitting upright). SLP will follow up to ensure safety with prescribed diet and compliance with aspiration precautions.   SLP Visit Diagnosis: Dysphagia, unspecified (R13.10)    Aspiration Risk  Mild aspiration risk    Diet Recommendation Dysphagia 2 (Fine chop);Thin liquid   Liquid Administration via: No straw Medication Administration: Whole meds with liquid Supervision: Patient able to self feed;Intermittent supervision to cue for compensatory strategies Compensations: Slow rate;Small sips/bites;Minimize environmental distractions Postural Changes: Seated upright at 90 degrees    Other  Recommendations Oral Care Recommendations: Oral care BID   Follow up Recommendations None      Frequency and Duration min 1 x/week  1 week       Prognosis Prognosis for Safe Diet Advancement: Good Barriers to Reach Goals: Other (Comment)(mentation)      Swallow Study   General  HPI: Pt is a 57 yo male who presented with decompensated cirrhosis with hepatic encephalopathy. PMHx includes DM, PTSD, compression fx's and pain/ debilitation, GERD, EtOH abuse with cirrhosis. Intubated 3/17-3/20, now on 1L nasal canula. Pt recently hospitalized in Feb 2019 for flu. Previously seen by SLP in 2016  following intubation, with FEES revealing reversible dysphagia an dpt was eventually d/c from SLP on regular solid and thin liquid diet.  Type of Study: Bedside Swallow Evaluation Previous Swallow Assessment: see HPI Diet Prior to this Study: NPO Temperature Spikes Noted: No Respiratory Status: Nasal cannula(1L) History of Recent Intubation: Yes Length of Intubations (days): 4 days Date extubated: 11/11/17 Behavior/Cognition: Alert;Cooperative;Confused Oral Cavity Assessment: Within Functional Limits Oral Care Completed by SLP: Yes Oral Cavity - Dentition: Adequate natural dentition Vision: Functional for self-feeding Self-Feeding Abilities: Able to feed self;Needs set up Patient Positioning: Upright in bed Baseline Vocal Quality: Hoarse;Low vocal intensity Volitional Cough: Strong Volitional Swallow: Able to elicit    Oral/Motor/Sensory Function Overall Oral Motor/Sensory Function: Within functional limits   Ice Chips Ice chips: Within functional limits Presentation: Spoon   Thin Liquid Thin Liquid: Impaired Presentation: Cup;Straw;Self Fed Pharyngeal  Phase Impairments: Cough - Immediate(only with straw)    Nectar Thick Nectar Thick Liquid: Not tested   Honey Thick Honey Thick Liquid: Not tested   Puree Puree: Within functional limits Presentation: Self Fed;Spoon   Solid   GO   [place] Hiran Leard SLP Student Clinician  Solid: Within functional limits Presentation: Self Fed        [place] Jameah Rouser 11/12/2017,8:53 AM

## 2017-11-12 NOTE — Care Management Note (Signed)
Case Management Note  Patient Details  Name: Faythe Gheearon P Fosdick MRN: [id number] Date of Birth: 06/05/1961  Subjective/Objective:            Pt admitted with cirrhosis         Action/Plan:   PTA from home.  [facility] and informed [facility]   Expected Discharge Date:                  Expected Discharge Plan:  Home/Self Care  In-House Referral:     Discharge planning Services  CM Consult  Post Acute Care Choice:    Choice offered to:     DME Arranged:    DME Agency:     HH Arranged:    HH Agency:     Status of Service:     If discussed at [employer] Meetings, dates discussed:    Additional Comments:  Cherylann ParrClaxton, Myalee Stengel S, RN 11/12/2017, 11:31 AM

## 2017-11-12 NOTE — Progress Notes (Signed)
Pharmacy Antibiotic Note  [name] is a 57 y.o. male admitted on 11/07/2017 with decompensated cirrhosis and suspicion for SBP and cellultis.  Pharmacy has been consulted for ceftriaxone dosing.  Today is day 5 of ceftriaxone, vancomycin was discontinued yesterday. Patient is afebrile, WBC is increasing. Patient developed AKI this admission, renal function continues to decline, urine output is negligible. Planning to treat SBP for 7 day course.  Plan: Continue ceftriaxone 2 gram IV q 24 hours for 7 days total - end date 3/23 Pharmacy will sign off - will continue to follow peripherally  Height: 5\' 8"  (172.7 cm) Weight: 184 lb 15.5 oz (83.9 kg) IBW/kg (Calculated) : 68.4  Temp (24hrs), Avg:98.1 F (36.7 C), Min:96.8 F (36 C), Max:99.7 F (37.6 C)  Recent Labs  Lab 11/07/17 1436 11/07/17 1622  11/07/17 1839 11/07/17 1938 11/08/17 0318 11/09/17 0420 11/09/17 2222 11/10/17 0458 11/11/17 0344 11/12/17 0358  WBC  --   --    < >  --   --  5.9 8.8  --  13.8* 15.2* 18.0*  CREATININE  --   --    < >  --   --  0.68 1.61*  --  2.71* 3.50* 5.12*  LATICACIDVEN 3.76* 2.89*  --  2.3* 2.9* 2.6*  --   --   --   --   --   VANCOTROUGH  --   --   --   --   --   --   --  55*  --   --   --    < > = values in this interval not displayed.    Estimated Creatinine Clearance: 17.2 mL/min (A) (by C-G formula based on SCr of 5.12 mg/dL (H)).    Allergies  Allergen Reactions  . Ketorolac Tromethamine Hives  . Temsirolimus Other (See Comments)    Unknown - Pt does not recognize this medication [facility]   Antimicrobials this admission: CTX 3/16>>(3/23) 7 days - stop date in place Vanc 3/17>> 3/20  Dose adjustments: 3/18 VT = 55 >> hold vanc (discontinued 3/20)  Microbiology results: 3/16: hepatitis screen negative 3/16 MRSA PCR: neg 3/16 strep pneumo: neg 3/16 Blood: ngtd 3/17 resp: normal flora 3/17 Ucx: negative Neg HepA, HepB, HCV, strep pneumo  Thank you for  allowing pharmacy to be a part of this patient's care.   Al CorpusLindsey Amybeth Sieg, PharmD PGY1 Pharmacy Resident Pager: [phone] After 4:00PM please call Main Pharmacy [phone] 11/12/2017 8:49 AM

## 2017-11-13 DIAGNOSIS — N170 Acute kidney failure with tubular necrosis: Secondary | ICD-10-CM

## 2017-11-13 LAB — BILIRUBIN, TOTAL: BILIRUBIN TOTAL: 14.6 mg/dL — AB (ref 0.3–1.2)

## 2017-11-13 LAB — CBC WITH DIFFERENTIAL/PLATELET
Basophils Relative: 1 %
Eosinophils Relative: 2 %
HEMATOCRIT: 31.7 % — AB (ref 39.0–52.0)
HEMOGLOBIN: 10.4 g/dL — AB (ref 13.0–17.0)
Lymphocytes Relative: 15 %
MCH: 36.9 pg — AB (ref 26.0–34.0)
MCHC: 32.8 g/dL (ref 30.0–36.0)
MCV: 112.4 fL — AB (ref 78.0–100.0)
Monocytes Relative: 6 %
NEUTROS PCT: 76 %
Platelets: 88 10*3/uL — ABNORMAL LOW (ref 150–400)
RBC: 2.82 MIL/uL — ABNORMAL LOW (ref 4.22–5.81)
RDW: 18.8 % — ABNORMAL HIGH (ref 11.5–15.5)
WBC: 20 10*3/uL — AB (ref 4.0–10.5)

## 2017-11-13 LAB — GLUCOSE, CAPILLARY
GLUCOSE-CAPILLARY: 169 mg/dL — AB (ref 65–99)
GLUCOSE-CAPILLARY: 209 mg/dL — AB (ref 65–99)
GLUCOSE-CAPILLARY: 81 mg/dL (ref 65–99)
Glucose-Capillary: 107 mg/dL — ABNORMAL HIGH (ref 65–99)
Glucose-Capillary: 178 mg/dL — ABNORMAL HIGH (ref 65–99)
Glucose-Capillary: 167 mg/dL — ABNORMAL HIGH (ref 65–99)

## 2017-11-13 LAB — COMPREHENSIVE METABOLIC PANEL
ALK PHOS: 252 U/L — AB (ref 38–126)
ALT: 32 U/L (ref 17–63)
AST: 103 U/L — ABNORMAL HIGH (ref 15–41)
Albumin: 2.2 g/dL — ABNORMAL LOW (ref 3.5–5.0)
Anion gap: 15 (ref 5–15)
BUN: 41 mg/dL — ABNORMAL HIGH (ref 6–20)
CALCIUM: 8.8 mg/dL — AB (ref 8.9–10.3)
CHLORIDE: 103 mmol/L (ref 101–111)
CO2: 18 mmol/L — ABNORMAL LOW (ref 22–32)
CREATININE: 5.81 mg/dL — AB (ref 0.61–1.24)
GFR, EST AFRICAN AMERICAN: 11 mL/min — AB (ref >60)
GFR, EST NON AFRICAN AMERICAN: 10 mL/min — AB (ref >60)
Glucose, Bld: 116 mg/dL — ABNORMAL HIGH (ref 65–99)
Potassium: 4.3 mmol/L (ref 3.5–5.1)
Sodium: 136 mmol/L (ref 135–145)
Total Bilirubin: UNDETERMINED mg/dL (ref 0.3–1.2)
Total Protein: 5.7 g/dL — ABNORMAL LOW (ref 6.5–8.1)

## 2017-11-13 MED ORDER — LACTULOSE 10 GM/15ML PO SOLN: 10.0000 g | Freq: Two times a day (BID) | ORAL | Status: DC

## 2017-11-13 MED ORDER — ORAL CARE MOUTH RINSE
15.0000 mL | Freq: Two times a day (BID) | OROMUCOSAL | Status: DC
  Administered 2017-11-13 – 2017-11-16 (×6): 15 mL via OROMUCOSAL

## 2017-11-13 MED ORDER — WHITE PETROLATUM EX OINT
TOPICAL_OINTMENT | CUTANEOUS | Status: AC
  Administered 2017-11-13: 0.2
  Filled 2017-11-13: qty 28.35

## 2017-11-13 MED ORDER — ALBUTEROL SULFATE (2.5 MG/3ML) 0.083% IN NEBU
INHALATION_SOLUTION | RESPIRATORY_TRACT | Status: AC
  Administered 2017-11-13: 2.5 mg
  Filled 2017-11-13: qty 3

## 2017-11-13 MED ORDER — HYDROMORPHONE HCL 2 MG PO TABS
1.0000 mg | ORAL_TABLET | Freq: Four times a day (QID) | ORAL | Status: DC | PRN
  Administered 2017-11-15: 1 mg via ORAL
  Filled 2017-11-13: qty 1

## 2017-11-13 MED ORDER — ENSURE ENLIVE PO LIQD
237.0000 mL | Freq: Two times a day (BID) | ORAL | Status: DC
  Administered 2017-11-14: 237 mL via ORAL

## 2017-11-13 MED ORDER — IPRATROPIUM-ALBUTEROL 0.5-2.5 (3) MG/3ML IN SOLN
3.0000 mL | Freq: Three times a day (TID) | RESPIRATORY_TRACT | Status: DC
Start: 2017-11-14 — End: 2017-11-22
  Administered 2017-11-14 – 2017-11-22 (×24): 3 mL via RESPIRATORY_TRACT
  Filled 2017-11-13 (×25): qty 3

## 2017-11-13 MED ORDER — ALBUTEROL SULFATE (2.5 MG/3ML) 0.083% IN NEBU
2.5000 mg | INHALATION_SOLUTION | RESPIRATORY_TRACT | Status: DC | PRN
Start: 2017-11-13 — End: 2017-11-16
  Administered 2017-11-15: 2.5 mg via RESPIRATORY_TRACT
  Filled 2017-11-13: qty 3

## 2017-11-13 NOTE — Progress Notes (Signed)
Nutrition Follow-up  DOCUMENTATION CODES:   Not applicable  INTERVENTION:    Ensure Enlive po BID, each supplement provides 350 kcal and 20 grams of protein  NUTRITION DIAGNOSIS:   Inadequate oral intake related to lethargy/confusion as evidenced by meal completion < 50%.  Ongoing  GOAL:   Patient will meet greater than or equal to 90% of their needs  Unmet  MONITOR:   PO intake, Supplement acceptance, Labs, I & O's  ASSESSMENT:   57 yo male with PMH of GERD, osteoporosis, vitamin D deficiency, hepatomegaly, alcohol abuse, pancreatitis, DM, compression fracture, Legionnaire's DZ, PTSD who was admitted on 3/16 with decompensated cirrhosis with hepatic encephalopathy, hypoglycemia. Required intubation on 3/17.  Discussed patient in ICU rounds and with RN today. Extubated 3/20. Diet advanced to Dysphagia 2 diet with thin liquids. Patient is eating poorly. Somewhat confused. Family at bedside. Will add PO supplements. Labs reviewed. Medications reviewed and include folic acid, lactulose, thiamine.  Diet Order:  DIET DYS 2 Room service appropriate? Yes; Fluid consistency: Thin  EDUCATION NEEDS:   No education needs have been identified at this time  Skin:  Skin Assessment: Skin Integrity Issues: Skin Integrity Issues:: Other (Comment) Other: weeping blisters to bilateral LE  Last BM:  3/21  Height:   Ht Readings from Last 1 Encounters:  11/07/17 5\' 8"  (1.727 m)    Weight:   Wt Readings from Last 1 Encounters:  11/13/17 202 lb 13.2 oz (92 kg)    Ideal Body Weight:  70 kg  BMI:  Body mass index is 30.84 kg/m.  Estimated Nutritional Needs:   Kcal:  1650-1850  Protein:  100-115 gm  Fluid:  1.7-1.9 L    Joaquin CourtsKimberly Harris, RD, LDN, CNSC Pager [phone] After Hours Pager [phone]

## 2017-11-13 NOTE — Progress Notes (Signed)
[facility] PROGRESS NOTE Subjective Patient now is extubated and is sitting up eating talking to his sister.  Objective: Vital signs in last 24 hours: Temp:  [96.6 F (35.9 C)-97.9 F (36.6 C)] 97.5 F (36.4 C) (03/22 0731) Pulse Rate:  [82-99] 91 (03/22 1000) Resp:  [13-31] 22 (03/22 1000) BP: (96-154)/(59-94) 110/82 (03/22 1000) SpO2:  [86 %-100 %] 92 % (03/22 1000) Weight:  [92 kg (202 lb 13.2 oz)] 92 kg (202 lb 13.2 oz) (03/22 0327) Last BM Date: 11/12/17  Intake/Output from previous day: 03/21 0701 - 03/22 0700 In: 760 [P.O.:660; IV Piggyback:100] Out: 505 [Urine:5; Stool:500] Intake/Output this shift: Total I/O In: 120 [P.O.:120] Out: 0   PE: General--still somewhat confused but no gross asterixis  Abdomen--distended possible ascites nontender  Lab Results: Recent Labs    11/11/17 0344 11/12/17 0358 11/13/17 0352  WBC 15.2* 18.0* 20.0*  HGB 10.3* 10.2* 10.4*  HCT 32.8* 31.9* 31.7*  PLT 92* 85* 88*   BMET Recent Labs    11/11/17 0344 11/12/17 0358 11/13/17 0352  NA 137 139 136  K 3.6 3.4* 4.3  CL 102 104 103  CO2 22 21* 18*  CREATININE 3.50* 5.12* 5.81*   LFT Recent Labs    11/11/17 0344 11/12/17 0358 11/13/17 0352 11/13/17 0805  PROT 5.8* 6.0* 5.7*  --   AST 81* 79* 103*  --   ALT 36 32 32  --   ALKPHOS 324* 262* 252*  --   BILITOT 12.7* 15.4* QUANTITY NOT SUFFICIENT, UNABLE TO PERFORM TEST 14.6*   PT/INR No results for input(s): LABPROT, INR in the last 72 hours. PANCREAS No results for input(s): LIPASE in the last 72 hours.       Studies/Results: No results found.  Medications: I have reviewed the patient's current medications.  Assessment:   1.  Alcoholic liver disease.  Possible SBP does have some element of hepatic encephalopathy.  His LFTs have remained basically stable.  His INR has been only slightly elevated and has been stable after vitamin K.  He does appear to be minimally improved with Xifaxan and  lactulose. 2.  Respiratory failure.  This appears resolved.  He is on Rocephin.  The exact etiology is still somewhat unclear.  May have been related to overmedication from pain medications. 3.  Acute renal failure.  Renal is on board and the feeling is that this could have been due to ATN.  It was felt that this was not likely hepatorenal syndrome and he has been followed closely.   Plan: 1.  We will order another INR for tomorrow just to make sure his liver function is remained stable.  Discussion of need to consider outpatient alcohol rehab with the patient and his sister.  She seems to feel this would be a good idea but I am not sure that he is really that interested.  This can be addressed further when he is improved.  Our practice will try to follow him every other day.  Please call if there are any changes.   Tresea MallJames L Caiya Bettes 11/13/2017, 11:03 AM  This note was created using voice recognition software. Minor errors may Have occurred unintentionally.  Pager: [phone] If no answer or after hours call [phone]

## 2017-11-13 NOTE — Progress Notes (Signed)
[facility] ROUNDING NOTE   Subjective:    57 year old man with a history of diabetes Parkinson compression fractures and alcohol abuse.  He continues to drink.  He presented to the ED with 2 weeks of increasing edema abdominal swelling and lethargy.  Hypoglycemic due to ongoing insulin use. Intubated for airway protection.  Hypotension.  Treated empirically for presumed SBP although quantity of ascites was not sufficient to tap.  Started on lactulose  He appears to have Acute tubular necrosis and creatinine has plateaued at 5.8   He is still not making much urine though and urine sodium was elevate.     Objective:  Vital signs in last 24 hours:  Temp:  [96.6 F (35.9 C)-97.9 F (36.6 C)] 97.5 F (36.4 C) (03/22 0731) Pulse Rate:  [82-99] 91 (03/22 1000) Resp:  [13-31] 22 (03/22 1000) BP: (96-154)/(59-94) 110/82 (03/22 1000) SpO2:  [86 %-100 %] 92 % (03/22 1000) Weight:  [202 lb 13.2 oz (92 kg)] 202 lb 13.2 oz (92 kg) (03/22 0327)  Weight change: 17 lb 13.7 oz (8.1 kg) Filed Weights   11/11/17 0337 11/12/17 0355 11/13/17 0327  Weight: 207 lb 3.7 oz (94 kg) 184 lb 15.5 oz (83.9 kg) 202 lb 13.2 oz (92 kg)    Intake/Output: I/O last 3 completed shifts: In: 840 [P.O.:660; I.V.:80; IV Piggyback:100] Out: 845 [Urine:45; Stool:800]   Intake/Output this shift:  Total I/O In: 120 [P.O.:120] Out: 0   CVS- RRR  Icteric  RS- CTA ABD- BS present soft non-distended EXT- 1 +  edema   Basic Metabolic Panel: Recent Labs  Lab 11/07/17 1754  11/09/17 0420 11/09/17 1739 11/10/17 0458 11/10/17 1745 11/11/17 0344 11/12/17 0358 11/13/17 0352  NA 131*   < > 131*  --  133*  --  137 139 136  K 2.8*   < > 3.9  --  3.8  --  3.6 3.4* 4.3  CL 96*   < > 98*  --  99*  --  102 104 103  CO2 22   < > 24  --  22  --  22 21* 18*  GLUCOSE 42*   < > 146*  --  136*  --  185* 90 116*  BUN 7   < > 10  --  15  --  21* 32* 41*  CREATININE 0.63   < > 1.61*  --  2.71*  --  3.50* 5.12*  5.81*  CALCIUM 6.4*   < > 6.7*  --  7.1*  --  7.6* 8.6* 8.8*  MG 1.3*  --  1.7 2.2 2.4 2.2  --   --   --   PHOS 2.5  --   --  4.5 5.2* 4.5  --   --   --    < > = values in this interval not displayed.    Liver Function Tests: Recent Labs  Lab 11/07/17 1754 11/09/17 0420 11/11/17 0344 11/12/17 0358 11/13/17 0352 11/13/17 0805  AST 140* 118* 81* 79* 103*  --   ALT 53 44 36 32 32  --   ALKPHOS 344* 321* 324* 262* 252*  --   BILITOT 8.9* 12.7* 12.7* 15.4* QUANTITY NOT SUFFICIENT, UNABLE TO PERFORM TEST 14.6*  PROT 5.6* 5.2* 5.8* 6.0* 5.7*  --   ALBUMIN 1.8* 1.6* 1.9* 2.2* 2.2*  --    Recent Labs  Lab 11/07/17 1306 11/07/17 1754  LIPASE 16 17  AMYLASE  --  45   Recent Labs  Lab 11/08/17 0318 11/09/17 0420 11/10/17 0458  AMMONIA 174* 140* 136*    CBC: Recent Labs  Lab 11/09/17 0420 11/10/17 0458 11/11/17 0344 11/12/17 0358 11/13/17 0352  WBC 8.8 13.8* 15.2* 18.0* 20.0*  NEUTROABS  --  10.8* 11.8* 13.6*  --   HGB 10.0* 10.6* 10.3* 10.2* 10.4*  HCT 30.0* 32.8* 32.8* 31.9* 31.7*  MCV 111.9* 112.7* 114.3* 113.1* 112.4*  PLT 84* 101* 92* 85* 88*    Cardiac Enzymes: Recent Labs  Lab 11/07/17 1754 11/08/17 0031 11/08/17 0318  TROPONINI <0.03 0.03* <0.03    BNP: Invalid input(s): POCBNP  CBG: Recent Labs  Lab 11/12/17 1525 11/12/17 2000 11/12/17 2330 11/13/17 0347 11/13/17 0733  GLUCAP 171* 102* 156* 107* 169*    Microbiology: Results for orders placed or performed during the hospital encounter of 11/07/17  Culture, blood (routine x 2)     Status: None   Collection Time: 11/07/17  5:55 PM  Result Value Ref Range Status   Specimen Description BLOOD RIGHT HAND  Final   Special Requests   Final    BOTTLES DRAWN AEROBIC AND ANAEROBIC Blood Culture adequate volume   Culture   Final    NO GROWTH 5 DAYS Performed at [facility], [address] [place], [place] [address]    Report Status 11/12/2017 FINAL  Final  Culture, blood (routine x 2)      Status: None   Collection Time: 11/07/17  6:40 PM  Result Value Ref Range Status   Specimen Description BLOOD RIGHT HAND  Final   Special Requests IN PEDIATRIC BOTTLE Blood Culture adequate volume  Final   Culture   Final    NO GROWTH 5 DAYS Performed at [facility], [address], [place], [place] [address]    Report Status 11/12/2017 FINAL  Final  MRSA PCR Screening     Status: None   Collection Time: 11/07/17  8:29 PM  Result Value Ref Range Status   MRSA by PCR NEGATIVE NEGATIVE Final    Comment:        The GeneXpert MRSA Assay (FDA approved for NASAL specimens only), is one component of a comprehensive MRSA colonization surveillance program. It is not intended to diagnose MRSA infection nor to guide or monitor treatment for MRSA infections. Performed at [facility], [address] [place], [place] [address]   Urine Culture     Status: None   Collection Time: 11/08/17  2:09 AM  Result Value Ref Range Status   Specimen Description URINE, RANDOM  Final   Special Requests NONE  Final   Culture   Final    NO GROWTH Performed at [facility], [address] [place], [place] [address]    Report Status 11/09/2017 FINAL  Final  Culture, respiratory (NON-Expectorated)     Status: None   Collection Time: 11/08/17  5:05 AM  Result Value Ref Range Status   Specimen Description TRACHEAL ASPIRATE  Final   Special Requests NONE  Final   Gram Stain   Final    ABUNDANT WBC PRESENT,BOTH PMN AND MONONUCLEAR RARE SQUAMOUS EPITHELIAL CELLS PRESENT ABUNDANT GRAM POSITIVE COCCI IN PAIRS IN CLUSTERS ABUNDANT GRAM NEGATIVE COCCI IN PAIRS RARE GRAM POSITIVE RODS    Culture   Final    MODERATE Consistent with normal respiratory flora. Performed at [facility], [address] [place], [place] [address]    Report Status 11/10/2017 FINAL  Final  Urine Culture  Status: Abnormal   Collection Time: 11/11/17  4:27 PM  Result Value Ref Range Status    Specimen Description URINE, CATHETERIZED  Final   Special Requests   Final    NONE Performed at [facility], [address] [place], [place] [address]    Culture MULTIPLE SPECIES PRESENT, SUGGEST RECOLLECTION (A)  Final   Report Status 11/12/2017 FINAL  Final    Coagulation Studies: No results for input(s): LABPROT, INR in the last 72 hours.  Urinalysis: Recent Labs    11/11/17 1205  COLORURINE YELLOW  LABSPEC 1.025  PHURINE 6.5  GLUCOSEU NEGATIVE  HGBUR LARGE*  BILIRUBINUR LARGE*  KETONESUR NEGATIVE  PROTEINUR >300*  NITRITE NEGATIVE  LEUKOCYTESUR MODERATE*      Imaging: No results found.   Medications:   . sodium chloride Stopped (11/10/17 0600)  . cefTRIAXone (ROCEPHIN)  IV Stopped (11/12/17 1737)   . folic acid  1 mg Oral Daily  . heparin injection (subcutaneous)  5,000 Units Subcutaneous Q8H  . insulin aspart  0-15 Units Subcutaneous Q4H  . ipratropium-albuterol  3 mL Nebulization Q4H  . [START ON 11/15/2017] lactulose  10 g Oral BID  . QUEtiapine  25 mg Oral QHS  . rifaximin  550 mg Oral BID  . thiamine  100 mg Oral Daily   sodium chloride, HYDROmorphone  Assessment/ Plan:   Acute kidney injury  Thought to be ATN although certainly would fit with hepatorenal syndrome   He has acute alcoholic cirrhosis  No indication for dialysis at this point  Will continue to follow   LOS: 6 Jolanda Mccann W @TODAY @11 :03 AM

## 2017-11-13 NOTE — Care Management Note (Signed)
Case Management Note  Patient Details  Name: [name] MRN: [id number] Date of Birth: 02/08/1961  Subjective/Objective:            Pt admitted with cirrhosis         Action/Plan:   PTA from home.  [facility] and informed [facility].  CM also contacted [facility] at [facility].  [facility] with [facility].     Expected Discharge Date:                  Expected Discharge Plan:  Home/Self Care  In-House Referral:     Discharge planning Services  CM Consult  Post Acute Care Choice:    Choice offered to:     DME Arranged:    DME Agency:     HH Arranged:    HH Agency:     Status of Service:     If discussed at [employer] Length of Stay Meetings, dates discussed:    Additional Comments: 11/13/2017 Pt extubated today.  PTA  from home with wife.  Per [place] in [place] - he gets medicines from both local drug store and the [facility] when needed.  PT eval requested  Cherylann ParrClaxton, Ketura Sirek S, RN 11/13/2017, 2:44 PM

## 2017-11-13 NOTE — Progress Notes (Signed)
  Speech Language Pathology Treatment: Dysphagia  Patient Details Name: Faythe Gheearon P Plair MRN: [id number] DOB: 05/29/1961 Today's Date: 11/13/2017 Time: [id number] SLP Time Calculation (min) (ACUTE ONLY): 15 min  Assessment / Plan / Recommendation Clinical Impression  Pt alert, groggy; sister present.  Limited intake today; lungs with rhonchi.  Sips of thin liquid observed to elicit wet voice, cough, concerning for aspiration.  Phonation remains hoarse s/p extubation.  Trials of honey-thick liquids were tolerated with no clinical s/s of aspiration.  Recommend changing diet to dysphagia 2, honey-thick liquids.  Hold tray if coughing with intake.  D/W RN.  SLP will follow for safety and determine necessity of instrumental swallow study.   HPI HPI: Pt is a 57 yo male who presented with decompensated cirrhosis with hepatic encephalopathy. PMHx includes DM, PTSD, compression fx's and pain/ debilitation, GERD, EtOH abuse with cirrhosis. Intubated 3/17-3/20, now on 1L nasal canula. Pt recently hospitalized in Feb 2019 for flu. Previously seen by SLP in 2016 following intubation, with FEES revealing reversible dysphagia an dpt was eventually d/c from SLP on regular solid and thin liquid diet.       SLP Plan  Continue with current plan of care       Recommendations  Diet recommendations: Dysphagia 2 (fine chop);Honey-thick liquid Liquids provided via: Cup;Teaspoon Medication Administration: Whole meds with puree Supervision: Staff to assist with self feeding Compensations: Slow rate;Small sips/bites;Minimize environmental distractions Postural Changes and/or Swallow Maneuvers: Seated upright 90 degrees                Oral Care Recommendations: Oral care BID Follow up Recommendations: Other (comment)(tba) SLP Visit Diagnosis: Dysphagia, unspecified (R13.10) Plan: Continue with current plan of care       GO                Carolan ShiverCouture, Adelfo Diebel Laurice 11/13/2017, 3:58 PM

## 2017-11-13 NOTE — Progress Notes (Signed)
Critical care attending progress note:  Reason for admission: Severe hepatic Encephalopathy.   Summary of admission history and hospital course: 57 year old man with a history of diabetes Parkinson compression fractures and alcohol abuse.  He continues to drink.  He presented to the ED with 2 weeks of increasing edema abdominal swelling and lethargy.  Hypoglycemic due to ongoing insulin use. Intubated for airway protection.  Hypotension.  Treated empirically for presumed SBP although quantity of ascites was not sufficient to tap.  Started on lactulose.  Patient compliance with mechanical ventilation initially difficult due to chronic opioid use.  Patient's mentation began to improve.  Was successfully extubated 3/20.  Patient has developed acute renal failure and has been seen by nephrology anticipates the need for renal replacement therapy.  The following issues were addressed on rounds today:  1.  Hepatic encephalopathy.  Remains somnolent but is generally cooperative with care.  Is able to transfer to chair with assistance.  Remains markedly jaundiced.  Asterixis is not present.  Ammonia has been decreasing.  Continues to have loose stools.  Started on rifaximin on GIs recommendation. -Continue rifaximin. - Hold lactulose for a day and restart at lower dose.  2.  Acute renal failure.  Creatinine is now 5.81 and the patient is anuric. Rate of creatinine rise is slowing down so renal function may be starting to recover.  Marked peripheral edema with venous stasis ulceration on lower extremities. Urine has pyuria but multiple organisms on U/A consistent with colonization.  No response to diuretic.  ATN is likely cause.  Mild worsening acidosis. Nephrology does not believe this is hepatorenal syndrome.   -No absolute indication for dialysis.  So we will hold off RRT for now.  3.  Respiratory failure.  Resolved. -Encourage deep breathing.  4.  Chronic pain.  Balancing needs of chronic pain with  risk of oversedation with hepatic encephalopathy.  Appears generally comfortable with current dose of as needed hydromorphone and nighttime quetiapine. Patient requesting pain medications when doesn't appear in pain. - Increase interval for hydromorphone.  Safety and quality of care items:  Early mobility: up to chair Line removal: Peripheral IVs only Laxative regimen: On lactulose Nutritional status: Moderate nutritional risk Enteral nutrition: On dysphagia diet Foley catheter removal: required to measure urine output. Marked scrotal edema. Renal dose adjustment: No drugs requiring adjustment  electrolyte repletion: None DVT prophylaxis: Heparin for DVT prophylaxis.  Platelet count stable at 85 Transfusion indications: HB 10.2 due to chronic disease.  No indications for transfusion Antibiotic de-escalation: Completed course of antibiotic.  Leukocytosis at 20.0 without clear source - follow for now Glycemic target: 70-180.  Euglycemic with subcutaneous insulin sliding scale Steroid use: No systemic steroid use  Summary and disposition: Improving hepatic encephalopathy and respiratory failure.  Hemodynamically stable and generally ready for transfer. however he has developed renal failure and will likely require hemodialysis soon. Ready for transfer to stepdown level of care.  Services provided include examination of the patient, review of relevant ancillary tests, prescription of lifesaving therapies, review of medications and prophylactic therapy and multidisciplinary rounding.  Lynnell Catalanavi Trevan Messman, MD Critical Care Attending. [facility]. Pager: [phone] After hours: ([phone])

## 2017-11-14 ENCOUNTER — Other Ambulatory Visit: Payer: Self-pay

## 2017-11-14 ENCOUNTER — Inpatient Hospital Stay (HOSPITAL_COMMUNITY): Payer: Medicare HMO

## 2017-11-14 DIAGNOSIS — F1010 Alcohol abuse, uncomplicated: Secondary | ICD-10-CM

## 2017-11-14 LAB — POCT I-STAT 3, ART BLOOD GAS (G3+)
ACID-BASE DEFICIT: 5 mmol/L — AB (ref 0.0–2.0)
Bicarbonate: 19.7 mmol/L — ABNORMAL LOW (ref 20.0–28.0)
O2 Saturation: 81 %
PCO2 ART: 35.3 mmHg (ref 32.0–48.0)
PH ART: 7.351 (ref 7.350–7.450)
PO2 ART: 46 mmHg — AB (ref 83.0–108.0)
Patient temperature: 97.5
TCO2: 21 mmol/L — ABNORMAL LOW (ref 22–32)

## 2017-11-14 LAB — CBC WITH DIFFERENTIAL/PLATELET
Basophils Absolute: 0.2 10*3/uL — ABNORMAL HIGH (ref 0.0–0.1)
Basophils Relative: 1 %
EOS PCT: 1 %
Eosinophils Absolute: 0.2 10*3/uL (ref 0.0–0.7)
HEMATOCRIT: 32 % — AB (ref 39.0–52.0)
Hemoglobin: 10.2 g/dL — ABNORMAL LOW (ref 13.0–17.0)
LYMPHS ABS: 1.8 10*3/uL (ref 0.7–4.0)
Lymphocytes Relative: 9 %
MCH: 36.4 pg — ABNORMAL HIGH (ref 26.0–34.0)
MCHC: 31.9 g/dL (ref 30.0–36.0)
MCV: 114.3 fL — AB (ref 78.0–100.0)
MONO ABS: 2.6 10*3/uL — AB (ref 0.1–1.0)
Monocytes Relative: 13 %
NEUTROS PCT: 76 %
Neutro Abs: 15.2 10*3/uL — ABNORMAL HIGH (ref 1.7–7.7)
Platelets: 110 10*3/uL — ABNORMAL LOW (ref 150–400)
RBC: 2.8 MIL/uL — AB (ref 4.22–5.81)
RDW: 18.8 % — AB (ref 11.5–15.5)
WBC: 20 10*3/uL — AB (ref 4.0–10.5)

## 2017-11-14 LAB — GLUCOSE, CAPILLARY
GLUCOSE-CAPILLARY: 125 mg/dL — AB (ref 65–99)
GLUCOSE-CAPILLARY: 171 mg/dL — AB (ref 65–99)
GLUCOSE-CAPILLARY: 126 mg/dL — AB (ref 65–99)
GLUCOSE-CAPILLARY: 116 mg/dL — AB (ref 65–99)
Glucose-Capillary: 123 mg/dL — ABNORMAL HIGH (ref 65–99)
Glucose-Capillary: 91 mg/dL (ref 65–99)

## 2017-11-14 LAB — PROTIME-INR
INR: 1.35
Prothrombin Time: 16.6 seconds — ABNORMAL HIGH (ref 11.4–15.2)

## 2017-11-14 LAB — BASIC METABOLIC PANEL
ANION GAP: 17 — AB (ref 5–15)
BUN: 41 mg/dL — AB (ref 6–20)
CO2: 20 mmol/L — AB (ref 22–32)
Calcium: 8.8 mg/dL — ABNORMAL LOW (ref 8.9–10.3)
Chloride: 99 mmol/L — ABNORMAL LOW (ref 101–111)
Creatinine, Ser: 6.82 mg/dL — ABNORMAL HIGH (ref 0.61–1.24)
GFR calc Af Amer: 9 mL/min — ABNORMAL LOW (ref >60)
GFR calc non Af Amer: 8 mL/min — ABNORMAL LOW (ref >60)
GLUCOSE: 121 mg/dL — AB (ref 65–99)
POTASSIUM: 4.3 mmol/L (ref 3.5–5.1)
Sodium: 136 mmol/L (ref 135–145)

## 2017-11-14 LAB — AMMONIA: Ammonia: 82 umol/L — ABNORMAL HIGH (ref 9–35)

## 2017-11-14 MED ORDER — ALBUMIN HUMAN 25 % IV SOLN
1.0000 mL/kg | Freq: Two times a day (BID) | INTRAVENOUS | Status: DC
  Administered 2017-11-14 – 2017-11-18 (×9): 93.5 mL via INTRAVENOUS
  Filled 2017-11-14 (×12): qty 100

## 2017-11-14 MED ORDER — MIDODRINE HCL 5 MG PO TABS
10.0000 mg | ORAL_TABLET | Freq: Three times a day (TID) | ORAL | Status: DC
Start: 2017-11-14 — End: 2017-11-17
  Administered 2017-11-14 – 2017-11-17 (×8): 10 mg via ORAL
  Filled 2017-11-14 (×11): qty 2

## 2017-11-14 MED ORDER — OCTREOTIDE ACETATE 100 MCG/ML IJ SOLN
200.0000 ug | Freq: Two times a day (BID) | INTRAMUSCULAR | Status: DC
  Administered 2017-11-14 – 2017-12-05 (×40): 200 ug via SUBCUTANEOUS
  Filled 2017-11-14 (×51): qty 2

## 2017-11-14 MED ORDER — LACTULOSE 10 GM/15ML PO SOLN
10.0000 g | Freq: Three times a day (TID) | ORAL | Status: DC
  Administered 2017-11-14: 10 g via ORAL
  Filled 2017-11-14 (×2): qty 15

## 2017-11-14 NOTE — Progress Notes (Signed)
PROGRESS NOTE    [name]  ZOX:[id number] DOB: 09-25-1960 DOA: 11/07/2017 PCP: Dettinger, Elige Radon, MD   Brief Narrative:  57 year old WM PMHx Diabetes type II uncontrolled with complication, Parkinson, EtOH abuse (continuing to drink), Compression fractures  He  has a past medical history of Chronic lower back pain, Compression fracture, Diabetes mellitus, Fracture acetabulum-closed (HCC) (04/09/2013), GERD (gastroesophageal reflux disease), H/O Legionnaire's disease, Hepatomegaly, Migraine, Osteoporosis, Pancreatitis, PTSD (post-traumatic stress disorder), and Vitamin D deficiency.     Presented to the ED with 2 weeks of increasing edema abdominal swelling and lethargy.  Hypoglycemic due to ongoing insulin use. Intubated for airway protection.  Hypotension.  Treated empirically for presumed SBP although quantity of ascites was not sufficient to tap.  Started on lactulose.   Patient compliance with mechanical ventilation initially difficult due to chronic opioid use.  Patient's mentation began to improve.  Was successfully extubated 3/20.   Patient has developed acute renal failure and has been seen by nephrology anticipates the need for renal replacement therapy.    Subjective: 3/20 3A/O x3 (does not know when).  Negative CP, positive S OB, negative abdominal pain, negative N/V.  States drinks a bottle of wine per day.  Unsure he wants to participate in a alcohol rehab program but will think about it.   Assessment & Plan:   Active Problems:   Hypoxia   Liver failure (HCC)   EtOH abuse -Folate 1 g daily - Thiamine 100 mg daily - Patient outside window for acute alcohol withdrawal no need for CIWA scale  EtOH Liver Cirrhosis -Patient heavy alcohol user.  Admits to drinking at least 1 bottle of wine per day more than likely drinks much more considering his present condition. -Monitor closely - Rifaximin 550 mg BID + lactulose PRN -Patient abdomen remains distended  ascites?  On 3/20 renal ultrasound showed only small ascites, therefore distention most likely secondary to worsening hepatorenal syndrome--> anasarca  Hepatic Encephalopathy - Ammonia level= 82, some improvement in patient's cognition per his family. - Increase lactulose 10 g TID  Acute Renal Failure vs Hepatorenal syndrome. -Nephrology on board -Per nephrology HD NOT indicated at this time -Per nephrology recommendation started octreotide + Midodrine + Albumin  Recent Labs  Lab 11/08/17 0318 11/09/17 0420 11/10/17 0458 11/11/17 0344 11/12/17 0358 11/13/17 0352 11/14/17 0625  CREATININE 0.68 1.61* 2.71* 3.50* 5.12* 5.81* 6.82*  - Creatinine has not plateaued continues to worsen, await nephrology recommendation on when/if to start CRRT/HD   Acute respiratory failure with hypoxia -Resolved - Titrate O2 to maintain SPO2> 93%   Chronic pain syndrome - Judicious use of sedating medication given patient's cognitive change.  Leukocytosis   Goals of care - PALLIATIVE CARE: Patient EtOH abuse, acute hepatorenal syndrome, not certain he is interested in EtOH rehab program.  Address CODE STATUS (should be DNR), short-term vs long-term goals of care    DVT prophylaxis: Subcu heparin Code Status: Full Family Communication: Family present at bedside for discussion of plan of care Disposition Plan: Awaiting input from palliative care   Consultants:  [facility]    Procedures/Significant Events:  3/16 PCXR:- Orthopedic fixation hardware in the sternum incidentally noted.-Multiple old healed fractures of the ribs bilateral 3/20 renal ultrasound- normal       I have personally reviewed and interpreted all radiology studies and my findings are as above.  VENTILATOR SETTINGS:      Cultures 3/16 MRSA by PCR negative 3/17 urine negative 3/17 Tracheal aspirate normal flora  3/20 urine positive multiple species     Antimicrobials: Anti-infectives  (From admission, onward)   Start     Stop   11/11/17 2200  rifaximin (XIFAXAN) tablet 550 mg         11/08/17 2200  vancomycin (VANCOCIN) 1,500 mg in sodium chloride 0.9 % 500 mL IVPB  Status:  Discontinued     11/09/17 1210   11/08/17 0900  vancomycin (VANCOCIN) 2,000 mg in sodium chloride 0.9 % 500 mL IVPB     11/08/17 1200   11/07/17 1800  cefTRIAXone (ROCEPHIN) 2 g in sodium chloride 0.9 % 100 mL IVPB     11/14/17 1759       Devices    LINES / TUBES:      Continuous Infusions: . sodium chloride Stopped (11/10/17 0600)  . cefTRIAXone (ROCEPHIN)  IV Stopped (11/13/17 1920)     Objective: Vitals:   11/14/17 0800 11/14/17 0830 11/14/17 0900 11/14/17 0920  BP:  108/76 (!) 124/91   Pulse: 95 99 (!) 105 98  Resp: 15 (!) 23 (!) 22 (!) 23  Temp: (!) 97.5 F (36.4 C) (!) 97.3 F (36.3 C) (!) 97.5 F (36.4 C) (!) 97.5 F (36.4 C)  TempSrc:  Bladder    SpO2: (!) 88% 95% (!) 88% 92%  Weight:      Height:        Intake/Output Summary (Last 24 hours) at 11/14/2017 0940 Last data filed at 11/14/2017 0800 Gross per 24 hour  Intake 235 ml  Output 391 ml  Net -156 ml   Filed Weights   11/12/17 0355 11/13/17 0327 11/14/17 0500  Weight: 184 lb 15.5 oz (83.9 kg) 202 lb 13.2 oz (92 kg) 206 lb 2.1 oz (93.5 kg)    Examination:  General: A/O x3 (does not know when) positive acute respiratory distress Eyes: negative scleral hemorrhage, negative anisocoria, positive icterus Neck:  Negative scars, masses, torticollis, lymphadenopathy, JVD Lungs: Clear to auscultation bilaterally without wheezes or crackles Cardiovascular: Regular rate and rhythm without murmur gallop or rub normal S1 and S2 Abdomen: Morbidly obese, negative abdominal pain, positive severe distention,, positive soft, bowel sounds, no rebound, positive ascites, no appreciable mass Extremities: anasarca Skin: Positive bilateral lower extremity venous stasis ulcer Psychiatric:  Negative depression, negative  anxiety, negative fatigue, negative mania, extremely poor understanding of disease process Central nervous system:  Cranial nerves II through XII intact, tongue/uvula midline, all extremities muscle strength 5/5, sensation intact throughout, negative dysarthria, negative expressive aphasia, negative receptive aphasia.  .     Data Reviewed: Care during the described time interval was provided by me .  I have reviewed this patient's available data, including medical history, events of note, physical examination, and all test results as part of my evaluation.   CBC: Recent Labs  Lab 11/10/17 0458 11/11/17 0344 11/12/17 0358 11/13/17 0352 11/14/17 0625  WBC 13.8* 15.2* 18.0* 20.0* 20.0*  NEUTROABS 10.8* 11.8* 13.6*  --  15.2*  HGB 10.6* 10.3* 10.2* 10.4* 10.2*  HCT 32.8* 32.8* 31.9* 31.7* 32.0*  MCV 112.7* 114.3* 113.1* 112.4* 114.3*  PLT 101* 92* 85* 88* 110*   Basic Metabolic Panel: Recent Labs  Lab 11/07/17 1754  11/09/17 0420 11/09/17 1739 11/10/17 0458 11/10/17 1745 11/11/17 0344 11/12/17 0358 11/13/17 0352 11/14/17 0625  NA 131*   < > 131*  --  133*  --  137 139 136 136  K 2.8*   < > 3.9  --  3.8  --  3.6 3.4* 4.3 4.3  CL 96*   < > 98*  --  99*  --  102 104 103 99*  CO2 22   < > 24  --  22  --  22 21* 18* 20*  GLUCOSE 42*   < > 146*  --  136*  --  185* 90 116* 121*  BUN 7   < > 10  --  15  --  21* 32* 41* 41*  CREATININE 0.63   < > 1.61*  --  2.71*  --  3.50* 5.12* 5.81* 6.82*  CALCIUM 6.4*   < > 6.7*  --  7.1*  --  7.6* 8.6* 8.8* 8.8*  MG 1.3*  --  1.7 2.2 2.4 2.2  --   --   --   --   PHOS 2.5  --   --  4.5 5.2* 4.5  --   --   --   --    < > = values in this interval not displayed.   GFR: Estimated Creatinine Clearance: 13.6 mL/min (A) (by C-G formula based on SCr of 6.82 mg/dL (H)). Liver Function Tests: Recent Labs  Lab 11/07/17 1754 11/09/17 0420 11/11/17 0344 11/12/17 0358 11/13/17 0352 11/13/17 0805  AST 140* 118* 81* 79* 103*  --   ALT 53 44 36 32  32  --   ALKPHOS 344* 321* 324* 262* 252*  --   BILITOT 8.9* 12.7* 12.7* 15.4* QUANTITY NOT SUFFICIENT, UNABLE TO PERFORM TEST 14.6*  PROT 5.6* 5.2* 5.8* 6.0* 5.7*  --   ALBUMIN 1.8* 1.6* 1.9* 2.2* 2.2*  --    Recent Labs  Lab 11/07/17 1306 11/07/17 1754  LIPASE 16 17  AMYLASE  --  45   Recent Labs  Lab 11/07/17 1401 11/08/17 0318 11/09/17 0420 11/10/17 0458 11/14/17 0625  AMMONIA 150* 174* 140* 136* 82*   Coagulation Profile: Recent Labs  Lab 11/07/17 1754 11/09/17 0420 11/14/17 0625  INR 1.90 1.60 1.35   Cardiac Enzymes: Recent Labs  Lab 11/07/17 1754 11/08/17 0031 11/08/17 0318  TROPONINI <0.03 0.03* <0.03   BNP (last 3 results) No results for input(s): PROBNP in the last 8760 hours. HbA1C: No results for input(s): HGBA1C in the last 72 hours. CBG: Recent Labs  Lab 11/13/17 1537 11/13/17 1926 11/13/17 2323 11/14/17 0346 11/14/17 0736  GLUCAP 167* 209* 81 91 125*   Lipid Profile: No results for input(s): CHOL, HDL, LDLCALC, TRIG, CHOLHDL, LDLDIRECT in the last 72 hours. Thyroid Function Tests: No results for input(s): TSH, T4TOTAL, FREET4, T3FREE, THYROIDAB in the last 72 hours. Anemia Panel: No results for input(s): VITAMINB12, FOLATE, FERRITIN, TIBC, IRON, RETICCTPCT in the last 72 hours. Urine analysis:    Component Value Date/Time   COLORURINE YELLOW 11/11/2017 1205   APPEARANCEUR TURBID (A) 11/11/2017 1205   LABSPEC 1.025 11/11/2017 1205   PHURINE 6.5 11/11/2017 1205   GLUCOSEU NEGATIVE 11/11/2017 1205   HGBUR LARGE (A) 11/11/2017 1205   BILIRUBINUR LARGE (A) 11/11/2017 1205   BILIRUBINUR n 02/28/2011 1247   KETONESUR NEGATIVE 11/11/2017 1205   PROTEINUR >300 (A) 11/11/2017 1205   UROBILINOGEN 1.0 01/17/2015 1305   NITRITE NEGATIVE 11/11/2017 1205   LEUKOCYTESUR MODERATE (A) 11/11/2017 1205   Sepsis Labs: @LABRCNTIP (procalcitonin:4,lacticidven:4)  ) Recent Results (from the past 240 hour(s))  Culture, blood (routine x 2)      Status: None   Collection Time: 11/07/17  5:55 PM  Result Value Ref Range Status   Specimen Description BLOOD RIGHT HAND  Final   Special  Requests   Final    BOTTLES DRAWN AEROBIC AND ANAEROBIC Blood Culture adequate volume   Culture   Final    NO GROWTH 5 DAYS Performed at [facility], [address], [place], [place] [address]    Report Status 11/12/2017 FINAL  Final  Culture, blood (routine x 2)     Status: None   Collection Time: 11/07/17  6:40 PM  Result Value Ref Range Status   Specimen Description BLOOD RIGHT HAND  Final   Special Requests IN PEDIATRIC BOTTLE Blood Culture adequate volume  Final   Culture   Final    NO GROWTH 5 DAYS Performed at [facility], [address], [place], [place] [address]    Report Status 11/12/2017 FINAL  Final  MRSA PCR Screening     Status: None   Collection Time: 11/07/17  8:29 PM  Result Value Ref Range Status   MRSA by PCR NEGATIVE NEGATIVE Final    Comment:        The GeneXpert MRSA Assay (FDA approved for NASAL specimens only), is one component of a comprehensive MRSA colonization surveillance program. It is not intended to diagnose MRSA infection nor to guide or monitor treatment for MRSA infections. Performed at [facility], [address] [place], [place] [address]   Urine Culture     Status: None   Collection Time: 11/08/17  2:09 AM  Result Value Ref Range Status   Specimen Description URINE, RANDOM  Final   Special Requests NONE  Final   Culture   Final    NO GROWTH Performed at [facility], [address] [place], [place] [address]    Report Status 11/09/2017 FINAL  Final  Culture, respiratory (NON-Expectorated)     Status: None   Collection Time: 11/08/17  5:05 AM  Result Value Ref Range Status   Specimen Description TRACHEAL ASPIRATE  Final   Special Requests NONE  Final   Gram Stain   Final    ABUNDANT WBC PRESENT,BOTH PMN AND MONONUCLEAR RARE SQUAMOUS EPITHELIAL CELLS  PRESENT ABUNDANT GRAM POSITIVE COCCI IN PAIRS IN CLUSTERS ABUNDANT GRAM NEGATIVE COCCI IN PAIRS RARE GRAM POSITIVE RODS    Culture   Final    MODERATE Consistent with normal respiratory flora. Performed at [facility], [address] [place], [place] [address]    Report Status 11/10/2017 FINAL  Final  Urine Culture     Status: Abnormal   Collection Time: 11/11/17  4:27 PM  Result Value Ref Range Status   Specimen Description URINE, CATHETERIZED  Final   Special Requests   Final    NONE Performed at [facility], [address] [place], [place] [address]    Culture MULTIPLE SPECIES PRESENT, SUGGEST RECOLLECTION (A)  Final   Report Status 11/12/2017 FINAL  Final         Radiology Studies: No results found.      Scheduled Meds: . feeding supplement (ENSURE ENLIVE)  237 mL Oral BID BM  . folic acid  1 mg Oral Daily  . heparin injection (subcutaneous)  5,000 Units Subcutaneous Q8H  . insulin aspart  0-15 Units Subcutaneous Q4H  . ipratropium-albuterol  3 mL Nebulization TID  . [START ON 11/15/2017] lactulose  10 g Oral BID  . mouth rinse  15 mL Mouth Rinse BID  . QUEtiapine  25 mg Oral QHS  . rifaximin  550 mg Oral BID  . thiamine  100  mg Oral Daily   Continuous Infusions: . sodium chloride Stopped (11/10/17 0600)  . cefTRIAXone (ROCEPHIN)  IV Stopped (11/13/17 1920)     LOS: 7 days    Time spent: 40 minutes    Chales Pelissier, Roselind Messier, MD [facility] Pager [phone]   If 7PM-7AM, please contact night-coverage [email or web address] Password [facility] 11/14/2017, 9:40 AM

## 2017-11-14 NOTE — Plan of Care (Signed)
Weaned to [place] only. FMS is out. Foley remains per Nephro request. MASD to buttocks. Swallow still impaired, closes monitoring required when eating. BUE wrist restraints. Dressings to BLE. Renal function continues to decline.

## 2017-11-14 NOTE — Progress Notes (Signed)
[facility] ROUNDING NOTE   Subjective:    57 year old man with a history of diabetes Parkinson compression fractures and alcohol abuse.  He continues to drink.  He presented to the ED with 2 weeks of increasing edema abdominal swelling and lethargy.  Hypoglycemic due to ongoing insulin use. Intubated for airway protection.  Hypotension.  Treated empirically for presumed SBP although quantity of ascites was not sufficient to tap.  Started on lactulose  Creatinine continues to rise to 6.8 today    Pretty much oliguric   This is a difficult situation and not sure that dialysis would be indicated  The initial finding would suggest ATN    Objective:  Vital signs in last 24 hours:  Temp:  [97 F (36.1 C)-98.5 F (36.9 C)] 97.5 F (36.4 C) (03/23 0920) Pulse Rate:  [76-105] 98 (03/23 0920) Resp:  [15-24] 23 (03/23 0920) BP: (99-124)/(63-91) 124/91 (03/23 0900) SpO2:  [86 %-96 %] 92 % (03/23 0920) Weight:  [206 lb 2.1 oz (93.5 kg)] 206 lb 2.1 oz (93.5 kg) (03/23 0500)  Weight change: 3 lb 4.9 oz (1.5 kg) Filed Weights   11/12/17 0355 11/13/17 0327 11/14/17 0500  Weight: 184 lb 15.5 oz (83.9 kg) 202 lb 13.2 oz (92 kg) 206 lb 2.1 oz (93.5 kg)    Intake/Output: I/O last 3 completed shifts: In: 235 [P.O.:135; IV Piggyback:100] Out: 596 [Urine:35; Stool:561]   Intake/Output this shift:  Total I/O In: 120 [P.O.:120] Out: 0   CVS- RRR  Icteric  RS- CTA ABD- BS present soft non-distended EXT- 1 +  edema   Basic Metabolic Panel: Recent Labs  Lab 11/07/17 1754  11/09/17 0420 11/09/17 1739 11/10/17 0458 11/10/17 1745 11/11/17 0344 11/12/17 0358 11/13/17 0352 11/14/17 0625  NA 131*   < > 131*  --  133*  --  137 139 136 136  K 2.8*   < > 3.9  --  3.8  --  3.6 3.4* 4.3 4.3  CL 96*   < > 98*  --  99*  --  102 104 103 99*  CO2 22   < > 24  --  22  --  22 21* 18* 20*  GLUCOSE 42*   < > 146*  --  136*  --  185* 90 116* 121*  BUN 7   < > 10  --  15  --  21* 32*  41* 41*  CREATININE 0.63   < > 1.61*  --  2.71*  --  3.50* 5.12* 5.81* 6.82*  CALCIUM 6.4*   < > 6.7*  --  7.1*  --  7.6* 8.6* 8.8* 8.8*  MG 1.3*  --  1.7 2.2 2.4 2.2  --   --   --   --   PHOS 2.5  --   --  4.5 5.2* 4.5  --   --   --   --    < > = values in this interval not displayed.    Liver Function Tests: Recent Labs  Lab 11/07/17 1754 11/09/17 0420 11/11/17 0344 11/12/17 0358 11/13/17 0352 11/13/17 0805  AST 140* 118* 81* 79* 103*  --   ALT 53 44 36 32 32  --   ALKPHOS 344* 321* 324* 262* 252*  --   BILITOT 8.9* 12.7* 12.7* 15.4* QUANTITY NOT SUFFICIENT, UNABLE TO PERFORM TEST 14.6*  PROT 5.6* 5.2* 5.8* 6.0* 5.7*  --   ALBUMIN 1.8* 1.6* 1.9* 2.2* 2.2*  --    Recent Labs  Lab 11/07/17 1306 11/07/17 1754  LIPASE 16 17  AMYLASE  --  45   Recent Labs  Lab 11/09/17 0420 11/10/17 0458 11/14/17 0625  AMMONIA 140* 136* 82*    CBC: Recent Labs  Lab 11/10/17 0458 11/11/17 0344 11/12/17 0358 11/13/17 0352 11/14/17 0625  WBC 13.8* 15.2* 18.0* 20.0* 20.0*  NEUTROABS 10.8* 11.8* 13.6*  --  15.2*  HGB 10.6* 10.3* 10.2* 10.4* 10.2*  HCT 32.8* 32.8* 31.9* 31.7* 32.0*  MCV 112.7* 114.3* 113.1* 112.4* 114.3*  PLT 101* 92* 85* 88* 110*    Cardiac Enzymes: Recent Labs  Lab 11/07/17 1754 11/08/17 0031 11/08/17 0318  TROPONINI <0.03 0.03* <0.03    BNP: Invalid input(s): POCBNP  CBG: Recent Labs  Lab 11/13/17 1537 11/13/17 1926 11/13/17 2323 11/14/17 0346 11/14/17 0736  GLUCAP 167* 209* 81 91 125*    Microbiology: Results for orders placed or performed during the hospital encounter of 11/07/17  Culture, blood (routine x 2)     Status: None   Collection Time: 11/07/17  5:55 PM  Result Value Ref Range Status   Specimen Description BLOOD RIGHT HAND  Final   Special Requests   Final    BOTTLES DRAWN AEROBIC AND ANAEROBIC Blood Culture adequate volume   Culture   Final    NO GROWTH 5 DAYS Performed at [facility], [address],  [place], [place] [address]    Report Status 11/12/2017 FINAL  Final  Culture, blood (routine x 2)     Status: None   Collection Time: 11/07/17  6:40 PM  Result Value Ref Range Status   Specimen Description BLOOD RIGHT HAND  Final   Special Requests IN PEDIATRIC BOTTLE Blood Culture adequate volume  Final   Culture   Final    NO GROWTH 5 DAYS Performed at [facility], [address] [place], [place] [address]    Report Status 11/12/2017 FINAL  Final  MRSA PCR Screening     Status: None   Collection Time: 11/07/17  8:29 PM  Result Value Ref Range Status   MRSA by PCR NEGATIVE NEGATIVE Final    Comment:        The GeneXpert MRSA Assay (FDA approved for NASAL specimens only), is one component of a comprehensive MRSA colonization surveillance program. It is not intended to diagnose MRSA infection nor to guide or monitor treatment for MRSA infections. Performed at [facility], [address], [place], [place] [address]   Urine Culture     Status: None   Collection Time: 11/08/17  2:09 AM  Result Value Ref Range Status   Specimen Description URINE, RANDOM  Final   Special Requests NONE  Final   Culture   Final    NO GROWTH Performed at [facility], [address], [place], [place] [address]    Report Status 11/09/2017 FINAL  Final  Culture, respiratory (NON-Expectorated)     Status: None   Collection Time: 11/08/17  5:05 AM  Result Value Ref Range Status   Specimen Description TRACHEAL ASPIRATE  Final   Special Requests NONE  Final   Gram Stain   Final    ABUNDANT WBC PRESENT,BOTH PMN AND MONONUCLEAR RARE SQUAMOUS EPITHELIAL CELLS PRESENT ABUNDANT GRAM POSITIVE COCCI IN PAIRS IN CLUSTERS ABUNDANT GRAM NEGATIVE COCCI IN PAIRS RARE GRAM POSITIVE RODS    Culture   Final    MODERATE Consistent with normal respiratory flora. Performed at [facility], [address] [place], [place] [address]    Report Status 11/10/2017 FINAL  Final  Urine Culture      Status: Abnormal   Collection Time: 11/11/17  4:27 PM  Result Value Ref Range Status   Specimen Description URINE, CATHETERIZED  Final   Special Requests   Final    NONE Performed at [facility], [address] [place], [place] [address]    Culture MULTIPLE SPECIES PRESENT, SUGGEST RECOLLECTION (A)  Final   Report Status 11/12/2017 FINAL  Final    Coagulation Studies: Recent Labs    11/14/17 0625  LABPROT 16.6*  INR 1.35    Urinalysis: Recent Labs    11/11/17 1205  COLORURINE YELLOW  LABSPEC 1.025  PHURINE 6.5  GLUCOSEU NEGATIVE  HGBUR LARGE*  BILIRUBINUR LARGE*  KETONESUR NEGATIVE  PROTEINUR >300*  NITRITE NEGATIVE  LEUKOCYTESUR MODERATE*      Imaging: No results found.   Medications:   . sodium chloride Stopped (11/10/17 0600)  . cefTRIAXone (ROCEPHIN)  IV Stopped (11/13/17 1920)   . feeding supplement (ENSURE ENLIVE)  237 mL Oral BID BM  . folic acid  1 mg Oral Daily  . heparin injection (subcutaneous)  5,000 Units Subcutaneous Q8H  . insulin aspart  0-15 Units Subcutaneous Q4H  . ipratropium-albuterol  3 mL Nebulization TID  . [START ON 11/15/2017] lactulose  10 g Oral BID  . mouth rinse  15 mL Mouth Rinse BID  . QUEtiapine  25 mg Oral QHS  . rifaximin  550 mg Oral BID  . thiamine  100 mg Oral Daily   sodium chloride, albuterol, HYDROmorphone  Assessment/ Plan:   Acute kidney injury  Thought to be ATN although certainly would fit with hepatorenal syndrome   He has acute alcoholic cirrhosis  No indication for dialysis at this point  Will continue to follow  Very challenging situation  With anuric renal failure  It does not appear that dialysis would be indicated at this point  It may be worth trying some octreotide and midodrine and albumin   LOS: 7 [name] W @TODAY @9 :32 AM

## 2017-11-14 NOTE — Progress Notes (Addendum)
  Speech Language Pathology Treatment: Dysphagia  Patient Details Name: [name] MRN: [id number] DOB: 06/17/1961 Today's Date: 11/14/2017 Time: [id number] SLP Time Calculation (min) (ACUTE ONLY): 19 min  Assessment / Plan / Recommendation Clinical Impression  Pt with overt s/s of aspiration including wet vocal quality/delayed cough after consumption of puree/honey-thickened liquids via tsp; pt lethargic/impulsive requiring wrist restraints prior to SLP arrival and O2 increased to 4L overnight; recommend NPO d/t decreased LOA, overt s/s of aspiration and moderate risk for continued aspiration; recommend instrumental assessment to determine appropriate diet/strategies required during PO intake; pt not appropriate for MBS this date d/t LOA/decreased cognitive status per observation during BSE; family in agreement for potential MBS pending pt status on 11/15/17.  HPI HPI: Pt is a 57 yo male who presented with decompensated cirrhosis with hepatic encephalopathy. PMHx includes DM, PTSD, compression fx's and pain/ debilitation, GERD, EtOH abuse with cirrhosis. Intubated 3/17-3/20, now on 1L nasal canula. Pt recently hospitalized in Feb 2019 for flu. Previously seen by SLP in 2016 following intubation, with FEES revealing reversible dysphagia an dpt was eventually d/c from SLP on regular solid and thin liquid diet.       SLP Plan  Continue with current plan of care   pending instrumental assessment    Recommendations  Diet recommendations: NPO and/or comfort feeding via tsp if pt requests; stop if s/s of aspiration noted(until instrumental assessment completed) Medication Administration: Via alternative means                Oral Care Recommendations: Oral care QID Follow up Recommendations: Other (comment)(TBD) SLP Visit Diagnosis: Dysphagia, unspecified (R13.10) Plan: Continue with current plan of care                       Tressie StalkerPat Adams, M.S., CCC-SLP 11/14/2017, 11:36 AM

## 2017-11-14 NOTE — Progress Notes (Signed)
11/14/17  1030 hrs: The patient's mental status continues to become more agitated and delirious. The patient successfully self-removed his fecal management system, and attempted to remove his Foley catheter, BLE dressings, and PIV. The patient has screamed all throughout the morning that he "is leaving" and that "the doctor told me I can go". Staff has not been able to reorient the patient. All apparent physical needs have been met. We will not be giving narcotic pain meds for now, based on the patient's response to these medications yesterday ( he was drowsy, unable to stay awake). As the patient will require multiple IV infusions today, this RN requested and received an order for BUE soft wrist restraints from Dr. Sherral Hammers. The restraints were applied per policy. Will CTM for opportunities to remove the restraints safely.

## 2017-11-14 NOTE — Evaluation (Signed)
Physical Therapy Evaluation Patient Details Name: [name] MRN: [id number] DOB: 09/01/1960 Today's Date: 11/14/2017   History of Present Illness  Pt adm with abd swelling and lethargy and found to have acute hepatic encephalopathy. Pt intubated for airway protection  3/17-3/20. Pt also with acute kidney injury. PMH - DM, PTSD, compression fx's and pain/ debilitation, GERD, EtOH abuse with cirrhosis  Clinical Impression  Pt presents to PT with dependencies in mobility due to weakness and poor cognition. At current level recommend ST-SNF. However as pt's cognition and medical status improves he may make fast progress with mobility and be able to return home with wife is she is able to care for him.    Follow Up Recommendations SNF    Equipment Recommendations  None recommended by PT    Recommendations for Other Services       Precautions / Restrictions Precautions Precautions: Fall Restrictions Weight Bearing Restrictions: No      Mobility  Bed Mobility Overal bed mobility: Needs Assistance Bed Mobility: Supine to Sit;Sit to Supine     Supine to sit: +2 for physical assistance;Mod assist Sit to supine: +2 for physical assistance;Mod assist   General bed mobility comments: Assist to bring legs off bed, elevate trunk into sitting, and bring hips to EOB. Assist to lower trunk and bring legs back into bed returning to supine.  Transfers Overall transfer level: Needs assistance Equipment used: 2 person hand held assist Transfers: Sit to/from Stand Sit to Stand: +2 physical assistance;Min assist         General transfer comment: Assist for balance and to bring hips up  Ambulation/Gait Ambulation/Gait assistance: +2 physical assistance;Min assist Ambulation Distance (Feet): 6 Feet(forward and back) Assistive device: 2 person hand held assist Gait Pattern/deviations: Step-through pattern;Decreased stride length;Drifts right/left;Wide base of support Gait velocity:  decr Gait velocity interpretation: Below normal speed for age/gender General Gait Details: Assist for balance and support. Amb on O2 with SpO2 to 89%.  Stairs            Wheelchair Mobility    Modified Rankin (Stroke Patients Only)       Balance Overall balance assessment: Needs assistance Sitting-balance support: No upper extremity supported;Feet supported Sitting balance-Leahy Scale: Fair     Standing balance support: Single extremity supported Standing balance-Leahy Scale: Poor Standing balance comment: UE support and min assist for static standing                             Pertinent Vitals/Pain Pain Assessment: 0-10 Pain Score: 8  Pain Location: back, legs    Home Living Family/patient expects to be discharged to:: Private residence Living Arrangements: Spouse/significant other Available Help at Discharge: Family Type of Home: House Home Access: Stairs to enter Entrance Stairs-Rails: Right Entrance Stairs-Number of Steps: 2 Home Layout: One level Home Equipment: Walker - 2 wheels;Cane - single point;Wheelchair - manual      Prior Function Level of Independence: Needs assistance   Gait / Transfers Assistance Needed: Amb modified independent with cane           Hand Dominance   Dominant Hand: Right    Extremity/Trunk Assessment   Upper Extremity Assessment Upper Extremity Assessment: Defer to OT evaluation    Lower Extremity Assessment Lower Extremity Assessment: Generalized weakness       Communication   Communication: No difficulties  Cognition Arousal/Alertness: Awake/alert Behavior During Therapy: Flat affect Overall Cognitive Status: Impaired/Different from baseline Area  of Impairment: Orientation;Attention;Memory;Following commands;Safety/judgement;Problem solving                 Orientation Level: Disoriented to;Situation;Time Current Attention Level: Sustained Memory: Decreased short-term memory;Decreased  recall of precautions Following Commands: Follows one step commands consistently Safety/Judgement: Decreased awareness of safety;Decreased awareness of deficits   Problem Solving: Slow processing;Requires verbal cues;Difficulty sequencing;Requires tactile cues        General Comments      Exercises     Assessment/Plan    PT Assessment Patient needs continued PT services  PT Problem List Decreased strength;Decreased activity tolerance;Decreased balance;Decreased mobility;Decreased cognition       PT Treatment Interventions DME instruction;Gait training;Functional mobility training;Therapeutic activities;Therapeutic exercise;Balance training;Patient/family education;Cognitive remediation    PT Goals (Current goals can be found in the Care Plan section)  Acute Rehab PT Goals Patient Stated Goal: not stated PT Goal Formulation: With patient Time For Goal Achievement: 11/28/17 Potential to Achieve Goals: Good    Frequency Min 3X/week   Barriers to discharge        Co-evaluation               AM-PAC PT "6 Clicks" Daily Activity  Outcome Measure Difficulty turning over in bed (including adjusting bedclothes, sheets and blankets)?: Unable Difficulty moving from lying on back to sitting on the side of the bed? : Unable Difficulty sitting down on and standing up from a chair with arms (e.g., wheelchair, bedside commode, etc,.)?: Unable Help needed moving to and from a bed to chair (including a wheelchair)?: A Little Help needed walking in hospital room?: A Little Help needed climbing 3-5 steps with a railing? : A Lot 6 Click Score: 11    End of Session Equipment Utilized During Treatment: Gait belt;Oxygen Activity Tolerance: Patient limited by fatigue Patient left: in bed;with call bell/phone within reach;with bed alarm set;with nursing/sitter in room;with family/visitor present Nurse Communication: Mobility status PT Visit Diagnosis: Unsteadiness on feet  (R26.81);Other abnormalities of gait and mobility (R26.89);Muscle weakness (generalized) (M62.81)    Time: 1610-9604 PT Time Calculation (min) (ACUTE ONLY): 19 min   Charges:   PT Evaluation $PT Eval Moderate Complexity: 1 Mod     PT G CodesMarland Kitchen        [facility] PT [phone]   Angelina Ok [facility] 11/14/2017, 12:46 PM

## 2017-11-15 DIAGNOSIS — Z515 Encounter for palliative care: Secondary | ICD-10-CM

## 2017-11-15 DIAGNOSIS — S32030S Wedge compression fracture of third lumbar vertebra, sequela: Secondary | ICD-10-CM

## 2017-11-15 DIAGNOSIS — K767 Hepatorenal syndrome: Secondary | ICD-10-CM

## 2017-11-15 DIAGNOSIS — D72828 Other elevated white blood cell count: Secondary | ICD-10-CM

## 2017-11-15 DIAGNOSIS — Z7189 Other specified counseling: Secondary | ICD-10-CM

## 2017-11-15 LAB — COMPREHENSIVE METABOLIC PANEL
ALBUMIN: 2.5 g/dL — AB (ref 3.5–5.0)
ALT: 38 U/L (ref 17–63)
ANION GAP: 13 (ref 5–15)
AST: 94 U/L — ABNORMAL HIGH (ref 15–41)
Alkaline Phosphatase: 207 U/L — ABNORMAL HIGH (ref 38–126)
BILIRUBIN TOTAL: 13.1 mg/dL — AB (ref 0.3–1.2)
BUN: 50 mg/dL — ABNORMAL HIGH (ref 6–20)
CALCIUM: 9 mg/dL (ref 8.9–10.3)
CO2: 21 mmol/L — ABNORMAL LOW (ref 22–32)
Chloride: 105 mmol/L (ref 101–111)
Creatinine, Ser: 7.74 mg/dL — ABNORMAL HIGH (ref 0.61–1.24)
GFR calc Af Amer: 8 mL/min — ABNORMAL LOW (ref >60)
GFR, EST NON AFRICAN AMERICAN: 7 mL/min — AB (ref >60)
Glucose, Bld: 109 mg/dL — ABNORMAL HIGH (ref 65–99)
POTASSIUM: 4.1 mmol/L (ref 3.5–5.1)
Sodium: 139 mmol/L (ref 135–145)
TOTAL PROTEIN: 5.9 g/dL — AB (ref 6.5–8.1)

## 2017-11-15 LAB — GLUCOSE, CAPILLARY
GLUCOSE-CAPILLARY: 149 mg/dL — AB (ref 65–99)
GLUCOSE-CAPILLARY: 152 mg/dL — AB (ref 65–99)
Glucose-Capillary: 105 mg/dL — ABNORMAL HIGH (ref 65–99)
Glucose-Capillary: 118 mg/dL — ABNORMAL HIGH (ref 65–99)
Glucose-Capillary: 119 mg/dL — ABNORMAL HIGH (ref 65–99)

## 2017-11-15 LAB — CBC WITH DIFFERENTIAL/PLATELET
BAND NEUTROPHILS: 0 %
BASOS PCT: 1 %
Basophils Absolute: 0.2 10*3/uL — ABNORMAL HIGH (ref 0.0–0.1)
Blasts: 0 %
EOS ABS: 0.4 10*3/uL (ref 0.0–0.7)
Eosinophils Relative: 2 %
HCT: 29.1 % — ABNORMAL LOW (ref 39.0–52.0)
Hemoglobin: 9.3 g/dL — ABNORMAL LOW (ref 13.0–17.0)
LYMPHS ABS: 2.5 10*3/uL (ref 0.7–4.0)
LYMPHS PCT: 14 %
MCH: 37.5 pg — AB (ref 26.0–34.0)
MCHC: 32 g/dL (ref 30.0–36.0)
MCV: 117.3 fL — ABNORMAL HIGH (ref 78.0–100.0)
Metamyelocytes Relative: 0 %
Monocytes Absolute: 0.9 10*3/uL (ref 0.1–1.0)
Monocytes Relative: 5 %
Myelocytes: 0 %
Neutro Abs: 13.9 10*3/uL — ABNORMAL HIGH (ref 1.7–7.7)
Neutrophils Relative %: 78 %
OTHER: 0 %
PLATELETS: 101 10*3/uL — AB (ref 150–400)
PROMYELOCYTES ABS: 0 %
RBC: 2.48 MIL/uL — ABNORMAL LOW (ref 4.22–5.81)
RDW: 19.1 % — AB (ref 11.5–15.5)
WBC: 17.9 10*3/uL — ABNORMAL HIGH (ref 4.0–10.5)
nRBC: 0 /100 WBC

## 2017-11-15 LAB — PROTIME-INR
INR: 1.45
PROTHROMBIN TIME: 17.5 s — AB (ref 11.4–15.2)

## 2017-11-15 LAB — AMMONIA: Ammonia: 122 umol/L — ABNORMAL HIGH (ref 9–35)

## 2017-11-15 LAB — MAGNESIUM: MAGNESIUM: 2.8 mg/dL — AB (ref 1.7–2.4)

## 2017-11-15 MED ORDER — PIPERACILLIN-TAZOBACTAM 3.375 G IVPB
3.3750 g | Freq: Two times a day (BID) | INTRAVENOUS | Status: DC
  Administered 2017-11-15 – 2017-11-16 (×3): 3.375 g via INTRAVENOUS
  Filled 2017-11-15 (×5): qty 50

## 2017-11-15 MED ORDER — LACTULOSE 10 GM/15ML PO SOLN
20.0000 g | Freq: Three times a day (TID) | ORAL | Status: DC
Start: 2017-11-15 — End: 2017-11-17
  Administered 2017-11-15 – 2017-11-16 (×4): 20 g via ORAL
  Filled 2017-11-15 (×7): qty 30

## 2017-11-15 NOTE — Progress Notes (Signed)
[facility] ROUNDING NOTE   Subjective:    57 year old man with a history of diabetes Parkinson compression fractures and alcohol abuse.  He continues to drink.  He presented to the ED with 2 weeks of increasing edema abdominal swelling and lethargy.  Hypoglycemic due to ongoing insulin use. Intubated for airway protection.  Hypotension.  Treated empirically for presumed SBP although quantity of ascites was not sufficient to tap.  Started on lactulose  Creatinine continues to rise to 7.7   today ( 6.8 3/23)   Pretty much oliguric   This is a difficult situation and not sure that dialysis would be indicated  The initial finding would suggest ATN    Objective:  Vital signs in last 24 hours:  Temp:  [96.8 F (36 C)-97.5 F (36.4 C)] 97 F (36.1 C) (03/24 0400) Pulse Rate:  [64-96] 66 (03/24 0800) Resp:  [13-31] 23 (03/24 0800) BP: (96-117)/(56-96) 111/72 (03/24 0800) SpO2:  [91 %-96 %] 91 % (03/24 0800) Weight:  [194 lb 7.1 oz (88.2 kg)] 194 lb 7.1 oz (88.2 kg) (03/24 0500)  Weight change: -11 lb 11 oz (-5.3 kg) Filed Weights   11/13/17 0327 11/14/17 0500 11/15/17 0500  Weight: 202 lb 13.2 oz (92 kg) 206 lb 2.1 oz (93.5 kg) 194 lb 7.1 oz (88.2 kg)    Intake/Output: I/O last 3 completed shifts: In: 457 [P.O.:240; I.V.:30; IV Piggyback:187] Out: 290 [Urine:30; Stool:260]   Intake/Output this shift:  No intake/output data recorded.  CVS- RRR  Icteric  RS- CTA ABD- BS present soft non-distended EXT- 1 +  edema   Basic Metabolic Panel: Recent Labs  Lab 11/09/17 0420 11/09/17 1739 11/10/17 0458 11/10/17 1745 11/11/17 0344 11/12/17 0358 11/13/17 0352 11/14/17 0625 11/15/17 0336  NA 131*  --  133*  --  137 139 136 136 139  K 3.9  --  3.8  --  3.6 3.4* 4.3 4.3 4.1  CL 98*  --  99*  --  102 104 103 99* 105  CO2 24  --  22  --  22 21* 18* 20* 21*  GLUCOSE 146*  --  136*  --  185* 90 116* 121* 109*  BUN 10  --  15  --  21* 32* 41* 41* 50*  CREATININE  1.61*  --  2.71*  --  3.50* 5.12* 5.81* 6.82* 7.74*  CALCIUM 6.7*  --  7.1*  --  7.6* 8.6* 8.8* 8.8* 9.0  MG 1.7 2.2 2.4 2.2  --   --   --   --  2.8*  PHOS  --  4.5 5.2* 4.5  --   --   --   --   --     Liver Function Tests: Recent Labs  Lab 11/09/17 0420 11/11/17 0344 11/12/17 0358 11/13/17 0352 11/13/17 0805 11/15/17 0336  AST 118* 81* 79* 103*  --  94*  ALT 44 36 32 32  --  38  ALKPHOS 321* 324* 262* 252*  --  207*  BILITOT 12.7* 12.7* 15.4* QUANTITY NOT SUFFICIENT, UNABLE TO PERFORM TEST 14.6* 13.1*  PROT 5.2* 5.8* 6.0* 5.7*  --  5.9*  ALBUMIN 1.6* 1.9* 2.2* 2.2*  --  2.5*   No results for input(s): LIPASE, AMYLASE in the last 168 hours. Recent Labs  Lab 11/10/17 0458 11/14/17 0625 11/15/17 0336  AMMONIA 136* 82* 122*    CBC: Recent Labs  Lab 11/10/17 0458 11/11/17 0344 11/12/17 0358 11/13/17 0352 11/14/17 0625 11/15/17 0336  WBC 13.8*  15.2* 18.0* 20.0* 20.0* 17.9*  NEUTROABS 10.8* 11.8* 13.6*  --  15.2* 13.9*  HGB 10.6* 10.3* 10.2* 10.4* 10.2* 9.3*  HCT 32.8* 32.8* 31.9* 31.7* 32.0* 29.1*  MCV 112.7* 114.3* 113.1* 112.4* 114.3* 117.3*  PLT 101* 92* 85* 88* 110* 101*    Cardiac Enzymes: No results for input(s): CKTOTAL, CKMB, CKMBINDEX, TROPONINI in the last 168 hours.  BNP: Invalid input(s): POCBNP  CBG: Recent Labs  Lab 11/14/17 1530 11/14/17 2033 11/14/17 2347 11/15/17 0406 11/15/17 0746  GLUCAP 126* 116* 123* 105* 118*    Microbiology: Results for orders placed or performed during the hospital encounter of 11/07/17  Culture, blood (routine x 2)     Status: None   Collection Time: 11/07/17  5:55 PM  Result Value Ref Range Status   Specimen Description BLOOD RIGHT HAND  Final   Special Requests   Final    BOTTLES DRAWN AEROBIC AND ANAEROBIC Blood Culture adequate volume   Culture   Final    NO GROWTH 5 DAYS Performed at [facility], [address] [place], [place] [address]    Report Status 11/12/2017 FINAL  Final  Culture,  blood (routine x 2)     Status: None   Collection Time: 11/07/17  6:40 PM  Result Value Ref Range Status   Specimen Description BLOOD RIGHT HAND  Final   Special Requests IN PEDIATRIC BOTTLE Blood Culture adequate volume  Final   Culture   Final    NO GROWTH 5 DAYS Performed at [facility], [address], [place], [place] [address]    Report Status 11/12/2017 FINAL  Final  MRSA PCR Screening     Status: None   Collection Time: 11/07/17  8:29 PM  Result Value Ref Range Status   MRSA by PCR NEGATIVE NEGATIVE Final    Comment:        The GeneXpert MRSA Assay (FDA approved for NASAL specimens only), is one component of a comprehensive MRSA colonization surveillance program. It is not intended to diagnose MRSA infection nor to guide or monitor treatment for MRSA infections. Performed at [facility], [address], [place], [place] [address]   Urine Culture     Status: None   Collection Time: 11/08/17  2:09 AM  Result Value Ref Range Status   Specimen Description URINE, RANDOM  Final   Special Requests NONE  Final   Culture   Final    NO GROWTH Performed at [facility], [address], [place], [place] [address]    Report Status 11/09/2017 FINAL  Final  Culture, respiratory (NON-Expectorated)     Status: None   Collection Time: 11/08/17  5:05 AM  Result Value Ref Range Status   Specimen Description TRACHEAL ASPIRATE  Final   Special Requests NONE  Final   Gram Stain   Final    ABUNDANT WBC PRESENT,BOTH PMN AND MONONUCLEAR RARE SQUAMOUS EPITHELIAL CELLS PRESENT ABUNDANT GRAM POSITIVE COCCI IN PAIRS IN CLUSTERS ABUNDANT GRAM NEGATIVE COCCI IN PAIRS RARE GRAM POSITIVE RODS    Culture   Final    MODERATE Consistent with normal respiratory flora. Performed at [facility], [address] [place], [place] [address]    Report Status 11/10/2017 FINAL  Final  Urine Culture     Status: Abnormal   Collection Time: 11/11/17  4:27 PM  Result Value Ref  Range Status   Specimen Description URINE, CATHETERIZED  Final   Special Requests  Final    NONE Performed at [facility], [address] [place], [place] [address]    Culture MULTIPLE SPECIES PRESENT, SUGGEST RECOLLECTION (A)  Final   Report Status 11/12/2017 FINAL  Final    Coagulation Studies: Recent Labs    11/14/17 0625 11/15/17 0336  LABPROT 16.6* 17.5*  INR 1.35 1.45    Urinalysis: No results for input(s): COLORURINE, LABSPEC, PHURINE, GLUCOSEU, HGBUR, BILIRUBINUR, KETONESUR, PROTEINUR, UROBILINOGEN, NITRITE, LEUKOCYTESUR in the last 72 hours.  Invalid input(s): APPERANCEUR    Imaging: Dg Chest Port 1 View  Result Date: 11/14/2017 CLINICAL DATA:  Hypoxia. EXAM: PORTABLE CHEST 1 VIEW COMPARISON:  11/11/2017.  Chest CT dated 10/16/2017. FINDINGS: Stable enlarged cardiac silhouette and prominence of the interstitial markings. Increased prominence of the pulmonary vasculature. Mild decrease in patchy opacity at the left lung base. Stable sternal plates and screws. Previously demonstrated old bilateral rib fractures. IMPRESSION: 1. Mildly improved left lower lobe atelectasis or pneumonia. 2. Interval mild pulmonary vascular congestion. 3. Stable cardiomegaly and chronic interstitial lung disease. Electronically Signed   By: Beckie SaltsSteven  Reid M.D.   On: 11/14/2017 09:44     Medications:   . sodium chloride Stopped (11/10/17 0600)  . albumin human     . feeding supplement (ENSURE ENLIVE)  237 mL Oral BID BM  . folic acid  1 mg Oral Daily  . heparin injection (subcutaneous)  5,000 Units Subcutaneous Q8H  . insulin aspart  0-15 Units Subcutaneous Q4H  . ipratropium-albuterol  3 mL Nebulization TID  . lactulose  10 g Oral TID  . mouth rinse  15 mL Mouth Rinse BID  . midodrine  10 mg Oral TID WC  . octreotide  200 mcg Subcutaneous BID  . QUEtiapine  25 mg Oral QHS  . rifaximin  550 mg Oral BID  . thiamine  100 mg Oral Daily   sodium chloride, albuterol,  HYDROmorphone  Assessment/ Plan:   Acute kidney injury  Thought to be ATN although certainly would fit with hepatorenal syndrome - we are trying midodrine octreotide and albumin  He has acute alcoholic cirrhosis  No indication for dialysis at this point  Will continue to follow  Anemia   --  Hb 9.3  Follow with critical care   Check iron stores  Cirrhosis  -   INR 1.45   Lactulose     Encephalopathy   Waxes and wanes   Very challenging situation Discussed with father and outlined poor prognosis  With anuric renal failure  It does not appear that dialysis would be indicated at some point  Consider dialysis if no improvement in AM    LOS: 8 Jahni Nazar W @TODAY @9 :28 AM

## 2017-11-15 NOTE — Progress Notes (Signed)
PROGRESS NOTE    [name]  ZOX:[id number] DOB: 1960-11-14 DOA: 11/07/2017 PCP: Dettinger, Elige Radon, MD   Brief Narrative:  57 year old WM PMHx Diabetes type II uncontrolled with complication, Parkinson, EtOH abuse (continuing to drink), Compression fractures  He  has a past medical history of Chronic lower back pain, Compression fracture, Diabetes mellitus, Fracture acetabulum-closed (HCC) (04/09/2013), GERD (gastroesophageal reflux disease), H/O Legionnaire's disease, Hepatomegaly, Migraine, Osteoporosis, Pancreatitis, PTSD (post-traumatic stress disorder), and Vitamin D deficiency.     Presented to the ED with 2 weeks of increasing edema abdominal swelling and lethargy.  Hypoglycemic due to ongoing insulin use. Intubated for airway protection.  Hypotension.  Treated empirically for presumed SBP although quantity of ascites was not sufficient to tap.  Started on lactulose.   Patient compliance with mechanical ventilation initially difficult due to chronic opioid use.  Patient's mentation began to improve.  Was successfully extubated 3/20.   Patient has developed acute renal failure and has been seen by nephrology anticipates the need for renal replacement therapy.    Subjective: 3/24 A/O x2 (does not know when, why).  Negative CP, negative S OB, negative abdominal pain, negative N/V.  Positive back pain.  Per wife has had 3 surgeries secondary to fractures of his vertebrae. Last 24 hours hypothermic 36.4 degree C.   Assessment & Plan:   Active Problems:   Hypoxia   Liver failure (HCC)   EtOH abuse -Folate 1 g daily - Thiamine 100 mg daily - Patient outside window for acute alcohol withdrawal no need for CIWA scale  EtOH Liver Cirrhosis -Patient heavy alcohol user.  Admits to drinking at least 1 bottle of wine per day more than likely drinks much more considering his present condition. -Monitor closely - Rifaximin 550 mg BID + lactulose PRN -Patient abdomen remains  distended ascites?  On 3/20 renal ultrasound showed only small ascites, therefore distention most likely secondary to worsening hepatorenal syndrome--> anasarca  Hepatic Encephalopathy -3/24 ammonia level increased today = 122,  - Increase Lactulose 20 g TID   Acute Renal Failure vs Hepatorenal syndrome. -Nephrology on board -Per nephrology HD NOT indicated at this time -Per nephrology recommendation started octreotide + Midodrine + Albumin  Recent Labs  Lab 11/09/17 0420 11/10/17 0458 11/11/17 0344 11/12/17 0358 11/13/17 0352 11/14/17 0625 11/15/17 0336  CREATININE 1.61* 2.71* 3.50* 5.12* 5.81* 6.82* 7.74*  - Creatinine has not plateaued continues to worsen.  Per Dr. Daphine Deutscher with Nephrology note 3/24 patient's renal function continues to worsen will initiate HD on 3/25.   Acute respiratory failure with hypoxia/HCAP? -Resolved - 3/24 PCXR: Shows possible pneumonia see results below - Titrate O2 to maintain SPO2> 93%   Chronic pain syndrome - Judicious use of sedating medication given patient's cognitive change.  L3 Compression fracture -Review of EMR shows patient was last seen by Dr. Coletta Memos [facility] on 07/16/2015 for L3 compression fracture S/P kyphoplasty  Leukocytosis -SBP?  Already received 1 week of coverage (ceftriaxone) -Given patient's continued leukocytosis, hypothermia, high morbidity will initiate SBP coverage.  Discussed case with pharmacy and will initially start with Zosyn until we can R/O additional source of infection.  Will then de-escalate  Exopthalmosis - TSH pending    Goals of care - PALLIATIVE CARE: Patient EtOH abuse, acute hepatorenal syndrome, not certain he is interested in EtOH rehab program.  Address CODE STATUS (should be DNR), short-term vs long-term goals of care    DVT prophylaxis: Subcu heparin Code Status: Full Family Communication: Family (parents, wife, daughter)  present at bedside for discussion of plan of  care Disposition Plan: Awaiting input from palliative care   Consultants:  P[facility]    Procedures/Significant Events:  3/16 PCXR:- Orthopedic fixation hardware in the sternum incidentally noted.-Multiple old healed fractures of the ribs bilateral 3/20 renal ultrasound- normal 3/23 PCXR:-Mildly improved left lower lobe atelectasis or pneumonia. -Interval mild pulmonary vascular congestion.- Stable cardiomegaly and chronic interstitial lung disease.       I have personally reviewed and interpreted all radiology studies and my findings are as above.  VENTILATOR SETTINGS:      Cultures 3/16 MRSA by PCR negative 3/16 blood negative 3/17 urine negative 3/17 Tracheal aspirate normal flora 3/20 urine positive multiple species 3/24 urine pending 3/24 blood pending     Antimicrobials: Anti-infectives (From admission, onward)   Start     Stop   11/15/17 1400  piperacillin-tazobactam (ZOSYN) IVPB 3.375 g         11/11/17 2200  rifaximin (XIFAXAN) tablet 550 mg         11/08/17 2200  vancomycin (VANCOCIN) 1,500 mg in sodium chloride 0.9 % 500 mL IVPB  Status:  Discontinued     11/09/17 1210   11/08/17 0900  vancomycin (VANCOCIN) 2,000 mg in sodium chloride 0.9 % 500 mL IVPB     11/08/17 1200   11/07/17 1800  cefTRIAXone (ROCEPHIN) 2 g in sodium chloride 0.9 % 100 mL IVPB     11/14/17 1759       Devices    LINES / TUBES:      Continuous Infusions: . sodium chloride Stopped (11/10/17 0600)  . albumin human       Objective: Vitals:   11/15/17 0500 11/15/17 0600 11/15/17 0700 11/15/17 0800  BP: 100/65 109/61 104/80 111/72  Pulse: 64 67  66  Resp: 16 17 13  (!) 23  Temp:      TempSrc:      SpO2: 93% 91%  91%  Weight: 194 lb 7.1 oz (88.2 kg)     Height:        Intake/Output Summary (Last 24 hours) at 11/15/2017 0946 Last data filed at 11/15/2017 0600 Gross per 24 hour  Intake 337 ml  Output 10 ml  Net 327 ml   Filed Weights    11/13/17 0327 11/14/17 0500 11/15/17 0500  Weight: 202 lb 13.2 oz (92 kg) 206 lb 2.1 oz (93.5 kg) 194 lb 7.1 oz (88.2 kg)    Physical Exam:  General: A/O x2 (does not know when, why) No acute respiratory distress Eyes: negative scleral hemorrhage, negative anisocoria, positive icterus, positive Exophthalmos Neck:  Negative scars, masses, torticollis, lymphadenopathy, JVD Lungs: Clear to auscultation bilaterally without wheezes or crackles Cardiovascular: Regular rate and rhythm without murmur gallop or rub normal S1 and S2 Abdomen: Morbidly obese, negative abdominal pain, positive distention, positive soft, bowel sounds, no rebound, no ascites, no appreciable mass.  Pain to palpation mid back consistent with known back surgeries. Extremities: anasarca Skin: Positive jaundice Psychiatric: Unable to fully evaluate secondary to patient's altered mental status.  Central nervous system:  Cranial nerves II through XII intact, tongue/uvula midline, all extremities muscle strength 5/5, sensation intact throughout,, negative dysarthria, negative expressive aphasia, negative receptive aphasia.    .     Data Reviewed: Care during the described time interval was provided by me .  I have reviewed this patient's available data, including medical history, events of note, physical examination, and all test results as part of my evaluation.  CBC: Recent Labs  Lab 11/10/17 0458 11/11/17 0344 11/12/17 0358 11/13/17 0352 11/14/17 0625 11/15/17 0336  WBC 13.8* 15.2* 18.0* 20.0* 20.0* 17.9*  NEUTROABS 10.8* 11.8* 13.6*  --  15.2* 13.9*  HGB 10.6* 10.3* 10.2* 10.4* 10.2* 9.3*  HCT 32.8* 32.8* 31.9* 31.7* 32.0* 29.1*  MCV 112.7* 114.3* 113.1* 112.4* 114.3* 117.3*  PLT 101* 92* 85* 88* 110* 101*   Basic Metabolic Panel: Recent Labs  Lab 11/09/17 0420 11/09/17 1739 11/10/17 0458 11/10/17 1745 11/11/17 0344 11/12/17 0358 11/13/17 0352 11/14/17 0625 11/15/17 0336  NA 131*  --  133*  --  137  139 136 136 139  K 3.9  --  3.8  --  3.6 3.4* 4.3 4.3 4.1  CL 98*  --  99*  --  102 104 103 99* 105  CO2 24  --  22  --  22 21* 18* 20* 21*  GLUCOSE 146*  --  136*  --  185* 90 116* 121* 109*  BUN 10  --  15  --  21* 32* 41* 41* 50*  CREATININE 1.61*  --  2.71*  --  3.50* 5.12* 5.81* 6.82* 7.74*  CALCIUM 6.7*  --  7.1*  --  7.6* 8.6* 8.8* 8.8* 9.0  MG 1.7 2.2 2.4 2.2  --   --   --   --  2.8*  PHOS  --  4.5 5.2* 4.5  --   --   --   --   --    GFR: Estimated Creatinine Clearance: 11.6 mL/min (A) (by C-G formula based on SCr of 7.74 mg/dL (H)). Liver Function Tests: Recent Labs  Lab 11/09/17 0420 11/11/17 0344 11/12/17 0358 11/13/17 0352 11/13/17 0805 11/15/17 0336  AST 118* 81* 79* 103*  --  94*  ALT 44 36 32 32  --  38  ALKPHOS 321* 324* 262* 252*  --  207*  BILITOT 12.7* 12.7* 15.4* QUANTITY NOT SUFFICIENT, UNABLE TO PERFORM TEST 14.6* 13.1*  PROT 5.2* 5.8* 6.0* 5.7*  --  5.9*  ALBUMIN 1.6* 1.9* 2.2* 2.2*  --  2.5*   No results for input(s): LIPASE, AMYLASE in the last 168 hours. Recent Labs  Lab 11/09/17 0420 11/10/17 0458 11/14/17 0625 11/15/17 0336  AMMONIA 140* 136* 82* 122*   Coagulation Profile: Recent Labs  Lab 11/09/17 0420 11/14/17 0625 11/15/17 0336  INR 1.60 1.35 1.45   Cardiac Enzymes: No results for input(s): CKTOTAL, CKMB, CKMBINDEX, TROPONINI in the last 168 hours. BNP (last 3 results) No results for input(s): PROBNP in the last 8760 hours. HbA1C: No results for input(s): HGBA1C in the last 72 hours. CBG: Recent Labs  Lab 11/14/17 1530 11/14/17 2033 11/14/17 2347 11/15/17 0406 11/15/17 0746  GLUCAP 126* 116* 123* 105* 118*   Lipid Profile: No results for input(s): CHOL, HDL, LDLCALC, TRIG, CHOLHDL, LDLDIRECT in the last 72 hours. Thyroid Function Tests: No results for input(s): TSH, T4TOTAL, FREET4, T3FREE, THYROIDAB in the last 72 hours. Anemia Panel: No results for input(s): VITAMINB12, FOLATE, FERRITIN, TIBC, IRON, RETICCTPCT in the  last 72 hours. Urine analysis:    Component Value Date/Time   COLORURINE YELLOW 11/11/2017 1205   APPEARANCEUR TURBID (A) 11/11/2017 1205   LABSPEC 1.025 11/11/2017 1205   PHURINE 6.5 11/11/2017 1205   GLUCOSEU NEGATIVE 11/11/2017 1205   HGBUR LARGE (A) 11/11/2017 1205   BILIRUBINUR LARGE (A) 11/11/2017 1205   BILIRUBINUR n 02/28/2011 1247   KETONESUR NEGATIVE 11/11/2017 1205   PROTEINUR >300 (A) 11/11/2017 1205  UROBILINOGEN 1.0 01/17/2015 1305   NITRITE NEGATIVE 11/11/2017 1205   LEUKOCYTESUR MODERATE (A) 11/11/2017 1205   Sepsis Labs: @LABRCNTIP (procalcitonin:4,lacticidven:4)  ) Recent Results (from the past 240 hour(s))  Culture, blood (routine x 2)     Status: None   Collection Time: 11/07/17  5:55 PM  Result Value Ref Range Status   Specimen Description BLOOD RIGHT HAND  Final   Special Requests   Final    BOTTLES DRAWN AEROBIC AND ANAEROBIC Blood Culture adequate volume   Culture   Final    NO GROWTH 5 DAYS Performed at [facility], [address] [place], [place] [address]    Report Status 11/12/2017 FINAL  Final  Culture, blood (routine x 2)     Status: None   Collection Time: 11/07/17  6:40 PM  Result Value Ref Range Status   Specimen Description BLOOD RIGHT HAND  Final   Special Requests IN PEDIATRIC BOTTLE Blood Culture adequate volume  Final   Culture   Final    NO GROWTH 5 DAYS Performed at [facility], [address], [place], [place] [address]    Report Status 11/12/2017 FINAL  Final  MRSA PCR Screening     Status: None   Collection Time: 11/07/17  8:29 PM  Result Value Ref Range Status   MRSA by PCR NEGATIVE NEGATIVE Final    Comment:        The GeneXpert MRSA Assay (FDA approved for NASAL specimens only), is one component of a comprehensive MRSA colonization surveillance program. It is not intended to diagnose MRSA infection nor to guide or monitor treatment for MRSA infections. Performed at [facility], [address], [place], [place] [address]   Urine Culture     Status: None   Collection Time: 11/08/17  2:09 AM  Result Value Ref Range Status   Specimen Description URINE, RANDOM  Final   Special Requests NONE  Final   Culture   Final    NO GROWTH Performed at [facility], [address] [place], [place] [address]    Report Status 11/09/2017 FINAL  Final  Culture, respiratory (NON-Expectorated)     Status: None   Collection Time: 11/08/17  5:05 AM  Result Value Ref Range Status   Specimen Description TRACHEAL ASPIRATE  Final   Special Requests NONE  Final   Gram Stain   Final    ABUNDANT WBC PRESENT,BOTH PMN AND MONONUCLEAR RARE SQUAMOUS EPITHELIAL CELLS PRESENT ABUNDANT GRAM POSITIVE COCCI IN PAIRS IN CLUSTERS ABUNDANT GRAM NEGATIVE COCCI IN PAIRS RARE GRAM POSITIVE RODS    Culture   Final    MODERATE Consistent with normal respiratory flora. Performed at [facility], [address] [place], [place] [address]    Report Status 11/10/2017 FINAL  Final  Urine Culture     Status: Abnormal   Collection Time: 11/11/17  4:27 PM  Result Value Ref Range Status   Specimen Description URINE, CATHETERIZED  Final   Special Requests   Final    NONE Performed at [facility], [address], [place], [place] [address]    Culture MULTIPLE SPECIES PRESENT, SUGGEST RECOLLECTION (A)  Final   Report Status 11/12/2017 FINAL  Final         Radiology Studies: Dg Chest Port 1 View  Result Date: 11/14/2017 CLINICAL DATA:  Hypoxia. EXAM: PORTABLE CHEST 1 VIEW COMPARISON:  11/11/2017.  Chest CT dated 10/16/2017. FINDINGS: Stable enlarged cardiac  silhouette and prominence of the interstitial markings. Increased prominence of the pulmonary vasculature. Mild decrease in patchy opacity at the left lung base. Stable sternal plates and screws. Previously demonstrated old bilateral rib fractures. IMPRESSION: 1. Mildly improved left lower lobe atelectasis or pneumonia. 2. Interval mild  pulmonary vascular congestion. 3. Stable cardiomegaly and chronic interstitial lung disease. Electronically Signed   By: Beckie Salts M.D.   On: 11/14/2017 09:44        Scheduled Meds: . feeding supplement (ENSURE ENLIVE)  237 mL Oral BID BM  . folic acid  1 mg Oral Daily  . heparin injection (subcutaneous)  5,000 Units Subcutaneous Q8H  . insulin aspart  0-15 Units Subcutaneous Q4H  . ipratropium-albuterol  3 mL Nebulization TID  . lactulose  10 g Oral TID  . mouth rinse  15 mL Mouth Rinse BID  . midodrine  10 mg Oral TID WC  . octreotide  200 mcg Subcutaneous BID  . QUEtiapine  25 mg Oral QHS  . rifaximin  550 mg Oral BID  . thiamine  100 mg Oral Daily   Continuous Infusions: . sodium chloride Stopped (11/10/17 0600)  . albumin human       LOS: 8 days    Time spent: 40 minutes    Jenetta Wease, Roselind Messier, MD [facility] Pager [phone]   If 7PM-7AM, please contact night-coverage [email or web address] Password [facility] 11/15/2017, 9:46 AM

## 2017-11-15 NOTE — Progress Notes (Signed)
  Speech Language Pathology Treatment: Dysphagia  Patient Details Name: [name] MRN: [id number] DOB: 10/30/1960 Today's Date: 11/15/2017 Time: 1025-1040 SLP Time Calculation (min) (ACUTE ONLY): 15 min  Assessment / Plan / Recommendation Clinical Impression  [name] present and feeding pt dys 2/honey thick liquids in semi-reclined position upon SLP entry to room. SLP provided education re: aspiration risks and after ensuring oral/pharyngeal clearance assisted with repositioning and demonstrated safest position to consume POs. RN reports mentation has improved, and pt is no longer in restraints; dys 2/ honey diet orders remain in place, but have been holding trays when pt is not alert. SLP assisted pt with several bites of solids from his breakfast, followed by assisted cup sips of honey thick liquids, with no overt signs of aspiration noted. Pt is distractible and requires moderate cues to maintain attention to POs. After several bites, pt with change in level of alertness, closing his eyes and no longer following commands. Wife requested if she should resume feeding pt. SLP educated re: holding POs when pt is not fully alert, citing aspiration risks. She verbalized understanding. [name] also present and affirms they will ensure pt sits upright and is alert if giving POs. May continue dys 2, honey-thick liquids, full supervision and pt must be fully alert throughout feeding. Continue to hold trays if lethargic or if pt is exhibiting overt signs of aspiration; RN aware. Recommend instrumental testing (MBS) to further assess safety with prescribed diet as well as determine possibility for advancement, however do not recommend this date given lethargy. Will follow up next date.    HPI HPI: Pt is a 57 yo male who presented with decompensated cirrhosis with hepatic encephalopathy. PMHx includes DM, PTSD, compression fx's and pain/ debilitation, GERD, EtOH abuse with cirrhosis. Intubated  3/17-3/20, now on 1L nasal canula. Pt recently hospitalized in Feb 2019 for flu. Previously seen by SLP in 2016 following intubation, with FEES revealing reversible dysphagia an dpt was eventually d/c from SLP on regular solid and thin liquid diet.       SLP Plan  Continue with current plan of care       Recommendations  Diet recommendations: Dysphagia 2 (fine chop);Honey-thick liquid Liquids provided via: Cup;Teaspoon Medication Administration: Crushed with puree Supervision: Staff to assist with self feeding Compensations: Slow rate;Small sips/bites;Minimize environmental distractions Postural Changes and/or Swallow Maneuvers: Seated upright 90 degrees                Oral Care Recommendations: Oral care before and after PO Follow up Recommendations: Other (comment)(tba) SLP Visit Diagnosis: Dysphagia, unspecified (R13.10) Plan: Continue with current plan of care       GO               Rondel BatonMary Beth Bayron Dalto, MS, CCC-SLP Speech-Language Pathologist [phone]  [name] LindauMary E Jiaire [name] 11/15/2017, 11:23 AM

## 2017-11-15 NOTE — Consult Note (Signed)
Consultation Note Date: 11/15/2017   Patient Name: [name]  DOB: [birth year]  MRN: [id number]  Age / Sex: 57 y.o., male  PCP: Dettinger, Fransisca Kaufmann, MD Referring Physician: Allie Bossier, MD  Reason for Consultation: Establishing goals of care and Psychosocial/spiritual support  HPI/Patient Profile: 57 y.o. male  with past medical history of DM, PTSD, chronic pain 2/2 comp FX's, sciatica, ETOH use D/O admitted on 11/07/2017 with abdominal pain, increased LE edema, hypoglycemia. Pt was intubated for airway protection. He now has worsening renal function, possibly hepatorenal syndrome.He is now extubated. He is now close to being oliguric. His hospital coarse has been complicated not only by worsening renal function but delirium requiring restraints. He is now out of restraints and more lucid.  Consult ordered for [place].   Clinical Assessment and Goals of Care: Pt seen, chart reviewed. Pt is able to speak to me this am but still would require a helath care proxy to help him with advance healthcare decision making. He tells me his wife [name], is his main source of support. His father and sister are in to visit as well. His creatinine today is 7.74, ammonia 122, albumin 2.5  Pt has been seen by nephrology services with initial impression that dialysis may not be beneficial . Pt started on octreotide, albumin and midodrine in the interim  HCPOA, wife [name] [phone]; [name] sister, [phone]  Met with patient's wife and sister outside of the room.  We had extensive conversations regarding pathophysiology of liver failure with now associated kidney failure.  We did address best as well as worst-case scenarios: Best scenario would be a very uphill climb in terms of working towards a liver transplant, documented sobriety, medication compliance; worse case scenario that patient is critically  ill and may not survive this hospitalization. We discussed candidly with patient be able to pursue sobriety and handle this amount of medical interventions in his life. So addressed upcoming possibility of dialysis.  Reviewed with family types of dialysis, CRRT versus HD. Address CODE STATUS, and defined terms of full code as well as DNR    SUMMARY OF RECOMMENDATIONS   Continue with full scope of treatment, full code Family in agreement to see how patient tolerates dialysis Family gaining knowledge in terms of severity of patient's illness, disease trajectory Began introduction of types of decisions that families and patient's face in the setting of critical illness Palliative medicine medicine to stay involved to be an additional resource and source of support Code Status/Advance Care Planning:  Full code    Symptom Management:   Pain: Pt has chronic back pain. Cont with hydromorphone 56m PO q6 hr PRN  Agitation: Cont with Seroquel qHS. Monitor and tirate for effect  Palliative Prophylaxis:   Aspiration, Bowel Regimen, Delirium Protocol, Eye Care, Frequent Pain Assessment, Oral Care and Turn Reposition  Additional Recommendations (Limitations, Scope, Preferences):  Full Scope Treatment  Psycho-social/Spiritual:   Desire for further Chaplaincy support:no  Prognosis:   Unable to determine  Discharge Planning: To  Be Determined      Primary Diagnoses: Present on Admission: . Liver failure ([place])   I have reviewed the medical record, interviewed the patient and family, and examined the patient. The following aspects are pertinent.  Past Medical History:  Diagnosis Date  . Chronic lower back pain   . Compression fracture    lumbar 3  . Diabetes mellitus   . Fracture acetabulum-closed ([place]) 04/09/2013  . GERD (gastroesophageal reflux disease)   . H/O Legionnaire's disease   . Hepatomegaly   . Migraine   . Osteoporosis   . Pancreatitis   . PTSD (post-traumatic  stress disorder)   . Vitamin D deficiency    Social History   Socioeconomic History  . Marital status: Married    Spouse name: Not on file  . Number of children: Not on file  . Years of education: Not on file  . Highest education level: Not on file  Occupational History  . Not on file  Social Needs  . Financial resource strain: Not on file  . Food insecurity:    Worry: Not on file    Inability: Not on file  . Transportation needs:    Medical: Not on file    Non-medical: Not on file  Tobacco Use  . Smoking status: Former Smoker    Packs/day: 0.50    Years: 26.00    Pack years: 13.00    Types: Cigarettes    Last attempt to quit: 08/26/2007    Years since quitting: 10.2  . Smokeless tobacco: Never Used  Substance and Sexual Activity  . Alcohol use: Yes    Alcohol/week: 3.6 oz    Types: 6 Glasses of wine per week    Comment: 6 oz x 6 glass of wine daily   . Drug use: No  . Sexual activity: Not on file  Lifestyle  . Physical activity:    Days per week: Not on file    Minutes per session: Not on file  . Stress: Not on file  Relationships  . Social connections:    Talks on phone: Not on file    Gets together: Not on file    Attends religious service: Not on file    Active member of club or organization: Not on file    Attends meetings of clubs or organizations: Not on file    Relationship status: Not on file  Other Topics Concern  . Not on file  Social History Narrative  . Not on file   Family History  Problem Relation Age of Onset  . Cancer Unknown        breast/grandmother, prostate/grandfather   Scheduled Meds: . feeding supplement (ENSURE ENLIVE)  237 mL Oral BID BM  . folic acid  1 mg Oral Daily  . heparin injection (subcutaneous)  5,000 Units Subcutaneous Q8H  . insulin aspart  0-15 Units Subcutaneous Q4H  . ipratropium-albuterol  3 mL Nebulization TID  . lactulose  10 g Oral TID  . mouth rinse  15 mL Mouth Rinse BID  . midodrine  10 mg Oral TID WC  .  octreotide  200 mcg Subcutaneous BID  . QUEtiapine  25 mg Oral QHS  . rifaximin  550 mg Oral BID  . thiamine  100 mg Oral Daily   Continuous Infusions: . sodium chloride Stopped (11/10/17 0600)  . albumin human     PRN Meds:.sodium chloride, albuterol, HYDROmorphone Medications Prior to Admission:  Prior to Admission medications   Medication Sig Start  Date End Date Taking? Authorizing Provider  albuterol (PROVENTIL HFA;VENTOLIN HFA) 108 (90 Base) MCG/ACT inhaler Inhale 2 puffs into the lungs every 6 (six) hours as needed for wheezing or shortness of breath. 10/16/17  Yes Terald Sleeper, PA-C  carboxymethylcellulose (REFRESH PLUS) 0.5 % SOLN Place 1 drop into both eyes 3 (three) times daily as needed (dry eyes).   Yes [provider]  gabapentin (NEURONTIN) 400 MG capsule Take 3 capsules (1,200 mg total) by mouth 3 (three) times daily. 10/22/17  Yes Dettinger, Fransisca Kaufmann, MD  guaiFENesin (MUCINEX) 600 MG 12 hr tablet Take 2 tablets (1,200 mg total) by mouth 2 (two) times daily. 10/18/17  Yes Mariel Aloe, MD  hydrochlorothiazide (HYDRODIURIL) 25 MG tablet Take 0.5 tablets (12.5 mg total) by mouth 2 (two) times daily. 10/22/17  Yes Dettinger, Fransisca Kaufmann, MD  ibuprofen (ADVIL,MOTRIN) 200 MG tablet Take 600 mg by mouth every 6 (six) hours as needed (back pain).   Yes [provider]  insulin aspart (NOVOLOG FLEXPEN) 100 UNIT/ML FlexPen INJECT 7 TO 15 UNITS subcu up to 4 times daily Patient taking differently: INJECT 9 - 17 UNITS SQ UP TO FOUR TIMES A DAY 10/23/17  Yes Dettinger, Fransisca Kaufmann, MD  Insulin Glargine (LANTUS SOLOSTAR) 100 UNIT/ML Solostar Pen Inject 37 Units into the skin daily. Patient taking differently: Inject 33 Units into the skin daily.  10/22/17  Yes Dettinger, Fransisca Kaufmann, MD  Magnesium 500 MG TABS Take 500 mg by mouth daily.   Yes [provider]  methocarbamol (ROBAXIN) 500 MG tablet Take 500 mg by mouth 2 (two) times daily as needed for muscle spasms.   Yes  [provider]  morphine (MSIR) 15 MG tablet Take 15 mg by mouth 3 (three) times daily.    Yes [provider]  pantoprazole (PROTONIX) 40 MG tablet Take 1 tablet (40 mg total) by mouth daily. 10/22/17  Yes Dettinger, Fransisca Kaufmann, MD  polyethylene glycol (MIRALAX / GLYCOLAX) packet Take 17 g by mouth 2 (two) times daily. 12/01/16  Yes Reyne Dumas, MD  POTASSIUM PO Take 1 tablet by mouth at bedtime as needed (leg cramps).   Yes [provider]  Blood Glucose Monitoring Suppl (ACCU-CHEK AVIVA PLUS) w/Device KIT 1 each by Does not apply route daily. 10/23/17   Dettinger, Fransisca Kaufmann, MD  Glucose Blood (BLOOD GLUCOSE TEST STRIPS) STRP 1 strip by In Vitro route 4 (four) times daily. 10/23/17   Dettinger, Fransisca Kaufmann, MD   Allergies  Allergen Reactions  . Ketorolac Tromethamine Hives  . Temsirolimus Other (See Comments)    Unknown - Pt does not recognize this medication [facility]   Review of Systems  Unable to perform ROS: Mental status change    Physical Exam  Constitutional:  Acutely ill appearing man; out of restraints, calm   HENT:  Head: Normocephalic and atraumatic.  Eyes: Scleral icterus is present.  Neck: Normal range of motion.  Cardiovascular: Normal rate.  Pulmonary/Chest: Effort normal.  Abdominal: He exhibits distension.  ascites  Musculoskeletal: Normal range of motion.  Neurological: He is alert.  Oriented to self and that he is in the hospital. Able to tell me wife's name, address  Skin: Skin is warm and dry.  jaundiced  Psychiatric:  Pt is calm. Speaking in sentences Thought processes fairly organized  Nursing note and vitals reviewed.   Vital Signs: BP 111/72 (BP Location: Right Arm)   Pulse 66   Temp (!) 97 F (36.1 C) (Oral)  Resp (!) 23   Ht _0  (1.727 m)   Wt 88.2 kg (194 lb 7.1 oz)   SpO2 91%   BMI 29.57 kg/m  Pain Scale: 0-10 POSS *See Group Information*: 2-Acceptable,Slightly drowsy, easily aroused Pain Score:  Asleep   SpO2: SpO2: 91 % O2 Device:SpO2: 91 % O2 Flow Rate: .O2 Flow Rate (L/min): 4 L/min  IO: Intake/output summary:   Intake/Output Summary (Last 24 hours) at 11/15/2017 0853 Last data filed at 11/15/2017 0600 Gross per 24 hour  Intake 337 ml  Output 10 ml  Net 327 ml    LBM: Last BM Date: 11/14/17 Baseline Weight: Weight: 88.9 kg (195 lb 15.8 oz) Most recent weight: Weight: 88.2 kg (194 lb 7.1 oz)     Palliative Assessment/Data:   Flowsheet Rows     Most Recent Value  Intake Tab  Referral Department  Hospitalist  Unit at Time of Referral  ICU  Palliative Care Primary Diagnosis  Other (Comment)  Date Notified  11/13/17  Date of Admission  11/07/17  Date first seen by Palliative Care  11/15/17  # of days Palliative referral response time  2 Day(s)  # of days IP prior to Palliative referral  6  Clinical Assessment  Palliative Performance Scale Score  40%  Pain Max last 24 hours  Not able to report  Pain Min Last 24 hours  Not able to report  Dyspnea Max Last 24 Hours  Not able to report  Dyspnea Min Last 24 hours  Not able to report  Nausea Max Last 24 Hours  Not able to report  Nausea Min Last 24 Hours  Not able to report  Anxiety Max Last 24 Hours  Not able to report  Anxiety Min Last 24 Hours  Not able to report  Other Max Last 24 Hours  Not able to report  Psychosocial & Spiritual Assessment  Palliative Care Outcomes  Patient/Family meeting held?  Yes  Who was at the meeting?  pt and wife  Palliative Care follow-up planned  Yes, Facility      Time In: 1000 Time Out: 1110 Time Total: 70 min Greater than 50%  of this time was spent counseling and coordinating care related to the above assessment and plan.  Signed by: Dory Horn, NP   Please contact Palliative Medicine Team phone at [phone] for questions and concerns.  For individual provider: See Shea Evans

## 2017-11-15 NOTE — Plan of Care (Signed)
  Problem: Elimination: Goal: Will not experience complications related to bowel motility Outcome: Progressing   

## 2017-11-16 ENCOUNTER — Inpatient Hospital Stay (HOSPITAL_COMMUNITY): Payer: Medicare HMO

## 2017-11-16 DIAGNOSIS — N179 Acute kidney failure, unspecified: Secondary | ICD-10-CM

## 2017-11-16 DIAGNOSIS — G9340 Encephalopathy, unspecified: Secondary | ICD-10-CM

## 2017-11-16 DIAGNOSIS — E162 Hypoglycemia, unspecified: Secondary | ICD-10-CM

## 2017-11-16 LAB — CBC WITH DIFFERENTIAL/PLATELET
BASOS ABS: 0.2 10*3/uL — AB (ref 0.0–0.1)
Basophils Relative: 1 %
EOS PCT: 2 %
Eosinophils Absolute: 0.4 10*3/uL (ref 0.0–0.7)
HEMATOCRIT: 30.5 % — AB (ref 39.0–52.0)
HEMOGLOBIN: 9.4 g/dL — AB (ref 13.0–17.0)
LYMPHS PCT: 8 %
Lymphs Abs: 1.6 10*3/uL (ref 0.7–4.0)
MCH: 36.2 pg — ABNORMAL HIGH (ref 26.0–34.0)
MCHC: 30.8 g/dL (ref 30.0–36.0)
MCV: 117.3 fL — ABNORMAL HIGH (ref 78.0–100.0)
Monocytes Absolute: 1.6 10*3/uL — ABNORMAL HIGH (ref 0.1–1.0)
Monocytes Relative: 8 %
NEUTROS ABS: 16.4 10*3/uL — AB (ref 1.7–7.7)
Neutrophils Relative %: 81 %
Platelets: 60 10*3/uL — ABNORMAL LOW (ref 150–400)
RBC: 2.6 MIL/uL — AB (ref 4.22–5.81)
RDW: 18.8 % — ABNORMAL HIGH (ref 11.5–15.5)
WBC: 20.2 10*3/uL — AB (ref 4.0–10.5)

## 2017-11-16 LAB — GLUCOSE, CAPILLARY
GLUCOSE-CAPILLARY: 114 mg/dL — AB (ref 65–99)
GLUCOSE-CAPILLARY: 121 mg/dL — AB (ref 65–99)
GLUCOSE-CAPILLARY: 140 mg/dL — AB (ref 65–99)
GLUCOSE-CAPILLARY: 97 mg/dL (ref 65–99)
Glucose-Capillary: 118 mg/dL — ABNORMAL HIGH (ref 65–99)
Glucose-Capillary: 239 mg/dL — ABNORMAL HIGH (ref 65–99)
Glucose-Capillary: 167 mg/dL — ABNORMAL HIGH (ref 65–99)

## 2017-11-16 LAB — POCT I-STAT 3, ART BLOOD GAS (G3+)
Acid-base deficit: 8 mmol/L — ABNORMAL HIGH (ref 0.0–2.0)
Bicarbonate: 19.9 mmol/L — ABNORMAL LOW (ref 20.0–28.0)
O2 SAT: 94 %
PCO2 ART: 46.9 mmHg (ref 32.0–48.0)
Patient temperature: 97
TCO2: 21 mmol/L — AB (ref 22–32)
pH, Arterial: 7.232 — ABNORMAL LOW (ref 7.350–7.450)
pO2, Arterial: 78 mmHg — ABNORMAL LOW (ref 83.0–108.0)

## 2017-11-16 LAB — COMPREHENSIVE METABOLIC PANEL
ALBUMIN: 2.9 g/dL — AB (ref 3.5–5.0)
ALT: 32 U/L (ref 17–63)
AST: 95 U/L — AB (ref 15–41)
Alkaline Phosphatase: 197 U/L — ABNORMAL HIGH (ref 38–126)
Anion gap: 17 — ABNORMAL HIGH (ref 5–15)
BUN: 53 mg/dL — ABNORMAL HIGH (ref 6–20)
CHLORIDE: 103 mmol/L (ref 101–111)
CO2: 18 mmol/L — AB (ref 22–32)
Calcium: 9.1 mg/dL (ref 8.9–10.3)
Creatinine, Ser: 8.75 mg/dL — ABNORMAL HIGH (ref 0.61–1.24)
GFR calc Af Amer: 7 mL/min — ABNORMAL LOW (ref >60)
GFR calc non Af Amer: 6 mL/min — ABNORMAL LOW (ref >60)
GLUCOSE: 144 mg/dL — AB (ref 65–99)
POTASSIUM: 4.7 mmol/L (ref 3.5–5.1)
Sodium: 138 mmol/L (ref 135–145)
Total Bilirubin: 12.3 mg/dL — ABNORMAL HIGH (ref 0.3–1.2)
Total Protein: 6.1 g/dL — ABNORMAL LOW (ref 6.5–8.1)

## 2017-11-16 LAB — PROTIME-INR
INR: 1.48
Prothrombin Time: 17.8 seconds — ABNORMAL HIGH (ref 11.4–15.2)

## 2017-11-16 LAB — AMMONIA: AMMONIA: 96 umol/L — AB (ref 9–35)

## 2017-11-16 LAB — MAGNESIUM: MAGNESIUM: 3 mg/dL — AB (ref 1.7–2.4)

## 2017-11-16 LAB — TSH: TSH: 1.41 u[IU]/mL (ref 0.350–4.500)

## 2017-11-16 MED ORDER — FENTANYL CITRATE (PF) 100 MCG/2ML IJ SOLN
INTRAMUSCULAR | Status: AC
  Filled 2017-11-16: qty 2

## 2017-11-16 MED ORDER — LORAZEPAM 2 MG/ML IJ SOLN
2.0000 mg | Freq: Once | INTRAMUSCULAR | Status: AC
  Administered 2017-11-16: 2 mg via INTRAVENOUS

## 2017-11-16 MED ORDER — HYDROMORPHONE HCL 2 MG PO TABS
1.0000 mg | ORAL_TABLET | Freq: Four times a day (QID) | ORAL | Status: DC | PRN
  Filled 2017-11-16: qty 1

## 2017-11-16 MED ORDER — PRISMASOL BGK 4/2.5 32-4-2.5 MEQ/L IV SOLN
INTRAVENOUS | Status: DC
  Administered 2017-11-16 – 2017-11-19 (×4): via INTRAVENOUS_CENTRAL
  Filled 2017-11-16 (×5): qty 5000

## 2017-11-16 MED ORDER — SODIUM CHLORIDE 0.9% FLUSH: 10.0000 mL | INTRAVENOUS | Status: DC | PRN

## 2017-11-16 MED ORDER — FAMOTIDINE IN NACL 20-0.9 MG/50ML-% IV SOLN
20.0000 mg | INTRAVENOUS | Status: DC
  Administered 2017-11-16 – 2017-11-19 (×4): 20 mg via INTRAVENOUS
  Filled 2017-11-16 (×4): qty 50

## 2017-11-16 MED ORDER — HEPARIN SODIUM (PORCINE) 1000 UNIT/ML DIALYSIS
1000.0000 [IU] | INTRAMUSCULAR | Status: DC | PRN
  Administered 2017-11-20: 2400 [IU] via INTRAVENOUS_CENTRAL
  Filled 2017-11-16: qty 6
  Filled 2017-11-16: qty 5
  Filled 2017-11-16: qty 6

## 2017-11-16 MED ORDER — PRISMASOL BGK 4/2.5 32-4-2.5 MEQ/L IV SOLN
INTRAVENOUS | Status: DC
  Administered 2017-11-16 – 2017-11-20 (×5): via INTRAVENOUS_CENTRAL
  Filled 2017-11-16 (×8): qty 5000

## 2017-11-16 MED ORDER — LORAZEPAM 2 MG/ML IJ SOLN
INTRAMUSCULAR | Status: AC
  Filled 2017-11-16: qty 2

## 2017-11-16 MED ORDER — CHLORHEXIDINE GLUCONATE CLOTH 2 % EX PADS
6.0000 | MEDICATED_PAD | Freq: Every day | CUTANEOUS | Status: DC
  Administered 2017-11-17 – 2017-12-09 (×16): 6 via TOPICAL

## 2017-11-16 MED ORDER — SODIUM CHLORIDE 0.9 % FOR CRRT
INTRAVENOUS_CENTRAL | Status: DC | PRN
  Filled 2017-11-16: qty 1000

## 2017-11-16 MED ORDER — MIDAZOLAM HCL 2 MG/2ML IJ SOLN
INTRAMUSCULAR | Status: AC
  Filled 2017-11-16: qty 2

## 2017-11-16 MED ORDER — ALBUTEROL SULFATE (2.5 MG/3ML) 0.083% IN NEBU: 2.5000 mg | INHALATION_SOLUTION | RESPIRATORY_TRACT | Status: DC | PRN

## 2017-11-16 MED ORDER — FENTANYL CITRATE (PF) 100 MCG/2ML IJ SOLN
50.0000 ug | INTRAMUSCULAR | Status: DC | PRN
  Administered 2017-11-16 – 2017-11-19 (×14): 50 ug via INTRAVENOUS
  Filled 2017-11-16 (×13): qty 2

## 2017-11-16 MED ORDER — PIPERACILLIN-TAZOBACTAM 3.375 G IVPB 30 MIN
3.3750 g | Freq: Four times a day (QID) | INTRAVENOUS | Status: DC
  Administered 2017-11-16 – 2017-11-19 (×11): 3.375 g via INTRAVENOUS
  Filled 2017-11-16 (×12): qty 50

## 2017-11-16 MED ORDER — FENTANYL CITRATE (PF) 100 MCG/2ML IJ SOLN: 50.0000 ug | INTRAMUSCULAR | Status: DC | PRN

## 2017-11-16 MED ORDER — MIDAZOLAM HCL 2 MG/2ML IJ SOLN
INTRAMUSCULAR | Status: AC
  Administered 2017-11-16: 1 mg
  Filled 2017-11-16: qty 2

## 2017-11-16 MED ORDER — ORAL CARE MOUTH RINSE
15.0000 mL | OROMUCOSAL | Status: DC
  Administered 2017-11-16 – 2017-11-19 (×26): 15 mL via OROMUCOSAL

## 2017-11-16 MED ORDER — ETOMIDATE 2 MG/ML IV SOLN
20.0000 mg | Freq: Once | INTRAVENOUS | Status: AC
  Administered 2017-11-16: 20 mg via INTRAVENOUS

## 2017-11-16 MED ORDER — SODIUM CHLORIDE 0.9% FLUSH
10.0000 mL | Freq: Two times a day (BID) | INTRAVENOUS | Status: DC
  Administered 2017-11-17 – 2017-12-01 (×13): 10 mL
  Administered 2017-12-02 – 2017-12-03 (×3): 3 mL
  Administered 2017-12-03 – 2017-12-11 (×10): 10 mL
  Administered 2017-12-11 – 2017-12-12 (×2): 3 mL

## 2017-11-16 MED ORDER — CHLORHEXIDINE GLUCONATE 0.12% ORAL RINSE (MEDLINE KIT)
15.0000 mL | Freq: Two times a day (BID) | OROMUCOSAL | Status: DC
  Administered 2017-11-16 – 2017-11-19 (×6): 15 mL via OROMUCOSAL

## 2017-11-16 MED ORDER — PRISMASOL BGK 4/2.5 32-4-2.5 MEQ/L IV SOLN
INTRAVENOUS | Status: DC
  Administered 2017-11-16 – 2017-11-20 (×37): via INTRAVENOUS_CENTRAL
  Filled 2017-11-16 (×48): qty 5000

## 2017-11-16 NOTE — Progress Notes (Signed)
Paged Dr. Hyman HopesWebb regarding pt needing HD catheter.

## 2017-11-16 NOTE — Procedures (Addendum)
Hemodialysis Insertion Procedure Note Faythe Gheearon P Forlenza [id number] 09/17/1960  Procedure: Insertion of Hemodialysis Catheter Type: 3 port  Indications: Hemodialysis   Procedure Details Consent: Risks of procedure as well as the alternatives and risks of each were explained to the (patient/caregiver).  Consent for procedure obtained. Time Out: Verified patient identification, verified procedure, site/side was marked, verified correct patient position, special equipment/implants available, medications/allergies/relevent history reviewed, required imaging and test results available.  Performed  Very agitated prior to procedure. Discussed with Dr. Sharon SellerMcClung who recommended ativan for sedation. 2mg  administered. Waited 10 minutes and he was still agitated and thrashing around the bed. Gave 1 mg shorter acting versed. Agitation improved, but he did continue to fight throughout procedure. Did not give more sedation for fear of over sedating him.   Maximum sterile technique was used including antiseptics, cap, gloves, gown, hand hygiene, mask and sheet. Skin prep: Chlorhexidine; local anesthetic administered A antimicrobial bonded/coated triple lumen catheter was placed in the right internal jugular vein using the Seldinger technique. Ultrasound guidance used.Yes.   Catheter placed to 15 cm. Blood aspirated via all 3 ports and then flushed x 3. Line sutured x 2 and dressing applied.  Evaluation Blood flow good Complications: No apparent complications Patient did tolerate procedure well. Chest X-ray ordered to verify placement.  CXR: pending.  He is sedated. RN to monitor closely for indications of poor airway protection.   Joneen RoachPaul Hoffman, AGACNP-BC [facility]/Critical Care Pager [phone] or [phone]  11/16/2017 4:45 PM

## 2017-11-16 NOTE — Procedures (Signed)
Intubation Procedure Note [name] [id number] 05/14/1961  Procedure: Intubation Indications: Airway protection and maintenance  Procedure Details Consent: Risks of procedure as well as the alternatives and risks of each were explained to the (patient/caregiver).  Consent for procedure obtained. Time Out: Verified patient identification, verified procedure, site/side was marked, verified correct patient position, special equipment/implants available, medications/allergies/relevent history reviewed, required imaging and test results available.  Performed  Drugs Etomidate DL x 1 with GS 1 blade Grade 1 view 7.5 ET tube passed through cords under direct visualization Placement confirmed with bilateral breath sounds, positive EtCO2 change and smoke in tube   Evaluation Hemodynamic Status: BP stable throughout; O2 sats: stable throughout Patient's Current Condition: stable Complications: No apparent complications Patient did tolerate procedure well. Chest X-ray ordered to verify placement.  CXR: pending.   [name] 11/16/2017

## 2017-11-16 NOTE — Progress Notes (Signed)
  Speech Language Pathology Treatment: Dysphagia  Patient Details Name: [name] MRN: [id number] DOB: 06/10/1961 Today's Date: 11/16/2017 Time: [id number] SLP Time Calculation (min) (ACUTE ONLY): 18 min  Assessment / Plan / Recommendation Clinical Impression  Pt continues to present with AMS and cough at baseline, complicating diet recommendations and further enforcing need for objective swallow assessment to determine pt's oropharyngeal swallow function. Pt had immediate/dealyed cough and min wet vocal quality during limited PO trials of Dys 1 (puree) and Honey-Thick liquid. Pt benefited from Mod verbal cues to ensure alertness to PO and to follow aspiration precautions. [name] and sister in room and reported minimal intake and persistent cough with and without PO intake. Plan per family and discussion with MD is to start dialysis today. Therefore, recommend intstrumental testing following initial rounds of CRRT to see if mentation improves first. Further discussions regarding pt's GOC may also be beneficial. In the mean time, continue to recommend Dys 2 (chopped) and Honey-Thick liquid with aspiration precautions and full supervision to ensure sustained alertness to PO, continuing to hold if overt signs of aspiration are noted.       HPI HPI: Pt is a 57 yo male who presented with decompensated cirrhosis with hepatic encephalopathy. PMHx includes DM, PTSD, compression fx's and pain/ debilitation, GERD, EtOH abuse with cirrhosis. Intubated 3/17-3/20, now on 1L nasal canula. Pt recently hospitalized in Feb 2019 for flu. Previously seen by SLP in 2016 following intubation, with FEES revealing reversible dysphagia an dpt was eventually d/c from SLP on regular solid and thin liquid diet.       SLP Plan  Continue with current plan of care       Recommendations  Diet recommendations: Dysphagia 2 (fine chop);Honey-thick liquid Liquids provided via: Cup;Teaspoon Medication Administration:  Crushed with puree Supervision: Staff to assist with self feeding;Full supervision/cueing for compensatory strategies Compensations: Minimize environmental distractions;Slow rate;Small sips/bites Postural Changes and/or Swallow Maneuvers: Seated upright 90 degrees                Oral Care Recommendations: Oral care BID;Oral care before and after PO Follow up Recommendations: Other (comment)(tbd) SLP Visit Diagnosis: Dysphagia, unspecified (R13.10) Plan: Continue with current plan of care       GO              [name] SLP Student Clinician   [name] 11/16/2017, 11:10 AM

## 2017-11-16 NOTE — Progress Notes (Signed)
[facility] TEAM 1 - Stepdown/ICU TEAM  [name]  NFA:[id number] DOB: [birth year] DOA: 11/07/2017 PCP: Dettinger, Elige Radon, MD    Brief Narrative:  57yo M w/ Hx of uncontrolled DM, Parkinson, ongoing EtOH abuse, chronic pancreatitis, PTSD, GERD, and compression fractures who presented to the ED with 2 weeks of increasing edema,  abdominal swelling, and lethargy.  He was also found to be hypoglycemic (with ongoing insulin use).  After presentation he was intubated for airway protection, and was treated empirically for presumed SBP although quantity of ascites was not sufficient to tap.  His compliance with mechanical ventilation was initially difficult due to chronic opioid use, but he was successfully extubated 3/20.  He has also developed acute renal failure.  Significant Events: 3/16 admit  3/20 renal US - unrevealing    Subjective: Pt is awake but non-communicative / very confused.  He can not provide a reliable hx.  I have spoken to his wife and sister at the bedside.    Assessment & Plan:  EtOH abuse w/ Cirrhosis of the liver - Hepatic Encephalopathy admits to drinking at least 1 bottle of wine per day - on 3/20 renal ultrasound showed only small ascites - cont lactulose   Acute Renal Failure - ATN v/s Hepatorenal syndrome. Nephrology following - tx w/ octreotide + Midodrine + Albumin   Acute respiratory failure with hypoxia Resolved  DM - Hypoglycemia   Chronic pain syndrome Judicious use of sedating medication given patient's cognitive change.  L3 Compression fracture last seen by Dr. Franky Macho Neurosurgery on 07/16/2015 for L3 compression fracture S/P kyphoplasty  Exopthalmosis TSH is normal   Parkinsons   DVT prophylaxis: sub Q heparin  Code Status: FULL CODE Family Communication:  Disposition Plan: SDU   Consultants:  P[facility]  Antimicrobials:  Zosyn 3/24 > Rocephin 3/17 > 3/22  Objective: Blood pressure 92/68, pulse 73,  temperature (!) 97.4 F (36.3 C), temperature source Oral, resp. rate (!) 22, height 5\' 8"  (1.727 m), weight 87.5 kg (192 lb 14.4 oz), SpO2 94 %.  Intake/Output Summary (Last 24 hours) at 11/16/2017 0929 Last data filed at 11/16/2017 4696 Gross per 24 hour  Intake 287 ml  Output 30 ml  Net 257 ml   Filed Weights   11/14/17 0500 11/15/17 0500 11/16/17 0500  Weight: 93.5 kg (206 lb 2.1 oz) 88.2 kg (194 lb 7.1 oz) 87.5 kg (192 lb 14.4 oz)    Examination: General: No acute respiratory distress evident - obtunded  Lungs: poor air movement B bases - no wheezing  Cardiovascular: Regular rate and rhythm without murmur  Abdomen: Nontender, distended and tense, bowel sounds positive, no rebound Extremities: 2+ edema bilateral lower extremities  CBC: Recent Labs  Lab 11/11/17 0344 11/12/17 0358 11/13/17 0352 11/14/17 0625 11/15/17 0336 11/16/17 0401  WBC 15.2* 18.0* 20.0* 20.0* 17.9* 20.2*  NEUTROABS 11.8* 13.6*  --  15.2* 13.9* 16.4*  HGB 10.3* 10.2* 10.4* 10.2* 9.3* 9.4*  HCT 32.8* 31.9* 31.7* 32.0* 29.1* 30.5*  MCV 114.3* 113.1* 112.4* 114.3* 117.3* 117.3*  PLT 92* 85* 88* 110* 101* 60*   Basic Metabolic Panel: Recent Labs  Lab 11/09/17 1739 11/10/17 0458 11/10/17 1745  11/12/17 0358 11/13/17 0352 11/14/17 0625 11/15/17 0336 11/16/17 0401  NA  --  133*  --    < > 139 136 136 139 138  K  --  3.8  --    < > 3.4* 4.3 4.3 4.1 4.7  CL  --  99*  --    < >  104 103 99* 105 103  CO2  --  22  --    < > 21* 18* 20* 21* 18*  GLUCOSE  --  136*  --    < > 90 116* 121* 109* 144*  BUN  --  15  --    < > 32* 41* 41* 50* 53*  CREATININE  --  2.71*  --    < > 5.12* 5.81* 6.82* 7.74* 8.75*  CALCIUM  --  7.1*  --    < > 8.6* 8.8* 8.8* 9.0 9.1  MG 2.2 2.4 2.2  --   --   --   --  2.8* 3.0*  PHOS 4.5 5.2* 4.5  --   --   --   --   --   --    < > = values in this interval not displayed.   GFR: Estimated Creatinine Clearance: 10.3 mL/min (A) (by C-G formula based on SCr of 8.75 mg/dL  (H)).  Liver Function Tests: Recent Labs  Lab 11/11/17 0344 11/12/17 0358 11/13/17 0352 11/13/17 0805 11/15/17 0336 11/16/17 0401  AST 81* 79* 103*  --  94* 95*  ALT 36 32 32  --  38 32  ALKPHOS 324* 262* 252*  --  207* 197*  BILITOT 12.7* 15.4* QUANTITY NOT SUFFICIENT, UNABLE TO PERFORM TEST 14.6* 13.1* 12.3*  PROT 5.8* 6.0* 5.7*  --  5.9* 6.1*  ALBUMIN 1.9* 2.2* 2.2*  --  2.5* 2.9*    Recent Labs  Lab 11/10/17 0458 11/14/17 0625 11/15/17 0336 11/16/17 0401  AMMONIA 136* 82* 122* 96*    Coagulation Profile: Recent Labs  Lab 11/14/17 0625 11/15/17 0336 11/16/17 0401  INR 1.35 1.45 1.48    HbA1C: Hgb A1c MFr Bld  Date/Time Value Ref Range Status  04/09/2013 10:40 PM 7.6 (H) <5.7 % Final    Comment:    (NOTE)                                                                       According to the ADA Clinical Practice Recommendations for 2011, when HbA1c is used as a screening test:  >=6.5%   Diagnostic of Diabetes Mellitus           (if abnormal result is confirmed) 5.7-6.4%   Increased risk of developing Diabetes Mellitus References:Diagnosis and Classification of Diabetes Mellitus,Diabetes Care,2011,34(Suppl 1):S62-S69 and Standards of Medical Care in         Diabetes - 2011,Diabetes Care,2011,34 (Suppl 1):S11-S61.  02/28/2011 09:42 AM 9.2 (H) 4.6 - 6.5 % Final    Comment:    Glycemic Control Guidelines for People with Diabetes:Non Diabetic:  <6%Goal of Therapy: <7%Additional Action Suggested:  >8%     CBG: Recent Labs  Lab 11/15/17 1617 11/15/17 2009 11/16/17 0007 11/16/17 0425 11/16/17 0749  GLUCAP 152* 119* 121* 140* 97    Recent Results (from the past 240 hour(s))  Culture, blood (routine x 2)     Status: None   Collection Time: 11/07/17  5:55 PM  Result Value Ref Range Status   Specimen Description BLOOD RIGHT HAND  Final   Special Requests   Final    BOTTLES DRAWN AEROBIC AND ANAEROBIC Blood Culture adequate volume   Culture  Final     NO GROWTH 5 DAYS Performed at [facility], [address] [place], [place] [address]    Report Status 11/12/2017 FINAL  Final  Culture, blood (routine x 2)     Status: None   Collection Time: 11/07/17  6:40 PM  Result Value Ref Range Status   Specimen Description BLOOD RIGHT HAND  Final   Special Requests IN PEDIATRIC BOTTLE Blood Culture adequate volume  Final   Culture   Final    NO GROWTH 5 DAYS Performed at [facility], [address] [place], [place] [address]    Report Status 11/12/2017 FINAL  Final  MRSA PCR Screening     Status: None   Collection Time: 11/07/17  8:29 PM  Result Value Ref Range Status   MRSA by PCR NEGATIVE NEGATIVE Final    Comment:        The GeneXpert MRSA Assay (FDA approved for NASAL specimens only), is one component of a comprehensive MRSA colonization surveillance program. It is not intended to diagnose MRSA infection nor to guide or monitor treatment for MRSA infections. Performed at [facility], [address] [place], [place] [address]   Urine Culture     Status: None   Collection Time: 11/08/17  2:09 AM  Result Value Ref Range Status   Specimen Description URINE, RANDOM  Final   Special Requests NONE  Final   Culture   Final    NO GROWTH Performed at [facility], [address] [place], [place] [address]    Report Status 11/09/2017 FINAL  Final  Culture, respiratory (NON-Expectorated)     Status: None   Collection Time: 11/08/17  5:05 AM  Result Value Ref Range Status   Specimen Description TRACHEAL ASPIRATE  Final   Special Requests NONE  Final   Gram Stain   Final    ABUNDANT WBC PRESENT,BOTH PMN AND MONONUCLEAR RARE SQUAMOUS EPITHELIAL CELLS PRESENT ABUNDANT GRAM POSITIVE COCCI IN PAIRS IN CLUSTERS ABUNDANT GRAM NEGATIVE COCCI IN PAIRS RARE GRAM POSITIVE RODS    Culture   Final    MODERATE Consistent with normal respiratory flora. Performed at [facility], [address]  [place], [place] [address]    Report Status 11/10/2017 FINAL  Final  Urine Culture     Status: Abnormal   Collection Time: 11/11/17  4:27 PM  Result Value Ref Range Status   Specimen Description URINE, CATHETERIZED  Final   Special Requests   Final    NONE Performed at [facility], [address] [place], [place] [address]    Culture MULTIPLE SPECIES PRESENT, SUGGEST RECOLLECTION (A)  Final   Report Status 11/12/2017 FINAL  Final     Scheduled Meds: . feeding supplement (ENSURE ENLIVE)  237 mL Oral BID BM  . folic acid  1 mg Oral Daily  . heparin injection (subcutaneous)  5,000 Units Subcutaneous Q8H  . insulin aspart  0-15 Units Subcutaneous Q4H  . ipratropium-albuterol  3 mL Nebulization TID  . lactulose  20 g Oral TID  . mouth rinse  15 mL Mouth Rinse BID  . midodrine  10 mg Oral TID WC  . octreotide  200 mcg Subcutaneous BID  . QUEtiapine  25 mg Oral QHS  . rifaximin  550 mg Oral BID  . thiamine  100 mg Oral Daily     LOS: 9 days   Lonia BloodJeffrey T. Greycen Felter, MD [facility] Office  [phone] Pager - Text Page per Loretha Stapler as per below:  On-Call/Text Page:      [email or web address]      password [email or web address]  If 7PM-7AM, please contact night-coverage [email or web address] Password [email or web address] 11/16/2017, 9:29 AM

## 2017-11-16 NOTE — Progress Notes (Signed)
This nurse has talked to Dr. Hyman HopesWebb and Dr. Sharon SellerMcClung about needing HD catheter for pt for CRRT. Awaiting line.

## 2017-11-16 NOTE — Progress Notes (Signed)
Physical Therapy Treatment Patient Details Name: [name] MRN: [id number] DOB: 10/03/1960 Today's Date: 11/16/2017    History of Present Illness Pt adm with abd swelling and lethargy and found to have acute hepatic encephalopathy. Pt intubated for airway protection  3/17-3/20. Pt also with acute kidney injury. PMH - DM, PTSD, compression fx's and pain/ debilitation, GERD, EtOH abuse with cirrhosis    PT Comments    Pt admitted with above diagnosis. Pt currently with functional limitations due to the deficits listed below (see PT Problem List). Pt was able to sit EOB with min to mod assist for 15 min and eat his pudding with help.  Could not comb hair or wash face.  Pt stood with min assist of 2 and [facility] a few steps [facility]  Needs continued PT.  Moans constantly.   Pt will benefit from skilled PT to increase their independence and safety with mobility to allow discharge to the venue listed below.     Follow Up Recommendations  SNF     Equipment Recommendations  None recommended by PT    Recommendations for Other Services       Precautions / Restrictions Precautions Precautions: Fall Restrictions Weight Bearing Restrictions: No    Mobility  Bed Mobility Overal bed mobility: Needs Assistance Bed Mobility: Supine to Sit;Sit to Supine     Supine to sit: +2 for physical assistance;Mod assist Sit to supine: +2 for physical assistance;Total assist   General bed mobility comments: Assist to bring legs off bed, elevate trunk into sitting, and bring hips to EOB. Assist for all aspects to return to supine.  Transfers Overall transfer level: Needs assistance Equipment used: 2 person hand held assist Transfers: Sit to/from Stand Sit to Stand: +2 physical assistance;Min assist         General transfer comment: assist to rise and for balance, took several steps along EOB and 2 steps forward and back toward bed  Ambulation/Gait                 Stairs             Wheelchair Mobility    Modified Rankin (Stroke Patients Only)       Balance Overall balance assessment: Needs assistance Sitting-balance support: No upper extremity supported;Feet supported Sitting balance-Leahy Scale: Poor   Postural control: Posterior lean Standing balance support: Bilateral upper extremity supported Standing balance-Leahy Scale: Poor Standing balance comment: B UE support and min assist for standing                             Cognition Arousal/Alertness: Awake/alert Behavior During Therapy: Flat affect Overall Cognitive Status: Impaired/Different from baseline Area of Impairment: Orientation;Attention;Memory;Following commands;Safety/judgement;Problem solving                 Orientation Level: Disoriented to;Situation;Time Current Attention Level: Sustained Memory: Decreased short-term memory;Decreased recall of precautions Following Commands: Follows one step commands with increased time Safety/Judgement: Decreased awareness of safety;Decreased awareness of deficits   Problem Solving: Slow processing;Requires verbal cues;Difficulty sequencing;Requires tactile cues        Exercises      General Comments General comments (skin integrity, edema, etc.): Pt moans constantly.      Pertinent Vitals/Pain Pain Assessment: Faces Faces Pain Scale: Hurts even more Pain Location: Lower Extremities when touched Pain Descriptors / Indicators: Moaning Pain Intervention(s): Limited activity within patient's tolerance;Monitored during session;Repositioned;Patient requesting pain meds-RN notified  VSS  Home  Living Family/patient expects to be discharged to:: Private residence Living Arrangements: Spouse/significant other Available Help at Discharge: Family Type of Home: House Home Access: Stairs to enter Entrance Stairs-Rails: Right Home Layout: One level Home Equipment: Environmental consultant - 2 wheels;Cane - single point;Wheelchair - manual       Prior Function Level of Independence: Needs assistance  Gait / Transfers Assistance Needed: Amb modified independent with cane ADL's / Homemaking Assistance Needed: [place] when wife is in the house for safety.  Does not drive.     PT Goals (current goals can now be found in the care plan section) Acute Rehab PT Goals Patient Stated Goal: not stated Progress towards PT goals: Progressing toward goals    Frequency    Min 2X/week      PT Plan Current plan remains appropriate;Frequency needs to be updated    Co-evaluation PT/OT/SLP Co-Evaluation/Treatment: Yes Reason for Co-Treatment: Complexity of the patient's impairments (multi-system involvement);Necessary to address cognition/behavior during functional activity;For patient/therapist safety PT goals addressed during session: Mobility/safety with mobility OT goals addressed during session: ADL's and self-care      AM-PAC PT "6 Clicks" Daily Activity  Outcome Measure  Difficulty turning over in bed (including adjusting bedclothes, sheets and blankets)?: Unable Difficulty moving from lying on back to sitting on the side of the bed? : Unable Difficulty sitting down on and standing up from a chair with arms (e.g., wheelchair, bedside commode, etc,.)?: Unable Help needed moving to and from a bed to chair (including a wheelchair)?: A Little Help needed walking in hospital room?: Total Help needed climbing 3-5 steps with a railing? : Total 6 Click Score: 8    End of Session Equipment Utilized During Treatment: Gait belt;Oxygen Activity Tolerance: Patient limited by fatigue Patient left: in bed;with call bell/phone within reach;with bed alarm set;with restraints reapplied Nurse Communication: Mobility status PT Visit Diagnosis: Unsteadiness on feet (R26.81);Other abnormalities of gait and mobility (R26.89);Muscle weakness (generalized) (M62.81)     Time: 1610-9604 PT Time Calculation (min) (ACUTE ONLY): 24 min  Charges:   $Therapeutic Activity: 8-22 mins                    G Codes:       Aidel Davisson,PT Acute Rehabilitation [phone] (pager)    Berline Lopes 11/16/2017, 4:19 PM

## 2017-11-16 NOTE — Progress Notes (Signed)
PULMONARY / CRITICAL CARE MEDICINE   Name: MACALLAN ORD MRN: [id number] DOB: 1960-08-26    ADMISSION DATE:  11/07/2017 CONSULTATION DATE:  11/07/2017  REFERRING MD:  EDP  CHIEF COMPLAINT:  Abdominal swelling, encephalopath  HISTORY OF PRESENT ILLNESS:   57 yo male with a history of cirrhosis and etoh use, cad, and DM2 was admitted with decompensated hepatic encephalopathy.  After admission he developed worsening renal failure.  PCCM called back to see him 3/25 in the setting of worsening encephalopathy and AKI.    PAST MEDICAL HISTORY :  He  has a past medical history of Chronic lower back pain, Compression fracture, Diabetes mellitus, Fracture acetabulum-closed ([place]) (04/09/2013), GERD (gastroesophageal reflux disease), H/O Legionnaire's disease, Hepatomegaly, Migraine, Osteoporosis, Pancreatitis, PTSD (post-traumatic stress disorder), and Vitamin D deficiency.  PAST SURGICAL HISTORY: He  has a past surgical history that includes Cholecystectomy; Hernia repair; Laminectomy; Back surgery; Sternum fracture surgery; Kyphoplasty (N/A, 07/16/2015); and Mass excision (Left, 06/12/2017).  Allergies  Allergen Reactions  . Ketorolac Tromethamine Hives  . Temsirolimus Other (See Comments)    Unknown - Pt does not recognize this medication [facility]    No current facility-administered medications on file prior to encounter.    Current Outpatient Medications on File Prior to Encounter  Medication Sig  . albuterol (PROVENTIL HFA;VENTOLIN HFA) 108 (90 Base) MCG/ACT inhaler Inhale 2 puffs into the lungs every 6 (six) hours as needed for wheezing or shortness of breath.  . carboxymethylcellulose (REFRESH PLUS) 0.5 % SOLN Place 1 drop into both eyes 3 (three) times daily as needed (dry eyes).  . gabapentin (NEURONTIN) 400 MG capsule Take 3 capsules (1,200 mg total) by mouth 3 (three) times daily.  Marland Kitchen guaiFENesin (MUCINEX) 600 MG 12 hr tablet Take 2 tablets (1,200 mg total) by mouth 2  (two) times daily.  . hydrochlorothiazide (HYDRODIURIL) 25 MG tablet Take 0.5 tablets (12.5 mg total) by mouth 2 (two) times daily.  Marland Kitchen ibuprofen (ADVIL,MOTRIN) 200 MG tablet Take 600 mg by mouth every 6 (six) hours as needed (back pain).  . insulin aspart (NOVOLOG FLEXPEN) 100 UNIT/ML FlexPen INJECT 7 TO 15 UNITS subcu up to 4 times daily (Patient taking differently: INJECT 9 - 17 UNITS SQ UP TO FOUR TIMES A DAY)  . Insulin Glargine (LANTUS SOLOSTAR) 100 UNIT/ML Solostar Pen Inject 37 Units into the skin daily. (Patient taking differently: Inject 33 Units into the skin daily. )  . Magnesium 500 MG TABS Take 500 mg by mouth daily.  . methocarbamol (ROBAXIN) 500 MG tablet Take 500 mg by mouth 2 (two) times daily as needed for muscle spasms.  Marland Kitchen morphine (MSIR) 15 MG tablet Take 15 mg by mouth 3 (three) times daily.   . pantoprazole (PROTONIX) 40 MG tablet Take 1 tablet (40 mg total) by mouth daily.  . polyethylene glycol (MIRALAX / GLYCOLAX) packet Take 17 g by mouth 2 (two) times daily.  Marland Kitchen POTASSIUM PO Take 1 tablet by mouth at bedtime as needed (leg cramps).  . Blood Glucose Monitoring Suppl (ACCU-CHEK AVIVA PLUS) w/Device KIT 1 each by Does not apply route daily.  . Glucose Blood (BLOOD GLUCOSE TEST STRIPS) STRP 1 strip by In Vitro route 4 (four) times daily.    FAMILY HISTORY:  His indicated that his mother is alive. He indicated that his father is alive. He indicated that his sister is alive. He indicated that his brother is alive. He indicated that the status of his unknown relative is unknown.   SOCIAL HISTORY:  He  reports that he quit smoking about 10 years ago. His smoking use included cigarettes. He has a 13.00 pack-year smoking history. He has never used smokeless tobacco. He reports that he drinks about 3.6 oz of alcohol per week. He reports that he does not use drugs.  REVIEW OF SYSTEMS:   Cannot obtain  SUBJECTIVE:  Not protecting airway  VITAL SIGNS: BP 116/79   Pulse 86    Temp (!) 97 F (36.1 C) (Axillary)   Resp (!) 22   Ht _0  (1.727 m)   Wt 192 lb 14.4 oz (87.5 kg)   SpO2 93%   BMI 29.33 kg/m   HEMODYNAMICS:    VENTILATOR SETTINGS: FiO2 (%):  [97 %] 97 %  INTAKE / OUTPUT: I/O last 3 completed shifts: In: 317 [P.O.:30; IV Piggyback:287] Out: 35 [Urine:35]  PHYSICAL EXAMINATION:  General:  Increased work of breathing HENT: NCAT OP clear PULM: CTA B, normal effort CV: RRR, no mgr GI: BS+, soft, nontender MSK: normal bulk and tone Neuro: no response to external stimuli   LABS:  BMET Recent Labs  Lab 11/14/17 0625 11/15/17 0336 11/16/17 0401  NA 136 139 138  K 4.3 4.1 4.7  CL 99* 105 103  CO2 20* 21* 18*  BUN 41* 50* 53*  CREATININE 6.82* 7.74* 8.75*  GLUCOSE 121* 109* 144*    Electrolytes Recent Labs  Lab 11/09/17 1739 11/10/17 0458 11/10/17 1745  11/14/17 0625 11/15/17 0336 11/16/17 0401  CALCIUM  --  7.1*  --    < > 8.8* 9.0 9.1  MG 2.2 2.4 2.2  --   --  2.8* 3.0*  PHOS 4.5 5.2* 4.5  --   --   --   --    < > = values in this interval not displayed.    CBC Recent Labs  Lab 11/14/17 0625 11/15/17 0336 11/16/17 0401  WBC 20.0* 17.9* 20.2*  HGB 10.2* 9.3* 9.4*  HCT 32.0* 29.1* 30.5*  PLT 110* 101* 60*    Coag's Recent Labs  Lab 11/14/17 0625 11/15/17 0336 11/16/17 0401  INR 1.35 1.45 1.48    Sepsis Markers Recent Labs  Lab 11/10/17 0458  PROCALCITON 2.96    ABG Recent Labs  Lab 11/14/17 0917  PHART 7.351  PCO2ART 35.3  PO2ART 46.0*    Liver Enzymes Recent Labs  Lab 11/13/17 0352 11/13/17 0805 11/15/17 0336 11/16/17 0401  AST 103*  --  94* 95*  ALT 32  --  38 32  ALKPHOS 252*  --  207* 197*  BILITOT QUANTITY NOT SUFFICIENT, UNABLE TO PERFORM TEST 14.6* 13.1* 12.3*  ALBUMIN 2.2*  --  2.5* 2.9*    Cardiac Enzymes No results for input(s): TROPONINI, PROBNP in the last 168 hours.  Glucose Recent Labs  Lab 11/15/17 2009 11/16/17 0007 11/16/17 0425 11/16/17 0749  11/16/17 1113 11/16/17 1533  GLUCAP 119* 121* 140* 97 118* 239*    Imaging Dg Chest Port 1 View  Result Date: 11/16/2017 CLINICAL DATA:  Central line placement EXAM: PORTABLE CHEST 1 VIEW COMPARISON:  Portable exam 1630 hours compared to 11/14/2017 FINDINGS: RIGHT jugular central venous catheter with tip projecting over SVC. Enlargement of cardiac silhouette with pulmonary vascular congestion. Mild chronic interstitial infiltrates likely pulmonary edema, see question slightly increased in LEFT upper lobe. No pleural effusion or pneumothorax. Bones demineralized with malleable plates again identified at the sternum. Old LEFT rib fractures. IMPRESSION: No pneumothorax following RIGHT jugular line placement. Probable mild pulmonary edema, question minimally  increased Electronically Signed   By: Lavonia Dana M.D.   On: 11/16/2017 17:09   Dg Abd Portable 1v  Result Date: 11/16/2017 CLINICAL DATA:  Abdominal distension EXAM: PORTABLE ABDOMEN - 1 VIEW COMPARISON:  Portable exam 1628 hours compared to CT abdomen and pelvis 10/16/2017 FINDINGS: Gaseous distention of stomach. Air-filled normal sized small bowel loops in mid abdomen. Paucity of colonic gas. No definite bowel wall thickening or obstruction. Bones demineralized with old appearing compression deformities at T12 and L1 and prior spinal augmentation procedure at L2. IMPRESSION: Gaseous distention of stomach. Electronically Signed   By: Lavonia Dana M.D.   On: 11/16/2017 17:08    STUDIES:    CULTURES: 11/07/2017 blood cultures x2>> 11/07/2017 urine culture>> 11/07/2017 paracentesis >> not done, no significant fluid  ANTIBIOTICS: Ceftriaxone 3/16 >>  vancomycin 3/17 >>   SIGNIFICANT EVENTS:   LINES/TUBES: ETT 3/17 >>  3/25 >   DISCUSSION: 57 y/o male with worsening AKI and encephalopathy in the setting of baseline cirrhosis.  Likely aspirated based on exam 3/25.  ASSESSMENT / PLAN:  PULMONARY A: Aspiration Acute respiratory  failure due to inability to protect airway P:   Full mechanical vent support VAP prevention Daily WUA/SBT Intubate now  CARDIOVASCULAR A:  CAD P:  Tele   RENAL A:   Worsening AKI, ATN vs hepatorenal P:   After much discussion we will start short term CVVHD  GASTROINTESTINAL A:   Alcoholic Cirrhosis P:   Minimize sedating medications  HEMATOLOGIC A:   Anemia no bleeding Coagulopathy of cirrhosis P:  montior for bleeding  INFECTIOUS A:   aspiration P:   Send resp culture Monitor for fever  ENDOCRINE A:   No acute issues  P:   Monitor glucose  NEUROLOGIC A:   Hepatic encephalopathy vs encephalopathy from renal diseae P:   RASS goal: 0 Intermittent fentanyl per PAD protocol   FAMILY  - Updates: wife updated bedside.  Informed of poor prognosis.  Wants to try short term aggressive measures  - Inter-disciplinary family meet or Palliative Care meeting due by:  day 7  My cc time 35 minutes  Roselie Awkward, MD [facility] Pager: [phone] Cell: [phone] After 3pm or if no response, call [phone]     11/16/2017, 5:17 PM

## 2017-11-16 NOTE — Evaluation (Signed)
Occupational Therapy Evaluation Patient Details Name: LEMARCUS [name] MRN: [id number] DOB: 05/27/1961 Today's Date: 11/16/2017    History of Present Illness Pt adm with abd swelling and lethargy and found to have acute hepatic encephalopathy. Pt intubated for airway protection  3/17-3/20. Pt also with acute kidney injury. PMH - DM, PTSD, compression fx's and pain/ debilitation, GERD, EtOH abuse with cirrhosis   Clinical Impression   Pt was modified independent in ambulation and supervised for shower prior to admission. Pt with constant moaning and yelling, "Ow" during session. Appeared to have pain in LEs with touch, but at times seeming to be perseverative. Pt requires 2 person assist for all mobility. He self fed with min assist at EOB with increased spillage and assist to prevent posterior LOB. Pt will need SNF upon discharge. Will follow acutely.   Follow Up Recommendations  SNF;Supervision/Assistance - 24 hour    Equipment Recommendations       Recommendations for Other Services       Precautions / Restrictions Precautions Precautions: Fall      Mobility Bed Mobility Overal bed mobility: Needs Assistance Bed Mobility: Supine to Sit;Sit to Supine     Supine to sit: +2 for physical assistance;Mod assist Sit to supine: +2 for physical assistance;Total assist   General bed mobility comments: Assist to bring legs off bed, elevate trunk into sitting, and bring hips to EOB. Assist for all aspects to return to supine.  Transfers Overall transfer level: Needs assistance Equipment used: 2 person hand held assist Transfers: Sit to/from Stand Sit to Stand: +2 physical assistance;Min assist         General transfer comment: assist to rise and for balance, took several steps along EOB and 2 steps forward and back toward bed    Balance Overall balance assessment: Needs assistance Sitting-balance support: No upper extremity supported;Feet supported Sitting balance-Leahy Scale:  Poor   Postural control: Posterior lean Standing balance support: Bilateral upper extremity supported Standing balance-Leahy Scale: Poor Standing balance comment: B UE support and min assist for standing                            ADL either performed or assessed with clinical judgement   ADL Overall ADL's : Needs assistance/impaired Eating/Feeding: Minimal assistance;Sitting Eating/Feeding Details (indicate cue type and reason): ate pudding with spoon Grooming: Wash/dry hands;Wash/dry face;Total assistance;Sitting   Upper Body Bathing: Total assistance;Sitting   Lower Body Bathing: Total assistance;+2 for physical assistance;Sit to/from stand   Upper Body Dressing : Maximal assistance;Bed level   Lower Body Dressing: Total assistance;+2 for physical assistance;Sit to/from stand   Toilet Transfer: Minimal assistance;+2 for physical assistance   Toileting- Clothing Manipulation and Hygiene: Total assistance;+2 for physical assistance;Sit to/from stand       Functional mobility during ADLs: Minimal assistance;+2 for physical assistance       Vision         Perception     Praxis      Pertinent Vitals/Pain Pain Assessment: Faces Faces Pain Scale: Hurts even more Pain Location: Lower Extremities when touched Pain Descriptors / Indicators: Moaning Pain Intervention(s): Monitored during session;Repositioned     Hand Dominance Right   Extremity/Trunk Assessment Upper Extremity Assessment Upper Extremity Assessment: Generalized weakness(tremor)   Lower Extremity Assessment Lower Extremity Assessment: Defer to PT evaluation       Communication Communication Communication: Expressive difficulties(difficult to understand at times)   Cognition Arousal/Alertness: Awake/alert Behavior During Therapy: Flat affect  Overall Cognitive Status: Impaired/Different from baseline Area of Impairment: Orientation;Attention;Memory;Following  commands;Safety/judgement;Problem solving                 Orientation Level: Disoriented to;Situation;Time Current Attention Level: Sustained Memory: Decreased short-term memory;Decreased recall of precautions Following Commands: Follows one step commands with increased time Safety/Judgement: Decreased awareness of safety;Decreased awareness of deficits   Problem Solving: Slow processing;Requires verbal cues;Difficulty sequencing;Requires tactile cues     General Comments       Exercises     Shoulder Instructions      Home Living Family/patient expects to be discharged to:: Private residence Living Arrangements: Spouse/significant other Available Help at Discharge: Family Type of Home: House Home Access: Stairs to enter Secretary/administratorntrance Stairs-Number of Steps: 2 Entrance Stairs-Rails: Right Home Layout: One level     Bathroom Shower/Tub: Walk-in shower;Door   Bathroom Toilet: Handicapped height     Home Equipment: Environmental consultantWalker - 2 wheels;Cane - single point;Wheelchair - manual          Prior Functioning/Environment Level of Independence: Needs assistance  Gait / Transfers Assistance Needed: Amb modified independent with cane ADL's / Homemaking Assistance Needed: [place] when wife is in the house for safety.  Does not drive.            OT Problem List: Decreased strength;Decreased activity tolerance;Impaired balance (sitting and/or standing);Decreased cognition;Decreased coordination;Decreased safety awareness;Decreased knowledge of use of DME or AE;Cardiopulmonary status limiting activity;Obesity;Pain;Impaired UE functional use;Impaired tone      OT Treatment/Interventions: Self-care/ADL training;DME and/or AE instruction;Patient/family education;Balance training;Therapeutic activities    OT Goals(Current goals can be found in the care plan section) Acute Rehab OT Goals Patient Stated Goal: not stated OT Goal Formulation: Patient unable to participate in goal  setting Time For Goal Achievement: 11/30/17 Potential to Achieve Goals: Fair ADL Goals Pt Will Perform Eating: with supervision;with set-up;sitting Pt Will Perform Grooming: with mod assist;sitting Pt Will Perform Upper Body Dressing: with mod assist;sitting Pt Will Transfer to Toilet: ambulating;with min assist;bedside commode Additional ADL Goal #1: Pt will sit EOB x 10 minutes with supervision while engaged in ADL.  OT Frequency: Min 2X/week   Barriers to D/C:            Co-evaluation PT/OT/SLP Co-Evaluation/Treatment: Yes Reason for Co-Treatment: Necessary to address cognition/behavior during functional activity;For patient/therapist safety   OT goals addressed during session: ADL's and self-care      AM-PAC PT "6 Clicks" Daily Activity     Outcome Measure Help from another person eating meals?: A Little Help from another person taking care of personal grooming?: Total Help from another person toileting, which includes using toliet, bedpan, or urinal?: Total Help from another person bathing (including washing, rinsing, drying)?: Total Help from another person to put on and taking off regular upper body clothing?: A Lot Help from another person to put on and taking off regular lower body clothing?: Total 6 Click Score: 9   End of Session Equipment Utilized During Treatment: Oxygen Nurse Communication: Mobility status  Activity Tolerance: Patient tolerated treatment well Patient left: in bed;with call bell/phone within reach;with bed alarm set  OT Visit Diagnosis: Unsteadiness on feet (R26.81);Other abnormalities of gait and mobility (R26.89);Muscle weakness (generalized) (M62.81);Pain;Cognitive communication deficit (R41.841);Other symptoms and signs involving cognitive function                Time: 1425-1451 OT Time Calculation (min): 26 min Charges:  OT General Charges $OT Visit: 1 Visit OT Evaluation $OT Eval Moderate Complexity: 1 Mod G-Codes:  Evern Bio 11/16/2017, 3:12 PM  11/16/2017 Martie Round, OTR/L Pager: [phone]

## 2017-11-16 NOTE — Progress Notes (Signed)
Subjective: Awake but not voicing complaints.  Objective: Vital signs in last 24 hours: Temp:  [96.3 F (35.7 C)-97.4 F (36.3 C)] 96.4 F (35.8 C) (03/25 1115) Pulse Rate:  [63-75] 73 (03/25 0700) Resp:  [13-29] 22 (03/25 0700) BP: (91-121)/(58-88) 92/68 (03/25 0700) SpO2:  [91 %-96 %] 94 % (03/25 0700) FiO2 (%):  [97 %] 97 % (03/25 0822) Weight:  [192 lb 14.4 oz (87.5 kg)] 192 lb 14.4 oz (87.5 kg) (03/25 0500) Weight change: -1 lb 8.7 oz (-0.7 kg) Last BM Date: 11/16/17  PE: GEN:  Awake but not answering questions. HEENT:  Jaundiced ABD:  Distended, tympanic EXT:  Erythema and bilateral lower extremity swelling  Lab Results: CBC    Component Value Date/Time   WBC 20.2 (H) 11/16/2017 0401   RBC 2.60 (L) 11/16/2017 0401   HGB 9.4 (L) 11/16/2017 0401   HGB 13.1 10/22/2017 1542   HCT 30.5 (L) 11/16/2017 0401   HCT 40.3 10/22/2017 1542   PLT 60 (L) 11/16/2017 0401   PLT 107 (L) 10/22/2017 1542   MCV 117.3 (H) 11/16/2017 0401   MCV 110 (H) 10/22/2017 1542   MCH 36.2 (H) 11/16/2017 0401   MCHC 30.8 11/16/2017 0401   RDW 18.8 (H) 11/16/2017 0401   RDW 15.1 10/22/2017 1542   LYMPHSABS 1.6 11/16/2017 0401   LYMPHSABS 1.4 10/22/2017 1542   MONOABS 1.6 (H) 11/16/2017 0401   EOSABS 0.4 11/16/2017 0401   EOSABS 0.0 10/22/2017 1542   BASOSABS 0.2 (H) 11/16/2017 0401   BASOSABS 0.1 10/22/2017 1542   CMP     Component Value Date/Time   NA 138 11/16/2017 0401   NA 137 10/22/2017 1542   K 4.7 11/16/2017 0401   CL 103 11/16/2017 0401   CO2 18 (L) 11/16/2017 0401   GLUCOSE 144 (H) 11/16/2017 0401   BUN 53 (H) 11/16/2017 0401   BUN 5 (L) 10/22/2017 1542   CREATININE 8.75 (H) 11/16/2017 0401   CALCIUM 9.1 11/16/2017 0401   CALCIUM (LL) 04/07/2007 1517    5.5 Result repeated and verified. CRITICAL RESULT CALLED TO, READ BACK BY AND VERIFIED WITH: HUDGSON,M. RN 04/07/07 1031 WAYK CORRECTED ON 08/15 AT 0024: PREVIOUSLY REPORTED AS 5.5 Result repeated and verified.   PROT 6.1  (L) 11/16/2017 0401   PROT 6.0 10/22/2017 1542   ALBUMIN 2.9 (L) 11/16/2017 0401   ALBUMIN 2.6 (L) 10/22/2017 1542   AST 95 (H) 11/16/2017 0401   ALT 32 11/16/2017 0401   ALKPHOS 197 (H) 11/16/2017 0401   BILITOT 12.3 (H) 11/16/2017 0401   BILITOT 4.4 (H) 10/22/2017 1542   GFRNONAA 6 (L) 11/16/2017 0401   GFRAA 7 (L) 11/16/2017 0401   Assessment:  1. Decompensated alcoholic cirrhosis. 2.  Hepatic encephalopathy. 3.  Abdominal distention, exam more consistent with ileus and not ascites. 4.  Leukocytosis. 5.  Acute renal failure.  ATN versus HRS.  Nephrology following.  Plan:  1.  Empiric antibiotics. 2.  Xifaxan and lactulose. 3.  Octreotide, midodrine for possible HRS. 4.  Xray to check abdominal distention. 5.  Difficult situation.  Prognosis guarded. 6.  [facility] will follow.   Freddy JakschOUTLAW,Rien Marland M 11/16/2017, 12:04 PM   Cell [phone] If no answer or after 5 PM call [phone]

## 2017-11-16 NOTE — Progress Notes (Signed)
[facility] ROUNDING NOTE   Subjective:    57 year old man with a history of diabetes Parkinson compression fractures and alcohol abuse.  He continues to drink.  He presented to the ED with 2 weeks of increasing edema abdominal swelling and lethargy.  Hypoglycemic due to ongoing insulin use. Intubated for airway protection.  Hypotension.  Treated empirically for presumed SBP although quantity of ascites was not sufficient to tap.  Started on lactulose  Creatinine continues to rise to 8.75   today ( 7.7 3/23)   Pretty much oliguric   This is a difficult situation and not sure that dialysis would be indicated  The initial finding would suggest ATN   We have placed on midodrine and octreotide and albumin   Objective:  Vital signs in last 24 hours:  Temp:  [96.3 F (35.7 C)-97.4 F (36.3 C)] 96.4 F (35.8 C) (03/25 1115) Pulse Rate:  [63-75] 73 (03/25 0700) Resp:  [13-29] 22 (03/25 0700) BP: (91-121)/(58-88) 92/68 (03/25 0700) SpO2:  [91 %-96 %] 94 % (03/25 0700) FiO2 (%):  [97 %] 97 % (03/25 0822) Weight:  [192 lb 14.4 oz (87.5 kg)] 192 lb 14.4 oz (87.5 kg) (03/25 0500)  Weight change: -1 lb 8.7 oz (-0.7 kg) Filed Weights   11/14/17 0500 11/15/17 0500 11/16/17 0500  Weight: 206 lb 2.1 oz (93.5 kg) 194 lb 7.1 oz (88.2 kg) 192 lb 14.4 oz (87.5 kg)    Intake/Output: I/O last 3 completed shifts: In: 317 [P.O.:30; IV Piggyback:287] Out: 35 [Urine:35]   Intake/Output this shift:  Total I/O In: 93.5 [IV Piggyback:93.5] Out: 15 [Urine:15]  CVS- RRR  Icteric  RS- CTA ABD- BS present soft non-distended EXT- 1 +  edema   Basic Metabolic Panel: Recent Labs  Lab 11/09/17 1739 11/10/17 0458 11/10/17 1745  11/12/17 0358 11/13/17 0352 11/14/17 0625 11/15/17 0336 11/16/17 0401  NA  --  133*  --    < > 139 136 136 139 138  K  --  3.8  --    < > 3.4* 4.3 4.3 4.1 4.7  CL  --  99*  --    < > 104 103 99* 105 103  CO2  --  22  --    < > 21* 18* 20* 21* 18*  GLUCOSE   --  136*  --    < > 90 116* 121* 109* 144*  BUN  --  15  --    < > 32* 41* 41* 50* 53*  CREATININE  --  2.71*  --    < > 5.12* 5.81* 6.82* 7.74* 8.75*  CALCIUM  --  7.1*  --    < > 8.6* 8.8* 8.8* 9.0 9.1  MG 2.2 2.4 2.2  --   --   --   --  2.8* 3.0*  PHOS 4.5 5.2* 4.5  --   --   --   --   --   --    < > = values in this interval not displayed.    Liver Function Tests: Recent Labs  Lab 11/11/17 0344 11/12/17 0358 11/13/17 0352 11/13/17 0805 11/15/17 0336 11/16/17 0401  AST 81* 79* 103*  --  94* 95*  ALT 36 32 32  --  38 32  ALKPHOS 324* 262* 252*  --  207* 197*  BILITOT 12.7* 15.4* QUANTITY NOT SUFFICIENT, UNABLE TO PERFORM TEST 14.6* 13.1* 12.3*  PROT 5.8* 6.0* 5.7*  --  5.9* 6.1*  ALBUMIN 1.9* 2.2* 2.2*  --  2.5* 2.9*   No results for input(s): LIPASE, AMYLASE in the last 168 hours. Recent Labs  Lab 11/14/17 0625 11/15/17 0336 11/16/17 0401  AMMONIA 82* 122* 96*    CBC: Recent Labs  Lab 11/11/17 0344 11/12/17 0358 11/13/17 0352 11/14/17 0625 11/15/17 0336 11/16/17 0401  WBC 15.2* 18.0* 20.0* 20.0* 17.9* 20.2*  NEUTROABS 11.8* 13.6*  --  15.2* 13.9* 16.4*  HGB 10.3* 10.2* 10.4* 10.2* 9.3* 9.4*  HCT 32.8* 31.9* 31.7* 32.0* 29.1* 30.5*  MCV 114.3* 113.1* 112.4* 114.3* 117.3* 117.3*  PLT 92* 85* 88* 110* 101* 60*    Cardiac Enzymes: No results for input(s): CKTOTAL, CKMB, CKMBINDEX, TROPONINI in the last 168 hours.  BNP: Invalid input(s): POCBNP  CBG: Recent Labs  Lab 11/15/17 2009 11/16/17 0007 11/16/17 0425 11/16/17 0749 11/16/17 1113  GLUCAP 119* 121* 140* 97 118*    Microbiology: Results for orders placed or performed during the hospital encounter of 11/07/17  Culture, blood (routine x 2)     Status: None   Collection Time: 11/07/17  5:55 PM  Result Value Ref Range Status   Specimen Description BLOOD RIGHT HAND  Final   Special Requests   Final    BOTTLES DRAWN AEROBIC AND ANAEROBIC Blood Culture adequate volume   Culture   Final    NO  GROWTH 5 DAYS Performed at [facility], [address] [place], [place] [address]    Report Status 11/12/2017 FINAL  Final  Culture, blood (routine x 2)     Status: None   Collection Time: 11/07/17  6:40 PM  Result Value Ref Range Status   Specimen Description BLOOD RIGHT HAND  Final   Special Requests IN PEDIATRIC BOTTLE Blood Culture adequate volume  Final   Culture   Final    NO GROWTH 5 DAYS Performed at [facility], [address] [place], [place] [address]    Report Status 11/12/2017 FINAL  Final  MRSA PCR Screening     Status: None   Collection Time: 11/07/17  8:29 PM  Result Value Ref Range Status   MRSA by PCR NEGATIVE NEGATIVE Final    Comment:        The GeneXpert MRSA Assay (FDA approved for NASAL specimens only), is one component of a comprehensive MRSA colonization surveillance program. It is not intended to diagnose MRSA infection nor to guide or monitor treatment for MRSA infections. Performed at [facility], [address] [place], [place] [address]   Urine Culture     Status: None   Collection Time: 11/08/17  2:09 AM  Result Value Ref Range Status   Specimen Description URINE, RANDOM  Final   Special Requests NONE  Final   Culture   Final    NO GROWTH Performed at [facility], [address] [place], [place] [address]    Report Status 11/09/2017 FINAL  Final  Culture, respiratory (NON-Expectorated)     Status: None   Collection Time: 11/08/17  5:05 AM  Result Value Ref Range Status   Specimen Description TRACHEAL ASPIRATE  Final   Special Requests NONE  Final   Gram Stain   Final    ABUNDANT WBC PRESENT,BOTH PMN AND MONONUCLEAR RARE SQUAMOUS EPITHELIAL CELLS PRESENT ABUNDANT GRAM POSITIVE COCCI IN PAIRS IN CLUSTERS ABUNDANT GRAM NEGATIVE COCCI IN PAIRS RARE GRAM POSITIVE RODS    Culture   Final    MODERATE Consistent with normal respiratory flora. Performed at [facility],  [address] [place],  [place] [address]    Report Status 11/10/2017 FINAL  Final  Urine Culture     Status: Abnormal   Collection Time: 11/11/17  4:27 PM  Result Value Ref Range Status   Specimen Description URINE, CATHETERIZED  Final   Special Requests   Final    NONE Performed at [facility], [address], [place], [place] [address]    Culture MULTIPLE SPECIES PRESENT, SUGGEST RECOLLECTION (A)  Final   Report Status 11/12/2017 FINAL  Final    Coagulation Studies: Recent Labs    11/14/17 0625 11/15/17 0336 11/16/17 0401  LABPROT 16.6* 17.5* 17.8*  INR 1.35 1.45 1.48    Urinalysis: No results for input(s): COLORURINE, LABSPEC, PHURINE, GLUCOSEU, HGBUR, BILIRUBINUR, KETONESUR, PROTEINUR, UROBILINOGEN, NITRITE, LEUKOCYTESUR in the last 72 hours.  Invalid input(s): APPERANCEUR    Imaging: No results found.   Medications:   . sodium chloride Stopped (11/10/17 0600)  . albumin human    . piperacillin-tazobactam (ZOSYN)  IV 3.375 g (11/16/17 1124)  . dialysis replacement fluid (prismasate)    . dialysis replacement fluid (prismasate)    . dialysate (PRISMASATE)    . sodium chloride     . feeding supplement (ENSURE ENLIVE)  237 mL Oral BID BM  . folic acid  1 mg Oral Daily  . heparin injection (subcutaneous)  5,000 Units Subcutaneous Q8H  . insulin aspart  0-15 Units Subcutaneous Q4H  . ipratropium-albuterol  3 mL Nebulization TID  . lactulose  20 g Oral TID  . mouth rinse  15 mL Mouth Rinse BID  . midodrine  10 mg Oral TID WC  . octreotide  200 mcg Subcutaneous BID  . QUEtiapine  25 mg Oral QHS  . rifaximin  550 mg Oral BID  . thiamine  100 mg Oral Daily   sodium chloride, albuterol, heparin, HYDROmorphone, sodium chloride  Assessment/ Plan:   Acute kidney injury  Thought to be ATN although certainly would fit with hepatorenal syndrome - we are trying midodrine octreotide and albumin  He has acute alcoholic cirrhosis  No indication for dialysis at this point  Will continue to  follow  Anemia   --  Hb 9.3  Follow with critical care   Check iron stores  Cirrhosis  -   INR 1.45   Lactulose     Encephalopathy   Waxes and wanes   Very challenging situation Discussed with father and outlined poor prognosis  With anuric renal failure  It does not appear that dialysis would be indicated at some point  I think a short term trial of dialysis maybe indicated  Due to his liver disease it may be helpful to try CRRT   Let us try CRRT today    LOS: 9 Xerxes Agrusa W @TODAY @11 :53 AM

## 2017-11-16 NOTE — Progress Notes (Signed)
Paged Dr. Sharon SellerMcClung regarding pt needing HD catheter placement.

## 2017-11-17 ENCOUNTER — Inpatient Hospital Stay (HOSPITAL_COMMUNITY): Payer: Medicare HMO

## 2017-11-17 LAB — GLUCOSE, CAPILLARY
GLUCOSE-CAPILLARY: 136 mg/dL — AB (ref 65–99)
GLUCOSE-CAPILLARY: 168 mg/dL — AB (ref 65–99)
GLUCOSE-CAPILLARY: 159 mg/dL — AB (ref 65–99)
Glucose-Capillary: 102 mg/dL — ABNORMAL HIGH (ref 65–99)
Glucose-Capillary: 108 mg/dL — ABNORMAL HIGH (ref 65–99)
Glucose-Capillary: 192 mg/dL — ABNORMAL HIGH (ref 65–99)

## 2017-11-17 LAB — RENAL FUNCTION PANEL
ANION GAP: 12 (ref 5–15)
Albumin: 3.4 g/dL — ABNORMAL LOW (ref 3.5–5.0)
Albumin: 3.2 g/dL — ABNORMAL LOW (ref 3.5–5.0)
Anion gap: 13 (ref 5–15)
BUN: 24 mg/dL — ABNORMAL HIGH (ref 6–20)
BUN: 21 mg/dL — AB (ref 6–20)
CALCIUM: 8.6 mg/dL — AB (ref 8.9–10.3)
CHLORIDE: 102 mmol/L (ref 101–111)
CO2: 24 mmol/L (ref 22–32)
CO2: 23 mmol/L (ref 22–32)
CREATININE: 3.75 mg/dL — AB (ref 0.61–1.24)
Calcium: 8.5 mg/dL — ABNORMAL LOW (ref 8.9–10.3)
Chloride: 105 mmol/L (ref 101–111)
Creatinine, Ser: 4.69 mg/dL — ABNORMAL HIGH (ref 0.61–1.24)
GFR calc Af Amer: 15 mL/min — ABNORMAL LOW (ref >60)
GFR calc Af Amer: 19 mL/min — ABNORMAL LOW (ref >60)
GFR calc non Af Amer: 13 mL/min — ABNORMAL LOW (ref >60)
GFR, EST NON AFRICAN AMERICAN: 17 mL/min — AB (ref >60)
Glucose, Bld: 138 mg/dL — ABNORMAL HIGH (ref 65–99)
Glucose, Bld: 171 mg/dL — ABNORMAL HIGH (ref 65–99)
POTASSIUM: 4.1 mmol/L (ref 3.5–5.1)
Phosphorus: 3.3 mg/dL (ref 2.5–4.6)
Phosphorus: 2.5 mg/dL (ref 2.5–4.6)
Potassium: 3.9 mmol/L (ref 3.5–5.1)
SODIUM: 141 mmol/L (ref 135–145)
Sodium: 138 mmol/L (ref 135–145)

## 2017-11-17 LAB — MAGNESIUM
Magnesium: 2.8 mg/dL — ABNORMAL HIGH (ref 1.7–2.4)
Magnesium: 2.6 mg/dL — ABNORMAL HIGH (ref 1.7–2.4)
Magnesium: 2.6 mg/dL — ABNORMAL HIGH (ref 1.7–2.4)

## 2017-11-17 LAB — CBC
HEMATOCRIT: 30.5 % — AB (ref 39.0–52.0)
HEMOGLOBIN: 9.6 g/dL — AB (ref 13.0–17.0)
MCH: 37.2 pg — ABNORMAL HIGH (ref 26.0–34.0)
MCHC: 31.5 g/dL (ref 30.0–36.0)
MCV: 118.2 fL — ABNORMAL HIGH (ref 78.0–100.0)
Platelets: 16 10*3/uL — CL (ref 150–400)
RBC: 2.58 MIL/uL — ABNORMAL LOW (ref 4.22–5.81)
RDW: 19.1 % — AB (ref 11.5–15.5)
WBC: 18.7 10*3/uL — AB (ref 4.0–10.5)

## 2017-11-17 LAB — COMPREHENSIVE METABOLIC PANEL
ALBUMIN: 3.4 g/dL — AB (ref 3.5–5.0)
ALK PHOS: 192 U/L — AB (ref 38–126)
ALT: 40 U/L (ref 17–63)
AST: 91 U/L — AB (ref 15–41)
Anion gap: 14 (ref 5–15)
BILIRUBIN TOTAL: 12.1 mg/dL — AB (ref 0.3–1.2)
BUN: 36 mg/dL — ABNORMAL HIGH (ref 6–20)
CO2: 22 mmol/L (ref 22–32)
Calcium: 8.9 mg/dL (ref 8.9–10.3)
Chloride: 104 mmol/L (ref 101–111)
Creatinine, Ser: 5.9 mg/dL — ABNORMAL HIGH (ref 0.61–1.24)
GFR calc Af Amer: 11 mL/min — ABNORMAL LOW (ref >60)
GFR calc non Af Amer: 10 mL/min — ABNORMAL LOW (ref >60)
Glucose, Bld: 109 mg/dL — ABNORMAL HIGH (ref 65–99)
POTASSIUM: 4.1 mmol/L (ref 3.5–5.1)
Sodium: 140 mmol/L (ref 135–145)
TOTAL PROTEIN: 6.8 g/dL (ref 6.5–8.1)

## 2017-11-17 LAB — PROTIME-INR
INR: 1.45
Prothrombin Time: 17.5 seconds — ABNORMAL HIGH (ref 11.4–15.2)

## 2017-11-17 LAB — PHOSPHORUS: PHOSPHORUS: 2.6 mg/dL (ref 2.5–4.6)

## 2017-11-17 LAB — PLATELET COUNT: Platelets: 15 10*3/uL — CL (ref 150–400)

## 2017-11-17 LAB — AMMONIA: AMMONIA: 76 umol/L — AB (ref 9–35)

## 2017-11-17 MED ORDER — VITAMIN B-1 100 MG PO TABS
100.0000 mg | ORAL_TABLET | Freq: Every day | ORAL | Status: DC
  Administered 2017-11-17 – 2017-11-20 (×3): 100 mg
  Filled 2017-11-17 (×4): qty 1

## 2017-11-17 MED ORDER — MIDODRINE HCL 5 MG PO TABS
10.0000 mg | ORAL_TABLET | Freq: Three times a day (TID) | ORAL | Status: DC
  Administered 2017-11-17 – 2017-11-20 (×7): 10 mg
  Filled 2017-11-17 (×10): qty 2

## 2017-11-17 MED ORDER — DOPAMINE-DEXTROSE 3.2-5 MG/ML-% IV SOLN
0.0000 ug/kg/min | INTRAVENOUS | Status: DC
  Administered 2017-11-17: 5 ug/kg/min via INTRAVENOUS
  Filled 2017-11-17: qty 250

## 2017-11-17 MED ORDER — VITAL AF 1.2 CAL PO LIQD
1000.0000 mL | ORAL | Status: DC
  Administered 2017-11-17 – 2017-11-19 (×3): 1000 mL

## 2017-11-17 MED ORDER — DEXMEDETOMIDINE HCL IN NACL 200 MCG/50ML IV SOLN
0.0000 ug/kg/h | INTRAVENOUS | Status: DC
  Filled 2017-11-17: qty 50

## 2017-11-17 MED ORDER — VITAL HIGH PROTEIN PO LIQD
1000.0000 mL | ORAL | Status: DC
  Administered 2017-11-17: 1000 mL

## 2017-11-17 MED ORDER — FOLIC ACID 1 MG PO TABS
1.0000 mg | ORAL_TABLET | Freq: Every day | ORAL | Status: DC
Start: 2017-11-17 — End: 2017-11-20
  Administered 2017-11-17 – 2017-11-20 (×3): 1 mg
  Filled 2017-11-17 (×4): qty 1

## 2017-11-17 MED ORDER — DEXMEDETOMIDINE HCL IN NACL 400 MCG/100ML IV SOLN
0.0000 ug/kg/h | INTRAVENOUS | Status: DC
  Administered 2017-11-17 – 2017-11-18 (×3): 0.4 ug/kg/h via INTRAVENOUS
  Administered 2017-11-18 (×2): 0.5 ug/kg/h via INTRAVENOUS
  Administered 2017-11-19: 0.7 ug/kg/h via INTRAVENOUS
  Administered 2017-11-19: 0.4 ug/kg/h via INTRAVENOUS
  Administered 2017-11-20: 0.401 ug/kg/h via INTRAVENOUS
  Administered 2017-11-21: 0.4 ug/kg/h via INTRAVENOUS
  Filled 2017-11-17 (×9): qty 100

## 2017-11-17 MED ORDER — RIFAXIMIN 550 MG PO TABS
550.0000 mg | ORAL_TABLET | Freq: Two times a day (BID) | ORAL | Status: DC
  Administered 2017-11-17 – 2017-11-20 (×6): 550 mg
  Filled 2017-11-17 (×7): qty 1

## 2017-11-17 MED ORDER — LACTULOSE 10 GM/15ML PO SOLN
20.0000 g | Freq: Three times a day (TID) | ORAL | Status: DC
  Administered 2017-11-17 – 2017-11-18 (×6): 20 g
  Filled 2017-11-17 (×7): qty 30

## 2017-11-17 MED ORDER — QUETIAPINE FUMARATE 25 MG PO TABS
25.0000 mg | ORAL_TABLET | Freq: Every day | ORAL | Status: DC
  Administered 2017-11-17 – 2017-11-19 (×3): 25 mg
  Filled 2017-11-17 (×3): qty 1

## 2017-11-17 MED ORDER — PRO-STAT SUGAR FREE PO LIQD
30.0000 mL | Freq: Two times a day (BID) | ORAL | Status: DC
  Administered 2017-11-17 – 2017-11-18 (×4): 30 mL
  Filled 2017-11-17 (×5): qty 30

## 2017-11-17 NOTE — Progress Notes (Signed)
eLink Physician-Brief Progress Note Patient Name: [name] DOB: 11/23/1960 MRN: [id number]   Date of Service  11/17/2017  HPI/Events of Note  Thrombocytopenia - Platelet count = 16K. Patient has no evidence of active bleeding. Not sure the Platelet count will stay up for very long if patient is transfused platelets given liver disease and cirrhosis.   eICU Interventions  Will observe for now.      Intervention Category Major Interventions: Other:  [name] 11/17/2017, 3:30 AM

## 2017-11-17 NOTE — Progress Notes (Signed)
[facility] ROUNDING NOTE   Subjective:    57 year old man with a history of diabetes Parkinson compression fractures and alcohol abuse.  He continues to drink.  He presented to the ED with 2 weeks of increasing edema abdominal swelling and lethargy.  Hypoglycemic due to ongoing insulin use. Intubated for airway protection.  Hypotension.  Treated empirically for presumed SBP although quantity of ascites was not sufficient to tap.  Started on lactulose  Creatinine continues to rise to 8.75   today ( 7.7 3/23)   Pretty much oliguric   This is a difficult situation and not sure that dialysis would be indicated  The initial finding would suggest ATN   We have placed on midodrine and octreotide and albumin   Objective:  Vital signs in last 24 hours:  Temp:  [94.3 F (34.6 C)-98.4 F (36.9 C)] 95.9 F (35.5 C) (03/26 1200) Pulse Rate:  [50-110] 76 (03/26 1200) Resp:  [16-28] 24 (03/26 1200) BP: (79-138)/(56-87) 138/78 (03/26 1200) SpO2:  [87 %-100 %] 96 % (03/26 1200) FiO2 (%):  [50 %-60 %] 50 % (03/26 1200) Weight:  [200 lb 2.8 oz (90.8 kg)] 200 lb 2.8 oz (90.8 kg) (03/26 0300)  Weight change: 7 lb 4.4 oz (3.3 kg) Filed Weights   11/15/17 0500 11/16/17 0500 11/17/17 0300  Weight: 194 lb 7.1 oz (88.2 kg) 192 lb 14.4 oz (87.5 kg) 200 lb 2.8 oz (90.8 kg)    Intake/Output: I/O last 3 completed shifts: In: 746.4 [I.V.:199.4; NG/GT:60; IV Piggyback:487] Out: 758 [Urine:45; Other:388; Stool:325]   Intake/Output this shift:  Total I/O In: 505.7 [I.V.:90.9; NG/GT:271.3; IV Piggyback:143.5] Out: 486 [Urine:5; Other:481]  CVS- RRR  Icteric  RS- CTA ABD- BS present soft non-distended EXT- 1 +  edema   Basic Metabolic Panel: Recent Labs  Lab 11/10/17 1745  11/14/17 0625 11/15/17 0336 11/16/17 0401 11/17/17 0212 11/17/17 1014  NA  --    < > 136 139 138 140 141  K  --    < > 4.3 4.1 4.7 4.1 4.1  CL  --    < > 99* 105 103 104 105  CO2  --    < > 20* 21* 18* 22 24   GLUCOSE  --    < > 121* 109* 144* 109* 138*  BUN  --    < > 41* 50* 53* 36* 24*  CREATININE  --    < > 6.82* 7.74* 8.75* 5.90* 4.69*  CALCIUM  --    < > 8.8* 9.0 9.1 8.9 8.6*  MG 2.2  --   --  2.8* 3.0* 2.8* 2.6*  PHOS 4.5  --   --   --   --   --  3.3   < > = values in this interval not displayed.    Liver Function Tests: Recent Labs  Lab 11/12/17 0358 11/13/17 0352 11/13/17 0805 11/15/17 0336 11/16/17 0401 11/17/17 0212 11/17/17 1014  AST 79* 103*  --  94* 95* 91*  --   ALT 32 32  --  38 32 40  --   ALKPHOS 262* 252*  --  207* 197* 192*  --   BILITOT 15.4* QUANTITY NOT SUFFICIENT, UNABLE TO PERFORM TEST 14.6* 13.1* 12.3* 12.1*  --   PROT 6.0* 5.7*  --  5.9* 6.1* 6.8  --   ALBUMIN 2.2* 2.2*  --  2.5* 2.9* 3.4* 3.4*   No results for input(s): LIPASE, AMYLASE in the last 168 hours. Recent Labs  Lab 11/15/17 0336 11/16/17 0401 11/17/17 0210  AMMONIA 122* 96* 76*    CBC: Recent Labs  Lab 11/11/17 0344 11/12/17 0358 11/13/17 0352 11/14/17 0625 11/15/17 0336 11/16/17 0401 11/17/17 0212  WBC 15.2* 18.0* 20.0* 20.0* 17.9* 20.2* 18.7*  NEUTROABS 11.8* 13.6*  --  15.2* 13.9* 16.4*  --   HGB 10.3* 10.2* 10.4* 10.2* 9.3* 9.4* 9.6*  HCT 32.8* 31.9* 31.7* 32.0* 29.1* 30.5* 30.5*  MCV 114.3* 113.1* 112.4* 114.3* 117.3* 117.3* 118.2*  PLT 92* 85* 88* 110* 101* 60* 16*    Cardiac Enzymes: No results for input(s): CKTOTAL, CKMB, CKMBINDEX, TROPONINI in the last 168 hours.  BNP: Invalid input(s): POCBNP  CBG: Recent Labs  Lab 11/16/17 1932 11/16/17 2329 11/17/17 0314 11/17/17 0726 11/17/17 1126  GLUCAP 167* 114* 102* 108* 136*    Microbiology: Results for orders placed or performed during the hospital encounter of 11/07/17  Culture, blood (routine x 2)     Status: None   Collection Time: 11/07/17  5:55 PM  Result Value Ref Range Status   Specimen Description BLOOD RIGHT HAND  Final   Special Requests   Final    BOTTLES DRAWN AEROBIC AND ANAEROBIC Blood  Culture adequate volume   Culture   Final    NO GROWTH 5 DAYS Performed at [facility], [place] [address] [place], [place] [address]    Report Status 11/12/2017 FINAL  Final  Culture, blood (routine x 2)     Status: None   Collection Time: 11/07/17  6:40 PM  Result Value Ref Range Status   Specimen Description BLOOD RIGHT HAND  Final   Special Requests IN PEDIATRIC BOTTLE Blood Culture adequate volume  Final   Culture   Final    NO GROWTH 5 DAYS Performed at [facility], [place] [address], [place], [place] [address]    Report Status 11/12/2017 FINAL  Final  MRSA PCR Screening     Status: None   Collection Time: 11/07/17  8:29 PM  Result Value Ref Range Status   MRSA by PCR NEGATIVE NEGATIVE Final    Comment:        The GeneXpert MRSA Assay (FDA approved for NASAL specimens only), is one component of a comprehensive MRSA colonization surveillance program. It is not intended to diagnose MRSA infection nor to guide or monitor treatment for MRSA infections. Performed at [facility], [place] [address] [place], [place] [address]   Urine Culture     Status: None   Collection Time: 11/08/17  2:09 AM  Result Value Ref Range Status   Specimen Description URINE, RANDOM  Final   Special Requests NONE  Final   Culture   Final    NO GROWTH Performed at [facility], [place] [address] [place], [place] [address]    Report Status 11/09/2017 FINAL  Final  Culture, respiratory (NON-Expectorated)     Status: None   Collection Time: 11/08/17  5:05 AM  Result Value Ref Range Status   Specimen Description TRACHEAL ASPIRATE  Final   Special Requests NONE  Final   Gram Stain   Final    ABUNDANT WBC PRESENT,BOTH PMN AND MONONUCLEAR RARE SQUAMOUS EPITHELIAL CELLS PRESENT ABUNDANT GRAM POSITIVE COCCI IN PAIRS IN CLUSTERS ABUNDANT GRAM NEGATIVE COCCI IN PAIRS RARE GRAM POSITIVE RODS    Culture   Final    MODERATE Consistent with normal respiratory flora. Performed at  [facility], [place] [address] [place], [place] [address]  Report Status 11/10/2017 FINAL  Final  Urine Culture     Status: Abnormal   Collection Time: 11/11/17  4:27 PM  Result Value Ref Range Status   Specimen Description URINE, CATHETERIZED  Final   Special Requests   Final    NONE Performed at [facility], [address] [place], [place] [address]    Culture MULTIPLE SPECIES PRESENT, SUGGEST RECOLLECTION (A)  Final   Report Status 11/12/2017 FINAL  Final  Culture, blood (routine x 2)     Status: None (Preliminary result)   Collection Time: 11/15/17  1:57 PM  Result Value Ref Range Status   Specimen Description BLOOD LEFT HAND  Final   Special Requests   Final    BOTTLES DRAWN AEROBIC ONLY Blood Culture adequate volume   Culture   Final    NO GROWTH 2 DAYS Performed at [facility], [place] [address] [place], [place] [address]    Report Status PENDING  Incomplete  Culture, blood (routine x 2)     Status: None (Preliminary result)   Collection Time: 11/15/17  2:37 PM  Result Value Ref Range Status   Specimen Description BLOOD LEFT HAND  Final   Special Requests IN PEDIATRIC BOTTLE Blood Culture adequate volume  Final   Culture   Final    NO GROWTH 2 DAYS Performed at [facility], [place] [address], [place], [place] [address]    Report Status PENDING  Incomplete    Coagulation Studies: Recent Labs    11/15/17 0336 11/16/17 0401 11/17/17 0212  LABPROT 17.5* 17.8* 17.5*  INR 1.45 1.48 1.45    Urinalysis: No results for input(s): COLORURINE, LABSPEC, PHURINE, GLUCOSEU, HGBUR, BILIRUBINUR, KETONESUR, PROTEINUR, UROBILINOGEN, NITRITE, LEUKOCYTESUR in the last 72 hours.  Invalid input(s): APPERANCEUR    Imaging: US Abdomen Limited  Result Date: 11/17/2017 CLINICAL DATA:  Hepatic cirrhosis EXAM: ULTRASOUND ABDOMEN LIMITED RIGHT UPPER QUADRANT COMPARISON:  Abdominal and pelvic CT scan of November 13, 2017 FINDINGS: Gallbladder: The gallbladder is  surgically absent. Common bile duct: Diameter: 5.3 mm Liver: The hepatic echotexture is mildly increased and is heterogeneous. The surface contour of the liver is fairly smooth. There is a small amount of ascites. No focal mass or ductal dilation is observed. Portal vein is patent on color Doppler imaging with normal direction of blood flow towards the liver. IMPRESSION: Ascites. Increased hepatic echotexture likely reflects cirrhosis. No suspicious hepatic masses. Previous cholecystectomy.  Normal caliber common bile duct. Electronically Signed   By: David  Martinique M.D.   On: 11/17/2017 11:13   Portable Chest X-ray  Result Date: 11/16/2017 CLINICAL DATA:  ETT placement EXAM: PORTABLE CHEST 1 VIEW COMPARISON:  11/16/2017, 11/11/2017, 11/08/2017, CT chest 10/16/2017 FINDINGS: Endotracheal tube tip is at or just above the carina. Right-sided central venous catheter tip overlies the SVC. Esophageal tube tip is below the diaphragm but not included. Stable enlarged cardiomediastinal silhouette. Similar foci of ground-glass opacity in the upper lobes with streaky atelectasis at the left base. Old rib fractures. IMPRESSION: 1. Endotracheal tube tip at or just above the carina 2. Stable enlarged cardiomediastinal silhouette with persistent left basilar atelectasis. Mild diffuse interstitial opacity suggesting minimal edema; focal ground-glass opacities in the upper lobes may reflect superimposed foci of infection or inflammation. Electronically Signed   By: Donavan Foil M.D.   On: 11/16/2017 18:17   Dg Chest Port 1 View  Result Date: 11/16/2017 CLINICAL DATA:  Central line placement EXAM: PORTABLE CHEST 1 VIEW COMPARISON:  Portable exam 1630 hours compared to 11/14/2017 FINDINGS: RIGHT jugular central venous catheter with tip projecting over SVC. Enlargement of cardiac silhouette with pulmonary vascular congestion. Mild chronic interstitial infiltrates likely pulmonary edema, see question slightly increased in LEFT  upper lobe. No pleural effusion or pneumothorax. Bones demineralized with malleable plates again identified at the sternum. Old LEFT rib fractures. IMPRESSION: No pneumothorax following RIGHT jugular line placement. Probable mild pulmonary edema, question minimally increased Electronically Signed   By: Lavonia Dana M.D.   On: 11/16/2017 17:09   Dg Abd Portable 1v  Result Date: 11/16/2017 CLINICAL DATA:  OG tube placement EXAM: PORTABLE ABDOMEN - 1 VIEW COMPARISON:  CT 10/16/2017 FINDINGS: Esophageal tube tip is coiled in the region of the distal stomach. Surgical clips in the right upper quadrant. Gas-filled loops of bowel in the central abdomen. Treated compression of the spine. IMPRESSION: Esophageal tube tip is coiled within the distal stomach. Electronically Signed   By: Donavan Foil M.D.   On: 11/16/2017 18:18   Dg Abd Portable 1v  Result Date: 11/16/2017 CLINICAL DATA:  Abdominal distension EXAM: PORTABLE ABDOMEN - 1 VIEW COMPARISON:  Portable exam 1628 hours compared to CT abdomen and pelvis 10/16/2017 FINDINGS: Gaseous distention of stomach. Air-filled normal sized small bowel loops in mid abdomen. Paucity of colonic gas. No definite bowel wall thickening or obstruction. Bones demineralized with old appearing compression deformities at T12 and L1 and prior spinal augmentation procedure at L2. IMPRESSION: Gaseous distention of stomach. Electronically Signed   By: Lavonia Dana M.D.   On: 11/16/2017 17:08     Medications:   . sodium chloride 10 mL/hr at 11/17/17 0600  . albumin human    . dexmedetomidine (PRECEDEX) IV infusion 0.4 mcg/kg/hr (11/17/17 1031)  . DOPamine 2 mcg/kg/min (11/17/17 1200)  . famotidine (PEPCID) IV Stopped (11/16/17 1827)  . feeding supplement (VITAL AF 1.2 CAL)    . piperacillin-tazobactam Stopped (11/17/17 0944)  . dialysis replacement fluid (prismasate) 200 mL/hr at 11/16/17 1816  . dialysis replacement fluid (prismasate) 300 mL/hr at 11/17/17 1135  . dialysate  (PRISMASATE) 2,000 mL/hr at 11/17/17 1233  . sodium chloride     . chlorhexidine gluconate (MEDLINE KIT)  15 mL Mouth Rinse BID  . Chlorhexidine Gluconate Cloth  6 each Topical Daily  . feeding supplement (PRO-STAT SUGAR FREE 64)  30 mL Per Tube BID  . folic acid  1 mg Per Tube Daily  . heparin injection (subcutaneous)  5,000 Units Subcutaneous Q8H  . insulin aspart  0-15 Units Subcutaneous Q4H  . ipratropium-albuterol  3 mL Nebulization TID  . lactulose  20 g Per Tube TID  . mouth rinse  15 mL Mouth Rinse 10 times per day  . midodrine  10 mg Per Tube TID WC  . octreotide  200 mcg Subcutaneous BID  . QUEtiapine  25 mg Per Tube QHS  . rifaximin  550 mg Per Tube BID  . sodium chloride flush  10-40 mL Intracatheter Q12H  . thiamine  100 mg Per Tube Daily   sodium chloride, albuterol, fentaNYL (SUBLIMAZE) injection, fentaNYL (SUBLIMAZE) injection, heparin, HYDROmorphone, sodium chloride, sodium chloride flush  Assessment/ Plan:   Acute kidney injury  Thought to be ATN although certainly would fit with hepatorenal syndrome - we are trying midodrine octreotide and albumin  He has acute alcoholic cirrhosis  Started on CRRT  Anemia   --  Hb 9.3  Follow with critical care   Check iron stores  Cirrhosis  -  INR 1.45   Lactulose     Encephalopathy   Waxes and wanes   Acid, base and electrolytes reviewed  Very challenging situation Discussed with father and outlined poor prognosis. Stable on CRRT        LOS: 10 Kammie Scioli W _0 _1 :59 PM

## 2017-11-17 NOTE — Progress Notes (Signed)
eLink Physician-Brief Progress Note Patient Name: [name] DOB: 03/22/1961 MRN: [id number]   Date of Service  11/17/2017  HPI/Events of Note   Bradycardia - HR now = 49. BP = 86/66. Don/t see anything on the medication list that would cause bradycardia. Last K+ = 4.7.   eICU Interventions  Will order:: 1. Send morning labs early. Need to recheck K+.  2. Dopamine IV infusion. Titrate to MAP > 65 and SBP > 90.     Intervention Category Major Interventions: Arrhythmia - evaluation and management  Sommer,Steven Eugene 11/17/2017, 12:52 AM

## 2017-11-17 NOTE — Progress Notes (Signed)
SLP Cancellation Note  Patient Details Name: [name] MRN: [id number] DOB: 03/02/1961   Cancelled treatment:       Reason Eval/Treat Not Completed: Medical issues which prohibited therapy. Pt with change in status on previous date and now intubated. Will continue to follow.   [name], Kerrianne Jeng 11/17/2017, 8:46 AM  [name], M.A. CCC-SLP [phone]

## 2017-11-17 NOTE — Progress Notes (Signed)
CRITICAL VALUE ALERT  Critical Value:  Platelets 16  Date & Time Notied: 11/17/17 0320  Provider Notified: Pola CornElink MD  Orders Received/Actions taken: Continue to monitor for bleeding

## 2017-11-17 NOTE — Progress Notes (Signed)
  [facility] TEAM 1 - Stepdown/ICU TEAM  The pt required intubation yesterday afternoon, and thus has been transferred to the care of the PCCM Team.  TRH will sign off.  Lonia BloodJeffrey T. Nyasia Baxley, MD [facility] Office  [phone] Pager - Text Page per Amion as per below:  On-Call/Text Page:      [email or web address]      password [email or web address]  If 7PM-7AM, please contact night-coverage [email or web address] Password [email or web address] 11/17/2017, 8:20 AM

## 2017-11-17 NOTE — Progress Notes (Addendum)
PULMONARY / CRITICAL CARE MEDICINE   Name: [name] MRN: [id number] DOB: 1960-08-26    ADMISSION DATE:  11/07/2017 CONSULTATION DATE:  11/07/2017  REFERRING MD:  EDP  CHIEF COMPLAINT:  Abdominal swelling, encephalopath  HISTORY OF PRESENT ILLNESS:   57 yo male with a history of cirrhosis and etoh use, cad, and DM2 was admitted with decompensated hepatic encephalopathy.  After admission he developed worsening renal failure.  PCCM called back to see him 3/25 in the setting of worsening encephalopathy and AKI.    PAST MEDICAL HISTORY :  He  has a past medical history of Chronic lower back pain, Compression fracture, Diabetes mellitus, Fracture acetabulum-closed ([place]) (04/09/2013), GERD (gastroesophageal reflux disease), H/O Legionnaire's disease, Hepatomegaly, Migraine, Osteoporosis, Pancreatitis, PTSD (post-traumatic stress disorder), and Vitamin D deficiency.  PAST SURGICAL HISTORY: He  has a past surgical history that includes Cholecystectomy; Hernia repair; Laminectomy; Back surgery; Sternum fracture surgery; Kyphoplasty (N/A, 07/16/2015); and Mass excision (Left, 06/12/2017).  Allergies  Allergen Reactions  . Ketorolac Tromethamine Hives  . Temsirolimus Other (See Comments)    Unknown - Pt does not recognize this medication [facility]    No current facility-administered medications on file prior to encounter.    Current Outpatient Medications on File Prior to Encounter  Medication Sig  . albuterol (PROVENTIL HFA;VENTOLIN HFA) 108 (90 Base) MCG/ACT inhaler Inhale 2 puffs into the lungs every 6 (six) hours as needed for wheezing or shortness of breath.  . carboxymethylcellulose (REFRESH PLUS) 0.5 % SOLN Place 1 drop into both eyes 3 (three) times daily as needed (dry eyes).  . gabapentin (NEURONTIN) 400 MG capsule Take 3 capsules (1,200 mg total) by mouth 3 (three) times daily.  Marland Kitchen guaiFENesin (MUCINEX) 600 MG 12 hr tablet Take 2 tablets (1,200 mg total) by mouth 2  (two) times daily.  . hydrochlorothiazide (HYDRODIURIL) 25 MG tablet Take 0.5 tablets (12.5 mg total) by mouth 2 (two) times daily.  Marland Kitchen ibuprofen (ADVIL,MOTRIN) 200 MG tablet Take 600 mg by mouth every 6 (six) hours as needed (back pain).  . insulin aspart (NOVOLOG FLEXPEN) 100 UNIT/ML FlexPen INJECT 7 TO 15 UNITS subcu up to 4 times daily (Patient taking differently: INJECT 9 - 17 UNITS SQ UP TO FOUR TIMES A DAY)  . Insulin Glargine (LANTUS SOLOSTAR) 100 UNIT/ML Solostar Pen Inject 37 Units into the skin daily. (Patient taking differently: Inject 33 Units into the skin daily. )  . Magnesium 500 MG TABS Take 500 mg by mouth daily.  . methocarbamol (ROBAXIN) 500 MG tablet Take 500 mg by mouth 2 (two) times daily as needed for muscle spasms.  Marland Kitchen morphine (MSIR) 15 MG tablet Take 15 mg by mouth 3 (three) times daily.   . pantoprazole (PROTONIX) 40 MG tablet Take 1 tablet (40 mg total) by mouth daily.  . polyethylene glycol (MIRALAX / GLYCOLAX) packet Take 17 g by mouth 2 (two) times daily.  Marland Kitchen POTASSIUM PO Take 1 tablet by mouth at bedtime as needed (leg cramps).  . Blood Glucose Monitoring Suppl (ACCU-CHEK AVIVA PLUS) w/Device KIT 1 each by Does not apply route daily.  . Glucose Blood (BLOOD GLUCOSE TEST STRIPS) STRP 1 strip by In Vitro route 4 (four) times daily.    FAMILY HISTORY:  His indicated that his mother is alive. He indicated that his father is alive. He indicated that his sister is alive. He indicated that his brother is alive. He indicated that the status of his unknown relative is unknown.   SOCIAL HISTORY:  He  reports that he quit smoking about 10 years ago. His smoking use included cigarettes. He has a 13.00 pack-year smoking history. He has never used smokeless tobacco. He reports that he drinks about 3.6 oz of alcohol per week. He reports that he does not use drugs.  REVIEW OF SYSTEMS:   Cannot obtain  SUBJECTIVE:  Not protecting airway  VITAL SIGNS: BP 105/66 (BP Location:  Right Arm)   Pulse 66   Temp 98.2 F (36.8 C) (Core)   Resp (!) 24   Ht _0  (1.727 m)   Wt 200 lb 2.8 oz (90.8 kg)   SpO2 93%   BMI 30.44 kg/m   HEMODYNAMICS: CVP:  [11 mmHg-16 mmHg] 11 mmHg  VENTILATOR SETTINGS: Vent Mode: PRVC FiO2 (%):  [50 %-60 %] 50 % Set Rate:  [16 bmp-24 bmp] 24 bmp Vt Set:  [550 mL] 550 mL PEEP:  [5 cmH20] 5 cmH20 Plateau Pressure:  [23 cmH20-27 cmH20] 27 cmH20  INTAKE / OUTPUT: I/O last 3 completed shifts: In: 746.4 [I.V.:199.4; NG/GT:60; IV Piggyback:487] Out: 758 [Urine:45; Other:388; Stool:325]  PHYSICAL EXAMINATION:  General:  Increased work of breathing HENT: NCAT OP clear PULM: CTA B, normal effort CV: RRR, no mgr GI: BS+, soft, nontender MSK: normal bulk and tone Neuro: no response to external stimuli   LABS:  BMET Recent Labs  Lab 11/15/17 0336 11/16/17 0401 11/17/17 0212  NA 139 138 140  K 4.1 4.7 4.1  CL 105 103 104  CO2 21* 18* 22  BUN 50* 53* 36*  CREATININE 7.74* 8.75* 5.90*  GLUCOSE 109* 144* 109*    Electrolytes Recent Labs  Lab 11/10/17 1745  11/15/17 0336 11/16/17 0401 11/17/17 0212  CALCIUM  --    < > 9.0 9.1 8.9  MG 2.2  --  2.8* 3.0* 2.8*  PHOS 4.5  --   --   --   --    < > = values in this interval not displayed.    CBC Recent Labs  Lab 11/15/17 0336 11/16/17 0401 11/17/17 0212  WBC 17.9* 20.2* 18.7*  HGB 9.3* 9.4* 9.6*  HCT 29.1* 30.5* 30.5*  PLT 101* 60* 16*    Coag's Recent Labs  Lab 11/15/17 0336 11/16/17 0401 11/17/17 0212  INR 1.45 1.48 1.45    Sepsis Markers No results for input(s): LATICACIDVEN, PROCALCITON, O2SATVEN in the last 168 hours.  ABG Recent Labs  Lab 11/14/17 0917 11/16/17 1839  PHART 7.351 7.232*  PCO2ART 35.3 46.9  PO2ART 46.0* 78.0*    Liver Enzymes Recent Labs  Lab 11/15/17 0336 11/16/17 0401 11/17/17 0212  AST 94* 95* 91*  ALT 38 32 40  ALKPHOS 207* 197* 192*  BILITOT 13.1* 12.3* 12.1*  ALBUMIN 2.5* 2.9* 3.4*    Cardiac Enzymes No  results for input(s): TROPONINI, PROBNP in the last 168 hours.  Glucose Recent Labs  Lab 11/16/17 1113 11/16/17 1533 11/16/17 1932 11/16/17 2329 11/17/17 0314 11/17/17 0726  GLUCAP 118* 239* 167* 114* 102* 108*    Imaging Portable Chest X-ray  Result Date: 11/16/2017 CLINICAL DATA:  ETT placement EXAM: PORTABLE CHEST 1 VIEW COMPARISON:  11/16/2017, 11/11/2017, 11/08/2017, CT chest 10/16/2017 FINDINGS: Endotracheal tube tip is at or just above the carina. Right-sided central venous catheter tip overlies the SVC. Esophageal tube tip is below the diaphragm but not included. Stable enlarged cardiomediastinal silhouette. Similar foci of ground-glass opacity in the upper lobes with streaky atelectasis at the left base. Old rib fractures. IMPRESSION: 1. Endotracheal tube  tip at or just above the carina 2. Stable enlarged cardiomediastinal silhouette with persistent left basilar atelectasis. Mild diffuse interstitial opacity suggesting minimal edema; focal ground-glass opacities in the upper lobes may reflect superimposed foci of infection or inflammation. Electronically Signed   By: Donavan Foil M.D.   On: 11/16/2017 18:17   Dg Chest Port 1 View  Result Date: 11/16/2017 CLINICAL DATA:  Central line placement EXAM: PORTABLE CHEST 1 VIEW COMPARISON:  Portable exam 1630 hours compared to 11/14/2017 FINDINGS: RIGHT jugular central venous catheter with tip projecting over SVC. Enlargement of cardiac silhouette with pulmonary vascular congestion. Mild chronic interstitial infiltrates likely pulmonary edema, see question slightly increased in LEFT upper lobe. No pleural effusion or pneumothorax. Bones demineralized with malleable plates again identified at the sternum. Old LEFT rib fractures. IMPRESSION: No pneumothorax following RIGHT jugular line placement. Probable mild pulmonary edema, question minimally increased Electronically Signed   By: Lavonia Dana M.D.   On: 11/16/2017 17:09   Dg Abd Portable  1v  Result Date: 11/16/2017 CLINICAL DATA:  OG tube placement EXAM: PORTABLE ABDOMEN - 1 VIEW COMPARISON:  CT 10/16/2017 FINDINGS: Esophageal tube tip is coiled in the region of the distal stomach. Surgical clips in the right upper quadrant. Gas-filled loops of bowel in the central abdomen. Treated compression of the spine. IMPRESSION: Esophageal tube tip is coiled within the distal stomach. Electronically Signed   By: Donavan Foil M.D.   On: 11/16/2017 18:18   Dg Abd Portable 1v  Result Date: 11/16/2017 CLINICAL DATA:  Abdominal distension EXAM: PORTABLE ABDOMEN - 1 VIEW COMPARISON:  Portable exam 1628 hours compared to CT abdomen and pelvis 10/16/2017 FINDINGS: Gaseous distention of stomach. Air-filled normal sized small bowel loops in mid abdomen. Paucity of colonic gas. No definite bowel wall thickening or obstruction. Bones demineralized with old appearing compression deformities at T12 and L1 and prior spinal augmentation procedure at L2. IMPRESSION: Gaseous distention of stomach. Electronically Signed   By: Lavonia Dana M.D.   On: 11/16/2017 17:08    STUDIES:    CULTURES: 11/07/2017 blood cultures x2>> 11/07/2017 urine culture>> 11/07/2017 paracentesis >> not done, no significant fluid  ANTIBIOTICS: Ceftriaxone 3/16 >> 3.22 vancomycin 3/17 >> 3/18 Zosyn 3/24 >>    SIGNIFICANT EVENTS:   LINES/TUBES: ETT 3/17 >>  3/25 >   DISCUSSION: 57 y/o male with worsening AKI and encephalopathy in the setting of baseline cirrhosis.  Likely aspirated based on exam 3/25.  ASSESSMENT / PLAN:  PULMONARY A: Aspiration Acute respiratory failure due to inability to protect airway P:   Full mechanical vent support VAP prevention Daily WUA/SBT Intubate  CARDIOVASCULAR A:  CAD P:  Tele  RENAL A:   Worsening AKI, ATN vs hepatorenal P:   Continue CVVHD  GASTROINTESTINAL A:   Alcoholic Cirrhosis P:   minimize sedation Start tube feeding Continue rifaximin Abdominal  ultrasound: is there ascites?  HEMATOLOGIC A:   Anemia no bleeding Acute on chronic thrombocytopenia > spurious? P:  Monitor for bleeding Repeat CBC  INFECTIOUS A:   Aspiration? SBP? P:   Send resp culture Monitor for fever  ENDOCRINE A:   No acute issues  P:   Monitor glucose  NEUROLOGIC A:   Hepatic encephalopathy vs encephalopathy from renal diseae P:   RASS goal 0 Continue intermittent fentanyl per PAD protocol rifaximin  FAMILY  - Updates: Updated wife on 3/25, she is amenable to short term aggressive measures but understands his poor prognosis.  - Inter-disciplinary family meet or Palliative  Care meeting due by:  day 7  My cc time 33 minutes  Roselie Awkward, MD [facility] Pager: [phone] Cell: [phone] After 3pm or if no response, call [phone]     11/17/2017, 9:46 AM

## 2017-11-17 NOTE — Progress Notes (Signed)
Nutrition Follow-up /Consult  DOCUMENTATION CODES:   Not applicable  INTERVENTION:   Vital 1.2 at 5465mL/hr x24 hours with 30mL prostat BID; This provides a total volume of 1620mL, 2072kcal (105% of needs), 147g (100% of needs), and 1264mL free water.  NUTRITION DIAGNOSIS:   Inadequate oral intake related to lethargy/confusion as evidenced by meal completion < 50%. Ongoing.  GOAL:   Patient will meet greater than or equal to 90% of their needs Not meeting.  MONITOR:   PO intake, Supplement acceptance, Labs, I & O's  REASON FOR ASSESSMENT:   Ventilator, Consult Enteral/tube feeding initiation and management  ASSESSMENT:   57 yo male with PMH of GERD, osteoporosis, vitamin D deficiency, hepatomegaly, alcohol abuse, pancreatitis, DM, compression fracture, Legionnaire's DZ, PTSD who was admitted on 3/16 with decompensated cirrhosis with hepatic encephalopathy, hypoglycemia. Required intubation on 3/17.   Pt extubated 3/20; diet advanced to Dysphagia 2 with thin liquids. Pt re-intubated 3/25  Pt now on CRRT. Pt discussed with RN at bedside; CRRT not pulling off much fluid and pt is not making much urine.  Net positive 4.3L since admit. 35mL urine output x24 hours.  325mL stool output x24 hours. 145mL total CRRT output  Pt MAP >65 Dopamine at steady rate. OGT placed 3/25  Pt wt has fluctuated throughout admission. Admission wt of 195lb 15.8oz (88.9kg) used to estimate needs. Pt currently on ventilator support with observed rate of 12.9L/min. Tmax x24 hours 36.9  Medications: Folic acid, insulin, lactulose, octreotide, rifaximin, thiamine, albumin, dopamine, pepcid, zosyn.  NS IVF  Labs: Mg (H;2.8), BUN (H;36), Cr (H;5.9) Ammonia 76 Corrected Ca WNL; albumin (L;3.4) CBG (last 3)  Recent Labs    11/16/17 2329 11/17/17 0314 11/17/17 0726  GLUCAP 114* 102* 108*    Diet Order:  Diet NPO time specified  EDUCATION NEEDS:   No education needs have been  identified at this time  Skin:  Skin Assessment: Skin Integrity Issues: Skin Integrity Issues:: Other (Comment) Other: venous stasis/non pressure injury ulcers bilateral extremities  Last BM:  3/26 type 7. abdomen distended/taut with hypoactive bowel sounds.  Height:   Ht Readings from Last 1 Encounters:  11/07/17 5\' 8"  (1.727 m)    Weight:   Wt Readings from Last 1 Encounters:  11/17/17 200 lb 2.8 oz (90.8 kg)    Ideal Body Weight:  70 kg  BMI:  Body mass index is 30.44 kg/m.  Estimated Nutritional Needs:   Kcal:  1981  Protein:  133-177g  Fluid:  1.9L   Idan Prime, MS, Dietetic Intern Pager # [phone]

## 2017-11-18 DIAGNOSIS — J9600 Acute respiratory failure, unspecified whether with hypoxia or hypercapnia: Secondary | ICD-10-CM

## 2017-11-18 LAB — COMPREHENSIVE METABOLIC PANEL
ALBUMIN: 3.3 g/dL — AB (ref 3.5–5.0)
ALT: 36 U/L (ref 17–63)
ANION GAP: 12 (ref 5–15)
AST: 80 U/L — AB (ref 15–41)
Alkaline Phosphatase: 170 U/L — ABNORMAL HIGH (ref 38–126)
BILIRUBIN TOTAL: 12.3 mg/dL — AB (ref 0.3–1.2)
BUN: 13 mg/dL (ref 6–20)
CO2: 25 mmol/L (ref 22–32)
Calcium: 8.6 mg/dL — ABNORMAL LOW (ref 8.9–10.3)
Chloride: 102 mmol/L (ref 101–111)
Creatinine, Ser: 2.75 mg/dL — ABNORMAL HIGH (ref 0.61–1.24)
GFR calc Af Amer: 28 mL/min — ABNORMAL LOW (ref >60)
GFR calc non Af Amer: 24 mL/min — ABNORMAL LOW (ref >60)
GLUCOSE: 198 mg/dL — AB (ref 65–99)
POTASSIUM: 4.1 mmol/L (ref 3.5–5.1)
SODIUM: 139 mmol/L (ref 135–145)
Total Protein: 6.5 g/dL (ref 6.5–8.1)

## 2017-11-18 LAB — CBC WITH DIFFERENTIAL/PLATELET
BASOS ABS: 0.2 10*3/uL — AB (ref 0.0–0.1)
Basophils Relative: 1 %
EOS PCT: 1 %
Eosinophils Absolute: 0.2 10*3/uL (ref 0.0–0.7)
HEMATOCRIT: 32.3 % — AB (ref 39.0–52.0)
HEMOGLOBIN: 9.8 g/dL — AB (ref 13.0–17.0)
LYMPHS PCT: 9 %
Lymphs Abs: 2 10*3/uL (ref 0.7–4.0)
MCH: 35.9 pg — ABNORMAL HIGH (ref 26.0–34.0)
MCHC: 30.3 g/dL (ref 30.0–36.0)
MCV: 118.3 fL — ABNORMAL HIGH (ref 78.0–100.0)
MONOS PCT: 7 %
Monocytes Absolute: 1.6 10*3/uL — ABNORMAL HIGH (ref 0.1–1.0)
NEUTROS PCT: 82 %
Neutro Abs: 18.3 10*3/uL — ABNORMAL HIGH (ref 1.7–7.7)
Platelets: 16 10*3/uL — CL (ref 150–400)
RBC: 2.73 MIL/uL — AB (ref 4.22–5.81)
RDW: 19.2 % — ABNORMAL HIGH (ref 11.5–15.5)
WBC: 22.3 10*3/uL — AB (ref 4.0–10.5)

## 2017-11-18 LAB — RENAL FUNCTION PANEL
ANION GAP: 12 (ref 5–15)
Albumin: 3.3 g/dL — ABNORMAL LOW (ref 3.5–5.0)
BUN: 11 mg/dL (ref 6–20)
CO2: 23 mmol/L (ref 22–32)
Calcium: 8.4 mg/dL — ABNORMAL LOW (ref 8.9–10.3)
Chloride: 104 mmol/L (ref 101–111)
Creatinine, Ser: 2.11 mg/dL — ABNORMAL HIGH (ref 0.61–1.24)
GFR calc Af Amer: 39 mL/min — ABNORMAL LOW (ref >60)
GFR calc non Af Amer: 34 mL/min — ABNORMAL LOW (ref >60)
GLUCOSE: 209 mg/dL — AB (ref 65–99)
POTASSIUM: 4.1 mmol/L (ref 3.5–5.1)
Phosphorus: 2.3 mg/dL — ABNORMAL LOW (ref 2.5–4.6)
Sodium: 139 mmol/L (ref 135–145)

## 2017-11-18 LAB — GLUCOSE, CAPILLARY
GLUCOSE-CAPILLARY: 191 mg/dL — AB (ref 65–99)
GLUCOSE-CAPILLARY: 178 mg/dL — AB (ref 65–99)
Glucose-Capillary: 197 mg/dL — ABNORMAL HIGH (ref 65–99)
Glucose-Capillary: 198 mg/dL — ABNORMAL HIGH (ref 65–99)
Glucose-Capillary: 190 mg/dL — ABNORMAL HIGH (ref 65–99)
Glucose-Capillary: 200 mg/dL — ABNORMAL HIGH (ref 65–99)

## 2017-11-18 LAB — AMMONIA: Ammonia: 54 umol/L — ABNORMAL HIGH (ref 9–35)

## 2017-11-18 LAB — PHOSPHORUS
PHOSPHORUS: 1.9 mg/dL — AB (ref 2.5–4.6)
Phosphorus: 2.3 mg/dL — ABNORMAL LOW (ref 2.5–4.6)

## 2017-11-18 LAB — PROTIME-INR
INR: 1.54
Prothrombin Time: 18.3 seconds — ABNORMAL HIGH (ref 11.4–15.2)

## 2017-11-18 LAB — MAGNESIUM
Magnesium: 2.6 mg/dL — ABNORMAL HIGH (ref 1.7–2.4)
Magnesium: 2.5 mg/dL — ABNORMAL HIGH (ref 1.7–2.4)

## 2017-11-18 MED ORDER — POTASSIUM PHOSPHATES 15 MMOLE/5ML IV SOLN
20.0000 meq | Freq: Once | INTRAVENOUS | Status: AC
  Administered 2017-11-18: 20 meq via INTRAVENOUS
  Filled 2017-11-18: qty 4.55

## 2017-11-18 MED ORDER — HYDROMORPHONE HCL 2 MG PO TABS
1.0000 mg | ORAL_TABLET | Freq: Four times a day (QID) | ORAL | Status: DC | PRN
  Administered 2017-11-18 – 2017-11-20 (×4): 1 mg
  Filled 2017-11-18 (×4): qty 1

## 2017-11-18 NOTE — Progress Notes (Signed)
PT Cancellation Note  Patient Details Name: [name] P Shambley MRN: [id number] DOB: 11/29/1960   Cancelled Treatment:    Reason Eval/Treat Not Completed: Medical issues which prohibited therapy(Sign off due to medical issues.  Reorder when appropriate. ) Nurse agrees.    Amadeo GarnetDawn F Mando Blatz 11/18/2017, 10:44 AM Eber Jonesawn Jyaire Koudelka,PT Acute Rehabilitation [phone] (pager)

## 2017-11-18 NOTE — Progress Notes (Signed)
CSW aware that pt is being recommended for SNF. Pt remains intubated at this time. CSW will continue to follow for discharge placement needs and to assess when appropriate. CSW remains available for support of family and pt at this time.   [name] MangesKierra S. Jesusa Stenerson, MSW, LCSW-A Emergency Department Clinical Social Worker [phone]

## 2017-11-18 NOTE — Progress Notes (Signed)
PULMONARY / CRITICAL CARE MEDICINE   Name: [name] MRN: [id number] DOB: [birth year]    ADMISSION DATE:  11/07/2017 CONSULTATION DATE:  11/07/2017 LOS 11 days  REFERRING MD:  EDP  CHIEF COMPLAINT:  Abdominal swelling, encephalopath  57 yo male with a history of cirrhosis and etoh use, cad, and DM2 was admitted with decompensated hepatic encephalopathy.  After admission he developed worsening renal failure.  PCCM called back to see him 3/25 in the setting of worsening encephalopathy and AKI.      CULTURES: 11/07/2017 blood cultures x2>> 11/07/2017 urine culture>> 11/07/2017 paracentesis >> not done, no significant fluid  ANTIBIOTICS: Ceftriaxone 3/16 >> 3.22 vancomycin 3/17 >> 3/18 Zosyn 3/24 >>    LINES/TUBES: ETT 3/17 >>  3/20, 3/25 >  3/25 - CRRT   SUBJECTIVE/OVERNIGHT/INTERVAL HX 11/18/2017  -  MELD score 34 points -> 52% 3 mortality. Per RN / pharm got reintubated after ativan for HD cath and got intubated on 11/16/17. Per care team - full code as of 11/16/17. On dopamine for bradycardia started 2 night ago. On precdexe. Current HR 57 and MAP 98  . Patient is anuric. Platelet 16  Patient getting albumin scheduled + lactulose + xifxan + midodrine + octreotide     VITAL SIGNS: BP (!) 144/77   Pulse (!) 51   Temp (!) 97 F (36.1 C)   Resp 19   Ht 5\' 8"  (1.727 m)   Wt 85.6 kg (188 lb 11.4 oz)   SpO2 100%   BMI 28.69 kg/m   HEMODYNAMICS:    VENTILATOR SETTINGS: Vent Mode: PSV;CPAP FiO2 (%):  [0.8 %-50 %] 40 % Set Rate:  [24 bmp] 24 bmp Vt Set:  [550 mL] 550 mL PEEP:  [5 cmH20] 5 cmH20 Pressure Support:  [10 cmH20] 10 cmH20 Plateau Pressure:  [26 cmH20-33 cmH20] 29 cmH20  INTAKE / OUTPUT: I/O last 3 completed shifts: In: 2890.5 [I.V.:652.1; NG/GT:1701.3; IV Piggyback:537] Out: 3640 [Urine:10; Other:3055; Stool:575]  PHYSICAL EXAMINATION:  General Appearance:    Looks criticall ill/ VERY JAUNDICED  Head:    Normocephalic, without obvious  abnormality, atraumatic  Eyes:    PERRL - yes, conjunctiva/corneas - clear      Ears:    Normal external ear canals, both ears  Nose:   NG tube - no  Throat:  ETT TUBE - yes , OG tube - yes  Neck:   Supple,  No enlargement/tenderness/nodules     Lungs:     Clear to auscultation bilaterally,  Chest wall:    No deformity  Heart:    S1 and S2 normal, no murmur, CVP - no.  Pressors - just came off dopamine  Abdomen:     Soft, no masses, no organomegaly  Genitalia:    Not done  Rectal:   not done  Extremities:   Extremities- edema, jaundiced     Skin:   Intact in exposed areas .      Neurologic:   Sedation - precedex -> RASS - 0 . Moves all 4s - yes. CAM-ICU - likely negative . Orientation - partially oriented +      PULMONARY Recent Labs  Lab 11/14/17 0917 11/16/17 1839  PHART 7.351 7.232*  PCO2ART 35.3 46.9  PO2ART 46.0* 78.0*  HCO3 19.7* 19.9*  TCO2 21* 21*  O2SAT 81.0 94.0    CBC Recent Labs  Lab 11/16/17 0401 11/17/17 0212 11/17/17 1115 11/18/17 0341  HGB 9.4* 9.6*  --  9.8*  HCT 30.5* 30.5*  --  32.3*  WBC 20.2* 18.7*  --  22.3*  PLT 60* 16* 15* 16*    COAGULATION Recent Labs  Lab 11/14/17 0625 11/15/17 0336 11/16/17 0401 11/17/17 0212 11/18/17 0341  INR 1.35 1.45 1.48 1.45 1.54    CARDIAC  No results for input(s): TROPONINI in the last 168 hours. No results for input(s): PROBNP in the last 168 hours.   CHEMISTRY Recent Labs  Lab 11/16/17 0401 11/17/17 0212 11/17/17 1014 11/17/17 1545 11/18/17 0341  NA 138 140 141 138 139  K 4.7 4.1 4.1 3.9 4.1  CL 103 104 105 102 102  CO2 18* 22 24 23 25   GLUCOSE 144* 109* 138* 171* 198*  BUN 53* 36* 24* 21* 13  CREATININE 8.75* 5.90* 4.69* 3.75* 2.75*  CALCIUM 9.1 8.9 8.6* 8.5* 8.6*  MG 3.0* 2.8* 2.6* 2.6* 2.6*  PHOS  --   --  3.3 2.6  2.5 1.9*   Estimated Creatinine Clearance: 32.3 mL/min (A) (by C-G formula based on SCr of 2.75 mg/dL (H)).   LIVER Recent Labs  Lab 11/13/17 0352  11/13/17 0805 11/14/17 0625 11/15/17 0336 11/16/17 0401 11/17/17 0212 11/17/17 1014 11/17/17 1545 11/18/17 0341  AST 103*  --   --  94* 95* 91*  --   --  80*  ALT 32  --   --  38 32 40  --   --  36  ALKPHOS 252*  --   --  207* 197* 192*  --   --  170*  BILITOT QUANTITY NOT SUFFICIENT, UNABLE TO PERFORM TEST 14.6*  --  13.1* 12.3* 12.1*  --   --  12.3*  PROT 5.7*  --   --  5.9* 6.1* 6.8  --   --  6.5  ALBUMIN 2.2*  --   --  2.5* 2.9* 3.4* 3.4* 3.2* 3.3*  INR  --   --  1.35 1.45 1.48 1.45  --   --  1.54     INFECTIOUS No results for input(s): LATICACIDVEN, PROCALCITON in the last 168 hours.   ENDOCRINE CBG (last 3)  Recent Labs    11/17/17 2342 11/18/17 0357 11/18/17 0730  GLUCAP 192* 191* 178*         IMAGING x48h  - image(s) personally visualized  -   highlighted in bold [place] Abdomen Limited  Result Date: 11/17/2017 CLINICAL DATA:  Hepatic cirrhosis EXAM: ULTRASOUND ABDOMEN LIMITED RIGHT UPPER QUADRANT COMPARISON:  Abdominal and pelvic CT scan of November 13, 2017 FINDINGS: Gallbladder: The gallbladder is surgically absent. Common bile duct: Diameter: 5.3 mm Liver: The hepatic echotexture is mildly increased and is heterogeneous. The surface contour of the liver is fairly smooth. There is a small amount of ascites. No focal mass or ductal dilation is observed. Portal vein is patent on color Doppler imaging with normal direction of blood flow towards the liver. IMPRESSION: Ascites. Increased hepatic echotexture likely reflects cirrhosis. No suspicious hepatic masses. Previous cholecystectomy.  Normal caliber common bile duct. Electronically Signed   By: David  SwazilandJordan M.D.   On: 11/17/2017 11:13   Portable Chest X-ray  Result Date: 11/16/2017 CLINICAL DATA:  ETT placement EXAM: PORTABLE CHEST 1 VIEW COMPARISON:  11/16/2017, 11/11/2017, 11/08/2017, CT chest 10/16/2017 FINDINGS: Endotracheal tube tip is at or just above the carina. Right-sided central venous catheter tip overlies  the SVC. Esophageal tube tip is below the diaphragm but not included. Stable enlarged cardiomediastinal silhouette. Similar foci of ground-glass opacity in the upper lobes with streaky atelectasis at the left  base. Old rib fractures. IMPRESSION: 1. Endotracheal tube tip at or just above the carina 2. Stable enlarged cardiomediastinal silhouette with persistent left basilar atelectasis. Mild diffuse interstitial opacity suggesting minimal edema; focal ground-glass opacities in the upper lobes may reflect superimposed foci of infection or inflammation. Electronically Signed   By: Jasmine Pang M.D.   On: 11/16/2017 18:17   Dg Chest Port 1 View  Result Date: 11/16/2017 CLINICAL DATA:  Central line placement EXAM: PORTABLE CHEST 1 VIEW COMPARISON:  Portable exam 1630 hours compared to 11/14/2017 FINDINGS: RIGHT jugular central venous catheter with tip projecting over SVC. Enlargement of cardiac silhouette with pulmonary vascular congestion. Mild chronic interstitial infiltrates likely pulmonary edema, see question slightly increased in LEFT upper lobe. No pleural effusion or pneumothorax. Bones demineralized with malleable plates again identified at the sternum. Old LEFT rib fractures. IMPRESSION: No pneumothorax following RIGHT jugular line placement. Probable mild pulmonary edema, question minimally increased Electronically Signed   By: Ulyses Southward M.D.   On: 11/16/2017 17:09   Dg Abd Portable 1v  Result Date: 11/16/2017 CLINICAL DATA:  OG tube placement EXAM: PORTABLE ABDOMEN - 1 VIEW COMPARISON:  CT 10/16/2017 FINDINGS: Esophageal tube tip is coiled in the region of the distal stomach. Surgical clips in the right upper quadrant. Gas-filled loops of bowel in the central abdomen. Treated compression of the spine. IMPRESSION: Esophageal tube tip is coiled within the distal stomach. Electronically Signed   By: Jasmine Pang M.D.   On: 11/16/2017 18:18   Dg Abd Portable 1v  Result Date: 11/16/2017 CLINICAL  DATA:  Abdominal distension EXAM: PORTABLE ABDOMEN - 1 VIEW COMPARISON:  Portable exam 1628 hours compared to CT abdomen and pelvis 10/16/2017 FINDINGS: Gaseous distention of stomach. Air-filled normal sized small bowel loops in mid abdomen. Paucity of colonic gas. No definite bowel wall thickening or obstruction. Bones demineralized with old appearing compression deformities at T12 and L1 and prior spinal augmentation procedure at L2. IMPRESSION: Gaseous distention of stomach. Electronically Signed   By: Ulyses Southward M.D.   On: 11/16/2017 17:08       DISCUSSION: 57 y/o male with worsening AKI and encephalopathy in the setting of baseline cirrhosis.  Likely aspirated based on exam 3/25.  ASSESSMENT / PLAN:  PULMONARY A: Aspiration Acute respiratory failure due to inability to protect airway   11/18/2017 - > doing SBT but does not meet criteria for Extubation in setting of Acute Respiratory Failure due to hepatic encephalopathy   P:   Full mechanical vent support VAP prevention Daily WUA/SBT Intubate  CARDIOVASCULAR A:  CAD  - on precedex and dopamine with HR 57. Very low dose dopamine  P:  Tele Dc dopamine Monitor HR If HR/BP issues - > will add levophed without dc of precedex to extent possible  RENAL A:   Worsening AKI, ATN vs hepatorenal   - 11/18/2017 - anuric. Low phos being repleted by renal P:   Continue CVVHD  GASTROINTESTINAL A:   Alcoholic Cirrhosis  11/18/2017 0 seen by Deboraha Sprang GI 11/16/17  P:   - xifaxan + lactulose + octreotide + midodrine + octreotide - continue TF  HEMATOLOGIC A:   Anemia no bleeding Acute on chronic thrombocytopenia > spurious?   - 11/18/2017 -> very low platelets   P:  Monitor for bleeding Repeat CBC Platelet Tx if bleeding or < / = 10K  INFECTIOUS A:   Aspiration? SBP? P:   Send resp culture Monitor for fever  ENDOCRINE A:   No acute  issues  P:   Monitor glucose  NEUROLOGIC A:   Hepatic encephalopathy vs  encephalopathy from renal diseae - improving  P:   RASS goal 0 Continue intermittent fentanyl per PAD protocol Rifaximin Lactulsoe  FAMILY  - Updates: Updated wife on 3/25, she is amenable to short term aggressive measures but understands his poor prognosis.   - Inter-disciplinary family meet or Palliative Care meeting due by:  day 7 d- w/ Wife on phone with RN and pahrmacy present. Full code for now. Poor prognosis explained     The patient is critically ill with multiple organ systems failure and requires high complexity decision making for assessment and support, frequent evaluation and titration of therapies, application of advanced monitoring technologies and extensive interpretation of multiple databases.   Critical Care Time devoted to patient care services described in this note is  30  Minutes. This time reflects time of care of this signee Dr Kalman Shan. This critical care time does not reflect procedure time, or teaching time or supervisory time of PA/NP/Med student/Med Resident etc but could involve care discussion time    Dr. Kalman Shan, M.D., [facility].C.P Pulmonary and Critical Care Medicine Staff Physician [facility]  Pulmonary and Critical Care Pager: [phone], If no answer or between  15:00h - 7:00h: call [phone]  11/18/2017 9:31 AM

## 2017-11-18 NOTE — Progress Notes (Addendum)
OT Cancellation Note/ Discharge  Patient Details Name: [name] MRN: [id number] DOB: 10/28/1960   Cancelled Treatment:    Reason Eval/Treat Not Completed: (Pt with medical decline, now intubated and on CRRT.) Will sign off and await new order.  [name], [name] 11/18/2017, 10:50 AM

## 2017-11-18 NOTE — Progress Notes (Signed)
Physical Therapy Discharge Patient Details Name: [name] MRN: [id number] DOB: 08/08/1961 Today's Date: 11/18/2017 Time:  -     Patient discharged from PT services secondary to medical decline - will need to re-order PT to resume therapy services.  Please see latest therapy progress note for current level of functioning and progress toward goals.    Progress and discharge plan discussed with patient and/or caregiver: Patient unable to participate in discharge planning and no caregivers available  GP     Berline LopesDawn F Klaus Casteneda 11/18/2017, 10:44 AM  Eber Jonesawn Elesia Pemberton,PT Acute Rehabilitation [phone] (pager)

## 2017-11-18 NOTE — Progress Notes (Signed)
SLP Cancellation Note  Patient Details Name: [name] MRN: [id number] DOB: 10/23/1960   Cancelled treatment:       Reason Eval/Treat Not Completed: Medical issues which prohibited therapy - pt remains intubated. Will check in intermittently for readiness to participate in dysphagia tx.   [name], [name] 11/18/2017, 9:39 AM  [name], M.A. CCC-SLP [phone]

## 2017-11-18 NOTE — Progress Notes (Signed)
[facility] Progress Note   57 year old man with a history of diabetes Parkinson compression fractures and alcohol abuse. He continues to drink. He presented to the ED with 2 weeks of increasing edema abdominal swelling and lethargy. Hypoglycemic due to ongoing insulin use. Intubated for airway protection. Hypotension. Treated empirically for presumed SBP although quantity of ascites was notsufficient to tap. Started on lactulose   Assessment/ Plan:    Acute kidney injury  Thought to be ATN although certainly would fit with hepatorenal syndrome - we are trying midodrine octreotide and albumin   no signs of recovery but tolerating CVVHD  Will try net UF of 58m/hr starting 3/27  He has acute alcoholic cirrhosis  Started on CRRT 3/26 (D2)   Anemia   --  Hb 9.3  Follow with critical care   Check iron stores  Cirrhosis  -   INR 1.45   Lactulose     Encephalopathy   Waxes and wanes   Acid, base and electrolytes reviewed  Very challenging situation; poor prognosis. Stable on CRRT  While it's reasonable to continue CRRT there needs to be an endpoint; ie if no signs of improvement in 2 weeks to withdraw CRRT.    Subjective:   Creatinine continues to rise with minimal UOP; pretty much oliguric to anuric  This is a difficult situation and not sure that dialysis would be indicated but we are giving a trial on CRRT.  The initial finding would suggest ATN   We have placed on midodrine and octreotide and albumin     Objective:   BP 138/72   Pulse (!) 51   Temp 97.7 F (36.5 C)   Resp (!) 24   Ht '5\' 8"'$  (1.727 m)   Wt 85.6 kg (188 lb 11.4 oz)   SpO2 100%   BMI 28.69 kg/m   Intake/Output Summary (Last 24 hours) at 11/18/2017 0739 Last data filed at 11/18/2017 0700 Gross per 24 hour  Intake 2497.56 ml  Output 3047 ml  Net -549.44 ml   Weight change: -5.2 kg (-11 lb 7.4 oz)  Physical Exam: Gen: Intubated w/ tube feeds CVS- RRR  Icteric  RS-  CTA ABD- BS present, distended EXT- 1 +  edema    Imaging: [place]domen Limited  Result Date: 11/17/2017 CLINICAL DATA:  Hepatic cirrhosis EXAM: ULTRASOUND ABDOMEN LIMITED RIGHT UPPER QUADRANT COMPARISON:  Abdominal and pelvic CT scan of November 13, 2017 FINDINGS: Gallbladder: The gallbladder is surgically absent. Common bile duct: Diameter: 5.3 mm Liver: The hepatic echotexture is mildly increased and is heterogeneous. The surface contour of the liver is fairly smooth. There is a small amount of ascites. No focal mass or ductal dilation is observed. Portal vein is patent on color Doppler imaging with normal direction of blood flow towards the liver. IMPRESSION: Ascites. Increased hepatic echotexture likely reflects cirrhosis. No suspicious hepatic masses. Previous cholecystectomy.  Normal caliber common bile duct. Electronically Signed   By: David  JMartiniqueM.D.   On: 11/17/2017 11:13   Portable Chest X-ray  Result Date: 11/16/2017 CLINICAL DATA:  ETT placement EXAM: PORTABLE CHEST 1 VIEW COMPARISON:  11/16/2017, 11/11/2017, 11/08/2017, CT chest 10/16/2017 FINDINGS: Endotracheal tube tip is at or just above the carina. Right-sided central venous catheter tip overlies the SVC. Esophageal tube tip is below the diaphragm but not included. Stable enlarged cardiomediastinal silhouette. Similar foci of ground-glass opacity in the upper lobes with streaky atelectasis at the left base. Old rib fractures. IMPRESSION: 1. Endotracheal tube tip at  or just above the carina 2. Stable enlarged cardiomediastinal silhouette with persistent left basilar atelectasis. Mild diffuse interstitial opacity suggesting minimal edema; focal ground-glass opacities in the upper lobes may reflect superimposed foci of infection or inflammation. Electronically Signed   By: Donavan Foil M.D.   On: 11/16/2017 18:17   Dg Chest Port 1 View  Result Date: 11/16/2017 CLINICAL DATA:  Central line placement EXAM: PORTABLE CHEST 1 VIEW  COMPARISON:  Portable exam 1630 hours compared to 11/14/2017 FINDINGS: RIGHT jugular central venous catheter with tip projecting over SVC. Enlargement of cardiac silhouette with pulmonary vascular congestion. Mild chronic interstitial infiltrates likely pulmonary edema, see question slightly increased in LEFT upper lobe. No pleural effusion or pneumothorax. Bones demineralized with malleable plates again identified at the sternum. Old LEFT rib fractures. IMPRESSION: No pneumothorax following RIGHT jugular line placement. Probable mild pulmonary edema, question minimally increased Electronically Signed   By: Lavonia Dana M.D.   On: 11/16/2017 17:09   Dg Abd Portable 1v  Result Date: 11/16/2017 CLINICAL DATA:  OG tube placement EXAM: PORTABLE ABDOMEN - 1 VIEW COMPARISON:  CT 10/16/2017 FINDINGS: Esophageal tube tip is coiled in the region of the distal stomach. Surgical clips in the right upper quadrant. Gas-filled loops of bowel in the central abdomen. Treated compression of the spine. IMPRESSION: Esophageal tube tip is coiled within the distal stomach. Electronically Signed   By: Donavan Foil M.D.   On: 11/16/2017 18:18   Dg Abd Portable 1v  Result Date: 11/16/2017 CLINICAL DATA:  Abdominal distension EXAM: PORTABLE ABDOMEN - 1 VIEW COMPARISON:  Portable exam 1628 hours compared to CT abdomen and pelvis 10/16/2017 FINDINGS: Gaseous distention of stomach. Air-filled normal sized small bowel loops in mid abdomen. Paucity of colonic gas. No definite bowel wall thickening or obstruction. Bones demineralized with old appearing compression deformities at T12 and L1 and prior spinal augmentation procedure at L2. IMPRESSION: Gaseous distention of stomach. Electronically Signed   By: Lavonia Dana M.D.   On: 11/16/2017 17:08    Labs: BMET Recent Labs  Lab 11/14/17 0625 11/15/17 0336 11/16/17 0401 11/17/17 0212 11/17/17 1014 11/17/17 1545 11/18/17 0341  NA 136 139 138 140 141 138 139  K 4.3 4.1 4.7 4.1  4.1 3.9 4.1  CL 99* 105 103 104 105 102 102  CO2 20* 21* 18* '22 24 23 25  '$ GLUCOSE 121* 109* 144* 109* 138* 171* 198*  BUN 41* 50* 53* 36* 24* 21* 13  CREATININE 6.82* 7.74* 8.75* 5.90* 4.69* 3.75* 2.75*  CALCIUM 8.8* 9.0 9.1 8.9 8.6* 8.5* 8.6*  PHOS  --   --   --   --  3.3 2.6  2.5 1.9*   CBC Recent Labs  Lab 11/14/17 0625 11/15/17 0336 11/16/17 0401 11/17/17 0212 11/17/17 1115 11/18/17 0341  WBC 20.0* 17.9* 20.2* 18.7*  --  22.3*  NEUTROABS 15.2* 13.9* 16.4*  --   --  18.3*  HGB 10.2* 9.3* 9.4* 9.6*  --  9.8*  HCT 32.0* 29.1* 30.5* 30.5*  --  32.3*  MCV 114.3* 117.3* 117.3* 118.2*  --  118.3*  PLT 110* 101* 60* 16* 15* 16*    Medications:    . chlorhexidine gluconate (MEDLINE KIT)  15 mL Mouth Rinse BID  . Chlorhexidine Gluconate Cloth  6 each Topical Daily  . feeding supplement (PRO-STAT SUGAR FREE 64)  30 mL Per Tube BID  . folic acid  1 mg Per Tube Daily  . insulin aspart  0-15 Units Subcutaneous Q4H  .  ipratropium-albuterol  3 mL Nebulization TID  . lactulose  20 g Per Tube TID  . mouth rinse  15 mL Mouth Rinse 10 times per day  . midodrine  10 mg Per Tube TID WC  . octreotide  200 mcg Subcutaneous BID  . QUEtiapine  25 mg Per Tube QHS  . rifaximin  550 mg Per Tube BID  . sodium chloride flush  10-40 mL Intracatheter Q12H  . thiamine  100 mg Per Tube Daily      Otelia Santee, MD 11/18/2017, 7:39 AM

## 2017-11-18 NOTE — Progress Notes (Signed)
Pt's HR 38-42 and Dr. Marchelle Gearingamaswamy made aware. No new orders at this time.

## 2017-11-19 LAB — RENAL FUNCTION PANEL
ANION GAP: 11 (ref 5–15)
Albumin: 2.8 g/dL — ABNORMAL LOW (ref 3.5–5.0)
BUN: 8 mg/dL (ref 6–20)
CALCIUM: 8.3 mg/dL — AB (ref 8.9–10.3)
CHLORIDE: 103 mmol/L (ref 101–111)
CO2: 25 mmol/L (ref 22–32)
Creatinine, Ser: 1.59 mg/dL — ABNORMAL HIGH (ref 0.61–1.24)
GFR, EST AFRICAN AMERICAN: 55 mL/min — AB (ref >60)
GFR, EST NON AFRICAN AMERICAN: 47 mL/min — AB (ref >60)
Glucose, Bld: 123 mg/dL — ABNORMAL HIGH (ref 65–99)
Phosphorus: 2.3 mg/dL — ABNORMAL LOW (ref 2.5–4.6)
Potassium: 4 mmol/L (ref 3.5–5.1)
SODIUM: 139 mmol/L (ref 135–145)

## 2017-11-19 LAB — GLUCOSE, CAPILLARY
GLUCOSE-CAPILLARY: 78 mg/dL (ref 65–99)
GLUCOSE-CAPILLARY: 113 mg/dL — AB (ref 65–99)
Glucose-Capillary: 129 mg/dL — ABNORMAL HIGH (ref 65–99)
Glucose-Capillary: 169 mg/dL — ABNORMAL HIGH (ref 65–99)
Glucose-Capillary: 196 mg/dL — ABNORMAL HIGH (ref 65–99)
Glucose-Capillary: 93 mg/dL (ref 65–99)

## 2017-11-19 LAB — COMPREHENSIVE METABOLIC PANEL
ALBUMIN: 3.2 g/dL — AB (ref 3.5–5.0)
ALK PHOS: 158 U/L — AB (ref 38–126)
ALT: 33 U/L (ref 17–63)
AST: 77 U/L — AB (ref 15–41)
Anion gap: 10 (ref 5–15)
BILIRUBIN TOTAL: 11.9 mg/dL — AB (ref 0.3–1.2)
BUN: 9 mg/dL (ref 6–20)
CALCIUM: 8.5 mg/dL — AB (ref 8.9–10.3)
CO2: 25 mmol/L (ref 22–32)
Chloride: 104 mmol/L (ref 101–111)
Creatinine, Ser: 1.73 mg/dL — ABNORMAL HIGH (ref 0.61–1.24)
GFR calc Af Amer: 49 mL/min — ABNORMAL LOW (ref >60)
GFR calc non Af Amer: 43 mL/min — ABNORMAL LOW (ref >60)
GLUCOSE: 248 mg/dL — AB (ref 65–99)
Potassium: 4.2 mmol/L (ref 3.5–5.1)
Sodium: 139 mmol/L (ref 135–145)
TOTAL PROTEIN: 6.7 g/dL (ref 6.5–8.1)

## 2017-11-19 LAB — CBC
HEMATOCRIT: 32 % — AB (ref 39.0–52.0)
HEMOGLOBIN: 9.6 g/dL — AB (ref 13.0–17.0)
MCH: 35.7 pg — ABNORMAL HIGH (ref 26.0–34.0)
MCHC: 30 g/dL (ref 30.0–36.0)
MCV: 119 fL — ABNORMAL HIGH (ref 78.0–100.0)
Platelets: 13 10*3/uL — CL (ref 150–400)
RBC: 2.69 MIL/uL — AB (ref 4.22–5.81)
RDW: 18.4 % — ABNORMAL HIGH (ref 11.5–15.5)
WBC: 20.7 10*3/uL — AB (ref 4.0–10.5)

## 2017-11-19 LAB — MAGNESIUM: Magnesium: 2.4 mg/dL (ref 1.7–2.4)

## 2017-11-19 LAB — PROCALCITONIN: Procalcitonin: 4.06 ng/mL

## 2017-11-19 LAB — PHOSPHORUS: Phosphorus: 1.8 mg/dL — ABNORMAL LOW (ref 2.5–4.6)

## 2017-11-19 LAB — AMMONIA: AMMONIA: 44 umol/L — AB (ref 9–35)

## 2017-11-19 LAB — PROTIME-INR
INR: 1.48
Prothrombin Time: 17.8 seconds — ABNORMAL HIGH (ref 11.4–15.2)

## 2017-11-19 LAB — TYPE AND SCREEN
ABO/RH(D): O POS
ANTIBODY SCREEN: NEGATIVE

## 2017-11-19 MED ORDER — LACTULOSE ENEMA
300.0000 mL | Freq: Three times a day (TID) | ORAL | Status: DC
  Administered 2017-11-19 – 2017-11-21 (×2): 300 mL via RECTAL
  Filled 2017-11-19 (×62): qty 300

## 2017-11-19 MED ORDER — POTASSIUM PHOSPHATES 15 MMOLE/5ML IV SOLN
20.0000 mmol | Freq: Once | INTRAVENOUS | Status: AC
  Administered 2017-11-19: 20 mmol via INTRAVENOUS
  Filled 2017-11-19: qty 6.67

## 2017-11-19 MED ORDER — LACTULOSE 10 GM/15ML PO SOLN
20.0000 g | Freq: Three times a day (TID) | ORAL | Status: DC
  Administered 2017-11-19 – 2017-12-09 (×45): 20 g
  Filled 2017-11-19 (×54): qty 30

## 2017-11-19 MED ORDER — CHLORHEXIDINE GLUCONATE 0.12 % MT SOLN
15.0000 mL | Freq: Two times a day (BID) | OROMUCOSAL | Status: DC
  Administered 2017-11-19 – 2017-12-09 (×29): 15 mL via OROMUCOSAL
  Filled 2017-11-19 (×28): qty 15

## 2017-11-19 MED ORDER — ORAL CARE MOUTH RINSE
15.0000 mL | Freq: Two times a day (BID) | OROMUCOSAL | Status: DC
  Administered 2017-11-19 – 2017-12-13 (×23): 15 mL via OROMUCOSAL

## 2017-11-19 NOTE — Progress Notes (Signed)
[facility] Progress Note   57 year old man with a history of diabetes Parkinson compression fractures and alcohol abuse. He continues to drink.He presented to the ED with 2 weeks of increasing edema abdominal swelling and lethargy. Hypoglycemic due to ongoing insulin use. Intubated for airway protection. Hypotension. Treated empirically for presumed SBP although quantity of ascites was notsufficient to tap. Started on lactulose    Assessment/ Plan:    Acute kidney injury Thought to be ATN although certainly would fit with hepatorenal syndrome - trial of midodrine octreotide and albumin  ? no signs of recovery but tolerating CVVHD ? Will try net UF of 45m/hr starting 3/27 ? Increase to 1019mhr net (he's tolerating)  He has acute alcoholic cirrhosis  Startedon CRRT 3/26 (D3)   Anemia -- Hb 9.3 Follow with critical care Check iron stores  Cirrhosis - INR 1.45 Lactulose   Encephalopathy Waxes and wanes   Acid, base and electrolytes reviewed  Very challenging situation; poor prognosis. Stable on CRRT While it's reasonable to continue CRRT there needs to be an endpoint; ie if no signs of improvement in 2 weeks to withdraw CRRT.  Seen on CRRT: 4K bath qb 200 RIJ temp cath 200/300/2000 pre/post/dialysate flow UF net currently 5039mr and not on pressors    Subjective:   Creatinine decreasing bec of CVVHD but basically anuric  This is a difficult situation and not sure that dialysis would be indicated but we are giving a trial on CRRT. The initial finding would suggest ATN / HRS.We have placed on midodrine and octreotide and albumin   Objective:   BP 114/73   Pulse (!) 41   Temp 97.6 F (36.4 C) (Oral)   Resp 19   Ht '5\' 8"'$  (1.727 m)   Wt 84 kg (185 lb 3 oz)   SpO2 100%   BMI 28.16 kg/m   Intake/Output Summary (Last 24 hours) at 11/19/2017 [id number] data filed at 11/19/2017 0700 Gross per 24 hour  Intake 3019.56 ml   Output 6088 ml  Net -3068.44 ml   Weight change: -1.6 kg (-3 lb 8.4 oz)  Physical Exam: Gen: Intubated w/ tube feeds CVS- RRR Icteric  RS- CTA ABD- BS present, distended EXT- 1 + edema GU: Foley    Imaging: Us [place] Limited  Result Date: 11/17/2017 CLINICAL DATA:  Hepatic cirrhosis EXAM: ULTRASOUND ABDOMEN LIMITED RIGHT UPPER QUADRANT COMPARISON:  Abdominal and pelvic CT scan of November 13, 2017 FINDINGS: Gallbladder: The gallbladder is surgically absent. Common bile duct: Diameter: 5.3 mm Liver: The hepatic echotexture is mildly increased and is heterogeneous. The surface contour of the liver is fairly smooth. There is a small amount of ascites. No focal mass or ductal dilation is observed. Portal vein is patent on color Doppler imaging with normal direction of blood flow towards the liver. IMPRESSION: Ascites. Increased hepatic echotexture likely reflects cirrhosis. No suspicious hepatic masses. Previous cholecystectomy.  Normal caliber common bile duct. Electronically Signed   By: David  JorMartiniqueD.   On: 11/17/2017 11:13    Labs: BMET Recent Labs  Lab 11/16/17 0401 11/17/17 0212 11/17/17 1014 11/17/17 1545 11/18/17 0341 11/18/17 1602 11/19/17 0410  NA 138 140 141 138 139 139 139  K 4.7 4.1 4.1 3.9 4.1 4.1 4.2  CL 103 104 105 102 102 104 104  CO2 18* '22 24 23 25 23 25  '$ GLUCOSE 144* 109* 138* 171* 198* 209* 248*  BUN 53* 36* 24* 21* '13 11 9  '$ CREATININE 8.75* 5.90* 4.69* 3.75*  2.75* 2.11* 1.73*  CALCIUM 9.1 8.9 8.6* 8.5* 8.6* 8.4* 8.5*  PHOS  --   --  3.3 2.6  2.5 1.9* 2.3*  2.3* 1.8*   CBC Recent Labs  Lab 11/14/17 0625 11/15/17 0336 11/16/17 0401 11/17/17 0212 11/17/17 1115 11/18/17 0341 11/19/17 0410  WBC 20.0* 17.9* 20.2* 18.7*  --  22.3* 20.7*  NEUTROABS 15.2* 13.9* 16.4*  --   --  18.3*  --   HGB 10.2* 9.3* 9.4* 9.6*  --  9.8* 9.6*  HCT 32.0* 29.1* 30.5* 30.5*  --  32.3* 32.0*  MCV 114.3* 117.3* 117.3* 118.2*  --  118.3* 119.0*  PLT 110* 101* 60*  16* 15* 16* 13*    Medications:    . chlorhexidine gluconate (MEDLINE KIT)  15 mL Mouth Rinse BID  . Chlorhexidine Gluconate Cloth  6 each Topical Daily  . feeding supplement (PRO-STAT SUGAR FREE 64)  30 mL Per Tube BID  . folic acid  1 mg Per Tube Daily  . insulin aspart  0-15 Units Subcutaneous Q4H  . ipratropium-albuterol  3 mL Nebulization TID  . lactulose  20 g Per Tube TID  . mouth rinse  15 mL Mouth Rinse 10 times per day  . midodrine  10 mg Per Tube TID WC  . octreotide  200 mcg Subcutaneous BID  . QUEtiapine  25 mg Per Tube QHS  . rifaximin  550 mg Per Tube BID  . sodium chloride flush  10-40 mL Intracatheter Q12H  . thiamine  100 mg Per Tube Daily      Otelia Santee, MD 11/19/2017, 7:14 AM

## 2017-11-19 NOTE — Progress Notes (Signed)
SLP has been following patient for PO readiness - note that he was extubated late morning after being intubated for several days. RN reports that pt's voice is hoarse post-extubation which increases the likelihood of post-extubation dysphagia.   Discussed with RN - given concern for high aspiration risk prior to repeat intubation despite highly restricted diet, SLP highly recommends instrumental swallow study (FEES vs MBS) prior to initiation of POs.  SLP will f/u on next date to determine readiness to participate in testing. Would hold POs until testing is completed.   [name], M.A. CCC-SLP [phone]

## 2017-11-19 NOTE — Progress Notes (Signed)
PULMONARY / CRITICAL CARE MEDICINE   Name: [name] MRN: [id number] DOB: 06/17/1961    ADMISSION DATE:  11/07/2017 CONSULTATION DATE:  11/07/2017 LOS 12 days  REFERRING MD:  EDP  CHIEF COMPLAINT:  Abdominal swelling, encephalopath  57 yo male with a history of cirrhosis and etoh use, cad, and DM2 was admitted with decompensated hepatic encephalopathy.  After admission he developed worsening renal failure.  PCCM called back to see him 3/25 in the setting of worsening encephalopathy and AKI.      CULTURES: 11/07/2017 blood cultures x2>> 11/07/2017 urine culture>> 11/07/2017 paracentesis >> not done, no significant fluid  ANTIBIOTICS: Ceftriaxone 3/16 >> 3.22 vancomycin 3/17 >> 3/18 Zosyn 3/24 >>    LINES/TUBES: ETT 3/17 >>  3/20, 3/25 >  3/25 - CRRT  11/18/2017  -  MELD score 34 points -> 52% 3 mortality. Per RN / pharm got reintubated after ativan for HD cath and got intubated on 11/16/17. Per care team - full code as of 11/16/17. On dopamine 2mcg for bradycardia started 2 night ago. On precdexe. Current HR 57 and MAP 98  . Patient is anuric. Platelet 16    SUBJECTIVE/OVERNIGHT/INTERVAL HX 11/19/2017 = >  MELD 33 points, CHILD C - 11 points. Platelet dropped to 13. No obvious bleeding. On CRRT. Anuric/oliguric. -> Per renal recommends CRRT only as time ltd trial of max 2 weeeks. Currently -  Folloows command and occ agitated. Did well on SBT all day yesterday. Doing SBT again. On pecedex. ET tube partially out.   OG tube out -> Patient not able to to get lactulose + xifxan; Cortrak team not here. 11/19/2017  Still getting + midodrine + octreotide     VITAL SIGNS: BP 92/68   Pulse (!) 45   Temp 97.6 F (36.4 C) (Oral)   Resp (!) 26   Ht 5\' 8"  (1.727 m)   Wt 84 kg (185 lb 3 oz)   SpO2 97%   BMI 28.16 kg/m   HEMODYNAMICS:    VENTILATOR SETTINGS: Vent Mode: CPAP;PSV FiO2 (%):  [40 %] 40 % Set Rate:  [24 bmp] 24 bmp Vt Set:  [550 mL] 550 mL PEEP:  [5  cmH20] 5 cmH20 Pressure Support:  [15 cmH20] 15 cmH20 Plateau Pressure:  [26 cmH20-36 cmH20] 36 cmH20  INTAKE / OUTPUT: I/O last 3 completed shifts: In: 4226.9 [I.V.:817.4; Other:120; NG/GT:2583.9; IV Piggyback:705.5] Out: 5621 [HYQMV:[id number] [Other:5479; Stool:2150]  PHYSICAL EXAMINATION:  General Appearance:    Looks criticall ill. Jaundiced +  Head:    Normocephalic, without obvious abnormality, atraumatic  Eyes:    PERRL - yes, conjunctiva/corneas - clear      Ears:    Normal external ear canals, both ears  Nose:   NG tube - removed  Throat:  ETT TUBE - uyes , OG tube - no  Neck:   Supple,  No enlargement/tenderness/nodules     Lungs:     Clear to auscultation bilaterally, Ventilator   Synchrony - yes  Chest wall:    No deformity  Heart:    S1 and S2 normal, no murmur, CVP - no.  Pressors - no  Abdomen:     Soft, no masses, no organomegaly. ASCITES +  Genitalia:    Not done  Rectal:   not done  Extremities:   Extremities- edema     Skin:   Intact in exposed areas .     Neurologic:   Sedation - none -> RASS - +1 . Moves all 4s -  yes. CAM-ICU - na . Orientation - able to follow commands. Seems oriented. REcognizes wife         PULMONARY Recent Labs  Lab 11/14/17 0917 11/16/17 1839  PHART 7.351 7.232*  PCO2ART 35.3 46.9  PO2ART 46.0* 78.0*  HCO3 19.7* 19.9*  TCO2 21* 21*  O2SAT 81.0 94.0    CBC Recent Labs  Lab 11/17/17 0212 11/17/17 1115 11/18/17 0341 11/19/17 0410  HGB 9.6*  --  9.8* 9.6*  HCT 30.5*  --  32.3* 32.0*  WBC 18.7*  --  22.3* 20.7*  PLT 16* 15* 16* 13*    COAGULATION Recent Labs  Lab 11/15/17 0336 11/16/17 0401 11/17/17 0212 11/18/17 0341 11/19/17 0410  INR 1.45 1.48 1.45 1.54 1.48    CARDIAC  No results for input(s): TROPONINI in the last 168 hours. No results for input(s): PROBNP in the last 168 hours.   CHEMISTRY Recent Labs  Lab 11/17/17 1014 11/17/17 1545 11/18/17 0341 11/18/17 1602 11/19/17 0410  NA 141 138 139 139 139  K  4.1 3.9 4.1 4.1 4.2  CL 105 102 102 104 104  CO2 24 23 25 23 25   GLUCOSE 138* 171* 198* 209* 248*  BUN 24* 21* 13 11 9   CREATININE 4.69* 3.75* 2.75* 2.11* 1.73*  CALCIUM 8.6* 8.5* 8.6* 8.4* 8.5*  MG 2.6* 2.6* 2.6* 2.5* 2.4  PHOS 3.3 2.6  2.5 1.9* 2.3*  2.3* 1.8*   Estimated Creatinine Clearance: 50.9 mL/min (A) (by C-G formula based on SCr of 1.73 mg/dL (H)).   LIVER Recent Labs  Lab 11/15/17 0336 11/16/17 0401 11/17/17 0212 11/17/17 1014 11/17/17 1545 11/18/17 0341 11/18/17 1602 11/19/17 0410  AST 94* 95* 91*  --   --  80*  --  77*  ALT 38 32 40  --   --  36  --  33  ALKPHOS 207* 197* 192*  --   --  170*  --  158*  BILITOT 13.1* 12.3* 12.1*  --   --  12.3*  --  11.9*  PROT 5.9* 6.1* 6.8  --   --  6.5  --  6.7  ALBUMIN 2.5* 2.9* 3.4* 3.4* 3.2* 3.3* 3.3* 3.2*  INR 1.45 1.48 1.45  --   --  1.54  --  1.48     INFECTIOUS No results for input(s): LATICACIDVEN, PROCALCITON in the last 168 hours.   ENDOCRINE CBG (last 3)  Recent Labs    11/18/17 2313 11/19/17 0354 11/19/17 0726  GLUCAP 200* 196* 169*         IMAGING x48h  - image(s) personally visualized  -   highlighted in bold US Abdomen Limited  Result Date: 11/17/2017 CLINICAL DATA:  Hepatic cirrhosis EXAM: ULTRASOUND ABDOMEN LIMITED RIGHT UPPER QUADRANT COMPARISON:  Abdominal and pelvic CT scan of November 13, 2017 FINDINGS: Gallbladder: The gallbladder is surgically absent. Common bile duct: Diameter: 5.3 mm Liver: The hepatic echotexture is mildly increased and is heterogeneous. The surface contour of the liver is fairly smooth. There is a small amount of ascites. No focal mass or ductal dilation is observed. Portal vein is patent on color Doppler imaging with normal direction of blood flow towards the liver. IMPRESSION: Ascites. Increased hepatic echotexture likely reflects cirrhosis. No suspicious hepatic masses. Previous cholecystectomy.  Normal caliber common bile duct. Electronically Signed   By: David   [place] M.D.   On: 11/17/2017 11:13       DISCUSSION: 57 y/o male with worsening AKI and encephalopathy in the setting of  baseline cirrhosis.  Likely aspirated based on exam 3/25.  ASSESSMENT / PLAN:  PULMONARY A: Aspiration Acute respiratory failure due to inability to protect airway   11/19/2017 - > doing SBT and technically meets extubation criteria but liver/renal failure indicate risk for reintuation  P:   extuvate - ok to reintubae if fails VAP prvention  CARDIOVASCULAR A:  CAD  - on precedex . Off dopamine. HR 45. Maintains BP/HR P:  If HR/BP issues - > will add levophed without dc of precedex to extent possible continu midodrine  RENAL A:   Worsening AKI, ATN vs hepatorenal   - 11/19/2017 - anuric. Stil on CRRT. Renal reocmmends time limited trial only  P:   Continue CVVHD  GASTROINTESTINAL A:   Alcoholic Cirrhosis  11/19/2017  last seen by [facility] 11/16/17. Lost OG/NG. Unable to get cortrak due to team unavailability  P:   - xifaxan + lactulose -> do enema with lactulose -> place cortrak 11/20/17 under platelt  Cover [place]  + octreotide + midodrine  - NPO  HEMATOLOGIC A:   Anemia no bleeding Acute on chronic thrombocytopenia > spurious?   - 11/19/2017 -> very low platelets   P:  Monitor for bleeding Repeat CBC Platelet Tx if bleeding or < / = 10K or for procdure (cortrak next)  INFECTIOUS A:   Aspiration? SBP? - no clear evidencd of infectino  P:   Dc zosyn esp with low platelets  ENDOCRINE A:   No acute issues  P:   Monitor glucose  NEUROLOGIC A:   Hepatic encephalopathy vs encephalopathy from renal diseae - seems nearly resolved   P:   RASS goal 0 Continue intermittent fentanyl per PAD protocol    FAMILY  - Updates: Updated wife on 3/25, she is amenable to short term aggressive measures but understands his poor prognosis.   - Inter-disciplinary family meet or Palliative Care meeting due by:  day 7 d-  o done again  with pharmacy staff and RN present -> wife agrees to NO CPR but yes for reintubation and full medical care       The patient is critically ill with multiple organ systems failure and requires high complexity decision making for assessment and support, frequent evaluation and titration of therapies, application of advanced monitoring technologies and extensive interpretation of multiple databases.   Critical Care Time devoted to patient care services described in this note is  30  Minutes. This time reflects time of care of this signee Dr Kalman Shan. This critical care time does not reflect procedure time, or teaching time or supervisory time of PA/NP/Med student/Med Resident etc but could involve care discussion time    Dr. Kalman Shan, M.D., [facility].C.P Pulmonary and Critical Care Medicine Staff Physician [place]  Pulmonary and Critical Care Pager: [phone], If no answer or between  15:00h - 7:00h: call [phone]  11/19/2017 10:26 AM

## 2017-11-19 NOTE — Procedures (Signed)
Extubation Procedure Note  Patient Details:   Name: [name] DOB: 03/30/1961 MRN: [id number]   Airway Documentation:     Evaluation  O2 sats: stable throughout Complications: No apparent complications Patient did tolerate procedure well. Bilateral Breath Sounds: Clear, Diminished   Yes pt able to vocalize.   Pt extubated per MD order. Pt was able to breathe around deflated cuff. No stridor noted. Pt able to vocalize. Pt placed on 4L [place] and tolerating well. IS performed 105900mL-1250mLx5. Pt has strong, adequate cough.   Loyal Jacobsonhompson, Sonyia Muro [facility] 11/19/2017, 10:27 AM

## 2017-11-19 NOTE — Progress Notes (Signed)
Pt BP 80's/50's while removing 100 ml on CRRT, notified Nephrology. Advised to decrease removal rate to 50 and to keep even if pt BP continues to drop. Will continue to monitor.

## 2017-11-19 NOTE — Progress Notes (Signed)
Pt removed OG tube. Notified Elink, advised not to readvance OG tube. Pt VSS, nursing will continue to monitor.

## 2017-11-20 DIAGNOSIS — Z7189 Other specified counseling: Secondary | ICD-10-CM

## 2017-11-20 DIAGNOSIS — Z515 Encounter for palliative care: Secondary | ICD-10-CM

## 2017-11-20 LAB — GLUCOSE, CAPILLARY
GLUCOSE-CAPILLARY: 109 mg/dL — AB (ref 65–99)
GLUCOSE-CAPILLARY: 138 mg/dL — AB (ref 65–99)
Glucose-Capillary: 121 mg/dL — ABNORMAL HIGH (ref 65–99)
Glucose-Capillary: 122 mg/dL — ABNORMAL HIGH (ref 65–99)
Glucose-Capillary: 130 mg/dL — ABNORMAL HIGH (ref 65–99)
Glucose-Capillary: 104 mg/dL — ABNORMAL HIGH (ref 65–99)

## 2017-11-20 LAB — CULTURE, BLOOD (ROUTINE X 2)
Culture: NO GROWTH
Culture: NO GROWTH
Special Requests: ADEQUATE
Special Requests: ADEQUATE

## 2017-11-20 LAB — CBC
HEMATOCRIT: 30.3 % — AB (ref 39.0–52.0)
HEMOGLOBIN: 9.4 g/dL — AB (ref 13.0–17.0)
MCH: 37.5 pg — AB (ref 26.0–34.0)
MCHC: 31 g/dL (ref 30.0–36.0)
MCV: 120.7 fL — AB (ref 78.0–100.0)
Platelets: 20 10*3/uL — CL (ref 150–400)
RBC: 2.51 MIL/uL — AB (ref 4.22–5.81)
RDW: 18.2 % — ABNORMAL HIGH (ref 11.5–15.5)
WBC: 20.7 10*3/uL — ABNORMAL HIGH (ref 4.0–10.5)

## 2017-11-20 LAB — RENAL FUNCTION PANEL
ALBUMIN: 2.7 g/dL — AB (ref 3.5–5.0)
Albumin: 2.9 g/dL — ABNORMAL LOW (ref 3.5–5.0)
Anion gap: 10 (ref 5–15)
Anion gap: 11 (ref 5–15)
BUN: 6 mg/dL (ref 6–20)
BUN: 5 mg/dL — AB (ref 6–20)
CALCIUM: 8.2 mg/dL — AB (ref 8.9–10.3)
CHLORIDE: 103 mmol/L (ref 101–111)
CO2: 25 mmol/L (ref 22–32)
CO2: 24 mmol/L (ref 22–32)
CREATININE: 1.46 mg/dL — AB (ref 0.61–1.24)
CREATININE: 1.46 mg/dL — AB (ref 0.61–1.24)
Calcium: 8.4 mg/dL — ABNORMAL LOW (ref 8.9–10.3)
Chloride: 103 mmol/L (ref 101–111)
GFR calc Af Amer: >60 mL/min (ref >60)
GFR calc non Af Amer: 52 mL/min — ABNORMAL LOW (ref >60)
GFR, EST NON AFRICAN AMERICAN: 52 mL/min — AB (ref >60)
GLUCOSE: 125 mg/dL — AB (ref 65–99)
GLUCOSE: 138 mg/dL — AB (ref 65–99)
PHOSPHORUS: 2.3 mg/dL — AB (ref 2.5–4.6)
Phosphorus: 2.3 mg/dL — ABNORMAL LOW (ref 2.5–4.6)
Potassium: 4.5 mmol/L (ref 3.5–5.1)
Potassium: 4.7 mmol/L (ref 3.5–5.1)
SODIUM: 138 mmol/L (ref 135–145)
Sodium: 138 mmol/L (ref 135–145)

## 2017-11-20 LAB — MAGNESIUM: Magnesium: 2.4 mg/dL (ref 1.7–2.4)

## 2017-11-20 LAB — PROCALCITONIN: Procalcitonin: 2.88 ng/mL

## 2017-11-20 LAB — PROTIME-INR
INR: 1.45
PROTHROMBIN TIME: 17.5 s — AB (ref 11.4–15.2)

## 2017-11-20 LAB — AMMONIA: Ammonia: 48 umol/L — ABNORMAL HIGH (ref 9–35)

## 2017-11-20 LAB — LACTIC ACID, PLASMA: Lactic Acid, Venous: 1.3 mmol/L (ref 0.5–1.9)

## 2017-11-20 MED ORDER — QUETIAPINE FUMARATE 50 MG PO TABS
25.0000 mg | ORAL_TABLET | Freq: Every day | ORAL | Status: DC
  Administered 2017-11-20 – 2017-12-07 (×17): 25 mg via ORAL
  Filled 2017-11-20 (×19): qty 1

## 2017-11-20 MED ORDER — MIDODRINE HCL 5 MG PO TABS
10.0000 mg | ORAL_TABLET | Freq: Three times a day (TID) | ORAL | Status: DC
  Administered 2017-11-20 – 2017-12-13 (×63): 10 mg via ORAL
  Filled 2017-11-20 (×62): qty 2

## 2017-11-20 MED ORDER — FAMOTIDINE 20 MG PO TABS
20.0000 mg | ORAL_TABLET | Freq: Every day | ORAL | Status: DC
  Administered 2017-11-20 – 2017-12-07 (×18): 20 mg via ORAL
  Filled 2017-11-20 (×19): qty 1

## 2017-11-20 MED ORDER — METHOCARBAMOL 500 MG PO TABS
500.0000 mg | ORAL_TABLET | Freq: Three times a day (TID) | ORAL | Status: DC
  Administered 2017-11-20 – 2017-12-09 (×53): 500 mg via ORAL
  Filled 2017-11-20 (×57): qty 1

## 2017-11-20 MED ORDER — RIFAXIMIN 550 MG PO TABS
550.0000 mg | ORAL_TABLET | Freq: Two times a day (BID) | ORAL | Status: DC
  Administered 2017-11-20 – 2017-12-13 (×45): 550 mg via ORAL
  Filled 2017-11-20 (×46): qty 1

## 2017-11-20 MED ORDER — FOLIC ACID 1 MG PO TABS
1.0000 mg | ORAL_TABLET | Freq: Every day | ORAL | Status: DC
  Administered 2017-11-21 – 2017-12-09 (×19): 1 mg via ORAL
  Filled 2017-11-20 (×19): qty 1

## 2017-11-20 MED ORDER — OXYCODONE HCL 5 MG PO TABS
5.0000 mg | ORAL_TABLET | ORAL | Status: DC | PRN
  Administered 2017-11-20 – 2017-12-04 (×34): 10 mg via ORAL
  Administered 2017-12-04: 5 mg via ORAL
  Administered 2017-12-05 – 2017-12-09 (×11): 10 mg via ORAL
  Filled 2017-11-20 (×45): qty 2

## 2017-11-20 MED ORDER — VITAMIN B-1 100 MG PO TABS
100.0000 mg | ORAL_TABLET | Freq: Every day | ORAL | Status: DC
  Administered 2017-11-21 – 2017-12-13 (×23): 100 mg via ORAL
  Filled 2017-11-20 (×23): qty 1

## 2017-11-20 NOTE — Progress Notes (Addendum)
Nutrition Follow-up  DOCUMENTATION CODES:   Not applicable  INTERVENTION:    Diet advancement as able s/p swallow evaluation with SLP  If unable to safely advance PO diet, recommend Cortrak placement for TF: Nepro at 45 ml/h with Pro-stat 30 ml BID would provide 2144 kcal, 117 gm protein, 785 ml free water daily  NUTRITION DIAGNOSIS:   Inadequate oral intake related to lethargy/confusion as evidenced by meal completion < 50%.  Ongoing  GOAL:   Patient will meet greater than or equal to 90% of their needs  Unmet  MONITOR:   PO intake, Supplement acceptance, Labs, I & O's  ASSESSMENT:   57 yo male with PMH of GERD, osteoporosis, vitamin D deficiency, hepatomegaly, alcohol abuse, pancreatitis, DM, compression fracture, Legionnaire's DZ, PTSD who was admitted on 3/16 with decompensated cirrhosis with hepatic encephalopathy, hypoglycemia. Required intubation on 3/17.  Discussed patient in ICU rounds and with RN today. Patient was extubated on 3/28.  Enteral access unavailable.  SLP to re-evaluate patient after CRRT is discontinued on Saturday. Receiving meds crushed in applesauce. Hopeful for transition to intermediate HD on Monday. Palliative Care Team following.  Labs and medications reviewed. Remains anuric.  Intake/Output Summary (Last 24 hours) at 11/20/2017 1504 Last data filed at 11/20/2017 1400 Gross per 24 hour  Intake 433.38 ml  Output 1613 ml  Net -1179.62 ml    Diet Order:  Diet NPO time specified Except for: Sips with Meds  EDUCATION NEEDS:   No education needs have been identified at this time  Skin:  Skin Assessment: Skin Integrity Issues: Skin Integrity Issues:: Other (Comment) Other: venous stasis/non pressure injury ulcers bilateral extremities  Last BM:  3/29 (rectal tube)  Height:   Ht Readings from Last 1 Encounters:  11/07/17 5\' 8"  (1.727 m)    Weight:   Wt Readings from Last 1 Encounters:  11/20/17 182 lb 12.2 oz (82.9 kg)     Ideal Body Weight:  70 kg  BMI:  Body mass index is 27.79 kg/m.  Estimated Nutritional Needs:   Kcal:  2100-2300  Protein:  115-130 gm  Fluid:  1.2 L    Joaquin CourtsKimberly Nickey Kloepfer, RD, LDN, CNSC Pager [phone] After Hours Pager [phone]

## 2017-11-20 NOTE — Progress Notes (Signed)
PULMONARY / CRITICAL CARE MEDICINE   Name: [name] MRN: [id number] DOB: 10/03/1960    ADMISSION DATE:  11/07/2017 CONSULTATION DATE:  11/07/2017 LOS 13 days  REFERRING MD:  EDP  CHIEF COMPLAINT:  Abdominal swelling, encephalopath  57 yo male with a history of cirrhosis and etoh use, cad, and DM2 was admitted with decompensated hepatic encephalopathy.  After admission he developed worsening renal failure.  PCCM called back to see him 3/25 in the setting of worsening encephalopathy and AKI.       CULTURES: 11/07/2017 blood cultures x2>> 11/07/2017 urine culture>> 11/07/2017 paracentesis >> not done, no significant fluid  ANTIBIOTICS: Ceftriaxone 3/16 >> 3.22 vancomycin 3/17 >> 3/18 Zosyn 3/24 >>    LINES/TUBES: ETT 3/17 >>  3/20, 3/25 >  3/25 - CRRT  11/18/2017  -  MELD score 34 points -> 52% 3 mortality. Per RN / pharm got reintubated after ativan for HD cath and got intubated on 11/16/17. Per care team - full code as of 11/16/17. On dopamine for bradycardia started 2 night ago. On precdexe. Current HR 57 and MAP 98  . Patient is anuric. Platelet 16     11/19/2017 = >  MELD 33 points, CHILD C - 11 points. Platelet dropped to 13. No obvious bleeding. On CRRT. Anuric/oliguric. -> Per renal recommends CRRT only as time ltd trial of max 2 weeeks. Currently -  Folloows command and occ agitated. Did well on SBT all day yesterday. Doing SBT again. On pecedex. ET tube partially out.   OG tube out -> Patient not able to to get lactulose + xifxan; Cortrak team not here. 11/20/2017  Still getting + midodrine + octreotide   SUBJECTIVE/OVERNIGHT/INTERVAL HX 11/20/2017 -> remains extubAted. Off precedex. Sitting in chair. Watchign TV. Still on CRRT. Anuric (foley out, bladder scan negative). Constantly asking for opioids. Now limited code. Palliative care managing symptoms. Renal aims to dc CRRT 11/21/17 an HD on 11/23/17. Still NPO; speech evalating. Taking pills/meds/apple sacue  fine per Speech.  Platelt 20 and better.   Still on midodrine and octreotide and h2 blockade   VITAL SIGNS: BP 91/73   Pulse 71   Temp 97.9 F (36.6 C) (Oral)   Resp 16   Ht 5\' 8"  (1.727 m)   Wt 82.9 kg (182 lb 12.2 oz)   SpO2 (!) 89%   BMI 27.79 kg/m   HEMODYNAMICS:    VENTILATOR SETTINGS:    INTAKE / OUTPUT: I/O last 3 completed shifts: In: 2215.8 [I.V.:697.9; NG/GT:813.9; IV Piggyback:704] Out: 5503 [Other:3903; Stool:1600]  PHYSICAL EXAMINATION:   General Appearance:    Sitting in chair. Watchint TV. Wanting pain meds as soon as I walked in  Head:    Normocephalic, without obvious abnormality, atraumatic  Eyes:    PERRL - yes, conjunctiva/corneas - jaundiced but clear      Ears:    Normal external ear canals, both ears  Nose:   NG tube - no but has [place]  Throat:  ETT TUBE - no , OG tube - no  Neck:   Supple,  No enlargement/tenderness/nodules     Lungs:     Clear to auscultation bilaterally,  Chest wall:    No deformity  Heart:    S1 and S2 normal, no murmur, CVP - no.  Pressors - no  Abdomen:     Soft, no masses, no organomegaly but ASCITES + LARGE +  Genitalia:    Not done  Rectal:   not done  Extremities:  Extremities- chronic venous stasis changed     Skin:   Intact in exposed areas .     Neurologic:   Sedation - none -> RASS - +! Marland Kitchen. Moves all 4s - yes. CAM-ICU - neg . Orientation - x3+          PULMONARY Recent Labs  Lab 11/14/17 0917 11/16/17 1839  PHART 7.351 7.232*  PCO2ART 35.3 46.9  PO2ART 46.0* 78.0*  HCO3 19.7* 19.9*  TCO2 21* 21*  O2SAT 81.0 94.0    CBC Recent Labs  Lab 11/18/17 0341 11/19/17 0410 11/20/17 0636  HGB 9.8* 9.6* 9.4*  HCT 32.3* 32.0* 30.3*  WBC 22.3* 20.7* 20.7*  PLT 16* 13* 20*    COAGULATION Recent Labs  Lab 11/16/17 0401 11/17/17 0212 11/18/17 0341 11/19/17 0410 11/20/17 0437  INR 1.48 1.45 1.54 1.48 1.45    CARDIAC  No results for input(s): TROPONINI in the last 168 hours. No results for  input(s): PROBNP in the last 168 hours.   CHEMISTRY Recent Labs  Lab 11/17/17 1545 11/18/17 0341 11/18/17 1602 11/19/17 0410 11/19/17 1611 11/20/17 0437  NA 138 139 139 139 139 138  K 3.9 4.1 4.1 4.2 4.0 4.5  CL 102 102 104 104 103 103  CO2 23 25 23 25 25 25   GLUCOSE 171* 198* 209* 248* 123* 125*  BUN 21* 13 11 9 8 6   CREATININE 3.75* 2.75* 2.11* 1.73* 1.59* 1.46*  CALCIUM 8.5* 8.6* 8.4* 8.5* 8.3* 8.2*  MG 2.6* 2.6* 2.5* 2.4  --  2.4  PHOS 2.6  2.5 1.9* 2.3*  2.3* 1.8* 2.3* 2.3*   Estimated Creatinine Clearance: 60 mL/min (A) (by C-G formula based on SCr of 1.46 mg/dL (H)).   LIVER Recent Labs  Lab 11/15/17 0336 11/16/17 0401 11/17/17 0212  11/18/17 0341 11/18/17 1602 11/19/17 0410 11/19/17 1611 11/20/17 0437  AST 94* 95* 91*  --  80*  --  77*  --   --   ALT 38 32 40  --  36  --  33  --   --   ALKPHOS 207* 197* 192*  --  170*  --  158*  --   --   BILITOT 13.1* 12.3* 12.1*  --  12.3*  --  11.9*  --   --   PROT 5.9* 6.1* 6.8  --  6.5  --  6.7  --   --   ALBUMIN 2.5* 2.9* 3.4*   < > 3.3* 3.3* 3.2* 2.8* 2.7*  INR 1.45 1.48 1.45  --  1.54  --  1.48  --  1.45   < > = values in this interval not displayed.     INFECTIOUS Recent Labs  Lab 11/19/17 1110 11/20/17 0437  LATICACIDVEN  --  1.3  PROCALCITON 4.06 2.88     ENDOCRINE CBG (last 3)  Recent Labs    11/19/17 2337 11/20/17 0313 11/20/17 0723  GLUCAP 93 121* 122*         IMAGING x48h  - image(s) personally visualized  -   highlighted in bold No results found.     DISCUSSION: 57 y/o male with worsening AKI and encephalopathy in the setting of baseline cirrhosis.  Likely aspirated based on exam 3/25.  ASSESSMENT / PLAN:  PULMONARY A: Aspiration Acute respiratory failure due to inability to protect airway   11/20/2017 - > s/p extubation x 24h and maintaing well  P:   VAP prvention  CARDIOVASCULAR A:  CAD  - Off  precedex .  Off dopamine. HR 72. Maintains BP/HR P:  continu  midodrine  RENAL A:   Worsening AKI, ATN vs hepatorenal   - 11/20/2017 - anuric. Stil on CRRT. Renal reocmmends time limited trial only and plans for dc crrt 11/21/17 with starting HD 11/23/17   P:   Continue CVVHD  GASTROINTESTINAL A:   Alcoholic Cirrhosis  11/20/2017  last seen by [facility] 11/16/17. Lost OG/NG. Unable to get cortrak due to team unavailability  P:   - xifaxan + lactulose  oiraly Cotninue  + octreotide + midodrine  - NPO other than meds  HEMATOLOGIC A:   Anemia no bleeding Acute on chronic thrombocytopenia > spurious?   - 11/20/2017 -> very low platelets but improving platelts   P:  Monitor for bleeding Repeat CBC Platelet Tx if bleeding or < / = 10K or for procdure (cortrak next)  INFECTIOUS A:   Aspiration? SBP? - no clear evidencd of infectino  P:   Monitor off zosyn  ENDOCRINE A:   No acute issues  P:   Monitor glucose  NEUROLOGIC A:   Alcoholic Hepatic encephalopathy vs encephalopathy from renal diseae   - resolved confusion . C/o severe chronic pain. PEr palliative care - he is on gabapentin, IR morphine 30mg  tid, robaxin aand ibuprofen at home  P:   RASS goal 0 dC dilaudid prn Start Scheduled Robaxin + Oxycodone prn - start (as d/w Palliative care and pharm at bedside)    FAMILY  - Updates: Updated wife on 3/25, she is amenable to short term aggressive measures but understands his poor prognosis.   - Inter-disciplinary family meet or Palliative Care meeting due by:  day 7 d-  o done again with pharmacy staff and RN present -> wife agrees to NO CPR but yes for reintubation and full medical care . Patient updated 11/20/2017      The patient is critically ill with multiple organ systems failure and requires high complexity decision making for assessment and support, frequent evaluation and titration of therapies, application of advanced monitoring technologies and extensive interpretation of multiple databases.   Critical Care  Time devoted to patient care services described in this note is  30  Minutes. This time reflects time of care of this signee Dr Kalman Shan. This critical care time does not reflect procedure time, or teaching time or supervisory time of PA/NP/Med student/Med Resident etc but could involve care discussion time    Dr. Kalman Shan, M.D., [facility]C.P Pulmonary and Critical Care Medicine Staff Physician [facility]  Pulmonary and Critical Care Pager: [phone], If no answer or between  15:00h - 7:00h: call [phone]  11/20/2017 10:15 AM

## 2017-11-20 NOTE — Progress Notes (Signed)
[facility] Progress Note   57 year old man with a history of diabetes Parkinson compression fractures and alcohol abuse. He continues to drink.He presented to the ED with 2 weeks of increasing edema abdominal swelling and lethargy. Hypoglycemic due to ongoing insulin use. Intubated for airway protection. Hypotension. Treated empirically for presumed SBP although quantity of ascites was notsufficient to tap.   Assessment/ Plan:    Acute kidney injury Thought to be ATN although certainly would fit with hepatorenal syndrome - trial of midodrine octreotide and albumin ? no signs of recovery but tolerating CVVHD ? Will try net UF of 6750ml/hr starting 3/27  Increase to 13400ml/hr net on 3/28 and now back down to 6050ml/hr   Will turn off UF tomorrow and stop the CVVHD Sunday AM and look for recovery early next week. Hopefully if he still needs RRT he will tolerate iHD.   He has acute alcoholic cirrhosis  Startedon ZOXW9/60CRRT3/26 (D4)  Anemia -- Hb 9.3 Follow with critical care Check iron stores  Cirrhosis - INR 1.45   Encephalopathy Waxes and wanes   Acid, base and electrolytes reviewed  Very challenging situation;poor prognosis. Stable on CRRT While it's reasonable to continue CRRT there needs to be an endpoint; ie if no signs of improvement in 2 weeks to withdraw CRRT.  Seen on CRRT: 4K bath qb 200 RIJ temp cath 200/300/2000 pre/post/dialysate flow UF net currently 6650ml/hr and not on pressors      Subjective:   Creatinine decreasing bec of CVVHD but basically anuric  This is a difficult situation and not sure that dialysis would be indicatedbut we are giving a trial on CRRT.The initial finding would suggest ATN / HRS.We have placed on midodrine and octreotide and albumin  Extubated now and conversing with family bedside.   Objective:   BP (!) 88/72   Pulse (!) 57   Temp (!) 97.4 F (36.3 C) (Oral)   Resp (!) 24   Ht 5\' 8"   (1.727 m)   Wt 82.9 kg (182 lb 12.2 oz)   SpO2 97%   BMI 27.79 kg/m   Intake/Output Summary (Last 24 hours) at 11/20/2017 0703 Last data filed at 11/20/2017 0700 Gross per 24 hour  Intake 1025.1 ml  Output 2578 ml  Net -1552.9 ml   Weight change: -1.1 kg (-2 lb 6.8 oz)  Physical Exam: Gen: extubated CVS- RRR Icteric  RS- CTA ABD- BS present,distended EXT- 1 + edema only in the thighs GU: NO Foley    Imaging: No results found.  Labs: BMET Recent Labs  Lab 11/17/17 1014 11/17/17 1545 11/18/17 0341 11/18/17 1602 11/19/17 0410 11/19/17 1611 11/20/17 0437  NA 141 138 139 139 139 139 138  K 4.1 3.9 4.1 4.1 4.2 4.0 4.5  CL 105 102 102 104 104 103 103  CO2 24 23 25 23 25 25 25   GLUCOSE 138* 171* 198* 209* 248* 123* 125*  BUN 24* 21* 13 11 9 8 6   CREATININE 4.69* 3.75* 2.75* 2.11* 1.73* 1.59* 1.46*  CALCIUM 8.6* 8.5* 8.6* 8.4* 8.5* 8.3* 8.2*  PHOS 3.3 2.6  2.5 1.9* 2.3*  2.3* 1.8* 2.3* 2.3*   CBC Recent Labs  Lab 11/14/17 0625 11/15/17 0336 11/16/17 0401 11/17/17 0212 11/17/17 1115 11/18/17 0341 11/19/17 0410  WBC 20.0* 17.9* 20.2* 18.7*  --  22.3* 20.7*  NEUTROABS 15.2* 13.9* 16.4*  --   --  18.3*  --   HGB 10.2* 9.3* 9.4* 9.6*  --  9.8* 9.6*  HCT 32.0* 29.1*  30.5* 30.5*  --  32.3* 32.0*  MCV 114.3* 117.3* 117.3* 118.2*  --  118.3* 119.0*  PLT 110* 101* 60* 16* 15* 16* 13*    Medications:    . chlorhexidine  15 mL Mouth Rinse BID  . Chlorhexidine Gluconate Cloth  6 each Topical Daily  . folic acid  1 mg Per Tube Daily  . insulin aspart  0-15 Units Subcutaneous Q4H  . ipratropium-albuterol  3 mL Nebulization TID  . lactulose  20 g Per Tube TID   Or  . lactulose  300 mL Rectal TID  . mouth rinse  15 mL Mouth Rinse q12n4p  . midodrine  10 mg Per Tube TID WC  . octreotide  200 mcg Subcutaneous BID  . QUEtiapine  25 mg Per Tube QHS  . rifaximin  550 mg Per Tube BID  . sodium chloride flush  10-40 mL Intracatheter Q12H  . thiamine  100 mg Per  Tube Daily      Paulene Floor, MD 11/20/2017, 7:03 AM

## 2017-11-20 NOTE — Clinical Social Work Note (Signed)
Clinical Social Work Assessment  Patient Details  Name: [name] MRN: [id number] Date of Birth: 05/21/1961  Date of referral:  11/20/17               Reason for consult:  Facility Placement                Permission sought to share information with:  Family Supports Permission granted to share information::  Yes, Verbal Permission Granted  Name::     [name] Phaneuf   Agency::  family  Relationship::  spouse  Contact Information:  [name] Niedermeier [phone]  Housing/Transportation Living arrangements for the past 2 months:  Single Family Home(with wife. ) Source of Information:  Patient Patient Interpreter Needed:  None Criminal Activity/Legal Involvement Pertinent to Current Situation/Hospitalization:  No - Comment as needed Significant Relationships:  Adult Children, Spouse Lives with:  Spouse Do you feel safe going back to the place where you live?  Yes Need for family participation in patient care:  Yes (Comment)  Care giving concerns:  CSW spoke with pt at bedside. At this time pt expressed concerns related to being able to go home at the time of discharge. CSW explained some of pt's current medical needs and suggested that if these needs are still present at the time of discharge then pt may need placement to see that these needs are met.    Social Worker assessment / plan:  CSW spoke with pt at bedside. During this time CSW was informed that pt is from home with wife. CSW was informed that pt has support from wife as well as a daughter. Pt reports that pt is not interested  In SNF placement and would rather have HH services. CSW explained barriers to this at this time but informed pt that this is still an option if pt desires to go home and is medically stable enough. During this assessment pt was sitting up in chart with covers over pt. CSW will remain involved in plan of care.  Employment status:  Other (Comment)(unknown.) Insurance information:  Managed  Medicare([employer].) PT Recommendations:  Skilled Nursing Facility Information / Referral to community resources:  Skilled Nursing Facility(spoke with pt about the possible need of SNF placement at the time of discharge. )  Patient/Family's Response to care:  Pt's response to care was positive and appropriate for diagnosis given at this time. Pt presented as happy and optimistic about returning home at the end of care.  tient/Family's Understanding of and Emotional Response to Diagnosis, Current Treatment, and Prognosis:  No further questions or concerns have been presented to CSW at this time. CSW explained role in seeking placement for pt if needed at the time of discharge.  Emotional Assessment Appearance:  Appears stated age Attitude/Demeanor/Rapport:  Engaged Affect (typically observed):  Appropriate, Pleasant Orientation:  Oriented to Self, Oriented to Place, Oriented to  Time, Oriented to Situation Alcohol / Substance use:  Not Applicable Psych involvement (Current and /or in the community):  No (Comment)  Discharge Needs  Concerns to be addressed:  Care Coordination Readmission within the last 30 days:  No Current discharge risk:  Dependent with Mobility Barriers to Discharge:  Continued Medical Work up    S , LCSWA 11/20/2017, 9:51 AM  

## 2017-11-20 NOTE — Progress Notes (Addendum)
Daily Progress Note   Patient Name: [name]       Date: 11/20/2017 DOB: [birth year]  Age: 57 y.o. MRN#: [id number] Attending Physician: Kalman Shan, MD Primary Care Physician: Dettinger, Elige Radon, MD Admit Date: 11/07/2017  Reason for Consultation/Follow-up: Establishing goals of care and Psychosocial/spiritual support  Subjective: Patient continues to be critically ill, now this past week with worsening respiratory status where patient required intubation.  He was extubated on 11/19/2017.  CRRT has also been started and he is tolerating that well.  The plan is for that to come off today and begin hemodialysis on Monday.  He is no longer on any pressors, and is off Precedex.  Patient's chief concern today is unmanaged back pain.  He has been on opioids for a number of years most recently as prescribed through the [place] at morphine immediate release 30 mg 3 times a day.  He does not report that this provided a lot of relief and in fact rated his pain at best while on that regimen as a 7.  Length of Stay: 13  Current Medications: Scheduled Meds:  . chlorhexidine  15 mL Mouth Rinse BID  . Chlorhexidine Gluconate Cloth  6 each Topical Daily  . famotidine  20 mg Oral QHS  . [START ON 11/21/2017] folic acid  1 mg Oral Daily  . insulin aspart  0-15 Units Subcutaneous Q4H  . ipratropium-albuterol  3 mL Nebulization TID  . lactulose  20 g Per Tube TID   Or  . lactulose  300 mL Rectal TID  . mouth rinse  15 mL Mouth Rinse q12n4p  . methocarbamol  500 mg Oral TID  . midodrine  10 mg Oral TID WC  . octreotide  200 mcg Subcutaneous BID  . QUEtiapine  25 mg Oral QHS  . rifaximin  550 mg Oral BID  . sodium chloride flush  10-40 mL Intracatheter Q12H  . [START ON 11/21/2017] thiamine  100 mg  Oral Daily    Continuous Infusions: . sodium chloride Stopped (11/20/17 0900)  . dexmedetomidine (PRECEDEX) IV infusion Stopped (11/20/17 0900)  . feeding supplement (VITAL AF 1.2 CAL) Stopped (11/19/17 0559)  . dialysis replacement fluid (prismasate) 200 mL/hr at 11/19/17 2307  . dialysis replacement fluid (prismasate) 300 mL/hr at 11/20/17 0853  . dialysate (PRISMASATE) 2,000 mL/hr  at 11/20/17 1104  . sodium chloride      PRN Meds: sodium chloride, albuterol, fentaNYL (SUBLIMAZE) injection, fentaNYL (SUBLIMAZE) injection, heparin, oxyCODONE, sodium chloride, sodium chloride flush  Physical Exam  Constitutional: He appears well-developed.  Acutely ill-appearing older man; he is alert and in no acute distress, sitting in a recliner  Eyes: Scleral icterus is present.  Neck: Normal range of motion.  Pulmonary/Chest:  Mild increased work of breathing at rest.  Patient has now been extubated on 11/19/2017  Genitourinary:  Genitourinary Comments: On CRRT  Musculoskeletal: Normal range of motion.  Very weak Hoyer lift used to help  patient to recliner  Skin: Skin is warm and dry.  Jaundiced  Psychiatric:  Affect constricted  Nursing note and vitals reviewed.           Vital Signs: BP 97/75   Pulse 70   Temp 97.8 F (36.6 C) (Oral)   Resp (!) 23   Ht 5\' 8"  (1.727 m)   Wt 82.9 kg (182 lb 12.2 oz)   SpO2 93%   BMI 27.79 kg/m  SpO2: SpO2: 93 % O2 Device: O2 Device: Nasal Cannula O2 Flow Rate: O2 Flow Rate (L/min): 4 L/min  Intake/output summary:   Intake/Output Summary (Last 24 hours) at 11/20/2017 1226 Last data filed at 11/20/2017 1200 Gross per 24 hour  Intake 564.68 ml  Output 2027 ml  Net -1462.32 ml   LBM: Last BM Date: 11/19/17 Baseline Weight: Weight: 88.9 kg (195 lb 15.8 oz) Most recent weight: Weight: 82.9 kg (182 lb 12.2 oz)       Palliative Assessment/Data:    Flowsheet Rows     Most Recent Value  Intake Tab  Referral Department  Hospitalist  Unit  at Time of Referral  ICU  Palliative Care Primary Diagnosis  Other (Comment)  Date Notified  11/13/17  Palliative Care Type  New Palliative care  Date of Admission  11/07/17  Date first seen by Palliative Care  11/15/17  # of days Palliative referral response time  2 Day(s)  # of days IP prior to Palliative referral  6  Clinical Assessment  Palliative Performance Scale Score  40%  Pain Max last 24 hours  Not able to report  Pain Min Last 24 hours  Not able to report  Dyspnea Max Last 24 Hours  Not able to report  Dyspnea Min Last 24 hours  Not able to report  Nausea Max Last 24 Hours  Not able to report  Nausea Min Last 24 Hours  Not able to report  Anxiety Max Last 24 Hours  Not able to report  Anxiety Min Last 24 Hours  Not able to report  Other Max Last 24 Hours  Not able to report  Psychosocial & Spiritual Assessment  Palliative Care Outcomes  Patient/Family meeting held?  Yes  Who was at the meeting?  pt and wife  Palliative Care follow-up planned  Yes, Facility      Patient Active Problem List   Diagnosis Date Noted  . Goals of care, counseling/discussion   . Palliative care by specialist   . Acute renal failure (ARF) (HCC)   . Decompensation of cirrhosis of liver (HCC) 11/07/2017  . Influenza A 10/16/2017  . Closed fracture of second lumbar vertebra (HCC)   . Compression fracture of L3 lumbar vertebra with delayed healing 07/16/2015  . Compression fracture of lumbar vertebra (HCC) 07/16/2015  . Pressure ulcer 01/18/2015  . Hypoxia   . Lumbar foraminal stenosis 04/19/2013  .  Acetabulum fracture, right (HCC) 04/09/2013  . Type 2 diabetes mellitus (HCC) 04/09/2013  . CIRRHOSIS, ALCOHOLIC 06/12/2010  . LIVER FUNCTION TESTS, ABNORMAL, HX OF 10/02/2009  . PANCREATITIS, HX OF 09/27/2009  . HYPOGONADISM 08/29/2009  . VITAMIN D DEFICIENCY 08/29/2009  . OSTEOPOROSIS 02/22/2009  . Hypertension associated with diabetes (HCC) 02/08/2009  . GERD 02/08/2009  . BACK PAIN,  LUMBAR, CHRONIC 02/08/2009  . LEG EDEMA, LEFT 02/08/2009  . Hepatomegaly 02/08/2009    Palliative Care Assessment & Plan   Patient Profile: 57 y.o. male  with past medical history of DM, PTSD, chronic pain 2/2 comp FX's, sciatica, ETOH use D/O admitted on 11/07/2017 with abdominal pain, increased LE edema, hypoglycemia. Pt was intubated for airway protection. He now has worsening renal function, possibly hepatorenal syndrome.He is now extubated. He is now close to being oliguric. His hospital coarse has been complicated not only by worsening renal function but delirium requiring restraints. He is now out of restraints and more lucid.  He had further decompensation during the previous week that required intubation.  He is now been extubated.  He is on CRRT and tolerating that well with plans to transition him to hemodialysis by Monday.  Consult ordered for GOC   Assessment: Patient is alert and conversant.  Short-term memory deficits are present.  We did talk about his pain management and the difficulty in prescribing adequate opioids to address his pain in the setting of liver and kidney failure, and perhaps get it to a more manageable level such as a 3 or a 4 on a scale of 1-10.  Patient verbalized understanding and was able to state that he knows he will not be pain-free.  He did set a goal today of a pain level of 5-6  I also briefly addressed with him if he were to develop respiratory distress again would he want to be reintubated and he responded that he would like to talk to his wife further about this.  Recommendations/Plan:  Continue with partial code.  Palliative medicine provider to continue to follow over the weekend and touch base with his wife, Nonie HoyerRachel Kinslow, to discuss further his CODE STATUS  Addendum 1730: Pt does not want to be reintubated. Wife supports his decision. He is able to articulate that this could possibly be EOL and he verbalized that he understands and would  view this as "God's time". Pt does agree to BIPAP thus he is still a partial code.  We will add Robaxin 500 mg 3 times a day, DC hydromorphone, start oxycodone 5-10 mg every 4 hours as needed for pain.  Do not recommend that patient resume morphine as he is in renal failure.  A good plan possibly going forward would be  initiation of fentanyl transdermal with oxycodone for breakthrough.  Palliative medicine to monitor pain level throughout the weekend and initiate long-acting agent if appropriate   Code Status:    Code Status Orders  (From admission, onward)        Start     Ordered   11/19/17 1019  Limited resuscitation (code)  Continuous    Comments:  Pressors ok  Question Answer Comment  In the event of cardiac or respiratory ARREST: Initiate Code Blue, Call Rapid Response Yes   In the event of cardiac or respiratory ARREST: Perform CPR No   In the event of cardiac or respiratory ARREST: Perform Intubation/Mechanical Ventilation Yes   In the event of cardiac or respiratory ARREST: Use NIPPV/BiPAp only if indicated Yes  In the event of cardiac or respiratory ARREST: Administer ACLS medications if indicated No   In the event of cardiac or respiratory ARREST: Perform Defibrillation or Cardioversion if indicated No      11/19/17 1018    Code Status History    Date Active Date Inactive Code Status Order ID Comments User Context   11/12/2017 2159 11/19/2017 1018 Full Code [id number]  Leslye Peer, MD Inpatient   11/12/2017 2159 11/12/2017 2159 Partial Code [id number]  Leslye Peer, MD Inpatient   11/07/2017 1704 11/12/2017 2159 Full Code [id number]  Minor, Vilinda Blanks, NP ED   10/16/2017 1647 10/18/2017 1631 Full Code [id number]  Isaiah Blakes, PA-C ED   11/29/2016 0118 12/01/2016 1956 Full Code [id number]  Hillary Bow, DO ED   07/16/2015 1144 07/16/2015 2152 Full Code [id number]  Coletta Memos, MD Inpatient   01/11/2015 0007 01/25/2015 1815 Full Code [id number]  Kathlene Cote, PA-C ED    04/09/2013 2008 04/19/2013 2001 Full Code [id number]  Drema Dallas, MD Inpatient       Prognosis:   Unable to determine  Discharge Planning:  To Be Determined  Care plan was discussed with Dr. Gordy Levan  Thank you for allowing the Palliative Medicine Team to assist in the care of this patient.   Time In: 0900 Time Out: 0935 Total Time 35 min Prolonged Time Billed  no       Greater than 50%  of this time was spent counseling and coordinating care related to the above assessment and plan.  Irean Hong, NP  Please contact Palliative Medicine Team phone at [phone] for questions and concerns.

## 2017-11-20 NOTE — Care Management Note (Signed)
Case Management Note  Patient Details  Name: [name] MRN: [id number] Date of Birth: 09/18/1960  Subjective/Objective:            Pt admitted with cirrhosis         Action/Plan:   PTA from home.  [facility] and informed [facility].  CM also contacted CS[facility] at [facility].  [facility] with [facility].     Expected Discharge Date:                  Expected Discharge Plan:  Home/Self Care  In-House Referral:     Discharge planning Services  CM Consult  Post Acute Care Choice:    Choice offered to:     DME Arranged:    DME Agency:     HH Arranged:    HH Agency:     Status of Service:     If discussed at [employer] Length of Stay Meetings, dates discussed:    Additional Comments: 11/20/2017  CRRT will tentatively stop tomorrow - plans for possible IHD initiation on Monday   11/13/17 Pt extubated today.  PTA  from home with wife.  Per [place] in [place] - he gets medicines from both local drug store and the [facility] when needed.  PT eval requested  Cherylann ParrClaxton, Keira Bohlin S, RN 11/20/2017, 11:35 AM

## 2017-11-20 NOTE — Progress Notes (Signed)
  Speech Language Pathology Treatment: Dysphagia  Patient Details Name: [name] MRN: [id number] DOB: 03/22/1961 Today's Date: 11/20/2017 Time: [id number] SLP Time Calculation (min) (ACUTE ONLY): 13 min  Assessment / Plan / Recommendation Clinical Impression  Pt presents with minimal overt signs concerning for aspiration (one immediate cough with initial trial of thin liquid). Oral phase was improved from previous session with pt able to clear oral cavity independently in timely manor. Min verbal cues required for pt to comply with slow rate and small sips of thin liquid. Alertness was improved from previous session as well. Despite the above, given repeat intubations and moderate dysphonia, pt is at a high risk for silent aspiration; therefore, instrumental assessment of swallow is warranted pending pt coming off CRRT. MBS likely completed on next date when plan is to stop CRRT per discussion with RN.  Recommend pt continue NPO status with essential meds administered crushed in applesauce if cortrak is not placed.     HPI HPI: Pt is a 57 yo male who presented with decompensated cirrhosis with hepatic encephalopathy. PMHx includes DM, PTSD, compression fx's and pain/ debilitation, GERD, EtOH abuse with cirrhosis. Pt recently hospitalized in Feb 2019 for flu. Previously seen by SLP in 2016 following intubation, with FEES revealing reversible dysphagia and pt was eventually d/c from SLP on regular solid and thin liquid diet. Intubated 3/17-3/20 and re-intubated 3/25-3/28. Pt currently on CRRT and nasal canula      SLP Plan  MBS       Recommendations  Diet recommendations: NPO Medication Administration: Crushed with puree                Oral Care Recommendations: Oral care QID Follow up Recommendations: Other (comment)(tbd) SLP Visit Diagnosis: Dysphagia, unspecified (R13.10) Plan: MBS       GO              [name] SLP Student Clinician   [name]  Raylen [name] 11/20/2017, 10:49 AM

## 2017-11-21 ENCOUNTER — Inpatient Hospital Stay (HOSPITAL_COMMUNITY): Payer: Medicare HMO

## 2017-11-21 LAB — GLUCOSE, CAPILLARY
GLUCOSE-CAPILLARY: 92 mg/dL (ref 65–99)
GLUCOSE-CAPILLARY: 132 mg/dL — AB (ref 65–99)
Glucose-Capillary: 123 mg/dL — ABNORMAL HIGH (ref 65–99)
Glucose-Capillary: 136 mg/dL — ABNORMAL HIGH (ref 65–99)
Glucose-Capillary: 111 mg/dL — ABNORMAL HIGH (ref 65–99)

## 2017-11-21 LAB — AMMONIA: Ammonia: 45 umol/L — ABNORMAL HIGH (ref 9–35)

## 2017-11-21 LAB — CBC
HEMATOCRIT: 29.8 % — AB (ref 39.0–52.0)
HEMOGLOBIN: 8.7 g/dL — AB (ref 13.0–17.0)
MCH: 35.8 pg — ABNORMAL HIGH (ref 26.0–34.0)
MCHC: 29.2 g/dL — ABNORMAL LOW (ref 30.0–36.0)
MCV: 122.6 fL — AB (ref 78.0–100.0)
PLATELETS: 35 10*3/uL — AB (ref 150–400)
RBC: 2.43 MIL/uL — AB (ref 4.22–5.81)
RDW: 17.9 % — ABNORMAL HIGH (ref 11.5–15.5)
WBC: 22.2 10*3/uL — AB (ref 4.0–10.5)

## 2017-11-21 LAB — PROTIME-INR
INR: 1.4
Prothrombin Time: 17 seconds — ABNORMAL HIGH (ref 11.4–15.2)

## 2017-11-21 LAB — PROCALCITONIN: Procalcitonin: 3.17 ng/mL

## 2017-11-21 LAB — MAGNESIUM: Magnesium: 2.3 mg/dL (ref 1.7–2.4)

## 2017-11-21 MED ORDER — ALBUMIN HUMAN 25 % IV SOLN
93.5000 mL | Freq: Two times a day (BID) | INTRAVENOUS | Status: DC
  Administered 2017-11-21 – 2017-11-23 (×5): 93.5 mL via INTRAVENOUS
  Administered 2017-11-24: 50 mL via INTRAVENOUS
  Administered 2017-11-24: 43.5 mL via INTRAVENOUS
  Administered 2017-11-25 – 2017-11-30 (×10): 93.5 mL via INTRAVENOUS
  Filled 2017-11-21 (×20): qty 100

## 2017-11-21 MED ORDER — LACTATED RINGERS IV BOLUS
500.0000 mL | Freq: Once | INTRAVENOUS | Status: AC
  Administered 2017-11-21: 500 mL via INTRAVENOUS

## 2017-11-21 NOTE — Progress Notes (Signed)
Modified Barium Swallow Progress Note  Patient Details  Name: [name] MRN: [id number] Date of Birth: 02/12/1961  Today's Date: 11/21/2017  Modified Barium Swallow completed.  Full report located under Chart Review in the Imaging Section.  Brief recommendations include the following:  Clinical Impression  Pt has a mild oropharyngeal dysphagia that is likely multifactorial given recent intubations and generalized deconditioning, also impacted by his altered mentation although this does seem to be improving. Pt has mildly decreased bolus cohesion, allowing thin liquids to spill prematurely as far as the pyriform sinuses before triggering a swallow. He maintains good airway protection even across challenging with intermittent penetration that clears the laryngeal vestibule upon completion of the swallow. No aspiration is observed. Pt also has reduced base of tongue retraction that leaves mild vallecular residue after the swallow. Given presence of residue and considering mentation that has been fluctuating, recommend starting with Dys 3 diet and thin liquids. SLP will follow for tolerance and potential to advance.   Swallow Evaluation Recommendations       SLP Diet Recommendations: Dysphagia 3 (Mech soft) solids;Thin liquid   Liquid Administration via: Cup;Straw   Medication Administration: Whole meds with liquid   Supervision: Patient able to self feed;Intermittent supervision to cue for compensatory strategies   Compensations: Minimize environmental distractions;Slow rate;Small sips/bites   Postural Changes: Seated upright at 90 degrees   Oral Care Recommendations: Oral care BID        [name], [name] 11/21/2017,2:19 PM   [name], M.A. CCC-SLP [phone]

## 2017-11-21 NOTE — Plan of Care (Signed)
Patient is alert and oriented x 4, BP low, dr.'s are aware. Completed swallow study and had dysphasia 3 diet. Covered BG with insulin, can give lactulose PO now, family visited today, and covered his pain with po medications. Patient currently resting, will continue to monitor.

## 2017-11-21 NOTE — Progress Notes (Signed)
PULMONARY / CRITICAL CARE MEDICINE   Name: [name] MRN: [id number] DOB: [birth year]    ADMISSION DATE:  11/07/2017 CONSULTATION DATE:  11/07/2017 LOS 14 days  REFERRING MD:  EDP  CHIEF COMPLAINT:  Abdominal swelling, encephalopath  57 yo male with a history of cirrhosis and etoh use, cad, and DM2 was admitted with decompensated hepatic encephalopathy.  After admission he developed worsening renal failure.  PCCM called back to see him 3/25 in the setting of worsening encephalopathy and AKI.       CULTURES: 11/07/2017 blood cultures x2>>NEG  11/07/2017 urine culture>>multi species  11/07/2017 paracentesis >> not done, no significant fluid  ANTIBIOTICS: Ceftriaxone 3/16 >> 3.22 vancomycin 3/17 >> 3/18 Zosyn 3/24 >>    LINES/TUBES: ETT 3/17 >>  3/20, 3/25 >  3/25 - CRRT  11/18/2017  -  MELD score 34 points -> 52% 3 mortality. Per RN / pharm got reintubated after ativan for HD cath and got intubated on 11/16/17. Per care team - full code as of 11/16/17. On dopamine for bradycardia started 2 night ago. On precdexe. Current HR 57 and MAP 98  . Patient is anuric. Platelet 16     11/19/2017 = >  MELD 33 points, CHILD C - 11 points. Platelet dropped to 13. No obvious bleeding. On CRRT. Anuric/oliguric. -> Per renal recommends CRRT only as time ltd trial of max 2 weeeks. Currently -  Folloows command and occ agitated. Did well on SBT all day yesterday. Doing SBT again. On pecedex. ET tube partially out.   OG tube out -> Patient not able to to get lactulose + xifxan; Cortrak team not here.   Still getting + midodrine + octreotide   SUBJECTIVE/OVERNIGHT/INTERVAL HX Visiting with family  Return for swallow eval, rec can advance diet from liquid diet . -D 3 diet    VITAL SIGNS: BP (!) 90/43 (BP Location: Right Arm)   Pulse 61   Temp 99.1 F (37.3 C) (Oral)   Resp 19   Ht 5\' 8"  (1.727 m)   Wt 174 lb 13.2 oz (79.3 kg)   SpO2 95%   BMI 26.58 kg/m   HEMODYNAMICS:     VENTILATOR SETTINGS:    INTAKE / OUTPUT: I/O last 3 completed shifts: In: 418.4 [P.O.:90; I.V.:328.4] Out: 1587 [Other:1287; Stool:300]  PHYSICAL EXAMINATION: GEN: A/Ox3; pleasant , NAD Jaundiced    HEENT:  [place]/AT,    NECK:  Supple   no lymphadenopathy.    RESP  Few scattered rhonchi   CARD:  RRR, no m/r/g  , tr pulses intact,   GI:   Obese, distended,    Musco: Warm bil, no deformities or joint swelling noted.   Neuro: alert, no focal deficits noted.    Skin: Warm, +jaundiced                                               PULMONARY Recent Labs  Lab 11/16/17 1839  PHART 7.232*  PCO2ART 46.9  PO2ART 78.0*  HCO3 19.9*  TCO2 21*  O2SAT 94.0    CBC Recent Labs  Lab 11/19/17 0410 11/20/17 0636 11/21/17 0429  HGB 9.6* 9.4* 8.7*  HCT 32.0* 30.3* 29.8*  WBC 20.7* 20.7* 22.2*  PLT 13* 20* 35*    COAGULATION Recent Labs  Lab 11/17/17 0212 11/18/17 0341 11/19/17 0410 11/20/17 0437 11/21/17 0429  INR 1.45  1.54 1.48 1.45 1.40    CARDIAC  No results for input(s): TROPONINI in the last 168 hours. No results for input(s): PROBNP in the last 168 hours.   CHEMISTRY Recent Labs  Lab 11/18/17 0341 11/18/17 1602 11/19/17 0410 11/19/17 1611 11/20/17 0437 11/20/17 1528 11/21/17 0429  NA 139 139 139 139 138 138  --   K 4.1 4.1 4.2 4.0 4.5 4.7  --   CL 102 104 104 103 103 103  --   CO2 25 23 25 25 25 24   --   GLUCOSE 198* 209* 248* 123* 125* 138*  --   BUN 13 11 9 8 6  5*  --   CREATININE 2.75* 2.11* 1.73* 1.59* 1.46* 1.46*  --   CALCIUM 8.6* 8.4* 8.5* 8.3* 8.2* 8.4*  --   MG 2.6* 2.5* 2.4  --  2.4  --  2.3  PHOS 1.9* 2.3*  2.3* 1.8* 2.3* 2.3* 2.3*  --    Estimated Creatinine Clearance: 55.3 mL/min (A) (by C-G formula based on SCr of 1.46 mg/dL (H)).   LIVER Recent Labs  Lab 11/15/17 0336 11/16/17 0401 11/17/17 0212  11/18/17 0341 11/18/17 1602 11/19/17 0410 11/19/17 1611 11/20/17 0437 11/20/17 1528 11/21/17 0429   AST 94* 95* 91*  --  80*  --  77*  --   --   --   --   ALT 38 32 40  --  36  --  33  --   --   --   --   ALKPHOS 207* 197* 192*  --  170*  --  158*  --   --   --   --   BILITOT 13.1* 12.3* 12.1*  --  12.3*  --  11.9*  --   --   --   --   PROT 5.9* 6.1* 6.8  --  6.5  --  6.7  --   --   --   --   ALBUMIN 2.5* 2.9* 3.4*   < > 3.3* 3.3* 3.2* 2.8* 2.7* 2.9*  --   INR 1.45 1.48 1.45  --  1.54  --  1.48  --  1.45  --  1.40   < > = values in this interval not displayed.     INFECTIOUS Recent Labs  Lab 11/19/17 1110 11/20/17 0437 11/21/17 0429  LATICACIDVEN  --  1.3  --   PROCALCITON 4.06 2.88 3.17     ENDOCRINE CBG (last 3)  Recent Labs    11/21/17 0320 11/21/17 0732 11/21/17 1156  GLUCAP 123* 92 132*         IMAGING x48h  - image(s) personally visualized  -   highlighted in bold No results found.     DISCUSSION: 57 y/o male with worsening AKI and encephalopathy in the setting of baseline cirrhosis.  Likely aspirated based on exam 3/25.  ASSESSMENT / PLAN:  PULMONARY A: Aspiration Acute respiratory failure due to inability to protect airway>resolved O2 sat adequate on [place]   P:   O2 to keep sat >90%  Cont on Duoneb   CARDIOVASCULAR A:  CAD  - Off  precedex . Off dopamine.  P:  continu midodrine  RENAL A:   Worsening AKI, ATN vs hepatorenal Off CVVHD since 3/29 , Renal with plans for HD on 4/1 .   -   P:   Renal following  Replace electrolytes as indicated.    GASTROINTESTINAL A:   Alcoholic Cirrhosis  11/21/2017  last seen by [facility]  GI 11/16/17. Lost OG/NG. Unable to get cortrak due to team unavailability  P:   - xifaxan + lactulose  oiraly Cotninue  + octreotide + midodrine  - NPO other than meds  HEMATOLOGIC A:   Anemia no bleeding Acute on chronic thrombocytopenia > spurious?   - 11/21/2017 -> very low platelets but improving platelts   P:  Monitor for bleeding Repeat CBC Platelet Tx if bleeding or < / = 10K or for procdure  (cortrak next)  INFECTIOUS A:   Aspiration? SBP? - no clear evidencd of infectino  P:   Monitor off zosyn  ENDOCRINE A:   No acute issues  P:   Monitor glucose  NEUROLOGIC A:   Alcoholic Hepatic encephalopathy vs encephalopathy from renal diseae   - resolved confusion . C/o severe chronic pain. PEr palliative care - he is on gabapentin, IR morphine 30mg  tid, robaxin aand ibuprofen at home  P:   RASS goal 0 Off dilaudid prn   Scheduled Robaxin + Oxycodone prn - start (as d/w Palliative care and pharm at bedside)    FAMILY  - Updates: Updated wife on 3/25, she is amenable to short term aggressive measures but understands his poor prognosis.   - Inter-disciplinary family meet or Palliative Care meeting due by:  day 7 d-  o done again with pharmacy staff and RN present -> wife agrees to NO CPR but yes for reintubation and full medical care . Patient updated 11/21/2017  -update family and pt at bedside    Tammy Parrett NP-C  [facility]  [phone]    11/21/2017 1:20 PM

## 2017-11-21 NOTE — Progress Notes (Signed)
[facility] Progress Note   57 year old man with a history of diabetes Parkinson compression fractures and alcohol abuse. He continues to drink.He presented to the ED with 2 weeks of increasing edema abdominal swelling and lethargy. Hypoglycemic due to ongoing insulin use. Intubated for airway protection. Hypotension. Treated empirically for presumed SBP although quantity of ascites was notsufficient to tap.    Assessment/ Plan:    Acute kidney injury Thought to be ATN although certainly would fit with hepatorenal syndrome -trial ofmidodrine octreotide and albumin ? no signs of recovery but tolerated CVVHD --> off on 3/29 with no e/o recovery and zero on bladder scan this AM.  Will look for recovery and if needed plan on iHD on Monday if he tolerates; BP on low side.    He has acute alcoholic cirrhosis  Startedon ZOXW9/60CRRT3/26 (D4)  Anemia -- Hb 9.3 Follow with critical care Check iron stores  Cirrhosis - INR 1.45   Encephalopathy Waxes and wanes   Acid, base and electrolytes reviewed    The initial finding would suggest ATN/ HRS.We have placed on midodrine and octreotide and albumin  Extubated now.  Subjective:   CVVHD off on 3/29 evening but able to rinse back. Pt basicallyanuric with bladder scan this AM 0.   Denies dyspnea, n/v/f.  Sitting upright in bed.    Objective:   BP (!) 85/57   Pulse (!) 55   Temp 98.7 F (37.1 C) (Oral)   Resp (!) 28   Ht 5\' 8"  (1.727 m)   Wt 79.3 kg (174 lb 13.2 oz)   SpO2 95%   BMI 26.58 kg/m   Intake/Output Summary (Last 24 hours) at 11/21/2017 0526 Last data filed at 11/21/2017 0300 Gross per 24 hour  Intake 191.02 ml  Output 696 ml  Net -504.98 ml   Weight change: -3.6 kg (-7 lb 15 oz)  Physical Exam: Gen: extubated CVS- RRR Icteric  RS- CTA ABD- BS present,distended EXT- tr edema only in the thighs GU: NO Foley    Imaging: No results  found.  Labs: BMET Recent Labs  Lab 11/17/17 1545 11/18/17 0341 11/18/17 1602 11/19/17 0410 11/19/17 1611 11/20/17 0437 11/20/17 1528  NA 138 139 139 139 139 138 138  K 3.9 4.1 4.1 4.2 4.0 4.5 4.7  CL 102 102 104 104 103 103 103  CO2 23 25 23 25 25 25 24   GLUCOSE 171* 198* 209* 248* 123* 125* 138*  BUN 21* 13 11 9 8 6  5*  CREATININE 3.75* 2.75* 2.11* 1.73* 1.59* 1.46* 1.46*  CALCIUM 8.5* 8.6* 8.4* 8.5* 8.3* 8.2* 8.4*  PHOS 2.6  2.5 1.9* 2.3*  2.3* 1.8* 2.3* 2.3* 2.3*   CBC Recent Labs  Lab 11/14/17 0625 11/15/17 0336 11/16/17 0401  11/18/17 0341 11/19/17 0410 11/20/17 0636 11/21/17 0429  WBC 20.0* 17.9* 20.2*   < > 22.3* 20.7* 20.7* 22.2*  NEUTROABS 15.2* 13.9* 16.4*  --  18.3*  --   --   --   HGB 10.2* 9.3* 9.4*   < > 9.8* 9.6* 9.4* 8.7*  HCT 32.0* 29.1* 30.5*   < > 32.3* 32.0* 30.3* 29.8*  MCV 114.3* 117.3* 117.3*   < > 118.3* 119.0* 120.7* 122.6*  PLT 110* 101* 60*   < > 16* 13* 20* PENDING   < > = values in this interval not displayed.    Medications:    . chlorhexidine  15 mL Mouth Rinse BID  . Chlorhexidine Gluconate Cloth  6 each Topical Daily  .  famotidine  20 mg Oral QHS  . folic acid  1 mg Oral Daily  . insulin aspart  0-15 Units Subcutaneous Q4H  . ipratropium-albuterol  3 mL Nebulization TID  . lactulose  20 g Per Tube TID   Or  . lactulose  300 mL Rectal TID  . mouth rinse  15 mL Mouth Rinse q12n4p  . methocarbamol  500 mg Oral TID  . midodrine  10 mg Oral TID WC  . octreotide  200 mcg Subcutaneous BID  . QUEtiapine  25 mg Oral QHS  . rifaximin  550 mg Oral BID  . sodium chloride flush  10-40 mL Intracatheter Q12H  . thiamine  100 mg Oral Daily      Paulene Floor, MD 11/21/2017, 5:26 AM

## 2017-11-22 ENCOUNTER — Inpatient Hospital Stay (HOSPITAL_COMMUNITY): Payer: Medicare HMO

## 2017-11-22 LAB — CBC WITH DIFFERENTIAL/PLATELET
BASOS PCT: 1 %
Basophils Absolute: 0.2 10*3/uL — ABNORMAL HIGH (ref 0.0–0.1)
EOS PCT: 1 %
Eosinophils Absolute: 0.2 10*3/uL (ref 0.0–0.7)
HEMATOCRIT: 28.9 % — AB (ref 39.0–52.0)
Hemoglobin: 8.6 g/dL — ABNORMAL LOW (ref 13.0–17.0)
Lymphocytes Relative: 12 %
Lymphs Abs: 2 10*3/uL (ref 0.7–4.0)
MCH: 36.4 pg — AB (ref 26.0–34.0)
MCHC: 29.8 g/dL — ABNORMAL LOW (ref 30.0–36.0)
MCV: 122.5 fL — AB (ref 78.0–100.0)
MONOS PCT: 7 %
Monocytes Absolute: 1.2 10*3/uL — ABNORMAL HIGH (ref 0.1–1.0)
NEUTROS PCT: 79 %
Neutro Abs: 13.3 10*3/uL — ABNORMAL HIGH (ref 1.7–7.7)
Platelets: 36 10*3/uL — ABNORMAL LOW (ref 150–400)
RBC: 2.36 MIL/uL — AB (ref 4.22–5.81)
RDW: 17.8 % — ABNORMAL HIGH (ref 11.5–15.5)
WBC: 16.9 10*3/uL — AB (ref 4.0–10.5)

## 2017-11-22 LAB — COMPREHENSIVE METABOLIC PANEL
ALT: 38 U/L (ref 17–63)
AST: 86 U/L — AB (ref 15–41)
Albumin: 2.9 g/dL — ABNORMAL LOW (ref 3.5–5.0)
Alkaline Phosphatase: 144 U/L — ABNORMAL HIGH (ref 38–126)
Anion gap: 11 (ref 5–15)
BUN: 13 mg/dL (ref 6–20)
CALCIUM: 8.6 mg/dL — AB (ref 8.9–10.3)
CHLORIDE: 104 mmol/L (ref 101–111)
CO2: 24 mmol/L (ref 22–32)
Creatinine, Ser: 3.54 mg/dL — ABNORMAL HIGH (ref 0.61–1.24)
GFR, EST AFRICAN AMERICAN: 21 mL/min — AB (ref >60)
GFR, EST NON AFRICAN AMERICAN: 18 mL/min — AB (ref >60)
Glucose, Bld: 138 mg/dL — ABNORMAL HIGH (ref 65–99)
Potassium: 4.2 mmol/L (ref 3.5–5.1)
Sodium: 139 mmol/L (ref 135–145)
TOTAL PROTEIN: 6.4 g/dL — AB (ref 6.5–8.1)
Total Bilirubin: 13.2 mg/dL — ABNORMAL HIGH (ref 0.3–1.2)

## 2017-11-22 LAB — GLUCOSE, CAPILLARY
Glucose-Capillary: 130 mg/dL — ABNORMAL HIGH (ref 65–99)
Glucose-Capillary: 132 mg/dL — ABNORMAL HIGH (ref 65–99)
Glucose-Capillary: 134 mg/dL — ABNORMAL HIGH (ref 65–99)
Glucose-Capillary: 138 mg/dL — ABNORMAL HIGH (ref 65–99)
Glucose-Capillary: 147 mg/dL — ABNORMAL HIGH (ref 65–99)
Glucose-Capillary: 161 mg/dL — ABNORMAL HIGH (ref 65–99)
Glucose-Capillary: 89 mg/dL (ref 65–99)

## 2017-11-22 LAB — MAGNESIUM: MAGNESIUM: 2.6 mg/dL — AB (ref 1.7–2.4)

## 2017-11-22 LAB — LACTIC ACID, PLASMA: Lactic Acid, Venous: 1.3 mmol/L (ref 0.5–1.9)

## 2017-11-22 LAB — AMMONIA: Ammonia: 51 umol/L — ABNORMAL HIGH (ref 9–35)

## 2017-11-22 LAB — PROTIME-INR
INR: 1.45
PROTHROMBIN TIME: 17.5 s — AB (ref 11.4–15.2)

## 2017-11-22 LAB — PHOSPHORUS: PHOSPHORUS: 3.6 mg/dL (ref 2.5–4.6)

## 2017-11-22 MED ORDER — GI COCKTAIL ~~LOC~~
30.0000 mL | Freq: Once | ORAL | Status: AC
  Administered 2017-11-22: 30 mL via ORAL
  Filled 2017-11-22: qty 30

## 2017-11-22 NOTE — Progress Notes (Signed)
   Feeling bloated and full Wants something  Plan Gi cocktail x 1  Dr. Kalman ShanMurali Bobbyjoe Pabst, M.D., [facility]C.P Pulmonary and Critical Care Medicine Staff Physician, [facility] Director - Interstitial Lung Disease  Program  Pulmonary Fibrosis [facility] - Care Center Network at [facility] [place], [place], [id number]  Pager: [phone], If no answer or between  15:00h - 7:00h: call [phone] Telephone: [phone]

## 2017-11-22 NOTE — Progress Notes (Signed)
PULMONARY / CRITICAL CARE MEDICINE   Name: [name] MRN: [id number] DOB: 04-20-1961    ADMISSION DATE:  11/07/2017 CONSULTATION DATE:  11/07/2017 LOS 15 days  REFERRING MD:  EDP  CHIEF COMPLAINT:  Abdominal swelling, encephalopath  57 yo male with a history of cirrhosis and etoh use, cad, and DM2 was admitted with decompensated hepatic encephalopathy.  After admission he developed worsening renal failure.  PCCM called back to see him 3/25 in the setting of worsening encephalopathy and AKI.       CULTURES: 11/07/2017 blood cultures x2>>NEG  11/07/2017 urine culture>>multi species  11/07/2017 paracentesis >> not done, no significant fluid  ANTIBIOTICS: Ceftriaxone 3/16 >> 3.22 vancomycin 3/17 >> 3/18 Zosyn 3/24 >>    LINES/TUBES: ETT 3/17 >>  3/20, 3/25 >  3/25 - CRRT  11/18/2017  -  MELD score 34 points -> 52% 3 mortality. Per RN / pharm got reintubated after ativan for HD cath and got intubated on 11/16/17. Per care team - full code as of 11/16/17. On dopamine for bradycardia started 2 night ago. On precdexe. Current HR 57 and MAP 98  . Patient is anuric. Platelet 16     11/19/2017 = >  MELD 33 points, CHILD C - 11 points. Platelet dropped to 13. No obvious bleeding. On CRRT. Anuric/oliguric. -> Per renal recommends CRRT only as time ltd trial of max 2 weeeks. Currently -  Folloows command and occ agitated. Did well on SBT all day yesterday. Doing SBT again. On pecedex. ET tube partially out.   OG tube out -> Patient not able to to get lactulose + xifxan; Cortrak team not here.   Still getting + midodrine + octreotide   3/29 -s tarted on meds for chronic pain with pharm and palliative input  3/30 - visiting with family . Return for swallow eval, rec can advance diet from liquid diet . -D 3 diet . Off CRRT. Low dose precedex +   SUBJECTIVE/OVERNIGHT/INTERVAL HX 3/31- MELD still 34  renal plans iHD 11/23/17 and with goal if does not tolerate it no return to  CRRT. Now DNR, DNI but still full medical care.  Remains anuric. Creat rising to 3.5. With bic and k doing ok .  off precedex x 24h. Oriented and not delrium. On lactulose + Xifaxan . Also on octreotide + albumin  + midodrine for possible hepato renal  = by renal service    VITAL SIGNS: BP 103/72 (BP Location: Right Arm)   Pulse 79   Temp 98.6 F (37 C) (Oral)   Resp 18   Ht 5\' 8"  (1.727 m)   Wt 80.3 kg (177 lb 0.5 oz)   SpO2 92%   BMI 26.92 kg/m   HEMODYNAMICS:    VENTILATOR SETTINGS:    INTAKE / OUTPUT: I/O last 3 completed shifts: In: 950.1 [I.V.:156.6; Other:700; IV Piggyback:93.5] Out: 2100 [Stool:2100]    Intake/Output Summary (Last 24 hours) at 11/22/2017 1032 Last data filed at 11/22/2017 0800 Gross per 24 hour  Intake 939.5 ml  Output 1600 ml  Net -660.5 ml     PHYSICAL EXAMINATION:  General Appearance:    Looks much better. Jaundiced  Head:    Normocephalic, without obvious abnormality, atraumatic  Eyes:    PERRL - yes, conjunctiva/corneas - clear      Ears:    Normal external ear canals, both ears  Nose:   NG tube - no  Throat:  ETT TUBE - no , OG tube - no  Neck:   Supple,  No enlargement/tenderness/nodules     Lungs:     Clear to auscultation bilaterally,   Chest wall:    No deformity  Heart:    S1 and S2 normal, no murmur, CVP - no.  Pressors - no  Abdomen:     Soft, no masses, no organomegaly but ascites + - looks better  Genitalia:    Not done  Rectal:   not done  Extremities:   Extremities- jaundiced with edema     Skin:   Intact in exposed areas .      Neurologic:   Sedation - none -> RASS - +1 . Moves all 4s - yes. CAM-ICU - neg . Orientation - x3+                                                 PULMONARY Recent Labs  Lab 11/16/17 1839  PHART 7.232*  PCO2ART 46.9  PO2ART 78.0*  HCO3 19.9*  TCO2 21*  O2SAT 94.0    CBC Recent Labs  Lab 11/20/17 0636 11/21/17 0429 11/22/17 0430  HGB 9.4* 8.7* 8.6*  HCT  30.3* 29.8* 28.9*  WBC 20.7* 22.2* 16.9*  PLT 20* 35* 36*    COAGULATION Recent Labs  Lab 11/18/17 0341 11/19/17 0410 11/20/17 0437 11/21/17 0429 11/22/17 0945  INR 1.54 1.48 1.45 1.40 1.45    CARDIAC  No results for input(s): TROPONINI in the last 168 hours. No results for input(s): PROBNP in the last 168 hours.   CHEMISTRY Recent Labs  Lab 11/18/17 1602 11/19/17 0410 11/19/17 1611 11/20/17 0437 11/20/17 1528 11/21/17 0429 11/22/17 0430  NA 139 139 139 138 138  --  139  K 4.1 4.2 4.0 4.5 4.7  --  4.2  CL 104 104 103 103 103  --  104  CO2 23 25 25 25 24   --  24  GLUCOSE 209* 248* 123* 125* 138*  --  138*  BUN 11 9 8 6  5*  --  13  CREATININE 2.11* 1.73* 1.59* 1.46* 1.46*  --  3.54*  CALCIUM 8.4* 8.5* 8.3* 8.2* 8.4*  --  8.6*  MG 2.5* 2.4  --  2.4  --  2.3 2.6*  PHOS 2.3*  2.3* 1.8* 2.3* 2.3* 2.3*  --  3.6   Estimated Creatinine Clearance: 22.8 mL/min (A) (by C-G formula based on SCr of 3.54 mg/dL (H)).   LIVER Recent Labs  Lab 11/16/17 0401 11/17/17 0212  11/18/17 0341  11/19/17 0410 11/19/17 1611 11/20/17 0437 11/20/17 1528 11/21/17 0429 11/22/17 0430 11/22/17 0945  AST 95* 91*  --  80*  --  77*  --   --   --   --  86*  --   ALT 32 40  --  36  --  33  --   --   --   --  38  --   ALKPHOS 197* 192*  --  170*  --  158*  --   --   --   --  144*  --   BILITOT 12.3* 12.1*  --  12.3*  --  11.9*  --   --   --   --  13.2*  --   PROT 6.1* 6.8  --  6.5  --  6.7  --   --   --   --  6.4*  --   ALBUMIN 2.9* 3.4*   < > 3.3*   < > 3.2* 2.8* 2.7* 2.9*  --  2.9*  --   INR 1.48 1.45  --  1.54  --  1.48  --  1.45  --  1.40  --  1.45   < > = values in this interval not displayed.     INFECTIOUS Recent Labs  Lab 11/19/17 1110 11/20/17 0437 11/21/17 0429 11/22/17 0430  LATICACIDVEN  --  1.3  --  1.3  PROCALCITON 4.06 2.88 3.17  --      ENDOCRINE CBG (last 3)  Recent Labs    11/21/17 2359 11/22/17 0404 11/22/17 0741  GLUCAP 161* 130* 147*          IMAGING x48h  - image(s) personally visualized  -   highlighted in bold Dg Chest Port 1 View  Result Date: 11/22/2017 CLINICAL DATA:  Respiratory failure EXAM: PORTABLE CHEST 1 VIEW COMPARISON:  11/16/2017 and prior exams FINDINGS: Cardiomediastinal silhouette is unchanged. An endotracheal tube and NG tube have been removed. A RIGHT IJ central venous catheter with tip overlying the mid SVC again noted. Mild bilateral interstitial opacities and LEFT basilar atelectasis again noted. There is no evidence of pneumothorax. IMPRESSION: Endotracheal tube and NG tube removal. Otherwise unchanged appearance of the chest. Electronically Signed   By: Harmon Pier M.D.   On: 11/22/2017 07:17   Dg Swallowing Func-speech Pathology  Result Date: 11/21/2017 Objective Swallowing Evaluation: Type of Study: MBS-Modified Barium Swallow Study  Patient Details Name: [name] MRN: [id number] Date of Birth: Jul 27, 1961 Today's Date: 11/21/2017 Time: SLP Start Time (ACUTE ONLY): 1222 -SLP Stop Time (ACUTE ONLY): 1233 SLP Time Calculation (min) (ACUTE ONLY): 11 min Past Medical History: Past Medical History: Diagnosis Date . Chronic lower back pain  . Compression fracture   lumbar 3 . Diabetes mellitus  . Fracture acetabulum-closed (HCC) 04/09/2013 . GERD (gastroesophageal reflux disease)  . H/O Legionnaire's disease  . Hepatomegaly  . Migraine  . Osteoporosis  . Pancreatitis  . PTSD (post-traumatic stress disorder)  . Vitamin D deficiency  Past Surgical History: Past Surgical History: Procedure Laterality Date . BACK SURGERY   . CHOLECYSTECTOMY   . HERNIA REPAIR   . KYPHOPLASTY N/A 07/16/2015  Procedure: Lumbar three kyphoplasty;  Surgeon: Coletta Memos, MD;  Location: [facility];  Service: Neurosurgery;  Laterality: N/A;  Lumbar three kyphoplasty . LAMINECTOMY   . MASS EXCISION Left 06/12/2017  Procedure: EXCISION LEFT AXILLARY SEBACEOUS CYST;  Surgeon: Abigail Miyamoto, MD;  Location: [facility];   Service: General;  Laterality: Left; . STERNUM FRACTURE SURGERY   HPI: Pt is a 57 yo male who presented with decompensated cirrhosis with hepatic encephalopathy. PMHx includes DM, PTSD, compression fx's and pain/ debilitation, GERD, EtOH abuse with cirrhosis. Pt recently hospitalized in Feb 2019 for flu. Previously seen by SLP in 2016 following intubation, with FEES revealing reversible dysphagia and pt was eventually d/c from SLP on regular solid and thin liquid diet. Intubated 3/17-3/20 and re-intubated 3/25-3/28. Pt currently on CRRT and nasal canula  Subjective: alert, eager for POs Assessment / Plan / Recommendation CHL IP CLINICAL IMPRESSIONS 11/21/2017 Clinical Impression Pt has a mild oropharyngeal dysphagia that is likely multifactorial given recent intubations and generalized deconditioning, also impacted by his altered mentation although this does seem to be improving. Pt has mildly decreased bolus cohesion, allowing thin liquids to spill prematurely as far as the pyriform sinuses before triggering a swallow.  He maintains good airway protection even across challenging with intermittent penetration that clears the laryngeal vestibule upon completion of the swallow. No aspiration is observed. Pt also has reduced base of tongue retraction that leaves mild vallecular residue after the swallow. Given presence of residue and considering mentation that has been fluctuating, recommend starting with Dys 3 diet and thin liquids. SLP will follow for tolerance and potential to advance. SLP Visit Diagnosis Dysphagia, oropharyngeal phase (R13.12) Attention and concentration deficit following -- Frontal lobe and executive function deficit following -- Impact on safety and function Mild aspiration risk   CHL IP TREATMENT RECOMMENDATION 11/21/2017 Treatment Recommendations Therapy as outlined in treatment plan below   Prognosis 11/21/2017 Prognosis for Safe Diet Advancement Good Barriers to Reach Goals -- Barriers/Prognosis  Comment -- CHL IP DIET RECOMMENDATION 11/21/2017 SLP Diet Recommendations Dysphagia 3 (Mech soft) solids;Thin liquid Liquid Administration via Cup;Straw Medication Administration Whole meds with liquid Compensations Minimize environmental distractions;Slow rate;Small sips/bites Postural Changes Seated upright at 90 degrees   CHL IP OTHER RECOMMENDATIONS 11/21/2017 Recommended Consults -- Oral Care Recommendations Oral care BID Other Recommendations --   CHL IP FOLLOW UP RECOMMENDATIONS 11/21/2017 Follow up Recommendations None   CHL IP FREQUENCY AND DURATION 11/21/2017 Speech Therapy Frequency (ACUTE ONLY) min 2x/week Treatment Duration 2 weeks      CHL IP ORAL PHASE 11/21/2017 Oral Phase Impaired Oral - Pudding Teaspoon -- Oral - Pudding Cup -- Oral - Honey Teaspoon -- Oral - Honey Cup -- Oral - Nectar Teaspoon -- Oral - Nectar Cup -- Oral - Nectar Straw -- Oral - Thin Teaspoon -- Oral - Thin Cup Decreased bolus cohesion;Premature spillage Oral - Thin Straw Decreased bolus cohesion;Premature spillage Oral - Puree WFL Oral - Mech Soft WFL Oral - Regular -- Oral - Multi-Consistency -- Oral - Pill WFL Oral Phase - Comment --  CHL IP PHARYNGEAL PHASE 11/21/2017 Pharyngeal Phase Impaired Pharyngeal- Pudding Teaspoon -- Pharyngeal -- Pharyngeal- Pudding Cup -- Pharyngeal -- Pharyngeal- Honey Teaspoon -- Pharyngeal -- Pharyngeal- Honey Cup -- Pharyngeal -- Pharyngeal- Nectar Teaspoon -- Pharyngeal -- Pharyngeal- Nectar Cup -- Pharyngeal -- Pharyngeal- Nectar Straw -- Pharyngeal -- Pharyngeal- Thin Teaspoon -- Pharyngeal -- Pharyngeal- Thin Cup Reduced tongue base retraction;Pharyngeal residue - valleculae;Penetration/Aspiration during swallow Pharyngeal Material enters airway, remains ABOVE vocal cords then ejected out Pharyngeal- Thin Straw Reduced tongue base retraction;Pharyngeal residue - valleculae;Penetration/Aspiration during swallow Pharyngeal Material enters airway, remains ABOVE vocal cords then ejected out  Pharyngeal- Puree Reduced tongue base retraction;Pharyngeal residue - valleculae Pharyngeal -- Pharyngeal- Mechanical Soft Reduced tongue base retraction;Pharyngeal residue - valleculae Pharyngeal -- Pharyngeal- Regular -- Pharyngeal -- Pharyngeal- Multi-consistency -- Pharyngeal -- Pharyngeal- Pill Reduced tongue base retraction Pharyngeal -- Pharyngeal Comment --  CHL IP CERVICAL ESOPHAGEAL PHASE 11/21/2017 Cervical Esophageal Phase WFL Pudding Teaspoon -- Pudding Cup -- Honey Teaspoon -- Honey Cup -- Nectar Teaspoon -- Nectar Cup -- Nectar Straw -- Thin Teaspoon -- Thin Cup -- Thin Straw -- Puree -- Mechanical Soft -- Regular -- Multi-consistency -- Pill -- Cervical Esophageal Comment -- No flowsheet data found. Maxcine Ham 11/21/2017, 2:20 PM  Maxcine Ham, M.A. CCC-SLP [phone]                 DISCUSSION: 57 y/o male with worsening AKI and encephalopathy in the setting of baseline cirrhosis.  Likely aspirated based on exam 3/25.  ASSESSMENT / PLAN:  PULMONARY A: Aspiration Acute respiratory failure due to inability to protect airway>resolved O2 sat adequate on [place]   - doing well  on 11/22/17 - several days post extubation  P:   O2 to keep sat >90%  Cont on Duoneb   CARDIOVASCULAR A:  CAD  - Off  precedex . Off dopamine.  P:  continu midodrine + octreotide + albumin for perfusion of the kidneys  RENAL A:   Worsening AKI, ATN vs hepatorenal Off CVVHD since 3/29 , Renal with plans for HD on 4/1 .   -  Creat rising 11/22/17 but  K and bic ok  P:   Renal following  Plan for iHD 11/23/17 but if fails no return to CRRT    GASTROINTESTINAL A:   Alcoholic Cirrhosis  11/22/2017  last seen by [facility] 11/16/17.   P:   - xifaxan + lactulose  oiraly Cotninue  + octreotide + midodrine  + albumin - diet per speech notes  HEMATOLOGIC A:   Anemia no bleeding Acute on chronic thrombocytopenia > spurious?   - 11/22/2017 -> very low platelets but improving  platelts   P:  Monitor for bleeding Repeat CBC Platelet Tx if bleeding or < / = 10K or for procdure (cortrak next)  INFECTIOUS A:   Aspiration? SBP? - no clear evidencd of infectino  P:   Monitor off zosyn  ENDOCRINE A:   No acute issues  P:   Monitor glucose  NEUROLOGIC A:   Alcoholic Hepatic encephalopathy vs encephalopathy from renal diseae   - resolved confusion . Has  severe chronic pain. PEr palliative care - he is on gabapentin, IR morphine 30mg  tid, robaxin aand ibuprofen at home.   P:   RASS goal 0 Off dilaudid prn   Scheduled Robaxin + Oxycodone prn - start (as d/w Palliative care and pharm at bedside) since 10/24/17/19    FAMILY  - Updates: Updated wife on 3/25, she is amenable to short term aggressive measures but understands his poor prognosis. Patient updated 11/22/2017   - Inter-disciplinary family meet or Palliative Care meeting due by:  day 7 d-  o done again with pharmacy staff and RN present -> wife agrees to NO CPR but yes for reintubation and full medical care . Patient updated 11/22/2017   DISPO Move to med surg [facility] primary from 11/23/17 and ccm off - d/w Dr Mal MistyNEtty of team 1    Dr. Kalman ShanMurali Dekota Kirlin, M.D., [facility].P Pulmonary and Critical Care Medicine Staff Physician, [facility] Center Director - Interstitial Lung Disease  Program  Pulmonary Fibrosis [facility] - Care Center Network at [facility] [place], [place], [address]  Pager: [phone], If no answer or between  15:00h - 7:00h: call [phone] Telephone: [phone]

## 2017-11-22 NOTE — Progress Notes (Signed)
1358 Received pt as a transfer from 45M. Patient alert oriented x4. Resting in bed. Family at bedside. Will continue to monitor.

## 2017-11-22 NOTE — Progress Notes (Signed)
[name] Progress Note   57 year old man with a history of diabetes Parkinson compression fractures and alcohol abuse. He continues to drink.He presented to the ED with 2 weeks of increasing edema abdominal swelling and lethargy. Hypoglycemic due to ongoing insulin use. Intubated for airway protection. Hypotension. Treated empirically for presumed SBP although quantity of ascites was notsufficient to tap.    Assessment/ Plan:    Acute kidney injury Thought to be ATN although certainly would fit with hepatorenal syndrome -trial ofmidodrine octreotide and albumin ? no signs of recovery but tolerated CVVHD --> off on 3/29 with no e/o recovery and zero on bladder scan this AM.  Will look for recovery and if needed plan on iHD on Monday if he tolerates.   He is net negative from diarrhea but hasn't made much urine since 3/17.  Not likely to be a long term candidate for dialysis.  If he doesn't tolerate iHD I would favor not restarting CRRT.   He has acute alcoholic cirrhosis  Startedon ZOXW9/60 (D4)  Anemia -- Hb 9.3 Follow with critical care Check iron stores  Cirrhosis - INR 1.45   Encephalopathy Waxes and wanes   Acid, base and electrolytes reviewed    The initial finding would suggest ATN/ HRS.We have placed on midodrine and octreotide and albumin  Extubated now.    Subjective:   CVVHD off on 3/29 evening but able to rinse back. Pt basicallyanuric by  bladder scan.   Denies dyspnea, n/v/f.   Sitting upright in bed.  C/o back pain   Objective:   BP 121/62   Pulse 80   Temp 98.6 F (37 C) (Oral)   Resp 18   Ht 5\' 8"  (1.727 m)   Wt 80.3 kg (177 lb 0.5 oz)   SpO2 91%   BMI 26.92 kg/m   Intake/Output Summary (Last 24 hours) at 11/22/2017 0818 Last data filed at 11/22/2017 0500 Gross per 24 hour  Intake 859.6 ml  Output 2100 ml  Net -1240.4 ml   Weight change: 1 kg (2 lb 3.3 oz)  Physical  Exam: [id number] CVS- RRR Icteric  RS- CTA ABD- BS present,distended EXT- tr edema only in the thighs [id number]:[id number], no scrotal edema    Imaging: Dg Chest Port 1 View  Result Date: 11/22/2017 CLINICAL DATA:  Respiratory failure EXAM: PORTABLE CHEST 1 VIEW COMPARISON:  11/16/2017 and prior exams FINDINGS: Cardiomediastinal silhouette is unchanged. An endotracheal tube and NG tube have been removed. A RIGHT IJ central venous catheter with tip overlying the mid SVC again noted. Mild bilateral interstitial opacities and LEFT basilar atelectasis again noted. There is no evidence of pneumothorax. IMPRESSION: Endotracheal tube and NG tube removal. Otherwise unchanged appearance of the chest. Electronically Signed   By: Harmon Pier M.D.   On: 11/22/2017 07:17   Dg Swallowing Func-speech Pathology  Result Date: 11/21/2017 Objective Swallowing Evaluation: Type of Study: MBS-Modified Barium Swallow Study  Patient Details Name: [name] MRN: [id number] Date of Birth: [birth year] Today's Date: 11/21/2017 Time: SLP Start Time (ACUTE ONLY): 1222 -SLP Stop Time (ACUTE ONLY): 1233 SLP Time Calculation (min) (ACUTE ONLY): 11 min Past Medical History: Past Medical History: Diagnosis Date . Chronic lower back pain  . Compression fracture   lumbar 3 . Diabetes mellitus  . Fracture acetabulum-closed (HCC) 04/09/2013 . GERD (gastroesophageal reflux disease)  . H/O Legionnaire's disease  . Hepatomegaly  . Migraine  . Osteoporosis  . Pancreatitis  . PTSD (post-traumatic stress disorder)  . Vitamin D deficiency  Past Surgical History: Past Surgical History: Procedure Laterality Date . BACK SURGERY   . CHOLECYSTECTOMY   . HERNIA REPAIR   . KYPHOPLASTY N/A 07/16/2015  Procedure: Lumbar three kyphoplasty;  Surgeon: Coletta Memos, MD;  Location: [facility];  Service: Neurosurgery;  Laterality: N/A;  Lumbar three kyphoplasty . LAMINECTOMY   . MASS EXCISION Left 06/12/2017  Procedure: EXCISION LEFT AXILLARY SEBACEOUS  CYST;  Surgeon: Abigail Miyamoto, MD;  Location: [facility];  Service: General;  Laterality: Left; . STERNUM FRACTURE SURGERY   HPI: Pt is a 57 yo male who presented with decompensated cirrhosis with hepatic encephalopathy. PMHx includes DM, PTSD, compression fx's and pain/ debilitation, GERD, EtOH abuse with cirrhosis. Pt recently hospitalized in Feb 2019 for flu. Previously seen by SLP in 2016 following intubation, with FEES revealing reversible dysphagia and pt was eventually d/c from SLP on regular solid and thin liquid diet. Intubated 3/17-3/20 and re-intubated 3/25-3/28. Pt currently on CRRT and nasal canula  Subjective: alert, eager for POs Assessment / Plan / Recommendation CHL IP CLINICAL IMPRESSIONS 11/21/2017 Clinical Impression Pt has a mild oropharyngeal dysphagia that is likely multifactorial given recent intubations and generalized deconditioning, also impacted by his altered mentation although this does seem to be improving. Pt has mildly decreased bolus cohesion, allowing thin liquids to spill prematurely as far as the pyriform sinuses before triggering a swallow. He maintains good airway protection even across challenging with intermittent penetration that clears the laryngeal vestibule upon completion of the swallow. No aspiration is observed. Pt also has reduced base of tongue retraction that leaves mild vallecular residue after the swallow. Given presence of residue and considering mentation that has been fluctuating, recommend starting with Dys 3 diet and thin liquids. SLP will follow for tolerance and potential to advance. SLP Visit Diagnosis Dysphagia, oropharyngeal phase (R13.12) Attention and concentration deficit following -- Frontal lobe and executive function deficit following -- Impact on safety and function Mild aspiration risk   CHL IP TREATMENT RECOMMENDATION 11/21/2017 Treatment Recommendations Therapy as outlined in treatment plan below   Prognosis 11/21/2017 Prognosis  for Safe Diet Advancement Good Barriers to Reach Goals -- Barriers/Prognosis Comment -- CHL IP DIET RECOMMENDATION 11/21/2017 SLP Diet Recommendations Dysphagia 3 (Mech soft) solids;Thin liquid Liquid Administration via Cup;Straw Medication Administration Whole meds with liquid Compensations Minimize environmental distractions;Slow rate;Small sips/bites Postural Changes Seated upright at 90 degrees   CHL IP OTHER RECOMMENDATIONS 11/21/2017 Recommended Consults -- Oral Care Recommendations Oral care BID Other Recommendations --   CHL IP FOLLOW UP RECOMMENDATIONS 11/21/2017 Follow up Recommendations None   CHL IP FREQUENCY AND DURATION 11/21/2017 Speech Therapy Frequency (ACUTE ONLY) min 2x/week Treatment Duration 2 weeks      CHL IP ORAL PHASE 11/21/2017 Oral Phase Impaired Oral - Pudding Teaspoon -- Oral - Pudding Cup -- Oral - Honey Teaspoon -- Oral - Honey Cup -- Oral - Nectar Teaspoon -- Oral - Nectar Cup -- Oral - Nectar Straw -- Oral - Thin Teaspoon -- Oral - Thin Cup Decreased bolus cohesion;Premature spillage Oral - Thin Straw Decreased bolus cohesion;Premature spillage Oral - Puree WFL Oral - Mech Soft WFL Oral - Regular -- Oral - Multi-Consistency -- Oral - Pill WFL Oral Phase - Comment --  CHL IP PHARYNGEAL PHASE 11/21/2017 Pharyngeal Phase Impaired Pharyngeal- Pudding Teaspoon -- Pharyngeal -- Pharyngeal- Pudding Cup -- Pharyngeal -- Pharyngeal- Honey Teaspoon -- Pharyngeal -- Pharyngeal- Honey Cup -- Pharyngeal -- Pharyngeal- Nectar Teaspoon -- Pharyngeal -- Pharyngeal- Nectar Cup -- Pharyngeal -- Pharyngeal- Nectar  Straw -- Pharyngeal -- Pharyngeal- Thin Teaspoon -- Pharyngeal -- Pharyngeal- Thin Cup Reduced tongue base retraction;Pharyngeal residue - valleculae;Penetration/Aspiration during swallow Pharyngeal Material enters airway, remains ABOVE vocal cords then ejected out Pharyngeal- Thin Straw Reduced tongue base retraction;Pharyngeal residue - valleculae;Penetration/Aspiration during swallow Pharyngeal  Material enters airway, remains ABOVE vocal cords then ejected out Pharyngeal- Puree Reduced tongue base retraction;Pharyngeal residue - valleculae Pharyngeal -- Pharyngeal- Mechanical Soft Reduced tongue base retraction;Pharyngeal residue - valleculae Pharyngeal -- Pharyngeal- Regular -- Pharyngeal -- Pharyngeal- Multi-consistency -- Pharyngeal -- Pharyngeal- Pill Reduced tongue base retraction Pharyngeal -- Pharyngeal Comment --  CHL IP CERVICAL ESOPHAGEAL PHASE 11/21/2017 Cervical Esophageal Phase WFL Pudding Teaspoon -- Pudding Cup -- Honey Teaspoon -- Honey Cup -- Nectar Teaspoon -- Nectar Cup -- Nectar Straw -- Thin Teaspoon -- Thin Cup -- Thin Straw -- Puree -- Mechanical Soft -- Regular -- Multi-consistency -- Pill -- Cervical Esophageal Comment -- No flowsheet data found. [name], Laura 11/21/2017, 2:20 PM  [name], M.A. CCC-SLP [phone]              Labs: BMET Recent Labs  Lab 11/18/17 0341 11/18/17 1602 11/19/17 0410 11/19/17 1611 11/20/17 0437 11/20/17 1528 11/22/17 0430  NA 139 139 139 139 138 138 139  K 4.1 4.1 4.2 4.0 4.5 4.7 4.2  CL 102 104 104 103 103 103 104  CO2 25 23 25 25 25 24 24   GLUCOSE 198* 209* 248* 123* 125* 138* 138*  BUN 13 11 9 8 6  5* 13  CREATININE 2.75* 2.11* 1.73* 1.59* 1.46* 1.46* 3.54*  CALCIUM 8.6* 8.4* 8.5* 8.3* 8.2* 8.4* 8.6*  PHOS 1.9* 2.3*  2.3* 1.8* 2.3* 2.3* 2.3* 3.6   CBC Recent Labs  Lab 11/16/17 0401  11/18/17 0341 11/19/17 0410 11/20/17 0636 11/21/17 0429 11/22/17 0430  WBC 20.2*   < > 22.3* 20.7* 20.7* 22.2* 16.9*  NEUTROABS 16.4*  --  18.3*  --   --   --  13.3*  HGB 9.4*   < > 9.8* 9.6* 9.4* 8.7* 8.6*  HCT 30.5*   < > 32.3* 32.0* 30.3* 29.8* 28.9*  MCV 117.3*   < > 118.3* 119.0* 120.7* 122.6* 122.5*  PLT 60*   < > 16* 13* 20* 35* 36*   < > = values in this interval not displayed.    Medications:    . chlorhexidine  15 mL Mouth Rinse BID  . Chlorhexidine Gluconate Cloth  6 each Topical Daily  . famotidine  20  mg Oral QHS  . folic acid  1 mg Oral Daily  . insulin aspart  0-15 Units Subcutaneous Q4H  . ipratropium-albuterol  3 mL Nebulization TID  . lactulose  20 g Per Tube TID   Or  . lactulose  300 mL Rectal TID  . mouth rinse  15 mL Mouth Rinse q12n4p  . methocarbamol  500 mg Oral TID  . midodrine  10 mg Oral TID WC  . octreotide  200 mcg Subcutaneous BID  . QUEtiapine  25 mg Oral QHS  . rifaximin  550 mg Oral BID  . sodium chloride flush  10-40 mL Intracatheter Q12H  . thiamine  100 mg Oral Daily      Paulene FloorJames , MD 11/22/2017, 8:18 AM

## 2017-11-23 DIAGNOSIS — Z992 Dependence on renal dialysis: Secondary | ICD-10-CM | POA: Diagnosis not present

## 2017-11-23 DIAGNOSIS — N186 End stage renal disease: Secondary | ICD-10-CM | POA: Diagnosis not present

## 2017-11-23 DIAGNOSIS — N170 Acute kidney failure with tubular necrosis: Secondary | ICD-10-CM | POA: Diagnosis not present

## 2017-11-23 LAB — GLUCOSE, CAPILLARY
Glucose-Capillary: 188 mg/dL — ABNORMAL HIGH (ref 65–99)
Glucose-Capillary: 238 mg/dL — ABNORMAL HIGH (ref 65–99)
Glucose-Capillary: 296 mg/dL — ABNORMAL HIGH (ref 65–99)
Glucose-Capillary: 220 mg/dL — ABNORMAL HIGH (ref 65–99)

## 2017-11-23 LAB — CBC WITH DIFFERENTIAL/PLATELET
Basophils Absolute: 0.1 10*3/uL (ref 0.0–0.1)
Basophils Relative: 1 %
EOS PCT: 1 %
Eosinophils Absolute: 0.1 10*3/uL (ref 0.0–0.7)
HEMATOCRIT: 28.8 % — AB (ref 39.0–52.0)
HEMOGLOBIN: 8.4 g/dL — AB (ref 13.0–17.0)
LYMPHS PCT: 13 %
Lymphs Abs: 1.6 10*3/uL (ref 0.7–4.0)
MCH: 35.6 pg — ABNORMAL HIGH (ref 26.0–34.0)
MCHC: 29.2 g/dL — ABNORMAL LOW (ref 30.0–36.0)
MCV: 122 fL — ABNORMAL HIGH (ref 78.0–100.0)
MONOS PCT: 9 %
Monocytes Absolute: 1.1 10*3/uL — ABNORMAL HIGH (ref 0.1–1.0)
NEUTROS PCT: 76 %
Neutro Abs: 9.5 10*3/uL — ABNORMAL HIGH (ref 1.7–7.7)
Platelets: 39 10*3/uL — ABNORMAL LOW (ref 150–400)
RBC: 2.36 MIL/uL — AB (ref 4.22–5.81)
RDW: 17.1 % — ABNORMAL HIGH (ref 11.5–15.5)
Smear Review: DECREASED
WBC: 12.4 10*3/uL — AB (ref 4.0–10.5)

## 2017-11-23 LAB — COMPREHENSIVE METABOLIC PANEL
ALK PHOS: 141 U/L — AB (ref 38–126)
ALT: 37 U/L (ref 17–63)
ANION GAP: 18 — AB (ref 5–15)
AST: 70 U/L — ABNORMAL HIGH (ref 15–41)
Albumin: 3.4 g/dL — ABNORMAL LOW (ref 3.5–5.0)
BILIRUBIN TOTAL: 11.6 mg/dL — AB (ref 0.3–1.2)
BUN: 22 mg/dL — ABNORMAL HIGH (ref 6–20)
CALCIUM: 9 mg/dL (ref 8.9–10.3)
CO2: 19 mmol/L — ABNORMAL LOW (ref 22–32)
CREATININE: 5.68 mg/dL — AB (ref 0.61–1.24)
Chloride: 104 mmol/L (ref 101–111)
GFR, EST AFRICAN AMERICAN: 12 mL/min — AB (ref >60)
GFR, EST NON AFRICAN AMERICAN: 10 mL/min — AB (ref >60)
Glucose, Bld: 296 mg/dL — ABNORMAL HIGH (ref 65–99)
Potassium: 4.2 mmol/L (ref 3.5–5.1)
Sodium: 141 mmol/L (ref 135–145)
TOTAL PROTEIN: 6.8 g/dL (ref 6.5–8.1)

## 2017-11-23 LAB — PHOSPHORUS: PHOSPHORUS: 5.2 mg/dL — AB (ref 2.5–4.6)

## 2017-11-23 LAB — MAGNESIUM: Magnesium: 2.8 mg/dL — ABNORMAL HIGH (ref 1.7–2.4)

## 2017-11-23 MED ORDER — LIDOCAINE 5 % EX PTCH
1.0000 | MEDICATED_PATCH | CUTANEOUS | Status: DC
  Administered 2017-11-23 – 2017-12-12 (×18): 1 via TRANSDERMAL
  Filled 2017-11-23 (×18): qty 1

## 2017-11-23 MED ORDER — INSULIN ASPART 100 UNIT/ML ~~LOC~~ SOLN
0.0000 [IU] | Freq: Three times a day (TID) | SUBCUTANEOUS | Status: DC
  Administered 2017-11-23: 3 [IU] via SUBCUTANEOUS
  Administered 2017-11-23 (×2): 5 [IU] via SUBCUTANEOUS
  Administered 2017-11-23: 8 [IU] via SUBCUTANEOUS
  Administered 2017-11-24 (×3): 3 [IU] via SUBCUTANEOUS
  Administered 2017-11-25: 5 [IU] via SUBCUTANEOUS
  Administered 2017-11-25: 3 [IU] via SUBCUTANEOUS
  Administered 2017-11-25: 8 [IU] via SUBCUTANEOUS
  Administered 2017-11-26: 5 [IU] via SUBCUTANEOUS
  Administered 2017-11-26 – 2017-11-27 (×3): 3 [IU] via SUBCUTANEOUS
  Administered 2017-11-27: 2 [IU] via SUBCUTANEOUS
  Administered 2017-11-27: 3 [IU] via SUBCUTANEOUS
  Administered 2017-11-28: 8 [IU] via SUBCUTANEOUS
  Administered 2017-11-28: 5 [IU] via SUBCUTANEOUS
  Administered 2017-11-29: 8 [IU] via SUBCUTANEOUS
  Administered 2017-11-29: 5 [IU] via SUBCUTANEOUS
  Administered 2017-11-29: 3 [IU] via SUBCUTANEOUS
  Administered 2017-11-29: 11 [IU] via SUBCUTANEOUS
  Administered 2017-11-30: 3 [IU] via SUBCUTANEOUS
  Administered 2017-11-30: 15 [IU] via SUBCUTANEOUS
  Administered 2017-11-30: 8 [IU] via SUBCUTANEOUS
  Administered 2017-11-30: 3 [IU] via SUBCUTANEOUS
  Administered 2017-12-01: 2 [IU] via SUBCUTANEOUS
  Administered 2017-12-01: 15 [IU] via SUBCUTANEOUS
  Administered 2017-12-01: 8 [IU] via SUBCUTANEOUS
  Administered 2017-12-02: 5 [IU] via SUBCUTANEOUS
  Administered 2017-12-02: 15 [IU] via SUBCUTANEOUS

## 2017-11-23 MED ORDER — NEPRO/CARBSTEADY PO LIQD
237.0000 mL | Freq: Two times a day (BID) | ORAL | Status: DC
  Administered 2017-11-23 – 2017-11-24 (×2): 237 mL via ORAL
  Filled 2017-11-23 (×9): qty 237

## 2017-11-23 NOTE — Progress Notes (Signed)
Progress Note    [name]  WGN:[id number] DOB: 06/07/1961  DOA: 11/07/2017 PCP: Dettinger, Elige RadonJoshua A, MD    Brief Narrative:   Chief complaint: Follow-up decompensated cirrhosis/hepatic encephalopathy  Medical records reviewed and are as summarized below:  [name] is an 57 y.o. male with a PMH of chronic back pain ([place] which [place] [place] recently) secondary to compression fractures, diabetes, PTSD, and alcoholism who was admitted 11/07/17 for evaluation of jaundice and somnolence. He was initially admitted to the critical care team and his care was provided by that service and [facility] up until 11/23/17, [facility] which [facility].  An extensive review of his hospital record was performed and is summarized below: 11/07/17: Admitted to PCCM. Hypotensive and hypoxic. Placed on empiric ceftriaxone and lactulose. Intubated. 11/08/17: Given vitamin K for coagulopathy. Vancomycin added for severe cellulitis to his lower extremities. Placed on IV dextrose for recurrent hypoglycemia. Bronchodilators started for wheezing. GI consulted. MELD 26. 11/09/17: Tube feeding initiated. No urine output and possible hepatorenal syndrome entertained. Wound care nurse evaluates lower extremity wounds. 11/10/17: Blood pressure improved. Plan for SBT 11/11/17. 11/11/17: Extubated. AKI noted, suspected to be from ATN. GI adds rifaximin to regimen given ongoing encephalopathy. Vancomycin discontinued. Nephrologist consulted. Feels AKI is hemodynamically mediated. Orders urine eosinophils and culture. 11/12/17: Diet advanced to dysphagia to status post speech therapy evaluation. Patient remains anorectic and creatinine 5.12. Thought to be ready for stepdown unit. 11/13/17: Creatinine 5.81. Worsening acidosis noted. Complete course of Rocephin. 11/14/17: Creatinine 6.8. Nephrology considering dialysis. Trial of midodrine, octreotide and albumin recommended. [facility]  [facility] Evaluated by PT, SNF recommended. Palliative care team consulted. Noted to become more agitated and delirious.  11/15/17: Creatinine 7.7. Evaluated by palliative care. Full scope of treatment/full CODE STATUS recommended. Plan to initiate HD if creatinine continues to worsen. Chest x-ray shows possible pneumonia. Zosyn started for aspiration pneumonia. 11/16/17: CRRT initiated. PCCM asked to see patient due to worsening encephalopathy and renal failure. Reintubated. 11/17/17: Platelets noted to be 16,000. 11/18/17: No sign of renal recovery but noted to be tolerating CVVHD. Renal recommends withdrawing CRRT if no signs of improvement within 2 weeks. Heart rate noted 38-42. 11/19/17: Remains on Precedex. Continues on midodrine/octreotide. Extubated. 11/20/17: Now a limited code (no CPR or intubation, pressors only). Off Precedex and dopamine. Placed on scheduled opiates. 11/21/17: Remains anuric with no recovery of renal function. CVVHD off. 11/22/17: Not a candidate for long-term dialysis. [facility] 11/23/17: [facility] assumes care.   Assessment/Plan:   Principal problem:   Acute encephalopathy secondary to decompensated alcoholic cirrhosis/alcohol withdrawal complicated by acute renal failure requiring CVVHD and acute respiratory failure status post intubation 2 Patient has required Precedex and has had a prolonged hospital stay as summarized above. Acute encephalopathy appear to be largely resolved at this point. He is sitting up in a chair interacting with his family upon my visit. Remains on Seroquel at bedtime. Discontinue telemetry.  Active problems:   Acute tubular necrosis with acute renal failure Appears to be the primary driver for ongoing hospitalization. His renal function has deteriorated and the nephrologists continue to monitor for reinitiation of CVVHD versus iHD. Remains on albumin, octreotide and midodrine to try and improve renal perfusion.    Acute  respiratory failure requiring mechanical ventilation/Hypoxia in the setting of aspiration pneumonia Resolved. Patient appears to be recovering from aspiration pneumonia. He has completed  a course of Zosyn.    Decompensation of cirrhosis of liver (HCC) Continue lactulose and rifaximin.    Chronic back pain Addressed by the palliative care team. On oxycodone and Robaxin.    Macrocytic anemia   Family Communication/Anticipated D/C date and plan/Code Status   DVT prophylaxis: Lovenox ordered. Code Status: Partial code: Pressors only, no CPR or mechanical ventilation.  Family Communication: Father and stepmother updated at the bedside. Disposition Plan: PT/OT evaluations to determine.   Medical Consultants:    Pulmonology  Nephrology  Gastroenterology  Palliative Care   Anti-Infectives:   Ceftriaxone 3/16 >>3.22 vancomycin 3/17 >>3/18 Zosyn 3/24 >> 3/30  Subjective:   Patient is sitting up, reports abdominal bloating and chronic back pain. Does not feel tremulous. Has rectal fullness with a rectal tube in place draining liquid stools. No shortness breath but occasionally has a moist cough.  Objective:    Vitals:   11/22/17 1200 11/22/17 1254 11/22/17 2232 11/23/17 0500  BP: (!) 115/46 (!) 94/55 (!) 91/58 (!) 97/50  Pulse: 80 88 69 69  Resp: 19 18 17 19   Temp:  98.4 F (36.9 C) 98.1 F (36.7 C) 98.3 F (36.8 C)  TempSrc:  Oral Oral Oral  SpO2: 95% 93% 97% 95%  Weight:  83 kg (182 lb 15.7 oz)  83 kg (182 lb 15.7 oz)  Height:  5\' 6"  (1.676 m)      Intake/Output Summary (Last 24 hours) at 11/23/2017 0939 Last data filed at 11/23/2017 0600 Gross per 24 hour  Intake 30 ml  Output 400 ml  Net -370 ml   Filed Weights   11/22/17 0444 11/22/17 1254 11/23/17 0500  Weight: 80.3 kg (177 lb 0.5 oz) 83 kg (182 lb 15.7 oz) 83 kg (182 lb 15.7 oz)    Exam: General: Chronically ill-appearing male in no acute distress. Cardiovascular: Heart sounds show a regular rate,  and rhythm. No gallops or rubs. No murmurs. No JVD. Lungs: Diminished with bibasilar crackles.  Abdomen: Distended/bloated. No masses. No hepatosplenomegaly. Skin: Warm and dry. No rashes or lesions. Extremities: No clubbing or cyanosis. 3+ doughy pitting edema with some chronic lower extremity venous ulcers and venous stasis changes. Pedal pulses 2+. Psychiatric: Mood and affect are normal. Insight and judgment are impaired.  Data Reviewed:   I have personally reviewed following labs and imaging studies:  Labs: Labs show the following:   Basic Metabolic Panel: Recent Labs  Lab 11/18/17 1602 11/19/17 0410 11/19/17 1611 11/20/17 0437 11/20/17 1528 11/21/17 0429 11/22/17 0430  NA 139 139 139 138 138  --  139  K 4.1 4.2 4.0 4.5 4.7  --  4.2  CL 104 104 103 103 103  --  104  CO2 23 25 25 25 24   --  24  GLUCOSE 209* 248* 123* 125* 138*  --  138*  BUN 11 9 8 6  5*  --  13  CREATININE 2.11* 1.73* 1.59* 1.46* 1.46*  --  3.54*  CALCIUM 8.4* 8.5* 8.3* 8.2* 8.4*  --  8.6*  MG 2.5* 2.4  --  2.4  --  2.3 2.6*  PHOS 2.3*  2.3* 1.8* 2.3* 2.3* 2.3*  --  3.6   GFR Estimated Creatinine Clearance: 23.8 mL/min (A) (by C-G formula based on SCr of 3.54 mg/dL (H)). Liver Function Tests: Recent Labs  Lab 11/17/17 0212  11/18/17 0341  11/19/17 0410 11/19/17 1611 11/20/17 0437 11/20/17 1528 11/22/17 0430  AST 91*  --  80*  --  77*  --   --   --  86*  ALT 40  --  36  --  33  --   --   --  38  ALKPHOS 192*  --  170*  --  158*  --   --   --  144*  BILITOT 12.1*  --  12.3*  --  11.9*  --   --   --  13.2*  PROT 6.8  --  6.5  --  6.7  --   --   --  6.4*  ALBUMIN 3.4*   < > 3.3*   < > 3.2* 2.8* 2.7* 2.9* 2.9*   < > = values in this interval not displayed.    Recent Labs  Lab 11/18/17 0345 11/19/17 0410 11/20/17 0438 11/21/17 0427 11/22/17 0430  AMMONIA 54* 44* 48* 45* 51*   Coagulation profile Recent Labs  Lab 11/18/17 0341 11/19/17 0410 11/20/17 0437 11/21/17 0429  11/22/17 0945  INR 1.54 1.48 1.45 1.40 1.45    CBC: Recent Labs  Lab 11/18/17 0341 11/19/17 0410 11/20/17 0636 11/21/17 0429 11/22/17 0430  WBC 22.3* 20.7* 20.7* 22.2* 16.9*  NEUTROABS 18.3*  --   --   --  13.3*  HGB 9.8* 9.6* 9.4* 8.7* 8.6*  HCT 32.3* 32.0* 30.3* 29.8* 28.9*  MCV 118.3* 119.0* 120.7* 122.6* 122.5*  PLT 16* 13* 20* 35* 36*   CBG: Recent Labs  Lab 11/22/17 1139 11/22/17 1628 11/22/17 1734 11/22/17 2212 11/23/17 0755  GLUCAP 89 138* 132* 134* 188*   Sepsis Labs: Recent Labs  Lab 11/19/17 0410 11/19/17 1110 11/20/17 0437 11/20/17 0636 11/21/17 0429 11/22/17 0430  PROCALCITON  --  4.06 2.88  --  3.17  --   WBC 20.7*  --   --  20.7* 22.2* 16.9*  LATICACIDVEN  --   --  1.3  --   --  1.3    Microbiology Recent Results (from the past 240 hour(s))  Culture, blood (routine x 2)     Status: None   Collection Time: 11/15/17  1:57 PM  Result Value Ref Range Status   Specimen Description BLOOD LEFT HAND  Final   Special Requests   Final    BOTTLES DRAWN AEROBIC ONLY Blood Culture adequate volume   Culture   Final    NO GROWTH 5 DAYS Performed at [facility], [address] [place], [place] [address]    Report Status 11/20/2017 FINAL  Final  Culture, blood (routine x 2)     Status: None   Collection Time: 11/15/17  2:37 PM  Result Value Ref Range Status   Specimen Description BLOOD LEFT HAND  Final   Special Requests IN PEDIATRIC BOTTLE Blood Culture adequate volume  Final   Culture   Final    NO GROWTH 5 DAYS Performed at [facility], [address], [place], [place] [address]    Report Status 11/20/2017 FINAL  Final    Procedures and diagnostic studies:  Dg Chest Port 1 View  Result Date: 11/22/2017 CLINICAL DATA:  Respiratory failure EXAM: PORTABLE CHEST 1 VIEW COMPARISON:  11/16/2017 and prior exams FINDINGS: Cardiomediastinal silhouette is unchanged. An endotracheal tube and NG tube have been removed. A RIGHT IJ central  venous catheter with tip overlying the mid SVC again noted. Mild bilateral interstitial opacities and LEFT basilar atelectasis again noted. There is no evidence of pneumothorax. IMPRESSION: Endotracheal tube and NG tube removal. Otherwise unchanged appearance of the chest. Electronically Signed   By: Harmon Pier M.D.   On:  11/22/2017 07:17   Dg Swallowing Func-speech Pathology  Result Date: 11/21/2017 Objective Swallowing Evaluation: Type of Study: MBS-Modified Barium Swallow Study  Patient Details Name: JAEVON PARAS MRN: [id number] Date of Birth: 03-Nov-1960 Today's Date: 11/21/2017 Time: SLP Start Time (ACUTE ONLY): 1222 -SLP Stop Time (ACUTE ONLY): 1233 SLP Time Calculation (min) (ACUTE ONLY): 11 min Past Medical History: Past Medical History: Diagnosis Date . Chronic lower back pain  . Compression fracture   lumbar 3 . Diabetes mellitus  . Fracture acetabulum-closed (HCC) 04/09/2013 . GERD (gastroesophageal reflux disease)  . H/O Legionnaire's disease  . Hepatomegaly  . Migraine  . Osteoporosis  . Pancreatitis  . PTSD (post-traumatic stress disorder)  . Vitamin D deficiency  Past Surgical History: Past Surgical History: Procedure Laterality Date . BACK SURGERY   . CHOLECYSTECTOMY   . HERNIA REPAIR   . KYPHOPLASTY N/A 07/16/2015  Procedure: Lumbar three kyphoplasty;  Surgeon: Coletta Memos, MD;  Location: [facility];  Service: Neurosurgery;  Laterality: N/A;  Lumbar three kyphoplasty . LAMINECTOMY   . MASS EXCISION Left 06/12/2017  Procedure: EXCISION LEFT AXILLARY SEBACEOUS CYST;  Surgeon: Abigail Miyamoto, MD;  Location: [facility];  Service: General;  Laterality: Left; . STERNUM FRACTURE SURGERY   HPI: Pt is a 57 yo male who presented with decompensated cirrhosis with hepatic encephalopathy. PMHx includes DM, PTSD, compression fx's and pain/ debilitation, GERD, EtOH abuse with cirrhosis. Pt recently hospitalized in Feb 2019 for flu. Previously seen by SLP in 2016 following intubation, with  FEES revealing reversible dysphagia and pt was eventually d/c from SLP on regular solid and thin liquid diet. Intubated 3/17-3/20 and re-intubated 3/25-3/28. Pt currently on CRRT and nasal canula  Subjective: alert, eager for POs Assessment / Plan / Recommendation CHL IP CLINICAL IMPRESSIONS 11/21/2017 Clinical Impression Pt has a mild oropharyngeal dysphagia that is likely multifactorial given recent intubations and generalized deconditioning, also impacted by his altered mentation although this does seem to be improving. Pt has mildly decreased bolus cohesion, allowing thin liquids to spill prematurely as far as the pyriform sinuses before triggering a swallow. He maintains good airway protection even across challenging with intermittent penetration that clears the laryngeal vestibule upon completion of the swallow. No aspiration is observed. Pt also has reduced base of tongue retraction that leaves mild vallecular residue after the swallow. Given presence of residue and considering mentation that has been fluctuating, recommend starting with Dys 3 diet and thin liquids. SLP will follow for tolerance and potential to advance. SLP Visit Diagnosis Dysphagia, oropharyngeal phase (R13.12) Attention and concentration deficit following -- Frontal lobe and executive function deficit following -- Impact on safety and function Mild aspiration risk   CHL IP TREATMENT RECOMMENDATION 11/21/2017 Treatment Recommendations Therapy as outlined in treatment plan below   Prognosis 11/21/2017 Prognosis for Safe Diet Advancement Good Barriers to Reach Goals -- Barriers/Prognosis Comment -- CHL IP DIET RECOMMENDATION 11/21/2017 SLP Diet Recommendations Dysphagia 3 (Mech soft) solids;Thin liquid Liquid Administration via Cup;Straw Medication Administration Whole meds with liquid Compensations Minimize environmental distractions;Slow rate;Small sips/bites Postural Changes Seated upright at 90 degrees   CHL IP OTHER RECOMMENDATIONS 11/21/2017  Recommended Consults -- Oral Care Recommendations Oral care BID Other Recommendations --   CHL IP FOLLOW UP RECOMMENDATIONS 11/21/2017 Follow up Recommendations None   CHL IP FREQUENCY AND DURATION 11/21/2017 Speech Therapy Frequency (ACUTE ONLY) min 2x/week Treatment Duration 2 weeks      CHL IP ORAL PHASE 11/21/2017 Oral Phase Impaired Oral - Pudding Teaspoon -- Oral -  Pudding Cup -- Oral - Honey Teaspoon -- Oral - Honey Cup -- Oral - Nectar Teaspoon -- Oral - Nectar Cup -- Oral - Nectar Straw -- Oral - Thin Teaspoon -- Oral - Thin Cup Decreased bolus cohesion;Premature spillage Oral - Thin Straw Decreased bolus cohesion;Premature spillage Oral - Puree WFL Oral - Mech Soft WFL Oral - Regular -- Oral - Multi-Consistency -- Oral - Pill WFL Oral Phase - Comment --  CHL IP PHARYNGEAL PHASE 11/21/2017 Pharyngeal Phase Impaired Pharyngeal- Pudding Teaspoon -- Pharyngeal -- Pharyngeal- Pudding Cup -- Pharyngeal -- Pharyngeal- Honey Teaspoon -- Pharyngeal -- Pharyngeal- Honey Cup -- Pharyngeal -- Pharyngeal- Nectar Teaspoon -- Pharyngeal -- Pharyngeal- Nectar Cup -- Pharyngeal -- Pharyngeal- Nectar Straw -- Pharyngeal -- Pharyngeal- Thin Teaspoon -- Pharyngeal -- Pharyngeal- Thin Cup Reduced tongue base retraction;Pharyngeal residue - valleculae;Penetration/Aspiration during swallow Pharyngeal Material enters airway, remains ABOVE vocal cords then ejected out Pharyngeal- Thin Straw Reduced tongue base retraction;Pharyngeal residue - valleculae;Penetration/Aspiration during swallow Pharyngeal Material enters airway, remains ABOVE vocal cords then ejected out Pharyngeal- Puree Reduced tongue base retraction;Pharyngeal residue - valleculae Pharyngeal -- Pharyngeal- Mechanical Soft Reduced tongue base retraction;Pharyngeal residue - valleculae Pharyngeal -- Pharyngeal- Regular -- Pharyngeal -- Pharyngeal- Multi-consistency -- Pharyngeal -- Pharyngeal- Pill Reduced tongue base retraction Pharyngeal -- Pharyngeal Comment --  CHL IP  CERVICAL ESOPHAGEAL PHASE 11/21/2017 Cervical Esophageal Phase WFL Pudding Teaspoon -- Pudding Cup -- Honey Teaspoon -- Honey Cup -- Nectar Teaspoon -- Nectar Cup -- Nectar Straw -- Thin Teaspoon -- Thin Cup -- Thin Straw -- Puree -- Mechanical Soft -- Regular -- Multi-consistency -- Pill -- Cervical Esophageal Comment -- No flowsheet data found. Maxcine Ham 11/21/2017, 2:20 PM  Maxcine Ham, M.A. CCC-SLP [phone]              Medications:   . chlorhexidine  15 mL Mouth Rinse BID  . Chlorhexidine Gluconate Cloth  6 each Topical Daily  . famotidine  20 mg Oral QHS  . folic acid  1 mg Oral Daily  . insulin aspart  0-15 Units Subcutaneous TID AC & HS  . lactulose  20 g Per Tube TID   Or  . lactulose  300 mL Rectal TID  . mouth rinse  15 mL Mouth Rinse q12n4p  . methocarbamol  500 mg Oral TID  . midodrine  10 mg Oral TID WC  . octreotide  200 mcg Subcutaneous BID  . QUEtiapine  25 mg Oral QHS  . rifaximin  550 mg Oral BID  . sodium chloride flush  10-40 mL Intracatheter Q12H  . thiamine  100 mg Oral Daily   Continuous Infusions: . sodium chloride Stopped (11/20/17 0900)  . albumin human       LOS: 16 days   Hillery Aldo  [facility] Pager [phone]. If unable to reach me by pager, please call my cell phone at [phone].  *Please refer to [email or web address], password [email or web address] to get updated schedule on who will round on this patient, as hospitalists switch teams weekly. If 7PM-7AM, please contact night-coverage at [email or web address], password [email or web address] for any overnight needs.  11/23/2017, 9:39 AM

## 2017-11-23 NOTE — Progress Notes (Signed)
S: Only issue is that he feels "bloated" O:BP (!) 97/50   Pulse 69   Temp 98.3 F (36.8 C) (Oral)   Resp 19   Ht 5\' 6"  (1.676 m)   Wt 83 kg (182 lb 15.7 oz)   SpO2 95%   BMI 29.53 kg/m   Intake/Output Summary (Last 24 hours) at 11/23/2017 1335 Last data filed at 11/23/2017 0600 Gross per 24 hour  Intake 30 ml  Output 0 ml  Net 30 ml   Intake/Output: I/O last 3 completed shifts: In: 153.5 [P.O.:30; I.V.:30; IV Piggyback:93.5] Out: 1700 [Stool:1700]  Intake/Output this shift:  No intake/output data recorded. Weight change: 2.7 kg (5 lb 15.2 oz) Gen:WD WM with jaundice but in NAD CVS: no rub Resp: decreased BS at bases Abd: distended, tense Ext: 1+ edema  Recent Labs  Lab 11/17/17 0212  11/18/17 0341 11/18/17 1602 11/19/17 0410 11/19/17 1611 11/20/17 0437 11/20/17 1528 11/22/17 0430  NA 140   < > 139 139 139 139 138 138 139  K 4.1   < > 4.1 4.1 4.2 4.0 4.5 4.7 4.2  CL 104   < > 102 104 104 103 103 103 104  CO2 22   < > 25 23 25 25 25 24 24   GLUCOSE 109*   < > 198* 209* 248* 123* 125* 138* 138*  BUN 36*   < > 13 11 9 8 6  5* 13  CREATININE 5.90*   < > 2.75* 2.11* 1.73* 1.59* 1.46* 1.46* 3.54*  ALBUMIN 3.4*   < > 3.3* 3.3* 3.2* 2.8* 2.7* 2.9* 2.9*  CALCIUM 8.9   < > 8.6* 8.4* 8.5* 8.3* 8.2* 8.4* 8.6*  PHOS  --    < > 1.9* 2.3*  2.3* 1.8* 2.3* 2.3* 2.3* 3.6  AST 91*  --  80*  --  77*  --   --   --  86*  ALT 40  --  36  --  33  --   --   --  38   < > = values in this interval not displayed.   Liver Function Tests: Recent Labs  Lab 11/18/17 0341  11/19/17 0410  11/20/17 0437 11/20/17 1528 11/22/17 0430  AST 80*  --  77*  --   --   --  86*  ALT 36  --  33  --   --   --  38  ALKPHOS 170*  --  158*  --   --   --  144*  BILITOT 12.3*  --  11.9*  --   --   --  13.2*  PROT 6.5  --  6.7  --   --   --  6.4*  ALBUMIN 3.3*   < > 3.2*   < > 2.7* 2.9* 2.9*   < > = values in this interval not displayed.   No results for input(s): LIPASE, AMYLASE in the last 168  hours. Recent Labs  Lab 11/20/17 0438 11/21/17 0427 11/22/17 0430  AMMONIA 48* 45* 51*   CBC: Recent Labs  Lab 11/18/17 0341 11/19/17 0410 11/20/17 0636 11/21/17 0429 11/22/17 0430  WBC 22.3* 20.7* 20.7* 22.2* 16.9*  NEUTROABS 18.3*  --   --   --  13.3*  HGB 9.8* 9.6* 9.4* 8.7* 8.6*  HCT 32.3* 32.0* 30.3* 29.8* 28.9*  MCV 118.3* 119.0* 120.7* 122.6* 122.5*  PLT 16* 13* 20* 35* 36*   Cardiac Enzymes: No results for input(s): CKTOTAL, CKMB, CKMBINDEX, TROPONINI in the last 168  hours. CBG: Recent Labs  Lab 11/22/17 1628 11/22/17 1734 11/22/17 2212 11/23/17 0755 11/23/17 1223  GLUCAP 138* 132* 134* 188* 238*    Iron Studies: No results for input(s): IRON, TIBC, TRANSFERRIN, FERRITIN in the last 72 hours. Studies/Results: Dg Chest Port 1 View  Result Date: 11/22/2017 CLINICAL DATA:  Respiratory failure EXAM: PORTABLE CHEST 1 VIEW COMPARISON:  11/16/2017 and prior exams FINDINGS: Cardiomediastinal silhouette is unchanged. An endotracheal tube and NG tube have been removed. A RIGHT IJ central venous catheter with tip overlying the mid SVC again noted. Mild bilateral interstitial opacities and LEFT basilar atelectasis again noted. There is no evidence of pneumothorax. IMPRESSION: Endotracheal tube and NG tube removal. Otherwise unchanged appearance of the chest. Electronically Signed   By: Harmon Pier M.D.   On: 11/22/2017 07:17   . chlorhexidine  15 mL Mouth Rinse BID  . Chlorhexidine Gluconate Cloth  6 each Topical Daily  . famotidine  20 mg Oral QHS  . folic acid  1 mg Oral Daily  . insulin aspart  0-15 Units Subcutaneous TID AC & HS  . lactulose  20 g Per Tube TID   Or  . lactulose  300 mL Rectal TID  . mouth rinse  15 mL Mouth Rinse q12n4p  . methocarbamol  500 mg Oral TID  . midodrine  10 mg Oral TID WC  . octreotide  200 mcg Subcutaneous BID  . QUEtiapine  25 mg Oral QHS  . rifaximin  550 mg Oral BID  . sodium chloride flush  10-40 mL Intracatheter Q12H  .  thiamine  100 mg Oral Daily    BMET    Component Value Date/Time   NA 139 11/22/2017 0430   NA 137 10/22/2017 1542   K 4.2 11/22/2017 0430   CL 104 11/22/2017 0430   CO2 24 11/22/2017 0430   GLUCOSE 138 (H) 11/22/2017 0430   BUN 13 11/22/2017 0430   BUN 5 (L) 10/22/2017 1542   CREATININE 3.54 (H) 11/22/2017 0430   CALCIUM 8.6 (L) 11/22/2017 0430   CALCIUM (LL) 04/07/2007 1517    5.5 Result repeated and verified. CRITICAL RESULT CALLED TO, READ BACK BY AND VERIFIED WITH: HUDGSON,M. RN 04/07/07 1031 WAYK CORRECTED ON 08/15 AT 0024: PREVIOUSLY REPORTED AS 5.5 Result repeated and verified.   GFRNONAA 18 (L) 11/22/2017 0430   GFRAA 21 (L) 11/22/2017 0430   CBC    Component Value Date/Time   WBC 16.9 (H) 11/22/2017 0430   RBC 2.36 (L) 11/22/2017 0430   HGB 8.6 (L) 11/22/2017 0430   HGB 13.1 10/22/2017 1542   HCT 28.9 (L) 11/22/2017 0430   HCT 40.3 10/22/2017 1542   PLT 36 (L) 11/22/2017 0430   PLT 107 (L) 10/22/2017 1542   MCV 122.5 (H) 11/22/2017 0430   MCV 110 (H) 10/22/2017 1542   MCH 36.4 (H) 11/22/2017 0430   MCHC 29.8 (L) 11/22/2017 0430   RDW 17.8 (H) 11/22/2017 0430   RDW 15.1 10/22/2017 1542   LYMPHSABS 2.0 11/22/2017 0430   LYMPHSABS 1.4 10/22/2017 1542   MONOABS 1.2 (H) 11/22/2017 0430   EOSABS 0.2 11/22/2017 0430   EOSABS 0.0 10/22/2017 1542   BASOSABS 0.2 (H) 11/22/2017 0430   BASOSABS 0.1 10/22/2017 1542   Brief HPI:  57 year old man with a history of diabetes Parkinson compression fractures, CAD, and alcohol abuse. He continues to drink 1 bottle of wine daily.He presented to the ED with 2 weeks of increasing edema abdominal swelling and lethargy. Hypoglycemic due to  ongoing insulin use.  Intubated for airway protection with hypotension. Treated empirically for presumed SBP although quantity of ascites was notsufficient to tap.  He developed worsening renal failure and started on CVVHD 3/26-3/29/19.  He has been extubated.  Assessment/Plan:  1. AKI-  in setting of decompensated cirrhosis, hypotension, presumably due to SBP and started on CVVHD 3/26-3/29/19.  Also had diarrhea and has been negative with I's/O's by 5 liters since admission.  No significant UOP to date.  Hopefully this is related to ischemic ATN, although hepatorenal remains on the DDx.  He is not a candidate for liver transplant due to ongoing alcohol consumption at the time of admission.  Currently on midodrine and octreotide.  Hold off on HD for now but may need it tomorrow.  If this is hepatorenal, will need to get palliative care involved as he will not do well. Not sure he would be able to tolerate intermittent HD with his low BP despite midodrine. 2. Alcoholic cirrhosis with encephalopathy- also with rising bilirubin.  On lactulose 3. Alcohol abuse- will need rehab and ongoing treatment to prevent withdrawal.   4. Disposition- prognosis remains poor and recommend palliative care to help set goals/limits of care.  Irena Cords, MD [employer] [phone]

## 2017-11-23 NOTE — Progress Notes (Signed)
[facility] PROGRESS NOTE Subjective Patient is relatively stable regarding his liver.  His main problem at this time is ATN versus HRS and he has been followed by renal.  He has had CRRT and is being considered for HD.  Objective: Vital signs in last 24 hours: Temp:  [98.1 F (36.7 C)-98.6 F (37 C)] 98.3 F (36.8 C) (04/01 0500) Pulse Rate:  [69-88] 69 (04/01 0500) Resp:  [12-19] 19 (04/01 0500) BP: (91-115)/(46-72) 97/50 (04/01 0500) SpO2:  [92 %-97 %] 95 % (04/01 0500) Weight:  [83 kg (182 lb 15.7 oz)] 83 kg (182 lb 15.7 oz) (04/01 0500) Last BM Date: 11/22/17  Intake/Output from previous day: 03/31 0701 - 04/01 0700 In: 123.5 [P.O.:30; IV Piggyback:93.5] Out: 400 [Stool:400] Intake/Output this shift: No intake/output data recorded.  PE: General--alert and oriented but very shaky   Lab Results: Recent Labs    11/21/17 0429 11/22/17 0430  WBC 22.2* 16.9*  HGB 8.7* 8.6*  HCT 29.8* 28.9*  PLT 35* 36*   BMET Recent Labs    11/20/17 1528 11/22/17 0430  NA 138 139  K 4.7 4.2  CL 103 104  CO2 24 24  CREATININE 1.46* 3.54*   LFT Recent Labs    11/22/17 0430  PROT 6.4*  AST 86*  ALT 38  ALKPHOS 144*  BILITOT 13.2*   PT/INR Recent Labs    11/21/17 0429 11/22/17 0945  LABPROT 17.0* 17.5*  INR 1.40 1.45   PANCREAS No results for input(s): LIPASE in the last 72 hours.       Studies/Results: Dg Chest Port 1 View  Result Date: 11/22/2017 CLINICAL DATA:  Respiratory failure EXAM: PORTABLE CHEST 1 VIEW COMPARISON:  11/16/2017 and prior exams FINDINGS: Cardiomediastinal silhouette is unchanged. An endotracheal tube and NG tube have been removed. A RIGHT IJ central venous catheter with tip overlying the mid SVC again noted. Mild bilateral interstitial opacities and LEFT basilar atelectasis again noted. There is no evidence of pneumothorax. IMPRESSION: Endotracheal tube and NG tube removal. Otherwise unchanged appearance of the chest.  Electronically Signed   By: Harmon Pier M.D.   On: 11/22/2017 07:17   Dg Swallowing Func-speech Pathology  Result Date: 11/21/2017 Objective Swallowing Evaluation: Type of Study: MBS-Modified Barium Swallow Study  Patient Details Name: [name] MRN: [id number] Date of Birth: [birth year] Today's Date: 11/21/2017 Time: SLP Start Time (ACUTE ONLY): 1222 -SLP Stop Time (ACUTE ONLY): 1233 SLP Time Calculation (min) (ACUTE ONLY): 11 min Past Medical History: Past Medical History: Diagnosis Date . Chronic lower back pain  . Compression fracture   lumbar 3 . Diabetes mellitus  . Fracture acetabulum-closed (HCC) 04/09/2013 . GERD (gastroesophageal reflux disease)  . H/O Legionnaire's disease  . Hepatomegaly  . Migraine  . Osteoporosis  . Pancreatitis  . PTSD (post-traumatic stress disorder)  . Vitamin D deficiency  Past Surgical History: Past Surgical History: Procedure Laterality Date . BACK SURGERY   . CHOLECYSTECTOMY   . HERNIA REPAIR   . KYPHOPLASTY N/A 07/16/2015  Procedure: Lumbar three kyphoplasty;  Surgeon: Coletta Memos, MD;  Location: [facility];  Service: Neurosurgery;  Laterality: N/A;  Lumbar three kyphoplasty . LAMINECTOMY   . MASS EXCISION Left 06/12/2017  Procedure: EXCISION LEFT AXILLARY SEBACEOUS CYST;  Surgeon: Abigail Miyamoto, MD;  Location:  SURGERY CENTER;  Service: General;  Laterality: Left; . STERNUM FRACTURE SURGERY   HPI: Pt is a 57 yo male who presented with decompensated cirrhosis with hepatic encephalopathy. PMHx includes DM, PTSD, compression fx's  and pain/ debilitation, GERD, EtOH abuse with cirrhosis. Pt recently hospitalized in Feb 2019 for flu. Previously seen by SLP in 2016 following intubation, with FEES revealing reversible dysphagia and pt was eventually d/c from SLP on regular solid and thin liquid diet. Intubated 3/17-3/20 and re-intubated 3/25-3/28. Pt currently on CRRT and nasal canula  Subjective: alert, eager for POs Assessment / Plan / Recommendation CHL IP  CLINICAL IMPRESSIONS 11/21/2017 Clinical Impression Pt has a mild oropharyngeal dysphagia that is likely multifactorial given recent intubations and generalized deconditioning, also impacted by his altered mentation although this does seem to be improving. Pt has mildly decreased bolus cohesion, allowing thin liquids to spill prematurely as far as the pyriform sinuses before triggering a swallow. He maintains good airway protection even across challenging with intermittent penetration that clears the laryngeal vestibule upon completion of the swallow. No aspiration is observed. Pt also has reduced base of tongue retraction that leaves mild vallecular residue after the swallow. Given presence of residue and considering mentation that has been fluctuating, recommend starting with Dys 3 diet and thin liquids. SLP will follow for tolerance and potential to advance. SLP Visit Diagnosis Dysphagia, oropharyngeal phase (R13.12) Attention and concentration deficit following -- Frontal lobe and executive function deficit following -- Impact on safety and function Mild aspiration risk   CHL IP TREATMENT RECOMMENDATION 11/21/2017 Treatment Recommendations Therapy as outlined in treatment plan below   Prognosis 11/21/2017 Prognosis for Safe Diet Advancement Good Barriers to Reach Goals -- Barriers/Prognosis Comment -- CHL IP DIET RECOMMENDATION 11/21/2017 SLP Diet Recommendations Dysphagia 3 (Mech soft) solids;Thin liquid Liquid Administration via Cup;Straw Medication Administration Whole meds with liquid Compensations Minimize environmental distractions;Slow rate;Small sips/bites Postural Changes Seated upright at 90 degrees   CHL IP OTHER RECOMMENDATIONS 11/21/2017 Recommended Consults -- Oral Care Recommendations Oral care BID Other Recommendations --   CHL IP FOLLOW UP RECOMMENDATIONS 11/21/2017 Follow up Recommendations None   CHL IP FREQUENCY AND DURATION 11/21/2017 Speech Therapy Frequency (ACUTE ONLY) min 2x/week Treatment  Duration 2 weeks      CHL IP ORAL PHASE 11/21/2017 Oral Phase Impaired Oral - Pudding Teaspoon -- Oral - Pudding Cup -- Oral - Honey Teaspoon -- Oral - Honey Cup -- Oral - Nectar Teaspoon -- Oral - Nectar Cup -- Oral - Nectar Straw -- Oral - Thin Teaspoon -- Oral - Thin Cup Decreased bolus cohesion;Premature spillage Oral - Thin Straw Decreased bolus cohesion;Premature spillage Oral - Puree WFL Oral - Mech Soft WFL Oral - Regular -- Oral - Multi-Consistency -- Oral - Pill WFL Oral Phase - Comment --  CHL IP PHARYNGEAL PHASE 11/21/2017 Pharyngeal Phase Impaired Pharyngeal- Pudding Teaspoon -- Pharyngeal -- Pharyngeal- Pudding Cup -- Pharyngeal -- Pharyngeal- Honey Teaspoon -- Pharyngeal -- Pharyngeal- Honey Cup -- Pharyngeal -- Pharyngeal- Nectar Teaspoon -- Pharyngeal -- Pharyngeal- Nectar Cup -- Pharyngeal -- Pharyngeal- Nectar Straw -- Pharyngeal -- Pharyngeal- Thin Teaspoon -- Pharyngeal -- Pharyngeal- Thin Cup Reduced tongue base retraction;Pharyngeal residue - valleculae;Penetration/Aspiration during swallow Pharyngeal Material enters airway, remains ABOVE vocal cords then ejected out Pharyngeal- Thin Straw Reduced tongue base retraction;Pharyngeal residue - valleculae;Penetration/Aspiration during swallow Pharyngeal Material enters airway, remains ABOVE vocal cords then ejected out Pharyngeal- Puree Reduced tongue base retraction;Pharyngeal residue - valleculae Pharyngeal -- Pharyngeal- Mechanical Soft Reduced tongue base retraction;Pharyngeal residue - valleculae Pharyngeal -- Pharyngeal- Regular -- Pharyngeal -- Pharyngeal- Multi-consistency -- Pharyngeal -- Pharyngeal- Pill Reduced tongue base retraction Pharyngeal -- Pharyngeal Comment --  CHL IP CERVICAL ESOPHAGEAL PHASE 11/21/2017 Cervical Esophageal Phase [name] Pudding  Teaspoon -- Pudding Cup -- Honey Teaspoon -- Honey Cup -- Nectar Teaspoon -- Nectar Cup -- Nectar Straw -- Thin Teaspoon -- Thin Cup -- Thin Straw -- Puree -- Mechanical Soft -- Regular --  Multi-consistency -- Pill -- Cervical Esophageal Comment -- No flowsheet data found. Maxcine Ham 11/21/2017, 2:20 PM  Maxcine Ham, M.A. CCC-SLP [phone]              Medications: I have reviewed the patient's current medications.  Assessment:   1.  Alcoholic cirrhosis with encephalopathy.  His liver appears to be relatively stable.  I have spent some time discussing with he and his wife that stable does not mean normal and it is absolutely crucial that he stay away from alcohol, avoid NSAIDs and Tylenol going forward. 2.  ATN/HRS.  Renal dysfunction is his major problem at this time.   Plan: 1.  Would continue treatment by the renal team. 2.  Strongly consider alcohol rehab after discharge is outpatient or AA.  Have discussed this with the patient and wife and they seem agreeable.  GI will not follow on a regular basis but be available if needed.  Please give [place] a call.   Tresea Mall 11/23/2017, 7:22 AM  This note was created using voice recognition software. Minor errors may Have occurred unintentionally.  Pager: [phone] If no answer or after hours call [phone]

## 2017-11-23 NOTE — Progress Notes (Signed)
Pt refuses Tele.

## 2017-11-23 NOTE — Progress Notes (Signed)
  Speech Language Pathology Treatment: Dysphagia  Patient Details Name: Faythe Gheearon P Missildine MRN: [id number] DOB: 01/29/1961 Today's Date: 11/23/2017 Time: [id number] SLP Time Calculation (min) (ACUTE ONLY): 11 min  Assessment / Plan / Recommendation Clinical Impression  Skilled treatment session focused on dysphagia goals. SLP facilitated session by providing skilled observation of pt consuming dysphagia 3 snack with thin liquids via straw. Pt with good oral clearing and no overt s/s of aspiration. Pt politely declined regular trials this session as he states he wanted to get use to chewing food again. Will continue to follow briefly for diet safety.    HPI HPI: Pt is a 57 yo male who presented with decompensated cirrhosis with hepatic encephalopathy. PMHx includes DM, PTSD, compression fx's and pain/ debilitation, GERD, EtOH abuse with cirrhosis. Pt recently hospitalized in Feb 2019 for flu. Previously seen by SLP in 2016 following intubation, with FEES revealing reversible dysphagia and pt was eventually d/c from SLP on regular solid and thin liquid diet. Intubated 3/17-3/20 and re-intubated 3/25-3/28. Pt currently on CRRT and nasal canula      SLP Plan  Continue with current plan of care       Recommendations  Diet recommendations: Dysphagia 3 (mechanical soft);Thin liquid Liquids provided via: Cup;Straw Medication Administration: Crushed with puree Supervision: Staff to assist with self feeding;Patient able to self feed Compensations: Minimize environmental distractions;Slow rate;Small sips/bites Postural Changes and/or Swallow Maneuvers: Seated upright 90 degrees                Oral Care Recommendations: Oral care BID Follow up Recommendations: None SLP Visit Diagnosis: Dysphagia, oropharyngeal phase (R13.12) Plan: Continue with current plan of care       GO                Dannisha Eckmann 11/23/2017, 2:20 PM

## 2017-11-23 NOTE — Care Management Important Message (Signed)
Important Message  Patient Details  Name: [name] MRN: [id number] Date of Birth: 04/12/1961   Medicare Important Message Given:  Yes    Rehema Muffley 11/23/2017, 3:11 PM

## 2017-11-23 NOTE — Progress Notes (Signed)
Palliative:  I met today with [name], his father, and stepmother. Introduced palliative care to them and our role to help support them. We discussed poor prognosis with liver and renal failure. He reports urine output which is good but he knows this may not be enough. Discussed what we look at for expectations for improved or declined renal function.   We discussed concern for renal failure and that this does not work well with liver failure as well. Discussed that if his renal function does not improve that we are in a much different place. He understands that his prognosis is overall poor. Wife is not at bedside and I did not elaborate further with his father present. He is interested in first monitoring renal function before discussing potential next steps. Emotional support provided.   He continues to have 8/10 low back pain that radiates down right leg. This is chronic pain. Previously on morphine and now on OxyIR. He says that his pain level is typically 8/10 and "I have learned to live with this." Would consider long acting such as fentanyl patch 12.5 mcg/hr BUT unsure who would consider continuing this upon discharge. He already states that he has a hard time obtaining pain meds from [place]. Continue scheduled Robaxin. Will add Lidoderm patch to low back.   Will follow up tomorrow.   20 min  Vinie Sill, NP Palliative Medicine Team Pager # [phone] (M-F 8a-5p) Team Phone # [phone] (Nights/Weekends)

## 2017-11-23 NOTE — Progress Notes (Signed)
Nutrition Follow-up  DOCUMENTATION CODES:   Not applicable  INTERVENTION:   -Nepro Shake po BID, each supplement provides 425 kcal and 19 grams protein  NUTRITION DIAGNOSIS:   Inadequate oral intake related to lethargy/confusion as evidenced by meal completion < 50%.  Ongoing  GOAL:   Patient will meet greater than or equal to 90% of their needs  Progressing   MONITOR:   PO intake, Supplement acceptance, Labs, I & O's  REASON FOR ASSESSMENT:   Ventilator, Consult Enteral/tube feeding initiation and management  ASSESSMENT:   57 yo male with PMH of GERD, osteoporosis, vitamin D deficiency, hepatomegaly, alcohol abuse, pancreatitis, DM, compression fracture, Legionnaire's DZ, PTSD who was admitted on 3/16 with decompensated cirrhosis with hepatic encephalopathy, hypoglycemia. Required intubation on 3/17.  3/30- s/p MBSS- advanced to dysphagia 3 diet with thin liquids, transferred from ICU to floor  Case discussed with RN, who reports pt taking pills and medications well. Rectal tube was removed today.   Spoke with pt at bedside, who was pleasant and in good spirits today. He states "little by little, I'm getting better everyday".  Meal completion 0-50%. Pt reports he consumed some eggs and sausage gravy this AM and is tolerating current diet texture well. He reports he gets full quickly while eating; he is amenable to supplements. Discussed importance of good meal and supplement intake to promote healing.   Reviewed I/O's: -277 ml x 24 hours and -5 L since 11/09/17  Medications reviewed and onclude lactulose, folvite, vitamin B-1.  Labs reviewed: CBGS: 132-238 (inpatient orders for glycemic control are 0-15 units insulun aspart TID with meals and q HS).   Diet Order:  DIET DYS 3 Room service appropriate? Yes; Fluid consistency: Thin  EDUCATION NEEDS:   No education needs have been identified at this time  Skin:  Skin Assessment: Skin Integrity Issues: Skin Integrity  Issues:: Other (Comment) Other: venous stasis/non pressure injury ulcers bilateral extremities  Last BM:  11/22/17 (400 ml via rectal tube)  Height:   Ht Readings from Last 1 Encounters:  11/22/17 5\' 6"  (1.676 m)    Weight:   Wt Readings from Last 1 Encounters:  11/23/17 182 lb 15.7 oz (83 kg)    Ideal Body Weight:  70 kg  BMI:  Body mass index is 29.53 kg/m.  Estimated Nutritional Needs:   Kcal:  2100-2300  Protein:  115-130 gm  Fluid:  1.2 L    Dhruvan Gullion A. Mayford KnifeWilliams, RD, LDN, CDE Pager: [phone] After hours Pager: [phone]

## 2017-11-23 NOTE — Progress Notes (Signed)
Pt continues to refuse to had Tele placed. Multiple attempts this shift.

## 2017-11-24 LAB — GLUCOSE, CAPILLARY
GLUCOSE-CAPILLARY: 172 mg/dL — AB (ref 65–99)
GLUCOSE-CAPILLARY: 185 mg/dL — AB (ref 65–99)
GLUCOSE-CAPILLARY: 160 mg/dL — AB (ref 65–99)

## 2017-11-24 LAB — RENAL FUNCTION PANEL
ALBUMIN: 3.1 g/dL — AB (ref 3.5–5.0)
ANION GAP: 15 (ref 5–15)
BUN: 24 mg/dL — ABNORMAL HIGH (ref 6–20)
CALCIUM: 8.7 mg/dL — AB (ref 8.9–10.3)
CO2: 21 mmol/L — AB (ref 22–32)
CREATININE: 6.08 mg/dL — AB (ref 0.61–1.24)
Chloride: 105 mmol/L (ref 101–111)
GFR, EST AFRICAN AMERICAN: 11 mL/min — AB (ref >60)
GFR, EST NON AFRICAN AMERICAN: 9 mL/min — AB (ref >60)
Glucose, Bld: 133 mg/dL — ABNORMAL HIGH (ref 65–99)
PHOSPHORUS: 5 mg/dL — AB (ref 2.5–4.6)
Potassium: 4 mmol/L (ref 3.5–5.1)
SODIUM: 141 mmol/L (ref 135–145)

## 2017-11-24 LAB — CBC WITH DIFFERENTIAL/PLATELET
BASOS ABS: 0 10*3/uL (ref 0.0–0.1)
BLASTS: 0 %
Band Neutrophils: 12 %
Basophils Relative: 0 %
EOS PCT: 0 %
Eosinophils Absolute: 0 10*3/uL (ref 0.0–0.7)
HEMATOCRIT: 29.6 % — AB (ref 39.0–52.0)
HEMOGLOBIN: 9 g/dL — AB (ref 13.0–17.0)
LYMPHS ABS: 0.8 10*3/uL (ref 0.7–4.0)
Lymphocytes Relative: 10 %
MCH: 37 pg — ABNORMAL HIGH (ref 26.0–34.0)
MCHC: 30.4 g/dL (ref 30.0–36.0)
MCV: 121.8 fL — AB (ref 78.0–100.0)
MYELOCYTES: 1 %
Metamyelocytes Relative: 3 %
Monocytes Absolute: 0.4 10*3/uL (ref 0.1–1.0)
Monocytes Relative: 5 %
NEUTROS PCT: 69 %
NRBC: 1 /100{WBCs} — AB
Neutro Abs: 6.8 10*3/uL (ref 1.7–7.7)
Other: 0 %
Platelets: 38 10*3/uL — ABNORMAL LOW (ref 150–400)
Promyelocytes Absolute: 0 %
RBC: 2.43 MIL/uL — AB (ref 4.22–5.81)
RDW: 17.1 % — AB (ref 11.5–15.5)
WBC: 8 10*3/uL (ref 4.0–10.5)

## 2017-11-24 LAB — PHOSPHORUS: PHOSPHORUS: 4.9 mg/dL — AB (ref 2.5–4.6)

## 2017-11-24 LAB — MAGNESIUM: Magnesium: 2.7 mg/dL — ABNORMAL HIGH (ref 1.7–2.4)

## 2017-11-24 MED ORDER — HEPARIN SODIUM (PORCINE) 1000 UNIT/ML DIALYSIS
1000.0000 [IU] | INTRAMUSCULAR | Status: DC | PRN
  Administered 2017-11-24: 1000 [IU] via INTRAVENOUS_CENTRAL
  Filled 2017-11-24 (×2): qty 1

## 2017-11-24 MED ORDER — LIDOCAINE HCL (PF) 1 % IJ SOLN: 5.0000 mL | INTRAMUSCULAR | Status: DC | PRN

## 2017-11-24 MED ORDER — SODIUM CHLORIDE 0.9 % IV SOLN: 100.0000 mL | INTRAVENOUS | Status: DC | PRN

## 2017-11-24 MED ORDER — ALTEPLASE 2 MG IJ SOLR: 2.0000 mg | Freq: Once | INTRAMUSCULAR | Status: DC | PRN

## 2017-11-24 MED ORDER — LIDOCAINE-PRILOCAINE 2.5-2.5 % EX CREA
1.0000 "application " | TOPICAL_CREAM | CUTANEOUS | Status: DC | PRN
  Filled 2017-11-24: qty 5

## 2017-11-24 MED ORDER — PENTAFLUOROPROP-TETRAFLUOROETH EX AERO: 1.0000 "application " | INHALATION_SPRAY | CUTANEOUS | Status: DC | PRN

## 2017-11-24 NOTE — Social Work (Signed)
CSW just coming on to pt case now that pt is transferred to 6N- pt will need updated PT/OT evaluations in order to place pt if it's needed. Pt expressed wanting to go home to previous CSW.   CSW paged MD for updated PT/OT.   Doy HutchingIsabel H Iona Stay, LCSWA [facility] Clinical Social Work [phone]

## 2017-11-24 NOTE — Progress Notes (Signed)
S: No new complaints today O:BP (!) 85/60 (BP Location: Left Arm)   Pulse 60   Temp 98.1 F (36.7 C) (Oral)   Resp 18   Ht 5\' 6"  (1.676 m)   Wt 83.3 kg (183 lb 10.3 oz)   SpO2 97%   BMI 29.64 kg/m   Intake/Output Summary (Last 24 hours) at 11/24/2017 1241 Last data filed at 11/23/2017 2015 Gross per 24 hour  Intake 340 ml  Output -  Net 340 ml   Intake/Output: I/O last 3 completed shifts: In: 470 [P.O.:470] Out: 0   Intake/Output this shift:  No intake/output data recorded. Weight change: 0.3 kg (10.6 oz) Gen: sleepy and in NAD CVS: no rub Resp: cta with diminished BS at bases Abd: distended Ext: 1+ edema  Recent Labs  Lab 11/18/17 0341  11/19/17 0410 11/19/17 1611 11/20/17 0437 11/20/17 1528 11/22/17 0430 11/23/17 1533 11/23/17 1534 11/24/17 0429  NA 139   < > 139 139 138 138 139  --  141 141  K 4.1   < > 4.2 4.0 4.5 4.7 4.2  --  4.2 4.0  CL 102   < > 104 103 103 103 104  --  104 105  CO2 25   < > 25 25 25 24 24   --  19* 21*  GLUCOSE 198*   < > 248* 123* 125* 138* 138*  --  296* 133*  BUN 13   < > 9 8 6  5* 13  --  22* 24*  CREATININE 2.75*   < > 1.73* 1.59* 1.46* 1.46* 3.54*  --  5.68* 6.08*  ALBUMIN 3.3*   < > 3.2* 2.8* 2.7* 2.9* 2.9*  --  3.4* 3.1*  CALCIUM 8.6*   < > 8.5* 8.3* 8.2* 8.4* 8.6*  --  9.0 8.7*  PHOS 1.9*   < > 1.8* 2.3* 2.3* 2.3* 3.6 5.2*  --  4.9*  5.0*  AST 80*  --  77*  --   --   --  86*  --  70*  --   ALT 36  --  33  --   --   --  38  --  37  --    < > = values in this interval not displayed.   Liver Function Tests: Recent Labs  Lab 11/19/17 0410  11/22/17 0430 11/23/17 1534 11/24/17 0429  AST 77*  --  86* 70*  --   ALT 33  --  38 37  --   ALKPHOS 158*  --  144* 141*  --   BILITOT 11.9*  --  13.2* 11.6*  --   PROT 6.7  --  6.4* 6.8  --   ALBUMIN 3.2*   < > 2.9* 3.4* 3.1*   < > = values in this interval not displayed.   No results for input(s): LIPASE, AMYLASE in the last 168 hours. Recent Labs  Lab 11/20/17 0438  11/21/17 0427 11/22/17 0430  AMMONIA 48* 45* 51*   CBC: Recent Labs  Lab 11/20/17 0636 11/21/17 0429 11/22/17 0430 11/23/17 1533 11/24/17 0429  WBC 20.7* 22.2* 16.9* 12.4* 8.0  NEUTROABS  --   --  13.3* 9.5* 6.8  HGB 9.4* 8.7* 8.6* 8.4* 9.0*  HCT 30.3* 29.8* 28.9* 28.8* 29.6*  MCV 120.7* 122.6* 122.5* 122.0* 121.8*  PLT 20* 35* 36* 39* 38*   Cardiac Enzymes: No results for input(s): CKTOTAL, CKMB, CKMBINDEX, TROPONINI in the last 168 hours. CBG: Recent Labs  Lab 11/23/17 0755 11/23/17 1223  11/23/17 1720 11/23/17 2131 11/24/17 0818  GLUCAP 188* 238* 296* 220* 172*    Iron Studies: No results for input(s): IRON, TIBC, TRANSFERRIN, FERRITIN in the last 72 hours. Studies/Results: No results found. . chlorhexidine  15 mL Mouth Rinse BID  . Chlorhexidine Gluconate Cloth  6 each Topical Daily  . famotidine  20 mg Oral QHS  . feeding supplement (NEPRO CARB STEADY)  237 mL Oral BID BM  . folic acid  1 mg Oral Daily  . insulin aspart  0-15 Units Subcutaneous TID AC & HS  . lactulose  20 g Per Tube TID   Or  . lactulose  300 mL Rectal TID  . lidocaine  1 patch Transdermal Q24H  . mouth rinse  15 mL Mouth Rinse q12n4p  . methocarbamol  500 mg Oral TID  . midodrine  10 mg Oral TID WC  . octreotide  200 mcg Subcutaneous BID  . QUEtiapine  25 mg Oral QHS  . rifaximin  550 mg Oral BID  . sodium chloride flush  10-40 mL Intracatheter Q12H  . thiamine  100 mg Oral Daily    BMET    Component Value Date/Time   NA 141 11/24/2017 0429   NA 137 10/22/2017 1542   K 4.0 11/24/2017 0429   CL 105 11/24/2017 0429   CO2 21 (L) 11/24/2017 0429   GLUCOSE 133 (H) 11/24/2017 0429   BUN 24 (H) 11/24/2017 0429   BUN 5 (L) 10/22/2017 1542   CREATININE 6.08 (H) 11/24/2017 0429   CALCIUM 8.7 (L) 11/24/2017 0429   GFRNONAA 9 (L) 11/24/2017 0429   GFRAA 11 (L) 11/24/2017 0429   CBC    Component Value Date/Time   WBC 8.0 11/24/2017 0429   RBC 2.43 (L) 11/24/2017 0429   HGB 9.0 (L)  11/24/2017 0429   HGB 13.1 10/22/2017 1542   HCT 29.6 (L) 11/24/2017 0429   HCT 40.3 10/22/2017 1542   PLT 38 (L) 11/24/2017 0429   PLT 107 (L) 10/22/2017 1542   MCV 121.8 (H) 11/24/2017 0429   MCV 110 (H) 10/22/2017 1542   MCH 37.0 (H) 11/24/2017 0429   MCHC 30.4 11/24/2017 0429   RDW 17.1 (H) 11/24/2017 0429   RDW 15.1 10/22/2017 1542   LYMPHSABS 0.8 11/24/2017 0429   LYMPHSABS 1.4 10/22/2017 1542   MONOABS 0.4 11/24/2017 0429   EOSABS 0.0 11/24/2017 0429   EOSABS 0.0 10/22/2017 1542   BASOSABS 0.0 11/24/2017 0429   BASOSABS 0.1 10/22/2017 1542   Brief HPI:  57 year old man with a history of diabetes Parkinson compression fractures, CAD, and alcohol abuse. He continued to drink 1 bottle of wine daily prior to admission.He presented to the ED with 2 weeks of increasing edema abdominal swelling and lethargy. Hypoglycemic due to ongoing insulin use.  Intubated for airway protection with hypotension. Treated empirically for presumed SBP although quantity of ascites was notsufficient to tap.  He developed worsening renal failure and started on CVVHD 3/26-3/29/19.  He has been extubated.  Assessment/Plan:  1. AKI- in setting of decompensated cirrhosis, hypotension, presumably due to SBP and started on CVVHD 3/26-3/29/19.  Also had diarrhea and has been negative with I's/O's by 5 liters since admission.  No significant UOP to date.  Hopefully this is related to ischemic ATN, although hepatorenal remains on the DDx.  He is not a candidate for liver transplant due to ongoing alcohol consumption at the time of admission.  Currently on midodrine and octreotide.   1. Will attempt HD  today but did speak with patient and his family that if he is not able to tolerate intermittent HD that there is nothing more we can do for him.  If this is hepatorenal syndrome, will need to get palliative care involved as he will not do well. Not sure he would be able to tolerate intermittent HD with his low BP  despite midodrine. 2. Alcoholic cirrhosis with encephalopathy- also with rising bilirubin.  On lactulose 3. Alcohol abuse- will need rehab and ongoing treatment to prevent withdrawal.   4. Disposition- prognosis remains poor and recommend palliative care to help set goals/limits of care.   Irena CordsJoseph A. Race Latour, MD [facility] [phone]

## 2017-11-24 NOTE — Progress Notes (Signed)
Palliative:  I met again today with [name], father, stepmother, and we were joined by his wife, [name]. I introduced palliative care and we discussed plan for dialysis today. We discussed the fact that his renal function continues to worsen is a very bad sign with his liver disease. We discussed some of our concerns about his ability to tolerate dialysis. [name] is fairly anxious about this fact and that he may not tolerate dialysis. I believe he understands exactly what this means. [name] asks about placing him on BiPAP if he does not tolerate dialysis and CRRT. I explained that we would not place him back on CRRT because that begins the cycle all over again. I also explained that BiPAP would also not be the answer because if he does not tolerate dialysis than our options become very limited to focusing on comfort care. I could tell [name] was becoming increasingly anxious so I told them that we will see how dialysis goes today and will discuss further tomorrow.   I believe [name] is so anxious because he does understand how limited his options are and that he may likely be facing EOL. Will continue to follow, discuss, and offer support.   21 min  Vinie Sill, NP Palliative Medicine Team Pager # [phone] (M-F 8a-5p) Team Phone # [phone] (Nights/Weekends)

## 2017-11-24 NOTE — Progress Notes (Signed)
PROGRESS NOTE    [name]  ZOX:[id number] DOB: [birth year] DOA: 11/07/2017 PCP: Dettinger, Elige Radon, MD  Outpatient Specialists:    Brief Narrative:  57 year old man with history of compression fractures, chronic back pain, alcohol abuse, and liver cirrhosis.  Hospitalized for flu in February. Came to ED 11/07/17 with 2 weeks of increased edema, abdominal swelling, 1-2 days of lethargy, and altered mental status. Drinking approximately 1 bottle of wine a day at time of admission. He has been transferred to [facility] care after 15 day hospital stay so far. Current major problems are decompensation of cirrhosis of liver and hepatorenal syndrome/ATN. BUN 24, Cr 6.08, GFR 11, Hgb 9.0, MCV 121.8. Remains anuric with no recovery of renal function. Intermittent hemodialysis started today, palliative care has been consulted.   Assessment & Plan:   Active Problems:   Hypoxia   Decompensation of cirrhosis of liver (HCC)   Acute renal failure (ARF) (HCC)   Goals of care, counseling/discussion   Palliative care by specialist  1. Acute encephalopathy secondary to alcoholic cirrhosis - 15 day hospital stay so far, Required precedex and intubation x 2 - Largely resolved, patient is awake, attentive and can respond to questions - Continue Seroquel   - previous PT consult suggested SNF, ordered new PT/OT consult since pt transfer  2. Hepatorenal syndrome type 1/ATN - followed by nephrology - one loose bowel movement this morning, no significant urine output to date - per nephrology, not a candidate for liver transplant due to ongoing alcohol consumption (approx. 1 bottle of wine a day) at time of admission  - continue midodrine and ocreotide - continue albumin IV BID - Nephrology started intermitted hemodialysis today - Palliative care has been consulted  3.  Decompensation of cirrhosis of liver (HCC) - Continue lactulose and rifaximin  4. Chronic back pain - Continue oxycodone, Robaxin,  lidoderm patch  5. Macrocytic Anemia - HgB 9.0, MCV 121.8, B12 478 - Folic Acid started 3/30   DVT prophylaxis: SCD's Code Status: Partial (no CPR or intubation, pressors only) Family Communication: Spoke with wife, father and stepmother Disposition Plan: home when clinically stable   Consultants:   Pulmonology  Nephrology  Gastroenterology  Palliative Care  PT/OT  Procedures:   Antimicrobials: Ceftriaxone 3/16-3/22 vancomycin 3/17-3/18 Zosyn 3/24-3/30  Subjective: Patient awake and responsive to questions, appeared in no acute distress. Complained of abdominal and bilateral leg swelling.  Objective: Vitals:   11/23/17 1500 11/23/17 2128 11/24/17 0449 11/24/17 0500  BP: (!) 105/59 100/61 (!) 85/60   Pulse: 74 60 60   Resp: 18 18 18    Temp: 98.1 F (36.7 C) 98.1 F (36.7 C) 98.1 F (36.7 C)   TempSrc: Oral Oral Oral   SpO2: 100% 96% 97%   Weight:    83.3 kg (183 lb 10.3 oz)  Height:        Intake/Output Summary (Last 24 hours) at 11/24/2017 1342 Last data filed at 11/23/2017 2015 Gross per 24 hour  Intake 240 ml  Output -  Net 240 ml   Filed Weights   11/22/17 1254 11/23/17 0500 11/24/17 0500  Weight: 83 kg (182 lb 15.7 oz) 83 kg (182 lb 15.7 oz) 83.3 kg (183 lb 10.3 oz)    Examination:  General exam: Appears calm and comfortable, in no acute distress. Respiratory system: Bilateral wheezes. Respiratory effort normal. Cardiovascular system: S1 & S2 heard, RRR. No JVD, murmurs, rubs, gallops or clicks. Gastrointestinal system: Abdomen is distended, soft and nontender. Normal bowel  sounds heard. No signs of ascites. Mild icterus and periorbital swelling. Central nervous system: Alert and oriented. No focal neurological deficits. No asterixis. Extremities: Bilateral venous stasis in legs with 1+ edema. Skin: No rashes.  Psychiatry: Judgement and insight appear normal. Mood & affect appropriate.    Data Reviewed: I have personally reviewed following  labs and imaging studies  CBC: Recent Labs  Lab 11/18/17 0341  11/20/17 0636 11/21/17 0429 11/22/17 0430 11/23/17 1533 11/24/17 0429  WBC 22.3*   < > 20.7* 22.2* 16.9* 12.4* 8.0  NEUTROABS 18.3*  --   --   --  13.3* 9.5* 6.8  HGB 9.8*   < > 9.4* 8.7* 8.6* 8.4* 9.0*  HCT 32.3*   < > 30.3* 29.8* 28.9* 28.8* 29.6*  MCV 118.3*   < > 120.7* 122.6* 122.5* 122.0* 121.8*  PLT 16*   < > 20* 35* 36* 39* 38*   < > = values in this interval not displayed.   Basic Metabolic Panel: Recent Labs  Lab 11/20/17 0437 11/20/17 1528 11/21/17 0429 11/22/17 0430 11/23/17 1533 11/23/17 1534 11/24/17 0429  NA 138 138  --  139  --  141 141  K 4.5 4.7  --  4.2  --  4.2 4.0  CL 103 103  --  104  --  104 105  CO2 25 24  --  24  --  19* 21*  GLUCOSE 125* 138*  --  138*  --  296* 133*  BUN 6 5*  --  13  --  22* 24*  CREATININE 1.46* 1.46*  --  3.54*  --  5.68* 6.08*  CALCIUM 8.2* 8.4*  --  8.6*  --  9.0 8.7*  MG 2.4  --  2.3 2.6* 2.8*  --  2.7*  PHOS 2.3* 2.3*  --  3.6 5.2*  --  4.9*  5.0*   GFR: Estimated Creatinine Clearance: 13.9 mL/min (A) (by C-G formula based on SCr of 6.08 mg/dL (H)). Liver Function Tests: Recent Labs  Lab 11/18/17 0341  11/19/17 0410  11/20/17 0437 11/20/17 1528 11/22/17 0430 11/23/17 1534 11/24/17 0429  AST 80*  --  77*  --   --   --  86* 70*  --   ALT 36  --  33  --   --   --  38 37  --   ALKPHOS 170*  --  158*  --   --   --  144* 141*  --   BILITOT 12.3*  --  11.9*  --   --   --  13.2* 11.6*  --   PROT 6.5  --  6.7  --   --   --  6.4* 6.8  --   ALBUMIN 3.3*   < > 3.2*   < > 2.7* 2.9* 2.9* 3.4* 3.1*   < > = values in this interval not displayed.   No results for input(s): LIPASE, AMYLASE in the last 168 hours. Recent Labs  Lab 11/18/17 0345 11/19/17 0410 11/20/17 0438 11/21/17 0427 11/22/17 0430  AMMONIA 54* 44* 48* 45* 51*   Coagulation Profile: Recent Labs  Lab 11/18/17 0341 11/19/17 0410 11/20/17 0437 11/21/17 0429 11/22/17 0945  INR 1.54  1.48 1.45 1.40 1.45   Cardiac Enzymes: No results for input(s): CKTOTAL, CKMB, CKMBINDEX, TROPONINI in the last 168 hours. BNP (last 3 results) No results for input(s): PROBNP in the last 8760 hours. HbA1C: No results for input(s): HGBA1C in the last 72 hours. CBG: Recent  Labs  Lab 11/23/17 1223 11/23/17 1720 11/23/17 2131 11/24/17 0818 11/24/17 1303  GLUCAP 238* 296* 220* 172* 185*   Lipid Profile: No results for input(s): CHOL, HDL, LDLCALC, TRIG, CHOLHDL, LDLDIRECT in the last 72 hours. Thyroid Function Tests: No results for input(s): TSH, T4TOTAL, FREET4, T3FREE, THYROIDAB in the last 72 hours. Anemia Panel: No results for input(s): VITAMINB12, FOLATE, FERRITIN, TIBC, IRON, RETICCTPCT in the last 72 hours. Urine analysis:    Component Value Date/Time   COLORURINE YELLOW 11/11/2017 1205   APPEARANCEUR TURBID (A) 11/11/2017 1205   LABSPEC 1.025 11/11/2017 1205   PHURINE 6.5 11/11/2017 1205   GLUCOSEU NEGATIVE 11/11/2017 1205   HGBUR LARGE (A) 11/11/2017 1205   BILIRUBINUR LARGE (A) 11/11/2017 1205   BILIRUBINUR n 02/28/2011 1247   KETONESUR NEGATIVE 11/11/2017 1205   PROTEINUR >300 (A) 11/11/2017 1205   UROBILINOGEN 1.0 01/17/2015 1305   NITRITE NEGATIVE 11/11/2017 1205   LEUKOCYTESUR MODERATE (A) 11/11/2017 1205   Sepsis Labs: @LABRCNTIP (procalcitonin:4,lacticidven:4)  ) Recent Results (from the past 240 hour(s))  Culture, blood (routine x 2)     Status: None   Collection Time: 11/15/17  1:57 PM  Result Value Ref Range Status   Specimen Description BLOOD LEFT HAND  Final   Special Requests   Final    BOTTLES DRAWN AEROBIC ONLY Blood Culture adequate volume   Culture   Final    NO GROWTH 5 DAYS Performed at [facility], [address] [place], [place] [address]    Report Status 11/20/2017 FINAL  Final  Culture, blood (routine x 2)     Status: None   Collection Time: 11/15/17  2:37 PM  Result Value Ref Range Status   Specimen Description BLOOD LEFT  HAND  Final   Special Requests IN PEDIATRIC BOTTLE Blood Culture adequate volume  Final   Culture   Final    NO GROWTH 5 DAYS Performed at [facility], [address] [place], [place] [address]    Report Status 11/20/2017 FINAL  Final     Radiology Studies: No results found.   Scheduled Meds: . chlorhexidine  15 mL Mouth Rinse BID  . Chlorhexidine Gluconate Cloth  6 each Topical Daily  . famotidine  20 mg Oral QHS  . feeding supplement (NEPRO CARB STEADY)  237 mL Oral BID BM  . folic acid  1 mg Oral Daily  . insulin aspart  0-15 Units Subcutaneous TID AC & HS  . lactulose  20 g Per Tube TID   Or  . lactulose  300 mL Rectal TID  . lidocaine  1 patch Transdermal Q24H  . mouth rinse  15 mL Mouth Rinse q12n4p  . methocarbamol  500 mg Oral TID  . midodrine  10 mg Oral TID WC  . octreotide  200 mcg Subcutaneous BID  . QUEtiapine  25 mg Oral QHS  . rifaximin  550 mg Oral BID  . sodium chloride flush  10-40 mL Intracatheter Q12H  . thiamine  100 mg Oral Daily   Continuous Infusions: . sodium chloride Stopped (11/20/17 0900)  . albumin human       LOS: 17 days     Janine OresBlaine Gianny Sabino, PA-S  [facility] If 7PM-7AM, please contact night-coverage [email or web address] Password [email or web address] 11/24/2017, 1:42 PM

## 2017-11-24 NOTE — Progress Notes (Signed)
PROGRESS NOTE    [name]  [id number] DOB: 06/25/1961 DOA: 11/07/2017 PCP: Dettinger, Elige RadonJoshua A, MD    Brief Narrative:  57 yo male presented with encephalopathy due to decompensated liver failure. Patient does have the significant past medical history for alcoholic cirrhosis, type 2 diabetes mellitus, and PTSD. Patient had a prolonged hospital stay required invasive mechanical ventilation. Had aspiration pneumonia, and worsening renal failure. Patient has been placed on renal replacement therapy. He has completed his antibiotic therapy. His mentation has improved. He has been transitioned from continuous renal replacement therapy to intermittent hemodialysis.      Assessment & Plan:   Active Problems:   Hypoxia   Decompensation of cirrhosis of liver (HCC)   Acute renal failure (ARF) (HCC)   Goals of care, counseling/discussion   Palliative care by specialist  1. Acute metabolic encephalopathy, porto systemic. Clinically has improved, patient is awake and alert. No confusion or agitation, no asterixis. Will continue medical therapy with lactulose and rifaximin.   2. Acute kidney injury with acute tubular necrosis/ hepatorenal syndrome. Now patient on intermittent HD, clinically euvolemic. On IV albumin, midodrine and octreotide. NA at 141 with bicarb at 24 and serum K at 4.0.   3. Alcohol abuse. No signs of withdrawal, will continue neuro checks per unit protocol, patient is tolerating po well.   4. Chronic pain syndrome. Will continue current regimen.   5. Chronic liver failure due to alcoholic cirrhosis. No significant ascites on physical examination, no further confusion, INR at 1.45. Continue supportive medical therapy.   6. T2DM. Will continue glucose cover and monitoring with insulin sliding scale. Capillary glucose 220, 172, 185.     DVT prophylaxis: scd  Code Status: dnr Family Communication: no family at bedside  Disposition Plan: depending renal response.     Consultants:   Nephrology   Procedures:   Antimicrobials:      Subjective: Patient is feeing better, no nausea or vomiting, no dyspnea or chest pain. Feeling very weak and deconditioned.   Objective: Vitals:   11/23/17 2128 11/24/17 0449 11/24/17 0500 11/24/17 1448  BP: 100/61 (!) 85/60  106/73  Pulse: 60 60  68  Resp: 18 18    Temp: 98.1 F (36.7 C) 98.1 F (36.7 C)  98.4 F (36.9 C)  TempSrc: Oral Oral  Oral  SpO2: 96% 97%  96%  Weight:   83.3 kg (183 lb 10.3 oz)   Height:        Intake/Output Summary (Last 24 hours) at 11/24/2017 1627 Last data filed at 11/23/2017 2015 Gross per 24 hour  Intake 240 ml  Output -  Net 240 ml   Filed Weights   11/22/17 1254 11/23/17 0500 11/24/17 0500  Weight: 83 kg (182 lb 15.7 oz) 83 kg (182 lb 15.7 oz) 83.3 kg (183 lb 10.3 oz)    Examination:   General: deconditioned and ill looking appearing Neurology: Awake and alert, non focal  E ENT: mild pallor, positive icterus, oral mucosa moist. Peri-orbital edema.  Cardiovascular: No JVD. S1-S2 present, rhythmic, no gallops, rubs, or murmurs. ++ + non pitting lower extremity edema. Pulmonary: decreased breath sounds bilaterally, poor inspiratory effort, no wheezing, rhonchi or rales. Gastrointestinal. Abdomen protuberant, no organomegaly, non tender, no rebound or guarding Skin. No rashes Musculoskeletal: no joint deformities     Data Reviewed: I have personally reviewed following labs and imaging studies  CBC: Recent Labs  Lab 11/18/17 0341  11/20/17 0636 11/21/17 0429 11/22/17 0430 11/23/17 1533 11/24/17 [id number]  WBC 22.3*   < > 20.7* 22.2* 16.9* 12.4* 8.0  NEUTROABS 18.3*  --   --   --  13.3* 9.5* 6.8  HGB 9.8*   < > 9.4* 8.7* 8.6* 8.4* 9.0*  HCT 32.3*   < > 30.3* 29.8* 28.9* 28.8* 29.6*  MCV 118.3*   < > 120.7* 122.6* 122.5* 122.0* 121.8*  PLT 16*   < > 20* 35* 36* 39* 38*   < > = values in this interval not displayed.   Basic Metabolic Panel: Recent Labs   Lab 11/20/17 0437 11/20/17 1528 11/21/17 0429 11/22/17 0430 11/23/17 1533 11/23/17 1534 11/24/17 0429  NA 138 138  --  139  --  141 141  K 4.5 4.7  --  4.2  --  4.2 4.0  CL 103 103  --  104  --  104 105  CO2 25 24  --  24  --  19* 21*  GLUCOSE 125* 138*  --  138*  --  296* 133*  BUN 6 5*  --  13  --  22* 24*  CREATININE 1.46* 1.46*  --  3.54*  --  5.68* 6.08*  CALCIUM 8.2* 8.4*  --  8.6*  --  9.0 8.7*  MG 2.4  --  2.3 2.6* 2.8*  --  2.7*  PHOS 2.3* 2.3*  --  3.6 5.2*  --  4.9*  5.0*   GFR: Estimated Creatinine Clearance: 13.9 mL/min (A) (by C-G formula based on SCr of 6.08 mg/dL (H)). Liver Function Tests: Recent Labs  Lab 11/18/17 0341  11/19/17 0410  11/20/17 0437 11/20/17 1528 11/22/17 0430 11/23/17 1534 11/24/17 0429  AST 80*  --  77*  --   --   --  86* 70*  --   ALT 36  --  33  --   --   --  38 37  --   ALKPHOS 170*  --  158*  --   --   --  144* 141*  --   BILITOT 12.3*  --  11.9*  --   --   --  13.2* 11.6*  --   PROT 6.5  --  6.7  --   --   --  6.4* 6.8  --   ALBUMIN 3.3*   < > 3.2*   < > 2.7* 2.9* 2.9* 3.4* 3.1*   < > = values in this interval not displayed.   No results for input(s): LIPASE, AMYLASE in the last 168 hours. Recent Labs  Lab 11/18/17 0345 11/19/17 0410 11/20/17 0438 11/21/17 0427 11/22/17 0430  AMMONIA 54* 44* 48* 45* 51*   Coagulation Profile: Recent Labs  Lab 11/18/17 0341 11/19/17 0410 11/20/17 0437 11/21/17 0429 11/22/17 0945  INR 1.54 1.48 1.45 1.40 1.45   Cardiac Enzymes: No results for input(s): CKTOTAL, CKMB, CKMBINDEX, TROPONINI in the last 168 hours. BNP (last 3 results) No results for input(s): PROBNP in the last 8760 hours. HbA1C: No results for input(s): HGBA1C in the last 72 hours. CBG: Recent Labs  Lab 11/23/17 1223 11/23/17 1720 11/23/17 2131 11/24/17 0818 11/24/17 1303  GLUCAP 238* 296* 220* 172* 185*   Lipid Profile: No results for input(s): CHOL, HDL, LDLCALC, TRIG, CHOLHDL, LDLDIRECT in the last  72 hours. Thyroid Function Tests: No results for input(s): TSH, T4TOTAL, FREET4, T3FREE, THYROIDAB in the last 72 hours. Anemia Panel: No results for input(s): VITAMINB12, FOLATE, FERRITIN, TIBC, IRON, RETICCTPCT in the last 72 hours.    Radiology Studies: I have  reviewed all of the imaging during this hospital visit personally     Scheduled Meds: . chlorhexidine  15 mL Mouth Rinse BID  . Chlorhexidine Gluconate Cloth  6 each Topical Daily  . famotidine  20 mg Oral QHS  . feeding supplement (NEPRO CARB STEADY)  237 mL Oral BID BM  . folic acid  1 mg Oral Daily  . insulin aspart  0-15 Units Subcutaneous TID AC & HS  . lactulose  20 g Per Tube TID   Or  . lactulose  300 mL Rectal TID  . lidocaine  1 patch Transdermal Q24H  . mouth rinse  15 mL Mouth Rinse q12n4p  . methocarbamol  500 mg Oral TID  . midodrine  10 mg Oral TID WC  . octreotide  200 mcg Subcutaneous BID  . QUEtiapine  25 mg Oral QHS  . rifaximin  550 mg Oral BID  . sodium chloride flush  10-40 mL Intracatheter Q12H  . thiamine  100 mg Oral Daily   Continuous Infusions: . sodium chloride Stopped (11/20/17 0900)  . sodium chloride    . sodium chloride    . albumin human       LOS: 17 days        Coralie Keens, MD [facility] Pager [phone]

## 2017-11-24 NOTE — Progress Notes (Signed)
HD tx completed @ 1930 w/o problem, UF goal met, VSS, attempted to call report to primary nurse but nurse unavailable, awaiting call back, let secretary know that pt was already in for transport and could possibly arrive to unit prior to  Him calling me back for report.

## 2017-11-25 DIAGNOSIS — D696 Thrombocytopenia, unspecified: Secondary | ICD-10-CM

## 2017-11-25 DIAGNOSIS — N186 End stage renal disease: Secondary | ICD-10-CM

## 2017-11-25 DIAGNOSIS — F1010 Alcohol abuse, uncomplicated: Secondary | ICD-10-CM

## 2017-11-25 DIAGNOSIS — Z992 Dependence on renal dialysis: Secondary | ICD-10-CM

## 2017-11-25 LAB — BASIC METABOLIC PANEL
Anion gap: 13 (ref 5–15)
BUN: 9 mg/dL (ref 6–20)
CHLORIDE: 104 mmol/L (ref 101–111)
CO2: 23 mmol/L (ref 22–32)
Calcium: 8.6 mg/dL — ABNORMAL LOW (ref 8.9–10.3)
Creatinine, Ser: 4.19 mg/dL — ABNORMAL HIGH (ref 0.61–1.24)
GFR calc Af Amer: 17 mL/min — ABNORMAL LOW (ref >60)
GFR calc non Af Amer: 15 mL/min — ABNORMAL LOW (ref >60)
GLUCOSE: 203 mg/dL — AB (ref 65–99)
POTASSIUM: 4.2 mmol/L (ref 3.5–5.1)
Sodium: 140 mmol/L (ref 135–145)

## 2017-11-25 LAB — GLUCOSE, CAPILLARY
GLUCOSE-CAPILLARY: 95 mg/dL (ref 65–99)
GLUCOSE-CAPILLARY: 234 mg/dL — AB (ref 65–99)
Glucose-Capillary: 299 mg/dL — ABNORMAL HIGH (ref 65–99)
Glucose-Capillary: 193 mg/dL — ABNORMAL HIGH (ref 65–99)

## 2017-11-25 LAB — PHOSPHORUS: Phosphorus: 4.4 mg/dL (ref 2.5–4.6)

## 2017-11-25 LAB — MAGNESIUM: Magnesium: 2.2 mg/dL (ref 1.7–2.4)

## 2017-11-25 LAB — HEPATITIS B SURFACE ANTIBODY,QUALITATIVE: Hep B S Ab: NONREACTIVE

## 2017-11-25 LAB — HEPATITIS B SURFACE ANTIGEN: HEP B S AG: NEGATIVE

## 2017-11-25 LAB — HEPATITIS B CORE ANTIBODY, TOTAL: Hep B Core Total Ab: NEGATIVE

## 2017-11-25 NOTE — Progress Notes (Signed)
Palliative:  I met today with Takuya again. His father and stepmother at bedside. We discussed dialysis and although he tolerated this well he is exhausted and completely wiped out today. He is lying in bed and eyes are heavy whereas he is usually in chair or sitting on side of bed when I visit.   We further talked about dialysis and QOL. His frame of mind comes from 12 years in the [employer] and that "you don't just give up and die while bullets are going by." He views dialysis as something he has to do as long as his body tolerates physically and anything else would be "giving up." I discussed that most people have their limits and if they find that they are just existing without quality that sometimes they decide certain therapies like dialysis is not worth continuing for them. He struggles to understand this concept and comfort as an option. However, he does admit to me before I leave "I can't see myself doing dialysis the rest of my life."   Spent some time discussing his time in the [employer] and building rapport. I will continue to follow and discuss with Marjory Lies. Wife was not at bedside today.   36 min  Vinie Sill, NP Palliative Medicine Team Pager # [phone] (M-F 8a-5p) Team Phone # [phone] (Nights/Weekends)

## 2017-11-25 NOTE — Progress Notes (Addendum)
S:tolerated HD yesterday but feels wiped out today O:BP (!) 98/58 (BP Location: Left Arm)   Pulse 88   Temp 98.3 F (36.8 C) (Oral)   Resp 18   Ht 5\' 6"  (1.676 m)   Wt 81.3 kg (179 lb 3.7 oz)   SpO2 100%   BMI 28.93 kg/m   Intake/Output Summary (Last 24 hours) at 11/25/2017 1250 Last data filed at 11/25/2017 0441 Gross per 24 hour  Intake 617 ml  Output 2008 ml  Net -1391 ml   Intake/Output: I/O last 3 completed shifts: In: 857 [P.O.:480; I.V.:3; IV Piggyback:374] Out: 2008 [Other:2008]  Intake/Output this shift:  No intake/output data recorded. Weight change: 0.1 kg (3.5 oz) Gen: NAD CVS: no rub Resp: decreased BS at bases [id number]:[id number], +BS Ext: 2+ edema  Recent Labs  Lab 11/19/17 0410 11/19/17 1611 11/20/17 0437 11/20/17 1528 11/22/17 0430 11/23/17 1533 11/23/17 1534 11/24/17 0429 11/25/17 0857  NA 139 139 138 138 139  --  141 141 140  K 4.2 4.0 4.5 4.7 4.2  --  4.2 4.0 4.2  CL 104 103 103 103 104  --  104 105 104  CO2 25 25 25 24 24   --  19* 21* 23  GLUCOSE 248* 123* 125* 138* 138*  --  296* 133* 203*  BUN 9 8 6  5* 13  --  22* 24* 9  CREATININE 1.73* 1.59* 1.46* 1.46* 3.54*  --  5.68* 6.08* 4.19*  ALBUMIN 3.2* 2.8* 2.7* 2.9* 2.9*  --  3.4* 3.1*  --   CALCIUM 8.5* 8.3* 8.2* 8.4* 8.6*  --  9.0 8.7* 8.6*  PHOS 1.8* 2.3* 2.3* 2.3* 3.6 5.2*  --  4.9*  5.0* 4.4  AST 77*  --   --   --  86*  --  70*  --   --   ALT 33  --   --   --  38  --  37  --   --    Liver Function Tests: Recent Labs  Lab 11/19/17 0410  11/22/17 0430 11/23/17 1534 11/24/17 0429  AST 77*  --  86* 70*  --   ALT 33  --  38 37  --   ALKPHOS 158*  --  144* 141*  --   BILITOT 11.9*  --  13.2* 11.6*  --   PROT 6.7  --  6.4* 6.8  --   ALBUMIN 3.2*   < > 2.9* 3.4* 3.1*   < > = values in this interval not displayed.   No results for input(s): LIPASE, AMYLASE in the last 168 hours. Recent Labs  Lab 11/20/17 0438 11/21/17 0427 11/22/17 0430  AMMONIA 48* 45* 51*   CBC: Recent Labs  Lab  11/20/17 0636 11/21/17 0429 11/22/17 0430 11/23/17 1533 11/24/17 0429  WBC 20.7* 22.2* 16.9* 12.4* 8.0  NEUTROABS  --   --  13.3* 9.5* 6.8  HGB 9.4* 8.7* 8.6* 8.4* 9.0*  HCT 30.3* 29.8* 28.9* 28.8* 29.6*  MCV 120.7* 122.6* 122.5* 122.0* 121.8*  PLT 20* 35* 36* 39* 38*   Cardiac Enzymes: No results for input(s): CKTOTAL, CKMB, CKMBINDEX, TROPONINI in the last 168 hours. CBG: Recent Labs  Lab 11/24/17 0818 11/24/17 1303 11/24/17 2124 11/25/17 0910 11/25/17 1210  GLUCAP 172* 185* 160* 95 234*    Iron Studies: No results for input(s): IRON, TIBC, TRANSFERRIN, FERRITIN in the last 72 hours. Studies/Results: No results found. . chlorhexidine  15 mL Mouth Rinse BID  . Chlorhexidine Gluconate Cloth  6  each Topical Daily  . famotidine  20 mg Oral QHS  . feeding supplement (NEPRO CARB STEADY)  237 mL Oral BID BM  . folic acid  1 mg Oral Daily  . insulin aspart  0-15 Units Subcutaneous TID AC & HS  . lactulose  20 g Per Tube TID   Or  . lactulose  300 mL Rectal TID  . lidocaine  1 patch Transdermal Q24H  . mouth rinse  15 mL Mouth Rinse q12n4p  . methocarbamol  500 mg Oral TID  . midodrine  10 mg Oral TID WC  . octreotide  200 mcg Subcutaneous BID  . QUEtiapine  25 mg Oral QHS  . rifaximin  550 mg Oral BID  . sodium chloride flush  10-40 mL Intracatheter Q12H  . thiamine  100 mg Oral Daily    BMET    Component Value Date/Time   NA 140 11/25/2017 0857   NA 137 10/22/2017 1542   K 4.2 11/25/2017 0857   CL 104 11/25/2017 0857   CO2 23 11/25/2017 0857   GLUCOSE 203 (H) 11/25/2017 0857   BUN 9 11/25/2017 0857   BUN 5 (L) 10/22/2017 1542   CREATININE 4.19 (H) 11/25/2017 0857   CALCIUM 8.6 (L) 11/25/2017 0857   GFRNONAA 15 (L) 11/25/2017 0857   GFRAA 17 (L) 11/25/2017 0857   CBC    Component Value Date/Time   WBC 8.0 11/24/2017 0429   RBC 2.43 (L) 11/24/2017 0429   HGB 9.0 (L) 11/24/2017 0429   HGB 13.1 10/22/2017 1542   HCT 29.6 (L) 11/24/2017 0429   HCT 40.3  10/22/2017 1542   PLT 38 (L) 11/24/2017 0429   PLT 107 (L) 10/22/2017 1542   MCV 121.8 (H) 11/24/2017 0429   MCV 110 (H) 10/22/2017 1542   MCH 37.0 (H) 11/24/2017 0429   MCHC 30.4 11/24/2017 0429   RDW 17.1 (H) 11/24/2017 0429   RDW 15.1 10/22/2017 1542   LYMPHSABS 0.8 11/24/2017 0429   LYMPHSABS 1.4 10/22/2017 1542   MONOABS 0.4 11/24/2017 0429   EOSABS 0.0 11/24/2017 0429   EOSABS 0.0 10/22/2017 1542   BASOSABS 0.0 11/24/2017 0429   BASOSABS 0.1 10/22/2017 1542   Brief HPI: 57 year old man with a history of diabetes Parkinson compression fractures, CAD,and alcohol abuse. He continued to drink 1 bottle of wine daily prior to admission.He presented to the ED with 2 weeks of increasing edema abdominal swelling and lethargy. Hypoglycemic due to ongoing insulin use. Intubated for airway protectionwith hypotension. Treated empirically for presumed SBP although quantity of ascites was notsufficient to tap. He developed worsening renal failure and started on CVVHD 3/26-3/29/19. He has been extubated.  Assessment/Plan:  1. AKI- in setting of decompensated cirrhosis, hypotension, presumably due to SBP and started on CVVHD 3/26-3/29/19. Also had diarrhea and has been negative with I's/O's by 5 liters since admission. No significant UOP to date. Hopefully this is related to ischemic ATN, although hepatorenal remains on the DDx. He is not a candidate for liver transplant due to ongoing alcohol consumption at the time of admission. Currently on midodrine and octreotide.  1. Remains anuric/oliguric 2. Will attempt HD again tomorrow with longer treatment time and more UF. 3. I again spoke with patient and his family that if he is not able to tolerate intermittent HD that there is nothing more we can do for him.  If this is hepatorenal syndrome, will need to get palliative care involved as he will not do well. Not sure he would  be able to tolerate intermittent HD with his low BP despite  midodrine. 4. Will consult IR for tunneled HD catheter placement 2. Alcoholic cirrhosis with encephalopathy- also with rising bilirubin. On lactulose 3. Thrombocytopenia- due to #2 4. Alcohol abuse- will need rehab and ongoing treatment to prevent withdrawal.  5. Disposition- prognosis remains poor and appreciate palliative care's help to set goals/limits of care.  Irena Cords, MD [employer] [phone]

## 2017-11-25 NOTE — Progress Notes (Signed)
  Speech Language Pathology Treatment: Dysphagia  Patient Details Name: [name] P Campione MRN: [id number] DOB: 07/27/1961 Today's Date: 11/25/2017 Time: 1350-1410 SLP Time Calculation (min) (ACUTE ONLY): 20 min  Assessment / Plan / Recommendation Clinical Impression  Pt resting in bed with family present upon arrival of SLP. Pt reports no difficulty with swallowing, and did not exhibit overt s/s aspiration with po trials. MBS results were reviewed with pt/family and opportunity was given to ask questions. Will advance diet to regular/thin liquid and continue to assess tolerance and provide education.   HPI HPI: Pt is a 57 yo male who presented with decompensated cirrhosis with hepatic encephalopathy. PMHx includes DM, PTSD, compression fx's and pain/ debilitation, GERD, EtOH abuse with cirrhosis. Pt recently hospitalized in Feb 2019 for flu. Previously seen by SLP in 2016 following intubation, with FEES revealing reversible dysphagia and pt was eventually d/c from SLP on regular solid and thin liquid diet. Intubated 3/17-3/20 and re-intubated 3/25-3/28. Pt currently on CRRT and nasal cannula      SLP Plan  Continue with current plan of care       Recommendations  Diet recommendations: Regular;Thin liquid Liquids provided via: Straw;Cup Medication Administration: Crushed with puree Supervision: Staff to assist with self feeding;Patient able to self feed Compensations: Minimize environmental distractions;Slow rate;Small sips/bites Postural Changes and/or Swallow Maneuvers: Seated upright 90 degrees                Oral Care Recommendations: Oral care BID Follow up Recommendations: None SLP Visit Diagnosis: Dysphagia, oropharyngeal phase (R13.12) Plan: Continue with current plan of care       GO               [name], [facility]ontMSP, CCC-SLP Speech Language Pathologist [phone]  [name], [name] 11/25/2017, 2:27 PM

## 2017-11-25 NOTE — Progress Notes (Signed)
PROGRESS NOTE  LENNARD CAPEK WUJ:[id number] DOB: [birth year] DOA: 11/07/2017 PCP: Dettinger, Elige Radon, MD  HPI/Recap of past 24 hours:  [name] is a 57 y.o. year old male with medical history significant for alcoholic cirrhosis, T2DM, PTSD, and chronic back pain who presented on 11/07/2017 with encephalopathy related to decompensated cirrhosis requiring admission to critical care team; he was transferred to [facility] service on 11/23/17. Prolonged hospital course included intubation on 3/16 (extubated 3/20) due to hypoxia and somnolence, recurrent hypoglycemia requiring IV dextrose, severe cellulitis of lower extremities treated with vancomycin during time in critical care. No urine output with AKI prompted discontinuation of Vancomyin( 3/20). Nephrology started trial of middorine, octreotide, and albumin on 3/23.  Palliative care has been following, patient is currently limited code ( no CPR or intubation, pressors only)      Interval History No complaints overnight. Tolerated short HD session with 2 L removed and maintained normal hemodynamics  Assessment/Plan: Active Problems:   Hypoxia   Decompensation of cirrhosis of liver (HCC)   Acute renal failure (ARF) (HCC)   Goals of care, counseling/discussion   Palliative care by specialist   #Anuric Acute renal failure- most likely HRS related to decompensated cirrhosis. Tolerating intermittent HD ( only 3 hours yesterday) plan to increase duration and dose on tomorrow per nephrology. On iV albumin, midodrine, octreotide palliative on board given poor prognosis if unable to tolerated HD  #Decompensated Alcoholic cirrhosis with hepatic encephalopathy, improving. Mentating well, no asterixison current lactulose & rifaximin regimen ( 20 g TID) F/u hepatic function tomorrow to monitor elevated bilirubin and AST, likely alcoholic hepatitis.   #Thrombocytopenia- stable at 38 over the last few days. No signs or symptoms of bleeding. Nadir of 15  during admission. Monitor on CBC  #Type 2 DM- A1C 7.6 ( 2014), sliding scale, monitor on CBG   #Alcohol Abuse. Discussed importance of cessation with family and patient. Folic and thiamine supplementation. Seroquel  #Acute hypoxic respiratory failure s/ 2 intubations, improved. Currently on nasal canula. Most likely related to poor mentation in setting of decompensated cirrhosis/hepatic encephalopathy and worsening renal failure with metabolic derangements  #Chronic back pain, continue oxycodone PRN q4h severe pain, and robaxin  Code Status: Partial Code ( NO CPR or intubation, pressors allowed)   Family Communication: Wife, father present and updated at bedside  Disposition Plan: monitor ability to tolerate intermittent HD, will need PT/OT evals   Consultants:  Pulmonology  Nephrology  Gastroenterology  Palliative Care    Antimicrobials: Ceftriaxone 3/16 >>3.22 Vancomycin 3/17 >>3/18 Zosyn 3/24 >>3/30   Telemetry: Yes  DVT prophylaxis:  Heparin   Objective: Vitals:   11/24/17 1948 11/24/17 2018 11/25/17 0439 11/25/17 0532  BP: 117/67 107/61 (!) 98/58   Pulse: 70 (!) 58 88   Resp: 20 18 18    Temp: 98 F (36.7 C) 98.2 F (36.8 C) 98.3 F (36.8 C)   TempSrc: Oral Oral Oral   SpO2: 98% 97% 100%   Weight: 81.4 kg (179 lb 7.3 oz)   81.3 kg (179 lb 3.7 oz)  Height:        Intake/Output Summary (Last 24 hours) at 11/25/2017 1301 Last data filed at 11/25/2017 0441 Gross per 24 hour  Intake 617 ml  Output 2008 ml  Net -1391 ml   Filed Weights   11/24/17 1600 11/24/17 1948 11/25/17 0532  Weight: 83.4 kg (183 lb 13.8 oz) 81.4 kg (179 lb 7.3 oz) 81.3 kg (179 lb 3.7 oz)    Exam:  Constitutional:chronically ill appearing male Eyes: EOMI, scleral icterus, normal conjunctivae Cardiovascular: RRR no MRGs, with 1+ pitting edema Respiratory: Normal respiratory effort on 2 L Jerome, clear breath sounds  Abdomen:Distended, slightly tender, with no appreciable  HSM Skin: skin breakdown with dressing on lower leg. Without skin tenting  Neurologic: Grossly no focal neuro deficit.No asterixs Psychiatric:Appropriate affect, and mood. Mental status AAOx3  Data Reviewed: CBC: Recent Labs  Lab 11/20/17 0636 11/21/17 0429 11/22/17 0430 11/23/17 1533 11/24/17 0429  WBC 20.7* 22.2* 16.9* 12.4* 8.0  NEUTROABS  --   --  13.3* 9.5* 6.8  HGB 9.4* 8.7* 8.6* 8.4* 9.0*  HCT 30.3* 29.8* 28.9* 28.8* 29.6*  MCV 120.7* 122.6* 122.5* 122.0* 121.8*  PLT 20* 35* 36* 39* 38*   Basic Metabolic Panel: Recent Labs  Lab 11/20/17 1528 11/21/17 0429 11/22/17 0430 11/23/17 1533 11/23/17 1534 11/24/17 0429 11/25/17 0857  NA 138  --  139  --  141 141 140  K 4.7  --  4.2  --  4.2 4.0 4.2  CL 103  --  104  --  104 105 104  CO2 24  --  24  --  19* 21* 23  GLUCOSE 138*  --  138*  --  296* 133* 203*  BUN 5*  --  13  --  22* 24* 9  CREATININE 1.46*  --  3.54*  --  5.68* 6.08* 4.19*  CALCIUM 8.4*  --  8.6*  --  9.0 8.7* 8.6*  MG  --  2.3 2.6* 2.8*  --  2.7* 2.2  PHOS 2.3*  --  3.6 5.2*  --  4.9*  5.0* 4.4   GFR: Estimated Creatinine Clearance: 19.9 mL/min (A) (by C-G formula based on SCr of 4.19 mg/dL (H)). Liver Function Tests: Recent Labs  Lab 11/19/17 0410  11/20/17 0437 11/20/17 1528 11/22/17 0430 11/23/17 1534 11/24/17 0429  AST 77*  --   --   --  86* 70*  --   ALT 33  --   --   --  38 37  --   ALKPHOS 158*  --   --   --  144* 141*  --   BILITOT 11.9*  --   --   --  13.2* 11.6*  --   PROT 6.7  --   --   --  6.4* 6.8  --   ALBUMIN 3.2*   < > 2.7* 2.9* 2.9* 3.4* 3.1*   < > = values in this interval not displayed.   No results for input(s): LIPASE, AMYLASE in the last 168 hours. Recent Labs  Lab 11/19/17 0410 11/20/17 0438 11/21/17 0427 11/22/17 0430  AMMONIA 44* 48* 45* 51*   Coagulation Profile: Recent Labs  Lab 11/19/17 0410 11/20/17 0437 11/21/17 0429 11/22/17 0945  INR 1.48 1.45 1.40 1.45   Cardiac Enzymes: No results for  input(s): CKTOTAL, CKMB, CKMBINDEX, TROPONINI in the last 168 hours. BNP (last 3 results) No results for input(s): PROBNP in the last 8760 hours. HbA1C: No results for input(s): HGBA1C in the last 72 hours. CBG: Recent Labs  Lab 11/24/17 0818 11/24/17 1303 11/24/17 2124 11/25/17 0910 11/25/17 1210  GLUCAP 172* 185* 160* 95 234*   Lipid Profile: No results for input(s): CHOL, HDL, LDLCALC, TRIG, CHOLHDL, LDLDIRECT in the last 72 hours. Thyroid Function Tests: No results for input(s): TSH, T4TOTAL, FREET4, T3FREE, THYROIDAB in the last 72 hours. Anemia Panel: No results for input(s): VITAMINB12, FOLATE, FERRITIN, TIBC, IRON, RETICCTPCT in the last 72  hours. Urine analysis:    Component Value Date/Time   COLORURINE YELLOW 11/11/2017 1205   APPEARANCEUR TURBID (A) 11/11/2017 1205   LABSPEC 1.025 11/11/2017 1205   PHURINE 6.5 11/11/2017 1205   GLUCOSEU NEGATIVE 11/11/2017 1205   HGBUR LARGE (A) 11/11/2017 1205   BILIRUBINUR LARGE (A) 11/11/2017 1205   BILIRUBINUR n 02/28/2011 1247   KETONESUR NEGATIVE 11/11/2017 1205   PROTEINUR >300 (A) 11/11/2017 1205   UROBILINOGEN 1.0 01/17/2015 1305   NITRITE NEGATIVE 11/11/2017 1205   LEUKOCYTESUR MODERATE (A) 11/11/2017 1205   Sepsis Labs: @LABRCNTIP (procalcitonin:4,lacticidven:4)  ) Recent Results (from the past 240 hour(s))  Culture, blood (routine x 2)     Status: None   Collection Time: 11/15/17  1:57 PM  Result Value Ref Range Status   Specimen Description BLOOD LEFT HAND  Final   Special Requests   Final    BOTTLES DRAWN AEROBIC ONLY Blood Culture adequate volume   Culture   Final    NO GROWTH 5 DAYS Performed at [facility], [address] [place], [place] [address]    Report Status 11/20/2017 FINAL  Final  Culture, blood (routine x 2)     Status: None   Collection Time: 11/15/17  2:37 PM  Result Value Ref Range Status   Specimen Description BLOOD LEFT HAND  Final   Special Requests IN PEDIATRIC BOTTLE Blood  Culture adequate volume  Final   Culture   Final    NO GROWTH 5 DAYS Performed at [facility], [address] [place], [place] [address]    Report Status 11/20/2017 FINAL  Final      Studies: No results found.  Scheduled Meds: . chlorhexidine  15 mL Mouth Rinse BID  . Chlorhexidine Gluconate Cloth  6 each Topical Daily  . famotidine  20 mg Oral QHS  . feeding supplement (NEPRO CARB STEADY)  237 mL Oral BID BM  . folic acid  1 mg Oral Daily  . insulin aspart  0-15 Units Subcutaneous TID AC & HS  . lactulose  20 g Per Tube TID   Or  . lactulose  300 mL Rectal TID  . lidocaine  1 patch Transdermal Q24H  . mouth rinse  15 mL Mouth Rinse q12n4p  . methocarbamol  500 mg Oral TID  . midodrine  10 mg Oral TID WC  . octreotide  200 mcg Subcutaneous BID  . QUEtiapine  25 mg Oral QHS  . rifaximin  550 mg Oral BID  . sodium chloride flush  10-40 mL Intracatheter Q12H  . thiamine  100 mg Oral Daily    Continuous Infusions: . sodium chloride Stopped (11/20/17 0900)  . sodium chloride    . sodium chloride    . albumin human       LOS: 18 days     Laverna PeaceShayla D Tuck Dulworth, MD [facility] Pager [phone]  If 7PM-7AM, please contact night-coverage [email or web address] Password [email or web address] 11/25/2017, 1:01 PM

## 2017-11-26 DIAGNOSIS — R140 Abdominal distension (gaseous): Secondary | ICD-10-CM

## 2017-11-26 LAB — HEPATIC FUNCTION PANEL
ALBUMIN: 3.5 g/dL (ref 3.5–5.0)
ALT: 30 U/L (ref 17–63)
AST: 73 U/L — AB (ref 15–41)
Alkaline Phosphatase: 122 U/L (ref 38–126)
BILIRUBIN TOTAL: 9.1 mg/dL — AB (ref 0.3–1.2)
Bilirubin, Direct: 4.9 mg/dL — ABNORMAL HIGH (ref 0.1–0.5)
Indirect Bilirubin: 4.2 mg/dL — ABNORMAL HIGH (ref 0.3–0.9)
Total Protein: 6.6 g/dL (ref 6.5–8.1)

## 2017-11-26 LAB — CBC
HEMATOCRIT: 29.6 % — AB (ref 39.0–52.0)
Hemoglobin: 9.1 g/dL — ABNORMAL LOW (ref 13.0–17.0)
MCH: 36.7 pg — ABNORMAL HIGH (ref 26.0–34.0)
MCHC: 30.7 g/dL (ref 30.0–36.0)
MCV: 119.4 fL — AB (ref 78.0–100.0)
PLATELETS: 52 10*3/uL — AB (ref 150–400)
RBC: 2.48 MIL/uL — AB (ref 4.22–5.81)
RDW: 16.2 % — ABNORMAL HIGH (ref 11.5–15.5)
WBC: 6.9 10*3/uL (ref 4.0–10.5)

## 2017-11-26 LAB — GLUCOSE, CAPILLARY
GLUCOSE-CAPILLARY: 178 mg/dL — AB (ref 65–99)
Glucose-Capillary: 217 mg/dL — ABNORMAL HIGH (ref 65–99)

## 2017-11-26 LAB — RENAL FUNCTION PANEL
ANION GAP: 16 — AB (ref 5–15)
Albumin: 3.5 g/dL (ref 3.5–5.0)
BUN: 15 mg/dL (ref 6–20)
CHLORIDE: 103 mmol/L (ref 101–111)
CO2: 21 mmol/L — ABNORMAL LOW (ref 22–32)
Calcium: 8.6 mg/dL — ABNORMAL LOW (ref 8.9–10.3)
Creatinine, Ser: 5.3 mg/dL — ABNORMAL HIGH (ref 0.61–1.24)
GFR, EST AFRICAN AMERICAN: 13 mL/min — AB (ref >60)
GFR, EST NON AFRICAN AMERICAN: 11 mL/min — AB (ref >60)
Glucose, Bld: 142 mg/dL — ABNORMAL HIGH (ref 65–99)
POTASSIUM: 3.9 mmol/L (ref 3.5–5.1)
Phosphorus: 3.8 mg/dL (ref 2.5–4.6)
Sodium: 140 mmol/L (ref 135–145)

## 2017-11-26 LAB — PROTIME-INR
INR: 1.64
Prothrombin Time: 19.3 seconds — ABNORMAL HIGH (ref 11.4–15.2)

## 2017-11-26 MED ORDER — PRO-STAT SUGAR FREE PO LIQD
30.0000 mL | Freq: Two times a day (BID) | ORAL | Status: DC
  Administered 2017-11-27 – 2017-12-01 (×7): 30 mL via ORAL
  Filled 2017-11-26 (×8): qty 30

## 2017-11-26 MED ORDER — BOOST / RESOURCE BREEZE PO LIQD CUSTOM
1.0000 | Freq: Two times a day (BID) | ORAL | Status: DC
  Administered 2017-11-26: 1 via ORAL
  Administered 2017-11-30: 237 mL via ORAL

## 2017-11-26 NOTE — Progress Notes (Signed)
Inpatient Diabetes Program Recommendations  AACE/ADA: New Consensus Statement on Inpatient Glycemic Control (2015)  Target Ranges:  Prepandial:   less than 140 mg/dL      Peak postprandial:   less than 180 mg/dL (1-2 hours)      Critically ill patients:  140 - 180 mg/dL   Lab Results  Component Value Date   GLUCAP 193 (H) 11/25/2017   HGBA1C 7.6 (H) 04/09/2013    Review of Glycemic Control Results for [name], [name] (MRN [id number]) as of 11/26/2017 11:52  Ref. Range 11/25/2017 09:10 11/25/2017 12:10 11/25/2017 17:44 11/25/2017 20:41  Glucose-Capillary Latest Ref Range: 65 - 99 mg/dL 95 742234 (H) 595299 (H) 638193 (H)   Diabetes history: Type 2 DM Outpatient Diabetes medications: Novolog 9-17 units QID, Lantus 33 units QD Current orders for Inpatient glycemic control: Novolog 0-15 units [facility]  Inpatient Diabetes Program Recommendations:    Noted elevated post prandials >180 mg/dL.   Spoke with nurse to confirm that patient is eating atleast 2 meals per day at atleast 50%. At this time would recommend Novolog 3 units of meal coverage (assuming patient consumes atleast 50% of meal, under glycemic control order set).  Thanks, Lujean RaveLauren Webber Michiels, MSN, RNC-OB Diabetes Coordinator [phone] (8a-5p)

## 2017-11-26 NOTE — Progress Notes (Signed)
I was present at this dialysis session. I have reviewed the session itself and made appropriate changes.   Filed Weights   11/24/17 1948 11/25/17 0532 11/26/17 0500  Weight: 81.4 kg (179 lb 7.3 oz) 81.3 kg (179 lb 3.7 oz) 77.7 kg (171 lb 4.8 oz)    Recent Labs  Lab 11/25/17 0857  NA 140  K 4.2  CL 104  CO2 23  GLUCOSE 203*  BUN 9  CREATININE 4.19*  CALCIUM 8.6*  PHOS 4.4    Recent Labs  Lab 11/22/17 0430 11/23/17 1533 11/24/17 0429  WBC 16.9* 12.4* 8.0  NEUTROABS 13.3* 9.5* 6.8  HGB 8.6* 8.4* 9.0*  HCT 28.9* 28.8* 29.6*  MCV 122.5* 122.0* 121.8*  PLT 36* 39* 38*    Scheduled Meds: . chlorhexidine  15 mL Mouth Rinse BID  . Chlorhexidine Gluconate Cloth  6 each Topical Daily  . famotidine  20 mg Oral QHS  . feeding supplement (NEPRO CARB STEADY)  237 mL Oral BID BM  . folic acid  1 mg Oral Daily  . insulin aspart  0-15 Units Subcutaneous TID AC & HS  . lactulose  20 g Per Tube TID   Or  . lactulose  300 mL Rectal TID  . lidocaine  1 patch Transdermal Q24H  . mouth rinse  15 mL Mouth Rinse q12n4p  . methocarbamol  500 mg Oral TID  . midodrine  10 mg Oral TID WC  . octreotide  200 mcg Subcutaneous BID  . QUEtiapine  25 mg Oral QHS  . rifaximin  550 mg Oral BID  . sodium chloride flush  10-40 mL Intracatheter Q12H  . thiamine  100 mg Oral Daily   Continuous Infusions: . sodium chloride Stopped (11/20/17 0900)  . sodium chloride    . sodium chloride    . albumin human     PRN Meds:.sodium chloride, sodium chloride, sodium chloride, albuterol, alteplase, heparin, heparin, lidocaine (PF), lidocaine-prilocaine, oxyCODONE, pentafluoroprop-tetrafluoroeth    Assessment/Plan:  1. AKI- in setting of decompensated cirrhosis, hypotension, presumably due to SBP and started on CVVHD 3/26-3/29/19. Also had diarrhea and has been negative with I's/O's by 5 liters since admission. No significant UOP to date. Hopefully this is related to ischemic ATN, although hepatorenal  remains on the DDx. He is not a candidate for liver transplant due to ongoing alcohol consumption at the time of admission. Currently on midodrine and octreotide. 1. Remains anuric/oliguric 2. Will attemptHDagain tomorrow with longer treatment time and more UF. 3. I again spoke with patient and his family that if he is not able to tolerate intermittent HD that there is nothing more we can do for him.If this is hepatorenalsyndrome, will need to get palliative care involved as he will not do well. Not sure he would be able to tolerate intermittent HD with his low BP despite midodrine. 4. Will consult IR for tunneled HD catheter placement 2. Alcoholic cirrhosis with encephalopathy- also with rising bilirubin. On lactulose 3. Thrombocytopenia- due to #2 4. Alcohol abuse- will need rehab and ongoing treatment to prevent withdrawal.  5. Disposition- prognosis remains poor and appreciate palliative care's help to set goals/limits of care.  If he continues to do well with HD will start CLIP process and ask [facility] for permanent access placement.   Irena CordsJoseph A Nicholous Girgenti,  MD 11/26/2017, 8:31 AM

## 2017-11-26 NOTE — Progress Notes (Addendum)
Palliative:  [name] has gone to dialysis but his wife, father, stepmother in room. Wife had many questions for me this morning. She asks about the severity of his liver disease and I explained that this is end stage and the fact that his kidneys are not functioning is a very, very poor sign of how he will do. I explained that patients with liver and kidney failure usually do not do well for very long unfortunately and this is why we have been so concerned with how he will physically tolerate dialysis and for how long. She is saddened but appreciative of the information.   Late entry 1400: [name] is back from HD but absolutely miserable. He is in pain that was exacerbated by HD. We again discussed how miserable he is with dialysis and that we really only have 2 options: to keep pushing forward and recognize this will not be easy and there will be suffering involved vs stopping dialysis and focusing on comfort and pain control. Explained that we are limited with pain control d/t hypotension and his goal to continue dialysis currently. We also discussed that there will likely be a time when his body will not tolerate further dialysis and this decision may be made for him. He continues to tell me that although he is miserable he "cannot give up" because "I'm not a quitter." We discussed the difference in giving up and choosing the lesser of two evils of quality of life over quantity of life. I compared this to a cancer patient with terminal cancer (his liver and renal diseases are terminal in nature) who stops chemotherapy to focus on quality of life over quantity and how they want to live the rest of their life.   He is also resistant and refusing octreotide SQ from RN saying it hurts. After ~10 minutes of convincing from RN and family he accepted octreotide reluctantly. I explained to him that he does not have to accept octreotide, or any other treatment/interventions, BUT all these decisions do have consequences.  I explained that we will not make him do anything and that he has to be upfront and honest about his wishes. Encouraged him that he has a loving and supporting family that will support what is best for him. Emotional support provided.   60 min  Yong ChannelAlicia Roniyah Llorens, NP Palliative Medicine Team Pager # [phone] (M-F 8a-5p) Team Phone # [phone] (Nights/Weekends)

## 2017-11-26 NOTE — Progress Notes (Signed)
Nutrition Follow-up  DOCUMENTATION CODES:   Not applicable  INTERVENTION:   -D/c Nepro Shake po BID, each supplement provides 425 kcal and 19 grams protein -Boost Breeze po BID, each supplement provides 250 kcal and 9 grams of protein -30 ml Prostat BID, each supplement provides 100 kcals and 15 grams protein  NUTRITION DIAGNOSIS:   Inadequate oral intake related to lethargy/confusion as evidenced by meal completion < 50%.  Ongoing  GOAL:   Patient will meet greater than or equal to 90% of their needs  Unmet  MONITOR:   PO intake, Supplement acceptance, Labs, I & O's  REASON FOR ASSESSMENT:   Ventilator, Consult Enteral/tube feeding initiation and management  ASSESSMENT:   57 yo male with PMH of GERD, osteoporosis, vitamin D deficiency, hepatomegaly, alcohol abuse, pancreatitis, DM, compression fracture, Legionnaire's DZ, PTSD who was admitted on 3/16 with decompensated cirrhosis with hepatic encephalopathy, hypoglycemia. Required intubation on 3/17.  3/30- s/p MBSS- advanced to dysphagia 3 diet with thin liquids, transferred from ICU to floor 4/1- rectal tube removed  Reviewed I/O's: +487 ml x 24 hours and -6.7 L since 11/12/17  Pt unavailable at time of visit.   Pt advanced to a carb modified diet yesterday. Meal completion poor (PO: 25-50%). He refusing Nepro supplement. Per MD notes, complains of being bloated. Pt refusing Nepro supplement.   Pt and family working with palliative care team regarding goals of care. Pt unsure if he wants to continue with HD long term (apparently was "wiped out" after last treatment.   Medications reviewed and include lactulose, folvite, and vitamin B-1.  Labs reviewed: CBGS: 193-299 (inpatient orders for glycemic control are 0-15 units insulin aspart TID with meals and q HS).  Diet Order:  Diet Carb Modified Fluid consistency: Thin; Room service appropriate? Yes Diet NPO time specified Except for: Sips with Meds  EDUCATION  NEEDS:   No education needs have been identified at this time  Skin:  Skin Assessment: Skin Integrity Issues: Skin Integrity Issues:: Other (Comment) Other: venous stasis/non pressure injury ulcers bilateral extremities  Last BM:  11/25/17  Height:   Ht Readings from Last 1 Encounters:  11/22/17 5\' 6"  (1.676 m)    Weight:   Wt Readings from Last 1 Encounters:  11/26/17 164 lb 0.4 oz (74.4 kg)    Ideal Body Weight:  70 kg  BMI:  Body mass index is 26.47 kg/m.  Estimated Nutritional Needs:   Kcal:  2100-2300  Protein:  115-130 gm  Fluid:  1.2 L    Hiro Vipond A. Mayford KnifeWilliams, RD, LDN, CDE Pager: [phone] After hours Pager: [phone]

## 2017-11-26 NOTE — Progress Notes (Signed)
PROGRESS NOTE  [name] ZOX:[id number] DOB: December 09, 1960 DOA: 11/07/2017 PCP: Dettinger, Elige Radon, MD  HPI/Recap of past 24 hours:  [name] is a 57 y.o. year old male with medical history significant for alcoholic cirrhosis, T2DM, PTSD, and chronic back pain who presented on 11/07/2017 with encephalopathy related to decompensated cirrhosis requiring admission to critical care team; he was transferred to [facility] service on 11/23/17. Prolonged hospital course included intubation on 3/16 (extubated 3/20) due to hypoxia and somnolence, recurrent hypoglycemia requiring IV dextrose, severe cellulitis of lower extremities treated with vancomycin during time in critical care. No urine output with AKI prompted discontinuation of Vancomyin( 3/20). Nephrology started trial of middorine, octreotide, and albumin on 3/23.  Palliative care has been following, patient is currently limited code ( no CPR or intubation, pressors only)    Subjective No complaints overnight. Tolerated longer HD session today with 3.5 L removed. Has some back pain since finishing HD session  Assessment/Plan: Active Problems:   Hypoxia   Acute respiratory failure with hypoxia (HCC)   Decompensation of cirrhosis of liver (HCC)   Acute renal failure (ARF) (HCC)   Goals of care, counseling/discussion   Palliative care by specialist   ESRD (end stage renal disease) (HCC)   Thrombocytopenia (HCC)   Alcohol abuse   #Anuric Acute renal failure- most likely HRS in setting of decompensated cirrhosis. Continues to tolerate intermittent HD with no hypotension. On IV albumin, midodrine, octreotide. Palliative following due to poor prognosis if unable to tolerate HD. IR consulted for tunneled dialysis catheter placement on 4/5  #Decompensated Alcoholic cirrhosis with hepatic encephalopathy, improving. Maintains normal mentation and no asterixis. Continue lactulose ( 20- g TID) and rifaximin regimen.  Mentating well, no asterixison  current lactulose & rifaximin regimen ( 20 g TID)    #Hyperbilirubinemia and Elevated AST, improving. Hepatitis panel on admission. Bilirubin downtrending ( 11-9). Elevated AST, likely alcoholic hepatitis.   #Thrombocytopenia- improving ( 38---52)  No signs or symptoms of bleeding. Nadir of 15 during admission. Monitor on CBC  #Type 2 DM- A1C 7.6 ( 2014), sliding scale, monitor on CBG   #Alcohol Abuse. Discussed importance of cessation with family and patient. Folic and thiamine supplementation. Seroquel  #Acute hypoxic respiratory failure s/p 2 intubations, improving. Still on nasal canula. intubation likely related to inability to protect airway in setting of decompensated cirrhosis/hepatic encephalopathy and metabolic derangements from worsening renal failure earlier in admission. Wean oxygen as tolerates, encourage incentive spirometry.   #Chronic back pain, stable, continue oxycodone PRN q4h severe pain, and robaxin  Code Status: Partial Code ( NO CPR or intubation, pressors allowed)   Family Communication: Wife, updated at bedside  Disposition Plan: monitor ability to tolerate intermittent HD, will need PT/OT evals   Consultants:  Pulmonology  Nephrology  Gastroenterology  Palliative Care  Interventional Radiology  Antimicrobials: Ceftriaxone 3/16 >>3.22 Vancomycin 3/17 >>3/18 Zosyn 3/24 >>3/30   Telemetry: Yes  DVT prophylaxis:  Heparin   Objective: Vitals:   11/26/17 1015 11/26/17 1045 11/26/17 1115 11/26/17 1130  BP: (!) 114/47 102/65 105/66 108/75  Pulse: 74 72 78 77  Resp:    18  Temp:    98.2 F (36.8 C)  TempSrc:    Oral  SpO2:    99%  Weight:    74.4 kg (164 lb 0.4 oz)  Height:        Intake/Output Summary (Last 24 hours) at 11/26/2017 1614 Last data filed at 11/26/2017 1130 Gross per 24 hour  Intake  487 ml  Output 3500 ml  Net -3013 ml   Filed Weights   11/26/17 0500 11/26/17 0800 11/26/17 1130  Weight: 77.7 kg (171 lb 4.8 oz) 77.7 kg  (171 lb 4.8 oz) 74.4 kg (164 lb 0.4 oz)    Exam:  Constitutional:chronically ill appearing male Eyes: EOMI, scleral icterus, normal conjunctivae Cardiovascular: RRR no MRGs, with 1+ pitting edema Respiratory: Normal respiratory effort on room air, clear breath sounds  Abdomen:Distended, soft, non-tender, with no appreciable HSM Skin: skin breakdown with dressing on lower leg. Without skin tenting , Jaundiced Neurologic: Grossly no focal neuro deficit.No asterixs Psychiatric:Appropriate affect, and mood. Mental status AAOx3  Data Reviewed: CBC: Recent Labs  Lab 11/21/17 0429 11/22/17 0430 11/23/17 1533 11/24/17 0429 11/26/17 0648  WBC 22.2* 16.9* 12.4* 8.0 6.9  NEUTROABS  --  13.3* 9.5* 6.8  --   HGB 8.7* 8.6* 8.4* 9.0* 9.1*  HCT 29.8* 28.9* 28.8* 29.6* 29.6*  MCV 122.6* 122.5* 122.0* 121.8* 119.4*  PLT 35* 36* 39* 38* 52*   Basic Metabolic Panel: Recent Labs  Lab 11/21/17 0429 11/22/17 0430 11/23/17 1533 11/23/17 1534 11/24/17 0429 11/25/17 0857 11/26/17 0730  NA  --  139  --  141 141 140 140  K  --  4.2  --  4.2 4.0 4.2 3.9  CL  --  104  --  104 105 104 103  CO2  --  24  --  19* 21* 23 21*  GLUCOSE  --  138*  --  296* 133* 203* 142*  BUN  --  13  --  22* 24* 9 15  CREATININE  --  3.54*  --  5.68* 6.08* 4.19* 5.30*  CALCIUM  --  8.6*  --  9.0 8.7* 8.6* 8.6*  MG 2.3 2.6* 2.8*  --  2.7* 2.2  --   PHOS  --  3.6 5.2*  --  4.9*  5.0* 4.4 3.8   GFR: Estimated Creatinine Clearance: 14.2 mL/min (A) (by C-G formula based on SCr of 5.3 mg/dL (H)). Liver Function Tests: Recent Labs  Lab 11/22/17 0430 11/23/17 1534 11/24/17 0429 11/26/17 0619 11/26/17 0730  AST 86* 70*  --  73*  --   ALT 38 37  --  30  --   ALKPHOS 144* 141*  --  122  --   BILITOT 13.2* 11.6*  --  9.1*  --   PROT 6.4* 6.8  --  6.6  --   ALBUMIN 2.9* 3.4* 3.1* 3.5 3.5   No results for input(s): LIPASE, AMYLASE in the last 168 hours. Recent Labs  Lab 11/20/17 0438 11/21/17 0427  11/22/17 0430  AMMONIA 48* 45* 51*   Coagulation Profile: Recent Labs  Lab 11/20/17 0437 11/21/17 0429 11/22/17 0945 11/26/17 0619  INR 1.45 1.40 1.45 1.64   Cardiac Enzymes: No results for input(s): CKTOTAL, CKMB, CKMBINDEX, TROPONINI in the last 168 hours. BNP (last 3 results) No results for input(s): PROBNP in the last 8760 hours. HbA1C: No results for input(s): HGBA1C in the last 72 hours. CBG: Recent Labs  Lab 11/24/17 2124 11/25/17 0910 11/25/17 1210 11/25/17 1744 11/25/17 2041  GLUCAP 160* 95 234* 299* 193*   Lipid Profile: No results for input(s): CHOL, HDL, LDLCALC, TRIG, CHOLHDL, LDLDIRECT in the last 72 hours. Thyroid Function Tests: No results for input(s): TSH, T4TOTAL, FREET4, T3FREE, THYROIDAB in the last 72 hours. Anemia Panel: No results for input(s): VITAMINB12, FOLATE, FERRITIN, TIBC, IRON, RETICCTPCT in the last 72 hours. Urine analysis:  Component Value Date/Time   COLORURINE YELLOW 11/11/2017 1205   APPEARANCEUR TURBID (A) 11/11/2017 1205   LABSPEC 1.025 11/11/2017 1205   PHURINE 6.5 11/11/2017 1205   GLUCOSEU NEGATIVE 11/11/2017 1205   HGBUR LARGE (A) 11/11/2017 1205   BILIRUBINUR LARGE (A) 11/11/2017 1205   BILIRUBINUR n 02/28/2011 1247   KETONESUR NEGATIVE 11/11/2017 1205   PROTEINUR >300 (A) 11/11/2017 1205   UROBILINOGEN 1.0 01/17/2015 1305   NITRITE NEGATIVE 11/11/2017 1205   LEUKOCYTESUR MODERATE (A) 11/11/2017 1205   Sepsis Labs: @LABRCNTIP (procalcitonin:4,lacticidven:4)  ) No results found for this or any previous visit (from the past 240 hour(s)).    Studies: No results found.  Scheduled Meds: . chlorhexidine  15 mL Mouth Rinse BID  . Chlorhexidine Gluconate Cloth  6 each Topical Daily  . famotidine  20 mg Oral QHS  . feeding supplement  1 Container Oral BID BM  . feeding supplement (PRO-STAT SUGAR FREE 64)  30 mL Oral BID  . folic acid  1 mg Oral Daily  . insulin aspart  0-15 Units Subcutaneous TID AC & HS  .  lactulose  20 g Per Tube TID   Or  . lactulose  300 mL Rectal TID  . lidocaine  1 patch Transdermal Q24H  . mouth rinse  15 mL Mouth Rinse q12n4p  . methocarbamol  500 mg Oral TID  . midodrine  10 mg Oral TID WC  . octreotide  200 mcg Subcutaneous BID  . QUEtiapine  25 mg Oral QHS  . rifaximin  550 mg Oral BID  . sodium chloride flush  10-40 mL Intracatheter Q12H  . thiamine  100 mg Oral Daily    Continuous Infusions: . sodium chloride Stopped (11/20/17 0900)  . sodium chloride    . sodium chloride    . albumin human       LOS: 19 days     Laverna PeaceShayla D Abbigale Mcelhaney, MD [facility] Pager [phone]  If 7PM-7AM, please contact night-coverage [email or web address] Password [email or web address] 11/26/2017, 4:14 PM

## 2017-11-26 NOTE — Progress Notes (Signed)
SLP Cancellation Note  Patient Details Name: [name] MRN: [id number] DOB: 09/01/1960   Cancelled treatment:       Reason Eval/Treat Not Completed: Patient at procedure or test/unavailable. Pt currently in dialysis.  Wife present, but has not seen pt eating current (regular) diet. Will continue efforts.  Raenette Sakata B. Murvin NatalBueche, [facility] Surgery CenterMSP, CCC-SLP Speech Language Pathologist [phone]  Leigh AuroraBueche, Isaia Hassell Brown 11/26/2017, 9:31 AM

## 2017-11-26 NOTE — Progress Notes (Signed)
Pt went to dialysis.

## 2017-11-26 NOTE — Consult Note (Signed)
Chief Complaint: Patient was seen in consultation today for tunneled dialysis catheterplacement Chief Complaint  Patient presents with  . leg swelling/jaundice   at the request of Dr Larose Hires  Supervising Physician: Corrie Mckusick  Patient Status: [facility] - In-pt  History of Present Illness: [name] is a 57 y.o. male   AKI- in setting of decompensated cirrhosis, hypotension, presumably due to SBP and started on CVVHD 3/26-3/29/19. Etoh Cirrhosis Thrombocytopenia Disposition- prognosis remains poor andappreciatepalliative care'shelp toset goals/limits of care.  If he continues to do well with HD will start CLIP process and ask VVS for permanent access placement. Will consult IR for tunneled HD catheter placement  Request for tunneled dialysis catheter per Dr Laretta Alstrom cath placed by [facility] 11/16/17  Scheduled for Tunneled cath in IR 4/5  Past Medical History:  Diagnosis Date  . Chronic lower back pain   . Compression fracture    lumbar 3  . Diabetes mellitus   . Fracture acetabulum-closed ([place]) 04/09/2013  . GERD (gastroesophageal reflux disease)   . H/O Legionnaire's disease   . Hepatomegaly   . Migraine   . Osteoporosis   . Pancreatitis   . PTSD (post-traumatic stress disorder)   . Vitamin D deficiency     Past Surgical History:  Procedure Laterality Date  . BACK SURGERY    . CHOLECYSTECTOMY    . HERNIA REPAIR    . KYPHOPLASTY N/A 07/16/2015   Procedure: Lumbar three kyphoplasty;  Surgeon: Ashok Pall, MD;  Location: [place] [facility];  Service: Neurosurgery;  Laterality: N/A;  Lumbar three kyphoplasty  . LAMINECTOMY    . MASS EXCISION Left 06/12/2017   Procedure: EXCISION LEFT AXILLARY SEBACEOUS CYST;  Surgeon: Coralie Keens, MD;  Location: [place];  Service: General;  Laterality: Left;  . STERNUM FRACTURE SURGERY      Allergies: Ketorolac tromethamine and Temsirolimus  Medications: Prior to Admission medications     Medication Sig Start Date End Date Taking? Authorizing Provider  albuterol (PROVENTIL HFA;VENTOLIN HFA) 108 (90 Base) MCG/ACT inhaler Inhale 2 puffs into the lungs every 6 (six) hours as needed for wheezing or shortness of breath. 10/16/17  Yes Terald Sleeper, PA-C  carboxymethylcellulose (REFRESH PLUS) 0.5 % SOLN Place 1 drop into both eyes 3 (three) times daily as needed (dry eyes).   Yes [provider]  gabapentin (NEURONTIN) 400 MG capsule Take 3 capsules (1,200 mg total) by mouth 3 (three) times daily. 10/22/17  Yes Dettinger, Fransisca Kaufmann, MD  guaiFENesin (MUCINEX) 600 MG 12 hr tablet Take 2 tablets (1,200 mg total) by mouth 2 (two) times daily. 10/18/17  Yes Mariel Aloe, MD  hydrochlorothiazide (HYDRODIURIL) 25 MG tablet Take 0.5 tablets (12.5 mg total) by mouth 2 (two) times daily. 10/22/17  Yes Dettinger, Fransisca Kaufmann, MD  ibuprofen (ADVIL,MOTRIN) 200 MG tablet Take 600 mg by mouth every 6 (six) hours as needed (back pain).   Yes [provider]  insulin aspart (NOVOLOG FLEXPEN) 100 UNIT/ML FlexPen INJECT 7 TO 15 UNITS subcu up to 4 times daily Patient taking differently: INJECT 9 - 17 UNITS SQ UP TO FOUR TIMES A DAY 10/23/17  Yes Dettinger, Fransisca Kaufmann, MD  Insulin Glargine (LANTUS SOLOSTAR) 100 UNIT/ML Solostar Pen Inject 37 Units into the skin daily. Patient taking differently: Inject 33 Units into the skin daily.  10/22/17  Yes Dettinger, Fransisca Kaufmann, MD  Magnesium 500 MG TABS Take 500 mg by mouth daily.   Yes [provider]  methocarbamol (ROBAXIN)  500 MG tablet Take 500 mg by mouth 2 (two) times daily as needed for muscle spasms.   Yes [provider]  morphine (MSIR) 15 MG tablet Take 15 mg by mouth 3 (three) times daily.    Yes [provider]  pantoprazole (PROTONIX) 40 MG tablet Take 1 tablet (40 mg total) by mouth daily. 10/22/17  Yes Dettinger, Fransisca Kaufmann, MD  polyethylene glycol (MIRALAX / GLYCOLAX) packet Take 17 g by mouth 2 (two) times daily.  12/01/16  Yes Reyne Dumas, MD  POTASSIUM PO Take 1 tablet by mouth at bedtime as needed (leg cramps).   Yes [provider]  Blood Glucose Monitoring Suppl (ACCU-CHEK AVIVA PLUS) w/Device KIT 1 each by Does not apply route daily. 10/23/17   Dettinger, Fransisca Kaufmann, MD  Glucose Blood (BLOOD GLUCOSE TEST STRIPS) STRP 1 strip by In Vitro route 4 (four) times daily. 10/23/17   Dettinger, Fransisca Kaufmann, MD     Family History  Problem Relation Age of Onset  . Cancer Unknown        breast/grandmother, prostate/grandfather    Social History   Socioeconomic History  . Marital status: Married    Spouse name: Not on file  . Number of children: Not on file  . Years of education: Not on file  . Highest education level: Not on file  Occupational History  . Not on file  Social Needs  . Financial resource strain: Not on file  . Food insecurity:    Worry: Not on file    Inability: Not on file  . Transportation needs:    Medical: Not on file    Non-medical: Not on file  Tobacco Use  . Smoking status: Former Smoker    Packs/day: 0.50    Years: 26.00    Pack years: 13.00    Types: Cigarettes    Last attempt to quit: 08/26/2007    Years since quitting: 10.2  . Smokeless tobacco: Never Used  Substance and Sexual Activity  . Alcohol use: Yes    Alcohol/week: 3.6 oz    Types: 6 Glasses of wine per week    Comment: 6 oz x 6 glass of wine daily   . Drug use: No  . Sexual activity: Not on file  Lifestyle  . Physical activity:    Days per week: Not on file    Minutes per session: Not on file  . Stress: Not on file  Relationships  . Social connections:    Talks on phone: Not on file    Gets together: Not on file    Attends religious service: Not on file    Active member of club or organization: Not on file    Attends meetings of clubs or organizations: Not on file    Relationship status: Not on file  Other Topics Concern  . Not on file  Social History Narrative  . Not on file    Review  of Systems: A 12 point ROS discussed and pertinent positives are indicated in the HPI above.  All other systems are negative.  Review of Systems  Constitutional: Positive for activity change, appetite change and fatigue. Negative for fever.  Respiratory: Positive for shortness of breath. Negative for cough.   Cardiovascular: Negative for chest pain.  Gastrointestinal: Negative for abdominal pain.  Musculoskeletal: Positive for gait problem.  Neurological: Positive for weakness.  Psychiatric/Behavioral: Positive for confusion. Negative for behavioral problems.    Vital Signs: BP (!) 114/47   Pulse 74  Temp (!) 97.5 F (36.4 C) (Oral)   Resp 17   Ht '5\' 6"'$  (1.676 m)   Wt 171 lb 4.8 oz (77.7 kg)   SpO2 98%   BMI 27.65 kg/m   Physical Exam  Cardiovascular: Normal rate and regular rhythm.  Pulmonary/Chest: He has wheezes.  Abdominal: Soft. Bowel sounds are normal.  Musculoskeletal: Normal range of motion.  Neurological: He is alert.  Skin: Skin is warm and dry.  Psychiatric: He has a normal mood and affect. His behavior is normal.  Consented wife via phone  Nursing note and vitals reviewed.   Imaging: US Renal  Result Date: 11/11/2017 CLINICAL DATA:  Acute renal failure. EXAM: RENAL / URINARY TRACT ULTRASOUND COMPLETE COMPARISON:  CT abdomen pelvis dated October 16, 2017. FINDINGS: Right Kidney: Length: 11.7 cm. Echogenicity within normal limits. No mass or hydronephrosis visualized. Left Kidney: Length: 11.3 cm. Echogenicity within normal limits. No mass or hydronephrosis visualized. Bladder: Decompressed by Foley catheter. Small ascites. IMPRESSION: 1. Normal renal ultrasound. 2. Small ascites. Electronically Signed   By: Titus Dubin M.D.   On: 11/11/2017 10:23   US Abdomen Limited  Result Date: 11/17/2017 CLINICAL DATA:  Hepatic cirrhosis EXAM: ULTRASOUND ABDOMEN LIMITED RIGHT UPPER QUADRANT COMPARISON:  Abdominal and pelvic CT scan of November 13, 2017 FINDINGS:  Gallbladder: The gallbladder is surgically absent. Common bile duct: Diameter: 5.3 mm Liver: The hepatic echotexture is mildly increased and is heterogeneous. The surface contour of the liver is fairly smooth. There is a small amount of ascites. No focal mass or ductal dilation is observed. Portal vein is patent on color Doppler imaging with normal direction of blood flow towards the liver. IMPRESSION: Ascites. Increased hepatic echotexture likely reflects cirrhosis. No suspicious hepatic masses. Previous cholecystectomy.  Normal caliber common bile duct. Electronically Signed   By: David  Martinique M.D.   On: 11/17/2017 11:13   Dg Chest Port 1 View  Result Date: 11/22/2017 CLINICAL DATA:  Respiratory failure EXAM: PORTABLE CHEST 1 VIEW COMPARISON:  11/16/2017 and prior exams FINDINGS: Cardiomediastinal silhouette is unchanged. An endotracheal tube and NG tube have been removed. A RIGHT IJ central venous catheter with tip overlying the mid SVC again noted. Mild bilateral interstitial opacities and LEFT basilar atelectasis again noted. There is no evidence of pneumothorax. IMPRESSION: Endotracheal tube and NG tube removal. Otherwise unchanged appearance of the chest. Electronically Signed   By: Margarette Canada M.D.   On: 11/22/2017 07:17   Portable Chest X-ray  Result Date: 11/16/2017 CLINICAL DATA:  ETT placement EXAM: PORTABLE CHEST 1 VIEW COMPARISON:  11/16/2017, 11/11/2017, 11/08/2017, CT chest 10/16/2017 FINDINGS: Endotracheal tube tip is at or just above the carina. Right-sided central venous catheter tip overlies the SVC. Esophageal tube tip is below the diaphragm but not included. Stable enlarged cardiomediastinal silhouette. Similar foci of ground-glass opacity in the upper lobes with streaky atelectasis at the left base. Old rib fractures. IMPRESSION: 1. Endotracheal tube tip at or just above the carina 2. Stable enlarged cardiomediastinal silhouette with persistent left basilar atelectasis. Mild diffuse  interstitial opacity suggesting minimal edema; focal ground-glass opacities in the upper lobes may reflect superimposed foci of infection or inflammation. Electronically Signed   By: Donavan Foil M.D.   On: 11/16/2017 18:17   Dg Chest Port 1 View  Result Date: 11/16/2017 CLINICAL DATA:  Central line placement EXAM: PORTABLE CHEST 1 VIEW COMPARISON:  Portable exam 1630 hours compared to 11/14/2017 FINDINGS: RIGHT jugular central venous catheter with tip projecting over SVC. Enlargement of  cardiac silhouette with pulmonary vascular congestion. Mild chronic interstitial infiltrates likely pulmonary edema, see question slightly increased in LEFT upper lobe. No pleural effusion or pneumothorax. Bones demineralized with malleable plates again identified at the sternum. Old LEFT rib fractures. IMPRESSION: No pneumothorax following RIGHT jugular line placement. Probable mild pulmonary edema, question minimally increased Electronically Signed   By: Lavonia Dana M.D.   On: 11/16/2017 17:09   Dg Chest Port 1 View  Result Date: 11/14/2017 CLINICAL DATA:  Hypoxia. EXAM: PORTABLE CHEST 1 VIEW COMPARISON:  11/11/2017.  Chest CT dated 10/16/2017. FINDINGS: Stable enlarged cardiac silhouette and prominence of the interstitial markings. Increased prominence of the pulmonary vasculature. Mild decrease in patchy opacity at the left lung base. Stable sternal plates and screws. Previously demonstrated old bilateral rib fractures. IMPRESSION: 1. Mildly improved left lower lobe atelectasis or pneumonia. 2. Interval mild pulmonary vascular congestion. 3. Stable cardiomegaly and chronic interstitial lung disease. Electronically Signed   By: Claudie Revering M.D.   On: 11/14/2017 09:44   Dg Chest Port 1 View  Result Date: 11/11/2017 CLINICAL DATA:  Pulmonary edema. EXAM: PORTABLE CHEST 1 VIEW COMPARISON:  11/08/2017 FINDINGS: The endotracheal tube may have been removed retracted slightly in the interim, now terminating approximately 2  cm above the carina. Enteric tube terminates in the distal stomach. The cardiac silhouette is mildly enlarged. Lung volumes are low with similar appearance of mild pulmonary vascular congestion. There is increased retrocardiac opacity in the left lung base likely reflecting atelectasis. No sizable pleural effusion or pneumothorax is identified. IMPRESSION: 1. Endotracheal tube 2 cm above the carina. 2. Low lung volumes with unchanged mild pulmonary vascular congestion and increased left basilar atelectasis. Electronically Signed   By: Logan Bores M.D.   On: 11/11/2017 09:08   Dg Chest Port 1 View  Result Date: 11/08/2017 CLINICAL DATA:  Endotracheal and orogastric tube placement EXAM: PORTABLE CHEST 1 VIEW COMPARISON:  Chest radiograph 11/07/2017 FINDINGS: Endotracheal tube tip is just above the carina. This should be retracted by 3-4 cm. The orogastric tube tip and side-port in the stomach. Unchanged cardiomediastinal contours. Pulmonary vascular congestion. IMPRESSION: Recommend retraction of endotracheal tube by 3-4 cm to place the tip at the level of the clavicular heads. Intragastric location of orogastric tube tip and side port. Electronically Signed   By: Ulyses Jarred M.D.   On: 11/08/2017 01:52   Dg Chest Portable 1 View  Result Date: 11/07/2017 CLINICAL DATA:  57 year old male with history of lower extremity swelling bilaterally for the past 3 days. EXAM: PORTABLE CHEST 1 VIEW COMPARISON:  Chest x-ray 10/16/2017. FINDINGS: Lung volumes are normal. No consolidative airspace disease. No pleural effusions. No pneumothorax. No pulmonary nodule or mass noted. Pulmonary vasculature and the cardiomediastinal silhouette are within normal limits. Atherosclerosis in the thoracic aorta. Orthopedic fixation hardware in the sternum incidentally noted. Multiple old healed fractures of the ribs bilaterally. IMPRESSION: 1.  No radiographic evidence of acute cardiopulmonary disease. 2. Aortic atherosclerosis. 3.  Additional incidental findings, as above. Electronically Signed   By: Vinnie Langton M.D.   On: 11/07/2017 15:18   Dg Abd Portable 1v  Result Date: 11/16/2017 CLINICAL DATA:  OG tube placement EXAM: PORTABLE ABDOMEN - 1 VIEW COMPARISON:  CT 10/16/2017 FINDINGS: Esophageal tube tip is coiled in the region of the distal stomach. Surgical clips in the right upper quadrant. Gas-filled loops of bowel in the central abdomen. Treated compression of the spine. IMPRESSION: Esophageal tube tip is coiled within the distal stomach. Electronically Signed  By: Donavan Foil M.D.   On: 11/16/2017 18:18   Dg Abd Portable 1v  Result Date: 11/16/2017 CLINICAL DATA:  Abdominal distension EXAM: PORTABLE ABDOMEN - 1 VIEW COMPARISON:  Portable exam 1628 hours compared to CT abdomen and pelvis 10/16/2017 FINDINGS: Gaseous distention of stomach. Air-filled normal sized small bowel loops in mid abdomen. Paucity of colonic gas. No definite bowel wall thickening or obstruction. Bones demineralized with old appearing compression deformities at T12 and L1 and prior spinal augmentation procedure at L2. IMPRESSION: Gaseous distention of stomach. Electronically Signed   By: Lavonia Dana M.D.   On: 11/16/2017 17:08   Dg Swallowing Func-speech Pathology  Result Date: 11/21/2017 Objective Swallowing Evaluation: Type of Study: MBS-Modified Barium Swallow Study  Patient Details Name: ANWAR SAKATA MRN: [id number] Date of Birth: 06/30/1961 Today's Date: 11/21/2017 Time: SLP Start Time (ACUTE ONLY): 3419 -SLP Stop Time (ACUTE ONLY): 1233 SLP Time Calculation (min) (ACUTE ONLY): 11 min Past Medical History: Past Medical History: Diagnosis Date . Chronic lower back pain  . Compression fracture   lumbar 3 . Diabetes mellitus  . Fracture acetabulum-closed ([place]) 04/09/2013 . GERD (gastroesophageal reflux disease)  . H/O Legionnaire's disease  . Hepatomegaly  . Migraine  . Osteoporosis  . Pancreatitis  . PTSD (post-traumatic stress disorder)  .  Vitamin D deficiency  Past Surgical History: Past Surgical History: Procedure Laterality Date . BACK SURGERY   . CHOLECYSTECTOMY   . HERNIA REPAIR   . KYPHOPLASTY N/A 07/16/2015  Procedure: Lumbar three kyphoplasty;  Surgeon: Ashok Pall, MD;  Location: [place] [facility];  Service: Neurosurgery;  Laterality: N/A;  Lumbar three kyphoplasty . LAMINECTOMY   . MASS EXCISION Left 06/12/2017  Procedure: EXCISION LEFT AXILLARY SEBACEOUS CYST;  Surgeon: Coralie Keens, MD;  Location: [place];  Service: General;  Laterality: Left; . STERNUM FRACTURE SURGERY   HPI: Pt is a 57 yo male who presented with decompensated cirrhosis with hepatic encephalopathy. PMHx includes DM, PTSD, compression fx's and pain/ debilitation, GERD, EtOH abuse with cirrhosis. Pt recently hospitalized in Feb 2019 for flu. Previously seen by SLP in 2016 following intubation, with FEES revealing reversible dysphagia and pt was eventually d/c from SLP on regular solid and thin liquid diet. Intubated 3/17-3/20 and re-intubated 3/25-3/28. Pt currently on CRRT and nasal canula  Subjective: alert, eager for POs Assessment / Plan / Recommendation CHL IP CLINICAL IMPRESSIONS 11/21/2017 Clinical Impression Pt has a mild oropharyngeal dysphagia that is likely multifactorial given recent intubations and generalized deconditioning, also impacted by his altered mentation although this does seem to be improving. Pt has mildly decreased bolus cohesion, allowing thin liquids to spill prematurely as far as the pyriform sinuses before triggering a swallow. He maintains good airway protection even across challenging with intermittent penetration that clears the laryngeal vestibule upon completion of the swallow. No aspiration is observed. Pt also has reduced base of tongue retraction that leaves mild vallecular residue after the swallow. Given presence of residue and considering mentation that has been fluctuating, recommend starting with Dys 3 diet and  thin liquids. SLP will follow for tolerance and potential to advance. SLP Visit Diagnosis Dysphagia, oropharyngeal phase (R13.12) Attention and concentration deficit following -- Frontal lobe and executive function deficit following -- Impact on safety and function Mild aspiration risk   CHL IP TREATMENT RECOMMENDATION 11/21/2017 Treatment Recommendations Therapy as outlined in treatment plan below   Prognosis 11/21/2017 Prognosis for Safe Diet Advancement Good Barriers to Reach Goals -- Barriers/Prognosis Comment -- CHL IP  DIET RECOMMENDATION 11/21/2017 SLP Diet Recommendations Dysphagia 3 (Mech soft) solids;Thin liquid Liquid Administration via Cup;Straw Medication Administration Whole meds with liquid Compensations Minimize environmental distractions;Slow rate;Small sips/bites Postural Changes Seated upright at 90 degrees   CHL IP OTHER RECOMMENDATIONS 11/21/2017 Recommended Consults -- Oral Care Recommendations Oral care BID Other Recommendations --   CHL IP FOLLOW UP RECOMMENDATIONS 11/21/2017 Follow up Recommendations None   CHL IP FREQUENCY AND DURATION 11/21/2017 Speech Therapy Frequency (ACUTE ONLY) min 2x/week Treatment Duration 2 weeks      CHL IP ORAL PHASE 11/21/2017 Oral Phase Impaired Oral - Pudding Teaspoon -- Oral - Pudding Cup -- Oral - Honey Teaspoon -- Oral - Honey Cup -- Oral - Nectar Teaspoon -- Oral - Nectar Cup -- Oral - Nectar Straw -- Oral - Thin Teaspoon -- Oral - Thin Cup Decreased bolus cohesion;Premature spillage Oral - Thin Straw Decreased bolus cohesion;Premature spillage Oral - Puree WFL Oral - Mech Soft WFL Oral - Regular -- Oral - Multi-Consistency -- Oral - Pill WFL Oral Phase - Comment --  CHL IP PHARYNGEAL PHASE 11/21/2017 Pharyngeal Phase Impaired Pharyngeal- Pudding Teaspoon -- Pharyngeal -- Pharyngeal- Pudding Cup -- Pharyngeal -- Pharyngeal- Honey Teaspoon -- Pharyngeal -- Pharyngeal- Honey Cup -- Pharyngeal -- Pharyngeal- Nectar Teaspoon -- Pharyngeal -- Pharyngeal- Nectar Cup --  Pharyngeal -- Pharyngeal- Nectar Straw -- Pharyngeal -- Pharyngeal- Thin Teaspoon -- Pharyngeal -- Pharyngeal- Thin Cup Reduced tongue base retraction;Pharyngeal residue - valleculae;Penetration/Aspiration during swallow Pharyngeal Material enters airway, remains ABOVE vocal cords then ejected out Pharyngeal- Thin Straw Reduced tongue base retraction;Pharyngeal residue - valleculae;Penetration/Aspiration during swallow Pharyngeal Material enters airway, remains ABOVE vocal cords then ejected out Pharyngeal- Puree Reduced tongue base retraction;Pharyngeal residue - valleculae Pharyngeal -- Pharyngeal- Mechanical Soft Reduced tongue base retraction;Pharyngeal residue - valleculae Pharyngeal -- Pharyngeal- Regular -- Pharyngeal -- Pharyngeal- Multi-consistency -- Pharyngeal -- Pharyngeal- Pill Reduced tongue base retraction Pharyngeal -- Pharyngeal Comment --  CHL IP CERVICAL ESOPHAGEAL PHASE 11/21/2017 Cervical Esophageal Phase WFL Pudding Teaspoon -- Pudding Cup -- Honey Teaspoon -- Honey Cup -- Nectar Teaspoon -- Nectar Cup -- Nectar Straw -- Thin Teaspoon -- Thin Cup -- Thin Straw -- Puree -- Mechanical Soft -- Regular -- Multi-consistency -- Pill -- Cervical Esophageal Comment -- No flowsheet data found. Germain Osgood 11/21/2017, 2:20 PM  Germain Osgood, M.A. CCC-SLP [phone]              Labs:  CBC: Recent Labs    11/22/17 0430 11/23/17 1533 11/24/17 0429 11/26/17 0648  WBC 16.9* 12.4* 8.0 6.9  HGB 8.6* 8.4* 9.0* 9.1*  HCT 28.9* 28.8* 29.6* 29.6*  PLT 36* 39* 38* 52*    COAGS: Recent Labs    10/16/17 1908 11/07/17 1754  11/20/17 0437 11/21/17 0429 11/22/17 0945 11/26/17 0619  INR 1.58 1.90   < > 1.45 1.40 1.45 1.64  APTT 46* 45*  --   --   --   --   --    < > = values in this interval not displayed.    BMP: Recent Labs    11/23/17 1534 11/24/17 0429 11/25/17 0857 11/26/17 0730  NA 141 141 140 140  K 4.2 4.0 4.2 3.9  CL 104 105 104 103  CO2 19* 21* 23 21*    GLUCOSE 296* 133* 203* 142*  BUN 22* 24* 9 15  CALCIUM 9.0 8.7* 8.6* 8.6*  CREATININE 5.68* 6.08* 4.19* 5.30*  GFRNONAA 10* 9* 15* 11*  GFRAA 12* 11* 17* 13*    LIVER FUNCTION  TESTS: Recent Labs    11/19/17 0410  11/22/17 0430 11/23/17 1534 11/24/17 0429 11/26/17 0619 11/26/17 0730  BILITOT 11.9*  --  13.2* 11.6*  --  9.1*  --   AST 77*  --  86* 70*  --  73*  --   ALT 33  --  38 37  --  30  --   ALKPHOS 158*  --  144* 141*  --  122  --   PROT 6.7  --  6.4* 6.8  --  6.6  --   ALBUMIN 3.2*   < > 2.9* 3.4* 3.1* 3.5 3.5   < > = values in this interval not displayed.    TUMOR MARKERS: No results for input(s): AFPTM, CEA, CA199, CHROMGRNA in the last 8760 hours.  Assessment and Plan:  AKI Temp dialysis now- placed 3/25 Dr Larose Hires asking for tunneled cath-- pt seems to be doing better Scheduled for tunneled dialysis catheter placement in am Risks and benefits discussed with the patient including, but not limited to bleeding, infection, vascular injury, pneumothorax which may require chest tube placement, air embolism or even death  All of the patient's questions were answered, patient is agreeable to proceed. Consent signed and in chart.   Thank you for this interesting consult.  I greatly enjoyed meeting ALICE VITELLI and look forward to participating in their care.  A copy of this report was sent to the requesting provider on this date.  Electronically Signed: Lavonia Drafts, PA-C 11/26/2017, 11:07 AM   I spent a total of 20 Minutes    in face to face in clinical consultation, greater than 50% of which was counseling/coordinating care for tunneled dialysis catheter placement

## 2017-11-27 ENCOUNTER — Inpatient Hospital Stay (HOSPITAL_COMMUNITY): Payer: Medicare HMO

## 2017-11-27 ENCOUNTER — Encounter (HOSPITAL_COMMUNITY): Payer: Self-pay | Admitting: Interventional Radiology

## 2017-11-27 DIAGNOSIS — N186 End stage renal disease: Secondary | ICD-10-CM

## 2017-11-27 DIAGNOSIS — K7031 Alcoholic cirrhosis of liver with ascites: Secondary | ICD-10-CM

## 2017-11-27 HISTORY — PX: IR US GUIDE VASC ACCESS RIGHT: IMG2390

## 2017-11-27 HISTORY — PX: IR FLUORO GUIDE CV LINE RIGHT: IMG2283

## 2017-11-27 LAB — CBC
HCT: 31.8 % — ABNORMAL LOW (ref 39.0–52.0)
HEMATOCRIT: 30.4 % — AB (ref 39.0–52.0)
Hemoglobin: 9.4 g/dL — ABNORMAL LOW (ref 13.0–17.0)
Hemoglobin: 9.5 g/dL — ABNORMAL LOW (ref 13.0–17.0)
MCH: 34.9 pg — ABNORMAL HIGH (ref 26.0–34.0)
MCH: 36.5 pg — ABNORMAL HIGH (ref 26.0–34.0)
MCHC: 29.6 g/dL — ABNORMAL LOW (ref 30.0–36.0)
MCHC: 31.3 g/dL (ref 30.0–36.0)
MCV: 118.2 fL — AB (ref 78.0–100.0)
MCV: 116.9 fL — AB (ref 78.0–100.0)
PLATELETS: 63 10*3/uL — AB (ref 150–400)
Platelets: 66 10*3/uL — ABNORMAL LOW (ref 150–400)
RBC: 2.69 MIL/uL — ABNORMAL LOW (ref 4.22–5.81)
RBC: 2.6 MIL/uL — ABNORMAL LOW (ref 4.22–5.81)
RDW: 15.7 % — ABNORMAL HIGH (ref 11.5–15.5)
RDW: 15.7 % — ABNORMAL HIGH (ref 11.5–15.5)
WBC: 8.2 10*3/uL (ref 4.0–10.5)
WBC: 7.4 10*3/uL (ref 4.0–10.5)

## 2017-11-27 LAB — RENAL FUNCTION PANEL
Albumin: 3.9 g/dL (ref 3.5–5.0)
Anion gap: 15 (ref 5–15)
BUN: 14 mg/dL (ref 6–20)
CHLORIDE: 107 mmol/L (ref 101–111)
CO2: 19 mmol/L — ABNORMAL LOW (ref 22–32)
CREATININE: 4.69 mg/dL — AB (ref 0.61–1.24)
Calcium: 8.9 mg/dL (ref 8.9–10.3)
GFR calc non Af Amer: 13 mL/min — ABNORMAL LOW (ref >60)
GFR, EST AFRICAN AMERICAN: 15 mL/min — AB (ref >60)
GLUCOSE: 178 mg/dL — AB (ref 65–99)
POTASSIUM: 4.4 mmol/L (ref 3.5–5.1)
Phosphorus: 3.6 mg/dL (ref 2.5–4.6)
SODIUM: 141 mmol/L (ref 135–145)

## 2017-11-27 LAB — BASIC METABOLIC PANEL
ANION GAP: 10 (ref 5–15)
BUN: 11 mg/dL (ref 6–20)
CO2: 23 mmol/L (ref 22–32)
CREATININE: 4.26 mg/dL — AB (ref 0.61–1.24)
Calcium: 9.1 mg/dL (ref 8.9–10.3)
Chloride: 106 mmol/L (ref 101–111)
GFR calc Af Amer: 17 mL/min — ABNORMAL LOW (ref >60)
GFR, EST NON AFRICAN AMERICAN: 14 mL/min — AB (ref >60)
GLUCOSE: 174 mg/dL — AB (ref 65–99)
Potassium: 3.6 mmol/L (ref 3.5–5.1)
Sodium: 139 mmol/L (ref 135–145)

## 2017-11-27 LAB — GLUCOSE, CAPILLARY
Glucose-Capillary: 149 mg/dL — ABNORMAL HIGH (ref 65–99)
Glucose-Capillary: 156 mg/dL — ABNORMAL HIGH (ref 65–99)
Glucose-Capillary: 180 mg/dL — ABNORMAL HIGH (ref 65–99)
Glucose-Capillary: 186 mg/dL — ABNORMAL HIGH (ref 65–99)
Glucose-Capillary: 215 mg/dL — ABNORMAL HIGH (ref 65–99)

## 2017-11-27 LAB — PROTIME-INR
INR: 1.64
Prothrombin Time: 19.3 seconds — ABNORMAL HIGH (ref 11.4–15.2)

## 2017-11-27 MED ORDER — CEFAZOLIN SODIUM-DEXTROSE 2-4 GM/100ML-% IV SOLN
2.0000 g | INTRAVENOUS | Status: AC
  Administered 2017-11-27: 2 g via INTRAVENOUS
  Filled 2017-11-27: qty 100

## 2017-11-27 MED ORDER — MIDAZOLAM HCL 2 MG/2ML IJ SOLN
INTRAMUSCULAR | Status: AC
  Filled 2017-11-27: qty 4

## 2017-11-27 MED ORDER — FENTANYL CITRATE (PF) 100 MCG/2ML IJ SOLN
INTRAMUSCULAR | Status: AC | PRN
  Administered 2017-11-27 (×2): 50 ug via INTRAVENOUS

## 2017-11-27 MED ORDER — MIDAZOLAM HCL 2 MG/2ML IJ SOLN
INTRAMUSCULAR | Status: AC | PRN
  Administered 2017-11-27 (×2): 1 mg via INTRAVENOUS

## 2017-11-27 MED ORDER — LIDOCAINE HCL 1 % IJ SOLN
INTRAMUSCULAR | Status: AC
  Filled 2017-11-27: qty 20

## 2017-11-27 MED ORDER — LIDOCAINE HCL (PF) 1 % IJ SOLN
INTRAMUSCULAR | Status: AC | PRN
  Administered 2017-11-27: 15 mL

## 2017-11-27 MED ORDER — CHLORHEXIDINE GLUCONATE 4 % EX LIQD
CUTANEOUS | Status: AC
  Filled 2017-11-27: qty 15

## 2017-11-27 MED ORDER — CHLORHEXIDINE GLUCONATE 4 % EX LIQD
CUTANEOUS | Status: AC | PRN
  Administered 2017-11-27: 1 via TOPICAL

## 2017-11-27 MED ORDER — FENTANYL CITRATE (PF) 100 MCG/2ML IJ SOLN
INTRAMUSCULAR | Status: AC
  Filled 2017-11-27: qty 4

## 2017-11-27 MED ORDER — HEPARIN SODIUM (PORCINE) 1000 UNIT/ML IJ SOLN
INTRAMUSCULAR | Status: AC
  Administered 2017-11-27: 3.2 [IU]
  Filled 2017-11-27: qty 1

## 2017-11-27 NOTE — Progress Notes (Signed)
PROGRESS NOTE  [name] Gheearon P Noy ZOX:[id number] DOB: 07/09/1961 DOA: 11/07/2017 PCP: Dettinger, Elige RadonJoshua A, MD  HPI/Recap of past 24 hours:  [name] is a 57 y.o. year old male with medical history significant for alcoholic cirrhosis, T2DM, PTSD, and chronic back pain who presented on 11/07/2017 with encephalopathy related to decompensated cirrhosis requiring admission to critical care team; [facility] Prolonged hospital course included intubation on 3/16 (extubated 3/20) due to hypoxia and somnolence, recurrent hypoglycemia requiring IV dextrose, severe cellulitis of lower extremities treated with vancomycin during time in critical care. No urine output with AKI prompted discontinuation of Vancomyin( 3/20). Nephrology started trial of middorine, octreotide, and albumin on 3/23.  Palliative care has been following, patient is currently limited code ( no CPR or intubation, pressors only)   Subjective Still complaining of back pain from his HD session. Had a restless night. He is anxious about HD session tomorrow. He wants to do it but doesn't like how it makes him feel and he would like to talk to our palliative team.    Assessment/Plan: Active Problems:   Hypoxia   Acute respiratory failure with hypoxia (HCC)   Decompensation of cirrhosis of liver (HCC)   Acute renal failure (ARF) (HCC)   Goals of care, counseling/discussion   Palliative care by specialist   ESRD (end stage renal disease) (HCC)   Thrombocytopenia (HCC)   Alcohol abuse   #Anuric Acute renal failure- most likely HRS in setting of decompensated cirrhosis. Last HD session on 4/4 w/ 3.5 L removed, tentative plan for tunneled catheter placement; however, patient anxious and hesitant about further HD sessions. Would like to talk with palliative team to discuss more.  On IV albumin, midodrine, octreotide  #Decompensated Alcoholic cirrhosis with hepatic encephalopathy, stable. Normal mentation  and continues to remain w/o asterixis. Continue lactulose ( 20-g TID) and rifaximin regimen.    #Hyperbilirubinemia and Elevated AST, improving. Likely related to cirrhosis given hepatitis panel on admission negative and both are downtrending steadily.   #Thrombocytopenia- improving . Asymptomatic, continue to monitor on CBC  #Type 2 DM- A1C 7.6 ( 2014), stable and controlled sliding scale, monitor on CBG   #Alcohol Abuse. Discussed importance of cessation with family and patient. Folic and thiamine supplementation. Continue Seroquel  #Acute hypoxic respiratory failure, resolved. S/p 2 intubations during hospitalization likely related to inability to protect airway in setting of decompensated cirrhosis/hepatic encephalopathy and metabolic derangements from worsening renal failure earlier in admission. Continue to encourage incentive spirometry  #Acute on Chronic back pain- flare worsened by recent HD session, continue oxycodone PRN q4h severe pain, and robaxin  Code Status: Partial Code ( NO CPR or intubation, pressors allowed)   Family Communication: Parents updated at bedside  Disposition Plan: planned for tunneled dialysis catheter today, patient express hesitation/anxiety about further HD sessions and seems like he is considering declining for better quality of life. He wants to discuss with our palliative medicine team.   Consultants:  Pulmonology  Nephrology  Gastroenterology  Palliative Care  Interventional Radiology  Antimicrobials: Ceftriaxone 3/16 >>3.22 Vancomycin 3/17 >>3/18 Zosyn 3/24 >>3/30   Telemetry: Yes  DVT prophylaxis:  Heparin   Objective: Vitals:   11/26/17 1115 11/26/17 1130 11/26/17 2138 11/27/17 0536  BP: 105/66 108/75 107/80 114/67  Pulse: 78 77 (!) 52 (!) 53  Resp:  18 18 15   Temp:  98.2 F (36.8 C) 98.5 F (36.9 C) 98.4 F (36.9 C)  TempSrc:  Oral Oral Oral  SpO2:  99% 99% 97%  Weight:  74.4 kg (164 lb 0.4 oz)    Height:         Intake/Output Summary (Last 24 hours) at 11/27/2017 0842 Last data filed at 11/26/2017 1130 Gross per 24 hour  Intake -  Output 3500 ml  Net -3500 ml   Filed Weights   11/26/17 0500 11/26/17 0800 11/26/17 1130  Weight: 77.7 kg (171 lb 4.8 oz) 77.7 kg (171 lb 4.8 oz) 74.4 kg (164 lb 0.4 oz)    Exam:  Constitutional:chronically ill appearing male Eyes: EOMI, scleral icterus, normal conjunctivae Cardiovascular: RRR no MRGs, with 1+ pitting edema Respiratory: Normal respiratory effort on room air, clear breath sounds  Abdomen:Distended, soft, non-tender, with no appreciable HSM Skin: skin breakdown with dressing on lower leg. Without skin tenting , Jaundiced Neurologic: Grossly no focal neuro deficit. Psychiatric:Appropriate affect, and mood. Mental status AAOx3  Data Reviewed: CBC: Recent Labs  Lab 11/21/17 0429 11/22/17 0430 11/23/17 1533 11/24/17 0429 11/26/17 0648  WBC 22.2* 16.9* 12.4* 8.0 6.9  NEUTROABS  --  13.3* 9.5* 6.8  --   HGB 8.7* 8.6* 8.4* 9.0* 9.1*  HCT 29.8* 28.9* 28.8* 29.6* 29.6*  MCV 122.6* 122.5* 122.0* 121.8* 119.4*  PLT 35* 36* 39* 38* 52*   Basic Metabolic Panel: Recent Labs  Lab 11/21/17 0429 11/22/17 0430 11/23/17 1533 11/23/17 1534 11/24/17 0429 11/25/17 0857 11/26/17 0730  NA  --  139  --  141 141 140 140  K  --  4.2  --  4.2 4.0 4.2 3.9  CL  --  104  --  104 105 104 103  CO2  --  24  --  19* 21* 23 21*  GLUCOSE  --  138*  --  296* 133* 203* 142*  BUN  --  13  --  22* 24* 9 15  CREATININE  --  3.54*  --  5.68* 6.08* 4.19* 5.30*  CALCIUM  --  8.6*  --  9.0 8.7* 8.6* 8.6*  MG 2.3 2.6* 2.8*  --  2.7* 2.2  --   PHOS  --  3.6 5.2*  --  4.9*  5.0* 4.4 3.8   GFR: Estimated Creatinine Clearance: 14.2 mL/min (A) (by C-G formula based on SCr of 5.3 mg/dL (H)). Liver Function Tests: Recent Labs  Lab 11/22/17 0430 11/23/17 1534 11/24/17 0429 11/26/17 0619 11/26/17 0730  AST 86* 70*  --  73*  --   ALT 38 37  --  30  --   ALKPHOS  144* 141*  --  122  --   BILITOT 13.2* 11.6*  --  9.1*  --   PROT 6.4* 6.8  --  6.6  --   ALBUMIN 2.9* 3.4* 3.1* 3.5 3.5   No results for input(s): LIPASE, AMYLASE in the last 168 hours. Recent Labs  Lab 11/21/17 0427 11/22/17 0430  AMMONIA 45* 51*   Coagulation Profile: Recent Labs  Lab 11/21/17 0429 11/22/17 0945 11/26/17 0619  INR 1.40 1.45 1.64   Cardiac Enzymes: No results for input(s): CKTOTAL, CKMB, CKMBINDEX, TROPONINI in the last 168 hours. BNP (last 3 results) No results for input(s): PROBNP in the last 8760 hours. HbA1C: No results for input(s): HGBA1C in the last 72 hours. CBG: Recent Labs  Lab 11/25/17 2041 11/26/17 1634 11/26/17 2139 11/27/17 0026 11/27/17 0748  GLUCAP 193* 217* 178* 186* 149*   Lipid Profile: No results for input(s): CHOL, HDL, LDLCALC, TRIG, CHOLHDL, LDLDIRECT in the last 72 hours. Thyroid  Function Tests: No results for input(s): TSH, T4TOTAL, FREET4, T3FREE, THYROIDAB in the last 72 hours. Anemia Panel: No results for input(s): VITAMINB12, FOLATE, FERRITIN, TIBC, IRON, RETICCTPCT in the last 72 hours. Urine analysis:    Component Value Date/Time   COLORURINE YELLOW 11/11/2017 1205   APPEARANCEUR TURBID (A) 11/11/2017 1205   LABSPEC 1.025 11/11/2017 1205   PHURINE 6.5 11/11/2017 1205   GLUCOSEU NEGATIVE 11/11/2017 1205   HGBUR LARGE (A) 11/11/2017 1205   BILIRUBINUR LARGE (A) 11/11/2017 1205   BILIRUBINUR n 02/28/2011 1247   KETONESUR NEGATIVE 11/11/2017 1205   PROTEINUR >300 (A) 11/11/2017 1205   UROBILINOGEN 1.0 01/17/2015 1305   NITRITE NEGATIVE 11/11/2017 1205   LEUKOCYTESUR MODERATE (A) 11/11/2017 1205   Sepsis Labs: @LABRCNTIP (procalcitonin:4,lacticidven:4)  ) No results found for this or any previous visit (from the past 240 hour(s)).    Studies: No results found.  Scheduled Meds: . chlorhexidine  15 mL Mouth Rinse BID  . Chlorhexidine Gluconate Cloth  6 each Topical Daily  . famotidine  20 mg Oral QHS  .  feeding supplement  1 Container Oral BID BM  . feeding supplement (PRO-STAT SUGAR FREE 64)  30 mL Oral BID  . folic acid  1 mg Oral Daily  . insulin aspart  0-15 Units Subcutaneous TID AC & HS  . lactulose  20 g Per Tube TID   Or  . lactulose  300 mL Rectal TID  . lidocaine  1 patch Transdermal Q24H  . mouth rinse  15 mL Mouth Rinse q12n4p  . methocarbamol  500 mg Oral TID  . midodrine  10 mg Oral TID WC  . octreotide  200 mcg Subcutaneous BID  . QUEtiapine  25 mg Oral QHS  . rifaximin  550 mg Oral BID  . sodium chloride flush  10-40 mL Intracatheter Q12H  . thiamine  100 mg Oral Daily    Continuous Infusions: . sodium chloride Stopped (11/20/17 0900)  . sodium chloride    . sodium chloride    . albumin human    .  ceFAZolin (ANCEF) IV       LOS: 20 days     Laverna Peace, MD [facility] Pager [phone]  If 7PM-7AM, please contact night-coverage [email or web address] Password [facility] 11/27/2017, 8:42 AM

## 2017-11-27 NOTE — Sedation Documentation (Signed)
Patient is resting comfortably. 

## 2017-11-27 NOTE — Procedures (Signed)
Interventional Radiology Procedure Note  Procedure: Placement of a right IJ tunneled HD catheter.  Tips in the RA, ready for use.  Complications: None  Estimated Blood Loss: None  Recommendations: - ROutine line care   Signed,  Sterling BigHeath K. Kameryn Tisdel, MD

## 2017-11-27 NOTE — Progress Notes (Signed)
S: Feels better today but did not feel well with HD  O:BP 114/67 (BP Location: Left Arm)   Pulse (!) 53   Temp 98.4 F (36.9 C) (Oral)   Resp 15   Ht 5\' 6"  (1.676 m)   Wt 74.4 kg (164 lb 0.4 oz)   SpO2 97%   BMI 26.47 kg/m  No intake or output data in the 24 hours ending 11/27/17 1315 Intake/Output: I/O last 3 completed shifts: In: 300 [P.O.:300] Out: 3500 [Other:3500]  Intake/Output this shift:  No intake/output data recorded. Weight change: 0 kg (0 lb) Gen:NAD CVS: no rub Resp: cta Abd: distended/tense Ext: 2+ edema  Recent Labs  Lab 11/20/17 1528 11/22/17 0430 11/23/17 1533 11/23/17 1534 11/24/17 0429 11/25/17 0857 11/26/17 0619 11/26/17 0730 11/27/17 0830  NA 138 139  --  141 141 140  --  140 139  K 4.7 4.2  --  4.2 4.0 4.2  --  3.9 3.6  CL 103 104  --  104 105 104  --  103 106  CO2 24 24  --  19* 21* 23  --  21* 23  GLUCOSE 138* 138*  --  296* 133* 203*  --  142* 174*  BUN 5* 13  --  22* 24* 9  --  15 11  CREATININE 1.46* 3.54*  --  5.68* 6.08* 4.19*  --  5.30* 4.26*  ALBUMIN 2.9* 2.9*  --  3.4* 3.1*  --  3.5 3.5  --   CALCIUM 8.4* 8.6*  --  9.0 8.7* 8.6*  --  8.6* 9.1  PHOS 2.3* 3.6 5.2*  --  4.9*  5.0* 4.4  --  3.8  --   AST  --  86*  --  70*  --   --  73*  --   --   ALT  --  38  --  37  --   --  30  --   --    Liver Function Tests: Recent Labs  Lab 11/22/17 0430 11/23/17 1534 11/24/17 0429 11/26/17 0619 11/26/17 0730  AST 86* 70*  --  73*  --   ALT 38 37  --  30  --   ALKPHOS 144* 141*  --  122  --   BILITOT 13.2* 11.6*  --  9.1*  --   PROT 6.4* 6.8  --  6.6  --   ALBUMIN 2.9* 3.4* 3.1* 3.5 3.5   No results for input(s): LIPASE, AMYLASE in the last 168 hours. Recent Labs  Lab 11/21/17 0427 11/22/17 0430  AMMONIA 45* 51*   CBC: Recent Labs  Lab 11/22/17 0430 11/23/17 1533 11/24/17 0429 11/26/17 0648 11/27/17 0830  WBC 16.9* 12.4* 8.0 6.9 8.2  NEUTROABS 13.3* 9.5* 6.8  --   --   HGB 8.6* 8.4* 9.0* 9.1* 9.4*  HCT 28.9* 28.8*  29.6* 29.6* 31.8*  MCV 122.5* 122.0* 121.8* 119.4* 118.2*  PLT 36* 39* 38* 52* 66*   Cardiac Enzymes: No results for input(s): CKTOTAL, CKMB, CKMBINDEX, TROPONINI in the last 168 hours. CBG: Recent Labs  Lab 11/26/17 1634 11/26/17 2139 11/27/17 0026 11/27/17 0748 11/27/17 1141  GLUCAP 217* 178* 186* 149* 156*    Iron Studies: No results for input(s): IRON, TIBC, TRANSFERRIN, FERRITIN in the last 72 hours. Studies/Results: No results found. . chlorhexidine  15 mL Mouth Rinse BID  . Chlorhexidine Gluconate Cloth  6 each Topical Daily  . famotidine  20 mg Oral QHS  . feeding supplement  1 Container  Oral BID BM  . feeding supplement (PRO-STAT SUGAR FREE 64)  30 mL Oral BID  . folic acid  1 mg Oral Daily  . insulin aspart  0-15 Units Subcutaneous TID AC & HS  . lactulose  20 g Per Tube TID   Or  . lactulose  300 mL Rectal TID  . lidocaine  1 patch Transdermal Q24H  . mouth rinse  15 mL Mouth Rinse q12n4p  . methocarbamol  500 mg Oral TID  . midodrine  10 mg Oral TID WC  . octreotide  200 mcg Subcutaneous BID  . QUEtiapine  25 mg Oral QHS  . rifaximin  550 mg Oral BID  . sodium chloride flush  10-40 mL Intracatheter Q12H  . thiamine  100 mg Oral Daily    BMET    Component Value Date/Time   NA 139 11/27/2017 0830   NA 137 10/22/2017 1542   K 3.6 11/27/2017 0830   CL 106 11/27/2017 0830   CO2 23 11/27/2017 0830   GLUCOSE 174 (H) 11/27/2017 0830   BUN 11 11/27/2017 0830   BUN 5 (L) 10/22/2017 1542   CREATININE 4.26 (H) 11/27/2017 0830   CALCIUM 9.1 11/27/2017 0830   CALCIUM (LL) 04/07/2007 1517    5.5 Result repeated and verified. CRITICAL RESULT CALLED TO, READ BACK BY AND VERIFIED WITH: HUDGSON,M. RN 04/07/07 1031 WAYK CORRECTED ON 08/15 AT 0024: PREVIOUSLY REPORTED AS 5.5 Result repeated and verified.   GFRNONAA 14 (L) 11/27/2017 0830   GFRAA 17 (L) 11/27/2017 0830   CBC    Component Value Date/Time   WBC 8.2 11/27/2017 0830   RBC 2.69 (L) 11/27/2017 0830    HGB 9.4 (L) 11/27/2017 0830   HGB 13.1 10/22/2017 1542   HCT 31.8 (L) 11/27/2017 0830   HCT 40.3 10/22/2017 1542   PLT 66 (L) 11/27/2017 0830   PLT 107 (L) 10/22/2017 1542   MCV 118.2 (H) 11/27/2017 0830   MCV 110 (H) 10/22/2017 1542   MCH 34.9 (H) 11/27/2017 0830   MCHC 29.6 (L) 11/27/2017 0830   RDW 15.7 (H) 11/27/2017 0830   RDW 15.1 10/22/2017 1542   LYMPHSABS 0.8 11/24/2017 0429   LYMPHSABS 1.4 10/22/2017 1542   MONOABS 0.4 11/24/2017 0429   EOSABS 0.0 11/24/2017 0429   EOSABS 0.0 10/22/2017 1542   BASOSABS 0.0 11/24/2017 0429   BASOSABS 0.1 10/22/2017 1542    Assessment/Plan:  1. AKI- in setting of decompensated cirrhosis, hypotension, presumably due to SBP and started on CVVHD 3/26-3/29/19. Also had diarrhea and has been negative with I's/O's by 5 liters since admission. No significant UOP to date. Hopefully this is related to ischemic ATN, although hepatorenal remains on the DDx. He is not a candidate for liver transplant due to ongoing alcohol consumption at the time of admission. Currently on midodrine and octreotide. 1. Remains anuric/oliguric 2. Will plan on HDagain tomorrow with longer treatment time and more UF and cont with TTS. 3. I again spokewith patient and his family that if he is not able to tolerate intermittent HD that there is nothing more we can do for him.If this is hepatorenalsyndrome, will need to get palliative care involved as he will not do well. Not sure he would be able to tolerate intermittent HD with his low BP despite midodrine. 4. Have consulted IR for tunneled HD catheter placement 2. Alcoholic cirrhosis with encephalopathy- also with rising bilirubin. On lactulose 3. Thrombocytopenia- due to #2 4. Alcohol abuse- will need rehab and ongoing treatment  to prevent withdrawal.  5. Disposition- prognosis remains poor andappreciatepalliative care'shelp toset goals/limits of care.  If he continues to do well with HD will start CLIP  process and ask [facility] for permanent access placement.   Irena CordsJoseph A. Keierra Nudo, MD [facility] [phone]

## 2017-11-27 NOTE — Progress Notes (Addendum)
Daily Progress Note   Patient Name: [name]       Date: 11/27/2017 DOB: [birth year]  Age: 57 y.o. MRN#: [id number] Attending Physician: Desiree Hane, MD Primary Care Physician: Dettinger, Fransisca Kaufmann, MD Admit Date: 11/07/2017  Reason for Consultation/Follow-up: Establishing goals of care, Pain control and Psychosocial/spiritual support  Subjective: Pt seen, chart reviewed. Pt asking " what's the plan?" Upon further questioning, he feels as though he is having to choose between pain that he cannot handle and "living on HD' vs. His pain managed, stopping HD and dying. He tells me in the ideal world he would like his pain better managed and cont on HD for now.   [name] is now alert, conversant from when I first met him in ICU. He and his wife have been struggling with how sick he is.   Length of Stay: 20  Current Medications: Scheduled Meds:  . chlorhexidine  15 mL Mouth Rinse BID  . Chlorhexidine Gluconate Cloth  6 each Topical Daily  . famotidine  20 mg Oral QHS  . feeding supplement  1 Container Oral BID BM  . feeding supplement (PRO-STAT SUGAR FREE 64)  30 mL Oral BID  . folic acid  1 mg Oral Daily  . insulin aspart  0-15 Units Subcutaneous TID AC & HS  . lactulose  20 g Per Tube TID   Or  . lactulose  300 mL Rectal TID  . lidocaine  1 patch Transdermal Q24H  . mouth rinse  15 mL Mouth Rinse q12n4p  . methocarbamol  500 mg Oral TID  . midodrine  10 mg Oral TID WC  . octreotide  200 mcg Subcutaneous BID  . QUEtiapine  25 mg Oral QHS  . rifaximin  550 mg Oral BID  . sodium chloride flush  10-40 mL Intracatheter Q12H  . thiamine  100 mg Oral Daily    Continuous Infusions: . sodium chloride Stopped (11/20/17 0900)  . sodium chloride    . sodium chloride    .  albumin human    .  ceFAZolin (ANCEF) IV      PRN Meds: sodium chloride, sodium chloride, sodium chloride, albuterol, alteplase, heparin, heparin, lidocaine (PF), lidocaine-prilocaine, oxyCODONE, pentafluoroprop-tetrafluoroeth  Physical Exam  Constitutional: He is oriented to person, place, and time. He appears well-developed and  well-nourished.  Eyes: Scleral icterus is present.  Neck: Normal range of motion.  Cardiovascular:  bradycardic  Pulmonary/Chest: Effort normal.  Abdominal: He exhibits distension.  Musculoskeletal: Normal range of motion.  Neurological: He is alert and oriented to person, place, and time.  Skin: Skin is warm and dry.  Jaundice, mild  Psychiatric:  anxious  Nursing note and vitals reviewed.           Vital Signs: BP 114/67 (BP Location: Left Arm)   Pulse (!) 53   Temp 98.4 F (36.9 C) (Oral)   Resp 15   Ht _0  (1.676 m)   Wt 74.4 kg (164 lb 0.4 oz)   SpO2 97%   BMI 26.47 kg/m  SpO2: SpO2: 97 % O2 Device: O2 Device: Room Air O2 Flow Rate: O2 Flow Rate (L/min): 2 L/min  Intake/output summary:   Intake/Output Summary (Last 24 hours) at 11/27/2017 1059 Last data filed at 11/26/2017 1130 Gross per 24 hour  Intake -  Output 3500 ml  Net -3500 ml   LBM: Last BM Date: 11/26/17 Baseline Weight: Weight: 88.9 kg (195 lb 15.8 oz) Most recent weight: Weight: 74.4 kg (164 lb 0.4 oz)       Palliative Assessment/Data:    Flowsheet Rows     Most Recent Value  Intake Tab  Referral Department  Hospitalist  Unit at Time of Referral  ICU  Palliative Care Primary Diagnosis  Other (Comment)  Date Notified  11/13/17  Palliative Care Type  New Palliative care  Date of Admission  11/07/17  Date first seen by Palliative Care  11/15/17  # of days Palliative referral response time  2 Day(s)  # of days IP prior to Palliative referral  6  Clinical Assessment  Palliative Performance Scale Score  40%  Pain Max last 24 hours  Not able to report  Pain Min  Last 24 hours  Not able to report  Dyspnea Max Last 24 Hours  Not able to report  Dyspnea Min Last 24 hours  Not able to report  Nausea Max Last 24 Hours  Not able to report  Nausea Min Last 24 Hours  Not able to report  Anxiety Max Last 24 Hours  Not able to report  Anxiety Min Last 24 Hours  Not able to report  Other Max Last 24 Hours  Not able to report  Psychosocial & Spiritual Assessment  Palliative Care Outcomes  Patient/Family meeting held?  Yes  Who was at the meeting?  pt and wife  Palliative Care follow-up planned  Yes, Facility      Patient Active Problem List   Diagnosis Date Noted  . Thrombocytopenia ([place]) 11/25/2017  . Alcohol abuse 11/25/2017  . ESRD (end stage renal disease) ([place])   . Goals of care, counseling/discussion   . Palliative care by specialist   . Acute renal failure (ARF) ([place])   . Decompensation of cirrhosis of liver ([place]) 11/07/2017  . Influenza A 10/16/2017  . Closed fracture of second lumbar vertebra ([place])   . Compression fracture of L3 lumbar vertebra with delayed healing 07/16/2015  . Compression fracture of lumbar vertebra ([place]) 07/16/2015  . Pressure ulcer 01/18/2015  . Acute respiratory failure with hypoxia ([place])   . Hypoxia   . Lumbar foraminal stenosis 04/19/2013  . Acetabulum fracture, right ([place]) 04/09/2013  . Type 2 diabetes mellitus ([place]) 04/09/2013  . CIRRHOSIS, ALCOHOLIC 73/71/0626  . LIVER FUNCTION TESTS, ABNORMAL, HX OF 10/02/2009  . PANCREATITIS, HX OF 09/27/2009  .  HYPOGONADISM 08/29/2009  . VITAMIN D DEFICIENCY 08/29/2009  . OSTEOPOROSIS 02/22/2009  . Hypertension associated with diabetes ([place]) 02/08/2009  . GERD 02/08/2009  . BACK PAIN, LUMBAR, CHRONIC 02/08/2009  . LEG EDEMA, LEFT 02/08/2009  . Hepatomegaly 02/08/2009    Palliative Care Assessment & Plan   Patient Profile: 57 y.o. male  with past medical history of DM, PTSD, chronic pain 2/2 comp FX's, sciatica, ETOH use D/O admitted on 11/07/2017 with abdominal  pain, increased LE edema, hypoglycemia. Pt was intubated for airway protection. He now has worsening renal function, possibly hepatorenal syndrome.He is now extubated. He is now close to being oliguric. His hospital coarse has been complicated not only by worsening renal function but delirium requiring restraints. He is now out of restraints and more lucid.  11/27/17 Pt now out of ICU, alert x3, and undergoing HD. He is now struggling with the medical burden of HD and unmanaged pain  Consult ordered for continued GOC, psychosocial support as well as pain mgt    Recommendations/Plan:  Pain: Pt had chronic pain coming into this hospitalization. He has been understanding that due to the severity of his illness more aggressive pain mgt cannot be achieved without dropping his BP. Chart reviewed. Pt has used 50 mg of oxycodone over the past 24/hr. He is not drug naive even prior to this hospitalization. Calculations to convert to Fentanyl TD with 25% cross tolerance reduction for Fentanyl TD would be Fentanyl TD 25 mcg q72. Fentanyl would have the least impact on his BP and would not be dialyzed out. Cont oxycodone PRN break through pain as well as scheduled robaxin  And other pain management alternative may be to start fentanyl transdermal at only 12 mcg every 72 hours; monitor PRN oxycodone as well as BP and how he tolerates hemodialysis with goal to up titrate patch to fentanyl transdermal 25 mcg every 72 hours  Pt's ability to tolerate HD long term both physically as well as emotionally likely limited but as he has improved from his prior level of illness this admission he is understandably struggling with saying "stop HD". He is very debilitated and would in best case scenario need comprehensive rehab with community palliative care to follow in rehab center. Worst case scenario is ongoing debility and inability to tolerate HD.  If he is unable to do ongoing HD whatever the underlying cause, he would  qualify for residential hospice  Goals of Care and Additional Recommendations:  Limitations on Scope of Treatment: Full Scope Treatment  Code Status:    Code Status Orders  (From admission, onward)        Start     Ordered   11/20/17 1720  Limited resuscitation (code)  Continuous    Comments:  Pressors ok  Question Answer Comment  In the event of cardiac or respiratory ARREST: Initiate Code Blue, Call Rapid Response Yes   In the event of cardiac or respiratory ARREST: Perform CPR No   In the event of cardiac or respiratory ARREST: Perform Intubation/Mechanical Ventilation No   In the event of cardiac or respiratory ARREST: Use NIPPV/BiPAp only if indicated Yes   In the event of cardiac or respiratory ARREST: Administer ACLS medications if indicated No   In the event of cardiac or respiratory ARREST: Perform Defibrillation or Cardioversion if indicated No      11/20/17 1722    Code Status History    Date Active Date Inactive Code Status Order ID Comments User Context   11/19/2017 1018  11/20/2017 1722 Partial Code [id number] Pressors ok Brand Males, MD Inpatient   11/12/2017 2159 11/19/2017 1018 Full Code [id number]  Collene Gobble, MD Inpatient   11/12/2017 2159 11/12/2017 2159 Partial Code [id number]  Collene Gobble, MD Inpatient   11/07/2017 1704 11/12/2017 2159 Full Code [id number]  Minor, Grace Bushy, NP ED   10/16/2017 1647 10/18/2017 1631 Full Code [id number]  Brenton Grills, PA-C ED   11/29/2016 0118 12/01/2016 1956 Full Code [id number]  Etta Quill, DO ED   07/16/2015 1144 07/16/2015 2152 Full Code [id number]  Ashok Pall, MD Inpatient   01/11/2015 0007 01/25/2015 1815 Full Code [id number]  Germain Osgood, PA-C ED   04/09/2013 2008 04/19/2013 2001 Full Code [id number]  Allie Bossier, MD Inpatient       Prognosis:   Unable to determine  Discharge Planning:  To Be Determined   Paged Dr. Theodoro Grist. Staffed with Dr. Hilma Favors pain mgt options   Thank you for allowing the  Palliative Medicine Team to assist in the care of this patient.   Time In: 1000 Time Out: 1045 Total Time 45 min Prolonged Time Billed  no       Greater than 50%  of this time was spent counseling and coordinating care related to the above assessment and plan.  Dory Horn, NP  Please contact Palliative Medicine Team phone at [phone] for questions and concerns.

## 2017-11-27 NOTE — Progress Notes (Addendum)
  Speech Language Pathology Treatment: Dysphagia  Patient Details Name: [name] MRN: [id number] DOB: 07/07/1961 Today's Date: 11/27/2017 Time: 0940-1000 SLP Time Calculation (min) (ACUTE ONLY): 20 min  Assessment / Plan / Recommendation Clinical Impression  Pt is currently NPO for procedure, but reports no difficulty with advanced (regular) diet and thin liquids. SLP reviewed routine safe swallow precautions and encouraged pt to adhere to them. No further ST intervention is recommended at this time. Please reconsult if needs arise.    HPI HPI: Pt is a 57 yo male who presented with decompensated cirrhosis with hepatic encephalopathy. PMHx includes DM, PTSD, compression fx's and pain/ debilitation, GERD, EtOH abuse with cirrhosis. Pt recently hospitalized in Feb 2019 for flu. Previously seen by SLP in 2016 following intubation, with FEES revealing reversible dysphagia and pt was eventually d/c from SLP on regular solid and thin liquid diet. Intubated 3/17-3/20 and re-intubated 3/25-3/28.       SLP Plan  Discharge SLP treatment due to goals met       Recommendations  Diet recommendations: Regular;Thin liquid Liquids provided via: Straw;Cup Medication Administration: Whole meds with liquid Supervision: Patient able to self feed Compensations: Minimize environmental distractions;Slow rate;Small sips/bites Postural Changes and/or Swallow Maneuvers: Seated upright 90 degrees                Oral Care Recommendations: Oral care BID Follow up Recommendations: None SLP Visit Diagnosis: Dysphagia, oropharyngeal phase (R13.12) Plan: Discharge SLP treatment due to (comment)       GO           Nima Bamburg B. Quentin Ore [facility], CCC-SLP Speech Language Pathologist [phone]  Shonna Chock 11/27/2017, 10:08 AM

## 2017-11-28 LAB — CBC
HCT: 30.5 % — ABNORMAL LOW (ref 39.0–52.0)
HEMOGLOBIN: 9.2 g/dL — AB (ref 13.0–17.0)
MCH: 35.9 pg — ABNORMAL HIGH (ref 26.0–34.0)
MCHC: 30.2 g/dL (ref 30.0–36.0)
MCV: 119.1 fL — AB (ref 78.0–100.0)
Platelets: 69 10*3/uL — ABNORMAL LOW (ref 150–400)
RBC: 2.56 MIL/uL — AB (ref 4.22–5.81)
RDW: 15.5 % (ref 11.5–15.5)
WBC: 6.4 10*3/uL (ref 4.0–10.5)

## 2017-11-28 LAB — RENAL FUNCTION PANEL
Albumin: 3.6 g/dL (ref 3.5–5.0)
Anion gap: 14 (ref 5–15)
BUN: 12 mg/dL (ref 6–20)
CHLORIDE: 104 mmol/L (ref 101–111)
CO2: 22 mmol/L (ref 22–32)
CREATININE: 5.37 mg/dL — AB (ref 0.61–1.24)
Calcium: 8.9 mg/dL (ref 8.9–10.3)
GFR calc Af Amer: 13 mL/min — ABNORMAL LOW (ref >60)
GFR calc non Af Amer: 11 mL/min — ABNORMAL LOW (ref >60)
GLUCOSE: 300 mg/dL — AB (ref 65–99)
Phosphorus: 4 mg/dL (ref 2.5–4.6)
Potassium: 3.9 mmol/L (ref 3.5–5.1)
Sodium: 140 mmol/L (ref 135–145)

## 2017-11-28 LAB — GLUCOSE, CAPILLARY
GLUCOSE-CAPILLARY: 229 mg/dL — AB (ref 65–99)
Glucose-Capillary: 225 mg/dL — ABNORMAL HIGH (ref 65–99)
Glucose-Capillary: 289 mg/dL — ABNORMAL HIGH (ref 65–99)

## 2017-11-28 MED ORDER — MORPHINE SULFATE (PF) 4 MG/ML IV SOLN: 2.0000 mg | Freq: Once | INTRAVENOUS | Status: DC

## 2017-11-28 MED ORDER — MIDODRINE HCL 5 MG PO TABS
ORAL_TABLET | ORAL | Status: AC
  Administered 2017-11-28: 10 mg via ORAL
  Filled 2017-11-28: qty 2

## 2017-11-28 MED ORDER — FENTANYL 12 MCG/HR TD PT72
12.5000 ug | MEDICATED_PATCH | TRANSDERMAL | Status: DC
  Administered 2017-11-28 – 2017-12-13 (×6): 12.5 ug via TRANSDERMAL
  Filled 2017-11-28 (×6): qty 1

## 2017-11-28 MED ORDER — ALBUMIN HUMAN 25 % IV SOLN
INTRAVENOUS | Status: AC
  Administered 2017-11-28: 93.5 mL via INTRAVENOUS
  Filled 2017-11-28: qty 100

## 2017-11-28 NOTE — Progress Notes (Signed)
1345 received pt from dialysis, dressing clean and intact. Pt A&O x4, complaining of chronic lower back pain.

## 2017-11-28 NOTE — Evaluation (Signed)
Physical Therapy Evaluation Patient Details Name: [name] MRN: [id number] DOB: 07/14/1961 Today's Date: 11/28/2017   History of Present Illness  Pt adm with abd swelling and lethargy and found to have acute hepatic encephalopathy. Pt intubated for airway protection  3/17-3/20. Pt also with acute kidney injury. PMH - DM, PTSD, compression fx's and pain/ debilitation, GERD, EtOH abuse with cirrhosis  Clinical Impression  Pt admitted with above diagnosis. Pt currently with functional limitations due to the deficits listed below (see PT Problem List). Pt was unable to do much today after dialysis.  Pt somewhat lethargic but did answer questions.  Attempted to sit EOB but pt just too weak and sleepy therefore laid back down.  A few exercises with pt and determined to let him rest.  Will need SNF for rehab to recover when medically stable.   Pt will benefit from skilled PT to increase their independence and safety with mobility to allow discharge to the venue listed below.      Follow Up Recommendations SNF    Equipment Recommendations  None recommended by PT    Recommendations for Other Services       Precautions / Restrictions Precautions Precautions: Fall Restrictions Weight Bearing Restrictions: No      Mobility  Bed Mobility Overal bed mobility: Needs Assistance Bed Mobility: Supine to Sit;Sit to Supine     Supine to sit: +2 for physical assistance;Mod assist Sit to supine: +2 for physical assistance;Total assist   General bed mobility comments: Assist to bring legs off bed, elevate trunk into sitting, but pt states he can't sit up because he is too weak after dialysis. Assist for all aspects to return to supine.  Transfers                    Ambulation/Gait                Stairs            Wheelchair Mobility    Modified Rankin (Stroke Patients Only)       Balance                                              Pertinent Vitals/Pain Pain Assessment: Faces Faces Pain Scale: Hurts even more Pain Location: back Pain Descriptors / Indicators: Moaning;Grimacing Pain Intervention(s): Limited activity within patient's tolerance;Monitored during session;Repositioned    Home Living Family/patient expects to be discharged to:: Private residence Living Arrangements: Spouse/significant other Available Help at Discharge: Family Type of Home: House Home Access: Stairs to enter Entrance Stairs-Rails: Right Entrance Stairs-Number of Steps: 2 Home Layout: One level Home Equipment: Environmental consultantWalker - 2 wheels;Cane - single point;Wheelchair - manual      Prior Function Level of Independence: Needs assistance   Gait / Transfers Assistance Needed: Amb modified independent with cane  ADL's / Homemaking Assistance Needed: [place] when wife is in the house for safety.  Does not drive.        Hand Dominance   Dominant Hand: Right    Extremity/Trunk Assessment   Upper Extremity Assessment Upper Extremity Assessment: Generalized weakness    Lower Extremity Assessment Lower Extremity Assessment: Generalized weakness;RLE deficits/detail;LLE deficits/detail RLE Deficits / Details: grossly 3/5 RLE Sensation: history of peripheral neuropathy LLE Deficits / Details: grossly 3/5 LLE Sensation: history of peripheral neuropathy    Cervical / Trunk Assessment Cervical /  Trunk Assessment: Kyphotic  Communication      Cognition Arousal/Alertness: Awake/alert Behavior During Therapy: Flat affect Overall Cognitive Status: Impaired/Different from baseline Area of Impairment: Orientation;Attention;Memory;Following commands;Safety/judgement;Problem solving                 Orientation Level: Disoriented to;Situation;Time Current Attention Level: Sustained Memory: Decreased short-term memory;Decreased recall of precautions Following Commands: Follows one step commands with increased time Safety/Judgement:  Decreased awareness of safety;Decreased awareness of deficits   Problem Solving: Slow processing;Requires verbal cues;Difficulty sequencing;Requires tactile cues        General Comments General comments (skin integrity, edema, etc.): Pt states "I am wore out because they left me in the corner in the dialysis room"    Exercises General Exercises - Upper Extremity Shoulder Flexion: AROM;Both;5 reps;Supine Elbow Flexion: AROM;Both;5 reps;Supine General Exercises - Lower Extremity Ankle Circles/Pumps: AROM;Both;5 reps;Supine Quad Sets: AROM;Both;5 reps;Supine Heel Slides: AROM;Both;5 reps;Supine   Assessment/Plan    PT Assessment Patient needs continued PT services  PT Problem List Decreased strength;Decreased activity tolerance;Decreased balance;Decreased mobility;Decreased cognition       PT Treatment Interventions DME instruction;Gait training;Functional mobility training;Therapeutic activities;Therapeutic exercise;Balance training;Patient/family education;Cognitive remediation    PT Goals (Current goals can be found in the Care Plan section)  Acute Rehab PT Goals Patient Stated Goal: not stated PT Goal Formulation: With patient Time For Goal Achievement: 12/12/17 Potential to Achieve Goals: Good    Frequency Min 2X/week   Barriers to discharge        Co-evaluation               AM-PAC PT "6 Clicks" Daily Activity  Outcome Measure Difficulty turning over in bed (including adjusting bedclothes, sheets and blankets)?: Unable Difficulty moving from lying on back to sitting on the side of the bed? : Unable Difficulty sitting down on and standing up from a chair with arms (e.g., wheelchair, bedside commode, etc,.)?: Unable Help needed moving to and from a bed to chair (including a wheelchair)?: Total Help needed walking in hospital room?: Total Help needed climbing 3-5 steps with a railing? : Total 6 Click Score: 6    End of Session Equipment Utilized During  Treatment: Gait belt Activity Tolerance: Patient limited by fatigue Patient left: in bed;with call bell/phone within reach;with bed alarm set Nurse Communication: Mobility status PT Visit Diagnosis: Unsteadiness on feet (R26.81);Other abnormalities of gait and mobility (R26.89);Muscle weakness (generalized) (M62.81)    Time: 1520-1530 PT Time Calculation (min) (ACUTE ONLY): 10 min   Charges:   PT Evaluation $PT Eval Moderate Complexity: 1 Mod     PT G Codes:        Kaylyn Garrow,PT Acute Rehabilitation [phone] (pager)   Berline Lopes 11/28/2017, 4:11 PM

## 2017-11-28 NOTE — Progress Notes (Signed)
PT Cancellation Note  Patient Details Name: [name] MRN: [id number] DOB: 04/11/1961   Cancelled Treatment:    Reason Eval/Treat Not Completed: Patient at procedure or test/unavailable(Pt has been in HD all am. Will check back as time allows.  )   Berline LopesDawn F Yanitza Shvartsman 11/28/2017, 11:49 AM Eber Jonesawn Dona Walby,PT Acute Rehabilitation [phone] (pager)

## 2017-11-28 NOTE — Progress Notes (Signed)
I was present at this dialysis session. I have reviewed the session itself and made appropriate changes.   Filed Weights   11/26/17 0800 11/26/17 1130 11/28/17 0512  Weight: 77.7 kg (171 lb 4.8 oz) 74.4 kg (164 lb 0.4 oz) 77.7 kg (171 lb 4.8 oz)    Recent Labs  Lab 11/28/17 0822  NA 140  K 3.9  CL 104  CO2 22  GLUCOSE 300*  BUN 12  CREATININE 5.37*  CALCIUM 8.9  PHOS 4.0    Recent Labs  Lab 11/22/17 0430 11/23/17 1533 11/24/17 0429  11/27/17 0830 11/27/17 1725 11/28/17 0630  WBC 16.9* 12.4* 8.0   < > 8.2 7.4 6.4  NEUTROABS 13.3* 9.5* 6.8  --   --   --   --   HGB 8.6* 8.4* 9.0*   < > 9.4* 9.5* 9.2*  HCT 28.9* 28.8* 29.6*   < > 31.8* 30.4* 30.5*  MCV 122.5* 122.0* 121.8*   < > 118.2* 116.9* 119.1*  PLT 36* 39* 38*   < > 66* 63* 69*   < > = values in this interval not displayed.    Scheduled Meds: . chlorhexidine  15 mL Mouth Rinse BID  . Chlorhexidine Gluconate Cloth  6 each Topical Daily  . famotidine  20 mg Oral QHS  . feeding supplement  1 Container Oral BID BM  . feeding supplement (PRO-STAT SUGAR FREE 64)  30 mL Oral BID  . folic acid  1 mg Oral Daily  . insulin aspart  0-15 Units Subcutaneous TID AC & HS  . lactulose  20 g Per Tube TID   Or  . lactulose  300 mL Rectal TID  . lidocaine  1 patch Transdermal Q24H  . mouth rinse  15 mL Mouth Rinse q12n4p  . methocarbamol  500 mg Oral TID  . midodrine  10 mg Oral TID WC  .  morphine injection  2 mg Intravenous Once  . octreotide  200 mcg Subcutaneous BID  . QUEtiapine  25 mg Oral QHS  . rifaximin  550 mg Oral BID  . sodium chloride flush  10-40 mL Intracatheter Q12H  . thiamine  100 mg Oral Daily   Continuous Infusions: . sodium chloride Stopped (11/20/17 0900)  . sodium chloride    . sodium chloride    . albumin human     PRN Meds:.sodium chloride, sodium chloride, sodium chloride, albuterol, alteplase, heparin, heparin, lidocaine (PF), lidocaine-prilocaine, oxyCODONE, pentafluoroprop-tetrafluoroeth      Assessment/Plan:  1. AKI- in setting of decompensated cirrhosis, hypotension, presumably due to SBP and started on CVVHD 3/26-3/29/19. Also had diarrhea and has been negative with I's/O's by 5 liters since admission. No significant UOP to date. Hopefully this is related to ischemic ATN, although hepatorenal remains on the DDx. He is not a candidate for liver transplant due to ongoing alcohol consumption at the time of admission. Currently on midodrine and octreotide. 1. Remains anuric/oliguric 2. Will plan on HDagain tomorrow with longer treatment time and more UF and cont with TTS. 3. I again spokewith patient and his family that if he is not able to tolerate intermittent HD that there is nothing more we can do for him.If this is hepatorenalsyndrome, will need to get palliative care involved as he will not do well. Not sure he would be able to tolerate intermittent HD with his low BP despite midodrine. 4. Appreciate IR's assistance for tunneled HD catheter placement 2. Alcoholic cirrhosis with encephalopathy- also with rising bilirubin. On lactulose  3. Thrombocytopenia- due to #2 4. Alcohol abuse- will need rehab and ongoing treatment to prevent withdrawal.  5. Disposition- prognosis remains poor andappreciatepalliative care'shelp toset goals/limits of care.He remains optimistic and will start CLIP process and ask [facility] for permanent access placement.    Irena CordsJoseph A Sathvik Tiedt,  MD 11/28/2017, 9:07 AM

## 2017-11-28 NOTE — Progress Notes (Signed)
PROGRESS NOTE  CINQUE BEGLEY CLE:[id number] DOB: [birth year] DOA: 11/07/2017 PCP: Dettinger, Fransisca Kaufmann, MD  HPI/Recap of past 24 hours:  [name] is a 57 y.o. year old male with medical history significant for alcoholic cirrhosis, B4WH, PTSD, and chronic back pain who presented on 11/07/2017 with encephalopathy related to decompensated cirrhosis requiring admission to critical care team; he was transferred to [facility] service on 11/23/17. Prolonged hospital course included intubation on 3/16 (extubated 3/20) due to hypoxia and somnolence, recurrent hypoglycemia requiring IV dextrose, severe cellulitis of lower extremities treated with vancomycin during time in critical care. No urine output with AKI prompted discontinuation of Vancomyin( 3/20). Nephrology started trial of middorine, octreotide, and albumin on 3/23.  Palliative care has been following, patient is currently limited code ( no CPR or intubation, pressors only)   Subjective Still complaining of back pain.  Completed his HD session this morning.  States he like to speak with palliative care team again as he wants something for his back pain   Assessment/Plan: Active Problems:   Hypoxia   Acute respiratory failure with hypoxia (HCC)   Cirrhosis ([place])   Acute renal failure (ARF) (HCC)   Goals of care, counseling/discussion   Palliative care by specialist   ESRD (end stage renal disease) ([place])   Thrombocytopenia ([place])   Alcohol abuse   #Anuric Acute renal failure- most likely HRS in setting of decompensated cirrhosis.  Three-point liters removed today with dialysis.  Patient status post tunnel catheter placement on 4/5.  Added 12.5 mcg fentanyl patch to assist with back pain most prominent during dialysis after discussion with [facility] (palliative care), and Dr.Coladonato (nephrology) disease.  On IV albumin, midodrine, octreotide  #Decompensated Alcoholic cirrhosis with hepatic encephalopathy, stable. Normal mentation  and continues to remain w/o asterixis. Continue lactulose ( 20-g TID) and rifaximin regimen.    #Hyperbilirubinemia and Elevated AST, improving. Likely related to cirrhosis given hepatitis panel on admission negative and both are downtrending steadily.   #Thrombocytopenia- improving . Asymptomatic, continue to monitor on CBC  #Type 2 DM- A1C 7.6 ( 2014), stable and controlled sliding scale, monitor on CBG   #Alcohol Abuse. Discussed importance of cessation with family and patient. Folic and thiamine supplementation. Continue Seroquel  #Acute hypoxic respiratory failure, resolved. S/p 2 intubations during hospitalization likely related to inability to protect airway in setting of decompensated cirrhosis/hepatic encephalopathy and metabolic derangements from worsening renal failure earlier in admission. Continue to encourage incentive spirometry  #Acute on Chronic back pain- flare worsened by recent HD session, continue oxycodone PRN q4h severe pain, and robaxin.  12.5 mcg fentanyl patch added, will closely monitor to ensure does not negatively affect blood pressure.  Code Status: Partial Code ( NO CPR or intubation, pressors allowed)   Family Communication: No family at bedside  Disposition Plan: Continue to monitor patient's ability to tolerate dialysis. Consultants:  Pulmonology  Nephrology  Gastroenterology  Palliative Care  Interventional Radiology  Antimicrobials: Ceftriaxone 3/16 >>3.22 Vancomycin 3/17 >>3/18 Zosyn 3/24 >>3/30   Telemetry: Yes  DVT prophylaxis:  Heparin   Objective: Vitals:   11/27/17 2140 11/28/17 0512 11/28/17 0745 11/28/17 1337  BP: 104/66 104/63 103/68 106/71  Pulse: 74 74 69 (!) 59  Resp: _0 Temp: 98.3 F (36.8 C) 98.2 F (36.8 C) 98.1 F (36.7 C) 98.2 F (36.8 C)  TempSrc:   Oral Oral  SpO2: 97% 96% 96% 97%  Weight:  77.7 kg (171 lb 4.8 oz) 74.6 kg (164  lb 7.4 oz)   Height:        Intake/Output Summary (Last 24 hours)  at 11/28/2017 1448 Last data filed at 11/28/2017 1135 Gross per 24 hour  Intake 50 ml  Output 3100 ml  Net -3050 ml   Filed Weights   11/26/17 1130 11/28/17 0512 11/28/17 0745  Weight: 74.4 kg (164 lb 0.4 oz) 77.7 kg (171 lb 4.8 oz) 74.6 kg (164 lb 7.4 oz)    Exam:  Constitutional:chronically ill appearing male, lying in bed in no apparent distress Eyes: EOMI, scleral icterus, normal conjunctivae Cardiovascular: RRR no MRGs, with 1+ pitting edema Respiratory: Normal respiratory effort on room air, clear breath sounds  Abdomen:Distended, soft, non-tender, with no appreciable HSM Skin: skin breakdown with dressing on lower leg. Without skin tenting , Jaundiced Neurologic: Grossly no focal neuro deficit. Psychiatric:Appropriate affect, and mood. Mental status AAOx3  Data Reviewed: CBC: Recent Labs  Lab 11/22/17 0430 11/23/17 1533 11/24/17 0429 11/26/17 0648 11/27/17 0830 11/27/17 1725 11/28/17 0630  WBC 16.9* 12.4* 8.0 6.9 8.2 7.4 6.4  NEUTROABS 13.3* 9.5* 6.8  --   --   --   --   HGB 8.6* 8.4* 9.0* 9.1* 9.4* 9.5* 9.2*  HCT 28.9* 28.8* 29.6* 29.6* 31.8* 30.4* 30.5*  MCV 122.5* 122.0* 121.8* 119.4* 118.2* 116.9* 119.1*  PLT 36* 39* 38* 52* 66* 63* 69*   Basic Metabolic Panel: Recent Labs  Lab 11/22/17 0430 11/23/17 1533  11/24/17 0429 11/25/17 0857 11/26/17 0730 11/27/17 0830 11/27/17 1725 11/28/17 0822  NA 139  --    < > 141 140 140 139 141 140  K 4.2  --    < > 4.0 4.2 3.9 3.6 4.4 3.9  CL 104  --    < > 105 104 103 106 107 104  CO2 24  --    < > 21* 23 21* 23 19* 22  GLUCOSE 138*  --    < > 133* 203* 142* 174* 178* 300*  BUN 13  --    < > 24* _0 CREATININE 3.54*  --    < > 6.08* 4.19* 5.30* 4.26* 4.69* 5.37*  CALCIUM 8.6*  --    < > 8.7* 8.6* 8.6* 9.1 8.9 8.9  MG 2.6* 2.8*  --  2.7* 2.2  --   --   --   --   PHOS 3.6 5.2*  --  4.9*  5.0* 4.4 3.8  --  3.6 4.0   < > = values in this interval not displayed.   GFR: Estimated Creatinine Clearance: 14  mL/min (A) (by C-G formula based on SCr of 5.37 mg/dL (H)). Liver Function Tests: Recent Labs  Lab 11/22/17 0430 11/23/17 1534 11/24/17 0429 11/26/17 0619 11/26/17 0730 11/27/17 1725 11/28/17 0822  AST 86* 70*  --  73*  --   --   --   ALT 38 37  --  30  --   --   --   ALKPHOS 144* 141*  --  122  --   --   --   BILITOT 13.2* 11.6*  --  9.1*  --   --   --   PROT 6.4* 6.8  --  6.6  --   --   --   ALBUMIN 2.9* 3.4* 3.1* 3.5 3.5 3.9 3.6   No results for input(s): LIPASE, AMYLASE in the last 168 hours. Recent Labs  Lab 11/22/17 0430  AMMONIA 51*   Coagulation Profile: Recent Labs  Lab 11/22/17 0945 11/26/17 0619 11/27/17 0830  INR 1.45 1.64 1.64   Cardiac Enzymes: No results for input(s): CKTOTAL, CKMB, CKMBINDEX, TROPONINI in the last 168 hours. BNP (last 3 results) No results for input(s): PROBNP in the last 8760 hours. HbA1C: No results for input(s): HGBA1C in the last 72 hours. CBG: Recent Labs  Lab 11/27/17 0748 11/27/17 1141 11/27/17 1613 11/27/17 2138 11/28/17 1334  GLUCAP 149* 156* 180* 215* 225*   Lipid Profile: No results for input(s): CHOL, HDL, LDLCALC, TRIG, CHOLHDL, LDLDIRECT in the last 72 hours. Thyroid Function Tests: No results for input(s): TSH, T4TOTAL, FREET4, T3FREE, THYROIDAB in the last 72 hours. Anemia Panel: No results for input(s): VITAMINB12, FOLATE, FERRITIN, TIBC, IRON, RETICCTPCT in the last 72 hours. Urine analysis:    Component Value Date/Time   COLORURINE YELLOW 11/11/2017 1205   APPEARANCEUR TURBID (A) 11/11/2017 1205   LABSPEC 1.025 11/11/2017 1205   PHURINE 6.5 11/11/2017 1205   GLUCOSEU NEGATIVE 11/11/2017 1205   HGBUR LARGE (A) 11/11/2017 1205   BILIRUBINUR LARGE (A) 11/11/2017 1205   BILIRUBINUR n 02/28/2011 [place] 11/11/2017 1205   PROTEINUR >300 (A) 11/11/2017 1205   UROBILINOGEN 1.0 01/17/2015 1305   NITRITE NEGATIVE 11/11/2017 1205   LEUKOCYTESUR MODERATE (A) 11/11/2017 1205   Sepsis  Labs: _0 (procalcitonin:4,lacticidven:4)  ) No results found for this or any previous visit (from the past 240 hour(s)).    Studies: Ir Fluoro Guide Cv Line Right  Result Date: 11/27/2017 INDICATION: 57 year old male with end-stage renal disease on hemodialysis. He is currently dialyzing via a right IJ temporary hemodialysis catheter. He presents for placement of a tunneled hemodialysis catheter. EXAM: TUNNELED CENTRAL VENOUS HEMODIALYSIS CATHETER PLACEMENT WITH ULTRASOUND AND FLUOROSCOPIC GUIDANCE MEDICATIONS: 2 g Ancef. The antibiotic was given in an appropriate time interval prior to skin puncture. ANESTHESIA/SEDATION: Moderate (conscious) sedation was employed during this procedure. A total of Versed 2 mg and Fentanyl 100 mcg was administered intravenously. Moderate Sedation Time: 18 minutes. The patient's level of consciousness and vital signs were monitored continuously by radiology nursing throughout the procedure under my direct supervision. FLUOROSCOPY TIME:  Fluoroscopy Time: 0 minutes 6 seconds (0 mGy). COMPLICATIONS: None immediate. PROCEDURE: Informed written consent was obtained from the patient after a discussion of the risks, benefits, and alternatives to treatment. Questions regarding the procedure were encouraged and answered. The right neck and chest were prepped with chlorhexidine in a sterile fashion, and a sterile drape was applied covering the operative field. Maximum barrier sterile technique with sterile gowns and gloves were used for the procedure. A timeout was performed prior to the initiation of the procedure. After creating a small venotomy incision, a micropuncture kit was utilized to access the right internal jugular vein under direct, real-time ultrasound guidance after the overlying soft tissues were anesthetized with 1% lidocaine with epinephrine. Ultrasound image documentation was performed. The microwire was kinked to measure appropriate catheter length. A stiff  Glidewire was advanced to the level of the IVC and the micropuncture sheath was exchanged for a peel-away sheath. A palindrome tunneled hemodialysis catheter measuring 19 cm from tip to cuff was tunneled in a retrograde fashion from the anterior chest wall to the venotomy incision. The catheter was then placed through the peel-away sheath with tips ultimately positioned within the superior aspect of the right atrium. Final catheter positioning was confirmed and documented with a spot radiographic image. The catheter aspirates and flushes normally. The catheter was flushed with appropriate volume heparin dwells. The catheter  exit site was secured with a 0-Prolene retention suture. The venotomy incision was closed with Dermabond. Dressings were applied. The patient tolerated the procedure well without immediate post procedural complication. IMPRESSION: Successful placement of 19 cm tip to cuff tunneled hemodialysis catheter via the right internal jugular vein with tips terminating within the superior aspect of the right atrium. The catheter is ready for immediate use. The existing temporary hemodialysis catheter was removed. Signed, Criselda Peaches, MD [facility] Radiology Electronically Signed   By: Jacqulynn Cadet M.D.   On: 11/27/2017 16:56   Ir US Guide Vasc Access Right  Result Date: 11/27/2017 INDICATION: 57 year old male with end-stage renal disease on hemodialysis. He is currently dialyzing via a right IJ temporary hemodialysis catheter. He presents for placement of a tunneled hemodialysis catheter. EXAM: TUNNELED CENTRAL VENOUS HEMODIALYSIS CATHETER PLACEMENT WITH ULTRASOUND AND FLUOROSCOPIC GUIDANCE MEDICATIONS: 2 g Ancef. The antibiotic was given in an appropriate time interval prior to skin puncture. ANESTHESIA/SEDATION: Moderate (conscious) sedation was employed during this procedure. A total of Versed 2 mg and Fentanyl 100 mcg was administered  intravenously. Moderate Sedation Time: 18 minutes. The patient's level of consciousness and vital signs were monitored continuously by radiology nursing throughout the procedure under my direct supervision. FLUOROSCOPY TIME:  Fluoroscopy Time: 0 minutes 6 seconds (0 mGy). COMPLICATIONS: None immediate. PROCEDURE: Informed written consent was obtained from the patient after a discussion of the risks, benefits, and alternatives to treatment. Questions regarding the procedure were encouraged and answered. The right neck and chest were prepped with chlorhexidine in a sterile fashion, and a sterile drape was applied covering the operative field. Maximum barrier sterile technique with sterile gowns and gloves were used for the procedure. A timeout was performed prior to the initiation of the procedure. After creating a small venotomy incision, a micropuncture kit was utilized to access the right internal jugular vein under direct, real-time ultrasound guidance after the overlying soft tissues were anesthetized with 1% lidocaine with epinephrine. Ultrasound image documentation was performed. The microwire was kinked to measure appropriate catheter length. A stiff Glidewire was advanced to the level of the IVC and the micropuncture sheath was exchanged for a peel-away sheath. A palindrome tunneled hemodialysis catheter measuring 19 cm from tip to cuff was tunneled in a retrograde fashion from the anterior chest wall to the venotomy incision. The catheter was then placed through the peel-away sheath with tips ultimately positioned within the superior aspect of the right atrium. Final catheter positioning was confirmed and documented with a spot radiographic image. The catheter aspirates and flushes normally. The catheter was flushed with appropriate volume heparin dwells. The catheter exit site was secured with a 0-Prolene retention suture. The venotomy incision was closed with Dermabond. Dressings were applied. The patient  tolerated the procedure well without immediate post procedural complication. IMPRESSION: Successful placement of 19 cm tip to cuff tunneled hemodialysis catheter via the right internal jugular vein with tips terminating within the superior aspect of the right atrium. The catheter is ready for immediate use. The existing temporary hemodialysis catheter was removed. Signed, Criselda Peaches, MD [facility] Radiology Electronically Signed   By: Jacqulynn Cadet M.D.   On: 11/27/2017 16:56    Scheduled Meds: . chlorhexidine  15 mL Mouth Rinse BID  . Chlorhexidine Gluconate Cloth  6 each Topical Daily  . famotidine  20 mg Oral QHS  . feeding supplement  1 Container Oral BID BM  . feeding supplement (  PRO-STAT SUGAR FREE 64)  30 mL Oral BID  . fentaNYL  12.5 mcg Transdermal Q72H  . folic acid  1 mg Oral Daily  . insulin aspart  0-15 Units Subcutaneous TID AC & HS  . lactulose  20 g Per Tube TID   Or  . lactulose  300 mL Rectal TID  . lidocaine  1 patch Transdermal Q24H  . mouth rinse  15 mL Mouth Rinse q12n4p  . methocarbamol  500 mg Oral TID  . midodrine  10 mg Oral TID WC  .  morphine injection  2 mg Intravenous Once  . octreotide  200 mcg Subcutaneous BID  . QUEtiapine  25 mg Oral QHS  . rifaximin  550 mg Oral BID  . sodium chloride flush  10-40 mL Intracatheter Q12H  . thiamine  100 mg Oral Daily    Continuous Infusions: . sodium chloride Stopped (11/20/17 0900)  . albumin human       LOS: 21 days     Desiree Hane, MD [facility] Pager [phone]  If 7PM-7AM, please contact night-coverage [email or web address] Password [email or web address] 11/28/2017, 2:48 PM

## 2017-11-28 NOTE — Progress Notes (Signed)
Pt's new HD cath stopped bleeding. Paged IV to assess and change dressing.

## 2017-11-29 DIAGNOSIS — R52 Pain, unspecified: Secondary | ICD-10-CM

## 2017-11-29 DIAGNOSIS — K7030 Alcoholic cirrhosis of liver without ascites: Secondary | ICD-10-CM

## 2017-11-29 LAB — CBC
HCT: 32.4 % — ABNORMAL LOW (ref 39.0–52.0)
HEMOGLOBIN: 9.7 g/dL — AB (ref 13.0–17.0)
MCH: 35.1 pg — AB (ref 26.0–34.0)
MCHC: 29.9 g/dL — ABNORMAL LOW (ref 30.0–36.0)
MCV: 117.4 fL — ABNORMAL HIGH (ref 78.0–100.0)
Platelets: 88 10*3/uL — ABNORMAL LOW (ref 150–400)
RBC: 2.76 MIL/uL — AB (ref 4.22–5.81)
RDW: 15.1 % (ref 11.5–15.5)
WBC: 9.4 10*3/uL (ref 4.0–10.5)

## 2017-11-29 LAB — GLUCOSE, CAPILLARY
GLUCOSE-CAPILLARY: 258 mg/dL — AB (ref 65–99)
GLUCOSE-CAPILLARY: 236 mg/dL — AB (ref 65–99)
GLUCOSE-CAPILLARY: 311 mg/dL — AB (ref 65–99)
GLUCOSE-CAPILLARY: 197 mg/dL — AB (ref 65–99)

## 2017-11-29 NOTE — Progress Notes (Addendum)
Patient ID: [name], male   DOB: 08/29/1960, 57 y.o.   MRN: [id number]   Patient seen, chart reviewed.  He is up to the recliner, alert and oriented x3.  We discussed at length new pain management regimen: Fentanyl transdermal 12.5 mcg every 72 hours with oxycodone 5-10 mg every 6 hours as needed for breakthrough pain.  Patient shares with me that fentanyl "scares him".  We discussed the fentanyl products that have been in the news recently and how those are not equivalent and safety or nature to what is being prescribed for him.  He is not experiencing any undue sedation.  I also educated him on the benefits of fentanyl as follows: Less likely to impact his blood pressure negatively, as well as being tolerated in kidney failure.  Patient has utilized only 1 breakthrough dose of oxycodone in the past 24 hours since fentanyl transdermal was started on 11/28/2017.  We talked some about upcoming challenges he will face principally pursuing sobriety from alcohol.  His wife later joined us.  I did fill out FMLA paperwork for her as she has been out of work secondary to her husband's illness.  Patient continues as a partial code: Pressors and BiPAP only  Pain: Continue with fentanyl transdermal 12.5 mcg every 72 hours.  Palliative medicine team to reassess pain management in 3 days to see if he is tolerating long-acting opioids in terms of blood pressure and hemodialysis.  Continue with current plan of care.  Anticipate the patient will need physical rehab upon discharge from hospital as well as engagement with gastroenterologist, nephrologist in the community  Palliative medicine to stay involved in support patient and family as well as engage in symptom management  Eduard RouxSarah Job Holtsclaw, ANP-AC HPN Time in: 08 30 Time out: 09 15 Total time: 45 minutes

## 2017-11-29 NOTE — Progress Notes (Deleted)
Patient ID: [name], male   DOB: 07/17/1961, 57 y.o.   MRN: [id number]

## 2017-11-29 NOTE — Progress Notes (Signed)
S: Continues to feel "rough" after HD and remains in chronic pain O:BP 96/62 (BP Location: Right Arm)   Pulse 78   Temp 99.3 F (37.4 C) (Oral)   Resp 17   Ht _0  (1.676 m)   Wt 71.8 kg (158 lb 4.6 oz)   SpO2 93%   BMI 25.55 kg/m  No intake or output data in the 24 hours ending 11/29/17 1210 Intake/Output: I/O last 3 completed shifts: In: 46 [P.O.:50] Out: 3100 [Other:3100]  Intake/Output this shift:  No intake/output data recorded. Weight change: -3.1 kg (-6 lb 13.4 oz) Gen:+jaundice NAD CVS: no rub Resp: decreased BS at bases TIW:PYKDXIPJA, +BS, soft, NT Ext: 1+ edema lower ext  Recent Labs  Lab 11/23/17 1533 11/23/17 1534 11/24/17 0429 11/25/17 0857 11/26/17 0619 11/26/17 0730 11/27/17 0830 11/27/17 1725 11/28/17 0822  NA  --  141 141 140  --  140 139 141 140  K  --  4.2 4.0 4.2  --  3.9 3.6 4.4 3.9  CL  --  104 105 104  --  103 106 107 104  CO2  --  19* 21* 23  --  21* 23 19* 22  GLUCOSE  --  296* 133* 203*  --  142* 174* 178* 300*  BUN  --  22* 24* 9  --  _1 CREATININE  --  5.68* 6.08* 4.19*  --  5.30* 4.26* 4.69* 5.37*  ALBUMIN  --  3.4* 3.1*  --  3.5 3.5  --  3.9 3.6  CALCIUM  --  9.0 8.7* 8.6*  --  8.6* 9.1 8.9 8.9  PHOS 5.2*  --  4.9*  5.0* 4.4  --  3.8  --  3.6 4.0  AST  --  70*  --   --  73*  --   --   --   --   ALT  --  37  --   --  30  --   --   --   --    Liver Function Tests: Recent Labs  Lab 11/23/17 1534  11/26/17 0619 11/26/17 0730 11/27/17 1725 11/28/17 0822  AST 70*  --  73*  --   --   --   ALT 37  --  30  --   --   --   ALKPHOS 141*  --  122  --   --   --   BILITOT 11.6*  --  9.1*  --   --   --   PROT 6.8  --  6.6  --   --   --   ALBUMIN 3.4*   < > 3.5 3.5 3.9 3.6   < > = values in this interval not displayed.   No results for input(s): LIPASE, AMYLASE in the last 168 hours. No results for input(s): AMMONIA in the last 168 hours. CBC: Recent Labs  Lab 11/23/17 1533 11/24/17 0429 11/26/17 0648 11/27/17 0830  11/27/17 1725 11/28/17 0630 11/29/17 0707  WBC 12.4* 8.0 6.9 8.2 7.4 6.4 9.4  NEUTROABS 9.5* 6.8  --   --   --   --   --   HGB 8.4* 9.0* 9.1* 9.4* 9.5* 9.2* 9.7*  HCT 28.8* 29.6* 29.6* 31.8* 30.4* 30.5* 32.4*  MCV 122.0* 121.8* 119.4* 118.2* 116.9* 119.1* 117.4*  PLT 39* 38* 52* 66* 63* 69* 88*   Cardiac Enzymes: No results for input(s): CKTOTAL, CKMB, CKMBINDEX, TROPONINI in the last 168 hours. CBG: Recent Labs  Lab  11/28/17 1334 11/28/17 1712 11/28/17 2148 11/29/17 0820 11/29/17 1205  GLUCAP 225* 289* 229* 258* 236*    Iron Studies: No results for input(s): IRON, TIBC, TRANSFERRIN, FERRITIN in the last 72 hours. Studies/Results: Ir Fluoro Guide Cv Line Right  Result Date: 11/27/2017 INDICATION: 57 year old male with end-stage renal disease on hemodialysis. He is currently dialyzing via a right IJ temporary hemodialysis catheter. He presents for placement of a tunneled hemodialysis catheter. EXAM: TUNNELED CENTRAL VENOUS HEMODIALYSIS CATHETER PLACEMENT WITH ULTRASOUND AND FLUOROSCOPIC GUIDANCE MEDICATIONS: 2 g Ancef. The antibiotic was given in an appropriate time interval prior to skin puncture. ANESTHESIA/SEDATION: Moderate (conscious) sedation was employed during this procedure. A total of Versed 2 mg and Fentanyl 100 mcg was administered intravenously. Moderate Sedation Time: 18 minutes. The patient's level of consciousness and vital signs were monitored continuously by radiology nursing throughout the procedure under my direct supervision. FLUOROSCOPY TIME:  Fluoroscopy Time: 0 minutes 6 seconds (0 mGy). COMPLICATIONS: None immediate. PROCEDURE: Informed written consent was obtained from the patient after a discussion of the risks, benefits, and alternatives to treatment. Questions regarding the procedure were encouraged and answered. The right neck and chest were prepped with chlorhexidine in a sterile fashion, and a sterile drape was applied covering the operative field. Maximum  barrier sterile technique with sterile gowns and gloves were used for the procedure. A timeout was performed prior to the initiation of the procedure. After creating a small venotomy incision, a micropuncture kit was utilized to access the right internal jugular vein under direct, real-time ultrasound guidance after the overlying soft tissues were anesthetized with 1% lidocaine with epinephrine. Ultrasound image documentation was performed. The microwire was kinked to measure appropriate catheter length. A stiff Glidewire was advanced to the level of the IVC and the micropuncture sheath was exchanged for a peel-away sheath. A palindrome tunneled hemodialysis catheter measuring 19 cm from tip to cuff was tunneled in a retrograde fashion from the anterior chest wall to the venotomy incision. The catheter was then placed through the peel-away sheath with tips ultimately positioned within the superior aspect of the right atrium. Final catheter positioning was confirmed and documented with a spot radiographic image. The catheter aspirates and flushes normally. The catheter was flushed with appropriate volume heparin dwells. The catheter exit site was secured with a 0-Prolene retention suture. The venotomy incision was closed with Dermabond. Dressings were applied. The patient tolerated the procedure well without immediate post procedural complication. IMPRESSION: Successful placement of 19 cm tip to cuff tunneled hemodialysis catheter via the right internal jugular vein with tips terminating within the superior aspect of the right atrium. The catheter is ready for immediate use. The existing temporary hemodialysis catheter was removed. Signed, Criselda Peaches, MD [facility] Radiology Electronically Signed   By: Jacqulynn Cadet M.D.   On: 11/27/2017 16:56   Ir US Guide Vasc Access Right  Result Date: 11/27/2017 INDICATION: 57 year old male with end-stage renal  disease on hemodialysis. He is currently dialyzing via a right IJ temporary hemodialysis catheter. He presents for placement of a tunneled hemodialysis catheter. EXAM: TUNNELED CENTRAL VENOUS HEMODIALYSIS CATHETER PLACEMENT WITH ULTRASOUND AND FLUOROSCOPIC GUIDANCE MEDICATIONS: 2 g Ancef. The antibiotic was given in an appropriate time interval prior to skin puncture. ANESTHESIA/SEDATION: Moderate (conscious) sedation was employed during this procedure. A total of Versed 2 mg and Fentanyl 100 mcg was administered intravenously. Moderate Sedation Time: 18 minutes. The patient's level of consciousness and vital signs were monitored continuously by radiology  nursing throughout the procedure under my direct supervision. FLUOROSCOPY TIME:  Fluoroscopy Time: 0 minutes 6 seconds (0 mGy). COMPLICATIONS: None immediate. PROCEDURE: Informed written consent was obtained from the patient after a discussion of the risks, benefits, and alternatives to treatment. Questions regarding the procedure were encouraged and answered. The right neck and chest were prepped with chlorhexidine in a sterile fashion, and a sterile drape was applied covering the operative field. Maximum barrier sterile technique with sterile gowns and gloves were used for the procedure. A timeout was performed prior to the initiation of the procedure. After creating a small venotomy incision, a micropuncture kit was utilized to access the right internal jugular vein under direct, real-time ultrasound guidance after the overlying soft tissues were anesthetized with 1% lidocaine with epinephrine. Ultrasound image documentation was performed. The microwire was kinked to measure appropriate catheter length. A stiff Glidewire was advanced to the level of the IVC and the micropuncture sheath was exchanged for a peel-away sheath. A palindrome tunneled hemodialysis catheter measuring 19 cm from tip to cuff was tunneled in a retrograde fashion from the anterior chest  wall to the venotomy incision. The catheter was then placed through the peel-away sheath with tips ultimately positioned within the superior aspect of the right atrium. Final catheter positioning was confirmed and documented with a spot radiographic image. The catheter aspirates and flushes normally. The catheter was flushed with appropriate volume heparin dwells. The catheter exit site was secured with a 0-Prolene retention suture. The venotomy incision was closed with Dermabond. Dressings were applied. The patient tolerated the procedure well without immediate post procedural complication. IMPRESSION: Successful placement of 19 cm tip to cuff tunneled hemodialysis catheter via the right internal jugular vein with tips terminating within the superior aspect of the right atrium. The catheter is ready for immediate use. The existing temporary hemodialysis catheter was removed. Signed, Criselda Peaches, MD [facility] Radiology Electronically Signed   By: Jacqulynn Cadet M.D.   On: 11/27/2017 16:56   . chlorhexidine  15 mL Mouth Rinse BID  . Chlorhexidine Gluconate Cloth  6 each Topical Daily  . famotidine  20 mg Oral QHS  . feeding supplement  1 Container Oral BID BM  . feeding supplement (PRO-STAT SUGAR FREE 64)  30 mL Oral BID  . fentaNYL  12.5 mcg Transdermal Q72H  . folic acid  1 mg Oral Daily  . insulin aspart  0-15 Units Subcutaneous TID AC & HS  . lactulose  20 g Per Tube TID   Or  . lactulose  300 mL Rectal TID  . lidocaine  1 patch Transdermal Q24H  . mouth rinse  15 mL Mouth Rinse q12n4p  . methocarbamol  500 mg Oral TID  . midodrine  10 mg Oral TID WC  .  morphine injection  2 mg Intravenous Once  . octreotide  200 mcg Subcutaneous BID  . QUEtiapine  25 mg Oral QHS  . rifaximin  550 mg Oral BID  . sodium chloride flush  10-40 mL Intracatheter Q12H  . thiamine  100 mg Oral Daily    BMET    Component Value Date/Time   NA 140  11/28/2017 0822   NA 137 10/22/2017 1542   K 3.9 11/28/2017 0822   CL 104 11/28/2017 0822   CO2 22 11/28/2017 0822   GLUCOSE 300 (H) 11/28/2017 0822   BUN 12 11/28/2017 0822   BUN 5 (L) 10/22/2017 1542   CREATININE 5.37 (H) 11/28/2017 4034  CALCIUM 8.9 11/28/2017 0822   CALCIUM (LL) 04/07/2007 1517    5.5 Result repeated and verified. CRITICAL RESULT CALLED TO, READ BACK BY AND VERIFIED WITH: HUDGSON,M. RN 04/07/07 1031 WAYK CORRECTED ON 08/15 AT 0024: PREVIOUSLY REPORTED AS 5.5 Result repeated and verified.   GFRNONAA 11 (L) 11/28/2017 0822   GFRAA 13 (L) 11/28/2017 0822   CBC    Component Value Date/Time   WBC 9.4 11/29/2017 0707   RBC 2.76 (L) 11/29/2017 0707   HGB 9.7 (L) 11/29/2017 0707   HGB 13.1 10/22/2017 1542   HCT 32.4 (L) 11/29/2017 0707   HCT 40.3 10/22/2017 1542   PLT 88 (L) 11/29/2017 0707   PLT 107 (L) 10/22/2017 1542   MCV 117.4 (H) 11/29/2017 0707   MCV 110 (H) 10/22/2017 1542   MCH 35.1 (H) 11/29/2017 0707   MCHC 29.9 (L) 11/29/2017 0707   RDW 15.1 11/29/2017 0707   RDW 15.1 10/22/2017 1542   LYMPHSABS 0.8 11/24/2017 0429   LYMPHSABS 1.4 10/22/2017 1542   MONOABS 0.4 11/24/2017 0429   EOSABS 0.0 11/24/2017 0429   EOSABS 0.0 10/22/2017 1542   BASOSABS 0.0 11/24/2017 0429   BASOSABS 0.1 10/22/2017 1542     Assessment/Plan:  1. AKI- in setting of decompensated cirrhosis, hypotension, presumably due to SBP and started on CVVHD 3/26-3/29/19. Also had diarrhea and has been negative with I's/O's by 5 liters since admission. No significant UOP to date. Hopefully this is related to ischemic ATN, although hepatorenal remains on the DDx. He is not a candidate for liver transplant due to ongoing alcohol consumption at the time of admission. Currently on midodrine and octreotide. 1. Remains anuric/oliguric 2. [facility] tomorrow with longer treatment time and more UFand cont with TTS. 3. I again spokewith patient and his family that if he is  not able to tolerate intermittent HD that there is nothing more we can do for him.If this is hepatorenalsyndrome, will need to get palliative care involved as he will not do well. Not sure he would be able to tolerate intermittent HD with his low BP despite midodrine but able to UF 3 liters yesterday 4. Appreciate IR's assistance for tunneled HD catheter placement 2. Alcoholic cirrhosis with encephalopathy- also with rising bilirubin. On lactulose 3. Thrombocytopenia- due to #2 4. Alcohol abuse- will need rehab and ongoing treatment to prevent withdrawal.  5. Disposition- prognosis remains poor andappreciatepalliative care'shelp toset goals/limits of care.He remains optimistic and will start CLIP process and ask [facility] for permanent access placement.   Donetta Potts, MD [employer] [phone]

## 2017-11-30 LAB — CBC
HCT: 31.1 % — ABNORMAL LOW (ref 39.0–52.0)
HEMOGLOBIN: 9.1 g/dL — AB (ref 13.0–17.0)
MCH: 34.3 pg — ABNORMAL HIGH (ref 26.0–34.0)
MCHC: 29.3 g/dL — ABNORMAL LOW (ref 30.0–36.0)
MCV: 117.4 fL — ABNORMAL HIGH (ref 78.0–100.0)
Platelets: 86 10*3/uL — ABNORMAL LOW (ref 150–400)
RBC: 2.65 MIL/uL — AB (ref 4.22–5.81)
RDW: 15 % (ref 11.5–15.5)
WBC: 6.8 10*3/uL (ref 4.0–10.5)

## 2017-11-30 LAB — COMPREHENSIVE METABOLIC PANEL
ALT: 14 U/L — AB (ref 17–63)
AST: 56 U/L — AB (ref 15–41)
Albumin: 4 g/dL (ref 3.5–5.0)
Alkaline Phosphatase: 103 U/L (ref 38–126)
Anion gap: 13 (ref 5–15)
BUN: 16 mg/dL (ref 6–20)
CHLORIDE: 103 mmol/L (ref 101–111)
CO2: 22 mmol/L (ref 22–32)
CREATININE: 5.47 mg/dL — AB (ref 0.61–1.24)
Calcium: 9.3 mg/dL (ref 8.9–10.3)
GFR calc Af Amer: 12 mL/min — ABNORMAL LOW (ref >60)
GFR calc non Af Amer: 11 mL/min — ABNORMAL LOW (ref >60)
GLUCOSE: 351 mg/dL — AB (ref 65–99)
Potassium: 3.7 mmol/L (ref 3.5–5.1)
SODIUM: 138 mmol/L (ref 135–145)
Total Bilirubin: 7.9 mg/dL — ABNORMAL HIGH (ref 0.3–1.2)
Total Protein: 7.1 g/dL (ref 6.5–8.1)

## 2017-11-30 LAB — GLUCOSE, CAPILLARY
GLUCOSE-CAPILLARY: 185 mg/dL — AB (ref 65–99)
Glucose-Capillary: 317 mg/dL — ABNORMAL HIGH (ref 65–99)
Glucose-Capillary: 386 mg/dL — ABNORMAL HIGH (ref 65–99)

## 2017-11-30 LAB — PHOSPHORUS: PHOSPHORUS: 3 mg/dL (ref 2.5–4.6)

## 2017-11-30 LAB — HEMOGLOBIN A1C
Hgb A1c MFr Bld: 4.4 % — ABNORMAL LOW (ref 4.8–5.6)
MEAN PLASMA GLUCOSE: 79.58 mg/dL

## 2017-11-30 MED ORDER — CALCIUM CARBONATE ANTACID 1250 MG/5ML PO SUSP
500.0000 mg | Freq: Four times a day (QID) | ORAL | Status: DC | PRN
  Administered 2017-11-30: 500 mg via ORAL
  Filled 2017-11-30: qty 5

## 2017-11-30 NOTE — Plan of Care (Signed)
All needs met at this  time.

## 2017-11-30 NOTE — Progress Notes (Signed)
PROGRESS NOTE  MAURIO BAIZE RUE:[id number] DOB: Oct 09, 1960 DOA: 11/07/2017 PCP: Dettinger, Elige Radon, MD  HPI/Recap of past 24 hours:  [name] is a 57 y.o. year old male with medical history significant for alcoholic cirrhosis, T2DM, PTSD, and chronic back pain who presented on 11/07/2017 with 2 weeks of abdominal swelling, worsening jaundice and somnolence and was found to have decompensated cirrhosis prompting admission to critical care for hepatic encephalopathy,hypoglycemia related to continued insulin therapy despite decreased PO intake, presumed SBP ( no paracentesis with insufficient ascites), INR coagulopathy and and eventually intubated (3/16) for inability to protect airway.   Prolonged hospital course included intubation on 3/16 (extubated 3/20) due to hypoxia and somnolence, empiric SBP therapy ( not enough asites to tap)recurrent hypoglycemia requiring IV dextrose, severe cellulitis of lower extremities treated with vancomycin during time in critical care, anuric AKI/ATN prompted discontinuation of Vancomyin( 3/20), empiric IV albumin/midodrine/octreotide treatment (3/23-4/8), CRRT and eventually transition to intermittent HD( 4/2). He was transferred to [facility] service on 11/23/17 for further management and has been undergoing intermittent HD. Palliative is on board as patient has poor prognosis and unclear if he can tolerated HD long term.   Subjective Says back pain better after BM yesterday. No acute complaints. Denies any chest pain or SOB.    Assessment/Plan: Active Problems:   Hypoxia   Acute respiratory failure with hypoxia (HCC)   Cirrhosis (HCC)   Acute renal failure (ARF) (HCC)   Goals of care, counseling/discussion   Palliative care by specialist   ESRD (end stage renal disease) (HCC)   Thrombocytopenia (HCC)   Alcohol abuse   Pain, generalized  #Anuric Acute renal failure - Presumed hemodynamically mediated/ ischemic ATN ( hypotension from SBP, ?some element  of vancomycin toxicity which he was on for severe cellulitis for his lower extremities, some prior concern for HRS and had trial of albumin, midodrine, octreotride 3/23-4/8) - CRRT 3/26-3/29, s/p tunneled cath placement on 4/5, -13 L this admission - His BP has been tolerating intermittent HD this past week - midodrine (less for HRS and more helpful in BP on iHD), octreotide, d/c'd albumin ( by nephro) - poor prognosis given unclear patient can tolerate long term HD with his BP, palliative medicine following - 12.5 mcg fentanyl patch to assist w/ back pain most prominent after HD sessions ( started on 4/7 after discussing with Glenna Fellows (palliative care), and Dr.Coladonato (nephrology) disease. We do NOT plan on discharging patient home on a fentanyl patch   #Decompensated Alcoholic cirrhosis with hepatic encephalopathy, stable.  -Normal mentation and continues to remain w/o asterixis.  -Continue lactulose ( 20-g TID) and rifaximin regimen.    #Hyperbilirubinemia and Elevated AST, improving.  -Likely related to cirrhosis given hepatitis panel on admission negative and both are downtrending steadily.   #Thrombocytopenia- improving  -secondary to cirrhosis/hypersplenism - Asymptomatic, continue to monitor on CBC  #Type 2 DM- A1C 7.6 ( 2014), uncontrolled - on admission presented with hypoglycemia requiring IV dextrose as patient was still taking insulin despite no PO intake when he was more encephalopathic at home - check A1c - currently escalating blood glucose (>300 today) previously controlled but also in setting of decreased PO intake and NPO for scheduled procedures the last few days -  For now continue sliding scale, - - hesitant to start long acting in setting of known liver and kidney disease await A1c   #Alcohol Abuse. - Discussed importance of cessation with family and patient.  -Folic and thiamine supplementation. Continue  Seroquel  #Acute hypoxic respiratory failure,  resolved.  -S/p 2 intubations during hospitalization likely related to inability to protect airway in setting of decompensated cirrhosis/hepatic encephalopathy and metabolic derangements from worsening renal failure earlier in admission. - Continue to encourage incentive spirometry  #Acute on Chronic back pain- flare worsened by recent HD session, continue oxycodone PRN q4h severe pain, and robaxin.  12.5 mcg fentanyl patch added, will closely monitor to ensure does not negatively affect blood pressure.  Code Status: Partial Code ( NO CPR or intubation, pressors allowed)   Family Communication: No family at bedside  Disposition Plan: Continue to monitor patient's ability to tolerate dialysis tentatively expect chronic HD.  Procedures:   CRRT (3/25-3/29)  HD 4/2--  Tunneled cath placement 4/5  Intubated 3/16, Extubated 3/20,    Consultants:  Pulmonology  Nephrology  Gastroenterology  Palliative Care  Interventional Radiology  Antimicrobials: Ceftriaxone 3/16 >>3.22 Vancomycin 3/17 >>3/18 Zosyn 3/24 >>3/30   Telemetry: Yes  DVT prophylaxis:  Heparin   Objective: Vitals:   11/29/17 0954 11/29/17 1411 11/29/17 2201 11/30/17 0606  BP: 96/62 115/66 105/75 (!) 97/56  Pulse: 78 66 64 (!) 58  Resp:   18 18  Temp: 99.3 F (37.4 C) 98.5 F (36.9 C) 98.1 F (36.7 C) 98.3 F (36.8 C)  TempSrc: Oral Oral Oral Oral  SpO2: 93% 97% 94% 94%  Weight:    73.1 kg (161 lb 2.5 oz)  Height:        Intake/Output Summary (Last 24 hours) at 11/30/2017 1356 Last data filed at 11/30/2017 1051 Gross per 24 hour  Intake 580 ml  Output -  Net 580 ml   Filed Weights   11/28/17 0745 11/28/17 1150 11/30/17 0606  Weight: 74.6 kg (164 lb 7.4 oz) 71.8 kg (158 lb 4.6 oz) 73.1 kg (161 lb 2.5 oz)    Exam:  Constitutional:chronically ill appearing male, lying in bed in no apparent distress Eyes: EOMI, scleral icterus, normal conjunctivae Cardiovascular: RRR no MRGs, with 1+  pitting edema Respiratory: Normal respiratory effort on room air, crackles from bases to midlung  Abdomen:Distended, soft, non-tender, with no appreciable HSM Skin: skin breakdown with dressing on lower leg. Without skin tenting , Jaundiced Neurologic: Grossly no focal neuro deficit. No asterixis Psychiatric:Appropriate affect, and mood. Mental status AAOx3  Data Reviewed: CBC: Recent Labs  Lab 11/23/17 1533 11/24/17 0429  11/27/17 0830 11/27/17 1725 11/28/17 0630 11/29/17 0707 11/30/17 0732  WBC 12.4* 8.0   < > 8.2 7.4 6.4 9.4 6.8  NEUTROABS 9.5* 6.8  --   --   --   --   --   --   HGB 8.4* 9.0*   < > 9.4* 9.5* 9.2* 9.7* 9.1*  HCT 28.8* 29.6*   < > 31.8* 30.4* 30.5* 32.4* 31.1*  MCV 122.0* 121.8*   < > 118.2* 116.9* 119.1* 117.4* 117.4*  PLT 39* 38*   < > 66* 63* 69* 88* 86*   < > = values in this interval not displayed.   Basic Metabolic Panel: Recent Labs  Lab 11/23/17 1533  11/24/17 0429 11/25/17 0857 11/26/17 0730 11/27/17 0830 11/27/17 1725 11/28/17 0822 11/30/17 0732  NA  --    < > 141 140 140 139 141 140 138  K  --    < > 4.0 4.2 3.9 3.6 4.4 3.9 3.7  CL  --    < > 105 104 103 106 107 104 103  CO2  --    < > 21* 23  21* 23 19* 22 22  GLUCOSE  --    < > 133* 203* 142* 174* 178* 300* 351*  BUN  --    < > 24* 9 15 11 14 12 16   CREATININE  --    < > 6.08* 4.19* 5.30* 4.26* 4.69* 5.37* 5.47*  CALCIUM  --    < > 8.7* 8.6* 8.6* 9.1 8.9 8.9 9.3  MG 2.8*  --  2.7* 2.2  --   --   --   --   --   PHOS 5.2*  --  4.9*  5.0* 4.4 3.8  --  3.6 4.0 3.0   < > = values in this interval not displayed.   GFR: Estimated Creatinine Clearance: 13.8 mL/min (A) (by C-G formula based on SCr of 5.47 mg/dL (H)). Liver Function Tests: Recent Labs  Lab 11/23/17 1534  11/26/17 0619 11/26/17 0730 11/27/17 1725 11/28/17 0822 11/30/17 0732  AST 70*  --  73*  --   --   --  56*  ALT 37  --  30  --   --   --  14*  ALKPHOS 141*  --  122  --   --   --  103  BILITOT 11.6*  --  9.1*  --    --   --  7.9*  PROT 6.8  --  6.6  --   --   --  7.1  ALBUMIN 3.4*   < > 3.5 3.5 3.9 3.6 4.0   < > = values in this interval not displayed.   No results for input(s): LIPASE, AMYLASE in the last 168 hours. No results for input(s): AMMONIA in the last 168 hours. Coagulation Profile: Recent Labs  Lab 11/26/17 0619 11/27/17 0830  INR 1.64 1.64   Cardiac Enzymes: No results for input(s): CKTOTAL, CKMB, CKMBINDEX, TROPONINI in the last 168 hours. BNP (last 3 results) No results for input(s): PROBNP in the last 8760 hours. HbA1C: No results for input(s): HGBA1C in the last 72 hours. CBG: Recent Labs  Lab 11/29/17 1205 11/29/17 1743 11/29/17 2159 11/30/17 0750 11/30/17 1216  GLUCAP 236* 311* 197* 317* 386*   Lipid Profile: No results for input(s): CHOL, HDL, LDLCALC, TRIG, CHOLHDL, LDLDIRECT in the last 72 hours. Thyroid Function Tests: No results for input(s): TSH, T4TOTAL, FREET4, T3FREE, THYROIDAB in the last 72 hours. Anemia Panel: No results for input(s): VITAMINB12, FOLATE, FERRITIN, TIBC, IRON, RETICCTPCT in the last 72 hours. Urine analysis:    Component Value Date/Time   COLORURINE YELLOW 11/11/2017 1205   APPEARANCEUR TURBID (A) 11/11/2017 1205   LABSPEC 1.025 11/11/2017 1205   PHURINE 6.5 11/11/2017 1205   GLUCOSEU NEGATIVE 11/11/2017 1205   HGBUR LARGE (A) 11/11/2017 1205   BILIRUBINUR LARGE (A) 11/11/2017 1205   BILIRUBINUR n 02/28/2011 1247   KETONESUR NEGATIVE 11/11/2017 1205   PROTEINUR >300 (A) 11/11/2017 1205   UROBILINOGEN 1.0 01/17/2015 1305   NITRITE NEGATIVE 11/11/2017 1205   LEUKOCYTESUR MODERATE (A) 11/11/2017 1205   Sepsis Labs: @LABRCNTIP (procalcitonin:4,lacticidven:4)  ) No results found for this or any previous visit (from the past 240 hour(s)).    Studies: No results found.  Scheduled Meds: . chlorhexidine  15 mL Mouth Rinse BID  . Chlorhexidine Gluconate Cloth  6 each Topical Daily  . famotidine  20 mg Oral QHS  . feeding  supplement  1 Container Oral BID BM  . feeding supplement (PRO-STAT SUGAR FREE 64)  30 mL Oral BID  . fentaNYL  12.5 mcg  Transdermal Q72H  . folic acid  1 mg Oral Daily  . insulin aspart  0-15 Units Subcutaneous TID AC & HS  . lactulose  20 g Per Tube TID   Or  . lactulose  300 mL Rectal TID  . lidocaine  1 patch Transdermal Q24H  . mouth rinse  15 mL Mouth Rinse q12n4p  . methocarbamol  500 mg Oral TID  . midodrine  10 mg Oral TID WC  .  morphine injection  2 mg Intravenous Once  . octreotide  200 mcg Subcutaneous BID  . QUEtiapine  25 mg Oral QHS  . rifaximin  550 mg Oral BID  . sodium chloride flush  10-40 mL Intracatheter Q12H  . thiamine  100 mg Oral Daily    Continuous Infusions: . sodium chloride Stopped (11/20/17 0900)     LOS: 23 days     Laverna Peace, MD [facility] Pager [phone]  If 7PM-7AM, please contact night-coverage [email or web address] Password [email or web address] 11/30/2017, 1:56 PM

## 2017-11-30 NOTE — Progress Notes (Addendum)
Physical Therapy Treatment Patient Details Name: [name] MRN: [id number] DOB: Jan 07, 1961 Today's Date: 11/30/2017    History of Present Illness Pt adm with abd swelling and lethargy and found to have acute hepatic encephalopathy. Pt intubated for airway protection  3/17-3/20. Pt also with acute kidney injury. PMH - DM, PTSD, compression fx's and pain/ debilitation, GERD, EtOH abuse with cirrhosis    PT Comments    Patient is progressing well with mobility and tolerated ambulating 23ft with RW and supervision overall for mobility. Pt does demonstrate cognitive deficits however no family present to determine baseline cognition. Recommending HHPT for further skilled PT services to maximize independence and safety with mobility.     Follow Up Recommendations  Home health PT;Supervision/Assistance - 24 hour     Equipment Recommendations  None recommended by PT    Recommendations for Other Services       Precautions / Restrictions Precautions Precautions: Fall    Mobility  Bed Mobility               General bed mobility comments: pt OOB in chair upon arrival  Transfers Overall transfer level: Needs assistance Equipment used: Rolling walker (2 wheeled) Transfers: Sit to/from Stand Sit to Stand: Supervision         General transfer comment: for safety  Ambulation/Gait Ambulation/Gait assistance: Supervision Ambulation Distance (Feet): 200 Feet Assistive device: Rolling walker (2 wheeled) Gait Pattern/deviations: Step-through pattern;Decreased stride length;Trunk flexed Gait velocity: decr   General Gait Details: cues for posture and safe proximity to RW; increased time due to slow cadence and pt being easily distracted in hallway   Stairs            Wheelchair Mobility    Modified Rankin (Stroke Patients Only)       Balance Overall balance assessment: Needs assistance Sitting-balance support: No upper extremity supported;Feet  supported Sitting balance-Leahy Scale: Good     Standing balance support: Single extremity supported;During functional activity Standing balance-Leahy Scale: Fair                              Cognition Arousal/Alertness: Awake/alert Behavior During Therapy: Flat affect Overall Cognitive Status: No family/caregiver present to determine baseline cognitive functioning Area of Impairment: Attention;Memory;Following commands;Safety/judgement;Problem solving                   Current Attention Level: Selective Memory: Decreased short-term memory Following Commands: Follows one step commands with increased time;Follows multi-step commands inconsistently Safety/Judgement: Decreased awareness of safety;Decreased awareness of deficits   Problem Solving: Slow processing;Requires verbal cues;Difficulty sequencing        Exercises      General Comments        Pertinent Vitals/Pain Pain Assessment: Faces Faces Pain Scale: Hurts little more Pain Location: back Pain Descriptors / Indicators: Sore Pain Intervention(s): Monitored during session;Premedicated before session;Repositioned    Home Living                      Prior Function            PT Goals (current goals can now be found in the care plan section) Acute Rehab PT Goals PT Goal Formulation: With patient Time For Goal Achievement: 12/12/17 Potential to Achieve Goals: Good Progress towards PT goals: Progressing toward goals    Frequency    Min 2X/week      PT Plan Discharge plan needs to be updated  Co-evaluation              AM-PAC PT "6 Clicks" Daily Activity  Outcome Measure  Difficulty turning over in bed (including adjusting bedclothes, sheets and blankets)?: A Little Difficulty moving from lying on back to sitting on the side of the bed? : A Little Difficulty sitting down on and standing up from a chair with arms (e.g., wheelchair, bedside commode, etc,.)?:  Unable Help needed moving to and from a bed to chair (including a wheelchair)?: A Little Help needed walking in hospital room?: A Little Help needed climbing 3-5 steps with a railing? : A Little 6 Click Score: 16    End of Session Equipment Utilized During Treatment: Gait belt Activity Tolerance: Patient tolerated treatment well Patient left: with call bell/phone within reach;in chair;with chair alarm set Nurse Communication: Mobility status PT Visit Diagnosis: Unsteadiness on feet (R26.81);Other abnormalities of gait and mobility (R26.89);Muscle weakness (generalized) (M62.81)     Time: [id number] PT Time Calculation (min) (ACUTE ONLY): 28 min  Charges:  $Gait Training: 23-37 mins                    G Codes:       Erline LevineKellyn Wilver Tignor, PTA Pager: [phone]     Carolynne EdouardKellyn R Khali Albanese 11/30/2017, 5:24 PM

## 2017-11-30 NOTE — Progress Notes (Signed)
Results for Faythe GheeHARRISON, Ahmere P (MRN [id number]) as of 11/30/2017 13:53  Ref. Range 11/29/2017 12:05 11/29/2017 17:43 11/29/2017 21:59 11/30/2017 07:50 11/30/2017 12:16  Glucose-Capillary Latest Ref Range: 65 - 99 mg/dL 409236 (H) 811311 (H) 914197 (H) 317 (H) 386 (H)  Noted that blood sugars have been greater than 180 mg/dl. Recommend adding Lantus 12 units daily and continuing Novolog MODERATE correction scale TID & HS if blood sugars continue to be elevated. Patient is not eating very well and does not need Novolog meal coverage at this time.  Smith MinceKendra Arda Keadle RN BSN CDE Diabetes Coordinator Pager: [phone]  8am-5pm

## 2017-11-30 NOTE — Progress Notes (Signed)
Patient ID: [name], male   DOB: 1961-04-30, 57 y.o.   MRN: [id number] [name] Progress Note   Assessment/ Plan:   1. Anuric acute kidney injury-on renal replacement therapy since 11/16/17 (CRRT transitioned to IHD).  Based on the initial workup/assessment by Dr. Flossie Dibble was determined to have ATN rather than HRS for which RRT was started (initially determined as "temporary dialysis" but time limits not set). He is not a candidate for liver transplant due to ongoing alcohol consumption at the time of admission. Currently on midodrine and octreotide-questionable benefit/efficacy of the latter but could justify continuing midodrine for his hypotension to allow for dialysis.  Overall poor prognosis noted at this point with difficulties with delivery of dialysis noted over the past few treatments.  I had a lengthy discussion with the patient and his wife today regarding his overall prognosis and the difficult predicament that we were in with regards to his dependency on dialysis and the lack of renal recovery so far. 2. Alcoholic cirrhosis with encephalopathy-bilirubin levels continue to remain elevated indicating significant hepatic synthetic defect.  Albumin levels artificially elevated by albumin infusions.  Discontinue albumin infusions at this time. 3. Thrombocytopenia-secondary to cirrhosis/hypersplenism from portal hypertension 4. Alcohol abuse- will need rehab and ongoing treatment to prevent withdrawal.  5. Disposition-appreciate input from palliative care service and cautiously planning for chronic hemodialysis.  Subjective:   Reports to have been feeling fair and still having trouble tolerating dialysis.   Objective:   BP (!) 97/56 (BP Location: Left Arm)   Pulse (!) 58   Temp 98.3 F (36.8 C) (Oral)   Resp 18   Ht 5\' 6"  (1.676 m)   Wt 73.1 kg (161 lb 2.5 oz)   SpO2 94%   BMI 26.01 kg/m   Intake/Output Summary (Last 24 hours) at 11/30/2017 1148 Last data  filed at 11/30/2017 1051 Gross per 24 hour  Intake 480 ml  Output -  Net 480 ml   Weight change: -1.5 kg (-3 lb 4.9 oz)  Physical Exam: Gen: Comfortably resting in recliner, watching television, icteric CVS: Pulse regular rhythm, ejection systolic murmur over apex Resp: Decreased breath sounds over bases, no rales Abd: Soft, distended, mildly tender over the lower quadrants Ext: 1+ bilateral lower extremity edema  Imaging: No results found.  Labs: BMET Recent Labs  Lab 11/23/17 1533  11/24/17 0429 11/25/17 0857 11/26/17 0730 11/27/17 0830 11/27/17 1725 11/28/17 0822 11/30/17 0732  NA  --    < > 141 140 140 139 141 140 138  K  --    < > 4.0 4.2 3.9 3.6 4.4 3.9 3.7  CL  --    < > 105 104 103 106 107 104 103  CO2  --    < > 21* 23 21* 23 19* 22 22  GLUCOSE  --    < > 133* 203* 142* 174* 178* 300* 351*  BUN  --    < > 24* 9 15 11 14 12 16   CREATININE  --    < > 6.08* 4.19* 5.30* 4.26* 4.69* 5.37* 5.47*  CALCIUM  --    < > 8.7* 8.6* 8.6* 9.1 8.9 8.9 9.3  PHOS 5.2*  --  4.9*  5.0* 4.4 3.8  --  3.6 4.0 3.0   < > = values in this interval not displayed.   CBC Recent Labs  Lab 11/23/17 1533 11/24/17 0429  11/27/17 1725 11/28/17 0630 11/29/17 0707 11/30/17 0732  WBC 12.4* 8.0   < >  7.4 6.4 9.4 6.8  NEUTROABS 9.5* 6.8  --   --   --   --   --   HGB 8.4* 9.0*   < > 9.5* 9.2* 9.7* 9.1*  HCT 28.8* 29.6*   < > 30.4* 30.5* 32.4* 31.1*  MCV 122.0* 121.8*   < > 116.9* 119.1* 117.4* 117.4*  PLT 39* 38*   < > 63* 69* 88* 86*   < > = values in this interval not displayed.    Medications:    . chlorhexidine  15 mL Mouth Rinse BID  . Chlorhexidine Gluconate Cloth  6 each Topical Daily  . famotidine  20 mg Oral QHS  . feeding supplement  1 Container Oral BID BM  . feeding supplement (PRO-STAT SUGAR FREE 64)  30 mL Oral BID  . fentaNYL  12.5 mcg Transdermal Q72H  . folic acid  1 mg Oral Daily  . insulin aspart  0-15 Units Subcutaneous TID AC & HS  . lactulose  20 g Per Tube  TID   Or  . lactulose  300 mL Rectal TID  . lidocaine  1 patch Transdermal Q24H  . mouth rinse  15 mL Mouth Rinse q12n4p  . methocarbamol  500 mg Oral TID  . midodrine  10 mg Oral TID WC  .  morphine injection  2 mg Intravenous Once  . octreotide  200 mcg Subcutaneous BID  . QUEtiapine  25 mg Oral QHS  . rifaximin  550 mg Oral BID  . sodium chloride flush  10-40 mL Intracatheter Q12H  . thiamine  100 mg Oral Daily   Zetta BillsJay Yahayra Geis, MD 11/30/2017, 11:48 AM

## 2017-12-01 LAB — RENAL FUNCTION PANEL
ANION GAP: 14 (ref 5–15)
Albumin: 4.1 g/dL (ref 3.5–5.0)
BUN: 23 mg/dL — AB (ref 6–20)
CALCIUM: 9.5 mg/dL (ref 8.9–10.3)
CO2: 22 mmol/L (ref 22–32)
CREATININE: 5.82 mg/dL — AB (ref 0.61–1.24)
Chloride: 103 mmol/L (ref 101–111)
GFR calc Af Amer: 11 mL/min — ABNORMAL LOW (ref >60)
GFR calc non Af Amer: 10 mL/min — ABNORMAL LOW (ref >60)
GLUCOSE: 336 mg/dL — AB (ref 65–99)
PHOSPHORUS: 2.8 mg/dL (ref 2.5–4.6)
Potassium: 4 mmol/L (ref 3.5–5.1)
SODIUM: 139 mmol/L (ref 135–145)

## 2017-12-01 LAB — CBC
HCT: 30.7 % — ABNORMAL LOW (ref 39.0–52.0)
HEMOGLOBIN: 9.5 g/dL — AB (ref 13.0–17.0)
MCH: 36 pg — ABNORMAL HIGH (ref 26.0–34.0)
MCHC: 30.9 g/dL (ref 30.0–36.0)
MCV: 116.3 fL — ABNORMAL HIGH (ref 78.0–100.0)
Platelets: 87 10*3/uL — ABNORMAL LOW (ref 150–400)
RBC: 2.64 MIL/uL — ABNORMAL LOW (ref 4.22–5.81)
RDW: 15.1 % (ref 11.5–15.5)
WBC: 6.2 10*3/uL (ref 4.0–10.5)

## 2017-12-01 LAB — GLUCOSE, CAPILLARY
Glucose-Capillary: 287 mg/dL — ABNORMAL HIGH (ref 65–99)
Glucose-Capillary: 425 mg/dL — ABNORMAL HIGH (ref 65–99)
Glucose-Capillary: 298 mg/dL — ABNORMAL HIGH (ref 65–99)

## 2017-12-01 LAB — GLUCOSE, RANDOM: Glucose, Bld: 409 mg/dL — ABNORMAL HIGH (ref 65–99)

## 2017-12-01 MED ORDER — ENSURE ENLIVE PO LIQD
237.0000 mL | Freq: Three times a day (TID) | ORAL | Status: DC
  Administered 2017-12-02 – 2017-12-07 (×8): 237 mL via ORAL

## 2017-12-01 MED ORDER — PRO-STAT SUGAR FREE PO LIQD
30.0000 mL | Freq: Three times a day (TID) | ORAL | Status: DC
  Administered 2017-12-01 – 2017-12-07 (×16): 30 mL via ORAL
  Filled 2017-12-01 (×16): qty 30

## 2017-12-01 MED ORDER — MIDODRINE HCL 5 MG PO TABS
ORAL_TABLET | ORAL | Status: AC
  Filled 2017-12-01: qty 2

## 2017-12-01 MED ORDER — HEPARIN SODIUM (PORCINE) 1000 UNIT/ML DIALYSIS: 40.0000 [IU]/kg | INTRAMUSCULAR | Status: DC | PRN

## 2017-12-01 MED ORDER — INSULIN GLARGINE 100 UNIT/ML ~~LOC~~ SOLN
10.0000 [IU] | Freq: Every day | SUBCUTANEOUS | Status: DC
  Administered 2017-12-01: 10 [IU] via SUBCUTANEOUS
  Filled 2017-12-01 (×2): qty 0.1

## 2017-12-01 NOTE — Progress Notes (Signed)
Patient ID: [name], male   DOB: 06/23/1961, 57 y.o.   MRN: [id number] [facility] Progress Note   Assessment/ Plan:   1. Anuric acute kidney injury-on renal replacement therapy since 11/16/17 (CRRT transitioned to IHD).  Based on the initial workup/assessment by Dr. Flossie DibbleWebb-he was determined to have ATN rather than HRS for which RRT was started (initially determined as "temporary dialysis" but time limits not set). He is not a candidate for liver transplant due to ongoing alcohol consumption at the time of admission. Albumin discontinued and remains on midodrine and octreotide (unclear if needs to remain on the latter). Will start process for OP HD unit placement with much reservation and consult [facility] for HD access planning. Continue TTS HD while in hospital.  2. Alcoholic cirrhosis with encephalopathy-bilirubin levels continue to remain elevated indicating significant hepatic synthetic defect.  Albumin levels artificially elevated by albumin infusions.   3. Thrombocytopenia-secondary to cirrhosis/hypersplenism from portal hypertension 4. Alcohol abuse- will need rehab and ongoing treatment to prevent withdrawal.  5. Disposition-appreciate input from palliative care service and cautiously planning for chronic hemodialysis.  Subjective:   With some abdominal and back pain overnight.   Objective:   BP (!) 87/54   Pulse 76   Temp 97.6 F (36.4 C) (Oral)   Resp 15   Ht 5\' 6"  (1.676 m)   Wt 72.5 kg (159 lb 13.3 oz)   SpO2 97%   BMI 25.80 kg/m   Intake/Output Summary (Last 24 hours) at 12/01/2017 0914 Last data filed at 12/01/2017 0543 Gross per 24 hour  Intake 700 ml  Output 500 ml  Net 200 ml   Weight change: -0.9 kg (-1 lb 15.8 oz)  Physical Exam: Gen: Comfortably resting in hemodialysis, icteric CVS: Pulse regular rhythm, ejection systolic murmur over apex Resp: Decreased breath sounds over bases, no rales Abd: Soft, distended, mildly tender over the lower  quadrants Ext: 1+ bilateral lower extremity edema  Imaging: No results found.  Labs: BMET Recent Labs  Lab 11/25/17 0857 11/26/17 0730 11/27/17 0830 11/27/17 1725 11/28/17 0822 11/30/17 0732 12/01/17 0617  NA 140 140 139 141 140 138 139  K 4.2 3.9 3.6 4.4 3.9 3.7 4.0  CL 104 103 106 107 104 103 103  CO2 23 21* 23 19* 22 22 22   GLUCOSE 203* 142* 174* 178* 300* 351* 336*  BUN 9 15 11 14 12 16  23*  CREATININE 4.19* 5.30* 4.26* 4.69* 5.37* 5.47* 5.82*  CALCIUM 8.6* 8.6* 9.1 8.9 8.9 9.3 9.5  PHOS 4.4 3.8  --  3.6 4.0 3.0 2.8   CBC Recent Labs  Lab 11/28/17 0630 11/29/17 0707 11/30/17 0732 12/01/17 0617  WBC 6.4 9.4 6.8 6.2  HGB 9.2* 9.7* 9.1* 9.5*  HCT 30.5* 32.4* 31.1* 30.7*  MCV 119.1* 117.4* 117.4* 116.3*  PLT 69* 88* 86* 87*    Medications:    . chlorhexidine  15 mL Mouth Rinse BID  . Chlorhexidine Gluconate Cloth  6 each Topical Daily  . famotidine  20 mg Oral QHS  . feeding supplement  1 Container Oral BID BM  . feeding supplement (PRO-STAT SUGAR FREE 64)  30 mL Oral BID  . fentaNYL  12.5 mcg Transdermal Q72H  . folic acid  1 mg Oral Daily  . insulin aspart  0-15 Units Subcutaneous TID AC & HS  . insulin glargine  10 Units Subcutaneous QHS  . lactulose  20 g Per Tube TID   Or  . lactulose  300 mL  Rectal TID  . lidocaine  1 patch Transdermal Q24H  . mouth rinse  15 mL Mouth Rinse q12n4p  . methocarbamol  500 mg Oral TID  . midodrine      . midodrine  10 mg Oral TID WC  . octreotide  200 mcg Subcutaneous BID  . QUEtiapine  25 mg Oral QHS  . rifaximin  550 mg Oral BID  . sodium chloride flush  10-40 mL Intracatheter Q12H  . thiamine  100 mg Oral Daily   Zetta Bills, MD 12/01/2017, 9:14 AM

## 2017-12-01 NOTE — Social Work (Signed)
CSW aware that pt is still having intermittant dialysis. Will need updated PT/OT clinicals if pt still wants to discharge to SNF (was not fully amenable when CSW spoke with pt on previous floor).  CSW continuing to follow for any additional supports.  Doy HutchingIsabel H Makensey [name], LCSWA [facility] Clinical Social Work [phone]

## 2017-12-01 NOTE — Progress Notes (Addendum)
Palliative:  [name] is sitting in chair when I come to visit. He is again exhausted and almost falling asleep during my visit (even though RN was attempting to replace IV). Last week HD completely wiped him out the rest of the day as well. Appears to be tolerating HD so far. He is almost delirious after HD and I would worry about his safety if HD is continued at d/c and he returns home.   I asked about pain control and appears to be controlled. Not fully able to engage in conversation with me today. Do not plan to increase Fentanyl patch as I see that he is not planned to d/c on Fentanyl patch and he does have bad chronic pain. If there are plans for Fentanyl patch to be continued at discharge and if we have a provider willing to prescribe then we can reconsider titration.    Will follow up with [name] CustardAaron tomorrow when he has had adequate rest post HD and able to speak further with me. Goals are unchanged.   15 min  Yong ChannelAlicia Regan Mcbryar, NP Palliative Medicine Team Pager # [phone] (M-F 8a-5p) Team Phone # [phone] (Nights/Weekends)

## 2017-12-01 NOTE — Progress Notes (Signed)
Nutrition Follow-up  DOCUMENTATION CODES:   Not applicable  INTERVENTION:   -D/c Boost Breeze po BID, each supplement provides 250 kcal and 9 grams of protein -Increase 30 ml Prostat to TID, each supplement provides 100 kcals and 15 grams protein -Ensure Enlive po TID, each supplement provides 350 kcal and 20 grams of protein  NUTRITION DIAGNOSIS:   Inadequate oral intake related to lethargy/confusion as evidenced by meal completion < 50%.  Ongoing  GOAL:   Patient will meet greater than or equal to 90% of their needs  Progressing  MONITOR:   PO intake, Supplement acceptance, Labs, I & O's  REASON FOR ASSESSMENT:   Ventilator, Consult Enteral/tube feeding initiation and management  ASSESSMENT:   57 yo male with PMH of GERD, osteoporosis, vitamin D deficiency, hepatomegaly, alcohol abuse, pancreatitis, DM, compression fracture, Legionnaire's DZ, PTSD who was admitted on 3/16 with decompensated cirrhosis with hepatic encephalopathy, hypoglycemia. Required intubation on 3/17.  Reviewed I/O's: +200 ml x 24 hours and -13.2 L since 11/17/17  Wt hx reviewed. Pt has experienced a 24# (13.4%) wt loss x 1 week.   Pt receiving nursing care at time of visit. Intake continues to be poor (PO: 0-25%). Pt refusing Boost Breeze, however, accepting Prostat supplements.  Palliative care following; unsure if pt will be able to tolerate long term HD. Case discussed with RNCM, who reports potential plan to d/c to SNF.   Labs reviewed: CBGS: 185-386 (inpatient orders for glycemic control are0-15 units insulin aspart TID with meals and q HS, 10 units insulin glargine daily).   Diet Order:  Diet Carb Modified Fluid consistency: Thin; Room service appropriate? Yes  EDUCATION NEEDS:   No education needs have been identified at this time  Skin:  Skin Assessment: Skin Integrity Issues: Skin Integrity Issues:: Other (Comment) Other: venous stasis/non pressure injury ulcers bilateral  extremities  Last BM:  12/01/17  Height:   Ht Readings from Last 1 Encounters:  11/22/17 5\' 6"  (1.676 m)    Weight:   Wt Readings from Last 1 Encounters:  12/01/17 155 lb 6.8 oz (70.5 kg)    Ideal Body Weight:  70 kg  BMI:  Body mass index is 25.09 kg/m.  Estimated Nutritional Needs:   Kcal:  2100-2300  Protein:  115-130 gm  Fluid:  1.2 L    Manpreet Kemmer A. Mayford KnifeWilliams, RD, LDN, CDE Pager: [phone] After hours Pager: [phone]

## 2017-12-01 NOTE — Procedures (Signed)
Patient seen on Hemodialysis. QB 400, UF goal 2L Treatment adjusted as needed.  Zetta BillsJay Rihana Kiddy MD [facility]. Office # [phone] Pager # [phone] 9:20 AM

## 2017-12-01 NOTE — Progress Notes (Signed)
PROGRESS NOTE  [name]:[id number] DOB: 13-Jun-1961 DOA: 11/07/2017 PCP: Dettinger, Elige Radon, MD  HPI/Recap of past 24 hours:  [name] is a 57 y.o. year old male with medical history significant for alcoholic cirrhosis, T2DM, PTSD, and chronic back pain who presented on 11/07/2017 with 2 weeks of abdominal swelling, worsening jaundice and somnolence and was found to have decompensated cirrhosis prompting admission to critical care for hepatic encephalopathy,hypoglycemia related to continued insulin therapy despite decreased PO intake, presumed SBP ( no paracentesis with insufficient ascites), INR coagulopathy and and eventually intubated (3/16) for inability to protect airway.   Prolonged hospital course included intubation on 3/16 (extubated 3/20) due to hypoxia and somnolence, empiric SBP therapy ( not enough asites to tap)recurrent hypoglycemia requiring IV dextrose, severe cellulitis of lower extremities treated with vancomycin during time in critical care, anuric AKI/ATN prompted discontinuation of Vancomyin( 3/20), empiric IV albumin/midodrine/octreotide treatment (3/23-4/8), CRRT and eventually transition to intermittent HD( 4/2). He was transferred to [facility] service on 11/23/17 for further management and has been undergoing intermittent HD. Palliative is on board as patient has poor prognosis and unclear if he can tolerate HD long term.   Subjective Back pain present during HD session this am. Denies any chest pain, shortness of breath, cough.    Assessment/Plan: Active Problems:   Hypoxia   Acute respiratory failure with hypoxia (HCC)   Cirrhosis (HCC)   Acute renal failure (ARF) (HCC)   Goals of care, counseling/discussion   Palliative care by specialist   ESRD (end stage renal disease) (HCC)   Thrombocytopenia (HCC)   Alcohol abuse   Pain, generalized  #Anuric Acute renal failure - Presumed hemodynamically mediated/ ischemic ATN ( hypotension from SBP, ?some  element of vancomycin toxicity which he was on for severe cellulitis for his lower extremities, some prior concern for HRS and had trial of albumin, midodrine, octreotride 3/23-4/8) - CRRT 3/26-3/29, s/p tunneled cath placement on 4/5, -15 L this admission - His BP has been tolerating intermittent HD this past week - midodrine (less for HRS and more helpful in BP on iHD), octreotide, d/c'd albumin ( by nephro) - poor prognosis given unclear patient can tolerate long term HD with his BP, palliative medicine following - 12.5 mcg fentanyl patch to assist w/ back pain most prominent after HD sessions ( started on 4/7 after discussing with Glenna Fellows (palliative care), and Dr.Coladonato (nephrology) disease. We do NOT plan on discharging patient home on a fentanyl patch   #Decompensated Alcoholic cirrhosis with hepatic encephalopathy, stable -Normal mentation and continues to remain w/o asterixis.  -Continue lactulose ( 20-g TID) and rifaximin regimen.    #Hyperbilirubinemia and Elevated AST, improving -Likely related to cirrhosis given hepatitis panel on admission negative and both are downtrending steadily.   #Thrombocytopenia- improving  -secondary to cirrhosis/hypersplenism - Asymptomatic, continue to monitor on CBC  #Anemia, likely of chronic disease given ESRD and chronic liver disease - hgb stable at 9.5 - iron and TIBC  #Type 2 DM- A1C 4.6 (11/2017), - on admission presented with hypoglycemia requiring IV dextrose as patient was still taking insulin despite no PO intake with hepatic encephalopathy - A1c 4%, likely hypersplenism? (portal hypertension associated with cirrhosis), no blood transfusions here, additionally 50 lb weight loss in 2 months maybe  - Elevated fasting glucose > 300 for several days, will start lantus 10 U tonight - less concern for hypoglycemia given patient is eating well - sliding scale, CBG monitoring   #Alcohol Abuse -  Discussed importance of cessation  with family and patient.  -Folic and thiamine supplementation. Continue Seroquel  #Acute hypoxic respiratory failure, resolved -S/p 2 intubations during hospitalization likely related to inability to protect airway in setting of decompensated cirrhosis/hepatic encephalopathy and metabolic derangements from worsening renal failure earlier in admission. -Continue to encourage incentive spirometry  #Acute on Chronic back pain - flare worsened by recent HD session, continue oxycodone PRN q4h severe pain, and robaxin.   - 12.5 mcg fentanyl patch added this admission, will closely monitor to ensure does not negatively affect blood pressure.  Code Status: Partial Code ( NO CPR or intubation, pressors allowed)   Family Communication: No family at bedside  Disposition Plan: Continue to monitor patient's ability to tolerate dialysis tentatively expect chronic HD.  Procedures:   CRRT (3/25-3/29)  HD 4/2--  Tunneled cath placement 4/5  Intubated 3/16, Extubated 3/20,    Consultants:  Pulmonology  Nephrology  Gastroenterology  Palliative Care  Interventional Radiology  Antimicrobials: Ceftriaxone 3/16 >>3.22 Vancomycin 3/17 >>3/18 Zosyn 3/24 >>3/30   Telemetry: Yes  DVT prophylaxis:  Heparin   Objective: Vitals:   12/01/17 1100 12/01/17 1114 12/01/17 1145 12/01/17 1416  BP: (!) 89/55 104/65 115/67 94/62  Pulse: 66 66 72 79  Resp:  14 12 20   Temp:  98.3 F (36.8 C) 98.4 F (36.9 C) 98.8 F (37.1 C)  TempSrc:  Oral Oral Oral  SpO2:  97% 96% 92%  Weight:  70.5 kg (155 lb 6.8 oz)    Height:        Intake/Output Summary (Last 24 hours) at 12/01/2017 1520 Last data filed at 12/01/2017 1114 Gross per 24 hour  Intake 360 ml  Output 2500 ml  Net -2140 ml   Filed Weights   12/01/17 0500 12/01/17 0706 12/01/17 1114  Weight: 72.2 kg (159 lb 2.8 oz) 72.5 kg (159 lb 13.3 oz) 70.5 kg (155 lb 6.8 oz)    Exam:  Constitutional:chronically ill appearing male, lying in  bed in no apparent distress Eyes: EOMI, scleral icterus, normal conjunctivae Cardiovascular: RRR no MRGs, with 1+ pitting edema Respiratory: Normal respiratory effort on room air, crackles from bases to midlung  Abdomen:Distended, soft, non-tender, with no appreciable HSM Skin: skin breakdown with dressing on lower leg. Without skin tenting , Jaundiced Neurologic: Grossly no focal neuro deficit. No asterixis Psychiatric:Appropriate affect, and mood. Mental status AAOx3  Data Reviewed: CBC: Recent Labs  Lab 11/27/17 1725 11/28/17 0630 11/29/17 0707 11/30/17 0732 12/01/17 0617  WBC 7.4 6.4 9.4 6.8 6.2  HGB 9.5* 9.2* 9.7* 9.1* 9.5*  HCT 30.4* 30.5* 32.4* 31.1* 30.7*  MCV 116.9* 119.1* 117.4* 117.4* 116.3*  PLT 63* 69* 88* 86* 87*   Basic Metabolic Panel: Recent Labs  Lab 11/25/17 0857 11/26/17 0730 11/27/17 0830 11/27/17 1725 11/28/17 0822 11/30/17 0732 12/01/17 0617  NA 140 140 139 141 140 138 139  K 4.2 3.9 3.6 4.4 3.9 3.7 4.0  CL 104 103 106 107 104 103 103  CO2 23 21* 23 19* 22 22 22   GLUCOSE 203* 142* 174* 178* 300* 351* 336*  BUN 9 15 11 14 12 16  23*  CREATININE 4.19* 5.30* 4.26* 4.69* 5.37* 5.47* 5.82*  CALCIUM 8.6* 8.6* 9.1 8.9 8.9 9.3 9.5  MG 2.2  --   --   --   --   --   --   PHOS 4.4 3.8  --  3.6 4.0 3.0 2.8   GFR: Estimated Creatinine Clearance: 12.9 mL/min (A) (by C-G formula  based on SCr of 5.82 mg/dL (H)). Liver Function Tests: Recent Labs  Lab 11/26/17 0619 11/26/17 0730 11/27/17 1725 11/28/17 0822 11/30/17 0732 12/01/17 0617  AST 73*  --   --   --  56*  --   ALT 30  --   --   --  14*  --   ALKPHOS 122  --   --   --  103  --   BILITOT 9.1*  --   --   --  7.9*  --   PROT 6.6  --   --   --  7.1  --   ALBUMIN 3.5 3.5 3.9 3.6 4.0 4.1   No results for input(s): LIPASE, AMYLASE in the last 168 hours. No results for input(s): AMMONIA in the last 168 hours. Coagulation Profile: Recent Labs  Lab 11/26/17 0619 11/27/17 0830  INR 1.64 1.64    Cardiac Enzymes: No results for input(s): CKTOTAL, CKMB, CKMBINDEX, TROPONINI in the last 168 hours. BNP (last 3 results) No results for input(s): PROBNP in the last 8760 hours. HbA1C: Recent Labs    11/30/17 0732  HGBA1C 4.4*   CBG: Recent Labs  Lab 11/29/17 2159 11/30/17 0750 11/30/17 1216 11/30/17 1715 12/01/17 1207  GLUCAP 197* 317* 386* 185* 287*   Lipid Profile: No results for input(s): CHOL, HDL, LDLCALC, TRIG, CHOLHDL, LDLDIRECT in the last 72 hours. Thyroid Function Tests: No results for input(s): TSH, T4TOTAL, FREET4, T3FREE, THYROIDAB in the last 72 hours. Anemia Panel: No results for input(s): VITAMINB12, FOLATE, FERRITIN, TIBC, IRON, RETICCTPCT in the last 72 hours. Urine analysis:    Component Value Date/Time   COLORURINE YELLOW 11/11/2017 1205   APPEARANCEUR TURBID (A) 11/11/2017 1205   LABSPEC 1.025 11/11/2017 1205   PHURINE 6.5 11/11/2017 1205   GLUCOSEU NEGATIVE 11/11/2017 1205   HGBUR LARGE (A) 11/11/2017 1205   BILIRUBINUR LARGE (A) 11/11/2017 1205   BILIRUBINUR n 02/28/2011 1247   KETONESUR NEGATIVE 11/11/2017 1205   PROTEINUR >300 (A) 11/11/2017 1205   UROBILINOGEN 1.0 01/17/2015 1305   NITRITE NEGATIVE 11/11/2017 1205   LEUKOCYTESUR MODERATE (A) 11/11/2017 1205   Sepsis Labs: @LABRCNTIP (procalcitonin:4,lacticidven:4)  ) No results found for this or any previous visit (from the past 240 hour(s)).    Studies: No results found.  Scheduled Meds: . chlorhexidine  15 mL Mouth Rinse BID  . Chlorhexidine Gluconate Cloth  6 each Topical Daily  . famotidine  20 mg Oral QHS  . feeding supplement  1 Container Oral BID BM  . feeding supplement (PRO-STAT SUGAR FREE 64)  30 mL Oral BID  . fentaNYL  12.5 mcg Transdermal Q72H  . folic acid  1 mg Oral Daily  . insulin aspart  0-15 Units Subcutaneous TID AC & HS  . insulin glargine  10 Units Subcutaneous QHS  . lactulose  20 g Per Tube TID   Or  . lactulose  300 mL Rectal TID  . lidocaine  1  patch Transdermal Q24H  . mouth rinse  15 mL Mouth Rinse q12n4p  . methocarbamol  500 mg Oral TID  . midodrine  10 mg Oral TID WC  . octreotide  200 mcg Subcutaneous BID  . QUEtiapine  25 mg Oral QHS  . rifaximin  550 mg Oral BID  . sodium chloride flush  10-40 mL Intracatheter Q12H  . thiamine  100 mg Oral Daily    Continuous Infusions: . sodium chloride Stopped (11/20/17 0900)     LOS: 24 days  Laverna Peace, MD [facility] Pager [phone]  If 7PM-7AM, please contact night-coverage [email or web address] Password [email or web address] 12/01/2017, 3:20 PM

## 2017-12-01 NOTE — Progress Notes (Signed)
Pt's CBG 425.  MD notified.  Per MD to await for lab verification and give 15 units of insulin.

## 2017-12-02 ENCOUNTER — Inpatient Hospital Stay (HOSPITAL_COMMUNITY): Payer: Medicare HMO

## 2017-12-02 DIAGNOSIS — E119 Type 2 diabetes mellitus without complications: Secondary | ICD-10-CM

## 2017-12-02 DIAGNOSIS — Z992 Dependence on renal dialysis: Secondary | ICD-10-CM

## 2017-12-02 DIAGNOSIS — N186 End stage renal disease: Secondary | ICD-10-CM

## 2017-12-02 LAB — CBC
HEMATOCRIT: 33.7 % — AB (ref 39.0–52.0)
Hemoglobin: 10.4 g/dL — ABNORMAL LOW (ref 13.0–17.0)
MCH: 35.5 pg — ABNORMAL HIGH (ref 26.0–34.0)
MCHC: 30.9 g/dL (ref 30.0–36.0)
MCV: 115 fL — AB (ref 78.0–100.0)
PLATELETS: 93 10*3/uL — AB (ref 150–400)
RBC: 2.93 MIL/uL — AB (ref 4.22–5.81)
RDW: 15 % (ref 11.5–15.5)
WBC: 8.3 10*3/uL (ref 4.0–10.5)

## 2017-12-02 LAB — COMPREHENSIVE METABOLIC PANEL
ALT: 13 U/L — ABNORMAL LOW (ref 17–63)
AST: 74 U/L — ABNORMAL HIGH (ref 15–41)
Albumin: 4.1 g/dL (ref 3.5–5.0)
Alkaline Phosphatase: 135 U/L — ABNORMAL HIGH (ref 38–126)
Anion gap: 16 — ABNORMAL HIGH (ref 5–15)
BILIRUBIN TOTAL: 8.1 mg/dL — AB (ref 0.3–1.2)
BUN: 17 mg/dL (ref 6–20)
CHLORIDE: 98 mmol/L — AB (ref 101–111)
CO2: 24 mmol/L (ref 22–32)
Calcium: 9.2 mg/dL (ref 8.9–10.3)
Creatinine, Ser: 4.49 mg/dL — ABNORMAL HIGH (ref 0.61–1.24)
GFR calc Af Amer: 16 mL/min — ABNORMAL LOW (ref >60)
GFR, EST NON AFRICAN AMERICAN: 13 mL/min — AB (ref >60)
Glucose, Bld: 184 mg/dL — ABNORMAL HIGH (ref 65–99)
POTASSIUM: 3.4 mmol/L — AB (ref 3.5–5.1)
Sodium: 138 mmol/L (ref 135–145)
TOTAL PROTEIN: 7.4 g/dL (ref 6.5–8.1)

## 2017-12-02 LAB — GLUCOSE, CAPILLARY
GLUCOSE-CAPILLARY: 143 mg/dL — AB (ref 65–99)
GLUCOSE-CAPILLARY: 223 mg/dL — AB (ref 65–99)
GLUCOSE-CAPILLARY: 231 mg/dL — AB (ref 65–99)
Glucose-Capillary: 440 mg/dL — ABNORMAL HIGH (ref 65–99)
Glucose-Capillary: 254 mg/dL — ABNORMAL HIGH (ref 65–99)
Glucose-Capillary: 148 mg/dL — ABNORMAL HIGH (ref 65–99)

## 2017-12-02 MED ORDER — INSULIN ASPART 100 UNIT/ML ~~LOC~~ SOLN
5.0000 [IU] | Freq: Once | SUBCUTANEOUS | Status: AC
  Administered 2017-12-02: 5 [IU] via SUBCUTANEOUS

## 2017-12-02 MED ORDER — INSULIN ASPART 100 UNIT/ML ~~LOC~~ SOLN
0.0000 [IU] | Freq: Three times a day (TID) | SUBCUTANEOUS | Status: DC
  Administered 2017-12-02: 7 [IU] via SUBCUTANEOUS
  Administered 2017-12-03: 4 [IU] via SUBCUTANEOUS
  Administered 2017-12-03: 11 [IU] via SUBCUTANEOUS
  Administered 2017-12-04 (×2): 7 [IU] via SUBCUTANEOUS
  Administered 2017-12-04: 20 [IU] via SUBCUTANEOUS
  Administered 2017-12-05: 11 [IU] via SUBCUTANEOUS
  Administered 2017-12-05: 3 [IU] via SUBCUTANEOUS
  Administered 2017-12-06: 20 [IU] via SUBCUTANEOUS
  Administered 2017-12-06: 15 [IU] via SUBCUTANEOUS
  Administered 2017-12-06: 3 [IU] via SUBCUTANEOUS
  Administered 2017-12-07: 4 [IU] via SUBCUTANEOUS
  Administered 2017-12-07: 10 [IU] via SUBCUTANEOUS
  Administered 2017-12-07: 20 [IU] via SUBCUTANEOUS
  Administered 2017-12-08: 15 [IU] via SUBCUTANEOUS
  Administered 2017-12-09: 11 [IU] via SUBCUTANEOUS
  Administered 2017-12-09: 7 [IU] via SUBCUTANEOUS
  Administered 2017-12-09: 3 [IU] via SUBCUTANEOUS
  Administered 2017-12-10: 11 [IU] via SUBCUTANEOUS
  Administered 2017-12-10: 7 [IU] via SUBCUTANEOUS
  Administered 2017-12-11: 3 [IU] via SUBCUTANEOUS
  Administered 2017-12-11: 4 [IU] via SUBCUTANEOUS
  Administered 2017-12-11: 3 [IU] via SUBCUTANEOUS
  Administered 2017-12-12: 20 [IU] via SUBCUTANEOUS
  Administered 2017-12-13: 11 [IU] via SUBCUTANEOUS
  Administered 2017-12-13: 3 [IU] via SUBCUTANEOUS

## 2017-12-02 MED ORDER — INSULIN GLARGINE 100 UNIT/ML ~~LOC~~ SOLN
16.0000 [IU] | Freq: Every day | SUBCUTANEOUS | Status: DC
  Administered 2017-12-02 – 2017-12-03 (×2): 16 [IU] via SUBCUTANEOUS
  Filled 2017-12-02 (×2): qty 0.16

## 2017-12-02 NOTE — Progress Notes (Signed)
Upper Extremity Vein Map  Right Cephalic: Not visualized  Right Basilic  Segment Diameter Depth Comment  1. Axilla   Not visualized  2. Mid upper arm 2.4643mm 19.59mm   3. [facility] 1.5595mm 17.7410mm   4. In [facility] 2.3319mm 11.5850mm Thrombosed  5. Below AC 1.7383mm 18.245mm Thrombosed  6. Mid forearm 1.6492mm 16.5910mm Thrombosed  7. Wrist   Not visualized                  Left Cephalic  Segment Diameter Depth Comment  1. Axilla 1.7228mm 10.705mm   2. Mid upper arm 0.4156mm 3.5519mm   3. Above [facility]   Not visualized  4. In [facility]   Not visualized  5. [facility]   Not visualized  6. Mid forearm   Not visualized  7. Wrist   Not visualized                  Left Basilic  Segment Diameter Depth Comment  1. Axilla 2.6655mm 16.357mm Thrombosed  2. Mid upper arm 2.5974mm 12.2560mm Thrombosed  3. Above [facility] 2.6049mm 12.8320mm Thrombosed  4. In AC mm mm Not visualized  5. Below AC mm mm Not visualized  6. Mid forearm mm mm Not visualized  7. Wrist mm mm Not visualized   mm mm    mm mm    mm mm    Preliminary result notified RN Marissa by phone.  12/02/2017  1:32 PM HONGYING  Domenick Quebedeaux (RDMS RVT) Gertie FeyMichelle Simonetti (RDMS RVT RDCS)

## 2017-12-02 NOTE — Progress Notes (Signed)
Palliative:  I met today with Nijel and wife, [name]. I have had many conversations with Marjory Lies but few with [name]. We were joined by Dr. Posey Pronto who discussed his concerns with dialysis and Samul's tolerance and QOL at length. [name] feels very strongly that he needs to continue dialysis and "I'm not ready to lose you." Dr. Posey Pronto also discussed expectations with stopping dialysis including a peaceful death.   I continued to discuss briefly after Dr. Posey Pronto and Marjory Lies seems inclined to continue dialysis (this may be for his wife). He was more thoughtful about considering NOT pursing dialysis than he has been in our past discussions. He recognizes that this is going to make a huge impact in his QOL. We also discussed that there will likely come a time when we cannot continue dialysis as his body will not physically/medically tolerate this - explained that this is a huge concern with his liver failure. I reiterated to [place] that he needs to communicate his limits and when we are not helping him with dialysis and he has no QOL. [place] is very aware of his options. He does not want to "give up."   I also expressed my concern with him being at home alone after dialysis and that I would fear for his safety given his level of lethargy and almost delirium post HD. Stressed to [place] that if he continues HD outpatient I would recommend someone to be with home after dialysis sessions.   All questions/concerns addressed. Emotional support provided.   Marjory Lies alert, completely oriented. Better spirits than yesterday. Pain controlled. No distress.   28 min  Vinie Sill, NP Palliative Medicine Team Pager # [phone] (M-F 8a-5p) Team Phone # [phone] (Nights/Weekends)

## 2017-12-02 NOTE — Progress Notes (Signed)
PROGRESS NOTE    [name]  RUE:[id number] DOB: 1961-06-21 DOA: 11/07/2017 PCP: Dettinger, Elige Radon, MD  Brief Narrative:  [name] is a 57 y.o. year old male with medical history significant for alcoholic cirrhosis, T2DM, PTSD, and chronic back pain who presented on 11/07/2017 with 2 weeks of abdominal swelling, worsening jaundice and somnolence and was found to have decompensated cirrhosis prompting admission to critical care for hepatic encephalopathy,hypoglycemia related to continued insulin therapy despite decreased PO intake, presumed SBP ( no paracentesis with insufficient ascites), INR coagulopathy and and eventually intubated (3/16) for inability to protect airway.     Prolonged hospital course included intubation on 3/16 (extubated 3/20) due to hypoxia and somnolence, empiric SBP therapy ( not enough asites to tap)recurrent hypoglycemia requiring IV dextrose, severe cellulitis of lower extremities treated with vancomycin during time in critical care, anuric AKI/ATN prompted discontinuation of Vancomyin( 3/20), empiric IV albumin/midodrine/octreotide treatment (3/23-4/8), CRRT and eventually transition to intermittent HD( 4/2). He was transferred to [facility] service on 11/23/17 for further management and has been undergoing intermittent HD. Palliative is on board as patient has poor prognosis and unclear if he can tolerate HD long term. VVS evaluated and may get an AVG on Friday.   Assessment & Plan:   Active Problems:   Hypoxia   Acute respiratory failure with hypoxia (HCC)   Cirrhosis (HCC)   Acute renal failure (ARF) (HCC)   Goals of care, counseling/discussion   Palliative care by specialist   ESRD (end stage renal disease) (HCC)   Thrombocytopenia (HCC)   Alcohol abuse   Pain, generalized  Anuric Acute Renal Failure - Presumed hemodynamically mediated/ ischemic ATN ( hypotension from SBP, ?some element of vancomycin toxicity which he was on for severe cellulitis for his  lower extremities, some prior concern for HRS and had trial of albumin, midodrine, octreotride 3/23-4/8) -CRRT 3/26-3/29, s/p tunneled cath placement on 4/5, - -Patient is Negative 14.423 L this admission -His BP has been tolerating intermittent HD this past week -Midodrine (less for HRS and more helpful in BP on iHD), octreotide, d/c'd albumin ( by nephro) -Poor prognosis given unclear patient can tolerate long term HD with his BP, palliative medicine following -12.5 mcg fentanyl patch to assist w/ back pain most prominent after HD sessions. We do NOT plan on discharging patient home on a fentanyl patch -Palliative involved for GOC; At this point patient wants to continue with Treatment after discussion with wife -Nephrology consulted and planning on continuing Dialysis -Vein Mapping done and he has an SVT in the Right Basilic Vein and Left Basilic Vein  -Vascular Surgery Consulted and only option is to do a a Left Arm AVG -Will discuss with Nephrology and VVS and repeat INR ordered -If AVG was to be done will likely done on Friday -Appreciate Nephrology Recc's and  Have offered considering dialysis but want to at least try failing outpatient Dialysis   Decompensated Alcoholic cirrhosis with hepatic encephalopathy, stable -Normal mentation and continues to remain w/o asterixis -Not a Candidate for a Liver Transplant .  -Continue Lactulose po 20-g TID or Lactulose Enema 300 mL RC TID and Rifaximin 550 mg po BID regimen.    Hyperbilirubinemia and Elevated AST -Likely related to Cirrhosis; T Bili now 8.1 and AST now 56 -Hepatitis panel on admission negative  -Continue to Monitor CMP in AM   Thrombocytopenia -Improving -Secondary to cirrhosis/hypersplenism -Platelet Count went from 87 -> 92 -Asymptomatic -Continue to Monitor for S/Sx of Bleeding and repeat  CBC in AM   Anemia, likely of chronic disease given ESRD and chronic liver disease -Hb/Hct stable at 10.4/33.7 -Check Anemia  Panel in AM -Continue to Monitor for S/Sx of Bleeding -Repeat CBC in AM   Type 2 DM - Hemoglobin A1C 4.4 (11/2017), -On admission presented with hypoglycemia requiring IV dextrose as patient was still taking insulin despite no PO intake with Hepatic Encephalopathy - A1c 4.4%, likely hypersplenism? (portal hypertension associated with cirrhosis), no blood transfusions here, additionally 50 lb weight loss in 2 months maybe  -Elevated fasting glucose > 300 for several days -Less concern for hypoglycemia given patient is eating well -Increased Lantus to 16 units sq qHS, and increased Novolog Moderate Scale AC to Resistant SSI  Alcohol Abuse - Discussed importance of cessation with family and patient.  -C/w Folic Acid 1 mg po Daily and with Thiamine 100 mg po Daily  -C/w Quetiapine 25 mg po qHS   Acute Hypoxic Respiratory Failure, resolved -S/p 2 intubations during hospitalization likely related to inability to protect airway in setting of decompensated cirrhosis/hepatic encephalopathy and metabolic derangements from worsening renal failure earlier in admission. -Continue to encourage incentive spirometry and monitor Respiratory Status carefully   Acute on Chronic Back pain -Flare worsened by recent HD session, continue oxycodone PRN q4h severe pain, and robaxin.   -C/w 12.5 mcg Fentanyl patch q72h added this admission, will closely monitor to ensure does not negatively affect blood pressure. -C/w Lidocaine Patch TD q24h as well   DVT prophylaxis: SCDs Code Status: Partial Code Family Communication: Discussed with his Wife earlier Disposition Plan: Remain Inpatient for continued workup but when ready to D/C will go home with Home Health PT with RW and 3in1  Consultants:   Nephrology  Palliative Care Medicine  Vascular Surgery    Procedures:  VEIN MAPPING Upper Extremity Vein Map  Right Cephalic: Not visualized  Right Basilic  Segment Diameter Depth Comment  1. Axilla    Not visualized  2. Mid upper arm 2.3043mm 19.689mm   3. Above [facility] 1.3795mm 17.5910mm   4. In [facility] 2.6319mm 11.7650mm Thrombosed  5. Below AC 1.7283mm 18.285mm Thrombosed  6. Mid forearm 1.4492mm 16.6910mm Thrombosed  7. Wrist   Not visualized                  Left Cephalic  Segment Diameter Depth Comment  1. Axilla 1.4828mm 10.615mm   2. Mid upper arm 0.2056mm 3.5619mm   3. Above [facility]   Not visualized  4. In [facility]   Not visualized  5. Below [facility]   Not visualized  6. Mid forearm   Not visualized  7. Wrist   Not visualized                  Left Basilic  Segment Diameter Depth Comment  1. Axilla 2.5155mm 16.67mm Thrombosed  2. Mid upper arm 2.3174mm 12.1160mm Thrombosed  3. Above [facility] 2.5549mm 12.9320mm Thrombosed  4. In AC mm mm Not visualized  5. Below AC mm mm Not visualized  6. Mid forearm mm mm Not visualized  7. Wrist mm mm Not visualized   mm mm    mm mm    mm mm     Antimicrobials:  Anti-infectives (From admission, onward)   Start     Dose/Rate Route Frequency Ordered Stop   11/27/17 0800  ceFAZolin (ANCEF) IVPB 2g/100 mL premix     2 g 200 mL/hr over 30 Minutes Intravenous To Radiology 11/27/17 0747 11/27/17 1455  11/20/17 2200  rifaximin (XIFAXAN) tablet 550 mg     550 mg Oral 2 times daily 11/20/17 1004     11/17/17 1000  rifaximin (XIFAXAN) tablet 550 mg  Status:  Discontinued     550 mg Per Tube 2 times daily 11/17/17 0844 11/20/17 1004   11/16/17 2200  piperacillin-tazobactam (ZOSYN) IVPB 3.375 g  Status:  Discontinued     3.375 g 100 mL/hr over 30 Minutes Intravenous Every 6 hours 11/16/17 1902 11/19/17 1006   11/15/17 1400  piperacillin-tazobactam (ZOSYN) IVPB 3.375 g  Status:  Discontinued     3.375 g 12.5 mL/hr over 240 Minutes Intravenous Every 12 hours 11/15/17 1327 11/16/17 1902   11/11/17 2200  rifaximin (XIFAXAN) tablet 550 mg  Status:  Discontinued     550 mg Oral 2 times daily 11/11/17 1611 11/17/17 0844   11/08/17 2200  vancomycin  (VANCOCIN) 1,500 mg in sodium chloride 0.9 % 500 mL IVPB  Status:  Discontinued     1,500 mg 250 mL/hr over 120 Minutes Intravenous Every 12 hours 11/08/17 1140 11/09/17 1210   11/08/17 0900  vancomycin (VANCOCIN) 2,000 mg in sodium chloride 0.9 % 500 mL IVPB     2,000 mg 250 mL/hr over 120 Minutes Intravenous  Once 11/08/17 0858 11/08/17 1200   11/07/17 1800  cefTRIAXone (ROCEPHIN) 2 g in sodium chloride 0.9 % 100 mL IVPB     2 g 200 mL/hr over 30 Minutes Intravenous Every 24 hours 11/07/17 1753 11/14/17 1759     Subjective: Seen and examined and had some abdominal pain. No CP or SOB. No Nausea or vomiting. No lightheadedness or dizziness. Feels fatigued and tired but does not want to give up because of his grand kids and wants to continue full medical treatment at this time.   Objective: Vitals:   12/01/17 2304 12/02/17 0507 12/02/17 0600 12/02/17 1421  BP: 103/65 104/66  98/64  Pulse: (!) 52 73  94  Resp: 18 18  18   Temp: 98.1 F (36.7 C) 98.6 F (37 C)  98.6 F (37 C)  TempSrc: Oral Oral  Oral  SpO2: 97% 97%  100%  Weight:   68.8 kg (151 lb 9.6 oz)   Height:        Intake/Output Summary (Last 24 hours) at 12/02/2017 1633 Last data filed at 12/02/2017 1610 Gross per 24 hour  Intake 356 ml  Output -  Net 356 ml   Filed Weights   12/01/17 0706 12/01/17 1114 12/02/17 0600  Weight: 72.5 kg (159 lb 13.3 oz) 70.5 kg (155 lb 6.8 oz) 68.8 kg (151 lb 9.6 oz)   Examination: Physical Exam:  Constitutional: WN/WD Jaundiced obese Caucasian Male in NAD and appears calm and comfortable Eyes: Sclerae are icteric. Lids normal.  ENMT: External Ears, Nose appear normal. Grossly normal hearing. Mucous membranes are moist.  Neck: Appears normal, supple, no cervical masses, normal ROM, no appreciable thyromegaly; no JVD Respiratory: Diminished to auscultation bilaterally, no wheezing, rales, rhonchi or crackles. Normal respiratory effort and patient is not tachypenic. No accessory muscle  use.  Cardiovascular: RRR, no murmurs / rubs / gallops. S1 and S2 auscultated. Trace LE edema Abdomen: Soft, slightly tender, Distended due to body habitus and has some ascites. No masses palpated. No appreciable hepatosplenomegaly. Bowel sounds positive x4.  GU: Deferred. Musculoskeletal: No clubbing / cyanosis of digits/nails. No joint deformity upper and lower extremities. Good ROM, no contractures. Normal strength and muscle tone.  Skin: No rashes, lesions, ulcers on a  limited skin eval. Patient is severely jaundiced. No induration; Warm and dry.  Neurologic: CN 2-12 grossly intact with no focal deficits. Has some asterixis  Psychiatric: Normal judgment and insight. Alert and oriented x 3. Normal mood and appropriate affect.   Data Reviewed: I have personally reviewed following labs and imaging studies  CBC: Recent Labs  Lab 11/28/17 0630 11/29/17 0707 11/30/17 0732 12/01/17 0617 12/02/17 0551  WBC 6.4 9.4 6.8 6.2 8.3  HGB 9.2* 9.7* 9.1* 9.5* 10.4*  HCT 30.5* 32.4* 31.1* 30.7* 33.7*  MCV 119.1* 117.4* 117.4* 116.3* 115.0*  PLT 69* 88* 86* 87* 93*   Basic Metabolic Panel: Recent Labs  Lab 11/26/17 0730  11/27/17 1725 11/28/17 0822 11/30/17 0732 12/01/17 0617 12/01/17 1825 12/02/17 0551  NA 140   < > 141 140 138 139  --  138  K 3.9   < > 4.4 3.9 3.7 4.0  --  3.4*  CL 103   < > 107 104 103 103  --  98*  CO2 21*   < > 19* 22 22 22   --  24  GLUCOSE 142*   < > 178* 300* 351* 336* 409* 184*  BUN 15   < > 14 12 16  23*  --  17  CREATININE 5.30*   < > 4.69* 5.37* 5.47* 5.82*  --  4.49*  CALCIUM 8.6*   < > 8.9 8.9 9.3 9.5  --  9.2  PHOS 3.8  --  3.6 4.0 3.0 2.8  --   --    < > = values in this interval not displayed.   GFR: Estimated Creatinine Clearance: 16.8 mL/min (A) (by C-G formula based on SCr of 4.49 mg/dL (H)). Liver Function Tests: Recent Labs  Lab 11/26/17 0619  11/27/17 1725 11/28/17 0822 11/30/17 0732 12/01/17 0617 12/02/17 0551  AST 73*  --   --   --   56*  --  74*  ALT 30  --   --   --  14*  --  13*  ALKPHOS 122  --   --   --  103  --  135*  BILITOT 9.1*  --   --   --  7.9*  --  8.1*  PROT 6.6  --   --   --  7.1  --  7.4  ALBUMIN 3.5   < > 3.9 3.6 4.0 4.1 4.1   < > = values in this interval not displayed.   No results for input(s): LIPASE, AMYLASE in the last 168 hours. No results for input(s): AMMONIA in the last 168 hours. Coagulation Profile: Recent Labs  Lab 11/26/17 0619 11/27/17 0830  INR 1.64 1.64   Cardiac Enzymes: No results for input(s): CKTOTAL, CKMB, CKMBINDEX, TROPONINI in the last 168 hours. BNP (last 3 results) No results for input(s): PROBNP in the last 8760 hours. HbA1C: Recent Labs    11/30/17 0732  HGBA1C 4.4*   CBG: Recent Labs  Lab 12/01/17 1747 12/01/17 2238 12/02/17 0320 12/02/17 0810 12/02/17 1222  GLUCAP 425* 298* 143* 223* 440*   Lipid Profile: No results for input(s): CHOL, HDL, LDLCALC, TRIG, CHOLHDL, LDLDIRECT in the last 72 hours. Thyroid Function Tests: No results for input(s): TSH, T4TOTAL, FREET4, T3FREE, THYROIDAB in the last 72 hours. Anemia Panel: No results for input(s): VITAMINB12, FOLATE, FERRITIN, TIBC, IRON, RETICCTPCT in the last 72 hours. Sepsis Labs: No results for input(s): PROCALCITON, LATICACIDVEN in the last 168 hours.  No results found for this or any previous  visit (from the past 240 hour(s)).   Radiology Studies: No results found.  Scheduled Meds: . chlorhexidine  15 mL Mouth Rinse BID  . Chlorhexidine Gluconate Cloth  6 each Topical Daily  . famotidine  20 mg Oral QHS  . feeding supplement (ENSURE ENLIVE)  237 mL Oral TID BM  . feeding supplement (PRO-STAT SUGAR FREE 64)  30 mL Oral TID  . fentaNYL  12.5 mcg Transdermal Q72H  . folic acid  1 mg Oral Daily  . insulin aspart  0-20 Units Subcutaneous TID WC  . insulin glargine  16 Units Subcutaneous QHS  . lactulose  20 g Per Tube TID   Or  . lactulose  300 mL Rectal TID  . lidocaine  1 patch  Transdermal Q24H  . mouth rinse  15 mL Mouth Rinse q12n4p  . methocarbamol  500 mg Oral TID  . midodrine  10 mg Oral TID WC  . octreotide  200 mcg Subcutaneous BID  . QUEtiapine  25 mg Oral QHS  . rifaximin  550 mg Oral BID  . sodium chloride flush  10-40 mL Intracatheter Q12H  . thiamine  100 mg Oral Daily   Continuous Infusions: . sodium chloride Stopped (11/20/17 0900)    LOS: 25 days   Merlene Laughter, DO [facility] Pager [phone]  If 7PM-7AM, please contact night-coverage [email or web address] Password [facility] 12/02/2017, 4:33 PM

## 2017-12-02 NOTE — Progress Notes (Signed)
1400 SVT to bil basilic vein per vascular seen during vein mapping. Message sent to Dr Marland McalpineSheikh.

## 2017-12-02 NOTE — Consult Note (Addendum)
Hospital Consult    Reason for Consult:  Permanent access Requesting Physician:  Dr. Posey Pronto MRN #:  [id number]  History of Present Illness: This is a 57 y.o. male with past medical history significant for alcoholic cirrhosis, diabetes mellitus type 2, and end-stage renal disease requiring chronic outpatient hemodialysis.  Despite poor prognosis and discussions with palliative care the patient would like to proceed with outpatient dialysis as well as permanent dialysis access placement during this hospitalization.  He is right arm dominant.  He has been dialyzing on a Tuesday Thursday Saturday schedule via right IJ tunneled dialysis catheter placed by interventional radiology during this hospital admission.  Vein mapping of bilateral upper extremities will be performed today.  Past Medical History:  Diagnosis Date  . Chronic lower back pain   . Compression fracture    lumbar 3  . Diabetes mellitus   . Fracture acetabulum-closed ([place]) 04/09/2013  . GERD (gastroesophageal reflux disease)   . H/O Legionnaire's disease   . Hepatomegaly   . Migraine   . Osteoporosis   . Pancreatitis   . PTSD (post-traumatic stress disorder)   . Vitamin D deficiency     Past Surgical History:  Procedure Laterality Date  . BACK SURGERY    . CHOLECYSTECTOMY    . HERNIA REPAIR    . IR FLUORO GUIDE CV LINE RIGHT  11/27/2017  . IR US GUIDE VASC ACCESS RIGHT  11/27/2017  . KYPHOPLASTY N/A 07/16/2015   Procedure: Lumbar three kyphoplasty;  Surgeon: Ashok Pall, MD;  Location: [place] [facility];  Service: Neurosurgery;  Laterality: N/A;  Lumbar three kyphoplasty  . LAMINECTOMY    . MASS EXCISION Left 06/12/2017   Procedure: EXCISION LEFT AXILLARY SEBACEOUS CYST;  Surgeon: Coralie Keens, MD;  Location: [facility];  Service: General;  Laterality: Left;  . STERNUM FRACTURE SURGERY      Allergies  Allergen Reactions  . Ketorolac Tromethamine Hives  . Temsirolimus Other (See Comments)    Unknown  - Pt does not recognize this medication TORISEL-Chemo Drug    Prior to Admission medications   Medication Sig Start Date End Date Taking? Authorizing Provider  albuterol (PROVENTIL HFA;VENTOLIN HFA) 108 (90 Base) MCG/ACT inhaler Inhale 2 puffs into the lungs every 6 (six) hours as needed for wheezing or shortness of breath. 10/16/17  Yes Terald Sleeper, PA-C  carboxymethylcellulose (REFRESH PLUS) 0.5 % SOLN Place 1 drop into both eyes 3 (three) times daily as needed (dry eyes).   Yes [provider]  gabapentin (NEURONTIN) 400 MG capsule Take 3 capsules (1,200 mg total) by mouth 3 (three) times daily. 10/22/17  Yes Dettinger, Fransisca Kaufmann, MD  guaiFENesin (MUCINEX) 600 MG 12 hr tablet Take 2 tablets (1,200 mg total) by mouth 2 (two) times daily. 10/18/17  Yes Mariel Aloe, MD  hydrochlorothiazide (HYDRODIURIL) 25 MG tablet Take 0.5 tablets (12.5 mg total) by mouth 2 (two) times daily. 10/22/17  Yes Dettinger, Fransisca Kaufmann, MD  ibuprofen (ADVIL,MOTRIN) 200 MG tablet Take 600 mg by mouth every 6 (six) hours as needed (back pain).   Yes [provider]  insulin aspart (NOVOLOG FLEXPEN) 100 UNIT/ML FlexPen INJECT 7 TO 15 UNITS subcu up to 4 times daily Patient taking differently: INJECT 9 - 17 UNITS SQ UP TO FOUR TIMES A DAY 10/23/17  Yes Dettinger, Fransisca Kaufmann, MD  Insulin Glargine (LANTUS SOLOSTAR) 100 UNIT/ML Solostar Pen Inject 37 Units into the skin daily. Patient taking differently: Inject 33 Units into the skin daily.  10/22/17  Yes Dettinger, Fransisca Kaufmann, MD  Magnesium 500 MG TABS Take 500 mg by mouth daily.   Yes [provider]  methocarbamol (ROBAXIN) 500 MG tablet Take 500 mg by mouth 2 (two) times daily as needed for muscle spasms.   Yes [provider]  morphine (MSIR) 15 MG tablet Take 15 mg by mouth 3 (three) times daily.    Yes [provider]  pantoprazole (PROTONIX) 40 MG tablet Take 1 tablet (40 mg total) by mouth daily. 10/22/17  Yes Dettinger, Fransisca Kaufmann,  MD  polyethylene glycol (MIRALAX / GLYCOLAX) packet Take 17 g by mouth 2 (two) times daily. 12/01/16  Yes Reyne Dumas, MD  POTASSIUM PO Take 1 tablet by mouth at bedtime as needed (leg cramps).   Yes [provider]  Blood Glucose Monitoring Suppl (ACCU-CHEK AVIVA PLUS) w/Device KIT 1 each by Does not apply route daily. 10/23/17   Dettinger, Fransisca Kaufmann, MD  Glucose Blood (BLOOD GLUCOSE TEST STRIPS) STRP 1 strip by In Vitro route 4 (four) times daily. 10/23/17   Dettinger, Fransisca Kaufmann, MD    Social History   Socioeconomic History  . Marital status: Married    Spouse name: Not on file  . Number of children: Not on file  . Years of education: Not on file  . Highest education level: Not on file  Occupational History  . Not on file  Social Needs  . Financial resource strain: Not on file  . Food insecurity:    Worry: Not on file    Inability: Not on file  . Transportation needs:    Medical: Not on file    Non-medical: Not on file  Tobacco Use  . Smoking status: Former Smoker    Packs/day: 0.50    Years: 26.00    Pack years: 13.00    Types: Cigarettes    Last attempt to quit: 08/26/2007    Years since quitting: 10.2  . Smokeless tobacco: Never Used  Substance and Sexual Activity  . Alcohol use: Yes    Alcohol/week: 3.6 oz    Types: 6 Glasses of wine per week    Comment: 6 oz x 6 glass of wine daily   . Drug use: No  . Sexual activity: Not on file  Lifestyle  . Physical activity:    Days per week: Not on file    Minutes per session: Not on file  . Stress: Not on file  Relationships  . Social connections:    Talks on phone: Not on file    Gets together: Not on file    Attends religious service: Not on file    Active member of club or organization: Not on file    Attends meetings of clubs or organizations: Not on file    Relationship status: Not on file  . Intimate partner violence:    Fear of current or ex partner: Not on file    Emotionally abused: Not on file     Physically abused: Not on file    Forced sexual activity: Not on file  Other Topics Concern  . Not on file  Social History Narrative  . Not on file     Family History  Problem Relation Age of Onset  . Cancer Unknown        breast/grandmother, prostate/grandfather    ROS: Otherwise negative unless mentioned in HPI  Physical Examination  Vitals:   12/01/17 2304 12/02/17 0507  BP: 103/65 104/66  Pulse: (!) 52 73  Resp: 18 18  Temp: 98.1 F (36.7 C) 98.6 F (37 C)  SpO2: 97% 97%   Body mass index is 24.47 kg/m.  General: Jaundiced NAD Gait: Not observed HENT: WNL, normocephalic Pulmonary: normal non-labored breathing, without Rales, rhonchi,  wheezing Cardiac: regular Abdomen: soft, NT/ND, no masses Skin: without rashes; jaundiced Vascular Exam/Pulses: Palpable and symmetrical radial pulses Extremities: without ischemic changes, without Gangrene , without cellulitis; without open wounds;  Musculoskeletal: no muscle wasting or atrophy  Neurologic: A&O X 3;  No focal weakness or paresthesias are detected; speech is fluent/normal Psychiatric:  The pt has Normal affect. Lymph:  Unremarkable  CBC    Component Value Date/Time   WBC 8.3 12/02/2017 0551   RBC 2.93 (L) 12/02/2017 0551   HGB 10.4 (L) 12/02/2017 0551   HGB 13.1 10/22/2017 1542   HCT 33.7 (L) 12/02/2017 0551   HCT 40.3 10/22/2017 1542   PLT 93 (L) 12/02/2017 0551   PLT 107 (L) 10/22/2017 1542   MCV 115.0 (H) 12/02/2017 0551   MCV 110 (H) 10/22/2017 1542   MCH 35.5 (H) 12/02/2017 0551   MCHC 30.9 12/02/2017 0551   RDW 15.0 12/02/2017 0551   RDW 15.1 10/22/2017 1542   LYMPHSABS 0.8 11/24/2017 0429   LYMPHSABS 1.4 10/22/2017 1542   MONOABS 0.4 11/24/2017 0429   EOSABS 0.0 11/24/2017 0429   EOSABS 0.0 10/22/2017 1542   BASOSABS 0.0 11/24/2017 0429   BASOSABS 0.1 10/22/2017 1542    BMET    Component Value Date/Time   NA 138 12/02/2017 0551   NA 137 10/22/2017 1542   K 3.4 (L) 12/02/2017 0551    CL 98 (L) 12/02/2017 0551   CO2 24 12/02/2017 0551   GLUCOSE 184 (H) 12/02/2017 0551   BUN 17 12/02/2017 0551   BUN 5 (L) 10/22/2017 1542   CREATININE 4.49 (H) 12/02/2017 0551   CALCIUM 9.2 12/02/2017 0551   CALCIUM (LL) 04/07/2007 1517    5.5 Result repeated and verified. CRITICAL RESULT CALLED TO, READ BACK BY AND VERIFIED WITH: HUDGSON,M. RN 04/07/07 1031 WAYK CORRECTED ON 08/15 AT 0024: PREVIOUSLY REPORTED AS 5.5 Result repeated and verified.   GFRNONAA 13 (L) 12/02/2017 0551   GFRAA 16 (L) 12/02/2017 0551    COAGS: Lab Results  Component Value Date   INR 1.64 11/27/2017   INR 1.64 11/26/2017   INR 1.45 11/22/2017     Non-Invasive Vascular Imaging:   Vein mapping pending  Statin:  No. Beta Blocker:  No. Aspirin:  No. ACEI:  No. ARB:  No. CCB use:  No Other antiplatelets/anticoagulants:  No.    ASSESSMENT/PLAN: This is a 57 y.o. male with end-stage renal disease requiring hemodialysis  - Patient and wife have agreed to proceed with permanent dialysis access surgery despite poor prognosis given alcoholic cirrhosis - Vein mapping pending - Risks and benefits of dialysis access surgery were discussed with the patient including risk of non-maturing fistula given periods of hypotension noted during admission -This case was discussed with Dr. Donzetta Matters who will evaluate and further discuss treatment plan with the patient later today      Dagoberto Ligas PA-C [facility] [phone]  I have independently interviewed and examined patient and agree with PA assessment and plan above.  Unfortunately he does not have suitable vein for left arm fistula creation as he is right-hand dominant.  This would leave [place] with the only option being a left arm graft.  I will discuss further with patient and Dr. Posey Pronto tomorrow  and we can plan for possible Friday.  We will also check INR given that it was mildly elevated 5 days ago.  Drayke Grabel C. Donzetta Matters, MD [facility] [place] Office: [phone] Pager: [phone]

## 2017-12-02 NOTE — Progress Notes (Signed)
Patient ID: [name], male   DOB: Jan 04, 1961, 57 y.o.   MRN: [id number] [facility] Progress Note   Assessment/ Plan:   1. Anuric acute kidney injury, likely ESRD-on renal replacement therapy since 11/16/17 (CRRT transitioned to IHD).  Based on the initial workup/assessment by Dr. Flossie [name] was determined to have ATN rather than HRS for which RRT was started (initially determined as "temporary dialysis" but time limits not set). He is not a candidate for liver transplant due to ongoing alcohol consumption at the time of admission.  The plan is to continue dialysis on a Tuesday/Thursday/Saturday schedule while he is here in the hospital-we had a lengthy discussion today (in collaboration with the palliative care service) regarding his poor prognosis as well as the difficult course ahead with outpatient hemodialysis-I offered encouragement for him to consider stopping dialysis however, his wife wants him to continue to try this until he truly fails outpatient dialysis.  I consulted with vascular surgery earlier today for dialysis access planning and have ordered a vein mapping.  He does have intermittently low blood pressures that may preclude placement of an arteriovenous graft given risk for thrombosis and indeed may also translate to early fistula failure.  Currently getting hemodialysis via TDC. 2. Alcoholic cirrhosis with encephalopathy-bilirubin levels continue to remain elevated indicating significant hepatic synthetic defect.  Albumin levels artificially elevated by albumin infusions.   3. Thrombocytopenia-secondary to cirrhosis/hypersplenism from portal hypertension 4. Alcohol abuse- will need rehab and ongoing treatment to prevent withdrawal.  5. Disposition-appreciate input from palliative care service and will begin process for outpatient dialysis unit placement unless the patient changes his mind prior to discharge.  Subjective:   Reports that dialysis went well however, he  had profound weakness/asthenia after dialysis but seemingly improved this morning.   Objective:   BP 104/66 (BP Location: Right Arm)   Pulse 73   Temp 98.6 F (37 C) (Oral)   Resp 18   Ht 5\' 6"  (1.676 m)   Wt 68.8 kg (151 lb 9.6 oz) Comment: standing scale  SpO2 97%   BMI 24.47 kg/m   Intake/Output Summary (Last 24 hours) at 12/02/2017 1034 Last data filed at 12/02/2017 0939 Gross per 24 hour  Intake 356 ml  Output 2000 ml  Net -1644 ml   Weight change: 0.3 kg (10.6 oz)  Physical Exam: Gen: Comfortably resting in recliner, wife at bedside, icteric CVS: Pulse regular rhythm, ejection systolic murmur over apex Resp: Decreased breath sounds over bases, no rales, right IJ TDC Abd: Soft, distended, mildly tender over the lower quadrants Ext: 1-2+ bilateral lower extremity edema  Imaging: No results found.  Labs: BMET Recent Labs  Lab 11/26/17 0730 11/27/17 0830 11/27/17 1725 11/28/17 0822 11/30/17 0732 12/01/17 0617 12/01/17 1825 12/02/17 0551  NA 140 139 141 140 138 139  --  138  K 3.9 3.6 4.4 3.9 3.7 4.0  --  3.4*  CL 103 106 107 104 103 103  --  98*  CO2 21* 23 19* 22 22 22   --  24  GLUCOSE 142* 174* 178* 300* 351* 336* 409* 184*  BUN 15 11 14 12 16  23*  --  17  CREATININE 5.30* 4.26* 4.69* 5.37* 5.47* 5.82*  --  4.49*  CALCIUM 8.6* 9.1 8.9 8.9 9.3 9.5  --  9.2  PHOS 3.8  --  3.6 4.0 3.0 2.8  --   --    CBC Recent Labs  Lab 11/29/17 0707 11/30/17 0732 12/01/17 0617 12/02/17 0551  WBC 9.4 6.8 6.2 8.3  HGB 9.7* 9.1* 9.5* 10.4*  HCT 32.4* 31.1* 30.7* 33.7*  MCV 117.4* 117.4* 116.3* 115.0*  PLT 88* 86* 87* 93*    Medications:    . chlorhexidine  15 mL Mouth Rinse BID  . Chlorhexidine Gluconate Cloth  6 each Topical Daily  . famotidine  20 mg Oral QHS  . feeding supplement (ENSURE ENLIVE)  237 mL Oral TID BM  . feeding supplement (PRO-STAT SUGAR FREE 64)  30 mL Oral TID  . fentaNYL  12.5 mcg Transdermal Q72H  . folic acid  1 mg Oral Daily  .  insulin aspart  0-15 Units Subcutaneous TID AC & HS  . insulin glargine  10 Units Subcutaneous QHS  . lactulose  20 g Per Tube TID   Or  . lactulose  300 mL Rectal TID  . lidocaine  1 patch Transdermal Q24H  . mouth rinse  15 mL Mouth Rinse q12n4p  . methocarbamol  500 mg Oral TID  . midodrine  10 mg Oral TID WC  . octreotide  200 mcg Subcutaneous BID  . QUEtiapine  25 mg Oral QHS  . rifaximin  550 mg Oral BID  . sodium chloride flush  10-40 mL Intracatheter Q12H  . thiamine  100 mg Oral Daily   Zetta BillsJay Dreanna Kyllo, MD 12/02/2017, 10:34 AM

## 2017-12-02 NOTE — Progress Notes (Signed)
Physical Therapy Treatment Patient Details Name: [name] MRN: [id number] DOB: 09-Oct-1960 Today's Date: 12/02/2017    History of Present Illness Pt adm with abd swelling and lethargy and found to have acute hepatic encephalopathy. Pt intubated for airway protection  3/17-3/20. Pt also with acute kidney injury. PMH - DM, PTSD, compression fx's and pain/ debilitation, GERD, EtOH abuse with cirrhosis    PT Comments    Continuing work on functional mobility and activity tolerance;  Trialed ambulation with single point cane today, and we both agreed, RW is better; may be worth considering trying a rollator next, to give options to sit if extremely fatigued post HD;   If [name] chooses to pursue outpt HD, he will need to be able to tolerate HD in the recliner, and tolerate being OOB in the chair at least 6 hours at a time; Recommend trying HD in the HD chair next sessions;   I agree with Helmut Muster, NP with Palliative -- [name] cannot be by himself after HD due to lethargy, fatigue, and significantly increased fall risk post HD  Follow Up Recommendations  Home health PT;Supervision/Assistance - 24 hour     Equipment Recommendations  Rolling walker with 5" wheels;3in1 (PT); consider wheelchair and/or rollator RW   Recommendations for Other Services OT consult     Precautions / Restrictions Precautions Precautions: Fall    Mobility  Bed Mobility Overal bed mobility: Needs Assistance Bed Mobility: Supine to Sit     Supine to sit: Min guard(without physical contact)     General bed mobility comments: no physical assist needed  Transfers Overall transfer level: Needs assistance Equipment used: Straight cane Transfers: Sit to/from Stand Sit to Stand: Supervision         General transfer comment: for safety  Ambulation/Gait Ambulation/Gait assistance: Min guard(without physical contact) Ambulation Distance (Feet): 150 Feet Assistive device: Straight cane(and  occasional use of hallway rail) Gait Pattern/deviations: Step-through pattern;Decreased stride length;Trunk flexed Gait velocity: decr   General Gait Details: Used cane entire session; tends to reach out for bil UE support , using hallway rail; able to maintain a conversation (re: HD) during our walk; ultimately he decided he feels mor comfortable with RW, especially when he considers how tired he tends to be after HD   Stairs            Wheelchair Mobility    Modified Rankin (Stroke Patients Only)       Balance     Sitting balance-Leahy Scale: Good       Standing balance-Leahy Scale: Fair                              Cognition Arousal/Alertness: Awake/alert Behavior During Therapy: WFL for tasks assessed/performed Overall Cognitive Status: No family/caregiver present to determine baseline cognitive functioning                                        Exercises      General Comments        Pertinent Vitals/Pain Pain Assessment: 0-10 Pain Score: 8  Pain Location: back, and bil LEs Pain Descriptors / Indicators: Sore Pain Intervention(s): Patient requesting pain meds-RN notified    Home Living                      Prior Function  PT Goals (current goals can now be found in the care plan section) Acute Rehab PT Goals Patient Stated Goal: Not stated, but he did have lots of questions about HD, and at one point indicated he wants his grand children to be able to know him PT Goal Formulation: With patient Time For Goal Achievement: 12/12/17 Potential to Achieve Goals: Good Progress towards PT goals: Progressing toward goals    Frequency    Min 2X/week      PT Plan Current plan remains appropriate    Co-evaluation              AM-PAC PT "6 Clicks" Daily Activity  Outcome Measure  Difficulty turning over in bed (including adjusting bedclothes, sheets and blankets)?: None Difficulty moving from  lying on back to sitting on the side of the bed? : A Little Difficulty sitting down on and standing up from a chair with arms (e.g., wheelchair, bedside commode, etc,.)?: A Lot Help needed moving to and from a bed to chair (including a wheelchair)?: A Little Help needed walking in hospital room?: A Little Help needed climbing 3-5 steps with a railing? : A Little 6 Click Score: 18    End of Session   Activity Tolerance: Patient tolerated treatment well Patient left: in bed;with call bell/phone within reach;Other (comment)(sitting EOB) Nurse Communication: Mobility status;Patient requests pain meds PT Visit Diagnosis: Unsteadiness on feet (R26.81);Other abnormalities of gait and mobility (R26.89);Muscle weakness (generalized) (M62.81)     Time: 1536-1600 PT Time Calculation (min) (ACUTE ONLY): 24 min  Charges:  $Gait Training: 23-37 mins                    G Codes:       Van ClinesHolly Drena Ham, PT  Acute Rehabilitation Services Pager [phone] Office [phone]    Levi AlandHolly H Elenor Wildes 12/02/2017, 4:50 PM

## 2017-12-02 NOTE — Progress Notes (Signed)
Inpatient Diabetes Program Recommendations  AACE/ADA: New Consensus Statement on Inpatient Glycemic Control (2015)  Target Ranges:  Prepandial:   less than 140 mg/dL      Peak postprandial:   less than 180 mg/dL (1-2 hours)      Critically ill patients:  140 - 180 mg/dL   Lab Results  Component Value Date   GLUCAP 440 (H) 12/02/2017   HGBA1C 4.4 (L) 11/30/2017    Review of Glycemic ControlResults for [name], Tyden P (MRN [id number]) as of 12/02/2017 12:38  Ref. Range 12/01/2017 17:47 12/01/2017 22:38 12/02/2017 03:20 12/02/2017 08:10 12/02/2017 12:22  Glucose-Capillary Latest Ref Range: 65 - 99 mg/dL 562425 (H) 130298 (H) 865143 (H) 223 (H) 440 (H)    Diabetes history: Type 2 DM Outpatient Diabetes medications: Novolog flexpen 9-17 units up to 4 times a day, Lantus 33 units daily Current orders for Inpatient glycemic control:  Novolog resistant tid with meals, Lantus 10 units q HS  Inpatient Diabetes Program Recommendations:    Please consider increasing Lantus to 16 units q HS and consider adding Novolog meal coverage 4 units tid with meals (hold if patient eats less than 50%).   Thanks,  Beryl MeagerJenny Tyce Delcid, RN, BC-ADM Inpatient Diabetes Coordinator Pager [phone] (8a-5p)

## 2017-12-03 LAB — COMPREHENSIVE METABOLIC PANEL
ALBUMIN: 4.4 g/dL (ref 3.5–5.0)
ALT: 14 U/L — ABNORMAL LOW (ref 17–63)
ANION GAP: 17 — AB (ref 5–15)
AST: 84 U/L — ABNORMAL HIGH (ref 15–41)
Alkaline Phosphatase: 144 U/L — ABNORMAL HIGH (ref 38–126)
BUN: 27 mg/dL — ABNORMAL HIGH (ref 6–20)
CO2: 22 mmol/L (ref 22–32)
Calcium: 9.7 mg/dL (ref 8.9–10.3)
Chloride: 101 mmol/L (ref 101–111)
Creatinine, Ser: 6.03 mg/dL — ABNORMAL HIGH (ref 0.61–1.24)
GFR calc Af Amer: 11 mL/min — ABNORMAL LOW (ref >60)
GFR calc non Af Amer: 9 mL/min — ABNORMAL LOW (ref >60)
Glucose, Bld: 145 mg/dL — ABNORMAL HIGH (ref 65–99)
POTASSIUM: 3.7 mmol/L (ref 3.5–5.1)
SODIUM: 140 mmol/L (ref 135–145)
Total Bilirubin: 8 mg/dL — ABNORMAL HIGH (ref 0.3–1.2)
Total Protein: 7.9 g/dL (ref 6.5–8.1)

## 2017-12-03 LAB — CBC WITH DIFFERENTIAL/PLATELET
BASOS ABS: 0.1 10*3/uL (ref 0.0–0.1)
Basophils Relative: 1 %
EOS ABS: 0.2 10*3/uL (ref 0.0–0.7)
Eosinophils Relative: 3 %
HCT: 34.1 % — ABNORMAL LOW (ref 39.0–52.0)
HEMOGLOBIN: 10.8 g/dL — AB (ref 13.0–17.0)
LYMPHS PCT: 35 %
Lymphs Abs: 2.7 10*3/uL (ref 0.7–4.0)
MCH: 35.9 pg — ABNORMAL HIGH (ref 26.0–34.0)
MCHC: 31.7 g/dL (ref 30.0–36.0)
MCV: 113.3 fL — ABNORMAL HIGH (ref 78.0–100.0)
Monocytes Absolute: 0.7 10*3/uL (ref 0.1–1.0)
Monocytes Relative: 9 %
Neutro Abs: 3.9 10*3/uL (ref 1.7–7.7)
Neutrophils Relative %: 52 %
Platelets: 95 10*3/uL — ABNORMAL LOW (ref 150–400)
RBC: 3.01 MIL/uL — ABNORMAL LOW (ref 4.22–5.81)
RDW: 14.9 % (ref 11.5–15.5)
WBC: 7.6 10*3/uL (ref 4.0–10.5)

## 2017-12-03 LAB — GLUCOSE, CAPILLARY
GLUCOSE-CAPILLARY: 158 mg/dL — AB (ref 65–99)
Glucose-Capillary: 262 mg/dL — ABNORMAL HIGH (ref 65–99)
Glucose-Capillary: 115 mg/dL — ABNORMAL HIGH (ref 65–99)
Glucose-Capillary: 141 mg/dL — ABNORMAL HIGH (ref 65–99)

## 2017-12-03 LAB — MAGNESIUM: Magnesium: 2 mg/dL (ref 1.7–2.4)

## 2017-12-03 LAB — PHOSPHORUS: Phosphorus: 2.7 mg/dL (ref 2.5–4.6)

## 2017-12-03 NOTE — Progress Notes (Addendum)
  Progress Note    12/03/2017 9:39 AM * No surgery found *  Subjective: No acute issues, has questions regarding dialysis timing   Vitals:   12/02/17 2054 12/03/17 0627  BP: 122/86 104/74  Pulse: 71 75  Resp: 18 18  Temp: 98.4 F (36.9 C) 97.7 F (36.5 C)  SpO2: 97% 91%    Physical Exam: Noticeably jaundiced No acute distress Palpable radial pulses bilaterally   CBC    Component Value Date/Time   WBC 7.6 12/03/2017 0656   RBC 3.01 (L) 12/03/2017 0656   HGB 10.8 (L) 12/03/2017 0656   HGB 13.1 10/22/2017 1542   HCT 34.1 (L) 12/03/2017 0656   HCT 40.3 10/22/2017 1542   PLT PENDING 12/03/2017 0656   PLT 107 (L) 10/22/2017 1542   MCV 113.3 (H) 12/03/2017 0656   MCV 110 (H) 10/22/2017 1542   MCH 35.9 (H) 12/03/2017 0656   MCHC 31.7 12/03/2017 0656   RDW 14.9 12/03/2017 0656   RDW 15.1 10/22/2017 1542   LYMPHSABS PENDING 12/03/2017 0656   LYMPHSABS 1.4 10/22/2017 1542   MONOABS PENDING 12/03/2017 0656   EOSABS PENDING 12/03/2017 0656   EOSABS 0.0 10/22/2017 1542   BASOSABS PENDING 12/03/2017 0656   BASOSABS 0.1 10/22/2017 1542    BMET    Component Value Date/Time   NA 140 12/03/2017 0656   NA 137 10/22/2017 1542   K 3.7 12/03/2017 0656   CL 101 12/03/2017 0656   CO2 22 12/03/2017 0656   GLUCOSE 145 (H) 12/03/2017 0656   BUN 27 (H) 12/03/2017 0656   BUN 5 (L) 10/22/2017 1542   CREATININE 6.03 (H) 12/03/2017 0656   CALCIUM 9.7 12/03/2017 0656   CALCIUM (LL) 04/07/2007 1517    5.5 Result repeated and verified. CRITICAL RESULT CALLED TO, READ BACK BY AND VERIFIED WITH: HUDGSON,M. RN 04/07/07 1031 WAYK CORRECTED ON 08/15 AT 0024: PREVIOUSLY REPORTED AS 5.5 Result repeated and verified.   GFRNONAA 9 (L) 12/03/2017 0656   GFRAA 11 (L) 12/03/2017 0656    INR    Component Value Date/Time   INR 1.64 11/27/2017 0830     Intake/Output Summary (Last 24 hours) at 12/03/2017 0939 Last data filed at 12/02/2017 2246 Gross per 24 hour  Intake 120 ml  Output -    Net 120 ml     Assessment:  57 y.o. male is here with end-stage renal disease.  Currently on dialysis via tunnel catheter.  Will need permanent access.  Plan: Unfortunately he does not have any suitable vein for dialysis access creation.  This leaves him with his only option as a graft.  We can plan this is soon as tomorrow. I will discuss with nephrology further today.   Jagger Demonte C. Randie Heinzain, MD [facility] Office: [phone] Pager: [phone]  12/03/2017 9:39 AM   Addendum  I discussed case with Dr. Allena KatzPatel at this time we will hold on AV graft.  Patient can follow-up in the office for further discussion.  If plans change we can place graft prior to discharge.  Lemar LivingsBrandon Velvia Mehrer

## 2017-12-03 NOTE — Progress Notes (Signed)
Patient ID: [name], male   DOB: 04/16/1961, 57 y.o.   MRN: [id number] [facility] Progress Note   Assessment/ Plan:   1. Anuric acute kidney injury, likely ESRD-on renal replacement therapy since 11/16/17 (CRRT transitioned to IHD).  Based on the initial workup/assessment by Dr. Flossie DibbleWebb-he was determined to have ATN rather than HRS for which RRT was started (initially determined as "temporary dialysis" but time limits not set). He is not a candidate for liver transplant due to ongoing alcohol consumption at the time of admission.  Continue hemodialysis on a TTS schedule.  Seen yesterday by vascular surgery and based on vein mapping, the only option for dialysis access is a graft-I highly appreciate their input.  We will begin the process for outpatient dialysis unit placement.  We again had a lengthy discussion about the palliative care option. 2. Alcoholic cirrhosis with encephalopathy-not a candidate for liver transplantation referral at this time given recent alcohol use, albumin levels remain surprisingly elevated post infusions/hemodialysis.  3. Thrombocytopenia-secondary to cirrhosis/hypersplenism from portal hypertension.  Without bleeding diastasis. 4. Alcohol abuse-we educated regarding cessation from alcohol and need for follow-up with gastroenterology.  5. Disposition-appreciate input from palliative care service and will begin process for outpatient dialysis unit placement unless the patient changes his mind prior to discharge.  Subjective:   Reports to be feeling better, denies any chest pain or shortness of breath.  Remains conflicted about chronic hemodialysis.   Objective:   BP 104/74 (BP Location: Right Arm)   Pulse 75   Temp 97.7 F (36.5 C) (Oral)   Resp 18   Ht 5\' 6"  (1.676 m)   Wt 69.5 kg (153 lb 4.8 oz) Comment: standing scale  SpO2 91%   BMI 24.74 kg/m   Intake/Output Summary (Last 24 hours) at 12/03/2017 1043 Last data filed at 12/02/2017  2246 Gross per 24 hour  Intake 120 ml  Output -  Net 120 ml   Weight change: -2.964 kg (-6 lb 8.5 oz)  Physical Exam: Gen: Comfortably resting in recliner, icteric CVS: Pulse regular rhythm, ejection systolic murmur over apex Resp: Decreased breath sounds over bases, no rales, right IJ TDC Abd: Soft, distended, mildly tender over the lower quadrants Ext: 1-2+ bilateral lower extremity edema  Imaging: No results found.  Labs: BMET Recent Labs  Lab 11/27/17 0830 11/27/17 1725 11/28/17 [id number] 11/30/17 0732 12/01/17 0617 12/01/17 1825 12/02/17 0551 12/03/17 0656  NA 139 141 140 138 139  --  138 140  K 3.6 4.4 3.9 3.7 4.0  --  3.4* 3.7  CL 106 107 104 103 103  --  98* 101  CO2 23 19* 22 22 22   --  24 22  GLUCOSE 174* 178* 300* 351* 336* 409* 184* 145*  BUN 11 14 12 16  23*  --  17 27*  CREATININE 4.26* 4.69* 5.37* 5.47* 5.82*  --  4.49* 6.03*  CALCIUM 9.1 8.9 8.9 9.3 9.5  --  9.2 9.7  PHOS  --  3.6 4.0 3.0 2.8  --   --  2.7   CBC Recent Labs  Lab 11/30/17 0732 12/01/17 0617 12/02/17 0551 12/03/17 0656  WBC 6.8 6.2 8.3 7.6  NEUTROABS  --   --   --  3.9  HGB 9.1* 9.5* 10.4* 10.8*  HCT 31.1* 30.7* 33.7* 34.1*  MCV 117.4* 116.3* 115.0* 113.3*  PLT 86* 87* 93* 95*    Medications:    . chlorhexidine  15 mL Mouth Rinse BID  . Chlorhexidine  Gluconate Cloth  6 each Topical Daily  . famotidine  20 mg Oral QHS  . feeding supplement (ENSURE ENLIVE)  237 mL Oral TID BM  . feeding supplement (PRO-STAT SUGAR FREE 64)  30 mL Oral TID  . fentaNYL  12.5 mcg Transdermal Q72H  . folic acid  1 mg Oral Daily  . insulin aspart  0-20 Units Subcutaneous TID WC  . insulin glargine  16 Units Subcutaneous QHS  . lactulose  20 g Per Tube TID   Or  . lactulose  300 mL Rectal TID  . lidocaine  1 patch Transdermal Q24H  . mouth rinse  15 mL Mouth Rinse q12n4p  . methocarbamol  500 mg Oral TID  . midodrine  10 mg Oral TID WC  . octreotide  200 mcg Subcutaneous BID  . QUEtiapine  25  mg Oral QHS  . rifaximin  550 mg Oral BID  . sodium chloride flush  10-40 mL Intracatheter Q12H  . thiamine  100 mg Oral Daily   Zetta Bills, MD 12/03/2017, 10:43 AM

## 2017-12-03 NOTE — Progress Notes (Addendum)
1325 Pt to Hemodialysis, A&[facility] x[facility]. [facility] Received report from [facility] at Hemodialysis, completed HD, pt is coming back. 1925 Pt is not back yet. Endorsed.

## 2017-12-03 NOTE — Care Management Important Message (Signed)
Important Message  Patient Details  Name: [name] MRN: [id number] Date of Birth: 02/19/1961   Medicare Important Message Given:  Yes    Amandalee Lacap 12/03/2017, 1:17 PM

## 2017-12-03 NOTE — Progress Notes (Signed)
PROGRESS NOTE    DELFINO FRIESEN  ZOX:[id number] DOB: [birth year] DOA: 11/07/2017 PCP: Dettinger, Elige Radon, MD  Brief Narrative:  [name] is a 57 y.o. year old male with medical history significant for alcoholic cirrhosis, T2DM, PTSD, and chronic back pain who presented on 11/07/2017 with 2 weeks of abdominal swelling, worsening jaundice and somnolence and was found to have decompensated cirrhosis prompting admission to critical care for hepatic encephalopathy,hypoglycemia related to continued insulin therapy despite decreased PO intake, presumed SBP ( no paracentesis with insufficient ascites), INR coagulopathy and and eventually intubated (3/16) for inability to protect airway.     Prolonged hospital course included intubation on 3/16 (extubated 3/20) due to hypoxia and somnolence, empiric SBP therapy ( not enough asites to tap)recurrent hypoglycemia requiring IV dextrose, severe cellulitis of lower extremities treated with vancomycin during time in critical care, anuric AKI/ATN prompted discontinuation of Vancomyin( 3/20), empiric IV albumin/midodrine/octreotide treatment (3/23-4/8), CRRT and eventually transition to intermittent HD( 4/2). He was transferred to [facility] service on 11/23/17 for further management and has been undergoing intermittent HD. Palliative is on board as patient has poor prognosis and unclear if he can tolerate HD long term. VVS evaluated and may get an AVG on Friday however after Nephrology and Vascular discussed case will hold on AVG at this time and will continue Dialysis through [facility] and process for outpatient Dialysis placement started per Nephro.  Assessment & Plan:   Active Problems:   Hypoxia   Acute respiratory failure with hypoxia (HCC)   Cirrhosis (HCC)   Acute renal failure (ARF) (HCC)   Goals of care, counseling/discussion   Palliative care by specialist   ESRD (end stage renal disease) (HCC)   Thrombocytopenia (HCC)   Alcohol abuse   Pain,  generalized  Anuric Acute Renal Failure - Presumed hemodynamically mediated/ ischemic ATN ( hypotension from SBP, ?some element of vancomycin toxicity which he was on for severe cellulitis for his lower extremities, some prior concern for HRS and had trial of albumin, midodrine, octreotride 3/23-4/8) -CRRT 3/26-3/29, s/p tunneled cath placement on 4/5, - -Patient is Negative 14.423 L this admission -His BP has been tolerating intermittent HD this past week -Midodrine (less for HRS and more helpful in BP on iHD), octreotide, d/c'd albumin ( by nephro) -Poor prognosis given unclear patient can tolerate long term HD with his BP, palliative medicine following -12.5 mcg fentanyl patch to assist w/ back pain most prominent after HD sessions. We do NOT plan on discharging patient home on a fentanyl patch -Palliative involved for GOC; At this point patient wants to continue with Treatment after discussion with wife -Nephrology consulted and planning on continuing Dialysis -Vein Mapping done and he has an SVT in the Right Basilic Vein and Left Basilic Vein  -Vascular Surgery Consulted and only option is to do a a Left Arm AVG -Will discuss with Nephrology and VVS and repeat INR ordered -If AVG was to be done will likely done on Friday however after discussion this will not be pursued at this time.  -Appreciate Nephrology Recc's and they will start search for outpatient Dialysis centers.   Decompensated Alcoholic cirrhosis with hepatic encephalopathy, stable -Normal mentation and continues to remain w/o asterixis -Not a Candidate for a Liver Transplant .  -Continue Lactulose po 20-g TID or Lactulose Enema 300 mL RC TID and Rifaximin 550 mg po BID regimen.    Hyperbilirubinemia and Elevated AST -Likely related to Cirrhosis; T Bili now 8.0 and AST now 684 -Hepatitis panel  on admission negative  -Continue to Monitor CMP in AM   Thrombocytopenia -Improving -Secondary to  cirrhosis/hypersplenism -Platelet Count went from 87 -> 93 -> 95 -Asymptomatic -Continue to Monitor for S/Sx of Bleeding and repeat CBC in AM   Anemia, likely of chronic disease given ESRD and chronic liver disease -Hb/Hct stable at 10.8/34.1 -Check Anemia Panel in AM -Continue to Monitor for S/Sx of Bleeding -Repeat CBC in AM   Type 2 DM - Hemoglobin A1C 4.4 (11/2017), -On admission presented with hypoglycemia requiring IV dextrose as patient was still taking insulin despite no PO intake with Hepatic Encephalopathy - A1c 4.4%, likely hypersplenism? (portal hypertension associated with cirrhosis), no blood transfusions here, additionally 50 lb weight loss in 2 months maybe  -Elevated fasting glucose > 300 for several days -Less concern for hypoglycemia given patient is eating well -Increased Lantus to 16 units sq qHS, and increased Novolog Moderate Scale AC to Resistant SSI -CBG's ranging from 115-262  Alcohol Abuse - Discussed importance of cessation with family and patient.  -C/w Folic Acid 1 mg po Daily and with Thiamine 100 mg po Daily  -C/w Quetiapine 25 mg po qHS   Acute Hypoxic Respiratory Failure, resolved -S/p 2 intubations during hospitalization likely related to inability to protect airway in setting of decompensated cirrhosis/hepatic encephalopathy and metabolic derangements from worsening renal failure earlier in admission. -Continue to encourage incentive spirometry and monitor Respiratory Status carefully   Acute on Chronic Back pain -Flare worsened by recent HD session, continue oxycodone PRN q4h severe pain, and robaxin.   -C/w 12.5 mcg Fentanyl patch q72h added this admission, will closely monitor to ensure does not negatively affect blood pressure. -C/w Lidocaine Patch TD q24h as well   DVT prophylaxis: SCDs Code Status: Partial Code Family Communication: No family at bedside  Disposition Plan: Remain Inpatient for continued workup but when ready to D/C will  go home with Home Health PT with RW and 3in1 when Dialysis Slot Available   Consultants:   Nephrology  Palliative Care Medicine  Vascular Surgery    Procedures:  VEIN MAPPING Upper Extremity Vein Map  Right Cephalic: Not visualized  Right Basilic  Segment Diameter Depth Comment  1. Axilla   Not visualized  2. Mid upper arm 2.46mm 19.2mm   3. Above [facility] 1.10mm 17.12mm   4. In [facility] 2.64mm 11.62mm Thrombosed  5. Below AC 1.19mm 18.109mm Thrombosed  6. Mid forearm 1.50mm 16.73mm Thrombosed  7. Wrist   Not visualized                  Left Cephalic  Segment Diameter Depth Comment  1. Axilla 1.30mm 10.60mm   2. Mid upper arm 0.28mm 3.76mm   3. Above [facility]   Not visualized  4. In [facility]   Not visualized  5. Below [facility]   Not visualized  6. Mid forearm   Not visualized  7. Wrist   Not visualized                  Left Basilic  Segment Diameter Depth Comment  1. Axilla 2.45mm 16.45mm Thrombosed  2. Mid upper arm 2.94mm 12.11mm Thrombosed  3. Above [facility] 2.47mm 12.13mm Thrombosed  4. In AC mm mm Not visualized  5. Below AC mm mm Not visualized  6. Mid forearm mm mm Not visualized  7. Wrist mm mm Not visualized   mm mm    mm mm    mm mm     Antimicrobials:  Anti-infectives (From  admission, onward)   Start     Dose/Rate Route Frequency Ordered Stop   11/27/17 0800  ceFAZolin (ANCEF) IVPB 2g/100 mL premix     2 g 200 mL/hr over 30 Minutes Intravenous To Radiology 11/27/17 0747 11/27/17 1455   11/20/17 2200  rifaximin (XIFAXAN) tablet 550 mg     550 mg Oral 2 times daily 11/20/17 1004     11/17/17 1000  rifaximin (XIFAXAN) tablet 550 mg  Status:  Discontinued     550 mg Per Tube 2 times daily 11/17/17 0844 11/20/17 1004   11/16/17 2200  piperacillin-tazobactam (ZOSYN) IVPB 3.375 g  Status:  Discontinued     3.375 g 100 mL/hr over 30 Minutes Intravenous Every 6 hours 11/16/17 1902 11/19/17 1006   11/15/17 1400   piperacillin-tazobactam (ZOSYN) IVPB 3.375 g  Status:  Discontinued     3.375 g 12.5 mL/hr over 240 Minutes Intravenous Every 12 hours 11/15/17 1327 11/16/17 1902   11/11/17 2200  rifaximin (XIFAXAN) tablet 550 mg  Status:  Discontinued     550 mg Oral 2 times daily 11/11/17 1611 11/17/17 0844   11/08/17 2200  vancomycin (VANCOCIN) 1,500 mg in sodium chloride 0.9 % 500 mL IVPB  Status:  Discontinued     1,500 mg 250 mL/hr over 120 Minutes Intravenous Every 12 hours 11/08/17 1140 11/09/17 1210   11/08/17 0900  vancomycin (VANCOCIN) 2,000 mg in sodium chloride 0.9 % 500 mL IVPB     2,000 mg 250 mL/hr over 120 Minutes Intravenous  Once 11/08/17 0858 11/08/17 1200   11/07/17 1800  cefTRIAXone (ROCEPHIN) 2 g in sodium chloride 0.9 % 100 mL IVPB     2 g 200 mL/hr over 30 Minutes Intravenous Every 24 hours 11/07/17 1753 11/14/17 1759     Subjective: Seen and examined and had pain throughout. Wanted to know if he could dialyze earlier like he used too. No SOB. Felt fatigued but still wanted to pursue Dialysis.   Objective: Vitals:   12/03/17 1600 12/03/17 1630 12/03/17 1700 12/03/17 1730  BP: 104/74 99/74 (!) 92/59 106/60  Pulse: 65 64 (!) 56 (!) 59  Resp:    (!) 59  Temp:    98.2 F (36.8 C)  TempSrc:    Oral  SpO2:    95%  Weight:      Height:        Intake/Output Summary (Last 24 hours) at 12/03/2017 2031 Last data filed at 12/03/2017 1730 Gross per 24 hour  Intake 840 ml  Output 1000 ml  Net -160 ml   Filed Weights   12/02/17 0600 12/03/17 0500 12/03/17 1300  Weight: 68.8 kg (151 lb 9.6 oz) 69.5 kg (153 lb 4.8 oz) 72 kg (158 lb 11.7 oz)   Examination: Physical Exam:  Constitutional: WN/WD Jaundiced Caucasian male in NAD appears Calm and Comfortable. Eyes: Sclerae anicteric, Lids normal ENMT: External Ears and nose appear normal. MMM Neck: Supple with no JVD Respiratory: Diminished to auscultation; No appreciable wheezing/rales/rhonchi. Normal respiratory  efo Cardiovascular: RRR; No m/r/g. Mild LE edema Abdomen: Soft, Mildly tender, Distended due to body habitus and ascites; Bowel sounds present GU: Deferred Musculoskeletal: No contractures; No cyanosis; Good ROM Skin: Jaundiced skin. No rashes but has LE changes and mepilex pads on left leg Neurologic: CN 2-12 grossly intact. Mild asterixis Psychiatric: Normal mood and affect. Intact judgement and insight  Data Reviewed: I have personally reviewed following labs and imaging studies  CBC: Recent Labs  Lab 11/29/17 0707 11/30/17 0732 12/01/17  7829 12/02/17 0551 12/03/17 0656  WBC 9.4 6.8 6.2 8.3 7.6  NEUTROABS  --   --   --   --  3.9  HGB 9.7* 9.1* 9.5* 10.4* 10.8*  HCT 32.4* 31.1* 30.7* 33.7* 34.1*  MCV 117.4* 117.4* 116.3* 115.0* 113.3*  PLT 88* 86* 87* 93* 95*   Basic Metabolic Panel: Recent Labs  Lab 11/27/17 1725 11/28/17 0822 11/30/17 0732 12/01/17 0617 12/01/17 1825 12/02/17 0551 12/03/17 0656  NA 141 140 138 139  --  138 140  K 4.4 3.9 3.7 4.0  --  3.4* 3.7  CL 107 104 103 103  --  98* 101  CO2 19* 22 22 22   --  24 22  GLUCOSE 178* 300* 351* 336* 409* 184* 145*  BUN 14 12 16  23*  --  17 27*  CREATININE 4.69* 5.37* 5.47* 5.82*  --  4.49* 6.03*  CALCIUM 8.9 8.9 9.3 9.5  --  9.2 9.7  MG  --   --   --   --   --   --  2.0  PHOS 3.6 4.0 3.0 2.8  --   --  2.7   GFR: Estimated Creatinine Clearance: 12.5 mL/min (A) (by C-G formula based on SCr of 6.03 mg/dL (H)). Liver Function Tests: Recent Labs  Lab 11/28/17 0822 11/30/17 0732 12/01/17 0617 12/02/17 0551 12/03/17 0656  AST  --  56*  --  74* 84*  ALT  --  14*  --  13* 14*  ALKPHOS  --  103  --  135* 144*  BILITOT  --  7.9*  --  8.1* 8.0*  PROT  --  7.1  --  7.4 7.9  ALBUMIN 3.6 4.0 4.1 4.1 4.4   No results for input(s): LIPASE, AMYLASE in the last 168 hours. No results for input(s): AMMONIA in the last 168 hours. Coagulation Profile: Recent Labs  Lab 11/27/17 0830  INR 1.64   Cardiac Enzymes: No  results for input(s): CKTOTAL, CKMB, CKMBINDEX, TROPONINI in the last 168 hours. BNP (last 3 results) No results for input(s): PROBNP in the last 8760 hours. HbA1C: No results for input(s): HGBA1C in the last 72 hours. CBG: Recent Labs  Lab 12/02/17 1826 12/02/17 2214 12/03/17 0810 12/03/17 1208 12/03/17 1941  GLUCAP 254* 148* 158* 262* 115*   Lipid Profile: No results for input(s): CHOL, HDL, LDLCALC, TRIG, CHOLHDL, LDLDIRECT in the last 72 hours. Thyroid Function Tests: No results for input(s): TSH, T4TOTAL, FREET4, T3FREE, THYROIDAB in the last 72 hours. Anemia Panel: No results for input(s): VITAMINB12, FOLATE, FERRITIN, TIBC, IRON, RETICCTPCT in the last 72 hours. Sepsis Labs: No results for input(s): PROCALCITON, LATICACIDVEN in the last 168 hours.  No results found for this or any previous visit (from the past 240 hour(s)).   Radiology Studies: No results found.  Scheduled Meds: . chlorhexidine  15 mL Mouth Rinse BID  . Chlorhexidine Gluconate Cloth  6 each Topical Daily  . famotidine  20 mg Oral QHS  . feeding supplement (ENSURE ENLIVE)  237 mL Oral TID BM  . feeding supplement (PRO-STAT SUGAR FREE 64)  30 mL Oral TID  . fentaNYL  12.5 mcg Transdermal Q72H  . folic acid  1 mg Oral Daily  . insulin aspart  0-20 Units Subcutaneous TID WC  . insulin glargine  16 Units Subcutaneous QHS  . lactulose  20 g Per Tube TID   Or  . lactulose  300 mL Rectal TID  . lidocaine  1 patch Transdermal  Q24H  . mouth rinse  15 mL Mouth Rinse q12n4p  . methocarbamol  500 mg Oral TID  . midodrine  10 mg Oral TID WC  . octreotide  200 mcg Subcutaneous BID  . QUEtiapine  25 mg Oral QHS  . rifaximin  550 mg Oral BID  . sodium chloride flush  10-40 mL Intracatheter Q12H  . thiamine  100 mg Oral Daily   Continuous Infusions: . sodium chloride Stopped (11/20/17 0900)    LOS: 26 days   Merlene Laughtermair Latif Sheikh, DO [facility] Pager [phone]  If 7PM-7AM, please contact  night-coverage [email or web address] Password [email or web address] 12/03/2017, 8:31 PM

## 2017-12-03 NOTE — Progress Notes (Signed)
Palliative:  I met today with Marjory Lies. No family at bedside. [name] is asking me about a cruise on a boat where he can have dialysis?? I was very confused as this was not a discussion that we have had and I know nothing of this subject. Marjory Lies cannot recall who he discussed this with. I explained my concerns that IF this does exist I worry as many serious complications that may occur in dialysis that require hospitalization for treatment.   His whole thought process is to pursue some type of vacation or something that he and wife Apolonio Schneiders can enjoy. He shares that he would like to leave her with some good memories with something like this. This is a big step that he is openly discussing today his own mortality and is more concerned with his wife. He continues to desire pursuing dialysis at this point. Emotional support provided.   25 min  Vinie Sill, NP Palliative Medicine Team Pager # [phone] (M-F 8a-5p) Team Phone # [phone] (Nights/Weekends)

## 2017-12-04 DIAGNOSIS — Z992 Dependence on renal dialysis: Secondary | ICD-10-CM

## 2017-12-04 LAB — COMPREHENSIVE METABOLIC PANEL
ALT: 10 U/L — AB (ref 17–63)
AST: 78 U/L — AB (ref 15–41)
Albumin: 3.7 g/dL (ref 3.5–5.0)
Alkaline Phosphatase: 132 U/L — ABNORMAL HIGH (ref 38–126)
Anion gap: 11 (ref 5–15)
BILIRUBIN TOTAL: 6.6 mg/dL — AB (ref 0.3–1.2)
BUN: 16 mg/dL (ref 6–20)
CO2: 24 mmol/L (ref 22–32)
CREATININE: 3.98 mg/dL — AB (ref 0.61–1.24)
Calcium: 8.5 mg/dL — ABNORMAL LOW (ref 8.9–10.3)
Chloride: 102 mmol/L (ref 101–111)
GFR, EST AFRICAN AMERICAN: 18 mL/min — AB (ref >60)
GFR, EST NON AFRICAN AMERICAN: 16 mL/min — AB (ref >60)
Glucose, Bld: 231 mg/dL — ABNORMAL HIGH (ref 65–99)
POTASSIUM: 3.9 mmol/L (ref 3.5–5.1)
Sodium: 137 mmol/L (ref 135–145)
TOTAL PROTEIN: 7.2 g/dL (ref 6.5–8.1)

## 2017-12-04 LAB — CBC WITH DIFFERENTIAL/PLATELET
Basophils Absolute: 0.1 10*3/uL (ref 0.0–0.1)
Basophils Relative: 1 %
Eosinophils Absolute: 0.2 10*3/uL (ref 0.0–0.7)
Eosinophils Relative: 3 %
HEMATOCRIT: 30.5 % — AB (ref 39.0–52.0)
Hemoglobin: 9.6 g/dL — ABNORMAL LOW (ref 13.0–17.0)
LYMPHS ABS: 2.2 10*3/uL (ref 0.7–4.0)
Lymphocytes Relative: 35 %
MCH: 35.6 pg — ABNORMAL HIGH (ref 26.0–34.0)
MCHC: 31.5 g/dL (ref 30.0–36.0)
MCV: 113 fL — AB (ref 78.0–100.0)
MONOS PCT: 10 %
Monocytes Absolute: 0.6 10*3/uL (ref 0.1–1.0)
Neutro Abs: 3.1 10*3/uL (ref 1.7–7.7)
Neutrophils Relative %: 51 %
Platelets: 73 10*3/uL — ABNORMAL LOW (ref 150–400)
RBC: 2.7 MIL/uL — AB (ref 4.22–5.81)
RDW: 14.9 % (ref 11.5–15.5)
WBC: 6.2 10*3/uL (ref 4.0–10.5)

## 2017-12-04 LAB — GLUCOSE, CAPILLARY
GLUCOSE-CAPILLARY: 219 mg/dL — AB (ref 65–99)
Glucose-Capillary: 218 mg/dL — ABNORMAL HIGH (ref 65–99)
Glucose-Capillary: 545 mg/dL (ref 65–99)
Glucose-Capillary: 170 mg/dL — ABNORMAL HIGH (ref 65–99)

## 2017-12-04 LAB — MAGNESIUM: MAGNESIUM: 1.7 mg/dL (ref 1.7–2.4)

## 2017-12-04 LAB — PHOSPHORUS: PHOSPHORUS: 3.4 mg/dL (ref 2.5–4.6)

## 2017-12-04 MED ORDER — INSULIN ASPART 100 UNIT/ML ~~LOC~~ SOLN
8.0000 [IU] | Freq: Once | SUBCUTANEOUS | Status: AC
  Administered 2017-12-04: 8 [IU] via SUBCUTANEOUS

## 2017-12-04 MED ORDER — INSULIN GLARGINE 100 UNIT/ML ~~LOC~~ SOLN
18.0000 [IU] | Freq: Every day | SUBCUTANEOUS | Status: DC
  Administered 2017-12-04: 18 [IU] via SUBCUTANEOUS
  Filled 2017-12-04 (×3): qty 0.18

## 2017-12-04 MED ORDER — INSULIN ASPART 100 UNIT/ML ~~LOC~~ SOLN
3.0000 [IU] | Freq: Three times a day (TID) | SUBCUTANEOUS | Status: DC
  Administered 2017-12-04 – 2017-12-06 (×5): 3 [IU] via SUBCUTANEOUS

## 2017-12-04 NOTE — Progress Notes (Signed)
Inpatient Diabetes Program Recommendations  AACE/ADA: New Consensus Statement on Inpatient Glycemic Control (2015)  Target Ranges:  Prepandial:   less than 140 mg/dL      Peak postprandial:   less than 180 mg/dL (1-2 hours)      Critically ill patients:  140 - 180 mg/dL   Lab Results  Component Value Date   GLUCAP 218 (H) 12/04/2017   HGBA1C 4.4 (L) 11/30/2017    Review of Glycemic Control  FBS > goal of 180 mg/dL.   Inpatient Diabetes Program Recommendations:     Increase Lantus to 18 units QHS.  Continue to follow.  Thank you. Ailene Ardshonda Yossi Hinchman, RD, LDN, CDE Inpatient Diabetes Coordinator [phone]

## 2017-12-04 NOTE — Progress Notes (Signed)
Physical Therapy Treatment Patient Details Name: [name] MRN: [id number] DOB: [birth year] Today's Date: 12/04/2017    History of Present Illness Pt adm with abd swelling and lethargy and found to have acute hepatic encephalopathy. Pt intubated for airway protection  3/17-3/20. Pt also with acute kidney injury. PMH - DM, PTSD, compression fx's and pain/ debilitation, GERD, EtOH abuse with cirrhosis    PT Comments    Patient continues to progress well with mobility. Pt ambulating hallway upon PT arrival. Pt tolerated ambulating 451ft with rollator, stair training, and going up/down ramp. Overall pt is mod I for transfers and ambulation and min guard for safety on stairs. PT will continue to follow acutely and attempt to see pt after HD to assess activity tolerance. Continue to progress as tolerated.    Follow Up Recommendations  Home health PT;Supervision/Assistance - 24 hour     Equipment Recommendations  3in1 (PT)    Recommendations for Other Services OT consult     Precautions / Restrictions Precautions Precautions: Fall    Mobility  Bed Mobility               General bed mobility comments: NA  Transfers Overall transfer level: Modified independent   Transfers: Sit to/from Stand           General transfer comment: use of AD upon standing  Ambulation/Gait Ambulation/Gait assistance: Modified independent (Device/Increase time)(without physical contact) Ambulation Distance (Feet): 400 Feet Assistive device: (rollator) Gait Pattern/deviations: Step-through pattern;Decreased stride length;Trunk flexed Gait velocity: decr   General Gait Details: cues for posture; safe use of AD; pt able to safety go up/down ramp using rollator   Stairs Stairs: Yes Stairs assistance: Min guard Stair Management: One rail Right;Step to pattern;Forwards Number of Stairs: 4 General stair comments: cues for technique and safety   Wheelchair Mobility    Modified Rankin  (Stroke Patients Only)       Balance     Sitting balance-Leahy Scale: Normal       Standing balance-Leahy Scale: Fair Standing balance comment: pt able to static stand without UE support                            Cognition Arousal/Alertness: Awake/alert Behavior During Therapy: WFL for tasks assessed/performed Overall Cognitive Status: Within Functional Limits for tasks assessed                                        Exercises      General Comments        Pertinent Vitals/Pain Pain Assessment: Faces Faces Pain Scale: No hurt Pain Intervention(s): Monitored during session    Home Living                      Prior Function            PT Goals (current goals can now be found in the care plan section) Acute Rehab PT Goals PT Goal Formulation: With patient Time For Goal Achievement: 12/12/17 Potential to Achieve Goals: Good Progress towards PT goals: Progressing toward goals    Frequency    Min 2X/week      PT Plan Current plan remains appropriate    Co-evaluation              AM-PAC PT "6 Clicks" Daily Activity  Outcome Measure  Difficulty  turning over in bed (including adjusting bedclothes, sheets and blankets)?: None Difficulty moving from lying on back to sitting on the side of the bed? : A Little Difficulty sitting down on and standing up from a chair with arms (e.g., wheelchair, bedside commode, etc,.)?: A Lot Help needed moving to and from a bed to chair (including a wheelchair)?: A Little Help needed walking in hospital room?: A Little Help needed climbing 3-5 steps with a railing? : A Little 6 Click Score: 18    End of Session Equipment Utilized During Treatment: Gait belt Activity Tolerance: Patient tolerated treatment well Patient left: with call bell/phone within reach;in chair Nurse Communication: Mobility status PT Visit Diagnosis: Unsteadiness on feet (R26.81);Other abnormalities of gait  and mobility (R26.89);Muscle weakness (generalized) (M62.81)     Time: [id number] PT Time Calculation (min) (ACUTE ONLY): 36 min  Charges:  $Gait Training: 8-22 mins $Therapeutic Activity: 8-22 mins                    G Codes:       [name], PTA Pager: [phone]     [name] 12/04/2017, 4:40 PM

## 2017-12-04 NOTE — Progress Notes (Signed)
Patient ID: [name], male   DOB: March 01, 1961, 57 y.o.   MRN: [id number] [facility] Progress Note   Assessment/ Plan:   1. Anuric acute kidney injury, likely ESRD-on renal replacement therapy since 11/16/17 (CRRT transitioned to IHD).  Based on the initial workup/assessment by Dr. Flossie Dibble was determined to have ATN rather than HRS for which RRT was started (initially determined as "temporary dialysis" but time limits not set). He is not a candidate for liver transplant due to ongoing alcohol consumption at the time of admission.  Continue hemodialysis on a TTS schedule.  Appreciate input by vascular surgery as well as the palliative care team in his management.  No targets for AV fistula creation and marginal candidate for AV graft because of chronic hypotension-after discussion, elected to defer AV graft placement for at least the next few weeks to see how he does on hemodialysis. 2. Alcoholic cirrhosis with encephalopathy-not a candidate for liver transplantation referral at this time given recent alcohol use, albumin levels remain surprisingly elevated post infusions/hemodialysis.  3. Thrombocytopenia-secondary to cirrhosis/hypersplenism from portal hypertension.  Without bleeding diastasis. 4. Alcohol abuse-we educated regarding cessation from alcohol and need for follow-up with gastroenterology.  5. Disposition-appreciate input from palliative care service and I have started the process for outpatient dialysis unit placement.  Subjective:   Reports an uneventful night and trying to wrap his head around chronic dialysis.  Discussions with palliative care team from yesterday noted.   Objective:   BP (!) 93/53 (BP Location: Right Arm)   Pulse 62   Temp 98.1 F (36.7 C) (Oral)   Resp 18   Ht 5\' 6"  (1.676 m)   Wt 72.7 kg (160 lb 4.4 oz) Comment: bed scale  SpO2 95%   BMI 25.87 kg/m   Intake/Output Summary (Last 24 hours) at 12/04/2017 1051 Last data filed at 12/04/2017  1039 Gross per 24 hour  Intake 350 ml  Output 1000 ml  Net -650 ml   Weight change: 2.464 kg (5 lb 6.9 oz)  Physical Exam: Gen: Comfortably resting in recliner, icteric CVS: Pulse regular rhythm, ejection systolic murmur over apex Resp: Decreased breath sounds over bases, no rales, right IJ TDC Abd: Soft, distended, mildly tender over the lower quadrants Ext: 1-2+ bilateral lower extremity edema with dressings over shallow skin ulcers over legs  Imaging: No results found.  Labs: BMET Recent Labs  Lab 11/27/17 1725 11/28/17 4098 11/30/17 0732 12/01/17 0617 12/01/17 1825 12/02/17 0551 12/03/17 0656 12/04/17 0639  NA 141 140 138 139  --  138 140 137  K 4.4 3.9 3.7 4.0  --  3.4* 3.7 3.9  CL 107 104 103 103  --  98* 101 102  CO2 19* 22 22 22   --  24 22 24   GLUCOSE 178* 300* 351* 336* 409* 184* 145* 231*  BUN 14 12 16  23*  --  17 27* 16  CREATININE 4.69* 5.37* 5.47* 5.82*  --  4.49* 6.03* 3.98*  CALCIUM 8.9 8.9 9.3 9.5  --  9.2 9.7 8.5*  PHOS 3.6 4.0 3.0 2.8  --   --  2.7 3.4   CBC Recent Labs  Lab 12/01/17 0617 12/02/17 0551 12/03/17 0656 12/04/17 0639  WBC 6.2 8.3 7.6 6.2  NEUTROABS  --   --  3.9 3.1  HGB 9.5* 10.4* 10.8* 9.6*  HCT 30.7* 33.7* 34.1* 30.5*  MCV 116.3* 115.0* 113.3* 113.0*  PLT 87* 93* 95* 73*    Medications:    . chlorhexidine  15 mL Mouth Rinse BID  . Chlorhexidine Gluconate Cloth  6 each Topical Daily  . famotidine  20 mg Oral QHS  . feeding supplement (ENSURE ENLIVE)  237 mL Oral TID BM  . feeding supplement (PRO-STAT SUGAR FREE 64)  30 mL Oral TID  . fentaNYL  12.5 mcg Transdermal Q72H  . folic acid  1 mg Oral Daily  . insulin aspart  0-20 Units Subcutaneous TID WC  . insulin glargine  16 Units Subcutaneous QHS  . lactulose  20 g Per Tube TID   Or  . lactulose  300 mL Rectal TID  . lidocaine  1 patch Transdermal Q24H  . mouth rinse  15 mL Mouth Rinse q12n4p  . methocarbamol  500 mg Oral TID  . midodrine  10 mg Oral TID WC  .  octreotide  200 mcg Subcutaneous BID  . QUEtiapine  25 mg Oral QHS  . rifaximin  550 mg Oral BID  . sodium chloride flush  10-40 mL Intracatheter Q12H  . thiamine  100 mg Oral Daily   [name] BillsJay Gabriell Daigneault, MD 12/04/2017, 10:51 AM

## 2017-12-04 NOTE — Social Work (Addendum)
[facility].   CSW signing off. Please consult if any additional needs arise.  Doy HutchingIsabel H Dilyn Smiles, LCSWA [facility] Clinical Social Work [phone]

## 2017-12-04 NOTE — Progress Notes (Signed)
PROGRESS NOTE    [name]  ZOX:[id number] DOB: 04-14-1961 DOA: 11/07/2017 PCP: Dettinger, Elige Radon, MD  Brief Narrative:  [name] is a 57 y.o. year old male with medical history significant for alcoholic cirrhosis, T2DM, PTSD, and chronic back pain who presented on 11/07/2017 with 2 weeks of abdominal swelling, worsening jaundice and somnolence and was found to have decompensated cirrhosis prompting admission to critical care for hepatic encephalopathy,hypoglycemia related to continued insulin therapy despite decreased PO intake, presumed SBP ( no paracentesis with insufficient ascites), INR coagulopathy and and eventually intubated (3/16) for inability to protect airway.    Prolonged hospital course included intubation on 3/16 (extubated 3/20) due to hypoxia and somnolence, empiric SBP therapy ( not enough asites to tap)recurrent hypoglycemia requiring IV dextrose, severe cellulitis of lower extremities treated with vancomycin during time in critical care, anuric AKI/ATN prompted discontinuation of Vancomyin( 3/20), empiric IV albumin/midodrine/octreotide treatment (3/23-4/8), CRRT and eventually transition to intermittent HD( 4/2). He was transferred to [facility] service on 11/23/17 for further management and has been undergoing intermittent HD. Palliative is on board as patient has poor prognosis and unclear if he can tolerate HD long term. VVS evaluated and may get an AVG on Friday however after Nephrology and Vascular discussed case will hold on AVG at this time and will continue Dialysis through [facility] and process for outpatient Dialysis placement started per Nephro.  Assessment & Plan:   Active Problems:   Hypoxia   Acute respiratory failure with hypoxia (HCC)   Cirrhosis (HCC)   Acute renal failure (ARF) (HCC)   Goals of care, counseling/discussion   Palliative care by specialist   ESRD (end stage renal disease) on dialysis (HCC)   Thrombocytopenia (HCC)   Alcohol abuse   Pain,  generalized  Anuric Acute Renal Failure now on Dialysis TTS - Presumed hemodynamically mediated/ ischemic ATN ( hypotension from SBP, ?some element of vancomycin toxicity which he was on for severe cellulitis for his lower extremities, some prior concern for HRS and had trial of albumin, midodrine, octreotride 3/23-4/8) -CRRT 3/26-3/29, s/p tunneled cath placement on 4/5, - -Patient is Negative 14.423 L this admission -His BP has been tolerating intermittent HD this past week -Midodrine (less for HRS and more helpful in BP on iHD), octreotide, d/c'd albumin ( by nephro) -Poor prognosis given unclear patient can tolerate long term HD with his BP, palliative medicine following -12.5 mcg fentanyl patch to assist w/ back pain most prominent after HD sessions. We do NOT plan on discharging patient home on a fentanyl patch -Palliative involved for GOC; At this point patient wants to continue with Treatment after discussion with wife -Nephrology consulted and planning on continuing Dialysis -Vein Mapping done and he has an SVT in the Right Basilic Vein and Left Basilic Vein  -Vascular Surgery Consulted and only option is to do a a Left Arm AVG -If AVG will not be pursued at this time -Appreciate Nephrology Recc's and they have started search for outpatient Dialysis centers.   Decompensated Alcoholic cirrhosis with hepatic encephalopathy, stable -Normal mentation and continues to remain w/o asterixis -Not a Candidate for a Liver Transplant.  -Continue Lactulose po 20-g TID or Lactulose Enema 300 mL RC TID and Rifaximin 550 mg po BID regimen.    Hyperbilirubinemia and Elevated AST -Likely related to Cirrhosis; T Bili now 6.6 and AST now 78 -Hepatitis panel on admission negative  -Continue to Monitor CMP in AM   Thrombocytopenia -Improving -Secondary to cirrhosis/hypersplenism -Platelet Count went  from 87 -> 93 -> 95 -> 73 -Asymptomatic -Continue to Monitor for S/Sx of Bleeding and repeat CBC  in AM   Anemia, likely of chronic disease given ESRD and chronic liver disease -Hb/Hct went from 10.8/34.1 -> 9.6/30.5 -Check Anemia Panel in AM -Continue to Monitor for S/Sx of Bleeding -Repeat CBC in AM   Type 2 DM - Hemoglobin A1C 4.4 (11/2017), -On admission presented with hypoglycemia requiring IV dextrose as patient was still taking insulin despite no PO intake with Hepatic Encephalopathy - A1c 4.4%, likely hypersplenism? (portal hypertension associated with cirrhosis), no blood transfusions here, additionally 50 lb weight loss in 2 months maybe  -Elevated fasting glucose > 300 for several days -Less concern for hypoglycemia given patient is eating well -Increased Lantus to 18 units sq qHS, and increased Novolog Moderate Scale AC to Resistant SSI -CBG's ranging from 218-545 -Diabetes Education Coordinator Consulted for evaluation  Alcohol Abuse - Discussed importance of cessation with family and patient.  -C/w Folic Acid 1 mg po Daily and with Thiamine 100 mg po Daily  -C/w Quetiapine 25 mg po qHS  -Palliative Discussed AA Meetings with the Patient   Acute Hypoxic Respiratory Failure, resolved -S/p 2 intubations during hospitalization likely related to inability to protect airway in setting of decompensated cirrhosis/hepatic encephalopathy and metabolic derangements from worsening renal failure earlier in admission. -Continue to encourage incentive spirometry and monitor Respiratory Status carefully   Acute on Chronic Back pain -Flare worsened by recent HD session, continue oxycodone PRN q4h severe pain, and robaxin.   -C/w 12.5 mcg Fentanyl patch q72h added this admission, will closely monitor to ensure does not negatively affect blood pressure. -C/w Lidocaine Patch TD q24h as well  -Palliative to find out about outpatient   DVT prophylaxis: SCDs Code Status: Partial Code Family Communication: No family at bedside  Disposition Plan: Remain Inpatient and D/C will go home  with Home Health PT with [facility] and 3in1 when Dialysis Slot Available (per discussion with Dr. Allena KatzPatel may be Next week)  Consultants:   Nephrology  Palliative Care Medicine  Vascular Surgery    Procedures:  VEIN MAPPING Upper Extremity Vein Map  Right Cephalic: Not visualized  Right Basilic  Segment Diameter Depth Comment  1. Axilla   Not visualized  2. Mid upper arm 2.4643mm 19.249mm   3. Above [facility] 1.595mm 17.5810mm   4. In [facility] 2.2219mm 11.2750mm Thrombosed  5. Below AC 1.4083mm 18.295mm Thrombosed  6. Mid forearm 1.6392mm 16.6810mm Thrombosed  7. Wrist   Not visualized                  Left Cephalic  Segment Diameter Depth Comment  1. Axilla 1.2028mm 10.745mm   2. Mid upper arm 0.6056mm 3.6619mm   3. [facility]   Not visualized  4. In [facility]   Not visualized  5. Below [facility]   Not visualized  6. Mid forearm   Not visualized  7. Wrist   Not visualized                  Left Basilic  Segment Diameter Depth Comment  1. Axilla 2.8755mm 16.627mm Thrombosed  2. Mid upper arm 2.2674mm 12.2960mm Thrombosed  3. Above [facility] 2.6849mm 12.4620mm Thrombosed  4. In AC mm mm Not visualized  5. Below AC mm mm Not visualized  6. Mid forearm mm mm Not visualized  7. Wrist mm mm Not visualized   mm mm    mm mm    mm mm  Antimicrobials:  Anti-infectives (From admission, onward)   Start     Dose/Rate Route Frequency Ordered Stop   11/27/17 0800  ceFAZolin (ANCEF) IVPB 2g/100 mL premix     2 g 200 mL/hr over 30 Minutes Intravenous To Radiology 11/27/17 0747 11/27/17 1455   11/20/17 2200  rifaximin (XIFAXAN) tablet 550 mg     550 mg Oral 2 times daily 11/20/17 1004     11/17/17 1000  rifaximin (XIFAXAN) tablet 550 mg  Status:  Discontinued     550 mg Per Tube 2 times daily 11/17/17 0844 11/20/17 1004   11/16/17 2200  piperacillin-tazobactam (ZOSYN) IVPB 3.375 g  Status:  Discontinued     3.375 g 100 mL/hr over 30 Minutes Intravenous Every 6 hours 11/16/17 1902 11/19/17  1006   11/15/17 1400  piperacillin-tazobactam (ZOSYN) IVPB 3.375 g  Status:  Discontinued     3.375 g 12.5 mL/hr over 240 Minutes Intravenous Every 12 hours 11/15/17 1327 11/16/17 1902   11/11/17 2200  rifaximin (XIFAXAN) tablet 550 mg  Status:  Discontinued     550 mg Oral 2 times daily 11/11/17 1611 11/17/17 0844   11/08/17 2200  vancomycin (VANCOCIN) 1,500 mg in sodium chloride 0.9 % 500 mL IVPB  Status:  Discontinued     1,500 mg 250 mL/hr over 120 Minutes Intravenous Every 12 hours 11/08/17 1140 11/09/17 1210   11/08/17 0900  vancomycin (VANCOCIN) 2,000 mg in sodium chloride 0.9 % 500 mL IVPB     2,000 mg 250 mL/hr over 120 Minutes Intravenous  Once 11/08/17 0858 11/08/17 1200   11/07/17 1800  cefTRIAXone (ROCEPHIN) 2 g in sodium chloride 0.9 % 100 mL IVPB     2 g 200 mL/hr over 30 Minutes Intravenous Every 24 hours 11/07/17 1753 11/14/17 1759     Subjective: Seen and examined and still had pain. Stated he felt better than he had previously after dialysis sessions. No CP or SOB. Has significant neuropathy in Right Leg.   Objective: Vitals:   12/03/17 1730 12/03/17 2117 12/04/17 0553 12/04/17 0600  BP: 106/60 95/60 (!) 93/53   Pulse: (!) 59 81 62   Resp: (!) 59 17 18   Temp: 98.2 F (36.8 C) 98.8 F (37.1 C) 98.1 F (36.7 C)   TempSrc: Oral Oral Oral   SpO2: 95% 98% 95%   Weight:    72.7 kg (160 lb 4.4 oz)  Height:        Intake/Output Summary (Last 24 hours) at 12/04/2017 1955 Last data filed at 12/04/2017 1800 Gross per 24 hour  Intake 470 ml  Output 0 ml  Net 470 ml   Filed Weights   12/03/17 0500 12/03/17 1300 12/04/17 0600  Weight: 69.5 kg (153 lb 4.8 oz) 72 kg (158 lb 11.7 oz) 72.7 kg (160 lb 4.4 oz)   Examination: Physical Exam:  Constitutional: WN/WD Jaundiced Caucasian male in NAD appears calm.  Eyes: Sclerae anicteric. Lids normal ENMT: External Ears and Nose appear normal MMM Neck: Supple with no JVD Respiratory: Diminished to auscultation  bilaterally; No appreciable wheezing/rales/rhonchi. Unlabored breathing  Cardiovascular: RRR; No m/r/g. Has trace LE edema Abdomen: Soft, Distended due to Body habitus and ascites. Bowel Sounds present GU: Deferred Musculoskeletal: No contractures; No cyanosis Skin: Warm and Dry. Has lower extremity venous stasis changes. No rashes but is Jaundiced and has Mepilex pads on legs Neurologic: CN 2-12 grossly intact Psychiatric: Normal mood and affect. Intact judgement and insight  Data Reviewed: I have personally reviewed following labs  and imaging studies  CBC: Recent Labs  Lab 11/30/17 0732 12/01/17 0617 12/02/17 0551 12/03/17 0656 12/04/17 0639  WBC 6.8 6.2 8.3 7.6 6.2  NEUTROABS  --   --   --  3.9 3.1  HGB 9.1* 9.5* 10.4* 10.8* 9.6*  HCT 31.1* 30.7* 33.7* 34.1* 30.5*  MCV 117.4* 116.3* 115.0* 113.3* 113.0*  PLT 86* 87* 93* 95* 73*   Basic Metabolic Panel: Recent Labs  Lab 11/28/17 0822 11/30/17 0732 12/01/17 0617 12/01/17 1825 12/02/17 0551 12/03/17 0656 12/04/17 0639  NA 140 138 139  --  138 140 137  K 3.9 3.7 4.0  --  3.4* 3.7 3.9  CL 104 103 103  --  98* 101 102  CO2 22 22 22   --  24 22 24   GLUCOSE 300* 351* 336* 409* 184* 145* 231*  BUN 12 16 23*  --  17 27* 16  CREATININE 5.37* 5.47* 5.82*  --  4.49* 6.03* 3.98*  CALCIUM 8.9 9.3 9.5  --  9.2 9.7 8.5*  MG  --   --   --   --   --  2.0 1.7  PHOS 4.0 3.0 2.8  --   --  2.7 3.4   GFR: Estimated Creatinine Clearance: 18.9 mL/min (A) (by C-G formula based on SCr of 3.98 mg/dL (H)). Liver Function Tests: Recent Labs  Lab 11/30/17 0732 12/01/17 0617 12/02/17 0551 12/03/17 0656 12/04/17 0639  AST 56*  --  74* 84* 78*  ALT 14*  --  13* 14* 10*  ALKPHOS 103  --  135* 144* 132*  BILITOT 7.9*  --  8.1* 8.0* 6.6*  PROT 7.1  --  7.4 7.9 7.2  ALBUMIN 4.0 4.1 4.1 4.4 3.7   No results for input(s): LIPASE, AMYLASE in the last 168 hours. No results for input(s): AMMONIA in the last 168 hours. Coagulation Profile: No  results for input(s): INR, PROTIME in the last 168 hours. Cardiac Enzymes: No results for input(s): CKTOTAL, CKMB, CKMBINDEX, TROPONINI in the last 168 hours. BNP (last 3 results) No results for input(s): PROBNP in the last 8760 hours. HbA1C: No results for input(s): HGBA1C in the last 72 hours. CBG: Recent Labs  Lab 12/03/17 1941 12/03/17 2117 12/04/17 0807 12/04/17 1207 12/04/17 1747  GLUCAP 115* 141* 218* 545* 219*   Lipid Profile: No results for input(s): CHOL, HDL, LDLCALC, TRIG, CHOLHDL, LDLDIRECT in the last 72 hours. Thyroid Function Tests: No results for input(s): TSH, T4TOTAL, FREET4, T3FREE, THYROIDAB in the last 72 hours. Anemia Panel: No results for input(s): VITAMINB12, FOLATE, FERRITIN, TIBC, IRON, RETICCTPCT in the last 72 hours. Sepsis Labs: No results for input(s): PROCALCITON, LATICACIDVEN in the last 168 hours.  No results found for this or any previous visit (from the past 240 hour(s)).   Radiology Studies: No results found.  Scheduled Meds: . chlorhexidine  15 mL Mouth Rinse BID  . Chlorhexidine Gluconate Cloth  6 each Topical Daily  . famotidine  20 mg Oral QHS  . feeding supplement (ENSURE ENLIVE)  237 mL Oral TID BM  . feeding supplement (PRO-STAT SUGAR FREE 64)  30 mL Oral TID  . fentaNYL  12.5 mcg Transdermal Q72H  . folic acid  1 mg Oral Daily  . insulin aspart  0-20 Units Subcutaneous TID WC  . insulin aspart  3 Units Subcutaneous TID WC  . insulin glargine  18 Units Subcutaneous QHS  . lactulose  20 g Per Tube TID   Or  . lactulose  300  mL Rectal TID  . lidocaine  1 patch Transdermal Q24H  . mouth rinse  15 mL Mouth Rinse q12n4p  . methocarbamol  500 mg Oral TID  . midodrine  10 mg Oral TID WC  . octreotide  200 mcg Subcutaneous BID  . QUEtiapine  25 mg Oral QHS  . rifaximin  550 mg Oral BID  . sodium chloride flush  10-40 mL Intracatheter Q12H  . thiamine  100 mg Oral Daily   Continuous Infusions: . sodium chloride Stopped  (11/20/17 0900)    LOS: 27 days   Merlene Laughter, DO [facility] Pager [phone]  If 7PM-7AM, please contact night-coverage [email or web address] Password [email or web address] 12/04/2017, 7:55 PM

## 2017-12-04 NOTE — Care Management Note (Signed)
Case Management Note  Patient Details  Name: [name] MRN: [id number] Date of Birth: 09/18/1960  Subjective/Objective:                    Action/Plan:  Discussed discharge planning with patient at bedside. Current PT recommendations are HHPT , Rolling walker vs Rollator , wheelchair . Patient voiced understanding.   Explained for SNF , PT notes and clinical information would be submitted to insurance for authorization.   Patient would like to discuss recommendations with PT .  PT to see patient again today . Will follow up later.  Expected Discharge Date:                  Expected Discharge Plan:  Home w Home Health Services  In-House Referral:     Discharge planning Services  CM Consult  Post Acute Care Choice:  Durable Medical Equipment, Home Health Choice offered to:  Patient  DME Arranged:  Community education officerLightweight manual wheelchair with seat cushion DME Agency:  [facility]  [facility]  [facility]:     Status of Service:  In process, will continue to follow  If discussed at Long Length of Stay Meetings, dates discussed:    Additional Comments:  Kingsley PlanWile, Chibuikem Thang Marie, RN 12/04/2017, 1:24 PM

## 2017-12-04 NOTE — Progress Notes (Addendum)
Daily Progress Note   Patient Name: [name]       Date: 12/04/2017 DOB: 1961/05/05  Age: 57 y.o. MRN#: [id number] Attending Physician: Merlene Laughter, DO Primary Care Physician: Dettinger, Elige Radon, MD Admit Date: 11/07/2017  Reason for Consultation/Follow-up: Establishing goals of care, Pain control and Psychosocial/spiritual support  Subjective: Patient seen, chart reviewed.  He is sitting up in a recliner, alert and oriented x3.  He has been walking in the hall. MAR reviewed and see minimal as needed doses of oxycodone utilized since fentanyl transdermal started at 12.5 mcg every 72 hours.  He is still verbalizing pain scale of 8 out of 10 and would like to have a goal of 5-6 of 10.  He continues to show insight into his alcohol use disorder as well as the need to minimize opioids.  Patient has had periods of sobriety of a year or more in the past.  He denies any cravings but is verbalizing anxiety.  He is still verbalizing wanting to go forward with hemodialysis as well as continued support, treatment, for his liver failure.  Patient has chronic pain and this is been managed through the [place].  They have been filling his prescriptions for opioids for several years now  Length of Stay: 27  Current Medications: Scheduled Meds:  . chlorhexidine  15 mL Mouth Rinse BID  . Chlorhexidine Gluconate Cloth  6 each Topical Daily  . famotidine  20 mg Oral QHS  . feeding supplement (ENSURE ENLIVE)  237 mL Oral TID BM  . feeding supplement (PRO-STAT SUGAR FREE 64)  30 mL Oral TID  . fentaNYL  12.5 mcg Transdermal Q72H  . folic acid  1 mg Oral Daily  . insulin aspart  0-20 Units Subcutaneous TID WC  . insulin aspart  3 Units Subcutaneous TID WC  . insulin glargine  18 Units Subcutaneous  QHS  . lactulose  20 g Per Tube TID   Or  . lactulose  300 mL Rectal TID  . lidocaine  1 patch Transdermal Q24H  . mouth rinse  15 mL Mouth Rinse q12n4p  . methocarbamol  500 mg Oral TID  . midodrine  10 mg Oral TID WC  . octreotide  200 mcg Subcutaneous BID  . QUEtiapine  25 mg Oral QHS  . rifaximin  550 mg Oral BID  . sodium chloride flush  10-40 mL Intracatheter Q12H  . thiamine  100 mg Oral Daily    Continuous Infusions: . sodium chloride Stopped (11/20/17 0900)    PRN Meds: sodium chloride, albuterol, calcium carbonate (dosed in mg elemental calcium), heparin, oxyCODONE  Physical Exam  Constitutional: He is oriented to person, place, and time. He appears well-developed and well-nourished.  Older man seen sitting in recliner; no acute distress  HENT:  Head: Normocephalic and atraumatic.  Neck: Normal range of motion.  Cardiovascular: Normal rate.  Pulmonary/Chest: Effort normal.  Abdominal: Soft.  Musculoskeletal: Normal range of motion.  Neurological: He is alert and oriented to person, place, and time.  Skin: Skin is warm and dry.  Psychiatric: He has a normal mood and affect.  verbalizing anxiety  Nursing note and vitals reviewed.           Vital Signs: BP (!) 93/53 (BP Location: Right Arm)   Pulse 62   Temp 98.1 F (36.7 C) (Oral)   Resp 18   Ht 5\' 6"  (1.676 m)   Wt 72.7 kg (160 lb 4.4 oz) Comment: bed scale  SpO2 95%   BMI 25.87 kg/m  SpO2: SpO2: 95 % O2 Device: O2 Device: Room Air O2 Flow Rate: O2 Flow Rate (L/min): 2 L/min  Intake/output summary:   Intake/Output Summary (Last 24 hours) at 12/04/2017 1652 Last data filed at 12/04/2017 1500 Gross per 24 hour  Intake 350 ml  Output 1000 ml  Net -650 ml   LBM: Last BM Date: 12/03/17 Baseline Weight: Weight: 88.9 kg (195 lb 15.8 oz) Most recent weight: Weight: 72.7 kg (160 lb 4.4 oz)(bed scale)       Palliative Assessment/Data:    Flowsheet Rows     Most Recent Value  Intake Tab  Referral  Department  Hospitalist  Unit at Time of Referral  ICU  Palliative Care Primary Diagnosis  Other (Comment)  Date Notified  11/13/17  Palliative Care Type  New Palliative care  Date of Admission  11/07/17  Date first seen by Palliative Care  11/15/17  # of days Palliative referral response time  2 Day(s)  # of days IP prior to Palliative referral  6  Clinical Assessment  Palliative Performance Scale Score  40%  Pain Max last 24 hours  Not able to report  Pain Min Last 24 hours  Not able to report  Dyspnea Max Last 24 Hours  Not able to report  Dyspnea Min Last 24 hours  Not able to report  Nausea Max Last 24 Hours  Not able to report  Nausea Min Last 24 Hours  Not able to report  Anxiety Max Last 24 Hours  Not able to report  Anxiety Min Last 24 Hours  Not able to report  Other Max Last 24 Hours  Not able to report  Psychosocial & Spiritual Assessment  Palliative Care Outcomes  Patient/Family meeting held?  Yes  Who was at the meeting?  pt and wife  Palliative Care follow-up planned  Yes, Facility      Patient Active Problem List   Diagnosis Date Noted  . Pain, generalized   . Thrombocytopenia (HCC) 11/25/2017  . Alcohol abuse 11/25/2017  . ESRD (end stage renal disease) (HCC)   . Goals of care, counseling/discussion   . Palliative care by specialist   . Acute renal failure (ARF) (HCC)   . Cirrhosis (HCC) 11/07/2017  . Influenza A 10/16/2017  . Closed fracture  of second lumbar vertebra (HCC)   . Compression fracture of L3 lumbar vertebra with delayed healing 07/16/2015  . Compression fracture of lumbar vertebra (HCC) 07/16/2015  . Pressure ulcer 01/18/2015  . Acute respiratory failure with hypoxia (HCC)   . Hypoxia   . Lumbar foraminal stenosis 04/19/2013  . Acetabulum fracture, right (HCC) 04/09/2013  . Type 2 diabetes mellitus (HCC) 04/09/2013  . CIRRHOSIS, ALCOHOLIC 06/12/2010  . LIVER FUNCTION TESTS, ABNORMAL, HX OF 10/02/2009  . PANCREATITIS, HX OF 09/27/2009  .  HYPOGONADISM 08/29/2009  . VITAMIN D DEFICIENCY 08/29/2009  . OSTEOPOROSIS 02/22/2009  . Hypertension associated with diabetes (HCC) 02/08/2009  . GERD 02/08/2009  . BACK PAIN, LUMBAR, CHRONIC 02/08/2009  . LEG EDEMA, LEFT 02/08/2009  . Hepatomegaly 02/08/2009    Palliative Care Assessment & Plan   Patient Profile: 57 y.o.malewith past medical history of DM, PTSD, chronic pain 2/2 comp FX's, sciatica, ETOH use D/Oadmitted on 3/16/2019with abdominal pain, increased LE edema, hypoglycemia. Pt was intubated for airway protection. He now has worsening renal function, possibly hepatorenal syndrome.He is now extubated. He is now close to being oliguric. His hospital coarse has been complicated not only by worsening renal function but delirium requiring restraints. He is now out of restraints and more lucid.   Pt now out of ICU, alert x3, and undergoing HD.  Overall, he is doing fairly well with hemodialysis and his pain has improved on fentanyl transdermal 12.5 mcg every 72 hours with minimal breakthrough doses noted Consult ordered for continued GOC, psychosocial support as well as pain mgt    Assessment: Discussed with patient importance of sobriety as well as rehab going forward.  See that physical therapy is recommending home with home health.  Patient is agreeable to go to a skilled nursing facility for rehab if that is a better option for him. We discussed AA, 12-step programs; I did give patient step 1 worksheet Also introduced mindfulness meditation for management of anxiety with  beginning mindfulness meditation exercise worksheet Gave patient a schedule of meetings in his area as well as a computer meeting called Staying [employer]  Recommendations/Plan:  His wife to bring patient's prescriber information who is managing his pain medicines from the [place].  I hope that they are willing to continue to manage his chronic pain, specifically ongoing  prescriptions of fentanyl transdermal  with oxycodone for breakthrough.  Palliative medicine to [facility] to verify that they will continue to manage his chronic pain.  He is in the process of obtaining a primary care provider through the [place] at the [place] location which will be much more convenient for him as opposed to going to [facility] and aid in compliance  It sounds as though patient is ambulating well, I did share with him that there is an AA meeting Sunday mornings at 10am in the hospital in the [facility] and that we could arrange for him to attend that meeting  Goals of Care and Additional Recommendations:  Limitations on Scope of Treatment: No Tracheostomy  Code Status:    Code Status Orders  (From admission, onward)        Start     Ordered   11/20/17 1720  Limited resuscitation (code)  Continuous    Comments:  Pressors ok  Question Answer Comment  In the event of cardiac or respiratory ARREST: Initiate Code Blue, Call Rapid Response Yes   In the event of cardiac or respiratory ARREST: Perform CPR No  In the event of cardiac or respiratory ARREST: Perform Intubation/Mechanical Ventilation No   In the event of cardiac or respiratory ARREST: Use NIPPV/BiPAp only if indicated Yes   In the event of cardiac or respiratory ARREST: Administer ACLS medications if indicated No   In the event of cardiac or respiratory ARREST: Perform Defibrillation or Cardioversion if indicated No      11/20/17 1722    Code Status History    Date Active Date Inactive Code Status Order ID Comments User Context   11/19/2017 1018 11/20/2017 1722 Partial Code [id number] Pressors ok Kalman Shan, MD Inpatient   11/12/2017 2159 11/19/2017 1018 Full Code [id number]  Leslye Peer, MD Inpatient   11/12/2017 2159 11/12/2017 2159 Partial Code [id number]  Leslye Peer, MD Inpatient   11/07/2017 1704 11/12/2017 2159 Full Code [id number]  Minor, Vilinda Blanks, NP ED   10/16/2017 1647 10/18/2017 1631 Full Code [id number]   Isaiah Blakes, PA-C ED   11/29/2016 0118 12/01/2016 1956 Full Code [id number]  Hillary Bow, DO ED   07/16/2015 1144 07/16/2015 2152 Full Code [id number]  Coletta Memos, MD Inpatient   01/11/2015 0007 01/25/2015 1815 Full Code [id number]  Kathlene Cote, PA-C ED   04/09/2013 2008 04/19/2013 2001 Full Code [id number]  Drema Dallas, MD Inpatient       Prognosis:   Unable to determine  Discharge Planning:  To Be Determined    Thank you for allowing the Palliative Medicine Team to assist in the care of this patient.   Time In: 1230 Time Out: 1305 Total Time 35 min Prolonged Time Billed  no       Greater than 50%  of this time was spent counseling and coordinating care related to the above assessment and plan.  Irean Hong, NP  Please contact Palliative Medicine Team phone at [phone] for questions and concerns.

## 2017-12-04 NOTE — Progress Notes (Signed)
    Durable Medical Equipment  (From admission, onward)        Start     Ordered   12/04/17 1311  For home use only DME lightweight manual wheelchair with seat cushion  Once    Comments:  Patient suffers from Acute on Chronic Back pain, ESRD which impairs their ability to perform daily activities like ambulating  in the home.  A cane will not resolve  issue with performing activities of daily living. A wheelchair will allow patient to safely perform daily activities. Patient is not able to propel themselves in the home using a standard weight wheelchair due to weakness . Patient can self propel in the lightweight wheelchair.  Accessories: elevating leg rests (ELRs), wheel locks, extensions and anti-tippers.   12/04/17 1316    Ina HomesJaclyn Demario Faniel, PT, DPT Acute Rehab Services  Pager: [phone]

## 2017-12-05 LAB — COMPREHENSIVE METABOLIC PANEL
ALBUMIN: 4.2 g/dL (ref 3.5–5.0)
ALK PHOS: 157 U/L — AB (ref 38–126)
ALT: 16 U/L — AB (ref 17–63)
AST: 80 U/L — AB (ref 15–41)
Anion gap: 14 (ref 5–15)
BILIRUBIN TOTAL: 7.6 mg/dL — AB (ref 0.3–1.2)
BUN: 26 mg/dL — AB (ref 6–20)
CO2: 21 mmol/L — ABNORMAL LOW (ref 22–32)
CREATININE: 5.23 mg/dL — AB (ref 0.61–1.24)
Calcium: 9.7 mg/dL (ref 8.9–10.3)
Chloride: 104 mmol/L (ref 101–111)
GFR calc Af Amer: 13 mL/min — ABNORMAL LOW (ref >60)
GFR calc non Af Amer: 11 mL/min — ABNORMAL LOW (ref >60)
GLUCOSE: 126 mg/dL — AB (ref 65–99)
POTASSIUM: 3.9 mmol/L (ref 3.5–5.1)
Sodium: 139 mmol/L (ref 135–145)
Total Protein: 8.1 g/dL (ref 6.5–8.1)

## 2017-12-05 LAB — CBC WITH DIFFERENTIAL/PLATELET
Basophils Absolute: 0.1 10*3/uL (ref 0.0–0.1)
Basophils Relative: 1 %
EOS PCT: 4 %
Eosinophils Absolute: 0.3 10*3/uL (ref 0.0–0.7)
HEMATOCRIT: 35.8 % — AB (ref 39.0–52.0)
HEMOGLOBIN: 10.9 g/dL — AB (ref 13.0–17.0)
LYMPHS PCT: 31 %
Lymphs Abs: 2.5 10*3/uL (ref 0.7–4.0)
MCH: 34.3 pg — ABNORMAL HIGH (ref 26.0–34.0)
MCHC: 30.4 g/dL (ref 30.0–36.0)
MCV: 112.6 fL — ABNORMAL HIGH (ref 78.0–100.0)
MONOS PCT: 11 %
Monocytes Absolute: 0.9 10*3/uL (ref 0.1–1.0)
Neutro Abs: 4.2 10*3/uL (ref 1.7–7.7)
Neutrophils Relative %: 53 %
Platelets: 92 10*3/uL — ABNORMAL LOW (ref 150–400)
RBC: 3.18 MIL/uL — AB (ref 4.22–5.81)
RDW: 14.5 % (ref 11.5–15.5)
WBC: 8 10*3/uL (ref 4.0–10.5)

## 2017-12-05 LAB — GLUCOSE, CAPILLARY
GLUCOSE-CAPILLARY: 253 mg/dL — AB (ref 65–99)
GLUCOSE-CAPILLARY: 137 mg/dL — AB (ref 65–99)
Glucose-Capillary: 111 mg/dL — ABNORMAL HIGH (ref 65–99)

## 2017-12-05 LAB — MAGNESIUM: Magnesium: 1.9 mg/dL (ref 1.7–2.4)

## 2017-12-05 LAB — PHOSPHORUS: Phosphorus: 3.2 mg/dL (ref 2.5–4.6)

## 2017-12-05 MED ORDER — MIDODRINE HCL 5 MG PO TABS
ORAL_TABLET | ORAL | Status: AC
  Administered 2017-12-05: 10 mg via ORAL
  Filled 2017-12-05: qty 2

## 2017-12-05 MED ORDER — HEPARIN SODIUM (PORCINE) 1000 UNIT/ML DIALYSIS: 40.0000 [IU]/kg | INTRAMUSCULAR | Status: DC | PRN

## 2017-12-05 MED ORDER — OXYCODONE HCL 5 MG PO TABS
ORAL_TABLET | ORAL | Status: AC
  Filled 2017-12-05: qty 2

## 2017-12-05 MED ORDER — MIDODRINE HCL 5 MG PO TABS
ORAL_TABLET | ORAL | Status: AC
  Filled 2017-12-05: qty 2

## 2017-12-05 NOTE — Progress Notes (Signed)
Patient ID: [name], male   DOB: 12-Mar-1961, 57 y.o.   MRN: [id number] [facility] Progress Note   Assessment/ Plan:   1. Anuric acute kidney injury, likely ESRD-on renal replacement therapy since 11/16/17 (CRRT transitioned to IHD) and process initiated for outpatient dialysis unit placement.  Based on the initial workup/assessment by Dr. Flossie Dibble was determined to have ATN rather than HRS for which RRT was started (initially determined as "temporary dialysis" but time limits not set). He is not a candidate for liver transplant due to ongoing alcohol consumption at the time of admission.  Seen by vascular surgery and he does not have any targets for AV fistula creation-soft blood pressures increasing apprehension for AVG creation at this time especially with questions regarding his prognosis/longevity on dialysis. 2. Alcoholic cirrhosis with encephalopathy-not a candidate for liver transplantation referral at this time given recent alcohol use, albumin levels remain surprisingly elevated post infusions/hemodialysis.  3. Thrombocytopenia-secondary to cirrhosis/hypersplenism from portal hypertension.  Without bleeding diastasis. 4. Alcohol abuse-we educated regarding cessation from alcohol and need for follow-up with gastroenterology.  5. Disposition-appreciate input from palliative care service and the process for outpatient dialysis unit placement is underway.  Subjective:   Denies acute events overnight, denies any chest pain or shortness of breath.   Objective:   BP (!) 82/55   Pulse 79   Temp 98.1 F (36.7 C) (Oral)   Resp 18   Ht 5\' 6"  (1.676 m)   Wt 71.5 kg (157 lb 10.1 oz)   SpO2 98%   BMI 25.44 kg/m   Intake/Output Summary (Last 24 hours) at 12/05/2017 0925 Last data filed at 12/04/2017 2300 Gross per 24 hour  Intake 470 ml  Output 0 ml  Net 470 ml   Weight change: -0.5 kg (-1 lb 1.6 oz)  Physical Exam: Gen: Comfortably resting in recliner on  hemodialysis CVS: Pulse regular rhythm, ejection systolic murmur over apex Resp: Decreased breath sounds over bases, no rales, right IJ TDC Abd: Soft, distended, nontender Ext: 1+ bilateral lower extremity edema with dressings over shallow skin ulcers over left pretibial surface  Imaging: No results found.  Labs: BMET Recent Labs  Lab 11/30/17 0732 12/01/17 0617 12/01/17 1825 12/02/17 0551 12/03/17 0656 12/04/17 0639 12/05/17 0633  NA 138 139  --  138 140 137 139  K 3.7 4.0  --  3.4* 3.7 3.9 3.9  CL 103 103  --  98* 101 102 104  CO2 22 22  --  24 22 24  21*  GLUCOSE 351* 336* 409* 184* 145* 231* 126*  BUN 16 23*  --  17 27* 16 26*  CREATININE 5.47* 5.82*  --  4.49* 6.03* 3.98* 5.23*  CALCIUM 9.3 9.5  --  9.2 9.7 8.5* 9.7  PHOS 3.0 2.8  --   --  2.7 3.4 3.2   CBC Recent Labs  Lab 12/02/17 0551 12/03/17 0656 12/04/17 0639 12/05/17 0633  WBC 8.3 7.6 6.2 8.0  NEUTROABS  --  3.9 3.1 PENDING  HGB 10.4* 10.8* 9.6* 10.9*  HCT 33.7* 34.1* 30.5* 35.8*  MCV 115.0* 113.3* 113.0* 112.6*  PLT 93* 95* 73* 92*    Medications:    . chlorhexidine  15 mL Mouth Rinse BID  . Chlorhexidine Gluconate Cloth  6 each Topical Daily  . famotidine  20 mg Oral QHS  . feeding supplement (ENSURE ENLIVE)  237 mL Oral TID BM  . feeding supplement (PRO-STAT SUGAR FREE 64)  30 mL Oral TID  .  fentaNYL  12.5 mcg Transdermal Q72H  . folic acid  1 mg Oral Daily  . insulin aspart  0-20 Units Subcutaneous TID WC  . insulin aspart  3 Units Subcutaneous TID WC  . insulin glargine  18 Units Subcutaneous QHS  . lactulose  20 g Per Tube TID   Or  . lactulose  300 mL Rectal TID  . lidocaine  1 patch Transdermal Q24H  . mouth rinse  15 mL Mouth Rinse q12n4p  . methocarbamol  500 mg Oral TID  . midodrine      . midodrine  10 mg Oral TID WC  . octreotide  200 mcg Subcutaneous BID  . oxyCODONE      . QUEtiapine  25 mg Oral QHS  . rifaximin  550 mg Oral BID  . sodium chloride flush  10-40 mL  Intracatheter Q12H  . thiamine  100 mg Oral Daily   Zetta BillsJay Mylah Baynes, MD 12/05/2017, 9:25 AM

## 2017-12-05 NOTE — Procedures (Signed)
Patient seen on Hemodialysis. QB 400, UF goal 1.5L Treatment adjusted as needed.  Zetta BillsJay Embry Manrique MD [facility]. Office # [phone] Pager # [phone] 9:31 AM

## 2017-12-05 NOTE — Progress Notes (Signed)
Received pt from dialysis. Pt sitting in recliner. Will continue to monitor.

## 2017-12-05 NOTE — Progress Notes (Addendum)
PROGRESS NOTE    [name]  UEA:[id number] DOB: September 13, 1960 DOA: 11/07/2017 PCP: Dettinger, Elige Radon, MD  Brief Narrative:  [name] is a 57 y.o. year old male with medical history significant for alcoholic cirrhosis, T2DM, PTSD, and chronic back pain who presented on 11/07/2017 with 2 weeks of abdominal swelling, worsening jaundice and somnolence and was found to have decompensated cirrhosis prompting admission to critical care for hepatic encephalopathy,hypoglycemia related to continued insulin therapy despite decreased PO intake, presumed SBP ( no paracentesis with insufficient ascites), INR coagulopathy and and eventually intubated (3/16) for inability to protect airway.    Prolonged hospital course included intubation on 3/16 (extubated 3/20) due to hypoxia and somnolence, empiric SBP therapy ( not enough asites to tap)recurrent hypoglycemia requiring IV dextrose, severe cellulitis of lower extremities treated with vancomycin during time in critical care, anuric AKI/ATN prompted discontinuation of Vancomyin( 3/20), empiric IV albumin/midodrine/octreotide treatment (3/23-4/8), CRRT and eventually transition to intermittent HD( 4/2). He was transferred to [facility] service on 11/23/17 for further management and has been undergoing intermittent HD. Palliative is on board as patient has poor prognosis and unclear if he can tolerate HD long term. VVS evaluated and may get an AVG on Friday however after Nephrology and Vascular discussed case will hold on AVG at this time and will continue Dialysis through [facility] and process for outpatient Dialysis placement started per Nephro.  He was seen sitting in Chair in Dialysis today.   Assessment & Plan:   Active Problems:   Hypoxia   Acute respiratory failure with hypoxia (HCC)   Cirrhosis (HCC)   Acute renal failure (ARF) (HCC)   Goals of care, counseling/discussion   Palliative care by specialist   ESRD (end stage renal disease) on dialysis (HCC)  Thrombocytopenia (HCC)   Alcohol abuse   Pain, generalized  Anuric Acute Renal Failure now on Dialysis TTS - Presumed hemodynamically mediated/ ischemic ATN ( hypotension from SBP, ?some element of vancomycin toxicity which he was on for severe cellulitis for his lower extremities, some prior concern for HRS and had trial of albumin, midodrine, octreotride 3/23-4/8) -CRRT 3/26-3/29, s/p tunneled cath placement on 4/5, - -Patient is Negative 14.423 L this admission -His BP has been tolerating intermittent HD this past week -C/w Midodrine 10 mg TIDwm (less for HRS and more helpful in BP on iHD), Cctreotide 200 mcg sq BID, d/c'd albumin ( by nephro) -Poor prognosis given unclear patient can tolerate long term HD with his BP, palliative medicine following -12.5 mcg fentanyl patch to assist w/ back pain most prominent after HD sessions. We do NOT plan on discharging patient home on a fentanyl patch -Palliative involved for GOC; At this point patient wants to continue with Treatment after discussion with wife -Nephrology consulted and planning on continuing Dialysis -Vein Mapping done and he has an SVT in the Right Basilic Vein and Left Basilic Vein  -Vascular Surgery Consulted and only option is to do a a Left Arm AVG -If AVG will not be pursued at this time because of Soft Blood Pressures and because of Prognosis and ? Longevity on Dialysis for Tolerance  -Appreciate Nephrology Recc's and they have started search for outpatient Dialysis centers.   Decompensated Alcoholic cirrhosis with hepatic encephalopathy, stable -Normal mentation and continues to remain w/o asterixis -Not a Candidate for a Liver Transplant.  -Continue Lactulose po 20-g TID or Lactulose Enema 300 mL RC TID and Rifaximin 550 mg po BID regimen. -Volume Management per Dialysis  Hyperbilirubinemia and Elevated AST -Likely related to Cirrhosis; T Bili now 7.6 and AST now 80 -Hepatitis panel on admission negative  -Continue  to Monitor CMP in AM   Thrombocytopenia -Improving -Secondary to cirrhosis/hypersplenism -Platelet Count went from 87 -> 93 -> 95 -> 73 -> 92 -Asymptomatic -Continue to Monitor for S/Sx of Bleeding and repeat CBC in AM   Anemia, likely of chronic disease given ESRD and chronic liver disease -Anemia is Macrocytic and MCV is 112.6 -Hb/Hct went from 10.8/34.1 -> 9.6/30.5 -> 10.9/35.8 -Continue to Monitor for S/Sx of Bleeding -Repeat CBC in AM   Type 2 DM - Hemoglobin A1C 4.4 (11/2017), -On admission presented with hypoglycemia requiring IV dextrose as patient was still taking insulin despite no PO intake with Hepatic Encephalopathy - A1c 4.4%, likely hypersplenism? (portal hypertension associated with cirrhosis), no blood transfusions here, additionally 50 lb weight loss in 2 months maybe  -Elevated fasting glucose > 300 for several days -Less concern for hypoglycemia given patient is eating well -Increased Lantus to 18 units sq qHS, and increased Novolog Moderate Scale AC to Resistant SSI -CBG's ranging from 170-219 -Diabetes Education Coordinator Consulted for evaluation  Alcohol Abuse - Discussed importance of cessation with family and patient.  -C/w Folic Acid 1 mg po Daily and with Thiamine 100 mg po Daily  -C/w Quetiapine 25 mg po qHS  -Palliative Discussed AA Meetings with the Patient   Acute Hypoxic Respiratory Failure, resolved -S/p 2 intubations during hospitalization likely related to inability to protect airway in setting of decompensated cirrhosis/hepatic encephalopathy and metabolic derangements from worsening renal failure earlier in admission. -Continue to encourage incentive spirometry and monitor Respiratory Status carefully   Acute on Chronic Back pain -Flare worsened by recent HD session, continue oxycodone PRN q4h severe pain, and robaxin.   -C/w 12.5 mcg Fentanyl patch q72h added this admission, will closely monitor to ensure does not negatively affect  blood pressure. -C/w Lidocaine Patch TD q24h as well  -Palliative to find out about outpatient Pain Management and Control   DVT prophylaxis: SCDs given Thrombocytopenia Code Status: Partial Code Family Communication: No family at bedside in Dialysis   Disposition Plan: Remain Inpatient and D/C will go home with Home Health PT with RW and 3in1 when Dialysis Slot Available (per discussion with Dr. Allena Katz may be Next week)  Consultants:   Nephrology  Palliative Care Medicine  Vascular Surgery    Procedures:  VEIN MAPPING Upper Extremity Vein Map  Right Cephalic: Not visualized  Right Basilic  Segment Diameter Depth Comment  1. Axilla   Not visualized  2. Mid upper arm 2.33mm 19.30mm   3. Above [facility] 1.55mm 17.31mm   4. In [facility] 2.28mm 11.3mm Thrombosed  5. Below AC 1.70mm 18.7mm Thrombosed  6. Mid forearm 1.17mm 16.84mm Thrombosed  7. Wrist   Not visualized                  Left Cephalic  Segment Diameter Depth Comment  1. Axilla 1.59mm 10.96mm   2. Mid upper arm 0.33mm 3.64mm   3. Above [facility]   Not visualized  4. In [facility]   Not visualized  5. Below [facility]   Not visualized  6. Mid forearm   Not visualized  7. Wrist   Not visualized                  Left Basilic  Segment Diameter Depth Comment  1. Axilla 2.17mm 16.32mm Thrombosed  2. Mid upper arm 2.60mm 12.41mm Thrombosed  3. Above AC 2.26mm 12.52mm Thrombosed  4. In AC mm mm Not visualized  5. Below AC mm mm Not visualized  6. Mid forearm mm mm Not visualized  7. Wrist mm mm Not visualized   mm mm    mm mm    mm mm     Antimicrobials:  Anti-infectives (From admission, onward)   Start     Dose/Rate Route Frequency Ordered Stop   11/27/17 0800  ceFAZolin (ANCEF) IVPB 2g/100 mL premix     2 g 200 mL/hr over 30 Minutes Intravenous To Radiology 11/27/17 0747 11/27/17 1455   11/20/17 2200  rifaximin (XIFAXAN) tablet 550 mg     550 mg Oral 2 times daily 11/20/17  1004     11/17/17 1000  rifaximin (XIFAXAN) tablet 550 mg  Status:  Discontinued     550 mg Per Tube 2 times daily 11/17/17 0844 11/20/17 1004   11/16/17 2200  piperacillin-tazobactam (ZOSYN) IVPB 3.375 g  Status:  Discontinued     3.375 g 100 mL/hr over 30 Minutes Intravenous Every 6 hours 11/16/17 1902 11/19/17 1006   11/15/17 1400  piperacillin-tazobactam (ZOSYN) IVPB 3.375 g  Status:  Discontinued     3.375 g 12.5 mL/hr over 240 Minutes Intravenous Every 12 hours 11/15/17 1327 11/16/17 1902   11/11/17 2200  rifaximin (XIFAXAN) tablet 550 mg  Status:  Discontinued     550 mg Oral 2 times daily 11/11/17 1611 11/17/17 0844   11/08/17 2200  vancomycin (VANCOCIN) 1,500 mg in sodium chloride 0.9 % 500 mL IVPB  Status:  Discontinued     1,500 mg 250 mL/hr over 120 Minutes Intravenous Every 12 hours 11/08/17 1140 11/09/17 1210   11/08/17 0900  vancomycin (VANCOCIN) 2,000 mg in sodium chloride 0.9 % 500 mL IVPB     2,000 mg 250 mL/hr over 120 Minutes Intravenous  Once 11/08/17 0858 11/08/17 1200   11/07/17 1800  cefTRIAXone (ROCEPHIN) 2 g in sodium chloride 0.9 % 100 mL IVPB     2 g 200 mL/hr over 30 Minutes Intravenous Every 24 hours 11/07/17 1753 11/14/17 1759     Subjective: Seen and examined in Dialysis and stated he "felt weird sitting in the chair." Could not really elaborate on that. States he had an ok night. No CP or SOB. Still waiting to be CLIPPED to a Dialysis Center.   Objective: Vitals:   12/05/17 1030 12/05/17 1100 12/05/17 1131 12/05/17 1234  BP: (!) 95/57 101/61 (!) 112/54 97/75  Pulse: 67 62 (!) 54 67  Resp:   17 18  Temp:   98 F (36.7 C) 98.1 F (36.7 C)  TempSrc:   Oral Oral  SpO2:   98% 97%  Weight:   68.5 kg (151 lb 0.2 oz)   Height:        Intake/Output Summary (Last 24 hours) at 12/05/2017 1632 Last data filed at 12/05/2017 1630 Gross per 24 hour  Intake 220 ml  Output 2000 ml  Net -1780 ml   Filed Weights   12/04/17 0600 12/05/17 0606 12/05/17 1131    Weight: 72.7 kg (160 lb 4.4 oz) 71.5 kg (157 lb 10.1 oz) 68.5 kg (151 lb 0.2 oz)   Examination: Physical Exam:  Constitutional: WN/WD Jaundiced Caucasian Male in NAD sitting in Chair in Dialysis appears calm. Eyes: Sclerae anicteric **Addendum: Meant to say that Sclera were icteric**; Lids normal ENMT: External Ears and Nose appear normal. MMM Neck: Supple with no JVD Respiratory: Diminished to auscultation bilaterally; No  appreciable  Cardiovascular: RRR; No m/r/g. Has Trace LE edema Abdomen: Soft; Distended due to body habitus. Minimally tender. Bowel Sounds Present  GU: Deferred Musculoskeletal: No contractures; No cyanosis; Painful palpation on Right LE Leg  Skin: Warm and Dry; Has LE venous stasis changes and is wearing Mepilex pads on Left Leg. Patient is Jaundiced Neurologic: CN 2-12 grossly intact Psychiatric: Normal mood and affect. Intact judgement and insight  Data Reviewed: I have personally reviewed following labs and imaging studies  CBC: Recent Labs  Lab 12/01/17 0617 12/02/17 0551 12/03/17 0656 12/04/17 0639 12/05/17 0633  WBC 6.2 8.3 7.6 6.2 8.0  NEUTROABS  --   --  3.9 3.1 4.2  HGB 9.5* 10.4* 10.8* 9.6* 10.9*  HCT 30.7* 33.7* 34.1* 30.5* 35.8*  MCV 116.3* 115.0* 113.3* 113.0* 112.6*  PLT 87* 93* 95* 73* 92*   Basic Metabolic Panel: Recent Labs  Lab 11/30/17 0732 12/01/17 0617 12/01/17 1825 12/02/17 0551 12/03/17 0656 12/04/17 0639 12/05/17 0633  NA 138 139  --  138 140 137 139  K 3.7 4.0  --  3.4* 3.7 3.9 3.9  CL 103 103  --  98* 101 102 104  CO2 22 22  --  24 22 24  21*  GLUCOSE 351* 336* 409* 184* 145* 231* 126*  BUN 16 23*  --  17 27* 16 26*  CREATININE 5.47* 5.82*  --  4.49* 6.03* 3.98* 5.23*  CALCIUM 9.3 9.5  --  9.2 9.7 8.5* 9.7  MG  --   --   --   --  2.0 1.7 1.9  PHOS 3.0 2.8  --   --  2.7 3.4 3.2   GFR: Estimated Creatinine Clearance: 14.4 mL/min (A) (by C-G formula based on SCr of 5.23 mg/dL (H)). Liver Function Tests: Recent  Labs  Lab 11/30/17 0732 12/01/17 0617 12/02/17 0551 12/03/17 0656 12/04/17 0639 12/05/17 0633  AST 56*  --  74* 84* 78* 80*  ALT 14*  --  13* 14* 10* 16*  ALKPHOS 103  --  135* 144* 132* 157*  BILITOT 7.9*  --  8.1* 8.0* 6.6* 7.6*  PROT 7.1  --  7.4 7.9 7.2 8.1  ALBUMIN 4.0 4.1 4.1 4.4 3.7 4.2   No results for input(s): LIPASE, AMYLASE in the last 168 hours. No results for input(s): AMMONIA in the last 168 hours. Coagulation Profile: No results for input(s): INR, PROTIME in the last 168 hours. Cardiac Enzymes: No results for input(s): CKTOTAL, CKMB, CKMBINDEX, TROPONINI in the last 168 hours. BNP (last 3 results) No results for input(s): PROBNP in the last 8760 hours. HbA1C: No results for input(s): HGBA1C in the last 72 hours. CBG: Recent Labs  Lab 12/04/17 0807 12/04/17 1207 12/04/17 1747 12/04/17 2102 12/05/17 1224  GLUCAP 218* 545* 219* 170* 253*   Lipid Profile: No results for input(s): CHOL, HDL, LDLCALC, TRIG, CHOLHDL, LDLDIRECT in the last 72 hours. Thyroid Function Tests: No results for input(s): TSH, T4TOTAL, FREET4, T3FREE, THYROIDAB in the last 72 hours. Anemia Panel: No results for input(s): VITAMINB12, FOLATE, FERRITIN, TIBC, IRON, RETICCTPCT in the last 72 hours. Sepsis Labs: No results for input(s): PROCALCITON, LATICACIDVEN in the last 168 hours.  No results found for this or any previous visit (from the past 240 hour(s)).   Radiology Studies: No results found.  Scheduled Meds: . chlorhexidine  15 mL Mouth Rinse BID  . Chlorhexidine Gluconate Cloth  6 each Topical Daily  . famotidine  20 mg Oral QHS  . feeding supplement (  ENSURE ENLIVE)  237 mL Oral TID BM  . feeding supplement (PRO-STAT SUGAR FREE 64)  30 mL Oral TID  . fentaNYL  12.5 mcg Transdermal Q72H  . folic acid  1 mg Oral Daily  . insulin aspart  0-20 Units Subcutaneous TID WC  . insulin aspart  3 Units Subcutaneous TID WC  . insulin glargine  18 Units Subcutaneous QHS  . lactulose   20 g Per Tube TID   Or  . lactulose  300 mL Rectal TID  . lidocaine  1 patch Transdermal Q24H  . mouth rinse  15 mL Mouth Rinse q12n4p  . methocarbamol  500 mg Oral TID  . midodrine  10 mg Oral TID WC  . octreotide  200 mcg Subcutaneous BID  . QUEtiapine  25 mg Oral QHS  . rifaximin  550 mg Oral BID  . sodium chloride flush  10-40 mL Intracatheter Q12H  . thiamine  100 mg Oral Daily   Continuous Infusions: . sodium chloride Stopped (11/20/17 0900)    LOS: 28 days   Merlene Laughter, DO [facility] Pager [phone]  If 7PM-7AM, please contact night-coverage [email or web address] Password [facility] 12/05/2017, 4:32 PM

## 2017-12-06 LAB — COMPREHENSIVE METABOLIC PANEL
ALT: 15 U/L — ABNORMAL LOW (ref 17–63)
AST: 78 U/L — AB (ref 15–41)
Albumin: 4 g/dL (ref 3.5–5.0)
Alkaline Phosphatase: 164 U/L — ABNORMAL HIGH (ref 38–126)
Anion gap: 14 (ref 5–15)
BILIRUBIN TOTAL: 7.8 mg/dL — AB (ref 0.3–1.2)
BUN: 17 mg/dL (ref 6–20)
CO2: 23 mmol/L (ref 22–32)
CREATININE: 4.25 mg/dL — AB (ref 0.61–1.24)
Calcium: 9.5 mg/dL (ref 8.9–10.3)
Chloride: 99 mmol/L — ABNORMAL LOW (ref 101–111)
GFR calc Af Amer: 17 mL/min — ABNORMAL LOW (ref >60)
GFR, EST NON AFRICAN AMERICAN: 14 mL/min — AB (ref >60)
Glucose, Bld: 321 mg/dL — ABNORMAL HIGH (ref 65–99)
Potassium: 4.5 mmol/L (ref 3.5–5.1)
Sodium: 136 mmol/L (ref 135–145)
Total Protein: 8.2 g/dL — ABNORMAL HIGH (ref 6.5–8.1)

## 2017-12-06 LAB — CBC WITH DIFFERENTIAL/PLATELET
BASOS ABS: 0 10*3/uL (ref 0.0–0.1)
Basophils Relative: 0 %
Eosinophils Absolute: 0.3 10*3/uL (ref 0.0–0.7)
Eosinophils Relative: 4 %
HEMATOCRIT: 35 % — AB (ref 39.0–52.0)
Hemoglobin: 10.8 g/dL — ABNORMAL LOW (ref 13.0–17.0)
LYMPHS ABS: 2.3 10*3/uL (ref 0.7–4.0)
LYMPHS PCT: 32 %
MCH: 35 pg — AB (ref 26.0–34.0)
MCHC: 30.9 g/dL (ref 30.0–36.0)
MCV: 113.3 fL — AB (ref 78.0–100.0)
MONO ABS: 0.6 10*3/uL (ref 0.1–1.0)
Monocytes Relative: 9 %
NEUTROS ABS: 4 10*3/uL (ref 1.7–7.7)
Neutrophils Relative %: 55 %
Platelets: 70 10*3/uL — ABNORMAL LOW (ref 150–400)
RBC: 3.09 MIL/uL — AB (ref 4.22–5.81)
RDW: 14.8 % (ref 11.5–15.5)
WBC: 7.2 10*3/uL (ref 4.0–10.5)

## 2017-12-06 LAB — IRON AND TIBC
IRON: 45 ug/dL (ref 45–182)
Saturation Ratios: 46 % — ABNORMAL HIGH (ref 17.9–39.5)
TIBC: 98 ug/dL — AB (ref 250–450)
UIBC: 53 ug/dL

## 2017-12-06 LAB — MAGNESIUM: MAGNESIUM: 1.8 mg/dL (ref 1.7–2.4)

## 2017-12-06 LAB — GLUCOSE, CAPILLARY
GLUCOSE-CAPILLARY: 320 mg/dL — AB (ref 65–99)
Glucose-Capillary: 416 mg/dL — ABNORMAL HIGH (ref 65–99)
Glucose-Capillary: 124 mg/dL — ABNORMAL HIGH (ref 65–99)
Glucose-Capillary: 33 mg/dL — CL (ref 65–99)
Glucose-Capillary: 90 mg/dL (ref 65–99)

## 2017-12-06 LAB — PHOSPHORUS: Phosphorus: 3.8 mg/dL (ref 2.5–4.6)

## 2017-12-06 LAB — VITAMIN B12: VITAMIN B 12: 986 pg/mL — AB (ref 180–914)

## 2017-12-06 LAB — FERRITIN: FERRITIN: 230 ng/mL (ref 24–336)

## 2017-12-06 LAB — GLUCOSE, RANDOM: Glucose, Bld: 377 mg/dL — ABNORMAL HIGH (ref 65–99)

## 2017-12-06 LAB — RETICULOCYTES
RBC.: 3.09 MIL/uL — ABNORMAL LOW (ref 4.22–5.81)
RETIC COUNT ABSOLUTE: 52.5 10*3/uL (ref 19.0–186.0)
Retic Ct Pct: 1.7 % (ref 0.4–3.1)

## 2017-12-06 LAB — FOLATE: FOLATE: 28 ng/mL (ref >5.9)

## 2017-12-06 MED ORDER — INSULIN GLARGINE 100 UNIT/ML ~~LOC~~ SOLN
20.0000 [IU] | Freq: Every day | SUBCUTANEOUS | Status: DC
  Administered 2017-12-06: 20 [IU] via SUBCUTANEOUS
  Filled 2017-12-06 (×3): qty 0.2

## 2017-12-06 MED ORDER — INSULIN ASPART 100 UNIT/ML ~~LOC~~ SOLN
5.0000 [IU] | Freq: Once | SUBCUTANEOUS | Status: AC
  Administered 2017-12-06: 5 [IU] via SUBCUTANEOUS

## 2017-12-06 MED ORDER — INSULIN ASPART 100 UNIT/ML ~~LOC~~ SOLN
4.0000 [IU] | Freq: Three times a day (TID) | SUBCUTANEOUS | Status: DC
  Administered 2017-12-07 – 2017-12-09 (×7): 4 [IU] via SUBCUTANEOUS

## 2017-12-06 NOTE — Progress Notes (Signed)
PROGRESS NOTE    [name]  ZOX:[id number] DOB: 04-30-1961 DOA: 11/07/2017 PCP: Dettinger, Elige Radon, MD  Brief Narrative:  [name] is a 57 y.o. year old male with medical history significant for alcoholic cirrhosis, T2DM, PTSD, and chronic back pain who presented on 11/07/2017 with 2 weeks of abdominal swelling, worsening jaundice and somnolence and was found to have decompensated cirrhosis prompting admission to critical care for hepatic encephalopathy,hypoglycemia related to continued insulin therapy despite decreased PO intake, presumed SBP ( no paracentesis with insufficient ascites), INR coagulopathy and and eventually intubated (3/16) for inability to protect airway.    Prolonged hospital course included intubation on 3/16 (extubated 3/20) due to hypoxia and somnolence, empiric SBP therapy ( not enough asites to tap)recurrent hypoglycemia requiring IV dextrose, severe cellulitis of lower extremities treated with vancomycin during time in critical care, anuric AKI/ATN prompted discontinuation of Vancomyin( 3/20), empiric IV albumin/midodrine/octreotide treatment (3/23-4/8), CRRT and eventually transition to intermittent HD( 4/2). He was transferred to [facility] service on 11/23/17 for further management and has been undergoing intermittent HD. Palliative is on board as patient has poor prognosis and unclear if he can tolerate HD long term. VVS evaluated and may get an AVG on Friday however after Nephrology and Vascular discussed case will hold on AVG at this time and will continue Dialysis through [facility] and process for outpatient Dialysis placement started per Nephro.   Assessment & Plan:   Active Problems:   Hypoxia   Acute respiratory failure with hypoxia (HCC)   Cirrhosis (HCC)   Acute renal failure (ARF) (HCC)   Goals of care, counseling/discussion   Palliative care by specialist   ESRD (end stage renal disease) on dialysis (HCC)   Thrombocytopenia (HCC)   Alcohol abuse   Pain,  generalized  Anuric Acute Renal Failure now on Dialysis TTS -Presumed hemodynamically mediated/ ischemic ATN ( hypotension from SBP, ?some element of vancomycin toxicity which he was on for severe cellulitis for his lower extremities, some prior concern for HRS and had trial of albumin, midodrine, octreotride 3/23-4/8) -CRRT 3/26-3/29, s/p tunneled cath placement on 4/5, - -Patient is Negative 9.280 L this admission -His BP has been tolerating intermittent HD this past week -C/w Midodrine 10 mg TIDwm (less for HRS and more helpful in BP on iHD), Cctreotide 200 mcg sq BID, d/c'd albumin ( by nephro) -Poor prognosis given unclear patient can tolerate long term HD with his BP, palliative medicine following -C/w 12.5 mcg fentanyl patch to assist w/ back pain most prominent after HD sessions. We do NOT plan on discharging patient home on a fentanyl patch -BUN/Cr was 17/4.25 this AM  -Palliative involved for GOC; At this point patient wants to continue with Treatment after discussion with wife -Nephrology consulted and planning on continuing Dialysis -Vein Mapping done and he has an SVT in the Right Basilic Vein and Left Basilic Vein  -Vascular Surgery Consulted and only option is to do a a Left Arm AVG -AVG will not be pursued at this time because of Soft Blood Pressures and because of Prognosis and ? Longevity on Dialysis for Tolerance  -Appreciate Nephrology Recc's and they have started search for outpatient Dialysis centers.   Decompensated Alcoholic cirrhosis with hepatic encephalopathy, stable -Normal mentation and continues to remain w/o asterixis -Not a Candidate for a Liver Transplant.  -Continue Lactulose po 20-g TID or Lactulose Enema 300 mL RC TID and Rifaximin 550 mg po BID regimen. -Volume Management per Dialysis     Hyperbilirubinemia and  Elevated AST -Likely related to Cirrhosis; T Bili now 7.8 and AST now 78 -Hepatitis panel on admission negative  -Continue to Monitor CMP in AM    Thrombocytopenia -Fluctuating  -Secondary to cirrhosis/hypersplenism -Platelet Count went from 87 -> 93 -> 95 -> 73 -> 92 ->70 -Asymptomatic -Continue to Monitor for S/Sx of Bleeding and repeat CBC in AM   Anemia, likely of chronic disease given ESRD and chronic liver disease -Anemia is Macrocytic and MCV is 113.3 -Anemia Panel showed iron level 45, UIBC of 53, TIBC of 98, saturation ratios of 46, ferritin level 230, folate level 28.0, and vitamin B12 986. -Hb/Hct stable at 10.8/35.0 -Continue to Monitor for S/Sx of Bleeding -Repeat CBC in AM   Type 2 DM - Hemoglobin A1C 4.4 (11/2017), -On admission presented with hypoglycemia requiring IV dextrose as patient was still taking insulin despite no PO intake with Hepatic Encephalopathy - A1c 4.4%, likely hypersplenism? (portal hypertension associated with cirrhosis), no blood transfusions here, additionally 50 lb weight loss in 2 months maybe  -Elevated fasting glucose > 300 for several days -Less concern for hypoglycemia given patient is eating well -Increased Lantus to 20 units sq qHS, and increased Novolog Moderate Scale AC to Resistant SSI -Increased Pre-meal Insulin to 4 units TID wm -CBG's ranging from 111-416 -Diabetes Education Coordinator Consulted for evaluation and appreciate Recc's  Alcohol Abuse - Discussed importance of cessation with family and patient.  -C/w Folic Acid 1 mg po Daily and with Thiamine 100 mg po Daily  -C/w Quetiapine 25 mg po qHS  -Palliative Discussed AA Meetings with the Patient -Appreciate Palliative Care Recc's    Acute Hypoxic Respiratory Failure, resolved -S/p 2 intubations during hospitalization likely related to inability to protect airway in setting of decompensated cirrhosis/hepatic encephalopathy and metabolic derangements from worsening renal failure earlier in admission. -Continue to encourage incentive spirometry and monitor Respiratory Status carefully   Acute on Chronic Back  pain -Flare worsened by recent HD session, continue oxycodone PRN q4h severe pain, and robaxin.   -C/w 12.5 mcg Fentanyl patch q72h added this admission, will closely monitor to ensure does not negatively affect blood pressure. -C/w Lidocaine Patch TD q24h as well  -Palliative to find out about outpatient Pain Management and Control and appreciate their Assistance  DVT prophylaxis: SCDs given Thrombocytopenia Code Status: Partial Code Family Communication: No family at bedside in Dialysis   Disposition Plan: Remain Inpatient and D/C will go home with Home Health PT with RW and 3in1 when Dialysis Slot Available (per discussion with Dr. Allena Katz may be Next week)  Consultants:   Nephrology  Palliative Care Medicine  Vascular Surgery    Procedures:  VEIN MAPPING Upper Extremity Vein Map  Right Cephalic: Not visualized  Right Basilic  Segment Diameter Depth Comment  1. Axilla   Not visualized  2. Mid upper arm 2.46mm 19.38mm   3. Above [facility] 1.88mm 17.11mm   4. In [facility] 2.79mm 11.34mm Thrombosed  5. Below AC 1.83mm 18.60mm Thrombosed  6. Mid forearm 1.63mm 16.66mm Thrombosed  7. Wrist   Not visualized                  Left Cephalic  Segment Diameter Depth Comment  1. Axilla 1.25mm 10.16mm   2. Mid upper arm 0.47mm 3.55mm   3. Above [facility]   Not visualized  4. In [facility]   Not visualized  5. Below [facility]   Not visualized  6. Mid forearm   Not visualized  7. Wrist  Not visualized                  Left Basilic  Segment Diameter Depth Comment  1. Axilla 2.5455mm 16.37mm Thrombosed  2. Mid upper arm 2.1974mm 12.160mm Thrombosed  3. Above [facility] 2.2749mm 12.2320mm Thrombosed  4. In AC mm mm Not visualized  5. Below AC mm mm Not visualized  6. Mid forearm mm mm Not visualized  7. Wrist mm mm Not visualized   mm mm    mm mm    mm mm     Antimicrobials:  Anti-infectives (From admission, onward)   Start     Dose/Rate Route Frequency Ordered  Stop   11/27/17 0800  ceFAZolin (ANCEF) IVPB 2g/100 mL premix     2 g 200 mL/hr over 30 Minutes Intravenous To Radiology 11/27/17 0747 11/27/17 1455   11/20/17 2200  rifaximin (XIFAXAN) tablet 550 mg     550 mg Oral 2 times daily 11/20/17 1004     11/17/17 1000  rifaximin (XIFAXAN) tablet 550 mg  Status:  Discontinued     550 mg Per Tube 2 times daily 11/17/17 0844 11/20/17 1004   11/16/17 2200  piperacillin-tazobactam (ZOSYN) IVPB 3.375 g  Status:  Discontinued     3.375 g 100 mL/hr over 30 Minutes Intravenous Every 6 hours 11/16/17 1902 11/19/17 1006   11/15/17 1400  piperacillin-tazobactam (ZOSYN) IVPB 3.375 g  Status:  Discontinued     3.375 g 12.5 mL/hr over 240 Minutes Intravenous Every 12 hours 11/15/17 1327 11/16/17 1902   11/11/17 2200  rifaximin (XIFAXAN) tablet 550 mg  Status:  Discontinued     550 mg Oral 2 times daily 11/11/17 1611 11/17/17 0844   11/08/17 2200  vancomycin (VANCOCIN) 1,500 mg in sodium chloride 0.9 % 500 mL IVPB  Status:  Discontinued     1,500 mg 250 mL/hr over 120 Minutes Intravenous Every 12 hours 11/08/17 1140 11/09/17 1210   11/08/17 0900  vancomycin (VANCOCIN) 2,000 mg in sodium chloride 0.9 % 500 mL IVPB     2,000 mg 250 mL/hr over 120 Minutes Intravenous  Once 11/08/17 0858 11/08/17 1200   11/07/17 1800  cefTRIAXone (ROCEPHIN) 2 g in sodium chloride 0.9 % 100 mL IVPB     2 g 200 mL/hr over 30 Minutes Intravenous Every 24 hours 11/07/17 1753 11/14/17 1759     Subjective: Seen and examined at bedside sitting in his chair and had no active complaints.  States he felt different dialyzing in the chair yesterday. Pain is about the same.  No nausea or vomiting.  Denies any chest pain or shortness of breath.  States that he has having several loose bowel movements but did not realize it is from the lactulose.  Objective: Vitals:   12/05/17 1234 12/05/17 2139 12/06/17 0602 12/06/17 1220  BP: 97/75 106/72 91/71 107/64  Pulse: 67 (!) 55 72 (!) 58  Resp:  18 20 16    Temp: 98.1 F (36.7 C) 98.5 F (36.9 C) 98.2 F (36.8 C) 97.7 F (36.5 C)  TempSrc: Oral Oral Oral Oral  SpO2: 97% 100% 98% 99%  Weight:      Height:        Intake/Output Summary (Last 24 hours) at 12/06/2017 1545 Last data filed at 12/05/2017 1900 Gross per 24 hour  Intake 222 ml  Output -  Net 222 ml   Filed Weights   12/04/17 0600 12/05/17 0606 12/05/17 1131  Weight: 72.7 kg (160 lb 4.4 oz) 71.5 kg (157  lb 10.1 oz) 68.5 kg (151 lb 0.2 oz)   Examination: Physical Exam:  Constitutional: Well nourished well developed pleasant jaundiced Caucasian male who is currently in no acute distress sitting in chair bedside watching television. Eyes: Sclera are icteric. Lids are normal.  ENMT: External ears and nose appear normal.  Mucous membranes moist.  Neck grossly normal hearing Neck: Supple with no JVD. Respiratory: Diminished to auscultation bilaterally.  No appreciable wheezing, rales, or rhonchi.  Unlabored breathing with no accessory muscle use. Cardiovascular: Slightly bradycardic but regular rhythm.  No appreciable lower extremity edema today Abdomen: Soft, distended due to body habitus and ascites, slightly tender.  Bowel sounds present in all 4 quadrants GU: Deferred Musculoskeletal: No contractures, no cyanosis.  Has painful palpation of the right lower extremity from neuropathy Skin: Warm and dry, has lower extremity venous stasis changes and leg wounds in the left leg which are covered with Mepilex.  Patient is jaundiced Neurologic: Cranial nerves II through XII grossly intact. Psychiatric: Normal pleasant mood and affect.  Intact judgment and insight.  Awake and alert and oriented x3  Data Reviewed: I have personally reviewed following labs and imaging studies  CBC: Recent Labs  Lab 12/02/17 0551 12/03/17 0656 12/04/17 0639 12/05/17 0633 12/06/17 0625  WBC 8.3 7.6 6.2 8.0 7.2  NEUTROABS  --  3.9 3.1 4.2 4.0  HGB 10.4* 10.8* 9.6* 10.9* 10.8*  HCT  33.7* 34.1* 30.5* 35.8* 35.0*  MCV 115.0* 113.3* 113.0* 112.6* 113.3*  PLT 93* 95* 73* 92* 70*   Basic Metabolic Panel: Recent Labs  Lab 12/01/17 0617  12/02/17 0551 12/03/17 0656 12/04/17 0639 12/05/17 0633 12/06/17 0625 12/06/17 1313  NA 139  --  138 140 137 139 136  --   K 4.0  --  3.4* 3.7 3.9 3.9 4.5  --   CL 103  --  98* 101 102 104 99*  --   CO2 22  --  24 22 24  21* 23  --   GLUCOSE 336*   < > 184* 145* 231* 126* 321* 377*  BUN 23*  --  17 27* 16 26* 17  --   CREATININE 5.82*  --  4.49* 6.03* 3.98* 5.23* 4.25*  --   CALCIUM 9.5  --  9.2 9.7 8.5* 9.7 9.5  --   MG  --   --   --  2.0 1.7 1.9 1.8  --   PHOS 2.8  --   --  2.7 3.4 3.2 3.8  --    < > = values in this interval not displayed.   GFR: Estimated Creatinine Clearance: 17.7 mL/min (A) (by C-G formula based on SCr of 4.25 mg/dL (H)). Liver Function Tests: Recent Labs  Lab 12/02/17 0551 12/03/17 0656 12/04/17 0639 12/05/17 0633 12/06/17 0625  AST 74* 84* 78* 80* 78*  ALT 13* 14* 10* 16* 15*  ALKPHOS 135* 144* 132* 157* 164*  BILITOT 8.1* 8.0* 6.6* 7.6* 7.8*  PROT 7.4 7.9 7.2 8.1 8.2*  ALBUMIN 4.1 4.4 3.7 4.2 4.0   No results for input(s): LIPASE, AMYLASE in the last 168 hours. No results for input(s): AMMONIA in the last 168 hours. Coagulation Profile: No results for input(s): INR, PROTIME in the last 168 hours. Cardiac Enzymes: No results for input(s): CKTOTAL, CKMB, CKMBINDEX, TROPONINI in the last 168 hours. BNP (last 3 results) No results for input(s): PROBNP in the last 8760 hours. HbA1C: No results for input(s): HGBA1C in the last 72 hours. CBG: Recent Labs  Lab  12/05/17 1224 12/05/17 1710 12/05/17 2136 12/06/17 0756 12/06/17 1218  GLUCAP 253* 137* 111* 320* 416*   Lipid Profile: No results for input(s): CHOL, HDL, LDLCALC, TRIG, CHOLHDL, LDLDIRECT in the last 72 hours. Thyroid Function Tests: No results for input(s): TSH, T4TOTAL, FREET4, T3FREE, THYROIDAB in the last 72 hours. Anemia  Panel: Recent Labs    12/06/17 0625  VITAMINB12 986*  FOLATE 28.0  FERRITIN 230  TIBC 98*  IRON 45  RETICCTPCT 1.7   Sepsis Labs: No results for input(s): PROCALCITON, LATICACIDVEN in the last 168 hours.  No results found for this or any previous visit (from the past 240 hour(s)).   Radiology Studies: No results found.  Scheduled Meds: . chlorhexidine  15 mL Mouth Rinse BID  . Chlorhexidine Gluconate Cloth  6 each Topical Daily  . famotidine  20 mg Oral QHS  . feeding supplement (ENSURE ENLIVE)  237 mL Oral TID BM  . feeding supplement (PRO-STAT SUGAR FREE 64)  30 mL Oral TID  . fentaNYL  12.5 mcg Transdermal Q72H  . folic acid  1 mg Oral Daily  . insulin aspart  0-20 Units Subcutaneous TID WC  . insulin aspart  3 Units Subcutaneous TID WC  . insulin glargine  18 Units Subcutaneous QHS  . lactulose  20 g Per Tube TID   Or  . lactulose  300 mL Rectal TID  . lidocaine  1 patch Transdermal Q24H  . mouth rinse  15 mL Mouth Rinse q12n4p  . methocarbamol  500 mg Oral TID  . midodrine  10 mg Oral TID WC  . QUEtiapine  25 mg Oral QHS  . rifaximin  550 mg Oral BID  . sodium chloride flush  10-40 mL Intracatheter Q12H  . thiamine  100 mg Oral Daily   Continuous Infusions: . sodium chloride Stopped (11/20/17 0900)    LOS: 29 days   Merlene Laughter, DO [facility] Pager [phone]  If 7PM-7AM, please contact night-coverage [email or web address] Password [facility] 12/06/2017, 3:45 PM

## 2017-12-06 NOTE — Progress Notes (Addendum)
Patient ID: [name], male   DOB: July 01, 1961, 57 y.o.   MRN: [id number] [facility] Progress Note   Assessment/ Plan:   1. Anuric acute kidney injury, likely ESRD-on renal replacement therapy since 11/16/17 (CRRT transitioned to IHD) and process initiated for outpatient dialysis unit placement.  Based on the initial workup/assessment by Dr. Flossie Dibble was determined to have ATN rather than HRS for which RRT was started with empiric treatment for HRS. He is not a candidate for liver transplant due to recent alcohol use prior to admission.  Seen by vascular surgery and he does not have any targets for AV fistula creation-soft blood pressures increasing apprehension for AVG creation at this time especially with questions regarding his prognosis/longevity on dialysis.  Albumin/octreotide stopped, continue midodrine for blood pressure support.  Continue hemodialysis on a TTS schedule. 2. Alcoholic cirrhosis with encephalopathy-not a candidate for liver transplantation referral at this time given recent alcohol use, albumin levels remain surprisingly elevated post infusions/hemodialysis.  3. Thrombocytopenia-secondary to cirrhosis/hypersplenism from portal hypertension.  Without bleeding diastasis. 4. Alcohol abuse-we educated regarding cessation from alcohol and need for follow-up with gastroenterology.  5. Disposition-appreciate input from palliative care service and the process for outpatient dialysis unit placement is underway.  Subjective:   Underwent hemodialysis in recliner yesterday-states that this is likely going to get being used to.   Objective:   BP 91/71 (BP Location: Left Arm)   Pulse 72   Temp 98.2 F (36.8 C) (Oral)   Resp 16   Ht 5\' 6"  (1.676 m)   Wt 68.5 kg (151 lb 0.2 oz) Comment: stood to scale   SpO2 98%   BMI 24.37 kg/m   Intake/Output Summary (Last 24 hours) at 12/06/2017 1027 Last data filed at 12/05/2017 1900 Gross per 24 hour  Intake 444 ml  Output  2000 ml  Net -1556 ml   Weight change: -3 kg (-6 lb 9.8 oz)  Physical Exam: Gen: Comfortably resting in bed CVS: Pulse regular rhythm, ejection systolic murmur over apex Resp: Decreased breath sounds over bases, no rales, right IJ TDC Abd: Soft, distended, nontender Ext: Trace-1+ bilateral lower extremity edema with dressings over shallow skin ulcers over left pretibial surface  Imaging: No results found.  Labs: BMET Recent Labs  Lab 11/30/17 0732 12/01/17 0617 12/01/17 1825 12/02/17 0551 12/03/17 0656 12/04/17 0639 12/05/17 0633 12/06/17 0625  NA 138 139  --  138 140 137 139 136  K 3.7 4.0  --  3.4* 3.7 3.9 3.9 4.5  CL 103 103  --  98* 101 102 104 99*  CO2 22 22  --  24 22 24  21* 23  GLUCOSE 351* 336* 409* 184* 145* 231* 126* 321*  BUN 16 23*  --  17 27* 16 26* 17  CREATININE 5.47* 5.82*  --  4.49* 6.03* 3.98* 5.23* 4.25*  CALCIUM 9.3 9.5  --  9.2 9.7 8.5* 9.7 9.5  PHOS 3.0 2.8  --   --  2.7 3.4 3.2 3.8   CBC Recent Labs  Lab 12/03/17 0656 12/04/17 0639 12/05/17 0633 12/06/17 0625  WBC 7.6 6.2 8.0 7.2  NEUTROABS 3.9 3.1 4.2 4.0  HGB 10.8* 9.6* 10.9* 10.8*  HCT 34.1* 30.5* 35.8* 35.0*  MCV 113.3* 113.0* 112.6* 113.3*  PLT 95* 73* 92* 70*    Medications:    . chlorhexidine  15 mL Mouth Rinse BID  . Chlorhexidine Gluconate Cloth  6 each Topical Daily  . famotidine  20 mg Oral QHS  .  feeding supplement (ENSURE ENLIVE)  237 mL Oral TID BM  . feeding supplement (PRO-STAT SUGAR FREE 64)  30 mL Oral TID  . fentaNYL  12.5 mcg Transdermal Q72H  . folic acid  1 mg Oral Daily  . insulin aspart  0-20 Units Subcutaneous TID WC  . insulin aspart  3 Units Subcutaneous TID WC  . insulin glargine  18 Units Subcutaneous QHS  . lactulose  20 g Per Tube TID   Or  . lactulose  300 mL Rectal TID  . lidocaine  1 patch Transdermal Q24H  . mouth rinse  15 mL Mouth Rinse q12n4p  . methocarbamol  500 mg Oral TID  . midodrine  10 mg Oral TID WC  . QUEtiapine  25 mg Oral QHS   . rifaximin  550 mg Oral BID  . sodium chloride flush  10-40 mL Intracatheter Q12H  . thiamine  100 mg Oral Daily   Zetta BillsJay Emeterio Balke, MD 12/06/2017, 10:27 AM

## 2017-12-06 NOTE — Progress Notes (Signed)
Hypoglycemic Event  CBG: 33  Treatment: Gave   Symptoms: Asympyomatic  Follow-up CBG: Time:2259 CBG Result:90  Possible Reasons for Event: Pt.not eaten hid dinner,becouse there is onion on the sandwith.nurse order sandwich from [employer] and ate dinner.  Comments/MD notified:Via Amion    [name]

## 2017-12-06 NOTE — Progress Notes (Signed)
Patients blood sugar is 416. Paged MD. Awaiting return call.

## 2017-12-07 LAB — COMPREHENSIVE METABOLIC PANEL
ALBUMIN: 3.4 g/dL — AB (ref 3.5–5.0)
ALK PHOS: 131 U/L — AB (ref 38–126)
ALT: 13 U/L — AB (ref 17–63)
AST: 62 U/L — AB (ref 15–41)
Anion gap: 14 (ref 5–15)
BUN: 36 mg/dL — ABNORMAL HIGH (ref 6–20)
CALCIUM: 8.9 mg/dL (ref 8.9–10.3)
CO2: 20 mmol/L — AB (ref 22–32)
CREATININE: 5.85 mg/dL — AB (ref 0.61–1.24)
Chloride: 100 mmol/L — ABNORMAL LOW (ref 101–111)
GFR calc non Af Amer: 10 mL/min — ABNORMAL LOW (ref >60)
GFR, EST AFRICAN AMERICAN: 11 mL/min — AB (ref >60)
GLUCOSE: 462 mg/dL — AB (ref 65–99)
Potassium: 5.4 mmol/L — ABNORMAL HIGH (ref 3.5–5.1)
SODIUM: 134 mmol/L — AB (ref 135–145)
Total Bilirubin: 6.3 mg/dL — ABNORMAL HIGH (ref 0.3–1.2)
Total Protein: 6.6 g/dL (ref 6.5–8.1)

## 2017-12-07 LAB — BASIC METABOLIC PANEL
Anion gap: 16 — ABNORMAL HIGH (ref 5–15)
BUN: 42 mg/dL — ABNORMAL HIGH (ref 6–20)
CALCIUM: 9.1 mg/dL (ref 8.9–10.3)
CHLORIDE: 97 mmol/L — AB (ref 101–111)
CO2: 18 mmol/L — AB (ref 22–32)
Creatinine, Ser: 6.18 mg/dL — ABNORMAL HIGH (ref 0.61–1.24)
GFR calc Af Amer: 11 mL/min — ABNORMAL LOW (ref >60)
GFR calc non Af Amer: 9 mL/min — ABNORMAL LOW (ref >60)
Glucose, Bld: 567 mg/dL (ref 65–99)
Potassium: 5.5 mmol/L — ABNORMAL HIGH (ref 3.5–5.1)
Sodium: 131 mmol/L — ABNORMAL LOW (ref 135–145)

## 2017-12-07 LAB — GLUCOSE, CAPILLARY
GLUCOSE-CAPILLARY: 337 mg/dL — AB (ref 65–99)
GLUCOSE-CAPILLARY: 473 mg/dL — AB (ref 65–99)
GLUCOSE-CAPILLARY: 570 mg/dL — AB (ref 65–99)
GLUCOSE-CAPILLARY: 409 mg/dL — AB (ref 65–99)
GLUCOSE-CAPILLARY: 181 mg/dL — AB (ref 65–99)
GLUCOSE-CAPILLARY: 38 mg/dL — AB (ref 65–99)
Glucose-Capillary: 281 mg/dL — ABNORMAL HIGH (ref 65–99)
Glucose-Capillary: 170 mg/dL — ABNORMAL HIGH (ref 65–99)

## 2017-12-07 LAB — PHOSPHORUS: PHOSPHORUS: 5.6 mg/dL — AB (ref 2.5–4.6)

## 2017-12-07 LAB — MAGNESIUM: Magnesium: 1.7 mg/dL (ref 1.7–2.4)

## 2017-12-07 LAB — CBC WITH DIFFERENTIAL/PLATELET
BASOS ABS: 0.1 10*3/uL (ref 0.0–0.1)
BASOS PCT: 1 %
EOS ABS: 0.1 10*3/uL (ref 0.0–0.7)
Eosinophils Relative: 2 %
HCT: 30.5 % — ABNORMAL LOW (ref 39.0–52.0)
HEMOGLOBIN: 9.7 g/dL — AB (ref 13.0–17.0)
LYMPHS PCT: 28 %
Lymphs Abs: 1.8 10*3/uL (ref 0.7–4.0)
MCH: 35.3 pg — ABNORMAL HIGH (ref 26.0–34.0)
MCHC: 31.8 g/dL (ref 30.0–36.0)
MCV: 110.9 fL — ABNORMAL HIGH (ref 78.0–100.0)
Monocytes Absolute: 0.5 10*3/uL (ref 0.1–1.0)
Monocytes Relative: 8 %
NEUTROS PCT: 61 %
Neutro Abs: 3.9 10*3/uL (ref 1.7–7.7)
PLATELETS: 77 10*3/uL — AB (ref 150–400)
RBC: 2.75 MIL/uL — AB (ref 4.22–5.81)
RDW: 14.7 % (ref 11.5–15.5)
WBC: 6.4 10*3/uL (ref 4.0–10.5)

## 2017-12-07 MED ORDER — INSULIN ASPART 100 UNIT/ML ~~LOC~~ SOLN
6.0000 [IU] | Freq: Once | SUBCUTANEOUS | Status: AC
  Administered 2017-12-07: 6 [IU] via SUBCUTANEOUS

## 2017-12-07 MED ORDER — INSULIN ASPART 100 UNIT/ML ~~LOC~~ SOLN: 0.0000 [IU] | Freq: Every day | SUBCUTANEOUS | Status: DC

## 2017-12-07 MED ORDER — DEXTROSE 50 % IV SOLN
INTRAVENOUS | Status: AC
  Administered 2017-12-07: 50 mL
  Filled 2017-12-07: qty 50

## 2017-12-07 NOTE — Progress Notes (Signed)
Inpatient Diabetes Program Recommendations  AACE/ADA: New Consensus Statement on Inpatient Glycemic Control (2015)  Target Ranges:  Prepandial:   less than 140 mg/dL      Peak postprandial:   less than 180 mg/dL (1-2 hours)      Critically ill patients:  140 - 180 mg/dL  Results for Faythe GheeHARRISON, Axel P (MRN [id number]) as of 12/07/2017 09:19  Ref. Range 12/06/2017 07:56 12/06/2017 12:18 12/06/2017 17:17 12/06/2017 22:03 12/06/2017 22:59 12/07/2017 02:56 12/07/2017 07:54  Glucose-Capillary Latest Ref Range: 65 - 99 mg/dL 244320 (H)  Novolog 18 units 416 (H)  Novolog 28 units 124 (H)  Novolog 3 units 33 (LL) 90  Lantus 20 units 337 (H) 473 (H)    Review of Glycemic Control  Diabetes history: DM2 Outpatient Diabetes medications: Lantus 37 units daily, Novolog 9-17 units TID with meals Current orders for Inpatient glycemic control: Lantus 20 units QHS, Novolog 4 units TID with meals, Novolog 0-20 units TID with meals  Inpatient Diabetes Program Recommendations: Insulin - Basal: In reviewing chart, noted patient did NOT receive Lantus on 12/05/17 (charted as patient refused and MD was made aware) and glucose up to 416 mg/dl on 0/10/274/14/19 as a result. Noted CBG of 33 mg/dl last night at 25:3622:03 and per RN note patient did not eat supper tray so RN ordered patient a sandwich from [employer] around 11:45 pm. Patient's glucose up to 473 mg/dl this morning as a result of eating meal late, not getting any meal coverage insulin to cover carbohydrates consumed, and possibily overtreatment of hypolgycemia.    Correction (SSI): Please consider adding Novolog 0-5 units QHS for bedtime correction scale.  Thanks, Orlando PennerMarie Ricardo Kayes, RN, MSN, CDE Diabetes Coordinator Inpatient Diabetes Program [phone] (Team Pager from 8am to 5pm)

## 2017-12-07 NOTE — Progress Notes (Signed)
PROGRESS NOTE    PEPPER WYNDHAM  ZOX:[id number] DOB: [birth year] DOA: 11/07/2017 PCP: Dettinger, Elige Radon, MD  Brief Narrative:  [name] is a 57 y.o. year old male with medical history significant for alcoholic cirrhosis, T2DM, PTSD, and chronic back pain who presented on 11/07/2017 with 2 weeks of abdominal swelling, worsening jaundice and somnolence and was found to have decompensated cirrhosis prompting admission to critical care for hepatic encephalopathy,hypoglycemia related to continued insulin therapy despite decreased PO intake, presumed SBP ( no paracentesis with insufficient ascites), INR coagulopathy and and eventually intubated (3/16) for inability to protect airway.    Prolonged hospital course included intubation on 3/16 (extubated 3/20) due to hypoxia and somnolence, empiric SBP therapy ( not enough asites to tap)recurrent hypoglycemia requiring IV dextrose, severe cellulitis of lower extremities treated with vancomycin during time in critical care, anuric AKI/ATN prompted discontinuation of Vancomyin( 3/20), empiric IV albumin/midodrine/octreotide treatment (3/23-4/8), CRRT and eventually transition to intermittent HD( 4/2). He was transferred to [facility] service on 11/23/17 for further management and has been undergoing intermittent HD. Palliative is on board as patient has poor prognosis and unclear if he can tolerate HD long term. VVS evaluated and may get an AVG on Friday however after Nephrology and Vascular discussed case will hold on AVG at this time and will continue Dialysis through [facility] and process for outpatient Dialysis placement started per Nephro.   Hospitalization has been complicated by uncontrolled hyperglycemia.  Upon further review is actually noted the patient has not been receiving his insulin doses appropriately and has been eating rice crispy treats and eating food otherwise provided to him.  Assessment & Plan:   Active Problems:   Hypoxia   Acute respiratory  failure with hypoxia (HCC)   Cirrhosis (HCC)   Acute renal failure (ARF) (HCC)   Goals of care, counseling/discussion   Palliative care by specialist   ESRD (end stage renal disease) on dialysis (HCC)   Thrombocytopenia (HCC)   Alcohol abuse   Pain, generalized  Anuric Acute Renal Failure now on Dialysis TTS -Presumed hemodynamically mediated/ ischemic ATN ( hypotension from SBP, ?some element of vancomycin toxicity which he was on for severe cellulitis for his lower extremities, some prior concern for HRS and had trial of albumin, midodrine, octreotride 3/23-4/8) -CRRT 3/26-3/29, s/p tunneled cath placement on 4/5, - -Patient is Negative 9.280 L this admission -His BP has been tolerating intermittent HD this past week -C/w Midodrine 10 mg TIDwm (less for HRS and more helpful in BP on iHD), Octreotide 200 mcg sq BID, d/c'd albumin ( by nephro) -Poor prognosis given unclear patient can tolerate long term HD with his BP, palliative medicine following -C/w 12.5 mcg fentanyl patch to assist w/ back pain most prominent after HD sessions. We do NOT plan on discharging patient home on a fentanyl patch -BUN/Cr was 42/6.18 this AM  -Palliative involved for GOC; At this point patient wants to continue with Treatment after discussion with wife -Nephrology consulted and planning on continuing Dialysis -Vein Mapping done and he has an SVT in the Right Basilic Vein and Left Basilic Vein  -Vascular Surgery Consulted and only option is to do a a Left Arm AVG -AVG will not be pursued at this time because of Soft Blood Pressures and because of Prognosis and ? Longevity on Dialysis for Tolerance  -Appreciate Nephrology Recc's and they have started search for outpatient Dialysis centers.   Decompensated Alcoholic cirrhosis with hepatic encephalopathy, stable -Normal mentation and continues to remain  w/o asterixis -Not a Candidate for a Liver Transplant.  -Continue Lactulose po 20-g TID or Lactulose Enema  300 mL RC TID and Rifaximin 550 mg po BID regimen. -Volume Management per Dialysis    -LFTs this a.m. showed an AST of 62, and ALT of 13 and a T bili of 6.3  Hyperbilirubinemia and Elevated AST -Likely related to Cirrhosis; T Bili now is 6.3 and AST now 62 -Hepatitis panel on admission negative  -Continue to Monitor CMP in AM   Thrombocytopenia -Fluctuating  -Secondary to cirrhosis/hypersplenism -Platelet Count went from 87 -> 93 -> 95 -> 73 -> 92 ->70 -> 77 -Asymptomatic -Continue to Monitor for S/Sx of Bleeding and repeat CBC in AM   Anemia, likely of chronic disease given ESRD and chronic liver disease -Anemia is Macrocytic and MCV is 113.3 -Anemia Panel showed iron level 45, UIBC of 53, TIBC of 98, saturation ratios of 46, ferritin level 230, folate level 28.0, and vitamin B12 986. -Hb/Hct stable at 9.7/30.5 -Continue to Monitor for S/Sx of Bleeding -Repeat CBC in AM   Type 2 DM - Hemoglobin A1C 4.4 (11/2017), -On admission presented with hypoglycemia requiring IV dextrose as patient was still taking insulin despite no PO intake with Hepatic Encephalopathy - A1c 4.4%, likely hypersplenism? (portal hypertension associated with cirrhosis), no blood transfusions here, additionally 50 lb weight loss in 2 months maybe  -Elevated fasting glucose > 300 for several days -Less concern for hypoglycemia given patient is eating well -Increased Lantus to 23 units sq qHS, and increased Novolog Moderate Scale AC to Resistant SSI -Increased Pre-meal Insulin to 4 units TID wm -Is been given additional NovoLog doses. -CBG's ranging from  181-570 -Diabetes Education Coordinator Consulted for evaluation and appreciate Recc's; NovoLog 0-5 units nightly for bedtime correction scale was also added  Alcohol Abuse - Discussed importance of cessation with family and patient.  -C/w Folic Acid 1 mg po Daily and with Thiamine 100 mg po Daily  -C/w Quetiapine 25 mg po qHS  -Palliative Discussed AA  Meetings with the Patient -Appreciate Palliative Care Recc's    Acute Hypoxic Respiratory Failure, resolved -S/p 2 intubations during hospitalization likely related to inability to protect airway in setting of decompensated cirrhosis/hepatic encephalopathy and metabolic derangements from worsening renal failure earlier in admission. -Continue to encourage incentive spirometry and monitor Respiratory Status carefully   Acute on Chronic Back pain -Flare worsened by recent HD session, continue oxycodone PRN q4h severe pain, and robaxin.  Patient complained of severe pain today.   -C/w 12.5 mcg Fentanyl patch q72h added this admission, will closely monitor to ensure does not negatively affect blood pressure. -C/w Lidocaine Patch TD q24h as well  -Palliative to find out about outpatient Pain Management and Control and appreciate their Assistance  Hyponatremia -Mild at 131 today and dropped from 136 -Likely to be contracted in dialysis. -Repeat CMP in a.m.  Hyperkalemia -Patient's  K+ was 5.5 this a.m. -Likely to be corrected in dialysis. -Repeat CMP in a.m.  Hyperphosphatemia -Patient's Phos level was 5.6 this a.m. -Likely to be corrected in dialysis -Repeat PhosLo in a.m.  DVT prophylaxis: SCDs given Thrombocytopenia Code Status: Partial Code Family Communication: No family at bedside in Dialysis   Disposition Plan: Remain Inpatient and D/C will go home with Home Health PT with RW and 3in1 when Dialysis Slot Available   Consultants:   Nephrology  Palliative Care Medicine  Vascular Surgery    Procedures:  VEIN MAPPING Upper Extremity Vein Map  Right  Cephalic: Not visualized  Right Basilic  Segment Diameter Depth Comment  1. Axilla   Not visualized  2. Mid upper arm 2.3243mm 19.349mm   3. Above [facility] 1.5295mm 17.5510mm   4. In [facility] 2.6519mm 11.2650mm Thrombosed  5. Below AC 1.583mm 18.25mm Thrombosed  6. Mid forearm 1.9692mm 16.5410mm Thrombosed  7. Wrist   Not visualized                   Left Cephalic  Segment Diameter Depth Comment  1. Axilla 1.5528mm 10.195mm   2. Mid upper arm 0.8256mm 3.7719mm   3. Above [facility]   Not visualized  4. In [facility]   Not visualized  5. Below [facility]   Not visualized  6. Mid forearm   Not visualized  7. Wrist   Not visualized                  Left Basilic  Segment Diameter Depth Comment  1. Axilla 2.5055mm 16.447mm Thrombosed  2. Mid upper arm 2.3274mm 12.6060mm Thrombosed  3. Above [facility] 2.6449mm 12.6720mm Thrombosed  4. In AC mm mm Not visualized  5. Below AC mm mm Not visualized  6. Mid forearm mm mm Not visualized  7. Wrist mm mm Not visualized   mm mm    mm mm    mm mm     Antimicrobials:  Anti-infectives (From admission, onward)   Start     Dose/Rate Route Frequency Ordered Stop   11/27/17 0800  ceFAZolin (ANCEF) IVPB 2g/100 mL premix     2 g 200 mL/hr over 30 Minutes Intravenous To Radiology 11/27/17 0747 11/27/17 1455   11/20/17 2200  rifaximin (XIFAXAN) tablet 550 mg     550 mg Oral 2 times daily 11/20/17 1004     11/17/17 1000  rifaximin (XIFAXAN) tablet 550 mg  Status:  Discontinued     550 mg Per Tube 2 times daily 11/17/17 0844 11/20/17 1004   11/16/17 2200  piperacillin-tazobactam (ZOSYN) IVPB 3.375 g  Status:  Discontinued     3.375 g 100 mL/hr over 30 Minutes Intravenous Every 6 hours 11/16/17 1902 11/19/17 1006   11/15/17 1400  piperacillin-tazobactam (ZOSYN) IVPB 3.375 g  Status:  Discontinued     3.375 g 12.5 mL/hr over 240 Minutes Intravenous Every 12 hours 11/15/17 1327 11/16/17 1902   11/11/17 2200  rifaximin (XIFAXAN) tablet 550 mg  Status:  Discontinued     550 mg Oral 2 times daily 11/11/17 1611 11/17/17 0844   11/08/17 2200  vancomycin (VANCOCIN) 1,500 mg in sodium chloride 0.9 % 500 mL IVPB  Status:  Discontinued     1,500 mg 250 mL/hr over 120 Minutes Intravenous Every 12 hours 11/08/17 1140 11/09/17 1210   11/08/17 0900  vancomycin (VANCOCIN) 2,000 mg in sodium  chloride 0.9 % 500 mL IVPB     2,000 mg 250 mL/hr over 120 Minutes Intravenous  Once 11/08/17 0858 11/08/17 1200   11/07/17 1800  cefTRIAXone (ROCEPHIN) 2 g in sodium chloride 0.9 % 100 mL IVPB     2 g 200 mL/hr over 30 Minutes Intravenous Every 24 hours 11/07/17 1753 11/14/17 1759     Subjective: Seen and examined at bedside sitting in a chair today complaining of pain all over especially in his legs.  States he did not have a good day for.  Denied any nausea, vomiting, lightheadedness or dizziness.  Objective: Vitals:   12/06/17 1745 12/06/17 2211 12/07/17 0555 12/07/17 1343  BP:  116/63 100/60 97/64  Pulse:  (!) 56 (!) 58 78  Resp:    12  Temp:  (!) 97.5 F (36.4 C) 98.4 F (36.9 C) 97.7 F (36.5 C)  TempSrc:  Oral Oral Oral  SpO2:  97% 96% 100%  Weight: 70.8 kg (156 lb)     Height:       No intake or output data in the 24 hours ending 12/07/17 1742 Filed Weights   12/05/17 0606 12/05/17 1131 12/06/17 1745  Weight: 71.5 kg (157 lb 10.1 oz) 68.5 kg (151 lb 0.2 oz) 70.8 kg (156 lb)   Examination: Physical Exam:  Constitutional: Well-nourished, well-developed pleasant jaundiced Caucasian male who is complaining of some pain today. Eyes: Sclera is icteric.  Lids normal. ENMT: External ears and nose appear normal. Neck: Supple with no JVD Respiratory: Diminished to auscultation bilaterally.  Unlabored breathing.  No appreciable wheezing, rales, rhonchi. Cardiovascular: Regular rate and rhythm.  Slight murmur. Abdomen: Soft, distended due to body habitus and ascites, bowel sounds present in all 4 quadrants.  Slightly tender to palpation GU: Deferred Musculoskeletal: No contractures, no cyanosis Skin: Warm and dry.  Had some lower extremity venous stasis changes and minor skin ulcers in the left leg which were covered with Mepilex pad.  Patient is jaundiced stable Neurologic: Cranial nerves II through XII grossly intact Psychiatric: Depressed appearing mood.  Anxious affect.   Awake and alert and oriented x3.  Data Reviewed: I have personally reviewed following labs and imaging studies  CBC: Recent Labs  Lab 12/03/17 0656 12/04/17 0639 12/05/17 0633 12/06/17 0625 12/07/17 0453  WBC 7.6 6.2 8.0 7.2 6.4  NEUTROABS 3.9 3.1 4.2 4.0 3.9  HGB 10.8* 9.6* 10.9* 10.8* 9.7*  HCT 34.1* 30.5* 35.8* 35.0* 30.5*  MCV 113.3* 113.0* 112.6* 113.3* 110.9*  PLT 95* 73* 92* 70* 77*   Basic Metabolic Panel: Recent Labs  Lab 12/03/17 0656 12/04/17 0639 12/05/17 0633 12/06/17 0625 12/06/17 1313 12/07/17 0453 12/07/17 0915  NA 140 137 139 136  --  134* 131*  K 3.7 3.9 3.9 4.5  --  5.4* 5.5*  CL 101 102 104 99*  --  100* 97*  CO2 22 24 21* 23  --  20* 18*  GLUCOSE 145* 231* 126* 321* 377* 462* 567*  BUN 27* 16 26* 17  --  36* 42*  CREATININE 6.03* 3.98* 5.23* 4.25*  --  5.85* 6.18*  CALCIUM 9.7 8.5* 9.7 9.5  --  8.9 9.1  MG 2.0 1.7 1.9 1.8  --  1.7  --   PHOS 2.7 3.4 3.2 3.8  --  5.6*  --    GFR: Estimated Creatinine Clearance: 12.2 mL/min (A) (by C-G formula based on SCr of 6.18 mg/dL (H)). Liver Function Tests: Recent Labs  Lab 12/03/17 0656 12/04/17 0639 12/05/17 0633 12/06/17 0625 12/07/17 0453  AST 84* 78* 80* 78* 62*  ALT 14* 10* 16* 15* 13*  ALKPHOS 144* 132* 157* 164* 131*  BILITOT 8.0* 6.6* 7.6* 7.8* 6.3*  PROT 7.9 7.2 8.1 8.2* 6.6  ALBUMIN 4.4 3.7 4.2 4.0 3.4*   No results for input(s): LIPASE, AMYLASE in the last 168 hours. No results for input(s): AMMONIA in the last 168 hours. Coagulation Profile: No results for input(s): INR, PROTIME in the last 168 hours. Cardiac Enzymes: No results for input(s): CKTOTAL, CKMB, CKMBINDEX, TROPONINI in the last 168 hours. BNP (last 3 results) No results for input(s): PROBNP in the last 8760 hours. HbA1C: No results for input(s): HGBA1C in the last 72 hours.  CBG: Recent Labs  Lab 12/07/17 0754 12/07/17 1129 12/07/17 1243 12/07/17 1507 12/07/17 1644  GLUCAP 473* 570* 409* 281* 181*   Lipid  Profile: No results for input(s): CHOL, HDL, LDLCALC, TRIG, CHOLHDL, LDLDIRECT in the last 72 hours. Thyroid Function Tests: No results for input(s): TSH, T4TOTAL, FREET4, T3FREE, THYROIDAB in the last 72 hours. Anemia Panel: Recent Labs    12/06/17 0625  VITAMINB12 986*  FOLATE 28.0  FERRITIN 230  TIBC 98*  IRON 45  RETICCTPCT 1.7   Sepsis Labs: No results for input(s): PROCALCITON, LATICACIDVEN in the last 168 hours.  No results found for this or any previous visit (from the past 240 hour(s)).   Radiology Studies: No results found.  Scheduled Meds: . chlorhexidine  15 mL Mouth Rinse BID  . Chlorhexidine Gluconate Cloth  6 each Topical Daily  . famotidine  20 mg Oral QHS  . feeding supplement (ENSURE ENLIVE)  237 mL Oral TID BM  . feeding supplement (PRO-STAT SUGAR FREE 64)  30 mL Oral TID  . fentaNYL  12.5 mcg Transdermal Q72H  . folic acid  1 mg Oral Daily  . insulin aspart  0-20 Units Subcutaneous TID WC  . insulin aspart  0-5 Units Subcutaneous QHS  . insulin aspart  4 Units Subcutaneous TID WC  . insulin glargine  20 Units Subcutaneous QHS  . lactulose  20 g Per Tube TID   Or  . lactulose  300 mL Rectal TID  . lidocaine  1 patch Transdermal Q24H  . mouth rinse  15 mL Mouth Rinse q12n4p  . methocarbamol  500 mg Oral TID  . midodrine  10 mg Oral TID WC  . QUEtiapine  25 mg Oral QHS  . rifaximin  550 mg Oral BID  . sodium chloride flush  10-40 mL Intracatheter Q12H  . thiamine  100 mg Oral Daily   Continuous Infusions: . sodium chloride Stopped (11/20/17 0900)    LOS: 30 days   Merlene Laughter, DO [facility] Pager [phone]  If 7PM-7AM, please contact night-coverage [email or web address] Password [facility] 12/07/2017, 5:42 PM

## 2017-12-07 NOTE — Progress Notes (Addendum)
Patient ID: [name], male   DOB: 08/06/1961, 57 y.o.   MRN: [id number] [facility] Progress Note   Assessment/ Plan:   1. Anuric acute kidney injury, likely ESRD-on renal replacement therapy since 11/16/17 (CRRT transitioned to IHD) and process initiated for outpatient dialysis unit placement.  Based on the initial workup/assessment by Dr. Flossie DibbleWebb-he was determined to have ATN rather than HRS for which RRT was started with empiric treatment for HRS. He is not a candidate for liver transplant due to recent alcohol use prior to admission.  Seen by vascular surgery and he does not have any targets for AV fistula creation-soft blood pressures increasing apprehension for AVG creation at this time especially with questions regarding his prognosis/longevity on dialysis.  Albumin/octreotide stopped, continue midodrine for blood pressure support.  Continue hemodialysis on a TTS schedule.  Awaiting disposition. 2. Alcoholic cirrhosis with encephalopathy-not a candidate for liver transplantation referral at this time given recent alcohol use, albumin levels remain surprisingly elevated post infusions/hemodialysis.  He is bothered by the loose stools on lactulose. 3. Thrombocytopenia-secondary to cirrhosis/hypersplenism from portal hypertension.  No evidence of prolonged bleeding at present 4. Alcohol abuse-we educated regarding cessation from alcohol and need for follow-up with gastroenterology.  5. Disposition-appreciate input from palliative care service and the process for outpatient dialysis unit placement is underway.  Subjective:    Lots of anxiety today regarding dispo.     Objective:   BP 97/64 (BP Location: Right Arm)   Pulse 78   Temp 97.7 F (36.5 C) (Oral)   Resp 12   Ht 5\' 6"  (1.676 m)   Wt 70.8 kg (156 lb)   SpO2 100%   BMI 25.18 kg/m  No intake or output data in the 24 hours ending 12/07/17 1422 Weight change: 2.261 kg (4 lb 15.8 oz)  Physical Exam: Gen:  Comfortably resting in bed CVS: Pulse regular rhythm, ejection systolic murmur over apex Resp: Decreased breath sounds over bases, bibasilar crackles Abd: Soft, distended, nontender Ext: Trace-1+ bilateral lower extremity edema with dressings over shallow skin ulcers over left pretibial surface, some bruising and rubor ACCESS: R IJ TDC  Imaging: No results found.  Labs: BMET Recent Labs  Lab 12/01/17 0617  12/02/17 0551 12/03/17 0656 12/04/17 0639 12/05/17 0633 12/06/17 0625 12/06/17 1313 12/07/17 0453 12/07/17 0915  NA 139  --  138 140 137 139 136  --  134* 131*  K 4.0  --  3.4* 3.7 3.9 3.9 4.5  --  5.4* 5.5*  CL 103  --  98* 101 102 104 99*  --  100* 97*  CO2 22  --  24 22 24  21* 23  --  20* 18*  GLUCOSE 336*   < > 184* 145* 231* 126* 321* 377* 462* 567*  BUN 23*  --  17 27* 16 26* 17  --  36* 42*  CREATININE 5.82*  --  4.49* 6.03* 3.98* 5.23* 4.25*  --  5.85* 6.18*  CALCIUM 9.5  --  9.2 9.7 8.5* 9.7 9.5  --  8.9 9.1  PHOS 2.8  --   --  2.7 3.4 3.2 3.8  --  5.6*  --    < > = values in this interval not displayed.   CBC Recent Labs  Lab 12/04/17 0639 12/05/17 0633 12/06/17 0625 12/07/17 0453  WBC 6.2 8.0 7.2 6.4  NEUTROABS 3.1 4.2 4.0 3.9  HGB 9.6* 10.9* 10.8* 9.7*  HCT 30.5* 35.8* 35.0* 30.5*  MCV 113.0* 112.6* 113.3*  110.9*  PLT 73* 92* 70* 77*    Medications:    . chlorhexidine  15 mL Mouth Rinse BID  . Chlorhexidine Gluconate Cloth  6 each Topical Daily  . famotidine  20 mg Oral QHS  . feeding supplement (ENSURE ENLIVE)  237 mL Oral TID BM  . feeding supplement (PRO-STAT SUGAR FREE 64)  30 mL Oral TID  . fentaNYL  12.5 mcg Transdermal Q72H  . folic acid  1 mg Oral Daily  . insulin aspart  0-20 Units Subcutaneous TID WC  . insulin aspart  0-5 Units Subcutaneous QHS  . insulin aspart  4 Units Subcutaneous TID WC  . insulin glargine  20 Units Subcutaneous QHS  . lactulose  20 g Per Tube TID   Or  . lactulose  300 mL Rectal TID  . lidocaine  1 patch  Transdermal Q24H  . mouth rinse  15 mL Mouth Rinse q12n4p  . methocarbamol  500 mg Oral TID  . midodrine  10 mg Oral TID WC  . QUEtiapine  25 mg Oral QHS  . rifaximin  550 mg Oral BID  . sodium chloride flush  10-40 mL Intracatheter Q12H  . thiamine  100 mg Oral Daily   Bufford Buttner MD [phone] 12/07/2017, 2:22 PM

## 2017-12-07 NOTE — Progress Notes (Signed)
Rechecked Pt's CBG - notified Dr. Marland McalpineSheikh.  Per MD to give pt the 6 units and then recheck in 1 hour.

## 2017-12-07 NOTE — Progress Notes (Signed)
Date and time results received: 12/07/17  At 1005  Test: glucose  Critical Value: 567  Name of Provider Notified: De BurrsSheikh, O. MD  Orders Received? Or Actions Taken? Give 20 units, plus coverage, and then to recheck in 1 hour and if still elevated to give the 6 units

## 2017-12-07 NOTE — Progress Notes (Signed)
CBG is 409 - Paged Dr. Marland McalpineSheikh to advise him. Rosalita ChessmanSuzanne

## 2017-12-08 ENCOUNTER — Encounter (HOSPITAL_COMMUNITY): Payer: Self-pay

## 2017-12-08 LAB — CBC WITH DIFFERENTIAL/PLATELET
Basophils Absolute: 0 10*3/uL (ref 0.0–0.1)
Basophils Relative: 1 %
Eosinophils Absolute: 0.3 10*3/uL (ref 0.0–0.7)
Eosinophils Relative: 5 %
HEMATOCRIT: 30.6 % — AB (ref 39.0–52.0)
HEMOGLOBIN: 9.8 g/dL — AB (ref 13.0–17.0)
LYMPHS ABS: 2.2 10*3/uL (ref 0.7–4.0)
LYMPHS PCT: 31 %
MCH: 34 pg (ref 26.0–34.0)
MCHC: 32 g/dL (ref 30.0–36.0)
MCV: 106.3 fL — AB (ref 78.0–100.0)
MONO ABS: 0.6 10*3/uL (ref 0.1–1.0)
MONOS PCT: 8 %
NEUTROS ABS: 3.9 10*3/uL (ref 1.7–7.7)
NEUTROS PCT: 55 %
Platelets: 97 10*3/uL — ABNORMAL LOW (ref 150–400)
RBC: 2.88 MIL/uL — ABNORMAL LOW (ref 4.22–5.81)
RDW: 14.4 % (ref 11.5–15.5)
WBC: 7.1 10*3/uL (ref 4.0–10.5)

## 2017-12-08 LAB — COMPREHENSIVE METABOLIC PANEL
ALBUMIN: 3.5 g/dL (ref 3.5–5.0)
ALK PHOS: 155 U/L — AB (ref 38–126)
ALT: 16 U/L — ABNORMAL LOW (ref 17–63)
ANION GAP: 15 (ref 5–15)
AST: 74 U/L — ABNORMAL HIGH (ref 15–41)
BILIRUBIN TOTAL: 6.6 mg/dL — AB (ref 0.3–1.2)
BUN: 53 mg/dL — ABNORMAL HIGH (ref 6–20)
CALCIUM: 9.4 mg/dL (ref 8.9–10.3)
CO2: 19 mmol/L — ABNORMAL LOW (ref 22–32)
Chloride: 101 mmol/L (ref 101–111)
Creatinine, Ser: 7.15 mg/dL — ABNORMAL HIGH (ref 0.61–1.24)
GFR calc Af Amer: 9 mL/min — ABNORMAL LOW (ref >60)
GFR, EST NON AFRICAN AMERICAN: 8 mL/min — AB (ref >60)
GLUCOSE: 257 mg/dL — AB (ref 65–99)
Potassium: 5 mmol/L (ref 3.5–5.1)
Sodium: 135 mmol/L (ref 135–145)
TOTAL PROTEIN: 7.1 g/dL (ref 6.5–8.1)

## 2017-12-08 LAB — PHOSPHORUS: Phosphorus: 6.5 mg/dL — ABNORMAL HIGH (ref 2.5–4.6)

## 2017-12-08 LAB — GLUCOSE, CAPILLARY
GLUCOSE-CAPILLARY: 285 mg/dL — AB (ref 65–99)
GLUCOSE-CAPILLARY: 308 mg/dL — AB (ref 65–99)
GLUCOSE-CAPILLARY: 225 mg/dL — AB (ref 65–99)
Glucose-Capillary: 128 mg/dL — ABNORMAL HIGH (ref 65–99)

## 2017-12-08 LAB — MAGNESIUM: MAGNESIUM: 1.7 mg/dL (ref 1.7–2.4)

## 2017-12-08 MED ORDER — MIDODRINE HCL 5 MG PO TABS
ORAL_TABLET | ORAL | Status: AC
  Filled 2017-12-08: qty 2

## 2017-12-08 MED ORDER — PRO-STAT SUGAR FREE PO LIQD
30.0000 mL | Freq: Two times a day (BID) | ORAL | Status: DC
  Administered 2017-12-08 – 2017-12-13 (×9): 30 mL via ORAL
  Filled 2017-12-08 (×9): qty 30

## 2017-12-08 MED ORDER — INSULIN GLARGINE 100 UNIT/ML ~~LOC~~ SOLN
20.0000 [IU] | Freq: Every day | SUBCUTANEOUS | Status: DC
  Administered 2017-12-08 – 2017-12-09 (×2): 20 [IU] via SUBCUTANEOUS
  Filled 2017-12-08 (×3): qty 0.2

## 2017-12-08 NOTE — Progress Notes (Signed)
Nutrition Follow-up  DOCUMENTATION CODES:   Not applicable  INTERVENTION:   -D/c Ensure Enlive po TID, each supplement provides 350 kcal and 20 grams of protein -Decrease 30 ml Prostat BID, each supplement provides 100 kcals and 15 grams protein  NUTRITION DIAGNOSIS:   Inadequate oral intake related to lethargy/confusion as evidenced by meal completion < 50%.  Progressing  GOAL:   Patient will meet greater than or equal to 90% of their needs  Progressing  MONITOR:   PO intake, Supplement acceptance, Labs, I & O's  REASON FOR ASSESSMENT:   Ventilator, Consult Enteral/tube feeding initiation and management  ASSESSMENT:   57 yo male with PMH of GERD, osteoporosis, vitamin D deficiency, hepatomegaly, alcohol abuse, pancreatitis, DM, compression fracture, Legionnaire's DZ, PTSD who was admitted on 3/16 with decompensated cirrhosis with hepatic encephalopathy, hypoglycemia. Required intubation on 3/17.  Reviewed I/O's: -9.2 L since 11/24/17. Wt has been stable since last week.   Pt in HD at time of visit.   Intake has improved since last visit. Per RN, pt has been having hyperglycemic episodes. Per MD notes, pt has also been consuming outside food, such as rice krispie treats. Meal completion much improved; PO: 25-100%. Pt has been refusing Ensure supplements, however, taking Prostat. Will d/c Ensure supplements given improved oral intake and hyperglycemia.   Per MD notes, likely discharge sometime next week to home once outpatient HD slot available.   Medications reviewed and include folvite, vitamin B-1, and lactulose.   Labs reviewed: CBGS: 38-285 (inpstient orders for glycemic control are 0-20 units insulin aspart TID with meals, 0-5 units insulin aspart q HS, 4 units insulin aspart TID with meals, and 20 units insulin glargine q HS) , Phos: 6.5.   Diet Order:  Diet Carb Modified Fluid consistency: Thin; Room service appropriate? Yes  EDUCATION NEEDS:   No education  needs have been identified at this time  Skin:  Skin Assessment: Skin Integrity Issues: Skin Integrity Issues:: Other (Comment) Other: venous stasis/non pressure injury ulcers bilateral extremities  Last BM:  12/07/17  Height:   Ht Readings from Last 1 Encounters:  11/22/17 5\' 6"  (1.676 m)    Weight:   Wt Readings from Last 1 Encounters:  12/08/17 155 lb 3.3 oz (70.4 kg)    Ideal Body Weight:  70 kg  BMI:  Body mass index is 25.05 kg/m.  Estimated Nutritional Needs:   Kcal:  2100-2300  Protein:  115-130 gm  Fluid:  1.2 L    Jadia Capers A. Mayford KnifeWilliams, RD, LDN, CDE Pager: [phone] After hours Pager: [phone]

## 2017-12-08 NOTE — Care Management Note (Addendum)
Case Management Note  Patient Details  Name: [name] P Eustice MRN: [id number] Date of Birth: 12/03/1960  Subjective/Objective:                    Action/Plan:  Discussed discharge planning with patient at bedside and Rachael ( wife ) via phone [phone] . Confirmed [facility] address.  Patient has had [facility] in past and would like them again.   Patient has a Museum/gallery exhibitions officerolator and a shower chair at home already.  Patient does want wheelchair, and 3 in 1 .   Paged MD for orders for wheelchair, 3 in 1 and HHPT and face to face.   [facility] Expected Discharge Date:                  Expected Discharge Plan:  Home w Home Health Services  In-House Referral:     Discharge planning Services  CM Consult  Post Acute Care Choice:  Durable Medical Equipment, Home Health Choice offered to:  Patient, Spouse  DME Arranged:  Community education officerLightweight manual wheelchair with seat cushion, 3-N-1 DME Agency:  [facility]  HH Arranged:  PT, RN,aide, OT HH Agency:  [facility]  Status of Service:  Completed, signed off  If discussed at Long Length of Stay Meetings, dates discussed:    Additional Comments:  Kingsley PlanWile, Dewey Viens Marie, RN 12/08/2017, 1:56 PM

## 2017-12-08 NOTE — Plan of Care (Signed)
  Problem: Activity: Goal: Risk for activity intolerance will decrease Outcome: Progressing   Problem: Skin Integrity: Goal: Risk for impaired skin integrity will decrease Outcome: Progressing   Problem: Safety: Goal: Ability to remain free from injury will improve Outcome: Progressing   

## 2017-12-08 NOTE — Progress Notes (Signed)
Inpatient Diabetes Program Recommendations  AACE/ADA: New Consensus Statement on Inpatient Glycemic Control (2015)  Target Ranges:  Prepandial:   less than 140 mg/dL      Peak postprandial:   less than 180 mg/dL (1-2 hours)      Critically ill patients:  140 - 180 mg/dL   Results for [name], [name] (MRN [id number]) as of 12/08/2017 13:42  Ref. Range 12/07/2017 07:54 12/07/2017 11:29 12/07/2017 12:43 12/07/2017 15:07 12/07/2017 16:44 12/07/2017 21:53 12/07/2017 23:04 12/08/2017 04:59 12/08/2017 12:36  Glucose-Capillary Latest Ref Range: 65 - 99 mg/dL 528473 (H)  Novolog 20 units @10 :21  Novolog 4 units @ 10:15   570 (HH)  Novolog 6 units @ 11:32 409 (H)  Novolog 14 units @ 13:56 281 (H)   181 (H)  Novolog 8 units @ 17:51 38 (LL) 170 (H) 285 (H) 308 (H)  Novolog 19 units@ 12:56   Review of Glycemic Control  Diabetes history: DM2 Outpatient Diabetes medications: Lantus 37 units daily, Novolog 9-17 units TID with meals Current orders for Inpatient glycemic control: Lantus 20 units QHS, Novolog 4 units TID with meals, Novolog 0-20 units TID with meals  Inpatient Diabetes Program Recommendations: Insulin-Basal: NO Lantus given at bedtime on 12/07/17 (charted as refused) and no Lantus given on 12/06/17. Recommend changing Lantus frequency to Q24H (to start now). Insulin-Correction: Please consider decreasing Novolog correction to Novolog 0-15 units TID with meals.  NOTE: In reviewing chart, noted fasting glucose 473 mg/dl on 4/13/244/15/19 due to not getting Lantus on 12/06/17, not receiving any meal coverage with meal, and perhaps over treatment of hypoglycemia.  Then patient received a total of Novolog 44 units within 4 hours of each other (from 10:21 to 13:56 on 12/07/17). Glucose down to 38 mg/dl at 40:1021:53 on 2/72/534/15/19 likely from too much short acting insulin. Then glucose back up to 285 mg/dl at 6:644:59 am today and up to 308 mg/dl at 40:3412:36 today after HD.    Thanks, Orlando PennerMarie Doria Fern, RN, MSN, CDE Diabetes  Coordinator Inpatient Diabetes Program [phone] (Team Pager from 8am to 5pm)

## 2017-12-08 NOTE — Progress Notes (Signed)
Patient ID: [name], male   DOB: March 08, 1961, 57 y.o.   MRN: [id number]  [facility] Progress Note   Assessment/ Plan:   1. Anuric acute kidney injury, likely ESRD-on renal replacement therapy since 11/16/17 (CRRT transitioned to IHD) and process initiated for outpatient dialysis unit placement.  Based on the initial workup/assessment by Dr. Flossie Dibble was determined to have ATN rather than HRS for which RRT was started with empiric treatment for HRS. He is not a candidate for liver transplant due to recent alcohol use prior to admission.  Seen by vascular surgery and he does not have any targets for AV fistula creation and he has a risk of AVG clotting due to soft BP.  Albumin/octreotide stopped, continue midodrine for blood pressure support.  Continue hemodialysis on a TTS schedule.  Awaiting disposition and CLIP. 2. Alcoholic cirrhosis with encephalopathy-not a candidate for liver transplantation referral at this time given recent alcohol use, albumin levels remain surprisingly elevated post infusions/hemodialysis.  He is bothered by the loose stools on lactulose. 3. Thrombocytopenia-secondary to cirrhosis/hypersplenism from portal hypertension.  No evidence of prolonged bleeding at present 4. Alcohol abuse-we educated regarding cessation from alcohol and need for follow-up with gastroenterology.  5. Disposition-appreciate input from palliative care service and the process for outpatient dialysis unit placement is underway.  Subjective:    HD today.  No complaints.     Objective:   BP 99/68   Pulse 80   Temp (!) 97.5 F (36.4 C) (Oral)   Resp 16   Ht  (1.676 m)   Wt 70.4 kg (155 lb 3.3 oz)   SpO2 97%   BMI 25.05 kg/m   Intake/Output Summary (Last 24 hours) at 12/08/2017 1437 Last data filed at 12/08/2017 1300 Gross per 24 hour  Intake 120 ml  Output 1646 ml  Net -1526 ml   Weight change: 3.639 kg (8 lb 0.4 oz)  Physical Exam: Gen: Comfortably resting on  dialysis. CVS: Pulse regular rhythm, ejection systolic murmur over apex Resp: Decreased breath sounds over bases, bibasilar crackles Abd: Soft, distended, nontender Ext: Trace-1+ bilateral lower extremity edema with dressings over shallow skin ulcers over left pretibial surface, some bruising and rubor ACCESS: R IJ TDC  Imaging: No results found.  Labs: BMET Recent Labs  Lab 12/03/17 0656 12/04/17 4782 12/05/17 9562 12/06/17 0625 12/06/17 1313 12/07/17 0453 12/07/17 0915 12/08/17 0727  NA 140 137 139 136  --  134* 131* 135  K 3.7 3.9 3.9 4.5  --  5.4* 5.5* 5.0  CL 101 102 104 99*  --  100* 97* 101  CO2 22 24 21* 23  --  20* 18* 19*  GLUCOSE 145* 231* 126* 321* 377* 462* 567* 257*  BUN 27* 16 26* 17  --  36* 42* 53*  CREATININE 6.03* 3.98* 5.23* 4.25*  --  5.85* 6.18* 7.15*  CALCIUM 9.7 8.5* 9.7 9.5  --  8.9 9.1 9.4  PHOS 2.7 3.4 3.2 3.8  --  5.6*  --  6.5*   CBC Recent Labs  Lab 12/05/17 0633 12/06/17 0625 12/07/17 0453 12/08/17 0727  WBC 8.0 7.2 6.4 7.1  NEUTROABS 4.2 4.0 3.9 3.9  HGB 10.9* 10.8* 9.7* 9.8*  HCT 35.8* 35.0* 30.5* 30.6*  MCV 112.6* 113.3* 110.9* 106.3*  PLT 92* 70* 77* 97*    Medications:    . chlorhexidine  15 mL Mouth Rinse BID  . Chlorhexidine Gluconate Cloth  6 each Topical Daily  . famotidine  20 mg  Oral QHS  . feeding supplement (PRO-STAT SUGAR FREE 64)  30 mL Oral BID BM  . fentaNYL  12.5 mcg Transdermal Q72H  . folic acid  1 mg Oral Daily  . insulin aspart  0-20 Units Subcutaneous TID WC  . insulin aspart  0-5 Units Subcutaneous QHS  . insulin aspart  4 Units Subcutaneous TID WC  . insulin glargine  20 Units Subcutaneous QHS  . lactulose  20 g Per Tube TID   Or  . lactulose  300 mL Rectal TID  . lidocaine  1 patch Transdermal Q24H  . mouth rinse  15 mL Mouth Rinse q12n4p  . methocarbamol  500 mg Oral TID  . midodrine  10 mg Oral TID WC  . QUEtiapine  25 mg Oral QHS  . rifaximin  550 mg Oral BID  . sodium chloride flush  10-40  mL Intracatheter Q12H  . thiamine  100 mg Oral Daily   Bufford Buttner MD [phone] 12/08/2017, 2:37 PM

## 2017-12-08 NOTE — Progress Notes (Signed)
Physical Therapy Treatment Patient Details Name: [name] MRN: [id number] DOB: [birth year] Today's Date: 12/08/2017    History of Present Illness Pt adm with abd swelling and lethargy and found to have acute hepatic encephalopathy. Pt intubated for airway protection  3/17-3/20. Pt also with acute kidney injury. PMH - DM, PTSD, compression fx's and pain/ debilitation, GERD, EtOH abuse with cirrhosis    PT Comments    Pt concerned/ anxious about discharge details and that he won't be prepared when the time comes. Specifically in regards to initiating OP HD. Case mgmt has already spoken to him about this today but he did not seem to remember this. Pt fatigued after HD but able to ambulate 100' with SPC and min-guard A. He experiences back pain with ambulation, recommend continued use of rollator at home to be able to tolerate increased distances (he wanted to try cane to try to get stronger). PT will continue to follow.    Follow Up Recommendations  Home health PT;Supervision/Assistance - 24 hour     Equipment Recommendations  Wheelchair (measurements PT);3in1 (PT)    Recommendations for Other Services OT consult     Precautions / Restrictions Precautions Precautions: Fall Restrictions Weight Bearing Restrictions: No    Mobility  Bed Mobility               General bed mobility comments: received in chair  Transfers Overall transfer level: Needs assistance Equipment used: Straight cane Transfers: Sit to/from Stand Sit to Stand: Supervision         General transfer comment: use of AD upon standing  Ambulation/Gait Ambulation/Gait assistance: Supervision Ambulation Distance (Feet): 100 Feet Assistive device: Straight cane Gait Pattern/deviations: Step-through pattern;Decreased stride length;Trunk flexed Gait velocity: decreased Gait velocity interpretation: <1.8 ft/sec, indicate of risk for recurrent falls General Gait Details: pt able to use SPC but  frequently reaches for stable surfaces with L hand as well as having increased back pain with shorter distance than when using rollator per his report. For these reasons, expect he will do better with rollator to allow for increased safe activity with less back pain   Stairs             Wheelchair Mobility    Modified Rankin (Stroke Patients Only)       Balance Overall balance assessment: Needs assistance Sitting-balance support: No upper extremity supported;Feet supported Sitting balance-Leahy Scale: Normal   Postural control: Posterior lean Standing balance support: Single extremity supported;During functional activity Standing balance-Leahy Scale: Fair Standing balance comment: slowed reaction time to perturbation with posterior bias                            Cognition Arousal/Alertness: Awake/alert Behavior During Therapy: Anxious Overall Cognitive Status: Impaired/Different from baseline Area of Impairment: Memory                   Current Attention Level: Selective Memory: Decreased short-term memory         General Comments: case mgmt had gone over the process of getting started in OP HD but he did not remember that when working with me and wanted a full explanation again. He is overall anxious about discharge and the intiation of OP HD      Exercises      General Comments General comments (skin integrity, edema, etc.): fatigued after HD but still able to ambulate household distance      Pertinent Vitals/Pain Pain Assessment: Faces  Faces Pain Scale: Hurts even more Pain Location: back Pain Descriptors / Indicators: Aching Pain Intervention(s): Limited activity within patient's tolerance;Monitored during session    Home Living                      Prior Function            PT Goals (current goals can now be found in the care plan section) Acute Rehab PT Goals Patient Stated Goal: feel prepared before being  discharged PT Goal Formulation: With patient Time For Goal Achievement: 12/12/17 Potential to Achieve Goals: Good Progress towards PT goals: Progressing toward goals    Frequency    Min 3X/week      PT Plan Current plan remains appropriate;Frequency needs to be updated    Co-evaluation              AM-PAC PT "6 Clicks" Daily Activity  Outcome Measure  Difficulty turning over in bed (including adjusting bedclothes, sheets and blankets)?: None Difficulty moving from lying on back to sitting on the side of the bed? : A Little Difficulty sitting down on and standing up from a chair with arms (e.g., wheelchair, bedside commode, etc,.)?: A Little Help needed moving to and from a bed to chair (including a wheelchair)?: A Little Help needed walking in hospital room?: A Little Help needed climbing 3-5 steps with a railing? : A Little 6 Click Score: 19    End of Session Equipment Utilized During Treatment: Gait belt Activity Tolerance: Patient tolerated treatment well Patient left: with call bell/phone within reach;in chair Nurse Communication: Mobility status PT Visit Diagnosis: Unsteadiness on feet (R26.81);Other abnormalities of gait and mobility (R26.89);Muscle weakness (generalized) (M62.81)     Time: [id number] PT Time Calculation (min) (ACUTE ONLY): 36 min  Charges:  $Gait Training: 23-37 mins                    G Codes:       [name], PT  Acute Rehab Services  [phone]    [name] 12/08/2017, 4:13 PM

## 2017-12-08 NOTE — Progress Notes (Signed)
PROGRESS NOTE    [name]  ZOX:[id number] DOB: [birth year] DOA: 11/07/2017 PCP: Dettinger, Elige Radon, MD  Brief Narrative:  [name] is a 57 y.o. year old male with medical history significant for alcoholic cirrhosis, T2DM, PTSD, and chronic back pain who presented on 11/07/2017 with 2 weeks of abdominal swelling, worsening jaundice and somnolence and was found to have decompensated cirrhosis prompting admission to critical care for hepatic encephalopathy,hypoglycemia related to continued insulin therapy despite decreased PO intake, presumed SBP ( no paracentesis with insufficient ascites), INR coagulopathy and and eventually intubated (3/16) for inability to protect airway.    Prolonged hospital course included intubation on 3/16 (extubated 3/20) due to hypoxia and somnolence, empiric SBP therapy ( not enough asites to tap)recurrent hypoglycemia requiring IV dextrose, severe cellulitis of lower extremities treated with vancomycin during time in critical care, anuric AKI/ATN prompted discontinuation of Vancomyin( 3/20), empiric IV albumin/midodrine/octreotide treatment (3/23-4/8), CRRT and eventually transition to intermittent HD( 4/2). He was transferred to [facility] service on 11/23/17 for further management and has been undergoing intermittent HD. Palliative is on board as patient has poor prognosis and unclear if he can tolerate HD long term. VVS evaluated and may get an AVG on Friday however after Nephrology and Vascular discussed case will hold on AVG at this time and will continue Dialysis through [facility] and process for outpatient Dialysis placement started per Nephro.   Hospitalization has been complicated by uncontrolled hyperglycemia.  Upon further review is actually noted the patient has not been receiving his insulin doses appropriately and has been eating rice crispy treats and eating food otherwise provided to him refusing his Lantus dosing.  Diabetes education coronary consultation and  appreciate her assistance with evaluation and management of this patient.  Assessment & Plan:   Active Problems:   Hypoxia   Acute respiratory failure with hypoxia (HCC)   Cirrhosis (HCC)   Acute renal failure (ARF) (HCC)   Goals of care, counseling/discussion   Palliative care by specialist   ESRD (end stage renal disease) on dialysis (HCC)   Thrombocytopenia (HCC)   Alcohol abuse   Pain, generalized  Anuric Acute Renal Failure now on Dialysis TTS -Presumed hemodynamically mediated/ ischemic ATN ( hypotension from SBP, ?some element of vancomycin toxicity which he was on for severe cellulitis for his lower extremities, some prior concern for HRS and had trial of albumin, midodrine, octreotride 3/23-4/8) -CRRT 3/26-3/29, s/p tunneled cath placement on 4/5, - -Patient is Negative 9.280 L this admission -His BP has been tolerating intermittent HD this past week -C/w Midodrine 10 mg TIDwm (less for HRS and more helpful in BP on iHD), Octreotide 200 mcg sq BID, d/c'd albumin ( by nephro) -Poor prognosis given unclear patient can tolerate long term HD with his BP, palliative medicine following -C/w 12.5 mcg fentanyl patch to assist w/ back pain most prominent after HD sessions. We do NOT plan on discharging patient home on a fentanyl patch -BUN/Cr was 53/7.15 this AM  -Palliative involved for GOC; At this point patient wants to continue with Treatment after discussion with wife -Nephrology consulted and planning on continuing Dialysis -Vein Mapping done and he has an SVT in the Right Basilic Vein and Left Basilic Vein  -Vascular Surgery Consulted and only option is to do a a Left Arm AVG -AVG will not be pursued at this time because of Soft Blood Pressures and because of Prognosis and ? Longevity on Dialysis for Tolerance  -Appreciate Nephrology Recc's and they have started  search for outpatient Dialysis centers.  Awaiting CLIP placement  Decompensated Alcoholic cirrhosis with hepatic  encephalopathy, stable -Normal mentation and continues to remain w/o asterixis -Not a Candidate for a Liver Transplant.  -Continue Lactulose po 20-g TID or Lactulose Enema 300 mL RC TID and Rifaximin 550 mg po BID regimen. -Volume Management per Dialysis    -LFTs this a.m. showed an AST of 74, and ALT of 16 and a T bili of 6.6  Hyperbilirubinemia and Elevated AST -Likely related to Cirrhosis; T Bili now is 6.6 and AST now 74 -Hepatitis panel on admission negative  -Continue to Monitor CMP in AM   Thrombocytopenia -Fluctuating  -Secondary to cirrhosis/hypersplenism -Platelet Count now 97,0000 -Asymptomatic -Continue to Monitor for S/Sx of Bleeding and repeat CBC in AM   Anemia, likely of chronic disease given ESRD and chronic liver disease -Anemia is Macrocytic and MCV is 106.3 -Anemia Panel showed iron level 45, UIBC of 53, TIBC of 98, saturation ratios of 46, ferritin level 230, folate level 28.0, and vitamin B12 986. -Hb/Hct stable at 9.8/30.6 -Continue to Monitor for S/Sx of Bleeding -Repeat CBC in AM   Type 2 DM - Hemoglobin A1C 4.4 (11/2017), -On admission presented with hypoglycemia requiring IV dextrose as patient was still taking insulin despite no PO intake with Hepatic Encephalopathy - A1c 4.4%, likely hypersplenism? (portal hypertension associated with cirrhosis), no blood transfusions here, additionally 50 lb weight loss in 2 months maybe  -Elevated fasting glucose > 300 for several days -Less concern for hypoglycemia given patient is eating well -Continue Lantus to 20 units sq qHS, and increased Novolog Moderate Scale AC to Resistant SSI AC/HS -Increased Pre-meal Insulin to 4 units TID wm -Is been given additional NovoLog doses. -CBG's ranging from 38-308 today  -Diabetes Education Coordinator evaluated and it was discovered that patient has been refusing his Long Acting Lantus -Dietician re-consulted about education and has discontinuation of her left p.o. 3  times daily and decrease Pro-Stat to twice daily to 30 mL's  Alcohol Abuse - Discussed importance of cessation with family and patient.  -C/w Folic Acid 1 mg po Daily and with Thiamine 100 mg po Daily  -C/w Quetiapine 25 mg po qHS  -Palliative Discussed AA Meetings with the Patient -Appreciate Palliative Care Recc's    Acute Hypoxic Respiratory Failure, resolved -S/p 2 intubations during hospitalization likely related to inability to protect airway in setting of decompensated cirrhosis/hepatic encephalopathy and metabolic derangements from worsening renal failure earlier in admission. -Continue to encourage incentive spirometry and monitor Respiratory Status carefully  -Intermittent CXR as necessary   Acute on Chronic Back pain -Flare worsened by recent HD session, continue oxycodone PRN q4h severe pain, and robaxin.  Patient complained of severe pain today.   -C/w 12.5 mcg Fentanyl patch q72h added this admission, will closely monitor to ensure does not negatively affect blood pressure. -C/w Lidocaine Patch TD q24h as well  -Palliative to find out about outpatient Pain Management and Control and appreciate their Assistance  Hyponatremia, improved -Went from 131-135 -Likely was contracted in dialysis. -Repeat CMP in a.m.  Hyperkalemia -Patient's  K+ was 5.0 this a.m. -Likely to be corrected in dialysis. -Repeat CMP in a.m.  Hyperphosphatemia -Patient's Phos level was 6.5 this a.m. -Likely to be corrected in dialysis -Repeat PhosLo in a.m.  DVT prophylaxis: SCDs given Thrombocytopenia Code Status: Partial Code Family Communication: No family at bedside in Dialysis   Disposition Plan: Remain Inpatient and D/C will go home with Home Health PT with  RW and 3in1 when Dialysis Slot Available   Consultants:   Nephrology  Palliative Care Medicine  Vascular Surgery   Care Management    Procedures:  VEIN MAPPING Upper Extremity Vein Map  Right Cephalic: Not  visualized  Right Basilic  Segment Diameter Depth Comment  1. Axilla   Not visualized  2. Mid upper arm 2.72mm 19.74mm   3. Above [facility] 1.81mm 17.71mm   4. In [facility] 2.77mm 11.33mm Thrombosed  5. Below AC 1.18mm 18.59mm Thrombosed  6. Mid forearm 1.80mm 16.80mm Thrombosed  7. Wrist   Not visualized                  Left Cephalic  Segment Diameter Depth Comment  1. Axilla 1.44mm 10.58mm   2. Mid upper arm 0.80mm 3.22mm   3. Above [facility]   Not visualized  4. In [facility]   Not visualized  5. Below [facility]   Not visualized  6. Mid forearm   Not visualized  7. Wrist   Not visualized                  Left Basilic  Segment Diameter Depth Comment  1. Axilla 2.1mm 16.32mm Thrombosed  2. Mid upper arm 2.56mm 12.67mm Thrombosed  3. Above [facility] 2.93mm 12.41mm Thrombosed  4. In AC mm mm Not visualized  5. Below AC mm mm Not visualized  6. Mid forearm mm mm Not visualized  7. Wrist mm mm Not visualized   mm mm    mm mm    mm mm     Antimicrobials:  Anti-infectives (From admission, onward)   Start     Dose/Rate Route Frequency Ordered Stop   11/27/17 0800  ceFAZolin (ANCEF) IVPB 2g/100 mL premix     2 g 200 mL/hr over 30 Minutes Intravenous To Radiology 11/27/17 0747 11/27/17 1455   11/20/17 2200  rifaximin (XIFAXAN) tablet 550 mg     550 mg Oral 2 times daily 11/20/17 1004     11/17/17 1000  rifaximin (XIFAXAN) tablet 550 mg  Status:  Discontinued     550 mg Per Tube 2 times daily 11/17/17 0844 11/20/17 1004   11/16/17 2200  piperacillin-tazobactam (ZOSYN) IVPB 3.375 g  Status:  Discontinued     3.375 g 100 mL/hr over 30 Minutes Intravenous Every 6 hours 11/16/17 1902 11/19/17 1006   11/15/17 1400  piperacillin-tazobactam (ZOSYN) IVPB 3.375 g  Status:  Discontinued     3.375 g 12.5 mL/hr over 240 Minutes Intravenous Every 12 hours 11/15/17 1327 11/16/17 1902   11/11/17 2200  rifaximin (XIFAXAN) tablet 550 mg  Status:  Discontinued     550 mg  Oral 2 times daily 11/11/17 1611 11/17/17 0844   11/08/17 2200  vancomycin (VANCOCIN) 1,500 mg in sodium chloride 0.9 % 500 mL IVPB  Status:  Discontinued     1,500 mg 250 mL/hr over 120 Minutes Intravenous Every 12 hours 11/08/17 1140 11/09/17 1210   11/08/17 0900  vancomycin (VANCOCIN) 2,000 mg in sodium chloride 0.9 % 500 mL IVPB     2,000 mg 250 mL/hr over 120 Minutes Intravenous  Once 11/08/17 0858 11/08/17 1200   11/07/17 1800  cefTRIAXone (ROCEPHIN) 2 g in sodium chloride 0.9 % 100 mL IVPB     2 g 200 mL/hr over 30 Minutes Intravenous Every 24 hours 11/07/17 1753 11/14/17 1759     Subjective: Seen and examined sitting in dialysis in chair.  States he did have pain today.  No appreciable nausea,  vomiting, chest pain, or shortness of breath.  No other complaints or concerns at this time.  Objective: Vitals:   12/08/17 1100 12/08/17 1130 12/08/17 1134 12/08/17 1354  BP: 95/61 105/68 94/64 99/68   Pulse: 69 69 72 80  Resp:   18 16  Temp:   97.8 F (36.6 C) (!) 97.5 F (36.4 C)  TempSrc:   Oral Oral  SpO2:   98% 97%  Weight:   70.4 kg (155 lb 3.3 oz)   Height:        Intake/Output Summary (Last 24 hours) at 12/08/2017 1456 Last data filed at 12/08/2017 1300 Gross per 24 hour  Intake 120 ml  Output 1646 ml  Net -1526 ml   Filed Weights   12/08/17 0502 12/08/17 0730 12/08/17 1134  Weight: 74.4 kg (164 lb 0.4 oz) 72.2 kg (159 lb 2.8 oz) 70.4 kg (155 lb 3.3 oz)   Examination: Physical Exam:  Constitutional: Well-nourished, well-developed pleasant however chronically ill-appearing Caucasian male who is jaundiced sitting in dialysis chair in no acute distress Eyes: Sclera are icteric.  Lids are normal ENMT: Ears and nose appear normal. Neck: Supple with no JVD Respiratory: Diminished to auscultation bilaterally however patient has unlabored breathing.  No appreciable wheezing, rales, rhonchi.  Not wearing any supplemental O2 Cardiovascular: Regular rate and rhythm.  Slight  murmur trace lower extremity edema Abdomen: Soft, distended secondary body habitus and ascites.  Bowel sounds present all 4 quadrants.  Nontender to palpation today GU: Deferred Musculoskeletal: No contractures, no cyanosis Skin: Dry.  Has some lower extremity venous stasis changes and minor skin ulcerations of the left leg which are covered by Mepilex.  Patient is still jaundiced Neurologic: Cranial nerves II through XII grossly intact. Psychiatric: Depressed appearing mood.  Anxious and flat affect.  Awake and alert and oriented x3.  Data Reviewed: I have personally reviewed following labs and imaging studies  CBC: Recent Labs  Lab 12/04/17 0639 12/05/17 0633 12/06/17 0625 12/07/17 0453 12/08/17 0727  WBC 6.2 8.0 7.2 6.4 7.1  NEUTROABS 3.1 4.2 4.0 3.9 3.9  HGB 9.6* 10.9* 10.8* 9.7* 9.8*  HCT 30.5* 35.8* 35.0* 30.5* 30.6*  MCV 113.0* 112.6* 113.3* 110.9* 106.3*  PLT 73* 92* 70* 77* 97*   Basic Metabolic Panel: Recent Labs  Lab 12/04/17 0639 12/05/17 0633 12/06/17 0625 12/06/17 1313 12/07/17 0453 12/07/17 0915 12/08/17 0727  NA 137 139 136  --  134* 131* 135  K 3.9 3.9 4.5  --  5.4* 5.5* 5.0  CL 102 104 99*  --  100* 97* 101  CO2 24 21* 23  --  20* 18* 19*  GLUCOSE 231* 126* 321* 377* 462* 567* 257*  BUN 16 26* 17  --  36* 42* 53*  CREATININE 3.98* 5.23* 4.25*  --  5.85* 6.18* 7.15*  CALCIUM 8.5* 9.7 9.5  --  8.9 9.1 9.4  MG 1.7 1.9 1.8  --  1.7  --  1.7  PHOS 3.4 3.2 3.8  --  5.6*  --  6.5*   GFR: Estimated Creatinine Clearance: 10.5 mL/min (A) (by C-G formula based on SCr of 7.15 mg/dL (H)). Liver Function Tests: Recent Labs  Lab 12/04/17 0639 12/05/17 0633 12/06/17 0625 12/07/17 0453 12/08/17 0727  AST 78* 80* 78* 62* 74*  ALT 10* 16* 15* 13* 16*  ALKPHOS 132* 157* 164* 131* 155*  BILITOT 6.6* 7.6* 7.8* 6.3* 6.6*  PROT 7.2 8.1 8.2* 6.6 7.1  ALBUMIN 3.7 4.2 4.0 3.4* 3.5   No results  for input(s): LIPASE, AMYLASE in the last 168 hours. No results for  input(s): AMMONIA in the last 168 hours. Coagulation Profile: No results for input(s): INR, PROTIME in the last 168 hours. Cardiac Enzymes: No results for input(s): CKTOTAL, CKMB, CKMBINDEX, TROPONINI in the last 168 hours. BNP (last 3 results) No results for input(s): PROBNP in the last 8760 hours. HbA1C: No results for input(s): HGBA1C in the last 72 hours. CBG: Recent Labs  Lab 12/07/17 1644 12/07/17 2153 12/07/17 2304 12/08/17 0459 12/08/17 1236  GLUCAP 181* 38* 170* 285* 308*   Lipid Profile: No results for input(s): CHOL, HDL, LDLCALC, TRIG, CHOLHDL, LDLDIRECT in the last 72 hours. Thyroid Function Tests: No results for input(s): TSH, T4TOTAL, FREET4, T3FREE, THYROIDAB in the last 72 hours. Anemia Panel: Recent Labs    12/06/17 0625  VITAMINB12 986*  FOLATE 28.0  FERRITIN 230  TIBC 98*  IRON 45  RETICCTPCT 1.7   Sepsis Labs: No results for input(s): PROCALCITON, LATICACIDVEN in the last 168 hours.  No results found for this or any previous visit (from the past 240 hour(s)).   Radiology Studies: No results found.  Scheduled Meds: . chlorhexidine  15 mL Mouth Rinse BID  . Chlorhexidine Gluconate Cloth  6 each Topical Daily  . famotidine  20 mg Oral QHS  . feeding supplement (PRO-STAT SUGAR FREE 64)  30 mL Oral BID BM  . fentaNYL  12.5 mcg Transdermal Q72H  . folic acid  1 mg Oral Daily  . insulin aspart  0-20 Units Subcutaneous TID WC  . insulin aspart  0-5 Units Subcutaneous QHS  . insulin aspart  4 Units Subcutaneous TID WC  . insulin glargine  20 Units Subcutaneous Daily  . lactulose  20 g Per Tube TID   Or  . lactulose  300 mL Rectal TID  . lidocaine  1 patch Transdermal Q24H  . mouth rinse  15 mL Mouth Rinse q12n4p  . methocarbamol  500 mg Oral TID  . midodrine  10 mg Oral TID WC  . QUEtiapine  25 mg Oral QHS  . rifaximin  550 mg Oral BID  . sodium chloride flush  10-40 mL Intracatheter Q12H  . thiamine  100 mg Oral Daily   Continuous  Infusions: . sodium chloride Stopped (11/20/17 0900)    LOS: 31 days   Merlene Laughter, DO [facility] Pager [phone]  If 7PM-7AM, please contact night-coverage [email or web address] Password [facility] 12/08/2017, 2:56 PM

## 2017-12-09 DIAGNOSIS — Z9119 Patient's noncompliance with other medical treatment and regimen: Secondary | ICD-10-CM

## 2017-12-09 DIAGNOSIS — E1142 Type 2 diabetes mellitus with diabetic polyneuropathy: Secondary | ICD-10-CM

## 2017-12-09 DIAGNOSIS — E1165 Type 2 diabetes mellitus with hyperglycemia: Secondary | ICD-10-CM

## 2017-12-09 LAB — COMPREHENSIVE METABOLIC PANEL
ALBUMIN: 3.3 g/dL — AB (ref 3.5–5.0)
ALK PHOS: 153 U/L — AB (ref 38–126)
ALT: 16 U/L — ABNORMAL LOW (ref 17–63)
AST: 81 U/L — AB (ref 15–41)
Anion gap: 11 (ref 5–15)
BILIRUBIN TOTAL: 5.9 mg/dL — AB (ref 0.3–1.2)
BUN: 26 mg/dL — AB (ref 6–20)
CO2: 22 mmol/L (ref 22–32)
Calcium: 8.9 mg/dL (ref 8.9–10.3)
Chloride: 102 mmol/L (ref 101–111)
Creatinine, Ser: 5.06 mg/dL — ABNORMAL HIGH (ref 0.61–1.24)
GFR calc Af Amer: 14 mL/min — ABNORMAL LOW (ref >60)
GFR calc non Af Amer: 12 mL/min — ABNORMAL LOW (ref >60)
GLUCOSE: 132 mg/dL — AB (ref 65–99)
POTASSIUM: 3.8 mmol/L (ref 3.5–5.1)
Sodium: 135 mmol/L (ref 135–145)
TOTAL PROTEIN: 6.5 g/dL (ref 6.5–8.1)

## 2017-12-09 LAB — CBC WITH DIFFERENTIAL/PLATELET
BASOS ABS: 0.1 10*3/uL (ref 0.0–0.1)
BASOS PCT: 1 %
Eosinophils Absolute: 0.2 10*3/uL (ref 0.0–0.7)
Eosinophils Relative: 4 %
HEMATOCRIT: 30 % — AB (ref 39.0–52.0)
Hemoglobin: 9.4 g/dL — ABNORMAL LOW (ref 13.0–17.0)
Lymphocytes Relative: 36 %
Lymphs Abs: 2.4 10*3/uL (ref 0.7–4.0)
MCH: 33.3 pg (ref 26.0–34.0)
MCHC: 31.3 g/dL (ref 30.0–36.0)
MCV: 106.4 fL — ABNORMAL HIGH (ref 78.0–100.0)
Monocytes Absolute: 0.7 10*3/uL (ref 0.1–1.0)
Monocytes Relative: 11 %
NEUTROS ABS: 3.3 10*3/uL (ref 1.7–7.7)
Neutrophils Relative %: 48 %
Platelets: 75 10*3/uL — ABNORMAL LOW (ref 150–400)
RBC: 2.82 MIL/uL — ABNORMAL LOW (ref 4.22–5.81)
RDW: 14.5 % (ref 11.5–15.5)
WBC: 6.6 10*3/uL (ref 4.0–10.5)

## 2017-12-09 LAB — PHOSPHORUS: Phosphorus: 5.7 mg/dL — ABNORMAL HIGH (ref 2.5–4.6)

## 2017-12-09 LAB — GLUCOSE, CAPILLARY
GLUCOSE-CAPILLARY: 226 mg/dL — AB (ref 65–99)
GLUCOSE-CAPILLARY: 134 mg/dL — AB (ref 65–99)
Glucose-Capillary: 139 mg/dL — ABNORMAL HIGH (ref 65–99)
Glucose-Capillary: 261 mg/dL — ABNORMAL HIGH (ref 65–99)

## 2017-12-09 LAB — MAGNESIUM: Magnesium: 1.7 mg/dL (ref 1.7–2.4)

## 2017-12-09 MED ORDER — METHOCARBAMOL 500 MG PO TABS
500.0000 mg | ORAL_TABLET | Freq: Three times a day (TID) | ORAL | Status: DC | PRN
  Administered 2017-12-09 – 2017-12-10 (×2): 500 mg via ORAL
  Filled 2017-12-09 (×2): qty 1

## 2017-12-09 MED ORDER — GABAPENTIN 400 MG PO CAPS
1200.0000 mg | ORAL_CAPSULE | Freq: Three times a day (TID) | ORAL | Status: DC
  Administered 2017-12-09 – 2017-12-10 (×3): 1200 mg via ORAL
  Filled 2017-12-09 (×3): qty 3

## 2017-12-09 MED ORDER — LACTULOSE 10 GM/15ML PO SOLN
10.0000 g | Freq: Two times a day (BID) | ORAL | Status: DC
  Administered 2017-12-09 – 2017-12-10 (×3): 10 g via ORAL
  Filled 2017-12-09 (×8): qty 15

## 2017-12-09 MED ORDER — CEFAZOLIN SODIUM-DEXTROSE 1-4 GM/50ML-% IV SOLN
1.0000 g | INTRAVENOUS | Status: AC
  Administered 2017-12-10 (×2): 1 g via INTRAVENOUS
  Filled 2017-12-09: qty 50

## 2017-12-09 MED ORDER — OXYCODONE HCL 5 MG PO TABS
5.0000 mg | ORAL_TABLET | ORAL | Status: DC | PRN
  Administered 2017-12-09 – 2017-12-13 (×13): 5 mg via ORAL
  Filled 2017-12-09 (×11): qty 1

## 2017-12-09 NOTE — Progress Notes (Signed)
Patient ID: [name], male   DOB: 04/25/1961, 57 y.o.   MRN: [id number] [name] Progress Note   Assessment/ Plan:    1. Anuric acute kidney injury, likely ESRD-on renal replacement therapy since 11/16/17 (CRRT transitioned to IHD) and process initiated for outpatient dialysis unit placement.  Based on the initial workup/assessment by Dr. Flossie [name]-he was determined to have ATN rather than HRS for which RRT was started with empiric treatment for HRS. He is not a candidate for liver transplant due to recent alcohol use prior to admission.  Seen by vascular surgery and he does not have any targets for AV fistula creation and he has a risk of AVG clotting due to soft BP--> on the other hand, [facility] infection risk and with ESLD he's effectively immunocompromised.  Appreciate VVS seeing pt, for AVG placement tomorrow 4/18.  Albumin/octreotide stopped, continue midodrine for blood pressure support.  Continue hemodialysis on a TTS schedule.  Awaiting disposition and CLIP, he may have to go to a [facility] unit  2. Alcoholic cirrhosis with encephalopathy-not a candidate for liver transplantation referral at this time given recent alcohol use, albumin levels remain surprisingly elevated post infusions/hemodialysis.  He is bothered by the loose stools on lactulose.  3. Thrombocytopenia-secondary to cirrhosis/hypersplenism from portal hypertension.  No evidence of prolonged bleeding at present  4. Alcohol abuse-we educated regarding cessation from alcohol and need for follow-up with gastroenterology.   5. Disposition-appreciate input from palliative care service and the process for outpatient dialysis unit placement is underway.  Subjective:    HD today.  No complaints.     Objective:   BP 96/67 (BP Location: Right Arm)   Pulse 67   Temp 98.4 F (36.9 C) (Oral)   Resp 16   Ht 5\' 6"  (1.676 m)   Wt 72.3 kg (159 lb 6.3 oz)   SpO2 99%   BMI 25.73 kg/m   Intake/Output Summary  (Last 24 hours) at 12/09/2017 1533 Last data filed at 12/09/2017 1400 Gross per 24 hour  Intake 350 ml  Output -  Net 350 ml   Weight change: -2.2 kg (-4 lb 13.6 oz)  Physical Exam: Gen: Sitting in chair, eating lunch CVS: Pulse regular rhythm, ejection systolic murmur over apex Resp: Decreased breath sounds over bases, bibasilar crackles Abd: Soft, distended, nontender Ext: trace LE edema ACCESS: R IJ TDC  Imaging: No results found.  Labs: BMET Recent Labs  Lab 12/03/17 0656 12/04/17 [id number] 12/05/17 [id number] 12/06/17 0625 12/06/17 1313 12/07/17 0453 12/07/17 0915 12/08/17 0727 12/09/17 0435  NA 140 137 139 136  --  134* 131* 135 135  K 3.7 3.9 3.9 4.5  --  5.4* 5.5* 5.0 3.8  CL 101 102 104 99*  --  100* 97* 101 102  CO2 22 24 21* 23  --  20* 18* 19* 22  GLUCOSE 145* 231* 126* 321* 377* 462* 567* 257* 132*  BUN 27* 16 26* 17  --  36* 42* 53* 26*  CREATININE 6.03* 3.98* 5.23* 4.25*  --  5.85* 6.18* 7.15* 5.06*  CALCIUM 9.7 8.5* 9.7 9.5  --  8.9 9.1 9.4 8.9  PHOS 2.7 3.4 3.2 3.8  --  5.6*  --  6.5* 5.7*   CBC Recent Labs  Lab 12/06/17 0625 12/07/17 0453 12/08/17 0727 12/09/17 0435  WBC 7.2 6.4 7.1 6.6  NEUTROABS 4.0 3.9 3.9 3.3  HGB 10.8* 9.7* 9.8* 9.4*  HCT 35.0* 30.5* 30.6* 30.0*  MCV 113.3* 110.9* 106.3*  106.4*  PLT 70* 77* 97* 75*    Medications:    . feeding supplement (PRO-STAT SUGAR FREE 64)  30 mL Oral BID BM  . fentaNYL  12.5 mcg Transdermal Q72H  . gabapentin  1,200 mg Oral TID  . insulin aspart  0-20 Units Subcutaneous TID WC  . insulin aspart  0-5 Units Subcutaneous QHS  . insulin aspart  4 Units Subcutaneous TID WC  . insulin glargine  20 Units Subcutaneous Daily  . lactulose  10 g Oral BID  . lidocaine  1 patch Transdermal Q24H  . mouth rinse  15 mL Mouth Rinse q12n4p  . midodrine  10 mg Oral TID WC  . rifaximin  550 mg Oral BID  . sodium chloride flush  10-40 mL Intracatheter Q12H  . thiamine  100 mg Oral Daily   Bufford Buttner  MD [phone] 12/09/2017, 3:33 PM

## 2017-12-09 NOTE — Progress Notes (Signed)
PROGRESS NOTE    [name]   ZOX:[id number]  DOB: 02/04/1961  DOA: 3/16/2019Faythe [name] PCP: Dettinger, Elige RadonJoshua A, MD   Brief Narrative:  [name] GheeAaron P [name] 57 y.o.year old malewith medical history significant for alcoholic cirrhosis, T2DM, PTSD, and chronic back pain who presented on 3/16/2019with 2 weeks of abdominal swelling, worsening jaundice and somnolence and was found to have decompensated cirrhosis. He was intubated for airway protection and extubated on 3/20. He had a complicated hospital course : hypoxia and somnolence, empiric SBP therapy ( not enough asites to tap), recurrent hypoglycemia requiring IV dextrose, severe cellulitis of lower extremities treated with vancomycin, anuric AKI/ATN prompted discontinuation of Vancomyin  (3/20), empiric IV albumin/midodrine/octreotide treatment (3/23-4/8), CRRT and eventually transition to intermittent HD( 4/2).  [facility]  The patient has poor prognosis and unclear if he can tolerate HD long term and thus palliative care was consulted.  Subjective: He has no complaints for me but when I try to examine his legs he complains of severe pain when I remove his socl due to neuropathy    Assessment & Plan:   Active Problems:   Decompensated alcoholic cirrhosis, Hepatic encephalopathy, Elevated LFTs, Thrombocytopenia -Not a Candidate for a Liver Transplant - Hepatitis panel negative - cont Lactulose and Rifaximin - follow platelets - has been educated in regards to ETOH abuse  AKI now on permanent dialysis/ hypotension - suspected to be ATN and not HRS - CRRT 3/26-3/29, s/p tunneled cath placement on 4/5, - - Palliative involved for GOC- At this point patient wants to continue with treatment   - Vein Mapping done and he has an SVT in the Right Basilic Vein and Left Basilic Vein  - Seen by vascular surgery and he does not have any targets for AV fistula creation and he has a risk of AVG clotting due to low BP-  cont Midodrine - Appreciate Nephrology Recc's and they have started search for outpatient Dialysis centers - dialysis days TTS via right IJ TDC  Macrocytic Anemia, likely of chronic disease and alcohol abuse - has sufficient Iron< folate and B12 stores  Type 2 DM uncontrolled - Hemoglobin A1C4.4 (11/2017) - sugars very erratic in the hospital and raging from very low to 300s - Diabetes Education Coordinator evaluated and it was discovered that patient has been refusing his Long Acting Lantus - he has also been eating simple carbs such as rice crispy treats - cont Lantus and Novolog  Neuropathy - diabetic and possibly alcoholic - resume Neurontin- pharmacy to dose   Chronic back Pain? - [place] [facility] [place] [facility]- last prescribed 25 tabs on 3/7 and prior to this prescribed 35 tabs on 2/7  - currently on Fentanyl 12.5mg  and using Oxycdone 10mg  2-3 times a day- will need to start weaning-    NON COMPLIANCE WITH MEDS AND DIET  - Intermittently refusing to take medications   Note:  d/c Seroquel, Folate, Peridex (started 3/28), CHG wipes (started 3/25), Cut back on Lactulose from 30 gm TID to 10 BID and follow I and O  DVT prophylaxis: Heparin Code Status: Partial- OK with NIPPV and Pressors Family Communication:  Disposition Plan: await Clipping, PT recommend HHPT with 24 hr supervision, wheelchair and 3 in 1 Consultants:   Nephrology  Palliative Care Medicine  Vascular Surgery   Procedures:   Intubation  CRRT  Dialysis cath  Vein mapping Antimicrobials:  Anti-infectives (From admission, onward)   Start     Dose/Rate Route Frequency  Ordered Stop   12/10/17 0600  ceFAZolin (ANCEF) IVPB 1 g/50 mL premix    Note to Pharmacy:  Send with pt to OR   1 g 100 mL/hr over 30 Minutes Intravenous To Surgery 12/09/17 1117 12/11/17 0600   11/27/17 0800  ceFAZolin (ANCEF) IVPB 2g/100 mL premix     2 g 200 mL/hr over 30 Minutes Intravenous To Radiology 11/27/17 0747 11/27/17 1455     11/20/17 2200  rifaximin (XIFAXAN) tablet 550 mg     550 mg Oral 2 times daily 11/20/17 1004     11/17/17 1000  rifaximin (XIFAXAN) tablet 550 mg  Status:  Discontinued     550 mg Per Tube 2 times daily 11/17/17 0844 11/20/17 1004   11/16/17 2200  piperacillin-tazobactam (ZOSYN) IVPB 3.375 g  Status:  Discontinued     3.375 g 100 mL/hr over 30 Minutes Intravenous Every 6 hours 11/16/17 1902 11/19/17 1006   11/15/17 1400  piperacillin-tazobactam (ZOSYN) IVPB 3.375 g  Status:  Discontinued     3.375 g 12.5 mL/hr over 240 Minutes Intravenous Every 12 hours 11/15/17 1327 11/16/17 1902   11/11/17 2200  rifaximin (XIFAXAN) tablet 550 mg  Status:  Discontinued     550 mg Oral 2 times daily 11/11/17 1611 11/17/17 0844   11/08/17 2200  vancomycin (VANCOCIN) 1,500 mg in sodium chloride 0.9 % 500 mL IVPB  Status:  Discontinued     1,500 mg 250 mL/hr over 120 Minutes Intravenous Every 12 hours 11/08/17 1140 11/09/17 1210   11/08/17 0900  vancomycin (VANCOCIN) 2,000 mg in sodium chloride 0.9 % 500 mL IVPB     2,000 mg 250 mL/hr over 120 Minutes Intravenous  Once 11/08/17 0858 11/08/17 1200   11/07/17 1800  cefTRIAXone (ROCEPHIN) 2 g in sodium chloride 0.9 % 100 mL IVPB     2 g 200 mL/hr over 30 Minutes Intravenous Every 24 hours 11/07/17 1753 11/14/17 1759       Objective: Vitals:   12/08/17 1354 12/08/17 2051 12/09/17 0515 12/09/17 0600  BP: 99/68 96/60 (!) 87/57 103/61  Pulse: 80 92 (!) 59 75  Resp: 16 16 16    Temp: (!) 97.5 F (36.4 C) 97.8 F (36.6 C) 99.3 F (37.4 C)   TempSrc: Oral Oral Oral   SpO2: 97% 99% 98%   Weight:   72.3 kg (159 lb 6.3 oz)   Height:       No intake or output data in the 24 hours ending 12/09/17 1359 Filed Weights   12/08/17 0730 12/08/17 1134 12/09/17 0515  Weight: 72.2 kg (159 lb 2.8 oz) 70.4 kg (155 lb 3.3 oz) 72.3 kg (159 lb 6.3 oz)    Examination: General exam: Appears comfortable  HEENT: PERRLA, oral mucosa moist, no sclera icterus or  thrush Respiratory system: Clear to auscultation. Respiratory effort normal. Cardiovascular system: S1 & S2 heard, RRR.   Gastrointestinal system: Abdomen soft, non-tender, nondistended. Normal bowel sound. No organomegaly Central nervous system: Alert and oriented. No focal neurological deficits. Extremities: difficult to pull down socks to examine lower legs and feet due to pain Psychiatry:  Mood & affect appropriate.     Data Reviewed: I have personally reviewed following labs and imaging studies  CBC: Recent Labs  Lab 12/05/17 0633 12/06/17 0625 12/07/17 0453 12/08/17 0727 12/09/17 0435  WBC 8.0 7.2 6.4 7.1 6.6  NEUTROABS 4.2 4.0 3.9 3.9 3.3  HGB 10.9* 10.8* 9.7* 9.8* 9.4*  HCT 35.8* 35.0* 30.5* 30.6* 30.0*  MCV 112.6* 113.3*  110.9* 106.3* 106.4*  PLT 92* 70* 77* 97* 75*   Basic Metabolic Panel: Recent Labs  Lab 12/05/17 0633 12/06/17 0625 12/06/17 1313 12/07/17 0453 12/07/17 0915 12/08/17 0727 12/09/17 0435  NA 139 136  --  134* 131* 135 135  K 3.9 4.5  --  5.4* 5.5* 5.0 3.8  CL 104 99*  --  100* 97* 101 102  CO2 21* 23  --  20* 18* 19* 22  GLUCOSE 126* 321* 377* 462* 567* 257* 132*  BUN 26* 17  --  36* 42* 53* 26*  CREATININE 5.23* 4.25*  --  5.85* 6.18* 7.15* 5.06*  CALCIUM 9.7 9.5  --  8.9 9.1 9.4 8.9  MG 1.9 1.8  --  1.7  --  1.7 1.7  PHOS 3.2 3.8  --  5.6*  --  6.5* 5.7*   GFR: Estimated Creatinine Clearance: 14.9 mL/min (A) (by C-G formula based on SCr of 5.06 mg/dL (H)). Liver Function Tests: Recent Labs  Lab 12/05/17 [phone] 12/06/17 0625 12/07/17 0453 12/08/17 0727 12/09/17 0435  AST 80* 78* 62* 74* 81*  ALT 16* 15* 13* 16* 16*  ALKPHOS 157* 164* 131* 155* 153*  BILITOT 7.6* 7.8* 6.3* 6.6* 5.9*  PROT 8.1 8.2* 6.6 7.1 6.5  ALBUMIN 4.2 4.0 3.4* 3.5 3.3*   No results for input(s): LIPASE, AMYLASE in the last 168 hours. No results for input(s): AMMONIA in the last 168 hours. Coagulation Profile: No results for input(s): INR, PROTIME in the  last 168 hours. Cardiac Enzymes: No results for input(s): CKTOTAL, CKMB, CKMBINDEX, TROPONINI in the last 168 hours. BNP (last 3 results) No results for input(s): PROBNP in the last 8760 hours. HbA1C: No results for input(s): HGBA1C in the last 72 hours. CBG: Recent Labs  Lab 12/08/17 1236 12/08/17 1648 12/08/17 2150 12/09/17 0809 12/09/17 1232  GLUCAP 308* 128* 225* 139* 226*   Lipid Profile: No results for input(s): CHOL, HDL, LDLCALC, TRIG, CHOLHDL, LDLDIRECT in the last 72 hours. Thyroid Function Tests: No results for input(s): TSH, T4TOTAL, FREET4, T3FREE, THYROIDAB in the last 72 hours. Anemia Panel: No results for input(s): VITAMINB12, FOLATE, FERRITIN, TIBC, IRON, RETICCTPCT in the last 72 hours. Urine analysis:    Component Value Date/Time   COLORURINE YELLOW 11/11/2017 1205   APPEARANCEUR TURBID (A) 11/11/2017 1205   LABSPEC 1.025 11/11/2017 1205   PHURINE 6.5 11/11/2017 1205   GLUCOSEU NEGATIVE 11/11/2017 1205   HGBUR LARGE (A) 11/11/2017 1205   BILIRUBINUR LARGE (A) 11/11/2017 1205   BILIRUBINUR n 02/28/2011 1247   KETONESUR NEGATIVE 11/11/2017 1205   PROTEINUR >300 (A) 11/11/2017 1205   UROBILINOGEN 1.0 01/17/2015 1305   NITRITE NEGATIVE 11/11/2017 1205   LEUKOCYTESUR MODERATE (A) 11/11/2017 1205   Sepsis Labs: @LABRCNTIP (procalcitonin:4,lacticidven:4) )No results found for this or any previous visit (from the past 240 hour(s)).       Radiology Studies: No results found.    Scheduled Meds: . chlorhexidine  15 mL Mouth Rinse BID  . Chlorhexidine Gluconate Cloth  6 each Topical Daily  . famotidine  20 mg Oral QHS  . feeding supplement (PRO-STAT SUGAR FREE 64)  30 mL Oral BID BM  . fentaNYL  12.5 mcg Transdermal Q72H  . folic acid  1 mg Oral Daily  . insulin aspart  0-20 Units Subcutaneous TID WC  . insulin aspart  0-5 Units Subcutaneous QHS  . insulin aspart  4 Units Subcutaneous TID WC  . insulin glargine  20 Units Subcutaneous Daily  .  lactulose  20 g Per Tube TID   Or  . lactulose  300 mL Rectal TID  . lidocaine  1 patch Transdermal Q24H  . mouth rinse  15 mL Mouth Rinse q12n4p  . methocarbamol  500 mg Oral TID  . midodrine  10 mg Oral TID WC  . QUEtiapine  25 mg Oral QHS  . rifaximin  550 mg Oral BID  . sodium chloride flush  10-40 mL Intracatheter Q12H  . thiamine  100 mg Oral Daily   Continuous Infusions: . sodium chloride Stopped (11/20/17 0900)  . [START ON 12/10/2017]  ceFAZolin (ANCEF) IV       LOS: 32 days    Time spent in minutes: 50 min    Calvert Cantor, MD [facility] Pager: [email or web address] Password [email or web address] 12/09/2017, 1:59 PM

## 2017-12-09 NOTE — H&P (View-Only) (Signed)
  Progress Note    12/09/2017 6:07 PM * No surgery date entered *  Subjective: He does not have any complaints at this time  Vitals:   12/09/17 0600 12/09/17 1400  BP: 103/61 96/67  Pulse: 75 67  Resp:    Temp:  98.4 F (36.9 C)  SpO2:  99%    Physical Exam: Awake alert and oriented Palpable radial pulses bilaterally  CBC    Component Value Date/Time   WBC 6.6 12/09/2017 0435   RBC 2.82 (L) 12/09/2017 0435   HGB 9.4 (L) 12/09/2017 0435   HGB 13.1 10/22/2017 1542   HCT 30.0 (L) 12/09/2017 0435   HCT 40.3 10/22/2017 1542   PLT 75 (L) 12/09/2017 0435   PLT 107 (L) 10/22/2017 1542   MCV 106.4 (H) 12/09/2017 0435   MCV 110 (H) 10/22/2017 1542   MCH 33.3 12/09/2017 0435   MCHC 31.3 12/09/2017 0435   RDW 14.5 12/09/2017 0435   RDW 15.1 10/22/2017 1542   LYMPHSABS 2.4 12/09/2017 0435   LYMPHSABS 1.4 10/22/2017 1542   MONOABS 0.7 12/09/2017 0435   EOSABS 0.2 12/09/2017 0435   EOSABS 0.0 10/22/2017 1542   BASOSABS 0.1 12/09/2017 0435   BASOSABS 0.1 10/22/2017 1542    BMET    Component Value Date/Time   NA 135 12/09/2017 0435   NA 137 10/22/2017 1542   K 3.8 12/09/2017 0435   CL 102 12/09/2017 0435   CO2 22 12/09/2017 0435   GLUCOSE 132 (H) 12/09/2017 0435   BUN 26 (H) 12/09/2017 0435   BUN 5 (L) 10/22/2017 1542   CREATININE 5.06 (H) 12/09/2017 0435   CALCIUM 8.9 12/09/2017 0435   CALCIUM (LL) 04/07/2007 1517    5.5 Result repeated and verified. CRITICAL RESULT CALLED TO, READ BACK BY AND VERIFIED WITH: HUDGSON,M. RN 04/07/07 1031 WAYK CORRECTED ON 08/15 AT 0024: PREVIOUSLY REPORTED AS 5.5 Result repeated and verified.   GFRNONAA 12 (L) 12/09/2017 0435   GFRAA 14 (L) 12/09/2017 0435    INR    Component Value Date/Time   INR 1.64 11/27/2017 0830     Intake/Output Summary (Last 24 hours) at 12/09/2017 1807 Last data filed at 12/09/2017 1400 Gross per 24 hour  Intake 350 ml  Output -  Net 350 ml     Assessment:  56 y.o. male with end-stage renal  disease on dialysis via right IJ tunneled catheter.  He is now in need of permanent access.  Plan: I discussed with him the risk benefits and alternatives of proceeding with left arm AV graft given that he is right-handed.  His wife was present for the entire conversation.  At this time he wants to proceed knowing that graft may not work given his ongoing hypotension and he is okay with this.  We will check labs including INR in the morning.  He is n.p.o. after midnight.  No sticks or blood pressures in the left arm moving forward.  Joahan Swatzell C. Lauren Aguayo, MD [facility] [place] Office: [phone] Pager: [phone]  12/09/2017 6:07 PM  

## 2017-12-09 NOTE — Progress Notes (Signed)
Patient refused to take medications at this time.Will notify RN when ready.

## 2017-12-09 NOTE — Progress Notes (Signed)
Patient continued to refuse medication.

## 2017-12-09 NOTE — Progress Notes (Signed)
  Progress Note    12/09/2017 6:07 PM * No surgery date entered *  Subjective: He does not have any complaints at this time  Vitals:   12/09/17 0600 12/09/17 1400  BP: 103/61 96/67  Pulse: 75 67  Resp:    Temp:  98.4 F (36.9 C)  SpO2:  99%    Physical Exam: Awake alert and oriented Palpable radial pulses bilaterally  CBC    Component Value Date/Time   WBC 6.6 12/09/2017 0435   RBC 2.82 (L) 12/09/2017 0435   HGB 9.4 (L) 12/09/2017 0435   HGB 13.1 10/22/2017 1542   HCT 30.0 (L) 12/09/2017 0435   HCT 40.3 10/22/2017 1542   PLT 75 (L) 12/09/2017 0435   PLT 107 (L) 10/22/2017 1542   MCV 106.4 (H) 12/09/2017 0435   MCV 110 (H) 10/22/2017 1542   MCH 33.3 12/09/2017 0435   MCHC 31.3 12/09/2017 0435   RDW 14.5 12/09/2017 0435   RDW 15.1 10/22/2017 1542   LYMPHSABS 2.4 12/09/2017 0435   LYMPHSABS 1.4 10/22/2017 1542   MONOABS 0.7 12/09/2017 0435   EOSABS 0.2 12/09/2017 0435   EOSABS 0.0 10/22/2017 1542   BASOSABS 0.1 12/09/2017 0435   BASOSABS 0.1 10/22/2017 1542    BMET    Component Value Date/Time   NA 135 12/09/2017 0435   NA 137 10/22/2017 1542   K 3.8 12/09/2017 0435   CL 102 12/09/2017 0435   CO2 22 12/09/2017 0435   GLUCOSE 132 (H) 12/09/2017 0435   BUN 26 (H) 12/09/2017 0435   BUN 5 (L) 10/22/2017 1542   CREATININE 5.06 (H) 12/09/2017 0435   CALCIUM 8.9 12/09/2017 0435   CALCIUM (LL) 04/07/2007 1517    5.5 Result repeated and verified. CRITICAL RESULT CALLED TO, READ BACK BY AND VERIFIED WITH: HUDGSON,M. RN 04/07/07 1031 WAYK CORRECTED ON 08/15 AT 0024: PREVIOUSLY REPORTED AS 5.5 Result repeated and verified.   GFRNONAA 12 (L) 12/09/2017 0435   GFRAA 14 (L) 12/09/2017 0435    INR    Component Value Date/Time   INR 1.64 11/27/2017 0830     Intake/Output Summary (Last 24 hours) at 12/09/2017 1807 Last data filed at 12/09/2017 1400 Gross per 24 hour  Intake 350 ml  Output -  Net 350 ml     Assessment:  57 y.o. male with end-stage renal  disease on dialysis via right IJ tunneled catheter.  He is now in need of permanent access.  Plan: I discussed with him the risk benefits and alternatives of proceeding with left arm AV graft given that he is right-handed.  His wife was present for the entire conversation.  At this time he wants to proceed knowing that graft may not work given his ongoing hypotension and he is okay with this.  We will check labs including INR in the morning.  He is n.p.o. after midnight.  No sticks or blood pressures in the left arm moving forward.  Ladelle Teodoro C. Randie Heinzain, MD [facility] Office: [phone] Pager: [phone]  12/09/2017 6:07 PM

## 2017-12-09 NOTE — Progress Notes (Signed)
Physical Therapy Treatment Patient Details Name: [name] MRN: [id number] DOB: 03/19/1961 Today's Date: 12/09/2017    History of Present Illness Pt adm with abd swelling and lethargy and found to have acute hepatic encephalopathy. Pt intubated for airway protection  3/17-3/20. Pt also with acute kidney injury. PMH - DM, PTSD, compression fx's and pain/ debilitation, GERD, EtOH abuse with cirrhosis    PT Comments    Continuing work on functional mobility and activity tolerance;  Ambulating in hallways well; he is concerned about toelrating sitting up in HD chair for dialysis; worth considering getting a geomat cushion for him; Today as a non-HD day, and he is able to walk the entire unit well -- will need to look at his activity tolerance after HD (walked 100 ft yesterday), as well as stairs after HD   Follow Up Recommendations  Home health PT;Supervision/Assistance - 24 hour     Equipment Recommendations  Wheelchair (measurements PT);3in1 (PT)    Recommendations for Other Services       Precautions / Restrictions Precautions Precautions: Fall    Mobility  Bed Mobility                  Transfers Overall transfer level: Needs assistance Equipment used: Rolling walker (2 wheeled) Transfers: Sit to/from Stand Sit to Stand: Supervision         General transfer comment: use of AD upon standing  Ambulation/Gait Ambulation/Gait assistance: Supervision Ambulation Distance (Feet): 550 Feet Assistive device: Rolling walker (2 wheeled) Gait Pattern/deviations: Step-through pattern;Decreased stride length;Trunk flexed     General Gait Details: Cues to self-monitor for activity tolerance   Stairs             Wheelchair Mobility    Modified Rankin (Stroke Patients Only)       Balance             Standing balance-Leahy Scale: Fair                              Cognition Arousal/Alertness: Awake/alert Behavior During Therapy: WFL  for tasks assessed/performed(becomes anxious when thinking about discharge) Overall Cognitive Status: Within Functional Limits for tasks assessed(for simple mobility) Area of Impairment: Memory                                      Exercises      General Comments        Pertinent Vitals/Pain Pain Assessment: Faces Faces Pain Scale: Hurts a little bit Pain Location: back Pain Descriptors / Indicators: Aching Pain Intervention(s): Monitored during session    Home Living                      Prior Function            PT Goals (current goals can now be found in the care plan section) Acute Rehab PT Goals Patient Stated Goal: feel prepared before being discharged PT Goal Formulation: With patient Time For Goal Achievement: 12/12/17 Potential to Achieve Goals: Good Progress towards PT goals: Progressing toward goals    Frequency    Min 3X/week      PT Plan Current plan remains appropriate;Frequency needs to be updated    Co-evaluation              AM-PAC PT "6 Clicks" Daily Activity  Outcome Measure  Difficulty turning over in bed (including adjusting bedclothes, sheets and blankets)?: None Difficulty moving from lying on back to sitting on the side of the bed? : None Difficulty sitting down on and standing up from a chair with arms (e.g., wheelchair, bedside commode, etc,.)?: A Little Help needed moving to and from a bed to chair (including a wheelchair)?: A Little Help needed walking in hospital room?: A Little Help needed climbing 3-5 steps with a railing? : A Little 6 Click Score: 20    End of Session Equipment Utilized During Treatment: Gait belt Activity Tolerance: Patient tolerated treatment well Patient left: with call bell/phone within reach;in chair Nurse Communication: Mobility status PT Visit Diagnosis: Unsteadiness on feet (R26.81);Other abnormalities of gait and mobility (R26.89);Muscle weakness (generalized)  (M62.81)     Time: 1610-9604 PT Time Calculation (min) (ACUTE ONLY): 28 min  Charges:  $Gait Training: 23-37 mins                    G Codes:       Van Clines, [place]  Acute Rehabilitation Services Pager [phone] Office [phone]    Levi Aland 12/09/2017, 4:40 PM

## 2017-12-10 ENCOUNTER — Inpatient Hospital Stay (HOSPITAL_COMMUNITY): Payer: Medicare HMO

## 2017-12-10 ENCOUNTER — Encounter (HOSPITAL_COMMUNITY): Payer: Self-pay

## 2017-12-10 ENCOUNTER — Encounter (HOSPITAL_COMMUNITY)
Admission: EM | Disposition: A | Discharge status: Home / Self Care | Payer: Self-pay | Source: Home / Self Care | Attending: Internal Medicine

## 2017-12-10 HISTORY — PX: AV FISTULA PLACEMENT: SHX1204

## 2017-12-10 LAB — BASIC METABOLIC PANEL
Anion gap: 11 (ref 5–15)
BUN: 38 mg/dL — ABNORMAL HIGH (ref 6–20)
CHLORIDE: 103 mmol/L (ref 101–111)
CO2: 22 mmol/L (ref 22–32)
CREATININE: 6.63 mg/dL — AB (ref 0.61–1.24)
Calcium: 8.9 mg/dL (ref 8.9–10.3)
GFR calc non Af Amer: 8 mL/min — ABNORMAL LOW (ref >60)
GFR, EST AFRICAN AMERICAN: 10 mL/min — AB (ref >60)
Glucose, Bld: 153 mg/dL — ABNORMAL HIGH (ref 65–99)
POTASSIUM: 4.1 mmol/L (ref 3.5–5.1)
SODIUM: 136 mmol/L (ref 135–145)

## 2017-12-10 LAB — GLUCOSE, CAPILLARY
GLUCOSE-CAPILLARY: 215 mg/dL — AB (ref 65–99)
GLUCOSE-CAPILLARY: 252 mg/dL — AB (ref 65–99)
Glucose-Capillary: 172 mg/dL — ABNORMAL HIGH (ref 65–99)
Glucose-Capillary: 220 mg/dL — ABNORMAL HIGH (ref 65–99)

## 2017-12-10 LAB — CBC
HCT: 30.1 % — ABNORMAL LOW (ref 39.0–52.0)
Hemoglobin: 9.6 g/dL — ABNORMAL LOW (ref 13.0–17.0)
MCH: 34 pg (ref 26.0–34.0)
MCHC: 31.9 g/dL (ref 30.0–36.0)
MCV: 106.7 fL — AB (ref 78.0–100.0)
PLATELETS: 90 10*3/uL — AB (ref 150–400)
RBC: 2.82 MIL/uL — AB (ref 4.22–5.81)
RDW: 14.4 % (ref 11.5–15.5)
WBC: 7.2 10*3/uL (ref 4.0–10.5)

## 2017-12-10 LAB — PROTIME-INR
INR: 1.54
Prothrombin Time: 18.4 seconds — ABNORMAL HIGH (ref 11.4–15.2)

## 2017-12-10 SURGERY — INSERTION OF ARTERIOVENOUS (AV) GORE-TEX GRAFT ARM
Anesthesia: Monitor Anesthesia Care | Site: Arm Lower | Laterality: Left

## 2017-12-10 MED ORDER — FENTANYL CITRATE (PF) 250 MCG/5ML IJ SOLN
INTRAMUSCULAR | Status: DC | PRN
  Administered 2017-12-10: 25 ug via INTRAVENOUS

## 2017-12-10 MED ORDER — INSULIN GLARGINE 100 UNIT/ML ~~LOC~~ SOLN
26.0000 [IU] | Freq: Every day | SUBCUTANEOUS | Status: DC
  Administered 2017-12-11 – 2017-12-13 (×3): 26 [IU] via SUBCUTANEOUS
  Filled 2017-12-10 (×3): qty 0.26

## 2017-12-10 MED ORDER — INSULIN ASPART 100 UNIT/ML ~~LOC~~ SOLN: 9.0000 [IU] | Freq: Four times a day (QID) | SUBCUTANEOUS | Status: DC

## 2017-12-10 MED ORDER — GUAIFENESIN ER 600 MG PO TB12
1200.0000 mg | ORAL_TABLET | Freq: Two times a day (BID) | ORAL | Status: DC
  Administered 2017-12-10 (×2): 1200 mg via ORAL
  Filled 2017-12-10 (×3): qty 2

## 2017-12-10 MED ORDER — ALBUTEROL SULFATE (2.5 MG/3ML) 0.083% IN NEBU: 3.0000 mL | INHALATION_SOLUTION | Freq: Four times a day (QID) | RESPIRATORY_TRACT | Status: DC | PRN

## 2017-12-10 MED ORDER — ONDANSETRON HCL 4 MG/2ML IJ SOLN
INTRAMUSCULAR | Status: AC
  Filled 2017-12-10: qty 2

## 2017-12-10 MED ORDER — ACETAMINOPHEN 325 MG PO TABS
ORAL_TABLET | ORAL | Status: AC
  Filled 2017-12-10: qty 2

## 2017-12-10 MED ORDER — OXYCODONE HCL 5 MG PO TABS: 5.0000 mg | ORAL_TABLET | Freq: Once | ORAL | Status: DC | PRN

## 2017-12-10 MED ORDER — LIDOCAINE-EPINEPHRINE (PF) 1 %-1:200000 IJ SOLN
INTRAMUSCULAR | Status: AC
  Filled 2017-12-10: qty 30

## 2017-12-10 MED ORDER — FENTANYL CITRATE (PF) 250 MCG/5ML IJ SOLN
INTRAMUSCULAR | Status: AC
Start: 2017-12-10 — End: 2017-12-10
  Filled 2017-12-10: qty 5

## 2017-12-10 MED ORDER — MIDODRINE HCL 5 MG PO TABS
ORAL_TABLET | ORAL | Status: AC
  Administered 2017-12-10: 10 mg via ORAL
  Filled 2017-12-10: qty 2

## 2017-12-10 MED ORDER — OXYCODONE HCL 5 MG PO TABS
ORAL_TABLET | ORAL | Status: AC
  Administered 2017-12-10: 10 mg via ORAL
  Filled 2017-12-10: qty 2

## 2017-12-10 MED ORDER — PROPOFOL 10 MG/ML IV BOLUS
INTRAVENOUS | Status: DC | PRN
  Administered 2017-12-10: 20 mg via INTRAVENOUS

## 2017-12-10 MED ORDER — CARBOXYMETHYLCELLULOSE SODIUM 0.5 % OP SOLN: 1.0000 [drp] | Freq: Three times a day (TID) | OPHTHALMIC | Status: DC | PRN

## 2017-12-10 MED ORDER — LIDOCAINE HCL (PF) 1 % IJ SOLN
INTRAMUSCULAR | Status: AC
  Filled 2017-12-10: qty 30

## 2017-12-10 MED ORDER — ONDANSETRON HCL 4 MG/2ML IJ SOLN: 4.0000 mg | Freq: Four times a day (QID) | INTRAMUSCULAR | Status: DC | PRN

## 2017-12-10 MED ORDER — MORPHINE SULFATE 15 MG PO TABS
15.0000 mg | ORAL_TABLET | Freq: Three times a day (TID) | ORAL | Status: DC
Start: 2017-12-10 — End: 2017-12-10

## 2017-12-10 MED ORDER — THROMBIN 20000 UNITS EX SOLR
CUTANEOUS | Status: AC
  Filled 2017-12-10: qty 20000

## 2017-12-10 MED ORDER — SODIUM CHLORIDE 0.9 % IV SOLN
INTRAVENOUS | Status: DC | PRN
  Administered 2017-12-10: 500 mL

## 2017-12-10 MED ORDER — SODIUM CHLORIDE 0.9 % IV SOLN
INTRAVENOUS | Status: AC
  Filled 2017-12-10: qty 1.2

## 2017-12-10 MED ORDER — CEFAZOLIN SODIUM 1 G IJ SOLR
INTRAMUSCULAR | Status: AC
Start: 2017-12-10 — End: 2017-12-10
  Filled 2017-12-10: qty 10

## 2017-12-10 MED ORDER — ONDANSETRON HCL 4 MG/2ML IJ SOLN
INTRAMUSCULAR | Status: DC | PRN
  Administered 2017-12-10: 4 mg via INTRAVENOUS

## 2017-12-10 MED ORDER — POLYETHYLENE GLYCOL 3350 17 G PO PACK
17.0000 g | PACK | Freq: Two times a day (BID) | ORAL | Status: DC
  Filled 2017-12-10 (×2): qty 1

## 2017-12-10 MED ORDER — PROPOFOL 500 MG/50ML IV EMUL
INTRAVENOUS | Status: DC | PRN
  Administered 2017-12-10: 100 ug/kg/min via INTRAVENOUS
  Administered 2017-12-10: 75 ug/kg/min via INTRAVENOUS

## 2017-12-10 MED ORDER — 0.9 % SODIUM CHLORIDE (POUR BTL) OPTIME
TOPICAL | Status: DC | PRN
  Administered 2017-12-10: 1000 mL

## 2017-12-10 MED ORDER — OXYCODONE HCL 5 MG/5ML PO SOLN: 5.0000 mg | Freq: Once | ORAL | Status: DC | PRN

## 2017-12-10 MED ORDER — METHOCARBAMOL 500 MG PO TABS: 500.0000 mg | ORAL_TABLET | Freq: Two times a day (BID) | ORAL | Status: DC | PRN

## 2017-12-10 MED ORDER — OXYCODONE HCL 5 MG PO TABS
5.0000 mg | ORAL_TABLET | Freq: Once | ORAL | Status: AC
  Administered 2017-12-10: 5 mg via ORAL

## 2017-12-10 MED ORDER — FENTANYL CITRATE (PF) 100 MCG/2ML IJ SOLN: 25.0000 ug | INTRAMUSCULAR | Status: DC | PRN

## 2017-12-10 MED ORDER — ACCU-CHEK AVIVA PLUS W/DEVICE KIT: 1.0000 | PACK | Freq: Every day | Status: DC

## 2017-12-10 MED ORDER — OXYCODONE HCL 5 MG PO TABS
10.0000 mg | ORAL_TABLET | Freq: Once | ORAL | Status: AC
  Administered 2017-12-10: 10 mg via ORAL

## 2017-12-10 MED ORDER — BLOOD GLUCOSE TEST VI STRP: 1.0000 | ORAL_STRIP | Freq: Four times a day (QID) | Status: DC

## 2017-12-10 MED ORDER — MIDAZOLAM HCL 2 MG/2ML IJ SOLN
INTRAMUSCULAR | Status: AC
  Filled 2017-12-10: qty 2

## 2017-12-10 MED ORDER — HEPARIN SODIUM (PORCINE) 1000 UNIT/ML IJ SOLN
INTRAMUSCULAR | Status: DC | PRN
  Administered 2017-12-10: 6000 [IU] via INTRAVENOUS

## 2017-12-10 MED ORDER — OXYCODONE-ACETAMINOPHEN 5-325 MG PO TABS: 1.0000 | ORAL_TABLET | ORAL | Status: DC | PRN

## 2017-12-10 MED ORDER — MAGNESIUM 500 MG PO TABS: 500.0000 mg | ORAL_TABLET | Freq: Every day | ORAL | Status: DC

## 2017-12-10 MED ORDER — OXYCODONE HCL 5 MG PO TABS
ORAL_TABLET | ORAL | Status: AC
  Administered 2017-12-10: 5 mg via ORAL
  Filled 2017-12-10: qty 2

## 2017-12-10 MED ORDER — IBUPROFEN 600 MG PO TABS: 600.0000 mg | ORAL_TABLET | Freq: Four times a day (QID) | ORAL | Status: DC | PRN

## 2017-12-10 MED ORDER — LIDOCAINE-EPINEPHRINE (PF) 1 %-1:200000 IJ SOLN
INTRAMUSCULAR | Status: DC | PRN
  Administered 2017-12-10 (×2): 30 mL

## 2017-12-10 MED ORDER — PANTOPRAZOLE SODIUM 40 MG PO TBEC
40.0000 mg | DELAYED_RELEASE_TABLET | Freq: Every day | ORAL | Status: DC
Start: 2017-12-10 — End: 2017-12-11
  Administered 2017-12-10: 40 mg via ORAL
  Filled 2017-12-10: qty 1

## 2017-12-10 MED ORDER — PROTAMINE SULFATE 10 MG/ML IV SOLN
INTRAVENOUS | Status: AC
  Filled 2017-12-10: qty 25

## 2017-12-10 MED ORDER — MIDAZOLAM HCL 5 MG/5ML IJ SOLN
INTRAMUSCULAR | Status: DC | PRN
  Administered 2017-12-10: 2 mg via INTRAVENOUS

## 2017-12-10 MED ORDER — PROPOFOL 10 MG/ML IV BOLUS
INTRAVENOUS | Status: AC
Start: 2017-12-10 — End: 2017-12-10
  Filled 2017-12-10: qty 20

## 2017-12-10 MED ORDER — PROTAMINE SULFATE 10 MG/ML IV SOLN
INTRAVENOUS | Status: DC | PRN
  Administered 2017-12-10: 40 mg via INTRAVENOUS

## 2017-12-10 MED ORDER — INSULIN GLARGINE 100 UNIT/ML ~~LOC~~ SOLN: 20.0000 [IU] | Freq: Every day | SUBCUTANEOUS | Status: DC

## 2017-12-10 MED ORDER — HYDROCHLOROTHIAZIDE 25 MG PO TABS
12.5000 mg | ORAL_TABLET | Freq: Two times a day (BID) | ORAL | Status: DC
  Administered 2017-12-10: 12.5 mg via ORAL
  Filled 2017-12-10: qty 1

## 2017-12-10 MED ORDER — INSULIN GLARGINE 100 UNIT/ML ~~LOC~~ SOLN
33.0000 [IU] | Freq: Every day | SUBCUTANEOUS | Status: DC
  Administered 2017-12-10: 33 [IU] via SUBCUTANEOUS
  Filled 2017-12-10 (×2): qty 0.33

## 2017-12-10 SURGICAL SUPPLY — 39 items
ADH SKN CLS APL DERMABOND .7 (GAUZE/BANDAGES/DRESSINGS) ×2
ARMBAND PINK RESTRICT EXTREMIT (MISCELLANEOUS) ×4 IMPLANT
CANISTER SUCT 3000ML PPV (MISCELLANEOUS) ×3 IMPLANT
CANNULA VESSEL 3MM 2 BLNT TIP (CANNULA) ×2 IMPLANT
CLIP VESOCCLUDE MED 6/CT (CLIP) ×3 IMPLANT
CLIP VESOCCLUDE SM WIDE 6/CT (CLIP) ×3 IMPLANT
DERMABOND ADVANCED (GAUZE/BANDAGES/DRESSINGS) ×4
DERMABOND ADVANCED .7 DNX12 (GAUZE/BANDAGES/DRESSINGS) ×1 IMPLANT
ELECT REM PT RETURN 9FT ADLT (ELECTROSURGICAL) ×3
ELECTRODE REM PT RTRN 9FT ADLT (ELECTROSURGICAL) ×1 IMPLANT
GLOVE BIO SURGEON STRL SZ7.5 (GLOVE) ×3 IMPLANT
GLOVE BIOGEL PI IND STRL 6.5 (GLOVE) IMPLANT
GLOVE BIOGEL PI IND STRL 8 (GLOVE) IMPLANT
GLOVE BIOGEL PI INDICATOR 6.5 (GLOVE) ×8
GLOVE BIOGEL PI INDICATOR 8 (GLOVE) ×2
GLOVE ECLIPSE 6.5 STRL STRAW (GLOVE) ×2 IMPLANT
GOWN STRL REUS W/ TWL LRG LVL3 (GOWN DISPOSABLE) ×2 IMPLANT
GOWN STRL REUS W/ TWL XL LVL3 (GOWN DISPOSABLE) ×1 IMPLANT
GOWN STRL REUS W/TWL LRG LVL3 (GOWN DISPOSABLE) ×12
GOWN STRL REUS W/TWL XL LVL3 (GOWN DISPOSABLE)
GRAFT GORETEX STRT 4-7X45 (Vascular Products) ×2 IMPLANT
HEMOSTAT SNOW SURGICEL 2X4 (HEMOSTASIS) IMPLANT
INSERT FOGARTY SM (MISCELLANEOUS) ×1 IMPLANT
KIT BASIN OR (CUSTOM PROCEDURE TRAY) ×3 IMPLANT
KIT TURNOVER KIT B (KITS) ×3 IMPLANT
NS IRRIG 1000ML POUR BTL (IV SOLUTION) ×3 IMPLANT
PACK CV ACCESS (CUSTOM PROCEDURE TRAY) ×3 IMPLANT
PAD ARMBOARD 7.5X6 YLW CONV (MISCELLANEOUS) ×6 IMPLANT
SUT GORETEX 6.0 TH-9 30 IN (SUTURE) IMPLANT
SUT GORETEX CV-6TTC-13 36IN (SUTURE) IMPLANT
SUT MNCRL AB 4-0 PS2 18 (SUTURE) IMPLANT
SUT PROLENE 6 0 BV (SUTURE) ×6 IMPLANT
SUT SILK 2 0 SH (SUTURE) IMPLANT
SUT VIC AB 3-0 SH 27 (SUTURE) ×6
SUT VIC AB 3-0 SH 27X BRD (SUTURE) ×2 IMPLANT
SYR TOOMEY 50ML (SYRINGE) IMPLANT
TOWEL GREEN STERILE (TOWEL DISPOSABLE) ×3 IMPLANT
UNDERPAD 30X30 (UNDERPADS AND DIAPERS) ×3 IMPLANT
WATER STERILE IRR 1000ML POUR (IV SOLUTION) ×3 IMPLANT

## 2017-12-10 NOTE — Discharge Instructions (Signed)
° °  [facility]  Discharge Instructions  AV Fistula or Graft Surgery for Dialysis Access  Please refer to the following instructions for your post-procedure care. Your surgeon or physician assistant will discuss any changes with you.  Activity  You may drive the day following your surgery, if you are comfortable and no longer taking prescription pain medication. Resume full activity as the soreness in your incision resolves.  Bathing/Showering  You may shower after you go home. Keep your incision dry for 48 hours. Do not soak in a bathtub, hot tub, or swim until the incision heals completely. You may not shower if you have a hemodialysis catheter.  Incision Care  Clean your incision with mild soap and water after 48 hours. Pat the area dry with a clean towel. You do not need a bandage unless otherwise instructed. Do not apply any ointments or creams to your incision. You may have skin glue on your incision. Do not peel it off. It will come off on its own in about one week. Your arm may swell a bit after surgery. To reduce swelling use pillows to elevate your arm so it is above your heart. Your doctor will tell you if you need to lightly wrap your arm with an ACE bandage.  Diet  Resume your normal diet. There are not special food restrictions following this procedure. In order to heal from your surgery, it is CRITICAL to get adequate nutrition. Your body requires vitamins, minerals, and protein. Vegetables are the best source of vitamins and minerals. Vegetables also provide the perfect balance of protein. Processed food has little nutritional value, so try to avoid this.  Medications  Resume taking all of your medications. If your incision is causing pain, you may take over-the counter pain relievers such as acetaminophen (Tylenol). If you were prescribed a stronger pain medication, please be aware these medications can cause nausea and constipation. Prevent  nausea by taking the medication with a snack or meal. Avoid constipation by drinking plenty of fluids and eating foods with high amount of fiber, such as fruits, vegetables, and grains.  Do not take Tylenol if you are taking prescription pain medications.  Follow up Your surgeon may want to see you in the office following your access surgery. If so, this will be arranged at the time of your surgery.  Please call us immediately for any of the following conditions:  Increased pain, redness, drainage (pus) from your incision site Fever of 101 degrees or higher Severe or worsening pain at your incision site Hand pain or numbness.  Reduce your risk of vascular disease:  Stop smoking. If you would like help, call [facility] at 1-800-QUIT-NOW ((201)581-16541-862 372 3075) or [place] at 215 021 [phone]  Manage your cholesterol Maintain a desired weight Control your diabetes Keep your blood pressure down  Dialysis  It will take several weeks to several months for your new dialysis access to be ready for use. Your surgeon will determine when it is okay to use it. Your nephrologist will continue to direct your dialysis. You can continue to use your Permcath until your new access is ready for use.   12/10/2017 Faythe Gheearon P Pecore [id number] 06/30/1961  Surgeon(s): Chuck Hintickson, Christopher S, MD  Procedure(s): INSERTION OF ARTERIOVENOUS (AV) GORE-TEX GRAFT LEFT  ARM  x Do not stick graft for 4 weeks    If you have any questions, please call the office at [phone].

## 2017-12-10 NOTE — Op Note (Signed)
    NAME: [name]    MRN: [id number] DOB: 02/21/1961    DATE OF OPERATION: 12/10/2017  PREOP DIAGNOSIS:    End-stage renal disease  POSTOP DIAGNOSIS:    Same  PROCEDURE:    New left forearm AV graft (4-7 mm PTFE graft)  SURGEON: Di Kindlehristopher S. Edilia Boickson, MD, FACS  ASSIST: Vernona RiegerLaura PA student  ANESTHESIA: Local with sedation  EBL: Minimal  INDICATIONS:    [name] is a 57 y.o. male who presents for new access.  He has a functioning catheter.  FINDINGS:   4 mm antecubital vein  TECHNIQUE:   The patient was taken to the operating room and sedated by anesthesia.  The left arm was prepped and draped in usual sterile fashion.  After the skin was anesthetized with 1% lidocaine, a longitudinal incision was made just above the antecubital level.  Here the brachial vein was dissected free and was a very reasonable size vein.  The brachial artery was dissected free on the medial side.  Using 1 distal counterincision, after the skin was anesthetized, a 4-7 mm PTFE graft was tunneled in a loop fashion in the forearm.  The arterial aspect of the graft was along the ulnar aspect of the forearm.  The venous aspect of the graft was along the radial aspect of the forearm.  The patient was heparinized.  The brachial artery was clamped proximally and distally and a longitudinal arteriotomy was made.  A segment of the 4 mm of the graft was excised, the graft slightly spatulated and sewn end to side to the brachial artery using continuous 6-0 Prolene suture.  The graft then pulled the appropriate length for anastomosis to the brachial vein.  The vein was ligated distally and spatulated proximally.  The graft cut to the appropriate length, spatulated and sewn into into the vein using continuous 6-0 Prolene suture.  At the completion there was a good thrill in the fistula.  There was a radial and ulnar signal with a Doppler.  Hemostasis was obtained in the wound.  The heparin was partially  reversed with protamine.  The wound was closed with a deep layer of 3-0 Vicryl the skin closed with 4-0 Vicryl.  Dermabond was applied.  The patient tolerated the procedure well was transferred to the recovery room in stable condition.  All needle and sponge counts were correct.  Waverly Ferrarihristopher Michalene Debruler, MD, FACS [facility]  DATE OF DICTATION:   12/10/2017

## 2017-12-10 NOTE — Interval H&P Note (Signed)
History and Physical Interval Note:  12/10/2017 8:54 AM  [name]  has presented today for surgery, with the diagnosis of end stage renal disease  The various methods of treatment have been discussed with the patient and family. After consideration of risks, benefits and other options for treatment, the patient has consented to  Procedure(s): INSERTION OF ARTERIOVENOUS (AV) GORE-TEX GRAFT ARM (Left) as a surgical intervention .  The patient's history has been reviewed, patient examined, no change in status, stable for surgery.  I have reviewed the patient's chart and labs.  Questions were answered to the patient's satisfaction.     [name]

## 2017-12-10 NOTE — Progress Notes (Signed)
Patient ID: [name], male   DOB: 06/13/1961, 57 y.o.   MRN: [id number] [facility] Progress Note   Assessment/ Plan:    1. Anuric acute kidney injury, likely ESRD-on renal replacement therapy since 11/16/17 (CRRT transitioned to IHD) and process initiated for outpatient dialysis unit placement.  Based on the initial workup/assessment by Dr. Flossie DibbleWebb-he was determined to have ATN rather than HRS for which RRT was started with empiric treatment for HRS. He is not a candidate for liver transplant due to recent alcohol use prior to admission.  Seen by vascular surgery and he does not have any targets for AV fistula creation and he has a risk of AVG clotting due to soft BP--> on the other hand[facility] and with ESLD he's effectively immunocompromised.  Appreciate VVS seeing pt, s/p AVG placement today 4/18.  Albumin/octreotide stopped, continue midodrine for blood pressure support.  Continue hemodialysis on a TTS schedule.  Awaiting disposition and CLIP, he may have to go to a [facility] unit  2. Alcoholic cirrhosis with encephalopathy-not a candidate for liver transplantation referral at this time given recent alcohol use, albumin levels remain surprisingly elevated post infusions/hemodialysis.  He is bothered by the loose stools on lactulose.  3. Thrombocytopenia-secondary to cirrhosis/hypersplenism from portal hypertension.  No evidence of prolonged bleeding at present  4. Alcohol abuse-we educated regarding cessation from alcohol and need for follow-up with gastroenterology.   5. Disposition-appreciate input from palliative care service and the process for outpatient dialysis unit placement is underway.  Subjective:    S/p AVG today.     Objective:   BP 101/64 (BP Location: Right Arm)   Pulse 72   Temp 97.7 F (36.5 C) (Oral)   Resp 14   Ht 5\' 6"  (1.676 m)   Wt 75.9 kg (167 lb 5.3 oz)   SpO2 93%   BMI 27.01 kg/m   Intake/Output Summary (Last 24 hours)  at 12/10/2017 1323 Last data filed at 12/10/2017 1123 Gross per 24 hour  Intake 840 ml  Output 20 ml  Net 820 ml   Weight change: 3.7 kg (8 lb 2.5 oz)  Physical Exam: Gen: Sitting in chair, eating lunch CVS: Pulse regular rhythm, ejection systolic murmur over apex Resp: Decreased breath sounds over bases, bibasilar crackles Abd: Soft, distended, nontender Ext: trace LE edema ACCESS: R IJ TDC, L forearm AVG + thrill  Imaging: No results found.  Labs: BMET Recent Labs  Lab 12/04/17 [phone] 12/05/17 [id number] 12/06/17 0625 12/06/17 1313 12/07/17 0453 12/07/17 0915 12/08/17 0727 12/09/17 0435 12/10/17 0503  NA 137 139 136  --  134* 131* 135 135 136  K 3.9 3.9 4.5  --  5.4* 5.5* 5.0 3.8 4.1  CL 102 104 99*  --  100* 97* 101 102 103  CO2 24 21* 23  --  20* 18* 19* 22 22  GLUCOSE 231* 126* 321* 377* 462* 567* 257* 132* 153*  BUN 16 26* 17  --  36* 42* 53* 26* 38*  CREATININE 3.98* 5.23* 4.25*  --  5.85* 6.18* 7.15* 5.06* 6.63*  CALCIUM 8.5* 9.7 9.5  --  8.9 9.1 9.4 8.9 8.9  PHOS 3.4 3.2 3.8  --  5.6*  --  6.5* 5.7*  --    CBC Recent Labs  Lab 12/06/17 0625 12/07/17 0453 12/08/17 0727 12/09/17 0435 12/10/17 0503  WBC 7.2 6.4 7.1 6.6 7.2  NEUTROABS 4.0 3.9 3.9 3.3  --   HGB 10.8* 9.7* 9.8*  9.4* 9.6*  HCT 35.0* 30.5* 30.6* 30.0* 30.1*  MCV 113.3* 110.9* 106.3* 106.4* 106.7*  PLT 70* 77* 97* 75* 90*    Medications:    . feeding supplement (PRO-STAT SUGAR FREE 64)  30 mL Oral BID BM  . fentaNYL  12.5 mcg Transdermal Q72H  . gabapentin  1,200 mg Oral TID  . guaiFENesin  1,200 mg Oral BID  . hydrochlorothiazide  12.5 mg Oral BID  . insulin aspart  0-20 Units Subcutaneous TID WC  . insulin aspart  9-17 Units Subcutaneous QID  . insulin glargine  33 Units Subcutaneous Daily  . lactulose  10 g Oral BID  . lidocaine  1 patch Transdermal Q24H  . mouth rinse  15 mL Mouth Rinse q12n4p  . midodrine  10 mg Oral TID WC  . morphine  15 mg Oral TID  . pantoprazole  40 mg Oral  Daily  . polyethylene glycol  17 g Oral BID  . rifaximin  550 mg Oral BID  . sodium chloride flush  10-40 mL Intracatheter Q12H  . thiamine  100 mg Oral Daily   Bufford Buttner MD [phone] 12/10/2017, 1:23 PM

## 2017-12-10 NOTE — Progress Notes (Signed)
Inpatient Diabetes Program Recommendations  AACE/ADA: New Consensus Statement on Inpatient Glycemic Control (2015)  Target Ranges:  Prepandial:   less than 140 mg/dL      Peak postprandial:   less than 180 mg/dL (1-2 hours)      Critically ill patients:  140 - 180 mg/dL  Results for [name], Thom P (MRN [id number]) as of 12/10/2017 10:21  Ref. Range 12/09/2017 08:09 12/09/2017 12:32 12/09/2017 17:26 12/09/2017 20:04 12/10/2017 07:29  Glucose-Capillary Latest Ref Range: 65 - 99 mg/dL 086139 (H) 578226 (H) 469261 (H) 134 (H) 172 (H)    Review of Glycemic Control  Diabetes history:DM2 Outpatient Diabetes medications:Lantus 37 units daily, Novolog 9-17 units TID with meals Current orders for Inpatient glycemic control:Lantus 20 units QHS, Novolog 4 units TID with meals, Novolog 0-20 units TID with meals, Novolog 0-5 units QHS   Inpatient Diabetes Program Recommendations:  Insulin-Meal Coverage: Please consider increasing meal coverage to Novolog 7 units TID with meals TID.  Thanks, Orlando PennerMarie Lilyian Quayle, RN, MSN, CDE Diabetes Coordinator Inpatient Diabetes Program [phone] (Team Pager from 8am to 5pm)

## 2017-12-10 NOTE — Progress Notes (Signed)
PT Cancellation Note  Patient Details Name: [name] MRN: [id number] DOB: 09/14/1960   Cancelled Treatment:    Reason Eval/Treat Not Completed: (P) Patient at procedure or test/unavailable(Pt to OR for placement of AV graft. Will defer tx at this time. Will continue efforts per POC.  )   Shruthi Northrup Artis DelayJ Zyree Traynham 12/10/2017, 12:10 PM  Joycelyn RuaAimee Takao Lizer, PTA pager [phone]

## 2017-12-10 NOTE — Anesthesia Postprocedure Evaluation (Signed)
Anesthesia Post Note  Patient: [name]  Procedure(s) Performed: INSERTION OF ARTERIOVENOUS (AV) GORE-TEX GRAFT LEFT  ARM (Left Arm Lower)     Patient location during evaluation: PACU Anesthesia Type: MAC Level of consciousness: awake and alert Pain management: pain level controlled Vital Signs Assessment: post-procedure vital signs reviewed and stable Respiratory status: spontaneous breathing, nonlabored ventilation, respiratory function stable and patient connected to nasal cannula oxygen Cardiovascular status: stable and blood pressure returned to baseline Postop Assessment: no apparent nausea or vomiting Anesthetic complications: no    Last Vitals:  Vitals:   12/10/17 1700 12/10/17 1730  BP: 97/63 95/61  Pulse: 88 84  Resp:    Temp:    SpO2:      Last Pain:  Vitals:   12/10/17 1700  TempSrc:   PainSc: [place]

## 2017-12-10 NOTE — Progress Notes (Signed)
PROGRESS NOTE    [name]   ZOX:[id number]  DOB: August 31, 1960  DOA: 11/07/2017 PCP: Dettinger, Elige Radon, MD   Brief Narrative:  [name] 57 y.o.year old malewith medical history significant for alcoholic cirrhosis, T2DM, PTSD, and chronic back pain who presented on 3/16/2019with 2 weeks of abdominal swelling, worsening jaundice and somnolence and was found to have decompensated cirrhosis. He was intubated for airway protection and extubated on 3/20. He had a complicated hospital course : hypoxia and somnolence, empirically given SBP therapy (not enough asites to tap), recurrent hypoglycemia requiring IV dextrose, severe cellulitis of lower extremities treated with vancomycin, anuric AKI/ATN prompted discontinuation of Vancomyin  (3/20), empiric IV albumin/midodrine/octreotide treatment (3/23-4/8), CRRT and eventually transition to intermittent HD( 4/2). Per renal, the patient has poor prognosis and it was unclear if he can tolerate HD long term and thus palliative care was consulted. Transferred to [facility] service on 11/23/17  I began to take care of him on 4/18.   Subjective: He has no complaints for me today. His wife would like him to have dialysis done in [place].     Assessment & Plan:   Active Problems:   Decompensated alcoholic cirrhosis, Hepatic encephalopathy, Elevated LFTs, Thrombocytopenia -Not a Candidate for a Liver Transplant - Hepatitis panel negative - cont Lactulose and Rifaximin - follow platelets - has been educated in regards to ETOH abuse  AKI now on permanent dialysis/ hypotension - suspected to be ATN and not HRS - CRRT 3/26-3/29, s/p tunneled cath placement on 4/5, - - Palliative involved for GOC- At this point patient wants to continue with treatment   - Vein Mapping done - he has an SVT in the Right Basilic Vein and Left Basilic Vein  - Seen by vascular surgery- he does not have any targets for AV fistula creation and he has a risk of AVG  clotting due to low BP - cont Midodrine - Appreciate Nephrology Recc's and they have started search for outpatient Dialysis centers - dialysis days now fixed>> TTS -  right IJ TDC - left arm AVG placed today  Macrocytic Anemia, likely of chronic disease and alcohol abuse - has sufficient Iron, folate and B12 stores  Type 2 DM uncontrolled - Hemoglobin A1C4.4 (11/2017) - sugars very erratic in the hospital and raging from very low to 300s - Diabetes Education Coordinator evaluated and it was discovered that patient has been refusing his Long Acting Lantus - he has also been eating simple carbs such as rice crispy treats - cont Lantus and Novolog  Neuropathy - diabetic and possibly alcoholic - resumed Neurontin  Chronic back Pain? - on MSIR as outpt via [place]- last prescribed 25 tabs on 3/7 and prior to this prescribed 35 tabs on 2/7  - 4/17> currently on Fentanyl 12.5mg  and using Oxycdone 10mg  2-3 times a day- weaned to Oxycodone 5 Q4 PRN - cont above for now due to AV graft placement today    NON COMPLIANCE WITH MEDS AND DIET  - Intermittently refusing to take medications  DVT prophylaxis: Heparin Code Status: Partial- OK with NIPPV and Pressors Family Communication:  Disposition Plan: await Clipping, PT recommend HHPT with 24 hr supervision, wheelchair and 3 in 1 Consultants:   Nephrology  Palliative Care Medicine  Vascular Surgery   Procedures:   Intubation  CRRT  Dialysis cath insertion  Vein mapping  AV graft left arm Antimicrobials:  Anti-infectives (From admission, onward)   Start     Dose/Rate Route Frequency Ordered  Stop   12/10/17 0600  ceFAZolin (ANCEF) IVPB 1 g/50 mL premix    Note to Pharmacy:  Send with pt to OR   1 g 100 mL/hr over 30 Minutes Intravenous To Surgery 12/09/17 1117 12/10/17 0932   11/27/17 0800  ceFAZolin (ANCEF) IVPB 2g/100 mL premix     2 g 200 mL/hr over 30 Minutes Intravenous To Radiology 11/27/17 0747 11/27/17 1455   11/20/17  2200  rifaximin (XIFAXAN) tablet 550 mg     550 mg Oral 2 times daily 11/20/17 1004     11/17/17 1000  rifaximin (XIFAXAN) tablet 550 mg  Status:  Discontinued     550 mg Per Tube 2 times daily 11/17/17 0844 11/20/17 1004   11/16/17 2200  piperacillin-tazobactam (ZOSYN) IVPB 3.375 g  Status:  Discontinued     3.375 g 100 mL/hr over 30 Minutes Intravenous Every 6 hours 11/16/17 1902 11/19/17 1006   11/15/17 1400  piperacillin-tazobactam (ZOSYN) IVPB 3.375 g  Status:  Discontinued     3.375 g 12.5 mL/hr over 240 Minutes Intravenous Every 12 hours 11/15/17 1327 11/16/17 1902   11/11/17 2200  rifaximin (XIFAXAN) tablet 550 mg  Status:  Discontinued     550 mg Oral 2 times daily 11/11/17 1611 11/17/17 0844   11/08/17 2200  vancomycin (VANCOCIN) 1,500 mg in sodium chloride 0.9 % 500 mL IVPB  Status:  Discontinued     1,500 mg 250 mL/hr over 120 Minutes Intravenous Every 12 hours 11/08/17 1140 11/09/17 1210   11/08/17 0900  vancomycin (VANCOCIN) 2,000 mg in sodium chloride 0.9 % 500 mL IVPB     2,000 mg 250 mL/hr over 120 Minutes Intravenous  Once 11/08/17 0858 11/08/17 1200   11/07/17 1800  cefTRIAXone (ROCEPHIN) 2 g in sodium chloride 0.9 % 100 mL IVPB     2 g 200 mL/hr over 30 Minutes Intravenous Every 24 hours 11/07/17 1753 11/14/17 1759       Objective: Vitals:   12/10/17 1205 12/10/17 1209 12/10/17 1224 12/10/17 1321  BP: 98/61 97/61 101/64 101/64  Pulse: 73 73 72 72  Resp: (!) 21 14 14 14   Temp: (!) 97.5 F (36.4 C)  97.7 F (36.5 C) 97.7 F (36.5 C)  TempSrc:    Oral  SpO2: 94% 94% 93% 93%  Weight:      Height:        Intake/Output Summary (Last 24 hours) at 12/10/2017 1420 Last data filed at 12/10/2017 1123 Gross per 24 hour  Intake 640 ml  Output 20 ml  Net 620 ml   Filed Weights   12/08/17 1134 12/09/17 0515 12/10/17 0545  Weight: 70.4 kg (155 lb 3.3 oz) 72.3 kg (159 lb 6.3 oz) 75.9 kg (167 lb 5.3 oz)    Examination: General exam: Appears comfortable  HEENT:  PERRLA, oral mucosa moist, no sclera icterus or thrush Respiratory system: Clear to auscultation. Respiratory effort normal. Cardiovascular system: S1 & S2 heard, RRR.   Gastrointestinal system: Abdomen soft, non-tender, nondistended. Normal bowel sound. No organomegaly Central nervous system: Alert and oriented. No focal neurological deficits. Extremities: left arm wound noted, pink foam on b/l legs not opened today Psychiatry:  Mood & affect appropriate.     Data Reviewed: I have personally reviewed following labs and imaging studies  CBC: Recent Labs  Lab 12/05/17 0633 12/06/17 0625 12/07/17 0453 12/08/17 0727 12/09/17 0435 12/10/17 0503  WBC 8.0 7.2 6.4 7.1 6.6 7.2  NEUTROABS 4.2 4.0 3.9 3.9 3.3  --  HGB 10.9* 10.8* 9.7* 9.8* 9.4* 9.6*  HCT 35.8* 35.0* 30.5* 30.6* 30.0* 30.1*  MCV 112.6* 113.3* 110.9* 106.3* 106.4* 106.7*  PLT 92* 70* 77* 97* 75* 90*   Basic Metabolic Panel: Recent Labs  Lab 12/05/17 0633 12/06/17 0625  12/07/17 0453 12/07/17 0915 12/08/17 0727 12/09/17 0435 12/10/17 0503  NA 139 136  --  134* 131* 135 135 136  K 3.9 4.5  --  5.4* 5.5* 5.0 3.8 4.1  CL 104 99*  --  100* 97* 101 102 103  CO2 21* 23  --  20* 18* 19* 22 22  GLUCOSE 126* 321*   < > 462* 567* 257* 132* 153*  BUN 26* 17  --  36* 42* 53* 26* 38*  CREATININE 5.23* 4.25*  --  5.85* 6.18* 7.15* 5.06* 6.63*  CALCIUM 9.7 9.5  --  8.9 9.1 9.4 8.9 8.9  MG 1.9 1.8  --  1.7  --  1.7 1.7  --   PHOS 3.2 3.8  --  5.6*  --  6.5* 5.7*  --    < > = values in this interval not displayed.   GFR: Estimated Creatinine Clearance: 11.4 mL/min (A) (by C-G formula based on SCr of 6.63 mg/dL (H)). Liver Function Tests: Recent Labs  Lab 12/05/17 [phone] 12/06/17 0625 12/07/17 0453 12/08/17 0727 12/09/17 0435  AST 80* 78* 62* 74* 81*  ALT 16* 15* 13* 16* 16*  ALKPHOS 157* 164* 131* 155* 153*  BILITOT 7.6* 7.8* 6.3* 6.6* 5.9*  PROT 8.1 8.2* 6.6 7.1 6.5  ALBUMIN 4.2 4.0 3.4* 3.5 3.3*   No results for  input(s): LIPASE, AMYLASE in the last 168 hours. No results for input(s): AMMONIA in the last 168 hours. Coagulation Profile: Recent Labs  Lab 12/10/17 0503  INR 1.54   Cardiac Enzymes: No results for input(s): CKTOTAL, CKMB, CKMBINDEX, TROPONINI in the last 168 hours. BNP (last 3 results) No results for input(s): PROBNP in the last 8760 hours. HbA1C: No results for input(s): HGBA1C in the last 72 hours. CBG: Recent Labs  Lab 12/09/17 1726 12/09/17 2004 12/10/17 0729 12/10/17 1128 12/10/17 1244  GLUCAP 261* 134* 172* 220* 215*   Lipid Profile: No results for input(s): CHOL, HDL, LDLCALC, TRIG, CHOLHDL, LDLDIRECT in the last 72 hours. Thyroid Function Tests: No results for input(s): TSH, T4TOTAL, FREET4, T3FREE, THYROIDAB in the last 72 hours. Anemia Panel: No results for input(s): VITAMINB12, FOLATE, FERRITIN, TIBC, IRON, RETICCTPCT in the last 72 hours. Urine analysis:    Component Value Date/Time   COLORURINE YELLOW 11/11/2017 1205   APPEARANCEUR TURBID (A) 11/11/2017 1205   LABSPEC 1.025 11/11/2017 1205   PHURINE 6.5 11/11/2017 1205   GLUCOSEU NEGATIVE 11/11/2017 1205   HGBUR LARGE (A) 11/11/2017 1205   BILIRUBINUR LARGE (A) 11/11/2017 1205   BILIRUBINUR n 02/28/2011 1247   KETONESUR NEGATIVE 11/11/2017 1205   PROTEINUR >300 (A) 11/11/2017 1205   UROBILINOGEN 1.0 01/17/2015 1305   NITRITE NEGATIVE 11/11/2017 1205   LEUKOCYTESUR MODERATE (A) 11/11/2017 1205   Sepsis Labs: @LABRCNTIP (procalcitonin:4,lacticidven:4) )No results found for this or any previous visit (from the past 240 hour(s)).       Radiology Studies: No results found.    Scheduled Meds: . feeding supplement (PRO-STAT SUGAR FREE 64)  30 mL Oral BID BM  . fentaNYL  12.5 mcg Transdermal Q72H  . gabapentin  1,200 mg Oral TID  . guaiFENesin  1,200 mg Oral BID  . insulin aspart  0-20 Units Subcutaneous TID WC  . [  START ON 12/11/2017] insulin glargine  26 Units Subcutaneous Daily  . lactulose   10 g Oral BID  . lidocaine  1 patch Transdermal Q24H  . mouth rinse  15 mL Mouth Rinse q12n4p  . midodrine  10 mg Oral TID WC  . pantoprazole  40 mg Oral Daily  . polyethylene glycol  17 g Oral BID  . rifaximin  550 mg Oral BID  . sodium chloride flush  10-40 mL Intracatheter Q12H  . thiamine  100 mg Oral Daily   Continuous Infusions: . sodium chloride 250 mL (12/10/17 0852)     LOS: 33 days    Time spent in minutes: 50 min    [name] CantorSaima Constantin Hillery, MD [facility] Pager: [email or web address] Password [email or web address] 12/10/2017, 2:20 PM

## 2017-12-10 NOTE — Transfer of Care (Signed)
Immediate Anesthesia Transfer of Care Note  Patient: [name]  Procedure(s) Performed: INSERTION OF ARTERIOVENOUS (AV) GORE-TEX GRAFT LEFT  ARM (Left Arm Lower)  Patient Location: PACU  Anesthesia Type:MAC  Level of Consciousness: drowsy and patient cooperative  Airway & Oxygen Therapy: Patient Spontanous Breathing and Patient connected to face mask oxygen  Post-op Assessment: Report given to RN and Post -op Vital signs reviewed and stable  Post vital signs: Reviewed and stable  Last Vitals:  Vitals Value Taken Time  BP 87/56 12/10/2017 11:22 AM  Temp    Pulse 80 12/10/2017 11:24 AM  Resp 16 12/10/2017 11:24 AM  SpO2 96 % 12/10/2017 11:24 AM  Vitals shown include unvalidated device data.  Last Pain:  Vitals:   12/10/17 1122  TempSrc:   PainSc: (P) Asleep      Patients Stated Pain Goal: 3 (65/68/12 7517)  Complications: No apparent anesthesia complications

## 2017-12-10 NOTE — Anesthesia Preprocedure Evaluation (Signed)
Anesthesia Evaluation  Patient identified by MRN, date of birth, ID band Patient awake    Reviewed: Allergy & Precautions, H&P , NPO status , Patient's Chart, lab work & pertinent test results  Airway Mallampati: II   Neck ROM: full    Dental   Pulmonary former smoker,    breath sounds clear to auscultation       Cardiovascular hypertension,  Rhythm:regular Rate:Normal     Neuro/Psych  Headaches, PSYCHIATRIC DISORDERS Anxiety    GI/Hepatic GERD  ,  Endo/Other  diabetes, Type 2, Insulin Dependent  Renal/GU ESRF and DialysisRenal disease     Musculoskeletal   Abdominal   Peds  Hematology  (+) anemia ,   Anesthesia Other Findings   Reproductive/Obstetrics                             Anesthesia Physical Anesthesia Plan  ASA: III  Anesthesia Plan: MAC   Post-op Pain Management:    Induction: Intravenous  PONV Risk Score and Plan: 1 and Ondansetron, Propofol infusion, Treatment may vary due to age or medical condition and Midazolam  Airway Management Planned: Simple Face Mask  Additional Equipment:   Intra-op Plan:   Post-operative Plan:   Informed Consent: I have reviewed the patients History and Physical, chart, labs and discussed the procedure including the risks, benefits and alternatives for the proposed anesthesia with the patient or authorized representative who has indicated his/her understanding and acceptance.     Plan Discussed with: CRNA, Anesthesiologist and Surgeon  Anesthesia Plan Comments:         Anesthesia Quick Evaluation

## 2017-12-10 NOTE — Progress Notes (Signed)
Dialysis treatment terminated per pt AMA d/t intense stomach cramps, BM while in chair.  Patient received lactulose prior to treatment.  This RN advised patient request that lactulose be held prior to treatment.  AMA form signed and placed in patient chart.  1500 mL ultrafiltrated.  1000 mL net fluid removal.  Patient status unchanged. Lung sounds clear to ausculation in all fields. BLE generalized edema. Cardiac: NSR.  Cleansed RIJ catheter with chlorhexidine.  Disconnected lines and flushed ports with saline per protocol.  Ports locked with heparin and capped per protocol.    Report given to bedside, RN Tsehainesh.  Patient receiving 1200 mg of gabapentin 3 X daily.  In presence of renal failure, given estimated CC of 14, recommended max dosage daily is 700 mg.  Nephrology notified and gabapentin D/C.  This RN attempted to notify hosptitalist, unable to reach.  Reported to bedside RN and request made for her to attempt to reach teaching service over night.  This RN will follow up with primary team/nephrology tomorrow.   Will continue to monitor.

## 2017-12-10 NOTE — Progress Notes (Addendum)
Palliative:  I have not seen [name] today but I have [facility] who will review his pain regimen upon hospital follow up visit. Currently receiving Fentanyl low dose patch and prn [facility]. He does have long standing chronic pain (has been provided [facility]) that has been exacerbated by dialysis. Unclear if they will plan to continue this regimen or not but they say they will review after discharge.   No charge  [name] ChannelAlicia Burnie Therien, NP Palliative Medicine Team Pager # [phone] (M-F 8a-5p) Team Phone # [phone] (Nights/Weekends)

## 2017-12-10 NOTE — Progress Notes (Signed)
Patient arrived to unit by chair.  Reviewed treatment plan and this RN agrees with plan.  Report received from bedside RN, Z.  Consent verified.  Patient A & O X 4.   Lung sounds diminished to ausculation in all fields. BLE non pitting edema. Cardiac:  NSR.  Removed caps and cleansed RIJ catheter with chlorhedxidine.  Aspirated ports of heparin and flushed them with saline per protocol.  Connected and secured lines, initiated treatment at 1624.  UF Goal of 2500 mL and net fluid removal 2 L.  Will continue to monitor.

## 2017-12-11 ENCOUNTER — Encounter (HOSPITAL_COMMUNITY): Payer: Self-pay | Admitting: Vascular Surgery

## 2017-12-11 LAB — GLUCOSE, CAPILLARY
GLUCOSE-CAPILLARY: 139 mg/dL — AB (ref 65–99)
Glucose-Capillary: 183 mg/dL — ABNORMAL HIGH (ref 65–99)
Glucose-Capillary: 126 mg/dL — ABNORMAL HIGH (ref 65–99)
Glucose-Capillary: 186 mg/dL — ABNORMAL HIGH (ref 65–99)
Glucose-Capillary: 162 mg/dL — ABNORMAL HIGH (ref 65–99)

## 2017-12-11 MED ORDER — GABAPENTIN 300 MG PO CAPS
300.0000 mg | ORAL_CAPSULE | ORAL | Status: DC
  Administered 2017-12-12 (×2): 300 mg via ORAL
  Filled 2017-12-11: qty 1

## 2017-12-11 MED ORDER — GABAPENTIN 100 MG PO CAPS
200.0000 mg | ORAL_CAPSULE | Freq: Every day | ORAL | Status: DC
  Administered 2017-12-11 – 2017-12-13 (×3): 200 mg via ORAL
  Filled 2017-12-11 (×3): qty 2

## 2017-12-11 NOTE — Progress Notes (Signed)
Pharmacy Note:  Asked to dose gabapentin for patient as new ESRD with HD TTS  Patient has been on 1200 mg TID, which would likely lead to adverse affects in ESRD.  Plan: Change to 200 mg gabapentin once daily Give supplemental 300 mg gabapentin once nightly on dialysis days If neuropathic pain not controlled on this dose, can increase daily dose to 300 mg Please let us know if additional help is needed  Isaac BlissMichael Detron Carras, PharmD, BCPS, BCCCP Clinical Pharmacist Clinical phone for 12/11/2017 from 7a-3:30p: [phone] If after 3:30p, please call main pharmacy at: [phone] 12/11/2017 9:17 AM

## 2017-12-11 NOTE — Progress Notes (Signed)
   VASCULAR SURGERY ASSESSMENT & PLAN:   1 Day Post-Op s/p: Placement of left forearm AV graft.  The graft has a good bruit and he has good Doppler flow in the left wrist.  Vascular surgery will be available as needed.  I have arranged follow-up as an outpatient.  SUBJECTIVE:   No specific complaints.  PHYSICAL EXAM:   Vitals:   12/10/17 2045 12/10/17 2323 12/11/17 0155 12/11/17 0522  BP: 112/73 110/73 98/66 98/63   Pulse: 80 77 70 67  Resp: 19 16 18 20   Temp: 98.4 F (36.9 C) 98.4 F (36.9 C) 98 F (36.7 C) 98.1 F (36.7 C)  TempSrc: Oral Oral Oral Oral  SpO2: 99% 100% 98% 93%  Weight:      Height:       Good bruit in left forearm AV graft. Moderate ecchymosis and swelling as expected. Incisions look fine. Radial and ulnar signal with the Doppler.  LABS:   Lab Results  Component Value Date   WBC 7.2 12/10/2017   HGB 9.6 (L) 12/10/2017   HCT 30.1 (L) 12/10/2017   MCV 106.7 (H) 12/10/2017   PLT 90 (L) 12/10/2017   Lab Results  Component Value Date   CREATININE 6.63 (H) 12/10/2017   Lab Results  Component Value Date   INR 1.54 12/10/2017   CBG (last 3)  Recent Labs    12/10/17 2124 12/11/17 0558 12/11/17 0804  GLUCAP 252* 183* 126*    PROBLEM LIST:    Active Problems:   Hypoxia   Acute respiratory failure with hypoxia (HCC)   Cirrhosis (HCC)   Acute renal failure (ARF) (HCC)   Goals of care, counseling/discussion   Palliative care by specialist   ESRD (end stage renal disease) on dialysis (HCC)   Thrombocytopenia (HCC)   Alcohol abuse   Pain, generalized   CURRENT MEDS:   . feeding supplement (PRO-STAT SUGAR FREE 64)  30 mL Oral BID BM  . fentaNYL  12.5 mcg Transdermal Q72H  . gabapentin  200 mg Oral Daily  . [START ON 12/12/2017] gabapentin  300 mg Oral Once per day on Tue Thu Sat  . insulin aspart  0-20 Units Subcutaneous TID WC  . insulin glargine  26 Units Subcutaneous Daily  . lactulose  10 g Oral BID  . lidocaine  1 patch  Transdermal Q24H  . mouth rinse  15 mL Mouth Rinse q12n4p  . midodrine  10 mg Oral TID WC  . rifaximin  550 mg Oral BID  . sodium chloride flush  10-40 mL Intracatheter Q12H  . thiamine  100 mg Oral Daily    Waverly FerrariChristopher Dickson Beeper: [phone] Office: [phone] 12/11/2017

## 2017-12-11 NOTE — Progress Notes (Signed)
Inpatient Diabetes Program Recommendations  AACE/ADA: New Consensus Statement on Inpatient Glycemic Control (2015)  Target Ranges:  Prepandial:   less than 140 mg/dL      Peak postprandial:   less than 180 mg/dL (1-2 hours)      Critically ill patients:  140 - 180 mg/dL   Results for [name], [name] (MRN [id number]) as of 12/11/2017 11:28  Ref. Range 12/10/2017 07:29 12/10/2017 11:28 12/10/2017 12:44 12/10/2017 21:24 12/11/2017 05:58 12/11/2017 08:04  Glucose-Capillary Latest Ref Range: 65 - 99 mg/dL 409172 (H) 811220 (H) 914215 (H)  Novolog 7 units  Lantus 33 units @ 13:12 252 (H)  Novolog 11 units 183 (H) 126 (H)  Novolog 3 units  Lantus 26 units@ 10:41   Review of Glycemic Control Diabetes history:DM2 Outpatient Diabetes medications:Lantus 37 units daily, Novolog 9-17 units TID with meals Current orders for Inpatient glycemic control:Lantus 26 units daily, Novolog 0-20 units TID with meals, Novolog 0-5 units QHS  Inpatient Diabetes Program Recommendations:  Insulin - Basal: Noted patient recieved Lantus 33 units on 12/10/17 and Lantus 26 units today (was decreased from 33 to 26 units daily today).  Correction (SSI): Please consider adding back Novolog 0-5 units QHS for bedtime correction scale. Insulin - Meal Coverage: Noted meal coverage insulin was discontinued, Please consider reordering Novolog meal coverage at 5 units TID with meals if patient eats at least 50% of meals.  Thanks, Orlando PennerMarie Drury Ardizzone, RN, MSN, CDE Diabetes Coordinator Inpatient Diabetes Program [phone] (Team Pager from 8am to 5pm)

## 2017-12-11 NOTE — Progress Notes (Signed)
PROGRESS NOTE    [name]   JYN:[id number]  DOB: 08/16/1961  DOA: 11/07/2017 PCP: Dettinger, Elige RadonJoshua A, MD   Brief Narrative:  [name] GheeAaron P Mehl 57 y.o.year old malewith medical history significant for alcoholic cirrhosis, DM 2 on insulin, PTSD, and chronic back pain (on MSIR) who presented on 3/16/2019with 2 weeks of abdominal swelling, worsening jaundice and somnolence and was found to have decompensated cirrhosis. He was intubated for airway protection and extubated on 3/20. He had a complicated hospital course : hypoxia and somnolence, empirically given SBP therapy (not enough asites to tap), recurrent hypoglycemia requiring IV dextrose, severe cellulitis of lower extremities treated with vancomycin, anuric AKI/ATN prompted discontinuation of Vancomyin (3/20), empiric IV albumin/midodrine/octreotide treatment (3/23-4/8), CRRT and eventually transition to intermittent HD( 4/2). Per renal, the patient has poor prognosis and it was unclear if he can tolerate HD long term. Thus palliative care was consulted. [facility]   I began to take care of him on 4/17.   Subjective: He has no complaints again today.  When asked specifically about how many stools he had yesterday, he states 5. When asked about pain in his legs, he states that it is persistently below his knee but Neurontin makes him jerk. When asked about his left arm AVG, he states it is causing severe pain.     Assessment & Plan:   Active Problems:   Decompensated alcoholic cirrhosis, Hepatic encephalopathy, Elevated LFTs, Thrombocytopenia -Not a Candidate for a Liver Transplant - Hepatitis panel negative - cont Lactulose and Rifaximin- I cut back on the dose of Lactulose from 20 TID to 10 BID but still is having numerous stools- I have made a note on my order to hold the second dose if his has had 3 BMs from the first dose and have explained this to the patient - follow platelets - has been  educated in regards to ETOH abuse  AKI now on permanent dialysis/ hypotension - suspected to be ATN and not HRS - CRRT 3/26 >> HD - s/p tunneled cath placement on 4/5, - - Palliative involved for GOC- At this point patient wants to continue with treatment   - Vein Mapping done - he has an SVT in the Right Basilic Vein and Left Basilic Vein  - Seen by vascular surgery- he does not have any targets for AV fistula creation and he has a risk of AVG clotting due to low BP - cont Midodrine - Appreciate Nephrology Recc's and they have started search for outpatient Dialysis centers - dialysis days now fixed>> TTS -  right IJ TDC - left arm AVG placed 4/18- cont Oxycodone PRN for this  Macrocytic Anemia, likely of chronic disease and alcohol abuse - has sufficient Iron, folate and B12 stores  Type 2 DM uncontrolled - Hemoglobin A1C4.4 (11/2017) - cont Lantus and Novolog which he takes at home - sugars very erratic in the hospital and raging from very low to 300s - Diabetes Education Coordinator evaluated and it was discovered that patient has been refusing his Long Acting Lantus - he has also been eating simple carbs such as rice crispy treats- re-educated   Peripheral Neuropathy - diabetic and possibly alcoholic- severe pain in lower legs and feet -in an attempt to reduce his narcotic intake, I resumed Neurontin and asked pharmacy to dose (on 4/17 and again today)- the high dose in setting of renal disease may have been causing his jerking- follow on lower dose  Chronic back  Pain? - per [place] drug database, on MSIR as outpt via [place]- last prescribed 25 tabs on 3/7 and prior to this prescribed 35 tabs on 2/7  - 4/17> currently on Fentanyl 12.5mg  and using Oxycdone 10mg  2-3 times a day- weaned to Oxycodone 5 Q4 PRN - cont this for now due to AV graft placement    NON COMPLIANCE WITH MEDS AND DIET  - was intermittently refusing to take medications  DVT prophylaxis: Heparin Code Status:  Partial- OK with NIPPV and Pressors Family Communication:  Disposition Plan: await Clipping, PT recommend HHPT with 24 hr supervision, wheelchair and 3 in 1 Consultants:   Nephrology  Palliative Care Medicine  Vascular Surgery   Procedures:   Intubation  CRRT  Dialysis cath insertion- temporary and then tunnelled  Vein mapping  AV graft left arm Antimicrobials:  Anti-infectives (From admission, onward)   Start     Dose/Rate Route Frequency Ordered Stop   12/10/17 0600  ceFAZolin (ANCEF) IVPB 1 g/50 mL premix    Note to Pharmacy:  Send with pt to OR   1 g 100 mL/hr over 30 Minutes Intravenous To Surgery 12/09/17 1117 12/10/17 0932   11/27/17 0800  ceFAZolin (ANCEF) IVPB 2g/100 mL premix     2 g 200 mL/hr over 30 Minutes Intravenous To Radiology 11/27/17 0747 11/27/17 1455   11/20/17 2200  rifaximin (XIFAXAN) tablet 550 mg     550 mg Oral 2 times daily 11/20/17 1004     11/17/17 1000  rifaximin (XIFAXAN) tablet 550 mg  Status:  Discontinued     550 mg Per Tube 2 times daily 11/17/17 0844 11/20/17 1004   11/16/17 2200  piperacillin-tazobactam (ZOSYN) IVPB 3.375 g  Status:  Discontinued     3.375 g 100 mL/hr over 30 Minutes Intravenous Every 6 hours 11/16/17 1902 11/19/17 1006   11/15/17 1400  piperacillin-tazobactam (ZOSYN) IVPB 3.375 g  Status:  Discontinued     3.375 g 12.5 mL/hr over 240 Minutes Intravenous Every 12 hours 11/15/17 1327 11/16/17 1902   11/11/17 2200  rifaximin (XIFAXAN) tablet 550 mg  Status:  Discontinued     550 mg Oral 2 times daily 11/11/17 1611 11/17/17 0844   11/08/17 2200  vancomycin (VANCOCIN) 1,500 mg in sodium chloride 0.9 % 500 mL IVPB  Status:  Discontinued     1,500 mg 250 mL/hr over 120 Minutes Intravenous Every 12 hours 11/08/17 1140 11/09/17 1210   11/08/17 0900  vancomycin (VANCOCIN) 2,000 mg in sodium chloride 0.9 % 500 mL IVPB     2,000 mg 250 mL/hr over 120 Minutes Intravenous  Once 11/08/17 0858 11/08/17 1200   11/07/17 1800   cefTRIAXone (ROCEPHIN) 2 g in sodium chloride 0.9 % 100 mL IVPB     2 g 200 mL/hr over 30 Minutes Intravenous Every 24 hours 11/07/17 1753 11/14/17 1759       Objective: Vitals:   12/10/17 2045 12/10/17 2323 12/11/17 0155 12/11/17 0522  BP: 112/73 110/73 98/66 98/63   Pulse: 80 77 70 67  Resp: 19 16 18 20   Temp: 98.4 F (36.9 C) 98.4 F (36.9 C) 98 F (36.7 C) 98.1 F (36.7 C)  TempSrc: Oral Oral Oral Oral  SpO2: 99% 100% 98% 93%  Weight:      Height:        Intake/Output Summary (Last 24 hours) at 12/11/2017 0912 Last data filed at 12/10/2017 1933 Gross per 24 hour  Intake 400 ml  Output 1020 ml  Net -620 ml  Filed Weights   12/10/17 0545 12/10/17 1620 12/10/17 1930  Weight: 75.9 kg (167 lb 5.3 oz) 75.9 kg (167 lb 5.3 oz) 74.9 kg (165 lb 2 oz)    Examination: General exam: Appears comfortable  HEENT: PERRLA, oral mucosa moist, no sclera icterus or thrush Respiratory system: Clear to auscultation. Respiratory effort normal. Cardiovascular system: S1 & S2 heard, RRR.   Gastrointestinal system: Abdomen soft, non-tender, nondistended. Normal bowel sound. No organomegaly Central nervous system: Alert and oriented. No focal neurological deficits. Extremities: left arm wound from AVG noted, Legs are discolored, no edema, pink foam on b/l legs not opened   Psychiatry:  Mood & affect appropriate.   Data Reviewed: I have personally reviewed following labs and imaging studies  CBC: Recent Labs  Lab 12/05/17 0633 12/06/17 0625 12/07/17 0453 12/08/17 0727 12/09/17 0435 12/10/17 0503  WBC 8.0 7.2 6.4 7.1 6.6 7.2  NEUTROABS 4.2 4.0 3.9 3.9 3.3  --   HGB 10.9* 10.8* 9.7* 9.8* 9.4* 9.6*  HCT 35.8* 35.0* 30.5* 30.6* 30.0* 30.1*  MCV 112.6* 113.3* 110.9* 106.3* 106.4* 106.7*  PLT 92* 70* 77* 97* 75* 90*   Basic Metabolic Panel: Recent Labs  Lab 12/05/17 0633 12/06/17 0625  12/07/17 0453 12/07/17 0915 12/08/17 0727 12/09/17 0435 12/10/17 0503  NA 139 136  --  134*  131* 135 135 136  K 3.9 4.5  --  5.4* 5.5* 5.0 3.8 4.1  CL 104 99*  --  100* 97* 101 102 103  CO2 21* 23  --  20* 18* 19* 22 22  GLUCOSE 126* 321*   < > 462* 567* 257* 132* 153*  BUN 26* 17  --  36* 42* 53* 26* 38*  CREATININE 5.23* 4.25*  --  5.85* 6.18* 7.15* 5.06* 6.63*  CALCIUM 9.7 9.5  --  8.9 9.1 9.4 8.9 8.9  MG 1.9 1.8  --  1.7  --  1.7 1.7  --   PHOS 3.2 3.8  --  5.6*  --  6.5* 5.7*  --    < > = values in this interval not displayed.   GFR: Estimated Creatinine Clearance: 11.4 mL/min (A) (by C-G formula based on SCr of 6.63 mg/dL (H)). Liver Function Tests: Recent Labs  Lab 12/05/17 [phone] 12/06/17 0625 12/07/17 0453 12/08/17 0727 12/09/17 0435  AST 80* 78* 62* 74* 81*  ALT 16* 15* 13* 16* 16*  ALKPHOS 157* 164* 131* 155* 153*  BILITOT 7.6* 7.8* 6.3* 6.6* 5.9*  PROT 8.1 8.2* 6.6 7.1 6.5  ALBUMIN 4.2 4.0 3.4* 3.5 3.3*   No results for input(s): LIPASE, AMYLASE in the last 168 hours. No results for input(s): AMMONIA in the last 168 hours. Coagulation Profile: Recent Labs  Lab 12/10/17 0503  INR 1.54   Cardiac Enzymes: No results for input(s): CKTOTAL, CKMB, CKMBINDEX, TROPONINI in the last 168 hours. BNP (last 3 results) No results for input(s): PROBNP in the last 8760 hours. HbA1C: No results for input(s): HGBA1C in the last 72 hours. CBG: Recent Labs  Lab 12/10/17 1128 12/10/17 1244 12/10/17 2124 12/11/17 0558 12/11/17 0804  GLUCAP 220* 215* 252* 183* 126*   Lipid Profile: No results for input(s): CHOL, HDL, LDLCALC, TRIG, CHOLHDL, LDLDIRECT in the last 72 hours. Thyroid Function Tests: No results for input(s): TSH, T4TOTAL, FREET4, T3FREE, THYROIDAB in the last 72 hours. Anemia Panel: No results for input(s): VITAMINB12, FOLATE, FERRITIN, TIBC, IRON, RETICCTPCT in the last 72 hours. Urine analysis:    Component Value Date/Time   COLORURINE  YELLOW 11/11/2017 1205   APPEARANCEUR TURBID (A) 11/11/2017 1205   LABSPEC 1.025 11/11/2017 1205   PHURINE  6.5 11/11/2017 1205   GLUCOSEU NEGATIVE 11/11/2017 1205   HGBUR LARGE (A) 11/11/2017 1205   BILIRUBINUR LARGE (A) 11/11/2017 1205   BILIRUBINUR n 02/28/2011 1247   KETONESUR NEGATIVE 11/11/2017 1205   PROTEINUR >300 (A) 11/11/2017 1205   UROBILINOGEN 1.0 01/17/2015 1305   NITRITE NEGATIVE 11/11/2017 1205   LEUKOCYTESUR MODERATE (A) 11/11/2017 1205   Sepsis Labs: @LABRCNTIP (procalcitonin:4,lacticidven:4) )No results found for this or any previous visit (from the past 240 hour(s)).       Radiology Studies: No results found.    Scheduled Meds: . feeding supplement (PRO-STAT SUGAR FREE 64)  30 mL Oral BID BM  . fentaNYL  12.5 mcg Transdermal Q72H  . guaiFENesin  1,200 mg Oral BID  . insulin aspart  0-20 Units Subcutaneous TID WC  . insulin glargine  26 Units Subcutaneous Daily  . lactulose  10 g Oral BID  . lidocaine  1 patch Transdermal Q24H  . mouth rinse  15 mL Mouth Rinse q12n4p  . midodrine  10 mg Oral TID WC  . pantoprazole  40 mg Oral Daily  . polyethylene glycol  17 g Oral BID  . rifaximin  550 mg Oral BID  . sodium chloride flush  10-40 mL Intracatheter Q12H  . thiamine  100 mg Oral Daily   Continuous Infusions: . sodium chloride 250 mL (12/10/17 0852)     LOS: 34 days    Time spent in minutes: 50 min    Calvert Cantor, MD [facility] Pager: [email or web address] Password [email or web address] 12/11/2017, 9:12 AM

## 2017-12-11 NOTE — Social Work (Signed)
CSW met with pt and pt wife [name]. Pt is glad that he is close to getting to go home and is aware that he will need support during first dialysis sessions. CSW also provided pt and pt wife with a transportation service for [facility] that is able to transport within [employer] and between counties. Pt and pt wife have also called their insurance and they are able to get some rides covered by insurance as well.  CSW will search for and additional areas of transportation support within [facility]. Pt and pt wife very appreciative of CSW support.   Alexander Mt, [place] Work [phone]

## 2017-12-11 NOTE — Progress Notes (Signed)
Physical Therapy Treatment Patient Details Name: [name] MRN: [id number] DOB: Apr 29, 1961 Today's Date: 12/11/2017    History of Present Illness Pt adm with abd swelling and lethargy and found to have acute hepatic encephalopathy. Pt intubated for airway protection  3/17-3/20. Pt also with acute kidney injury. PMH - DM, PTSD, compression fx's and pain/ debilitation, GERD, EtOH abuse with cirrhosis    PT Comments    Pt received in chair with wife present.  Today was not an HD day so activity tolerance was high.  Pt able to walk full unit w/o any rest breaks.  He was supervision for transfers and gait but Min guard during stairs..  Still need to look at activity tolerance after HD as well as stairs after HD.  Pt remains to need skilled home health PT to increase functional independence.     Follow Up Recommendations  Home health PT;Supervision/Assistance - 24 hour     Equipment Recommendations  Wheelchair (measurements PT);3in1 (PT)    Recommendations for Other Services OT consult     Precautions / Restrictions Precautions Precautions: Fall Restrictions Weight Bearing Restrictions: No    Mobility  Bed Mobility                  Transfers Overall transfer level: Needs assistance Equipment used: Rolling walker (2 wheeled) Transfers: Sit to/from Stand Sit to Stand: Supervision            Ambulation/Gait Ambulation/Gait assistance: Supervision Ambulation Distance (Feet): 600 Feet Assistive device: 4-wheeled walker(4 wheeled walker with seat) Gait Pattern/deviations: Step-through pattern;Decreased stride length;Trunk flexed Gait velocity: decreased   General Gait Details: Cues to self-monitor for activity tolerance, to stay close to rollator during gait, and to keep R foot from staying in external rotation.   Stairs   Stairs assistance: Multimedia programmer) Stair Management: Two rails;Forwards;Step to pattern Number of Stairs: 5 General stair  comments: Slow pace but demonastrated safe movement without cues.     Wheelchair Mobility    Modified Rankin (Stroke Patients Only)       Balance Overall balance assessment: Needs assistance Sitting-balance support: No upper extremity supported;Feet supported     Postural control: Posterior lean Standing balance support: Single extremity supported;During functional activity   Standing balance comment: slowed reaction time to perturbation with posterior bias                            Cognition Arousal/Alertness: Awake/alert Behavior During Therapy: WFL for tasks assessed/performed Overall Cognitive Status: Within Functional Limits for tasks assessed Area of Impairment: Memory                     Memory: Decreased short-term memory       Problem Solving: Slow processing General Comments: He is overall anxious about discharge and the intiation of OP HD      Exercises General Exercises - Lower Extremity Ankle Circles/Pumps: AROM;20 reps;Both;Seated Quad Sets: Strengthening;10 reps;Both;Seated Long Arc Quad: AROM;10 reps;Seated;Both Hip ABduction/ADduction: AROM;10 reps;Both;Seated    General Comments        Pertinent Vitals/Pain Pain Assessment: 0-10 Pain Score: 5  Pain Location: back Pain Descriptors / Indicators: Aching Pain Intervention(s): Limited activity within patient's tolerance;Repositioned;Monitored during session    Home Living                      Prior Function  PT Goals (current goals can now be found in the care plan section) Acute Rehab PT Goals Patient Stated Goal: feel prepared before being discharged PT Goal Formulation: With patient Time For Goal Achievement: 12/12/17 Potential to Achieve Goals: Good Progress towards PT goals: Progressing toward goals    Frequency    Min 3X/week      PT Plan Current plan remains appropriate;Frequency needs to be updated    Co-evaluation     PT  goals addressed during session: Mobility/safety with mobility        AM-PAC PT "6 Clicks" Daily Activity  Outcome Measure  Difficulty turning over in bed (including adjusting bedclothes, sheets and blankets)?: None Difficulty moving from lying on back to sitting on the side of the bed? : None Difficulty sitting down on and standing up from a chair with arms (e.g., wheelchair, bedside commode, etc,.)?: A Little Help needed moving to and from a bed to chair (including a wheelchair)?: A Little Help needed walking in hospital room?: A Little Help needed climbing 3-5 steps with a railing? : A Little 6 Click Score: 20    End of Session Equipment Utilized During Treatment: Gait belt Activity Tolerance: Patient tolerated treatment well Patient left: with call bell/phone within reach;in chair Nurse Communication: Mobility status PT Visit Diagnosis: Unsteadiness on feet (R26.81);Other abnormalities of gait and mobility (R26.89);Muscle weakness (generalized) (M62.81)     Time: [id number] PT Time Calculation (min) (ACUTE ONLY): 23 min  Charges:  $Gait Training: 8-22 mins $Therapeutic Exercise: 8-22 mins                    G Codes:       Rosann AuerbachCraig Kiyoshi Schaab, SPTA [phone]    Rosann Auerbachraig Murdock Jellison 12/11/2017, 3:16 PM

## 2017-12-11 NOTE — Progress Notes (Signed)
Patient ID: [name], male   DOB: 11/23/1960, 57 y.o.   MRN: [id number] [facility] Progress Note   Assessment/ Plan:    1. Anuric acute kidney injury, likely ESRD-on renal replacement therapy since 11/16/17 (CRRT transitioned to IHD) and process initiated for outpatient dialysis unit placement.  Based on the initial workup/assessment by Dr. Flossie Dibble was determined to have ATN rather than HRS for which RRT was started with empiric treatment for HRS. He is not a candidate for liver transplant due to recent alcohol use prior to admission.  Seen by vascular surgery and he does not have any targets for AV fistula creation and he has a risk of AVG clotting due to soft BP--> on the other hand, his [facility] is an infection risk and with ESLD he's effectively immunocompromised.  Appreciate VVS seeing pt, s/p AVG placement 4/18.   Midodrine for BP support. Continue hemodialysis on a TTS schedule.  Awaiting disposition and CLIP, he may have to go to a [facility] unit  2. Alcoholic cirrhosis with encephalopathy-not a candidate for liver transplantation referral at this time given recent alcohol use.  He is bothered by the loose stools on lactulose.  3. Thrombocytopenia-secondary to cirrhosis/hypersplenism from portal hypertension.  No evidence of prolonged bleeding at present  4. Alcohol abuse-we educated regarding cessation from alcohol and need for follow-up with gastroenterology.   5. Disposition-appreciate input from palliative care service and the process for outpatient dialysis unit placement is underway.  Subjective:    Had dialysis yesterday.  Is upset because he got lactulose right before he went and had a BM on the machine.  Gabapentin d/c'd.     Objective:   BP 98/63 (BP Location: Right Arm)   Pulse 67   Temp 98.1 F (36.7 C) (Oral)   Resp 20   Ht 5\' 6"  (1.676 m)   Wt 74.9 kg (165 lb 2 oz)   SpO2 93%   BMI 26.65 kg/m   Intake/Output Summary (Last 24 hours) at 12/11/2017  1318 Last data filed at 12/10/2017 1933 Gross per 24 hour  Intake -  Output 1000 ml  Net -1000 ml   Weight change: 0 kg (0 lb)  Physical Exam: Gen: Sitting in chair, NAD HEENT: sclerae icteric CVS: Pulse regular rhythm, ejection systolic murmur over apex Resp: Decreased breath sounds over bases, bibasilar crackles Abd: Soft, distended, nontender Ext: trace LE edema, + rubor and some skin tears ACCESS: R IJ TDC, L forearm AVG + thrill  Imaging: No results found.  Labs: BMET Recent Labs  Lab 12/05/17 [phone] 12/06/17 0625 12/06/17 1313 12/07/17 0453 12/07/17 0915 12/08/17 0727 12/09/17 0435 12/10/17 0503  NA 139 136  --  134* 131* 135 135 136  K 3.9 4.5  --  5.4* 5.5* 5.0 3.8 4.1  CL 104 99*  --  100* 97* 101 102 103  CO2 21* 23  --  20* 18* 19* 22 22  GLUCOSE 126* 321* 377* 462* 567* 257* 132* 153*  BUN 26* 17  --  36* 42* 53* 26* 38*  CREATININE 5.23* 4.25*  --  5.85* 6.18* 7.15* 5.06* 6.63*  CALCIUM 9.7 9.5  --  8.9 9.1 9.4 8.9 8.9  PHOS 3.2 3.8  --  5.6*  --  6.5* 5.7*  --    CBC Recent Labs  Lab 12/06/17 0625 12/07/17 0453 12/08/17 0727 12/09/17 0435 12/10/17 0503  WBC 7.2 6.4 7.1 6.6 7.2  NEUTROABS 4.0 3.9 3.9 3.3  --   HGB  10.8* 9.7* 9.8* 9.4* 9.6*  HCT 35.0* 30.5* 30.6* 30.0* 30.1*  MCV 113.3* 110.9* 106.3* 106.4* 106.7*  PLT 70* 77* 97* 75* 90*    Medications:    . feeding supplement (PRO-STAT SUGAR FREE 64)  30 mL Oral BID BM  . fentaNYL  12.5 mcg Transdermal Q72H  . gabapentin  200 mg Oral Daily  . [START ON 12/12/2017] gabapentin  300 mg Oral Once per day on Tue Thu Sat  . insulin aspart  0-20 Units Subcutaneous TID WC  . insulin glargine  26 Units Subcutaneous Daily  . lactulose  10 g Oral BID  . lidocaine  1 patch Transdermal Q24H  . mouth rinse  15 mL Mouth Rinse q12n4p  . midodrine  10 mg Oral TID WC  . rifaximin  550 mg Oral BID  . sodium chloride flush  10-40 mL Intracatheter Q12H  . thiamine  100 mg Oral Daily   Bufford ButtnerElizabeth Amor Packard  MD [phone] 12/11/2017, 1:18 PM

## 2017-12-12 LAB — CBC
HCT: 28.8 % — ABNORMAL LOW (ref 39.0–52.0)
Hemoglobin: 9.4 g/dL — ABNORMAL LOW (ref 13.0–17.0)
MCH: 34.2 pg — ABNORMAL HIGH (ref 26.0–34.0)
MCHC: 32.6 g/dL (ref 30.0–36.0)
MCV: 104.7 fL — ABNORMAL HIGH (ref 78.0–100.0)
PLATELETS: 83 10*3/uL — AB (ref 150–400)
RBC: 2.75 MIL/uL — ABNORMAL LOW (ref 4.22–5.81)
RDW: 15.1 % (ref 11.5–15.5)
WBC: 8 10*3/uL (ref 4.0–10.5)

## 2017-12-12 LAB — GLUCOSE, CAPILLARY
GLUCOSE-CAPILLARY: 108 mg/dL — AB (ref 65–99)
GLUCOSE-CAPILLARY: 403 mg/dL — AB (ref 65–99)
GLUCOSE-CAPILLARY: 101 mg/dL — AB (ref 65–99)
Glucose-Capillary: 108 mg/dL — ABNORMAL HIGH (ref 65–99)

## 2017-12-12 LAB — RENAL FUNCTION PANEL
ALBUMIN: 3.3 g/dL — AB (ref 3.5–5.0)
Anion gap: 14 (ref 5–15)
BUN: 45 mg/dL — AB (ref 6–20)
CO2: 19 mmol/L — ABNORMAL LOW (ref 22–32)
CREATININE: 6 mg/dL — AB (ref 0.61–1.24)
Calcium: 8.6 mg/dL — ABNORMAL LOW (ref 8.9–10.3)
Chloride: 103 mmol/L (ref 101–111)
GFR calc Af Amer: 11 mL/min — ABNORMAL LOW (ref >60)
GFR, EST NON AFRICAN AMERICAN: 9 mL/min — AB (ref >60)
GLUCOSE: 133 mg/dL — AB (ref 65–99)
PHOSPHORUS: 6.9 mg/dL — AB (ref 2.5–4.6)
POTASSIUM: 4.2 mmol/L (ref 3.5–5.1)
SODIUM: 136 mmol/L (ref 135–145)

## 2017-12-12 MED ORDER — OXYCODONE HCL 5 MG PO TABS
ORAL_TABLET | ORAL | Status: AC
  Filled 2017-12-12: qty 1

## 2017-12-12 MED ORDER — DARBEPOETIN ALFA 100 MCG/0.5ML IJ SOSY: 100.0000 ug | PREFILLED_SYRINGE | INTRAMUSCULAR | Status: DC

## 2017-12-12 NOTE — Progress Notes (Signed)
PROGRESS NOTE    [name]   ZOX:[id number]  DOB: [birth year]  DOA: 11/07/2017 PCP: Dettinger, Elige Radon, MD   Brief Narrative:  [name] 57 y.o.year old malewith medical history significant for alcoholic cirrhosis, DM 2 on insulin, PTSD, and chronic back pain (on MSIR) who presented on 3/16/2019with 2 weeks of abdominal swelling, worsening jaundice and somnolence and was found to have decompensated cirrhosis. He was intubated for airway protection and extubated on 3/20. He had a complicated hospital course : hypoxia and somnolence, empirically given SBP therapy (not enough asites to tap), recurrent hypoglycemia requiring IV dextrose, severe cellulitis of lower extremities treated with vancomycin, anuric AKI/ATN prompted discontinuation of Vancomyin (3/20), empiric IV albumin/midodrine/octreotide treatment (3/23-4/8), CRRT and eventually transition to intermittent HD( 4/2). Per renal, the patient has poor prognosis and it was unclear if he can tolerate HD long term. Thus palliative care was consulted. Transferred to [facility] service on 11/23/17     Subjective: Complaining of some pain at the site of his AVG insertion, no fever, no chills, no shortness of breath or chest pain.  Assessment & Plan:   Active Problems:   Decompensated alcoholic cirrhosis, Hepatic encephalopathy, Elevated LFTs, Thrombocytopenia -Not a Candidate for a Liver Transplant - Hepatitis panel negative - cont Lactulose and Rifaximin-  - follow platelets - has been educated in regards to ETOH abuse  AKI now on permanent dialysis/ hypotension - suspected to be ATN and not HRS - CRRT 3/26 >> HD - s/p tunneled cath placement on 4/5, - - Palliative involved for GOC- At this point patient wants to continue with treatment   - Seen by vascular surgery- he does not have any targets for AV fistula creation and he has a risk of AVG clotting due to low BP - cont Midodrine - Appreciate Nephrology Recc's and they  have started search for outpatient Dialysis centers - dialysis days now fixed>> TTS -  right IJ TDC - left arm AVG placed 4/18- cont Oxycodone PRN for this - continue with midodrine for blood pressure support during hemodialysis, perirenal patient is clipped to [facility] second shift.  Macrocytic Anemia, likely of chronic disease and alcohol abuse - has sufficient Iron, folate and B12 stores,  Transfuse as needed for hemoglobin less than 8  Type 2 DM uncontrolled - Hemoglobin A1C4.4 (11/2017) - cont Lantus and Novolog which he takes at home - sugars very erratic in the hospital and raging from very low to 300s - Diabetes Education Coordinator evaluated and it was discovered that patient has been refusing his Long Acting Lantus  Thrombocytopenia - In the setting of liver disease, continue to monitor closely  Peripheral Neuropathy - diabetic and possibly alcoholic- severe pain in lower legs and feet -in an attempt to reduce his narcotic intake, I resumed Neurontin and asked pharmacy to dose (on 4/17 and again today)- the high dose in setting of renal disease may have been causing his jerking- follow on lower dose  Chronic back Pain? - per [place] drug database, on MSIR as outpt via [place]- last prescribed 25 tabs on 3/7 and prior to this prescribed 35 tabs on 2/7  - 4/17> currently on Fentanyl 12.5mg  and using Oxycdone 10mg  2-3 times a day- weaned to Oxycodone 5 Q4 PRN - cont this for now due to AV graft placement    NON COMPLIANCE WITH MEDS AND DIET  - was intermittently refusing to take medications  DVT prophylaxis: Heparin Code Status: Partial- OK with NIPPV and Pressors  Family Communication:  Disposition Plan: await Clipping, PT recommend HHPT with 24 hr supervision, wheelchair and 3 in 1 Consultants:   Nephrology  Palliative Care Medicine  Vascular Surgery   Procedures:   Intubation  CRRT  Dialysis cath insertion- temporary and then tunnelled  Vein mapping  AV graft left  arm Antimicrobials:  Anti-infectives (From admission, onward)   Start     Dose/Rate Route Frequency Ordered Stop   12/10/17 0600  ceFAZolin (ANCEF) IVPB 1 g/50 mL premix    Note to Pharmacy:  Send with pt to OR   1 g 100 mL/hr over 30 Minutes Intravenous To Surgery 12/09/17 1117 12/10/17 0932   11/27/17 0800  ceFAZolin (ANCEF) IVPB 2g/100 mL premix     2 g 200 mL/hr over 30 Minutes Intravenous To Radiology 11/27/17 0747 11/27/17 1455   11/20/17 2200  rifaximin (XIFAXAN) tablet 550 mg     550 mg Oral 2 times daily 11/20/17 1004     11/17/17 1000  rifaximin (XIFAXAN) tablet 550 mg  Status:  Discontinued     550 mg Per Tube 2 times daily 11/17/17 0844 11/20/17 1004   11/16/17 2200  piperacillin-tazobactam (ZOSYN) IVPB 3.375 g  Status:  Discontinued     3.375 g 100 mL/hr over 30 Minutes Intravenous Every 6 hours 11/16/17 1902 11/19/17 1006   11/15/17 1400  piperacillin-tazobactam (ZOSYN) IVPB 3.375 g  Status:  Discontinued     3.375 g 12.5 mL/hr over 240 Minutes Intravenous Every 12 hours 11/15/17 1327 11/16/17 1902   11/11/17 2200  rifaximin (XIFAXAN) tablet 550 mg  Status:  Discontinued     550 mg Oral 2 times daily 11/11/17 1611 11/17/17 0844   11/08/17 2200  vancomycin (VANCOCIN) 1,500 mg in sodium chloride 0.9 % 500 mL IVPB  Status:  Discontinued     1,500 mg 250 mL/hr over 120 Minutes Intravenous Every 12 hours 11/08/17 1140 11/09/17 1210   11/08/17 0900  vancomycin (VANCOCIN) 2,000 mg in sodium chloride 0.9 % 500 mL IVPB     2,000 mg 250 mL/hr over 120 Minutes Intravenous  Once 11/08/17 0858 11/08/17 1200   11/07/17 1800  cefTRIAXone (ROCEPHIN) 2 g in sodium chloride 0.9 % 100 mL IVPB     2 g 200 mL/hr over 30 Minutes Intravenous Every 24 hours 11/07/17 1753 11/14/17 1759       Objective: Vitals:   12/12/17 1000 12/12/17 1030 12/12/17 1130 12/12/17 1223  BP: 92/61 92/64 (!) 92/55 111/64  Pulse: 80 84 75 78  Resp:    18  Temp:    98.5 F (36.9 C)  TempSrc:    Oral    SpO2:    97%  Weight:    71 kg (156 lb 8.4 oz)  Height:        Intake/Output Summary (Last 24 hours) at 12/12/2017 1346 Last data filed at 12/12/2017 1223 Gross per 24 hour  Intake 183 ml  Output 2000 ml  Net -1817 ml   Filed Weights   12/10/17 1930 12/12/17 0634 12/12/17 1223  Weight: 74.9 kg (165 lb 2 oz) 75.6 kg (166 lb 10.7 oz) 71 kg (156 lb 8.4 oz)    Examination:  Laying on bed. During hemodialysis, in no apparent distress Clear to auscultation, no use of accessory muscles, no wheezing S1 & S2 heard, RRR.   Abdomen soft, non-tender, nondistended. Normal bowel sound. No organomegaly Left arm bruising, tenderness after AVG, mild swelling. Psychiatry:  Mood & affect appropriate.   Data Reviewed:  I have personally reviewed following labs and imaging studies  CBC: Recent Labs  Lab 12/06/17 0625 12/07/17 0453 12/08/17 0727 12/09/17 0435 12/10/17 0503 12/12/17 0903  WBC 7.2 6.4 7.1 6.6 7.2 8.0  NEUTROABS 4.0 3.9 3.9 3.3  --   --   HGB 10.8* 9.7* 9.8* 9.4* 9.6* 9.4*  HCT 35.0* 30.5* 30.6* 30.0* 30.1* 28.8*  MCV 113.3* 110.9* 106.3* 106.4* 106.7* 104.7*  PLT 70* 77* 97* 75* 90* 83*   Basic Metabolic Panel: Recent Labs  Lab 12/06/17 0625  12/07/17 0453 12/07/17 0915 12/08/17 0727 12/09/17 0435 12/10/17 0503 12/12/17 0903  NA 136  --  134* 131* 135 135 136 136  K 4.5  --  5.4* 5.5* 5.0 3.8 4.1 4.2  CL 99*  --  100* 97* 101 102 103 103  CO2 23  --  20* 18* 19* 22 22 19*  GLUCOSE 321*   < > 462* 567* 257* 132* 153* 133*  BUN 17  --  36* 42* 53* 26* 38* 45*  CREATININE 4.25*  --  5.85* 6.18* 7.15* 5.06* 6.63* 6.00*  CALCIUM 9.5  --  8.9 9.1 9.4 8.9 8.9 8.6*  MG 1.8  --  1.7  --  1.7 1.7  --   --   PHOS 3.8  --  5.6*  --  6.5* 5.7*  --  6.9*   < > = values in this interval not displayed.   GFR: Estimated Creatinine Clearance: 12.6 mL/min (A) (by C-G formula based on SCr of 6 mg/dL (H)). Liver Function Tests: Recent Labs  Lab 12/06/17 0625 12/07/17 0453  12/08/17 0727 12/09/17 0435 12/12/17 0903  AST 78* 62* 74* 81*  --   ALT 15* 13* 16* 16*  --   ALKPHOS 164* 131* 155* 153*  --   BILITOT 7.8* 6.3* 6.6* 5.9*  --   PROT 8.2* 6.6 7.1 6.5  --   ALBUMIN 4.0 3.4* 3.5 3.3* 3.3*   No results for input(s): LIPASE, AMYLASE in the last 168 hours. No results for input(s): AMMONIA in the last 168 hours. Coagulation Profile: Recent Labs  Lab 12/10/17 0503  INR 1.54   Cardiac Enzymes: No results for input(s): CKTOTAL, CKMB, CKMBINDEX, TROPONINI in the last 168 hours. BNP (last 3 results) No results for input(s): PROBNP in the last 8760 hours. HbA1C: No results for input(s): HGBA1C in the last 72 hours. CBG: Recent Labs  Lab 12/11/17 0804 12/11/17 1247 12/11/17 1731 12/11/17 2204 12/12/17 0801  GLUCAP 126* 139* 186* 162* 108*   Lipid Profile: No results for input(s): CHOL, HDL, LDLCALC, TRIG, CHOLHDL, LDLDIRECT in the last 72 hours. Thyroid Function Tests: No results for input(s): TSH, T4TOTAL, FREET4, T3FREE, THYROIDAB in the last 72 hours. Anemia Panel: No results for input(s): VITAMINB12, FOLATE, FERRITIN, TIBC, IRON, RETICCTPCT in the last 72 hours. Urine analysis:    Component Value Date/Time   COLORURINE YELLOW 11/11/2017 1205   APPEARANCEUR TURBID (A) 11/11/2017 1205   LABSPEC 1.025 11/11/2017 1205   PHURINE 6.5 11/11/2017 1205   GLUCOSEU NEGATIVE 11/11/2017 1205   HGBUR LARGE (A) 11/11/2017 1205   BILIRUBINUR LARGE (A) 11/11/2017 1205   BILIRUBINUR n 02/28/2011 1247   KETONESUR NEGATIVE 11/11/2017 1205   PROTEINUR >300 (A) 11/11/2017 1205   UROBILINOGEN 1.0 01/17/2015 1305   NITRITE NEGATIVE 11/11/2017 1205   LEUKOCYTESUR MODERATE (A) 11/11/2017 1205   Sepsis Labs: @LABRCNTIP (procalcitonin:4,lacticidven:4) )No results found for this or any previous visit (from the past 240 hour(s)).  Radiology Studies: No results found.    Scheduled Meds: . [START ON 12/15/2017] darbepoetin (ARANESP) injection -  DIALYSIS  100 mcg Intravenous Q Tue-HD  . feeding supplement (PRO-STAT SUGAR FREE 64)  30 mL Oral BID BM  . fentaNYL  12.5 mcg Transdermal Q72H  . gabapentin  200 mg Oral Daily  . gabapentin  300 mg Oral Once per day on Tue Thu Sat  . insulin aspart  0-20 Units Subcutaneous TID WC  . insulin glargine  26 Units Subcutaneous Daily  . lactulose  10 g Oral BID  . lidocaine  1 patch Transdermal Q24H  . mouth rinse  15 mL Mouth Rinse q12n4p  . midodrine  10 mg Oral TID WC  . rifaximin  550 mg Oral BID  . sodium chloride flush  10-40 mL Intracatheter Q12H  . thiamine  100 mg Oral Daily   Continuous Infusions: . sodium chloride 250 mL (12/10/17 0852)     LOS: 35 days    Time spent in minutes: 25 min    Huey Bienenstockawood Taetum Flewellen, MD [facility] Pager:[phone] [email or web address] Password [email or web address] 12/12/2017, 1:46 PM

## 2017-12-12 NOTE — Progress Notes (Signed)
Dr. Randol KernElgergawy notified of CBG 403. Pt refuses to have lab drawn.

## 2017-12-12 NOTE — Progress Notes (Signed)
Patient ID: [name], male   DOB: Aug 24, 1961, 57 y.o.   MRN: [id number] [facility] Progress Note   Assessment/ Plan:    1. Anuric acute kidney injury, now ESRD-on renal replacement therapy since 11/16/17 (CRRT transitioned to IHD) and process initiated for outpatient dialysis unit placement.  Based on the initial workup/assessment by Dr. Flossie Dibble was determined to have ATN rather than HRS for which RRT was started with empiric treatment for HRS. He is not a candidate for liver transplant due to recent alcohol use prior to admission.  Seen by vascular surgery and he does not have any targets for AV fistula creation and he has a risk of AVG clotting due to soft BP--> on the other hand, his [facility] is an infection risk and with ESLD he's effectively immunocompromised.  Appreciate VVS seeing pt, s/p AVG placement 4/18.   Midodrine for BP support. Continue hemodialysis on a TTS schedule, planned for today 4/20.  CLIP'd to [facility] 2nd shift.  Can start Monday.  2. Alcoholic cirrhosis with encephalopathy-not a candidate for liver transplantation referral at this time given recent alcohol use.  He is bothered by the loose stools on lactulose.  3. Thrombocytopenia-secondary to cirrhosis/hypersplenism from portal hypertension.  No evidence of prolonged bleeding at present  4. Alcohol abuse-we educated regarding cessation from alcohol and need for follow-up with gastroenterology.  5.  Anemia: Hgb 9.4, will give Aranesp 100 mcg today, iron sats OK  6. Nutrition: albumin 3.3, protein supps  7. Disposition-appreciate input from palliative care service- OK to go from renal perspective after HD today.  Subjective:    On dialysis.  Feeling OK but still has some L forearm pain.   Objective:   BP 92/64   Pulse 84   Temp 98.5 F (36.9 C) (Oral)   Resp 18   Ht 5\' 6"  (1.676 m)   Wt 75.6 kg (166 lb 10.7 oz)   SpO2 97%   BMI 26.90 kg/m   Intake/Output Summary (Last 24 hours) at 12/12/2017  1205 Last data filed at 12/12/2017 0325 Gross per 24 hour  Intake 183 ml  Output -  Net 183 ml   Weight change: -0.3 kg (-10.6 oz)  Physical Exam: Gen: Sitting in chair, NAD HEENT: sclerae icteric CVS: Pulse regular rhythm, ejection systolic murmur over apex Resp: Decreased breath sounds over bases, bibasilar crackles Abd: Soft, distended, nontender Ext: 1+LE edema, + rubor and some skin tears ACCESS: R IJ TDC, L forearm AVG + thrill, expected postop bruising and some mild swelling.    Imaging: No results found.  Labs: BMET Recent Labs  Lab 12/06/17 0625 12/06/17 1313 12/07/17 0453 12/07/17 0915 12/08/17 0727 12/09/17 0435 12/10/17 0503 12/12/17 0903  NA 136  --  134* 131* 135 135 136 136  K 4.5  --  5.4* 5.5* 5.0 3.8 4.1 4.2  CL 99*  --  100* 97* 101 102 103 103  CO2 23  --  20* 18* 19* 22 22 19*  GLUCOSE 321* 377* 462* 567* 257* 132* 153* 133*  BUN 17  --  36* 42* 53* 26* 38* 45*  CREATININE 4.25*  --  5.85* 6.18* 7.15* 5.06* 6.63* 6.00*  CALCIUM 9.5  --  8.9 9.1 9.4 8.9 8.9 8.6*  PHOS 3.8  --  5.6*  --  6.5* 5.7*  --  6.9*   CBC Recent Labs  Lab 12/06/17 0625 12/07/17 0453 12/08/17 0727 12/09/17 0435 12/10/17 0503 12/12/17 0903  WBC 7.2 6.4 7.1  6.6 7.2 8.0  NEUTROABS 4.0 3.9 3.9 3.3  --   --   HGB 10.8* 9.7* 9.8* 9.4* 9.6* 9.4*  HCT 35.0* 30.5* 30.6* 30.0* 30.1* 28.8*  MCV 113.3* 110.9* 106.3* 106.4* 106.7* 104.7*  PLT 70* 77* 97* 75* 90* 83*    Medications:    . feeding supplement (PRO-STAT SUGAR FREE 64)  30 mL Oral BID BM  . fentaNYL  12.5 mcg Transdermal Q72H  . gabapentin  200 mg Oral Daily  . gabapentin  300 mg Oral Once per day on Tue Thu Sat  . insulin aspart  0-20 Units Subcutaneous TID WC  . insulin glargine  26 Units Subcutaneous Daily  . lactulose  10 g Oral BID  . lidocaine  1 patch Transdermal Q24H  . mouth rinse  15 mL Mouth Rinse q12n4p  . midodrine  10 mg Oral TID WC  . rifaximin  550 mg Oral BID  . sodium chloride flush   10-40 mL Intracatheter Q12H  . thiamine  100 mg Oral Daily   Bufford ButtnerElizabeth Slevin Gunby MD [phone] 12/12/2017, 12:05 PM

## 2017-12-12 NOTE — Procedures (Signed)
Patient seen and examined on Hemodialysis. QB 400 mL/ min via R IJ TDC, UF goal 2.5L.  Tolerating rx well with BP support.  Treatment adjusted as needed.  Bufford ButtnerElizabeth Dsean Vantol MD  [facility] pgr [phone] 12:16 PM

## 2017-12-13 DIAGNOSIS — G8929 Other chronic pain: Secondary | ICD-10-CM

## 2017-12-13 DIAGNOSIS — M545 Low back pain: Secondary | ICD-10-CM

## 2017-12-13 LAB — GLUCOSE, CAPILLARY
Glucose-Capillary: 146 mg/dL — ABNORMAL HIGH (ref 65–99)
Glucose-Capillary: 268 mg/dL — ABNORMAL HIGH (ref 65–99)

## 2017-12-13 MED ORDER — RIFAXIMIN 550 MG PO TABS: 550.0000 mg | ORAL_TABLET | Freq: Two times a day (BID) | ORAL | 0 refills | Status: AC

## 2017-12-13 MED ORDER — GABAPENTIN 300 MG PO CAPS: 300.0000 mg | ORAL_CAPSULE | ORAL | 0 refills | Status: DC

## 2017-12-13 MED ORDER — INSULIN GLARGINE 100 UNIT/ML SOLOSTAR PEN: 26.0000 [IU] | PEN_INJECTOR | Freq: Every day | SUBCUTANEOUS | 1 refills | Status: DC

## 2017-12-13 MED ORDER — LACTULOSE 10 GM/15ML PO SOLN: 10.0000 g | Freq: Two times a day (BID) | ORAL | 0 refills | Status: DC

## 2017-12-13 MED ORDER — MIDODRINE HCL 10 MG PO TABS: 10.0000 mg | ORAL_TABLET | Freq: Three times a day (TID) | ORAL | 0 refills | Status: AC

## 2017-12-13 MED ORDER — FENTANYL 12 MCG/HR TD PT72: 12.5000 ug | MEDICATED_PATCH | TRANSDERMAL | 0 refills | Status: DC

## 2017-12-13 MED ORDER — CALCIUM CARBONATE ANTACID 1250 MG/5ML PO SUSP: 500.0000 mg | Freq: Four times a day (QID) | ORAL | 0 refills | Status: DC | PRN

## 2017-12-13 MED ORDER — OXYCODONE HCL 5 MG PO TABS
10.0000 mg | ORAL_TABLET | Freq: Once | ORAL | Status: AC
  Administered 2017-12-13: 10 mg via ORAL
  Filled 2017-12-13: qty 2

## 2017-12-13 MED ORDER — OXYCODONE HCL 5 MG PO TABS: 5.0000 mg | ORAL_TABLET | ORAL | 0 refills | Status: DC | PRN

## 2017-12-13 MED ORDER — GABAPENTIN 100 MG PO CAPS: 200.0000 mg | ORAL_CAPSULE | Freq: Every day | ORAL | 0 refills | Status: DC

## 2017-12-13 NOTE — Progress Notes (Addendum)
Palliative:  I met again today with Cully and wife, Apolonio Schneiders. They are discharging to home today. They have been set up with outpatient dialysis for MWF. Discussed with family and Dr. Waldron Labs who will prescribe 2 week supply of fentanyl patch. Explained to Marjory Lies and Apolonio Schneiders that I have spoken with his PCP PA's nurse who says that they will review his pain medication at his follow up but did not give me a yes or no if they would continue this. I requested that Apolonio Schneiders call the office and his [facility] advocate to assist with getting hospital follow up appointment arranged ASAP. They are also seeking to change his [facility] PCP to [place] from [place] as it will be very difficult for him to travel that distance now that he has dialysis. Suggested that they call office and advocate daily until appointment arranged. All questions/concerns addressed. Emotional support provided. Wished them the best of luck at home.   Noted left arm to be bruised and swollen and this is the reason for Freemon's increased need for prn pain medication. Chronic back pain appears well managed with low dose fentanyl patch and was only needing 1-2 prn OxyIR in 24 hrs prior to vascular procedure. Otherwise he looks well and is in good spirits.   20 min  Vinie Sill, NP Palliative Medicine Team Pager # [phone] (M-F 8a-5p) Team Phone # [phone] (Nights/Weekends)

## 2017-12-13 NOTE — Care Management (Signed)
[facility] liaison, Jermaine, aware of pt d/c today.  WC and 3n1 to be delivered.

## 2017-12-13 NOTE — Progress Notes (Signed)
[name] GheeAaron P [name] to be D/C'd  per MD order. Discussed with the patient and all questions fully answered.  VSS, Skin clean, dry and intact without evidence of skin break down, no evidence of skin tears noted.  IV catheter discontinued intact. Site without signs and symptoms of complications. Dressing and pressure applied.  An After Visit Summary was printed and given to the patient. Patient received prescription.  D/c education completed with patient/family including follow up instructions, medication list, d/c activities limitations if indicated, with other d/c instructions as indicated by MD - patient able to verbalize understanding, all questions fully answered.   Patient instructed to return to ED, call 911, or call MD for any changes in condition.   Patient to be escorted via WC, and D/C home via private auto.

## 2017-12-13 NOTE — Progress Notes (Signed)
Patient ID: [name], male   DOB: 1960/10/21, 57 y.o.   MRN: [id number] [facility] Progress Note   Assessment/ Plan:    1. Anuric acute kidney injury, now ESRD-on renal replacement therapy since 11/16/17 (CRRT transitioned to IHD) and process initiated for outpatient dialysis unit placement.  Based on the initial workup/assessment by Dr. Flossie Dibble was determined to have ATN rather than HRS for which RRT was started with empiric treatment for HRS. He is not a candidate for liver transplant due to recent alcohol use prior to admission.  Seen by vascular surgery and he does not have any targets for AV fistula creation and he has a risk of AVG clotting due to soft BP--> on the other hand, his [facility] is an infection risk and with ESLD he's effectively immunocompromised.  Appreciate VVS seeing pt, s/p AVG placement 4/18.   Midodrine for BP support. Continue hemodialysis on a TTS schedule while in house, last HD 4/20.  CLIP'd to [facility] M[facility] 2nd shift.  Can start Monday 4/22.  2. Alcoholic cirrhosis with encephalopathy-not a candidate for liver transplantation referral at this time given recent alcohol use.  He is bothered by the loose stools on lactulose.  3. Thrombocytopenia-secondary to cirrhosis/hypersplenism from portal hypertension.  No evidence of prolonged bleeding at present  4. Alcohol abuse-we educated regarding cessation from alcohol and need for follow-up with gastroenterology.  5.  Anemia: Hgb 9.4, will give Aranesp 100 started 4/21, iron sats OK  6. Nutrition: albumin 3.3, protein supps  7. Disposition-appreciate input from palliative care service- OK to go from renal perspective after HD today.  Subjective:    Tolerated HD yesterday.  Doing well.  OK for d/c home from our perspective   Objective:   BP 95/64 (BP Location: Right Arm)   Pulse 87   Temp 98.4 F (36.9 C) (Oral)   Resp 20   Ht 5\' 6"  (1.676 m)   Wt 71.8 kg (158 lb 6.4 oz)   SpO2 95%   BMI 25.57 kg/m    Intake/Output Summary (Last 24 hours) at 12/13/2017 1333 Last data filed at 12/13/2017 0436 Gross per 24 hour  Intake 183 ml  Output 50 ml  Net 133 ml   Weight change: -4.6 kg (-10 lb 2.3 oz)  Physical Exam: Gen: Sitting in chair, NAD HEENT: sclerae icteric CVS: Pulse regular rhythm, ejection systolic murmur over apex Resp: Decreased breath sounds over bases, bibasilar crackles Abd: Soft, distended, nontender Ext: 1+LE edema, + rubor and some skin tears ACCESS: R IJ TDC, L forearm AVG + thrill, expected postop bruising and some mild swelling.  Good strong L radial pulse    Imaging: No results found.  Labs: BMET Recent Labs  Lab 12/07/17 0453 12/07/17 0915 12/08/17 0727 12/09/17 0435 12/10/17 0503 12/12/17 0903  NA 134* 131* 135 135 136 136  K 5.4* 5.5* 5.0 3.8 4.1 4.2  CL 100* 97* 101 102 103 103  CO2 20* 18* 19* 22 22 19*  GLUCOSE 462* 567* 257* 132* 153* 133*  BUN 36* 42* 53* 26* 38* 45*  CREATININE 5.85* 6.18* 7.15* 5.06* 6.63* 6.00*  CALCIUM 8.9 9.1 9.4 8.9 8.9 8.6*  PHOS 5.6*  --  6.5* 5.7*  --  6.9*   CBC Recent Labs  Lab 12/07/17 0453 12/08/17 0727 12/09/17 0435 12/10/17 0503 12/12/17 0903  WBC 6.4 7.1 6.6 7.2 8.0  NEUTROABS 3.9 3.9 3.3  --   --   HGB 9.7* 9.8* 9.4* 9.6* 9.4*  HCT  30.5* 30.6* 30.0* 30.1* 28.8*  MCV 110.9* 106.3* 106.4* 106.7* 104.7*  PLT 77* 97* 75* 90* 83*    Medications:    . [START ON 12/15/2017] darbepoetin (ARANESP) injection - DIALYSIS  100 mcg Intravenous Q Tue-HD  . feeding supplement (PRO-STAT SUGAR FREE 64)  30 mL Oral BID BM  . fentaNYL  12.5 mcg Transdermal Q72H  . gabapentin  200 mg Oral Daily  . gabapentin  300 mg Oral Once per day on Tue Thu Sat  . insulin aspart  0-20 Units Subcutaneous TID WC  . insulin glargine  26 Units Subcutaneous Daily  . lactulose  10 g Oral BID  . lidocaine  1 patch Transdermal Q24H  . mouth rinse  15 mL Mouth Rinse q12n4p  . midodrine  10 mg Oral TID WC  . rifaximin  550 mg Oral  BID  . sodium chloride flush  10-40 mL Intracatheter Q12H  . thiamine  100 mg Oral Daily   Bufford ButtnerElizabeth Mehul Rudin MD [phone] 12/13/2017, 1:33 PM

## 2017-12-13 NOTE — Discharge Summary (Signed)
[name], is a 57 y.o. male  DOB [birth year]  MRN [id number].  Admission date:  11/07/2017  Admitting Physician  Collene Gobble, MD  Discharge Date:  12/13/2017   Primary MD  Dettinger, Fransisca Kaufmann, MD  Recommendations for primary care physician for things to follow:  - this chief CBC, BMP during next visit   Admission Diagnosis  Hypoglycemia [E16.2] Hypoxia [R09.02] Decompensation of cirrhosis of liver ([place]) [K72.90]   Discharge Diagnosis  Hypoglycemia [E16.2] Hypoxia [R09.02] Decompensation of cirrhosis of liver ([place]) [K72.90]    Active Problems:   Hypoxia   Acute respiratory failure with hypoxia (HCC)   Cirrhosis ([place])   Acute renal failure (ARF) (HCC)   Goals of care, counseling/discussion   Palliative care by specialist   ESRD (end stage renal disease) on dialysis ([place])   Thrombocytopenia ([place])   Alcohol abuse   Pain, generalized      Past Medical History:  Diagnosis Date  . Chronic lower back pain   . Compression fracture    lumbar 3  . Diabetes mellitus   . Fracture acetabulum-closed ([place]) 04/09/2013  . GERD (gastroesophageal reflux disease)   . H/O Legionnaire's disease   . Hepatomegaly   . Migraine   . Osteoporosis   . Pancreatitis   . PTSD (post-traumatic stress disorder)   . Vitamin D deficiency     Past Surgical History:  Procedure Laterality Date  . AV FISTULA PLACEMENT Left 12/10/2017   Procedure: INSERTION OF ARTERIOVENOUS (AV) GORE-TEX GRAFT LEFT  ARM;  Surgeon: Angelia Mould, MD;  Location: [facility];  Service: Vascular;  Laterality: Left;  . BACK SURGERY    . CHOLECYSTECTOMY    . HERNIA REPAIR    . IR FLUORO GUIDE CV LINE RIGHT  11/27/2017  . IR US GUIDE VASC ACCESS RIGHT  11/27/2017  . KYPHOPLASTY N/A 07/16/2015   Procedure: Lumbar three kyphoplasty;  Surgeon: Ashok Pall, MD;  Location: [facility];  Service: Neurosurgery;  Laterality: N/A;  Lumbar  three kyphoplasty  . LAMINECTOMY    . MASS EXCISION Left 06/12/2017   Procedure: EXCISION LEFT AXILLARY SEBACEOUS CYST;  Surgeon: Coralie Keens, MD;  Location: [facility];  Service: General;  Laterality: Left;  . STERNUM FRACTURE SURGERY         History of present illness and  Hospital Course:     Kindly see H&P for history of present illness and admission details, please review complete Labs, Consult reports and Test reports for all details in brief  HPI  from the history and physical done on the day of admission  [name] is a 57 y.o. male with medical history significant of HTN, GERD, alcoholic hepatitis with liver cirrhosis, pancreatitis, migraines, osteoporosis, DM type II who presented to the ED with complaints of severe SOB. Patient was seen today at his PCP office with complaints of progressive dyspnea and was noted that his and his SpO2 dropped to 73% on RA. Patient was transferred to [facility] for further care.  Patient reported that his symptoms of progressive shortness of breath, cough with greenish sputum, body ache, fevers were persistent for probably 3 days. He also complains of severe back pain and has a history of lumbar spine surgery and severe pain of the lower extremities due to neuropathy.  ED Course: In the ED his VS showed temperature of 99.4 degrees of Fahrenheit, pulse 109, respirations 27, blood pressure 151/89 Chest x-ray showed no edema or consolidation. EKG showed sinus tachycardia with PVCs, nonspecific ST-T wave changes. Blood work demonstrated elevated 4.37 with improved with hydration and decreased to 3.10 CMP showed hypokalemia with potassium 3.1, hyponatremia with sodium 133, glucose 162, AST 337, alkaline phosphatase 259, total bilirubin 3.9 probably associated with alcoholic hepatitis and liver cirrhosis, BUN 10 creatinine 0.80 CBC demonstrated normal white blood cell count 9700 and normal hemoglobin 13.6.     [name] 57 y.o.year old malewith medical history significant for alcoholic cirrhosis, DM 2 on insulin, PTSD, and chronic back pain (on MSIR) who presented on 3/16/2019with 2 weeks of abdominal swelling, worsening jaundice and somnolence and was found to have decompensated cirrhosis. He was intubated for airway protection and extubated on 3/20. He had a complicated hospital course : hypoxia and somnolence, empirically given SBP therapy (not enough asites to tap), recurrent hypoglycemia requiring IV dextrose, severe cellulitis of lower extremities treated with vancomycin, anuric AKI/ATN prompted discontinuation of Vancomyin (3/20), empiric IV albumin/midodrine/octreotide treatment (3/23-4/8), CRRT and eventually transition to intermittent HD( 4/2). Per renal, the patient has poor prognosis and it was unclear if he can tolerate HD long term. Thus palliative care was consulted. Transferred to [facility] service on 11/23/17     Decompensated alcoholic cirrhosis, Hepatic encephalopathy, Elevated LFTs, Thrombocytopenia -Not a Candidate for a Liver Transplant - Hepatitis panel negative - cont Lactulose and Rifaximin-  - follow platelets - has been educated in regards to ETOH abuse  AKI now on permanent dialysis/ hypotension>>>ESRD - suspected to be ATN and not HRS - CRRT 3/26 >> HD - s/p tunneled cath placement on 4/5, - - Palliative involved for GOC- At this point patient wants to continue with treatment   - Seen by vascular surgery- he does not have any targets for AV fistula creation and he has a risk of AVG clotting due to low BP - cont Midodrine - Appreciate Nephrology Recc's and they have started search for outpatient Dialysis centers - dialysis days now fixed>> TTS -  right IJ TDC - left arm AVG placed 4/18- cont Oxycodone PRN for this - continue with midodrine for blood pressure support during hemodialysis, perirenal patient is clipped to [facility] MWF second shift.  Macrocytic Anemia,  likely of chronic disease and alcohol abuse - has sufficient Iron, folate and B12 stores,  on Procrit During dialysis  Type 2 DM uncontrolled - Hemoglobin A1C4.4 (11/2017), Brittle diabetes ,  Some eye number secondary to diet noncompliance, Lantus dose was lowered from 37 on admission to 26 on discharge.  Thrombocytopenia - In the setting of liver disease, continue to monitor closely  Peripheral Neuropathy - diabetic and possibly alcoholic- severe pain in lower legs and feet -in an attempt to reduce his narcotic intake,was resumed on a lower dose gabapentin.  Chronic back Pain? - a shunt is following with pain clinic and [facility], given  He Started dialysis his pain regimen has been changed, he was started on fentanyl patch instead of long-acting morphine, And when necessary oxycodone, he was given fentanyl patch  and oxycodone prescription for 2 weeks until he is able to follow again with his pain clinic in [place].   NON COMPLIANCE WITH MEDS AND DIET  - was intermittently refusing to take medications        Discharge Condition: stable Discussed with wife at bedside   Follow UP  Follow-up Information    Angelia Mould, MD Follow up in 2 week(s).   Specialties:  Vascular Surgery, Cardiology Why:  Office will call you for appointment. Contact information: [address] [place] [address] [phone]             Discharge Instructions  and  Discharge Medications     Discharge Instructions    Discharge instructions   Complete by:  As directed    Follow with Primary MD Dettinger, Fransisca Kaufmann, MD in 7 days   Get CBC, CMP, 2 view Chest X ray checked  by Primary MD next visit.    Activity: As tolerated with Full fall precautions use walker/cane & assistance as needed   Disposition Home    Diet: Heart Healthy , CARB MODIFIED, RENAL DIET WITH 1500 Ml FLUID RESTRICTION, with feeding assistance and aspiration precautions.  For Heart failure  patients - Check your Weight same time everyday, if you gain over 2 pounds, or you develop in leg swelling, experience more shortness of breath or chest pain, call your Primary MD immediately. Follow Cardiac Low Salt Diet and 1.5 lit/day fluid restriction.   On your next visit with your primary care physician please Get Medicines reviewed and adjusted.   Please request your Prim.MD to go over all Hospital Tests and Procedure/Radiological results at the follow up, please get all Hospital records sent to your Prim MD by signing hospital release before you go home.   If you experience worsening of your admission symptoms, develop shortness of breath, life threatening emergency, suicidal or homicidal thoughts you must seek medical attention immediately by calling 911 or calling your MD immediately  if symptoms less severe.  You Must read complete instructions/literature along with all the possible adverse reactions/side effects for all the Medicines you take and that have been prescribed to you. Take any new Medicines after you have completely understood and accpet all the possible adverse reactions/side effects.   Do not drive, operating heavy machinery, perform activities at heights, swimming or participation in water activities or provide baby sitting services if your were admitted for syncope or siezures until you have seen by Primary MD or a Neurologist and advised to do so again.  Do not drive when taking Pain medications.    Do not take more than prescribed Pain, Sleep and Anxiety Medications  Special Instructions: If you have smoked or chewed Tobacco  in the last 2 yrs please stop smoking, stop any regular Alcohol  and or any Recreational drug use.  Wear Seat belts while driving.   Please note  You were cared for by a hospitalist during your hospital stay. If you have any questions about your discharge medications or the care you received while you were in the hospital after you are  discharged, you can call the unit and asked to speak with the hospitalist on call if the hospitalist that took care of you is not available. Once you are discharged, your primary care physician will handle any further medical issues. Please note that NO REFILLS for any discharge medications will be authorized once you are discharged, as it is imperative that you return to your  primary care physician (or establish a relationship with a primary care physician if you do not have one) for your aftercare needs so that they can reassess your need for medications and monitor your lab values.   Increase activity slowly   Complete by:  As directed      Allergies as of 12/13/2017      Reactions   Ketorolac Tromethamine Hives   Temsirolimus Other (See Comments)   Unknown - Pt does not recognize this medication [facility]      Medication List    STOP taking these medications   ibuprofen 200 MG tablet Commonly known as:  ADVIL,MOTRIN   Magnesium 500 MG Tabs   methocarbamol 500 MG tablet Commonly known as:  ROBAXIN   morphine 15 MG tablet Commonly known as:  MSIR   POTASSIUM PO     TAKE these medications   ACCU-CHEK AVIVA PLUS w/Device Kit 1 each by Does not apply route daily.   albuterol 108 (90 Base) MCG/ACT inhaler Commonly known as:  PROVENTIL HFA;VENTOLIN HFA Inhale 2 puffs into the lungs every 6 (six) hours as needed for wheezing or shortness of breath.   BLOOD GLUCOSE TEST STRIPS Strp 1 strip by In Vitro route 4 (four) times daily.   calcium carbonate (dosed in mg elemental calcium) 1250 MG/5ML Susp Take 5 mLs (500 mg of elemental calcium total) by mouth every 6 (six) hours as needed for indigestion.   carboxymethylcellulose 0.5 % Soln Commonly known as:  REFRESH PLUS Place 1 drop into both eyes 3 (three) times daily as needed (dry eyes).   fentaNYL 12 MCG/HR Commonly known as:  DURAGESIC - dosed mcg/hr Place 1 patch (12.5 mcg total) onto the skin every 3 (three)  days.   gabapentin 100 MG capsule Commonly known as:  NEURONTIN Take 2 capsules (200 mg total) by mouth daily. Start taking on:  12/14/2017 What changed:    medication strength  how much to take  when to take this   gabapentin 300 MG capsule Commonly known as:  NEURONTIN Take 1 capsule (300 mg total) by mouth every Monday, Wednesday, and Friday. Please take after hemodialysis Start taking on:  12/14/2017 What changed:  You were already taking a medication with the same name, and this prescription was added. Make sure you understand how and when to take each.   guaiFENesin 600 MG 12 hr tablet Commonly known as:  MUCINEX Take 2 tablets (1,200 mg total) by mouth 2 (two) times daily.   hydrochlorothiazide 25 MG tablet Commonly known as:  HYDRODIURIL Take 0.5 tablets (12.5 mg total) by mouth 2 (two) times daily.   insulin aspart 100 UNIT/ML FlexPen Commonly known as:  NOVOLOG FLEXPEN INJECT 7 TO 15 UNITS subcu up to 4 times daily What changed:  additional instructions   Insulin Glargine 100 UNIT/ML Solostar Pen Commonly known as:  LANTUS SOLOSTAR Inject 26 Units into the skin daily. What changed:  how much to take   lactulose 10 GM/15ML solution Commonly known as:  CHRONULAC Take 15 mLs (10 g total) by mouth 2 (two) times daily.   midodrine 10 MG tablet Commonly known as:  PROAMATINE Take 1 tablet (10 mg total) by mouth 3 (three) times daily with meals.   oxyCODONE 5 MG immediate release tablet Commonly known as:  Oxy IR/ROXICODONE Take 1 tablet (5 mg total) by mouth every 4 (four) hours as needed for severe pain (dyspnea).   pantoprazole 40 MG tablet Commonly known as:  PROTONIX Take 1  tablet (40 mg total) by mouth daily.   polyethylene glycol packet Commonly known as:  MIRALAX / GLYCOLAX Take 17 g by mouth 2 (two) times daily.   rifaximin 550 MG Tabs tablet Commonly known as:  XIFAXAN Take 1 tablet (550 mg total) by mouth 2 (two) times daily.              Durable Medical Equipment  (From admission, onward)        Start     Ordered   12/08/17 1349  For home use only DME 3 n 1  Once     12/08/17 1349   12/04/17 1311  For home use only DME lightweight manual wheelchair with seat cushion  Once    Comments:  Patient suffers from Acute on Chronic Back pain, ESRD which impairs their ability to perform daily activities like ambulating  in the home.  A cane will not resolve  issue with performing activities of daily living. A wheelchair will allow patient to safely perform daily activities. Patient is not able to propel themselves in the home using a standard weight wheelchair due to weakness . Patient can self propel in the lightweight wheelchair.  Accessories: elevating leg rests (ELRs), wheel locks, extensions and anti-tippers.   12/04/17 1316        Diet and Activity recommendation: See Discharge Instructions above   Consults obtained -   Nephrology  Palliative Care Medicine  Vascular Surgery      Major procedures and Radiology Reports - PLEASE review detailed and final reports for all details, in brief -   Intubation  CRRT  Dialysis cath insertion- temporary and then tunnelled  Vein mapping  AV graft left arm       US Abdomen Limited  Result Date: 11/17/2017 CLINICAL DATA:  Hepatic cirrhosis EXAM: ULTRASOUND ABDOMEN LIMITED RIGHT UPPER QUADRANT COMPARISON:  Abdominal and pelvic CT scan of November 13, 2017 FINDINGS: Gallbladder: The gallbladder is surgically absent. Common bile duct: Diameter: 5.3 mm Liver: The hepatic echotexture is mildly increased and is heterogeneous. The surface contour of the liver is fairly smooth. There is a small amount of ascites. No focal mass or ductal dilation is observed. Portal vein is patent on color Doppler imaging with normal direction of blood flow towards the liver. IMPRESSION: Ascites. Increased hepatic echotexture likely reflects cirrhosis. No suspicious hepatic masses. Previous  cholecystectomy.  Normal caliber common bile duct. Electronically Signed   By: David  Martinique M.D.   On: 11/17/2017 11:13   Ir Fluoro Guide Cv Line Right  Result Date: 11/27/2017 INDICATION: 57 year old male with end-stage renal disease on hemodialysis. He is currently dialyzing via a right IJ temporary hemodialysis catheter. He presents for placement of a tunneled hemodialysis catheter. EXAM: TUNNELED CENTRAL VENOUS HEMODIALYSIS CATHETER PLACEMENT WITH ULTRASOUND AND FLUOROSCOPIC GUIDANCE MEDICATIONS: 2 g Ancef. The antibiotic was given in an appropriate time interval prior to skin puncture. ANESTHESIA/SEDATION: Moderate (conscious) sedation was employed during this procedure. A total of Versed 2 mg and Fentanyl 100 mcg was administered intravenously. Moderate Sedation Time: 18 minutes. The patient's level of consciousness and vital signs were monitored continuously by radiology nursing throughout the procedure under my direct supervision. FLUOROSCOPY TIME:  Fluoroscopy Time: 0 minutes 6 seconds (0 mGy). COMPLICATIONS: None immediate. PROCEDURE: Informed written consent was obtained from the patient after a discussion of the risks, benefits, and alternatives to treatment. Questions regarding the procedure were encouraged and answered. The right neck and chest were prepped with chlorhexidine in a sterile fashion,  and a sterile drape was applied covering the operative field. Maximum barrier sterile technique with sterile gowns and gloves were used for the procedure. A timeout was performed prior to the initiation of the procedure. After creating a small venotomy incision, a micropuncture kit was utilized to access the right internal jugular vein under direct, real-time ultrasound guidance after the overlying soft tissues were anesthetized with 1% lidocaine with epinephrine. Ultrasound image documentation was performed. The microwire was kinked to measure appropriate catheter length. A stiff Glidewire was advanced  to the level of the IVC and the micropuncture sheath was exchanged for a peel-away sheath. A palindrome tunneled hemodialysis catheter measuring 19 cm from tip to cuff was tunneled in a retrograde fashion from the anterior chest wall to the venotomy incision. The catheter was then placed through the peel-away sheath with tips ultimately positioned within the superior aspect of the right atrium. Final catheter positioning was confirmed and documented with a spot radiographic image. The catheter aspirates and flushes normally. The catheter was flushed with appropriate volume heparin dwells. The catheter exit site was secured with a 0-Prolene retention suture. The venotomy incision was closed with Dermabond. Dressings were applied. The patient tolerated the procedure well without immediate post procedural complication. IMPRESSION: Successful placement of 19 cm tip to cuff tunneled hemodialysis catheter via the right internal jugular vein with tips terminating within the superior aspect of the right atrium. The catheter is ready for immediate use. The existing temporary hemodialysis catheter was removed. Signed, Criselda Peaches, MD [facility] Electronically Signed   By: Jacqulynn Cadet M.D.   On: 11/27/2017 16:56   Ir US Guide Vasc Access Right  Result Date: 11/27/2017 INDICATION: 57 year old male with end-stage renal disease on hemodialysis. He is currently dialyzing via a right IJ temporary hemodialysis catheter. He presents for placement of a tunneled hemodialysis catheter. EXAM: TUNNELED CENTRAL VENOUS HEMODIALYSIS CATHETER PLACEMENT WITH ULTRASOUND AND FLUOROSCOPIC GUIDANCE MEDICATIONS: 2 g Ancef. The antibiotic was given in an appropriate time interval prior to skin puncture. ANESTHESIA/SEDATION: Moderate (conscious) sedation was employed during this procedure. A total of Versed 2 mg and Fentanyl 100 mcg was administered intravenously. Moderate  Sedation Time: 18 minutes. The patient's level of consciousness and vital signs were monitored continuously by radiology nursing throughout the procedure under my direct supervision. FLUOROSCOPY TIME:  Fluoroscopy Time: 0 minutes 6 seconds (0 mGy). COMPLICATIONS: None immediate. PROCEDURE: Informed written consent was obtained from the patient after a discussion of the risks, benefits, and alternatives to treatment. Questions regarding the procedure were encouraged and answered. The right neck and chest were prepped with chlorhexidine in a sterile fashion, and a sterile drape was applied covering the operative field. Maximum barrier sterile technique with sterile gowns and gloves were used for the procedure. A timeout was performed prior to the initiation of the procedure. After creating a small venotomy incision, a micropuncture kit was utilized to access the right internal jugular vein under direct, real-time ultrasound guidance after the overlying soft tissues were anesthetized with 1% lidocaine with epinephrine. Ultrasound image documentation was performed. The microwire was kinked to measure appropriate catheter length. A stiff Glidewire was advanced to the level of the IVC and the micropuncture sheath was exchanged for a peel-away sheath. A palindrome tunneled hemodialysis catheter measuring 19 cm from tip to cuff was tunneled in a retrograde fashion from the anterior chest wall to the venotomy incision. The catheter was then placed through the peel-away sheath with tips ultimately  positioned within the superior aspect of the right atrium. Final catheter positioning was confirmed and documented with a spot radiographic image. The catheter aspirates and flushes normally. The catheter was flushed with appropriate volume heparin dwells. The catheter exit site was secured with a 0-Prolene retention suture. The venotomy incision was closed with Dermabond. Dressings were applied. The patient tolerated the procedure  well without immediate post procedural complication. IMPRESSION: Successful placement of 19 cm tip to cuff tunneled hemodialysis catheter via the right internal jugular vein with tips terminating within the superior aspect of the right atrium. The catheter is ready for immediate use. The existing temporary hemodialysis catheter was removed. Signed, Criselda Peaches, MD [facility] Electronically Signed   By: Jacqulynn Cadet M.D.   On: 11/27/2017 16:56   Dg Chest Port 1 View  Result Date: 11/22/2017 CLINICAL DATA:  Respiratory failure EXAM: PORTABLE CHEST 1 VIEW COMPARISON:  11/16/2017 and prior exams FINDINGS: Cardiomediastinal silhouette is unchanged. An endotracheal tube and NG tube have been removed. A RIGHT IJ central venous catheter with tip overlying the mid SVC again noted. Mild bilateral interstitial opacities and LEFT basilar atelectasis again noted. There is no evidence of pneumothorax. IMPRESSION: Endotracheal tube and NG tube removal. Otherwise unchanged appearance of the chest. Electronically Signed   By: Margarette Canada M.D.   On: 11/22/2017 07:17   Portable Chest X-ray  Result Date: 11/16/2017 CLINICAL DATA:  ETT placement EXAM: PORTABLE CHEST 1 VIEW COMPARISON:  11/16/2017, 11/11/2017, 11/08/2017, CT chest 10/16/2017 FINDINGS: Endotracheal tube tip is at or just above the carina. Right-sided central venous catheter tip overlies the SVC. Esophageal tube tip is below the diaphragm but not included. Stable enlarged cardiomediastinal silhouette. Similar foci of ground-glass opacity in the upper lobes with streaky atelectasis at the left base. Old rib fractures. IMPRESSION: 1. Endotracheal tube tip at or just above the carina 2. Stable enlarged cardiomediastinal silhouette with persistent left basilar atelectasis. Mild diffuse interstitial opacity suggesting minimal edema; focal ground-glass opacities in the upper lobes may reflect  superimposed foci of infection or inflammation. Electronically Signed   By: Donavan Foil M.D.   On: 11/16/2017 18:17   Dg Chest Port 1 View  Result Date: 11/16/2017 CLINICAL DATA:  Central line placement EXAM: PORTABLE CHEST 1 VIEW COMPARISON:  Portable exam 1630 hours compared to 11/14/2017 FINDINGS: RIGHT jugular central venous catheter with tip projecting over SVC. Enlargement of cardiac silhouette with pulmonary vascular congestion. Mild chronic interstitial infiltrates likely pulmonary edema, see question slightly increased in LEFT upper lobe. No pleural effusion or pneumothorax. Bones demineralized with malleable plates again identified at the sternum. Old LEFT rib fractures. IMPRESSION: No pneumothorax following RIGHT jugular line placement. Probable mild pulmonary edema, question minimally increased Electronically Signed   By: Lavonia Dana M.D.   On: 11/16/2017 17:09   Dg Chest Port 1 View  Result Date: 11/14/2017 CLINICAL DATA:  Hypoxia. EXAM: PORTABLE CHEST 1 VIEW COMPARISON:  11/11/2017.  Chest CT dated 10/16/2017. FINDINGS: Stable enlarged cardiac silhouette and prominence of the interstitial markings. Increased prominence of the pulmonary vasculature. Mild decrease in patchy opacity at the left lung base. Stable sternal plates and screws. Previously demonstrated old bilateral rib fractures. IMPRESSION: 1. Mildly improved left lower lobe atelectasis or pneumonia. 2. Interval mild pulmonary vascular congestion. 3. Stable cardiomegaly and chronic interstitial lung disease. Electronically Signed   By: Claudie Revering M.D.   On: 11/14/2017 09:44   Dg Abd Portable 1v  Result Date: 11/16/2017  CLINICAL DATA:  OG tube placement EXAM: PORTABLE ABDOMEN - 1 VIEW COMPARISON:  CT 10/16/2017 FINDINGS: Esophageal tube tip is coiled in the region of the distal stomach. Surgical clips in the right upper quadrant. Gas-filled loops of bowel in the central abdomen. Treated compression of the spine. IMPRESSION:  Esophageal tube tip is coiled within the distal stomach. Electronically Signed   By: Donavan Foil M.D.   On: 11/16/2017 18:18   Dg Abd Portable 1v  Result Date: 11/16/2017 CLINICAL DATA:  Abdominal distension EXAM: PORTABLE ABDOMEN - 1 VIEW COMPARISON:  Portable exam 1628 hours compared to CT abdomen and pelvis 10/16/2017 FINDINGS: Gaseous distention of stomach. Air-filled normal sized small bowel loops in mid abdomen. Paucity of colonic gas. No definite bowel wall thickening or obstruction. Bones demineralized with old appearing compression deformities at T12 and L1 and prior spinal augmentation procedure at L2. IMPRESSION: Gaseous distention of stomach. Electronically Signed   By: Lavonia Dana M.D.   On: 11/16/2017 17:08   Dg Swallowing Func-speech Pathology  Result Date: 11/21/2017 Objective Swallowing Evaluation: Type of Study: MBS-Modified Barium Swallow Study  Patient Details Name: MERIL DRAY MRN: [id number] Date of Birth: 06-24-1961 Today's Date: 11/21/2017 Time: SLP Start Time (ACUTE ONLY): 6761 -SLP Stop Time (ACUTE ONLY): 1233 SLP Time Calculation (min) (ACUTE ONLY): 11 min Past Medical History: Past Medical History: Diagnosis Date . Chronic lower back pain  . Compression fracture   lumbar 3 . Diabetes mellitus  . Fracture acetabulum-closed ([place]) 04/09/2013 . GERD (gastroesophageal reflux disease)  . H/O Legionnaire's disease  . Hepatomegaly  . Migraine  . Osteoporosis  . Pancreatitis  . PTSD (post-traumatic stress disorder)  . Vitamin D deficiency  Past Surgical History: Past Surgical History: Procedure Laterality Date . BACK SURGERY   . CHOLECYSTECTOMY   . HERNIA REPAIR   . KYPHOPLASTY N/A 07/16/2015  Procedure: Lumbar three kyphoplasty;  Surgeon: Ashok Pall, MD;  Location: [facility];  Service: Neurosurgery;  Laterality: N/A;  Lumbar three kyphoplasty . LAMINECTOMY   . MASS EXCISION Left 06/12/2017  Procedure: EXCISION LEFT AXILLARY SEBACEOUS CYST;  Surgeon: Coralie Keens, MD;   Location: [facility];  Service: General;  Laterality: Left; . STERNUM FRACTURE SURGERY   HPI: Pt is a 57 yo male who presented with decompensated cirrhosis with hepatic encephalopathy. PMHx includes DM, PTSD, compression fx's and pain/ debilitation, GERD, EtOH abuse with cirrhosis. Pt recently hospitalized in Feb 2019 for flu. Previously seen by SLP in 2016 following intubation, with FEES revealing reversible dysphagia and pt was eventually d/c from SLP on regular solid and thin liquid diet. Intubated 3/17-3/20 and re-intubated 3/25-3/28. Pt currently on CRRT and nasal canula  Subjective: alert, eager for POs Assessment / Plan / Recommendation CHL IP CLINICAL IMPRESSIONS 11/21/2017 Clinical Impression Pt has a mild oropharyngeal dysphagia that is likely multifactorial given recent intubations and generalized deconditioning, also impacted by his altered mentation although this does seem to be improving. Pt has mildly decreased bolus cohesion, allowing thin liquids to spill prematurely as far as the pyriform sinuses before triggering a swallow. He maintains good airway protection even across challenging with intermittent penetration that clears the laryngeal vestibule upon completion of the swallow. No aspiration is observed. Pt also has reduced base of tongue retraction that leaves mild vallecular residue after the swallow. Given presence of residue and considering mentation that has been fluctuating, recommend starting with Dys 3 diet and thin liquids. SLP will follow for tolerance and potential to advance. SLP Visit  Diagnosis Dysphagia, oropharyngeal phase (R13.12) Attention and concentration deficit following -- Frontal lobe and executive function deficit following -- Impact on safety and function Mild aspiration risk   CHL IP TREATMENT RECOMMENDATION 11/21/2017 Treatment Recommendations Therapy as outlined in treatment plan below   Prognosis 11/21/2017 Prognosis for Safe Diet Advancement Good Barriers  to Reach Goals -- Barriers/Prognosis Comment -- CHL IP DIET RECOMMENDATION 11/21/2017 SLP Diet Recommendations Dysphagia 3 (Mech soft) solids;Thin liquid Liquid Administration via Cup;Straw Medication Administration Whole meds with liquid Compensations Minimize environmental distractions;Slow rate;Small sips/bites Postural Changes Seated upright at 90 degrees   CHL IP OTHER RECOMMENDATIONS 11/21/2017 Recommended Consults -- Oral Care Recommendations Oral care BID Other Recommendations --   CHL IP FOLLOW UP RECOMMENDATIONS 11/21/2017 Follow up Recommendations None   CHL IP FREQUENCY AND DURATION 11/21/2017 Speech Therapy Frequency (ACUTE ONLY) min 2x/week Treatment Duration 2 weeks      CHL IP ORAL PHASE 11/21/2017 Oral Phase Impaired Oral - Pudding Teaspoon -- Oral - Pudding Cup -- Oral - Honey Teaspoon -- Oral - Honey Cup -- Oral - Nectar Teaspoon -- Oral - Nectar Cup -- Oral - Nectar Straw -- Oral - Thin Teaspoon -- Oral - Thin Cup Decreased bolus cohesion;Premature spillage Oral - Thin Straw Decreased bolus cohesion;Premature spillage Oral - Puree WFL Oral - Mech Soft WFL Oral - Regular -- Oral - Multi-Consistency -- Oral - Pill WFL Oral Phase - Comment --  CHL IP PHARYNGEAL PHASE 11/21/2017 Pharyngeal Phase Impaired Pharyngeal- Pudding Teaspoon -- Pharyngeal -- Pharyngeal- Pudding Cup -- Pharyngeal -- Pharyngeal- Honey Teaspoon -- Pharyngeal -- Pharyngeal- Honey Cup -- Pharyngeal -- Pharyngeal- Nectar Teaspoon -- Pharyngeal -- Pharyngeal- Nectar Cup -- Pharyngeal -- Pharyngeal- Nectar Straw -- Pharyngeal -- Pharyngeal- Thin Teaspoon -- Pharyngeal -- Pharyngeal- Thin Cup Reduced tongue base retraction;Pharyngeal residue - valleculae;Penetration/Aspiration during swallow Pharyngeal Material enters airway, remains ABOVE vocal cords then ejected out Pharyngeal- Thin Straw Reduced tongue base retraction;Pharyngeal residue - valleculae;Penetration/Aspiration during swallow Pharyngeal Material enters airway, remains ABOVE  vocal cords then ejected out Pharyngeal- Puree Reduced tongue base retraction;Pharyngeal residue - valleculae Pharyngeal -- Pharyngeal- Mechanical Soft Reduced tongue base retraction;Pharyngeal residue - valleculae Pharyngeal -- Pharyngeal- Regular -- Pharyngeal -- Pharyngeal- Multi-consistency -- Pharyngeal -- Pharyngeal- Pill Reduced tongue base retraction Pharyngeal -- Pharyngeal Comment --  CHL IP CERVICAL ESOPHAGEAL PHASE 11/21/2017 Cervical Esophageal Phase WFL Pudding Teaspoon -- Pudding Cup -- Honey Teaspoon -- Honey Cup -- Nectar Teaspoon -- Nectar Cup -- Nectar Straw -- Thin Teaspoon -- Thin Cup -- Thin Straw -- Puree -- Mechanical Soft -- Regular -- Multi-consistency -- Pill -- Cervical Esophageal Comment -- No flowsheet data found. Germain Osgood 11/21/2017, 2:20 PM  Germain Osgood, M.A. CCC-SLP [phone]              Micro Results    No results found for this or any previous visit (from the past 240 hour(s)).     Today   Subjective:   Decker Cogdell today has no headache,no chest or  abdominal pain,no new weakness tingling or numbness, feels much better wants to go home today.   Objective:   Blood pressure 95/64, pulse 87, temperature 98.4 F (36.9 C), temperature source Oral, resp. rate 20, height 5' 6" (1.676 m), weight 71.8 kg (158 lb 6.4 oz), SpO2 95 %.   Intake/Output Summary (Last 24 hours) at 12/13/2017 1224 Last data filed at 12/13/2017 0436 Gross per 24 hour  Intake 183 ml  Output 50 ml  Net 133 ml  Exam During recliner, eating breakfast in no apparent distress Clear to auscultation, no use of accessory muscles, no wheezing S1 & S2 heard, RRR.   Abdomen soft, non-tender, nondistended. Normal bowel sound. No organomegaly Left arm bruising, tenderness after AVG, mild swelling.lower extremity with some edema and chronic discoloration Psychiatry:  Mood & affect appropriate    Data Review   CBC w Diff:  Lab Results  Component Value Date   WBC 8.0  12/12/2017   HGB 9.4 (L) 12/12/2017   HGB 13.1 10/22/2017   HCT 28.8 (L) 12/12/2017   HCT 40.3 10/22/2017   PLT 83 (L) 12/12/2017   PLT 107 (L) 10/22/2017   LYMPHOPCT 36 12/09/2017   BANDSPCT 12 11/24/2017   MONOPCT 11 12/09/2017   EOSPCT 4 12/09/2017   BASOPCT 1 12/09/2017    CMP:  Lab Results  Component Value Date   NA 136 12/12/2017   NA 137 10/22/2017   K 4.2 12/12/2017   CL 103 12/12/2017   CO2 19 (L) 12/12/2017   BUN 45 (H) 12/12/2017   BUN 5 (L) 10/22/2017   CREATININE 6.00 (H) 12/12/2017   PROT 6.5 12/09/2017   PROT 6.0 10/22/2017   ALBUMIN 3.3 (L) 12/12/2017   ALBUMIN 2.6 (L) 10/22/2017   BILITOT 5.9 (H) 12/09/2017   BILITOT 4.4 (H) 10/22/2017   ALKPHOS 153 (H) 12/09/2017   AST 81 (H) 12/09/2017   ALT 16 (L) 12/09/2017  .   Total Time in preparing paper work, data evaluation and todays exam - 78 minutes  Phillips Climes M.D on 12/13/2017 at 12:24 PM  [facility]   Office  [phone]

## 2017-12-14 ENCOUNTER — Telehealth: Payer: Self-pay | Admitting: *Deleted

## 2017-12-14 ENCOUNTER — Telehealth: Payer: Self-pay | Admitting: Vascular Surgery

## 2017-12-14 DIAGNOSIS — N186 End stage renal disease: Secondary | ICD-10-CM | POA: Diagnosis not present

## 2017-12-14 NOTE — Telephone Encounter (Signed)
Unable to reach patient. Cell/home number has been disconnected. Other contact numbers are not available for the patient. Will attempt to reach his wife to schedule an appt for transitional care management.   Call Completed and Appointment Scheduled: Yes, Date: 12/22/2017 with Dr Dettinger   DISCHARGE INFORMATION Date of Discharge:12/13/2017  Discharge Facility: [facility]  Principal Discharge Diagnosis: Alcohol Abuse, acute respiratory failure with hypoxia, ESRD with acute renal failure  Patient and/or caregiver is knowledgeable of his/her condition(s) and treatment: Yes. Spoke with patient's wife  MEDICATION RECONCILIATION Current medication list reviewed with patient:Yes  Outpatient Encounter Medications as of 12/14/2017  Medication Sig  . albuterol (PROVENTIL HFA;VENTOLIN HFA) 108 (90 Base) MCG/ACT inhaler Inhale 2 puffs into the lungs every 6 (six) hours as needed for wheezing or shortness of breath.  . Blood Glucose Monitoring Suppl (ACCU-CHEK AVIVA PLUS) w/Device KIT 1 each by Does not apply route daily.  . Calcium Carbonate Antacid (CALCIUM CARBONATE, DOSED IN MG ELEMENTAL CALCIUM,) 1250 MG/5ML SUSP Take 5 mLs (500 mg of elemental calcium total) by mouth every 6 (six) hours as needed for indigestion.  . carboxymethylcellulose (REFRESH PLUS) 0.5 % SOLN Place 1 drop into both eyes 3 (three) times daily as needed (dry eyes).  . fentaNYL (DURAGESIC - DOSED MCG/HR) 12 MCG/HR Place 1 patch (12.5 mcg total) onto the skin every 3 (three) days.  Marland Kitchen gabapentin (NEURONTIN) 100 MG capsule Take 2 capsules (200 mg total) by mouth daily.  Marland Kitchen gabapentin (NEURONTIN) 300 MG capsule Take 1 capsule (300 mg total) by mouth every Monday, Wednesday, and Friday. Please take after hemodialysis  . Glucose Blood (BLOOD GLUCOSE TEST STRIPS) STRP 1 strip by In Vitro route 4 (four) times daily.  Marland Kitchen guaiFENesin (MUCINEX) 600 MG 12 hr tablet Take 2 tablets (1,200 mg total) by mouth 2 (two) times daily.  .  hydrochlorothiazide (HYDRODIURIL) 25 MG tablet Take 0.5 tablets (12.5 mg total) by mouth 2 (two) times daily.  . insulin aspart (NOVOLOG FLEXPEN) 100 UNIT/ML FlexPen INJECT 7 TO 15 UNITS subcu up to 4 times daily (Patient taking differently: INJECT 9 - 17 UNITS SQ UP TO FOUR TIMES A DAY)  . Insulin Glargine (LANTUS SOLOSTAR) 100 UNIT/ML Solostar Pen Inject 26 Units into the skin daily.  Marland Kitchen lactulose (CHRONULAC) 10 GM/15ML solution Take 15 mLs (10 g total) by mouth 2 (two) times daily.  . midodrine (PROAMATINE) 10 MG tablet Take 1 tablet (10 mg total) by mouth 3 (three) times daily with meals.  Marland Kitchen oxyCODONE (OXY IR/ROXICODONE) 5 MG immediate release tablet Take 1 tablet (5 mg total) by mouth every 4 (four) hours as needed for severe pain (dyspnea).  . pantoprazole (PROTONIX) 40 MG tablet Take 1 tablet (40 mg total) by mouth daily.  . polyethylene glycol (MIRALAX / GLYCOLAX) packet Take 17 g by mouth 2 (two) times daily.  . rifaximin (XIFAXAN) 550 MG TABS tablet Take 1 tablet (550 mg total) by mouth 2 (two) times daily.   No facility-administered encounter medications on file as of 12/14/2017.     Discharge Medications reviewed and reconciled with current medications.yes  Patient is able to obtain needed medications:Yes  ACTIVITIES OF DAILY LIVING  Is the patient able to perform his/her own ADLs: Yes.    Patient is receiving home health services: Yes.  Home health has been in contact but has not been out for a visit yet  PATIENT EDUCATION Questions/Concerns Discussed: No questions or concerns at this time  Chong Sicilian, RN

## 2017-12-14 NOTE — Telephone Encounter (Signed)
-----   Message from Sharee PimpleMarilyn K McChesney, RN sent at 12/10/2017  4:45 PM EDT ----- Regarding: 2-3 weeks   ----- Message ----- From: Rema Fendtastad, Laura K, Student-PA Sent: 12/10/2017  11:25 AM To: [facility] Charge Pool  S/p left AVG, needs follow up in PA clinic or with Dr. Edilia Boickson in 2 weeks.

## 2017-12-14 NOTE — Telephone Encounter (Signed)
Sched appt 12/30/17 at 4:00 with PA. Spoke to pt's wife, pt has pm dialysis MWF; resched appt 12/31/17 at 3:00 with "PA TO SEE".

## 2017-12-16 DIAGNOSIS — N186 End stage renal disease: Secondary | ICD-10-CM | POA: Diagnosis not present

## 2017-12-18 DIAGNOSIS — N186 End stage renal disease: Secondary | ICD-10-CM | POA: Diagnosis not present

## 2017-12-19 DIAGNOSIS — K7030 Alcoholic cirrhosis of liver without ascites: Secondary | ICD-10-CM | POA: Diagnosis not present

## 2017-12-19 DIAGNOSIS — I2510 Atherosclerotic heart disease of native coronary artery without angina pectoris: Secondary | ICD-10-CM | POA: Diagnosis not present

## 2017-12-19 DIAGNOSIS — N186 End stage renal disease: Secondary | ICD-10-CM | POA: Diagnosis not present

## 2017-12-19 DIAGNOSIS — E1165 Type 2 diabetes mellitus with hyperglycemia: Secondary | ICD-10-CM | POA: Diagnosis not present

## 2017-12-19 DIAGNOSIS — E1122 Type 2 diabetes mellitus with diabetic chronic kidney disease: Secondary | ICD-10-CM | POA: Diagnosis not present

## 2017-12-19 DIAGNOSIS — K219 Gastro-esophageal reflux disease without esophagitis: Secondary | ICD-10-CM | POA: Diagnosis not present

## 2017-12-19 DIAGNOSIS — M545 Low back pain: Secondary | ICD-10-CM | POA: Diagnosis not present

## 2017-12-19 DIAGNOSIS — F1010 Alcohol abuse, uncomplicated: Secondary | ICD-10-CM | POA: Diagnosis not present

## 2017-12-19 DIAGNOSIS — S81802D Unspecified open wound, left lower leg, subsequent encounter: Secondary | ICD-10-CM | POA: Diagnosis not present

## 2017-12-21 DIAGNOSIS — N186 End stage renal disease: Secondary | ICD-10-CM | POA: Diagnosis not present

## 2017-12-22 DIAGNOSIS — E1122 Type 2 diabetes mellitus with diabetic chronic kidney disease: Secondary | ICD-10-CM | POA: Diagnosis not present

## 2017-12-22 DIAGNOSIS — M545 Low back pain: Secondary | ICD-10-CM | POA: Diagnosis not present

## 2017-12-22 DIAGNOSIS — E1165 Type 2 diabetes mellitus with hyperglycemia: Secondary | ICD-10-CM | POA: Diagnosis not present

## 2017-12-22 DIAGNOSIS — K7030 Alcoholic cirrhosis of liver without ascites: Secondary | ICD-10-CM | POA: Diagnosis not present

## 2017-12-22 DIAGNOSIS — I2510 Atherosclerotic heart disease of native coronary artery without angina pectoris: Secondary | ICD-10-CM | POA: Diagnosis not present

## 2017-12-22 DIAGNOSIS — N186 End stage renal disease: Secondary | ICD-10-CM | POA: Diagnosis not present

## 2017-12-22 DIAGNOSIS — F1010 Alcohol abuse, uncomplicated: Secondary | ICD-10-CM | POA: Diagnosis not present

## 2017-12-22 DIAGNOSIS — S81802D Unspecified open wound, left lower leg, subsequent encounter: Secondary | ICD-10-CM | POA: Diagnosis not present

## 2017-12-22 DIAGNOSIS — K219 Gastro-esophageal reflux disease without esophagitis: Secondary | ICD-10-CM | POA: Diagnosis not present

## 2017-12-23 DIAGNOSIS — E8770 Fluid overload, unspecified: Secondary | ICD-10-CM | POA: Diagnosis not present

## 2017-12-23 DIAGNOSIS — N186 End stage renal disease: Secondary | ICD-10-CM | POA: Diagnosis not present

## 2017-12-24 ENCOUNTER — Ambulatory Visit (INDEPENDENT_AMBULATORY_CARE_PROVIDER_SITE_OTHER): Payer: Medicare HMO | Admitting: Family Medicine

## 2017-12-24 ENCOUNTER — Encounter: Payer: Self-pay | Admitting: Family Medicine

## 2017-12-24 VITALS — BP 122/72 | HR 84 | Temp 98.6°F | Ht 66.0 in | Wt 165.2 lb

## 2017-12-24 DIAGNOSIS — Z992 Dependence on renal dialysis: Secondary | ICD-10-CM

## 2017-12-24 DIAGNOSIS — K7031 Alcoholic cirrhosis of liver with ascites: Secondary | ICD-10-CM

## 2017-12-24 DIAGNOSIS — K7030 Alcoholic cirrhosis of liver without ascites: Secondary | ICD-10-CM

## 2017-12-24 DIAGNOSIS — N186 End stage renal disease: Secondary | ICD-10-CM | POA: Diagnosis not present

## 2017-12-24 DIAGNOSIS — D696 Thrombocytopenia, unspecified: Secondary | ICD-10-CM | POA: Diagnosis not present

## 2017-12-24 DIAGNOSIS — J9601 Acute respiratory failure with hypoxia: Secondary | ICD-10-CM | POA: Diagnosis not present

## 2017-12-24 DIAGNOSIS — E8770 Fluid overload, unspecified: Secondary | ICD-10-CM | POA: Diagnosis not present

## 2017-12-24 DIAGNOSIS — N179 Acute kidney failure, unspecified: Secondary | ICD-10-CM | POA: Diagnosis not present

## 2017-12-24 NOTE — Progress Notes (Signed)
BP 122/72   Pulse 84   Temp 98.6 F (37 C) (Oral)   Ht _0  (1.676 m)   Wt 165 lb 3.2 oz (74.9 kg)   BMI 26.66 kg/m    Subjective:    Patient ID: [name], male    DOB: 1961/06/09, 57 y.o.   MRN: [id number]  HPI: [name] is a 57 y.o. male presenting on 12/24/2017 for Hospitalization Follow-up (Transitional Care )   HPI Hospital follow-up for alcoholic cirrhosis and liver failure Patient has a hospital follow-up for alcoholic cirrhosis and liver failure.  Patient is also in acute respiratory failure and acute renal failure and has been started on dialysis.  Patient was also found to be slightly thrombocytopenic which he has been more chronically, we will recheck this.  He says today in the office his breathing is been a lot better and he really is feeling a lot better than when he went in.  He denies any major flapping or tremors and denies any significant new swelling and says he is doing better in that department.  He relatively feels a lot better than he did when he went into the hospital.  He has been getting dialysis 3 times a day which has been helping reduce his numbers.  Patient stopped taking his lactulose because he already has diarrhea 4 times a day  Patient has a meld score of 30 which put him at a 52.86% chance of mortality in the next 3 months.  This was discussed with patient and information was given for hospice.  Relevant past medical, surgical, family and social history reviewed and updated as indicated. Interim medical history since our last visit reviewed. Allergies and medications reviewed and updated.  Review of Systems  Constitutional: Negative for chills and fever.  Eyes: Negative for discharge.  Respiratory: Negative for shortness of breath and wheezing.   Cardiovascular: Negative for chest pain and leg swelling.  Gastrointestinal: Positive for abdominal distention and diarrhea. Negative for abdominal pain, nausea and vomiting.    Musculoskeletal: Negative for back pain and gait problem.  Skin: Negative for rash.  All other systems reviewed and are negative.   Per HPI unless specifically indicated above   Allergies as of 12/24/2017      Reactions   Ketorolac Tromethamine Hives   Temsirolimus Other (See Comments)   Unknown - Pt does not recognize this medication [facility]      Medication List        Accurate as of 12/24/17  4:52 PM. Always use your most recent med list.          ACCU-CHEK AVIVA PLUS w/Device Kit 1 each by Does not apply route daily.   albuterol 108 (90 Base) MCG/ACT inhaler Commonly known as:  PROVENTIL HFA;VENTOLIN HFA Inhale 2 puffs into the lungs every 6 (six) hours as needed for wheezing or shortness of breath.   BLOOD GLUCOSE TEST STRIPS Strp 1 strip by In Vitro route 4 (four) times daily.   calcium carbonate (dosed in mg elemental calcium) 1250 MG/5ML Susp Take 5 mLs (500 mg of elemental calcium total) by mouth every 6 (six) hours as needed for indigestion.   carboxymethylcellulose 0.5 % Soln Commonly known as:  REFRESH PLUS Place 1 drop into both eyes 3 (three) times daily as needed (dry eyes).   fentaNYL 12 MCG/HR Commonly known as:  DURAGESIC - dosed mcg/hr Place 1 patch (12.5 mcg total) onto the skin every 3 (three) days.  gabapentin 100 MG capsule Commonly known as:  NEURONTIN Take 2 capsules (200 mg total) by mouth daily.   gabapentin 300 MG capsule Commonly known as:  NEURONTIN Take 1 capsule (300 mg total) by mouth every Monday, Wednesday, and Friday. Please take after hemodialysis   guaiFENesin 600 MG 12 hr tablet Commonly known as:  MUCINEX Take 2 tablets (1,200 mg total) by mouth 2 (two) times daily.   hydrochlorothiazide 25 MG tablet Commonly known as:  HYDRODIURIL Take 0.5 tablets (12.5 mg total) by mouth 2 (two) times daily.   insulin aspart 100 UNIT/ML FlexPen Commonly known as:  NOVOLOG FLEXPEN INJECT 7 TO 15 UNITS subcu up to 4 times  daily   Insulin Glargine 100 UNIT/ML Solostar Pen Commonly known as:  LANTUS SOLOSTAR Inject 26 Units into the skin daily.   lactulose 10 GM/15ML solution Commonly known as:  CHRONULAC Take 15 mLs (10 g total) by mouth 2 (two) times daily.   midodrine 10 MG tablet Commonly known as:  PROAMATINE Take 1 tablet (10 mg total) by mouth 3 (three) times daily with meals.   oxyCODONE 5 MG immediate release tablet Commonly known as:  Oxy IR/ROXICODONE Take 1 tablet (5 mg total) by mouth every 4 (four) hours as needed for severe pain (dyspnea).   pantoprazole 40 MG tablet Commonly known as:  PROTONIX Take 1 tablet (40 mg total) by mouth daily.   rifaximin 550 MG Tabs tablet Commonly known as:  XIFAXAN Take 1 tablet (550 mg total) by mouth 2 (two) times daily.          Objective:    BP 122/72   Pulse 84   Temp 98.6 F (37 C) (Oral)   Ht _0  (1.676 m)   Wt 165 lb 3.2 oz (74.9 kg)   BMI 26.66 kg/m   Wt Readings from Last 3 Encounters:  12/24/17 165 lb 3.2 oz (74.9 kg)  12/13/17 158 lb 6.4 oz (71.8 kg)  10/22/17 197 lb (89.4 kg)    Physical Exam  Constitutional: He is oriented to person, place, and time. He appears well-developed and well-nourished. No distress.  Patient is slightly jaundiced as he normally is.  Eyes: Pupils are equal, round, and reactive to light. Conjunctivae and EOM are normal. Right eye exhibits no discharge. Scleral icterus is present.  Neck: Neck supple. No thyromegaly present.  Cardiovascular: Normal rate, regular rhythm, normal heart sounds and intact distal pulses.  No murmur heard. Pulmonary/Chest: Effort normal and breath sounds normal. No respiratory distress. He has no wheezes.  Abdominal: Soft. Bowel sounds are normal. He exhibits distension (Ascites). There is no tenderness.  Musculoskeletal: Normal range of motion. He exhibits edema (21+ edema bilateral lower extremities).  Lymphadenopathy:    He has no cervical adenopathy.  Neurological:  He is alert and oriented to person, place, and time. Coordination normal.  Skin: Skin is warm and dry. No rash noted. He is not diaphoretic.  Psychiatric: He has a normal mood and affect. His behavior is normal.  Nursing note and vitals reviewed.       Assessment & Plan:   Problem List Items Addressed This Visit      Respiratory   Acute respiratory failure with hypoxia ([place])   Relevant Orders   CBC with Differential/Platelet (Completed)   CMP14+EGFR (Completed)     Digestive   CIRRHOSIS, ALCOHOLIC - Primary   Relevant Orders   CBC with Differential/Platelet (Completed)   CMP14+EGFR (Completed)   Ammonia (Completed)   CBC with  Differential/Platelet (Completed)   CMP14+EGFR (Completed)   Ammonia (Completed)   Cirrhosis ([facility])   Relevant Orders   CBC with Differential/Platelet (Completed)   CMP14+EGFR (Completed)   Ammonia (Completed)   CBC with Differential/Platelet (Completed)   CMP14+EGFR (Completed)   Ammonia (Completed)     Genitourinary   Acute renal failure (ARF) ([facility])   Relevant Orders   CBC with Differential/Platelet (Completed)   CMP14+EGFR (Completed)   ESRD (end stage renal disease) on dialysis ([place])   Relevant Orders   CBC with Differential/Platelet (Completed)   CMP14+EGFR (Completed)     Other   Thrombocytopenia ([place])   Relevant Orders   CBC with Differential/Platelet (Completed)   CMP14+EGFR (Completed)      Discussed hospice and palliative with the family, MELD score 30 which puts him at a 52.86% chance of mortality in the next 3 months  Follow up plan: Return if symptoms worsen or fail to improve.  Counseling provided for all of the vaccine components Orders Placed This Encounter  Procedures  . CBC with Differential/Platelet  . CMP14+EGFR  . Ammonia    Caryl Pina, MD [facility] 12/24/2017, 4:52 PM

## 2017-12-25 ENCOUNTER — Encounter (HOSPITAL_COMMUNITY): Payer: Self-pay

## 2017-12-25 ENCOUNTER — Other Ambulatory Visit: Payer: Self-pay

## 2017-12-25 ENCOUNTER — Emergency Department (HOSPITAL_COMMUNITY)
Admission: EM | Admit: 2017-12-25 | Discharge: 2017-12-25 | Disposition: A | Discharge status: Home / Self Care | Payer: Medicare HMO | Attending: Emergency Medicine | Admitting: Emergency Medicine

## 2017-12-25 DIAGNOSIS — E1122 Type 2 diabetes mellitus with diabetic chronic kidney disease: Secondary | ICD-10-CM | POA: Diagnosis not present

## 2017-12-25 DIAGNOSIS — S81802D Unspecified open wound, left lower leg, subsequent encounter: Secondary | ICD-10-CM | POA: Diagnosis not present

## 2017-12-25 DIAGNOSIS — Z87891 Personal history of nicotine dependence: Secondary | ICD-10-CM | POA: Diagnosis not present

## 2017-12-25 DIAGNOSIS — Z794 Long term (current) use of insulin: Secondary | ICD-10-CM | POA: Diagnosis not present

## 2017-12-25 DIAGNOSIS — Z79899 Other long term (current) drug therapy: Secondary | ICD-10-CM | POA: Insufficient documentation

## 2017-12-25 DIAGNOSIS — I120 Hypertensive chronic kidney disease with stage 5 chronic kidney disease or end stage renal disease: Secondary | ICD-10-CM | POA: Diagnosis not present

## 2017-12-25 DIAGNOSIS — R52 Pain, unspecified: Secondary | ICD-10-CM

## 2017-12-25 DIAGNOSIS — N186 End stage renal disease: Secondary | ICD-10-CM | POA: Diagnosis not present

## 2017-12-25 DIAGNOSIS — Z992 Dependence on renal dialysis: Secondary | ICD-10-CM | POA: Diagnosis not present

## 2017-12-25 DIAGNOSIS — E8770 Fluid overload, unspecified: Secondary | ICD-10-CM | POA: Diagnosis not present

## 2017-12-25 DIAGNOSIS — R0689 Other abnormalities of breathing: Secondary | ICD-10-CM | POA: Insufficient documentation

## 2017-12-25 DIAGNOSIS — E1165 Type 2 diabetes mellitus with hyperglycemia: Secondary | ICD-10-CM | POA: Diagnosis not present

## 2017-12-25 DIAGNOSIS — D696 Thrombocytopenia, unspecified: Secondary | ICD-10-CM | POA: Insufficient documentation

## 2017-12-25 DIAGNOSIS — I2510 Atherosclerotic heart disease of native coronary artery without angina pectoris: Secondary | ICD-10-CM | POA: Diagnosis not present

## 2017-12-25 DIAGNOSIS — K7030 Alcoholic cirrhosis of liver without ascites: Secondary | ICD-10-CM | POA: Diagnosis not present

## 2017-12-25 DIAGNOSIS — R531 Weakness: Secondary | ICD-10-CM | POA: Insufficient documentation

## 2017-12-25 DIAGNOSIS — F1010 Alcohol abuse, uncomplicated: Secondary | ICD-10-CM | POA: Diagnosis not present

## 2017-12-25 DIAGNOSIS — K219 Gastro-esophageal reflux disease without esophagitis: Secondary | ICD-10-CM | POA: Diagnosis not present

## 2017-12-25 DIAGNOSIS — M545 Low back pain: Secondary | ICD-10-CM | POA: Diagnosis not present

## 2017-12-25 LAB — CBC WITH DIFFERENTIAL/PLATELET
BASOS: 0 %
Basophils Absolute: 0 10*3/uL (ref 0.0–0.2)
Basophils Absolute: 0 10*3/uL (ref 0.0–0.1)
Basophils Relative: 0 %
EOS (ABSOLUTE): 0.1 10*3/uL (ref 0.0–0.4)
EOS ABS: 0.1 10*3/uL (ref 0.0–0.7)
EOS PCT: 1 %
EOS: 1 %
HCT: 34 % — ABNORMAL LOW (ref 39.0–52.0)
HEMATOCRIT: 34.9 % — AB (ref 37.5–51.0)
HEMOGLOBIN: 11 g/dL — AB (ref 13.0–17.7)
Hemoglobin: 10.4 g/dL — ABNORMAL LOW (ref 13.0–17.0)
Immature Grans (Abs): 0 10*3/uL (ref 0.0–0.1)
Immature Granulocytes: 0 %
LYMPHS ABS: 1.6 10*3/uL (ref 0.7–4.0)
Lymphocytes Absolute: 1.4 10*3/uL (ref 0.7–3.1)
Lymphocytes Relative: 19 %
Lymphs: 14 %
MCH: 32.1 pg (ref 26.6–33.0)
MCH: 31 pg (ref 26.0–34.0)
MCHC: 31.5 g/dL (ref 31.5–35.7)
MCHC: 30.6 g/dL (ref 30.0–36.0)
MCV: 102 fL — AB (ref 79–97)
MCV: 101.5 fL — ABNORMAL HIGH (ref 78.0–100.0)
MONOS ABS: 0.5 10*3/uL (ref 0.1–0.9)
MONOS PCT: 8 %
Monocytes Absolute: 0.7 10*3/uL (ref 0.1–1.0)
Monocytes: 5 %
NEUTROS ABS: 7.8 10*3/uL — AB (ref 1.4–7.0)
Neutro Abs: 6.1 10*3/uL (ref 1.7–7.7)
Neutrophils Relative %: 72 %
Neutrophils: 80 %
PLATELETS: 33 10*3/uL — AB (ref 150–400)
Platelets: 38 10*3/uL — CL (ref 150–379)
RBC: 3.43 x10E6/uL — ABNORMAL LOW (ref 4.14–5.80)
RBC: 3.35 MIL/uL — ABNORMAL LOW (ref 4.22–5.81)
RDW: 14.8 % (ref 12.3–15.4)
RDW: 15.3 % (ref 11.5–15.5)
WBC: 9.8 10*3/uL (ref 3.4–10.8)
WBC: 8.6 10*3/uL (ref 4.0–10.5)

## 2017-12-25 LAB — COMPREHENSIVE METABOLIC PANEL
ALT: 19 U/L (ref 17–63)
ANION GAP: 13 (ref 5–15)
AST: 42 U/L — ABNORMAL HIGH (ref 15–41)
Albumin: 2.9 g/dL — ABNORMAL LOW (ref 3.5–5.0)
Alkaline Phosphatase: 255 U/L — ABNORMAL HIGH (ref 38–126)
BILIRUBIN TOTAL: 4.7 mg/dL — AB (ref 0.3–1.2)
BUN: 11 mg/dL (ref 6–20)
CALCIUM: 8.1 mg/dL — AB (ref 8.9–10.3)
CO2: 26 mmol/L (ref 22–32)
CREATININE: 2.26 mg/dL — AB (ref 0.61–1.24)
Chloride: 95 mmol/L — ABNORMAL LOW (ref 101–111)
GFR, EST AFRICAN AMERICAN: 36 mL/min — AB (ref >60)
GFR, EST NON AFRICAN AMERICAN: 31 mL/min — AB (ref >60)
Glucose, Bld: 172 mg/dL — ABNORMAL HIGH (ref 65–99)
Potassium: 2.9 mmol/L — ABNORMAL LOW (ref 3.5–5.1)
SODIUM: 134 mmol/L — AB (ref 135–145)
TOTAL PROTEIN: 6.8 g/dL (ref 6.5–8.1)

## 2017-12-25 LAB — CMP14+EGFR
A/G RATIO: 1.1 — AB (ref 1.2–2.2)
ALBUMIN: 3.5 g/dL (ref 3.5–5.5)
ALT: 20 IU/L (ref 0–44)
AST: 54 IU/L — AB (ref 0–40)
Alkaline Phosphatase: 290 IU/L — ABNORMAL HIGH (ref 39–117)
BUN / CREAT RATIO: 5 — AB (ref 9–20)
BUN: 14 mg/dL (ref 6–24)
Bilirubin Total: 4.2 mg/dL — ABNORMAL HIGH (ref 0.0–1.2)
CALCIUM: 8.3 mg/dL — AB (ref 8.7–10.2)
CO2: 23 mmol/L (ref 20–29)
Chloride: 98 mmol/L (ref 96–106)
Creatinine, Ser: 2.84 mg/dL — ABNORMAL HIGH (ref 0.76–1.27)
GFR calc Af Amer: 28 mL/min/{1.73_m2} — ABNORMAL LOW (ref >59)
GFR calc non Af Amer: 24 mL/min/{1.73_m2} — ABNORMAL LOW (ref >59)
GLOBULIN, TOTAL: 3.3 g/dL (ref 1.5–4.5)
Glucose: 338 mg/dL — ABNORMAL HIGH (ref 65–99)
POTASSIUM: 4 mmol/L (ref 3.5–5.2)
SODIUM: 138 mmol/L (ref 134–144)
Total Protein: 6.8 g/dL (ref 6.0–8.5)

## 2017-12-25 LAB — BRAIN NATRIURETIC PEPTIDE: B Natriuretic Peptide: 162.2 pg/mL — ABNORMAL HIGH (ref 0.0–100.0)

## 2017-12-25 LAB — AMMONIA
Ammonia: 121 ug/dL (ref 27–102)
Ammonia: 77 umol/L — ABNORMAL HIGH (ref 9–35)

## 2017-12-25 LAB — ETHANOL: Alcohol, Ethyl (B): <10 mg/dL (ref <10)

## 2017-12-25 LAB — I-STAT CG4 LACTIC ACID, ED: Lactic Acid, Venous: 1.57 mmol/L (ref 0.5–1.9)

## 2017-12-25 LAB — LIPASE, BLOOD: LIPASE: 17 U/L (ref 11–51)

## 2017-12-25 MED ORDER — OXYCODONE HCL 5 MG PO TABS: 5.0000 mg | ORAL_TABLET | ORAL | 0 refills | Status: DC | PRN

## 2017-12-25 MED ORDER — HYDROMORPHONE HCL 2 MG/ML IJ SOLN
1.0000 mg | Freq: Once | INTRAMUSCULAR | Status: AC
  Administered 2017-12-25: 1 mg via INTRAVENOUS
  Filled 2017-12-25: qty 1

## 2017-12-25 NOTE — ED Triage Notes (Signed)
Pt recently admitted for liver/kidney failure. Pt is on dialysis and received call today and told his platelets were 38 and ammonia was 123 and told him to come here after dialysis. Axox3, disoriented to year.

## 2017-12-25 NOTE — ED Provider Notes (Signed)
[facility] EMERGENCY DEPARTMENT Provider Note   CSN: [id number] Arrival date & time: 12/25/17  1638     History   Chief Complaint Chief Complaint  Patient presents with  . Abnormal Lab    HPI [name] is a 57 y.o. male.  HPI  Patient presents with his wife who assist with the HPI. Patient has cirrhosis, is on dialysis, had a dialysis session today, and now presents with concern of worsening pain, weakness. In addition, patient had outpatient labs performed, and findings were concerning for thrombocytopenia and hyperammonemia.  Himself denies fever, vomiting, diarrhea, confusion. He does acknowledge ongoing weakness, and substantial pain, diffusely without changes in the characteristics, but with worsening severity due to running out of his previously prescribed narcotics, and fentanyl patch. Patient was previously receiving his medication from the [place],  but his physician there has declined to provide additional medication and he has not yet spoken with a pain management practitioner. Patient and his wife note that they are enrolling in hospice care, and they note that the patient's primary care physician has advised them that he has a very short time frame of living.  Past Medical History:  Diagnosis Date  . Chronic lower back pain   . Compression fracture    lumbar 3  . Diabetes mellitus   . Fracture acetabulum-closed (HCC) 04/09/2013  . GERD (gastroesophageal reflux disease)   . H/O Legionnaire's disease   . Hepatomegaly   . Migraine   . Osteoporosis   . Pancreatitis   . PTSD (post-traumatic stress disorder)   . Vitamin D deficiency     Patient Active Problem List   Diagnosis Date Noted  . Pain, generalized   . Thrombocytopenia (HCC) 11/25/2017  . Alcohol abuse 11/25/2017  . ESRD (end stage renal disease) on dialysis (HCC)   . Goals of care, counseling/discussion   . Palliative care by specialist   . Acute renal failure (ARF) (HCC)     . Cirrhosis (HCC) 11/07/2017  . Influenza A 10/16/2017  . Closed fracture of second lumbar vertebra (HCC)   . Compression fracture of L3 lumbar vertebra with delayed healing 07/16/2015  . Compression fracture of lumbar vertebra (HCC) 07/16/2015  . Pressure ulcer 01/18/2015  . Acute respiratory failure with hypoxia (HCC)   . Hypoxia   . Lumbar foraminal stenosis 04/19/2013  . Acetabulum fracture, right (HCC) 04/09/2013  . Type 2 diabetes mellitus (HCC) 04/09/2013  . CIRRHOSIS, ALCOHOLIC 06/12/2010  . LIVER FUNCTION TESTS, ABNORMAL, HX OF 10/02/2009  . PANCREATITIS, HX OF 09/27/2009  . HYPOGONADISM 08/29/2009  . VITAMIN D DEFICIENCY 08/29/2009  . OSTEOPOROSIS 02/22/2009  . Hypertension associated with diabetes (HCC) 02/08/2009  . GERD 02/08/2009  . BACK PAIN, LUMBAR, CHRONIC 02/08/2009  . LEG EDEMA, LEFT 02/08/2009  . Hepatomegaly 02/08/2009    Past Surgical History:  Procedure Laterality Date  . AV FISTULA PLACEMENT Left 12/10/2017   Procedure: INSERTION OF ARTERIOVENOUS (AV) GORE-TEX GRAFT LEFT  ARM;  Surgeon: Chuck Hint, MD;  Location: [facility] OR;  Service: Vascular;  Laterality: Left;  . BACK SURGERY    . CHOLECYSTECTOMY    . HERNIA REPAIR    . IR FLUORO GUIDE CV LINE RIGHT  11/27/2017  . IR US GUIDE VASC ACCESS RIGHT  11/27/2017  . KYPHOPLASTY N/A 07/16/2015   Procedure: Lumbar three kyphoplasty;  Surgeon: Coletta Memos, MD;  Location: [facility];  Service: Neurosurgery;  Laterality: N/A;  Lumbar three kyphoplasty  . LAMINECTOMY    .  MASS EXCISION Left 06/12/2017   Procedure: EXCISION LEFT AXILLARY SEBACEOUS CYST;  Surgeon: Abigail Miyamoto, MD;  Location: [facility];  Service: General;  Laterality: Left;  . STERNUM FRACTURE SURGERY          Home Medications    Prior to Admission medications   Medication Sig Start Date End Date Taking? Authorizing Provider  albuterol (PROVENTIL HFA;VENTOLIN HFA) 108 (90 Base) MCG/ACT inhaler Inhale 2 puffs into  the lungs every 6 (six) hours as needed for wheezing or shortness of breath. 10/16/17  Yes Remus Loffler, PA-C  fentaNYL (DURAGESIC - DOSED MCG/HR) 12 MCG/HR Place 1 patch (12.5 mcg total) onto the skin every 3 (three) days. 12/13/17  Yes Elgergawy, Leana Roe, MD  gabapentin (NEURONTIN) 100 MG capsule Take 2 capsules (200 mg total) by mouth daily. Patient taking differently: Take 100-200 mg by mouth See admin instructions. Tale 200 mg in the morning and 100 mg in the evening 12/14/17  Yes Elgergawy, Leana Roe, MD  guaiFENesin (MUCINEX) 600 MG 12 hr tablet Take 2 tablets (1,200 mg total) by mouth 2 (two) times daily. Patient taking differently: Take 1,200 mg by mouth 2 (two) times daily as needed.  10/18/17  Yes Narda Bonds, MD  hydrochlorothiazide (HYDRODIURIL) 25 MG tablet Take 0.5 tablets (12.5 mg total) by mouth 2 (two) times daily. 10/22/17  Yes Dettinger, Elige Radon, MD  insulin aspart (NOVOLOG FLEXPEN) 100 UNIT/ML FlexPen INJECT 7 TO 15 UNITS subcu up to 4 times daily Patient taking differently: INJECT 9 - 17 UNITS SQ UP TO FOUR TIMES A DAY 10/23/17  Yes Dettinger, Elige Radon, MD  Insulin Glargine (LANTUS SOLOSTAR) 100 UNIT/ML Solostar Pen Inject 26 Units into the skin daily. Patient taking differently: Inject 28 Units into the skin daily.  12/13/17  Yes Elgergawy, Leana Roe, MD  milk thistle 175 MG tablet Take 175 mg by mouth 2 (two) times daily.   Yes [provider]  oxyCODONE (OXY IR/ROXICODONE) 5 MG immediate release tablet Take 1 tablet (5 mg total) by mouth every 4 (four) hours as needed for severe pain (dyspnea). 12/13/17  Yes Elgergawy, Leana Roe, MD  pantoprazole (PROTONIX) 40 MG tablet Take 1 tablet (40 mg total) by mouth daily. 10/22/17  Yes Dettinger, Elige Radon, MD  Calcium Carbonate Antacid (CALCIUM CARBONATE, DOSED IN MG ELEMENTAL CALCIUM,) 1250 MG/5ML SUSP Take 5 mLs (500 mg of elemental calcium total) by mouth every 6 (six) hours as needed for indigestion. Patient not taking:  Reported on 12/25/2017 12/13/17   Elgergawy, Leana Roe, MD  gabapentin (NEURONTIN) 300 MG capsule Take 1 capsule (300 mg total) by mouth every Monday, Wednesday, and Friday. Please take after hemodialysis Patient not taking: Reported on 12/25/2017 12/14/17   Elgergawy, Leana Roe, MD  lactulose (CHRONULAC) 10 GM/15ML solution Take 15 mLs (10 g total) by mouth 2 (two) times daily. Patient not taking: Reported on 12/24/2017 12/13/17   Elgergawy, Leana Roe, MD  midodrine (PROAMATINE) 10 MG tablet Take 1 tablet (10 mg total) by mouth 3 (three) times daily with meals. Patient taking differently: Take 15 mg by mouth 3 (three) times daily with meals.  12/13/17   Elgergawy, Leana Roe, MD  rifaximin (XIFAXAN) 550 MG TABS tablet Take 1 tablet (550 mg total) by mouth 2 (two) times daily. 12/13/17   Elgergawy, Leana Roe, MD    Family History Family History  Problem Relation Age of Onset  . Cancer Unknown        breast/grandmother, prostate/grandfather  Social History Social History   Tobacco Use  . Smoking status: Former Smoker    Packs/day: 0.50    Years: 26.00    Pack years: 13.00    Types: Cigarettes    Last attempt to quit: 08/26/2007    Years since quitting: 10.3  . Smokeless tobacco: Never Used  Substance Use Topics  . Alcohol use: Yes    Alcohol/week: 3.6 oz    Types: 6 Glasses of wine per week    Comment: 6 oz x 6 glass of wine daily   . Drug use: No     Allergies   Ketorolac tromethamine and Temsirolimus   Review of Systems Review of Systems  Constitutional:       Per HPI, otherwise negative  HENT:       Per HPI, otherwise negative  Respiratory:       Per HPI, otherwise negative  Cardiovascular:       Per HPI, otherwise negative  Gastrointestinal: Positive for nausea. Negative for vomiting.  Endocrine:       Negative aside from HPI  Genitourinary:       ESRD  Musculoskeletal:       Per HPI, otherwise negative  Skin: Positive for color change.  Allergic/Immunologic: Positive  for immunocompromised state.  Neurological: Positive for weakness. Negative for syncope.  Hematological: Bruises/bleeds easily.     Physical Exam Updated Vital Signs BP 117/71   Pulse 81   Temp 98 F (36.7 C) (Oral)   Resp 18   Ht  (1.727 m)   Wt 74.8 kg (165 lb)   SpO2 94%   BMI 25.09 kg/m   Physical Exam  Constitutional: He is oriented to person, place, and time. No distress.  Very sickly, jaundiced appearing male awake and alert, answers questions appropriately.  HENT:  Head: Normocephalic and atraumatic.  Eyes: Conjunctivae and EOM are normal. Scleral icterus is present.  Cardiovascular: Normal rate and regular rhythm.  Pulmonary/Chest:  Diminished breath sounds throughout  Abdominal: He exhibits no distension.  Protuberant abdomen, minimal discomfort with palpation  Musculoskeletal: He exhibits edema.  Neurological: He is alert and oriented to person, place, and time.  Skin: Skin is warm and dry.  Jaundice Right upper chest wall tunneled dialysis catheter unremarkable  Psychiatric: He has a normal mood and affect.  Nursing note and vitals reviewed.    ED Treatments / Results  Labs (all labs ordered are listed, but only abnormal results are displayed) Labs Reviewed  COMPREHENSIVE METABOLIC PANEL - Abnormal; Notable for the following components:      Result Value   Sodium 134 (*)    Potassium 2.9 (*)    Chloride 95 (*)    Glucose, Bld 172 (*)    Creatinine, Ser 2.26 (*)    Calcium 8.1 (*)    Albumin 2.9 (*)    AST 42 (*)    Alkaline Phosphatase 255 (*)    Total Bilirubin 4.7 (*)    GFR calc non Af Amer 31 (*)    GFR calc Af Amer 36 (*)    All other components within normal limits  BRAIN NATRIURETIC PEPTIDE - Abnormal; Notable for the following components:   B Natriuretic Peptide 162.2 (*)    All other components within normal limits  CBC WITH DIFFERENTIAL/PLATELET - Abnormal; Notable for the following components:   RBC 3.35 (*)    Hemoglobin  10.4 (*)    HCT 34.0 (*)    MCV 101.5 (*)  Platelets 33 (*)    All other components within normal limits  AMMONIA - Abnormal; Notable for the following components:   Ammonia 77 (*)    All other components within normal limits  LIPASE, BLOOD  ETHANOL  I-STAT CG4 LACTIC ACID, ED    EKG None  Radiology No results found.  Procedures Procedures (including critical care time)  Medications Ordered in ED Medications  HYDROmorphone (DILAUDID) injection 1 mg (1 mg Intravenous Given 12/25/17 1929)     Initial Impression / Assessment and Plan / ED Course  I have reviewed the triage vital signs and the nursing notes.  Pertinent labs & imaging results that were available during my care of the patient were reviewed by me and considered in my medical decision making (see chart for details).   After the initial evaluation I reviewed the patient's chart including discharge summary from last month with notation of hepatic and renal failure, initiation of dialysis.   10:09 PM On repeat exam the patient is sitting upright, drinking juice. We reviewed all findings including ammonia level half of outpatient value, platelets consistent in the 30s. Patient's labs otherwise similar/improving from recent studies, with no drop in hemoglobin, no evidence for exsanguination. Patient remains hemodynamically appropriate, with no substantial tachycardia nor hypotension suggesting ongoing bleed. After thorough review, and discussion of the patient's findings, and his history, and confirming that the patient is enrolling in hospice care in the coming days, patient was discharged in stable condition with ongoing analgesia, to follow-up as an outpatient.  Final Clinical Impressions(s) / ED Diagnoses  Pain Thrombocytopenia   Gerhard Munch, MD 12/25/17 2210

## 2017-12-25 NOTE — ED Notes (Signed)
Pt requesting apple juice, cranberry juice and graham crackers. OK per Arlys John pt given the same.

## 2017-12-25 NOTE — ED Notes (Signed)
Pts spouses name is Khalin Royceson and wants to be notified of any changes 

## 2017-12-25 NOTE — ED Notes (Signed)
Patient Alert and oriented to baseline. Stable and ambulatory to baseline. Patient verbalized understanding of the discharge instructions.  Patient belongings were taken by the patient.   

## 2017-12-25 NOTE — ED Notes (Signed)
ED physician at bedside.

## 2017-12-28 ENCOUNTER — Telehealth: Payer: Self-pay

## 2017-12-28 DIAGNOSIS — N186 End stage renal disease: Secondary | ICD-10-CM | POA: Diagnosis not present

## 2017-12-28 DIAGNOSIS — E8770 Fluid overload, unspecified: Secondary | ICD-10-CM | POA: Diagnosis not present

## 2017-12-28 NOTE — Telephone Encounter (Incomplete)
Palliative Medicine RN Note: Rec'd a call from [name]'s wife [name] asking about home hospice/palliative services at home, as well as symptom management help and questions about treatment.  1. She reports that they have been told he has a life expectancy of 12 weeks even on HD and is frustrated that he can't have hospice. She and patient both are unready to stop HD, so hospice will not be able to admit them. I explained HD as an aggressive life-prolonging measure, and she verbalized understanding.  2. Pain control: She reports that the [place] is not prescribing pain meds as they had agreed before d[facility]. Her narrative is confusing, but [facility] I suggested she ask any of his other doctors for help until then.  3. Information on transplant: she asked about his sister donating part of her liver to him. I explained that our team does not have experience with that, and that she would get the most information from his GI doctor. She reports that he doesn't have a GI MD; I recommended she call his primary to discuss these questions/concerns.  4. [name] also reports recent ED visit after most recent HD attempt. He is

## 2017-12-29 DIAGNOSIS — F1010 Alcohol abuse, uncomplicated: Secondary | ICD-10-CM | POA: Diagnosis not present

## 2017-12-29 DIAGNOSIS — N186 End stage renal disease: Secondary | ICD-10-CM | POA: Diagnosis not present

## 2017-12-29 DIAGNOSIS — M545 Low back pain: Secondary | ICD-10-CM | POA: Diagnosis not present

## 2017-12-29 DIAGNOSIS — K7030 Alcoholic cirrhosis of liver without ascites: Secondary | ICD-10-CM | POA: Diagnosis not present

## 2017-12-29 DIAGNOSIS — K219 Gastro-esophageal reflux disease without esophagitis: Secondary | ICD-10-CM | POA: Diagnosis not present

## 2017-12-29 DIAGNOSIS — E1165 Type 2 diabetes mellitus with hyperglycemia: Secondary | ICD-10-CM | POA: Diagnosis not present

## 2017-12-29 DIAGNOSIS — S81802D Unspecified open wound, left lower leg, subsequent encounter: Secondary | ICD-10-CM | POA: Diagnosis not present

## 2017-12-29 DIAGNOSIS — E1122 Type 2 diabetes mellitus with diabetic chronic kidney disease: Secondary | ICD-10-CM | POA: Diagnosis not present

## 2017-12-29 DIAGNOSIS — I2510 Atherosclerotic heart disease of native coronary artery without angina pectoris: Secondary | ICD-10-CM | POA: Diagnosis not present

## 2017-12-30 DIAGNOSIS — E8770 Fluid overload, unspecified: Secondary | ICD-10-CM | POA: Diagnosis not present

## 2017-12-30 DIAGNOSIS — N186 End stage renal disease: Secondary | ICD-10-CM | POA: Diagnosis not present

## 2017-12-31 ENCOUNTER — Encounter: Payer: Medicare HMO | Admitting: Vascular Surgery

## 2018-01-04 DIAGNOSIS — E8770 Fluid overload, unspecified: Secondary | ICD-10-CM | POA: Diagnosis not present

## 2018-01-04 DIAGNOSIS — N186 End stage renal disease: Secondary | ICD-10-CM | POA: Diagnosis not present

## 2018-01-05 DIAGNOSIS — F1010 Alcohol abuse, uncomplicated: Secondary | ICD-10-CM | POA: Diagnosis not present

## 2018-01-05 DIAGNOSIS — K219 Gastro-esophageal reflux disease without esophagitis: Secondary | ICD-10-CM | POA: Diagnosis not present

## 2018-01-05 DIAGNOSIS — E1165 Type 2 diabetes mellitus with hyperglycemia: Secondary | ICD-10-CM | POA: Diagnosis not present

## 2018-01-05 DIAGNOSIS — E1122 Type 2 diabetes mellitus with diabetic chronic kidney disease: Secondary | ICD-10-CM | POA: Diagnosis not present

## 2018-01-05 DIAGNOSIS — N186 End stage renal disease: Secondary | ICD-10-CM | POA: Diagnosis not present

## 2018-01-05 DIAGNOSIS — S81802D Unspecified open wound, left lower leg, subsequent encounter: Secondary | ICD-10-CM | POA: Diagnosis not present

## 2018-01-05 DIAGNOSIS — J9601 Acute respiratory failure with hypoxia: Secondary | ICD-10-CM | POA: Diagnosis not present

## 2018-01-05 DIAGNOSIS — K7030 Alcoholic cirrhosis of liver without ascites: Secondary | ICD-10-CM | POA: Diagnosis not present

## 2018-01-05 DIAGNOSIS — I2510 Atherosclerotic heart disease of native coronary artery without angina pectoris: Secondary | ICD-10-CM | POA: Diagnosis not present

## 2018-01-05 DIAGNOSIS — M545 Low back pain: Secondary | ICD-10-CM | POA: Diagnosis not present

## 2018-01-06 DIAGNOSIS — E8770 Fluid overload, unspecified: Secondary | ICD-10-CM | POA: Diagnosis not present

## 2018-01-06 DIAGNOSIS — N186 End stage renal disease: Secondary | ICD-10-CM | POA: Diagnosis not present

## 2018-01-08 DIAGNOSIS — E8770 Fluid overload, unspecified: Secondary | ICD-10-CM | POA: Diagnosis not present

## 2018-01-08 DIAGNOSIS — N186 End stage renal disease: Secondary | ICD-10-CM | POA: Diagnosis not present

## 2018-01-11 DIAGNOSIS — N186 End stage renal disease: Secondary | ICD-10-CM | POA: Diagnosis not present

## 2018-01-11 DIAGNOSIS — E8770 Fluid overload, unspecified: Secondary | ICD-10-CM | POA: Diagnosis not present

## 2018-01-12 ENCOUNTER — Ambulatory Visit (INDEPENDENT_AMBULATORY_CARE_PROVIDER_SITE_OTHER): Payer: Self-pay | Admitting: Physician Assistant

## 2018-01-12 ENCOUNTER — Encounter: Payer: Self-pay | Admitting: Physician Assistant

## 2018-01-12 VITALS — BP 120/77 | HR 95 | Temp 97.0°F | Resp 20 | Ht 68.0 in | Wt 166.9 lb

## 2018-01-12 DIAGNOSIS — S81802D Unspecified open wound, left lower leg, subsequent encounter: Secondary | ICD-10-CM | POA: Diagnosis not present

## 2018-01-12 DIAGNOSIS — E1165 Type 2 diabetes mellitus with hyperglycemia: Secondary | ICD-10-CM | POA: Diagnosis not present

## 2018-01-12 DIAGNOSIS — N186 End stage renal disease: Secondary | ICD-10-CM | POA: Diagnosis not present

## 2018-01-12 DIAGNOSIS — E1122 Type 2 diabetes mellitus with diabetic chronic kidney disease: Secondary | ICD-10-CM | POA: Diagnosis not present

## 2018-01-12 DIAGNOSIS — K7030 Alcoholic cirrhosis of liver without ascites: Secondary | ICD-10-CM | POA: Diagnosis not present

## 2018-01-12 DIAGNOSIS — F1010 Alcohol abuse, uncomplicated: Secondary | ICD-10-CM | POA: Diagnosis not present

## 2018-01-12 DIAGNOSIS — I2510 Atherosclerotic heart disease of native coronary artery without angina pectoris: Secondary | ICD-10-CM | POA: Diagnosis not present

## 2018-01-12 DIAGNOSIS — M545 Low back pain: Secondary | ICD-10-CM | POA: Diagnosis not present

## 2018-01-12 DIAGNOSIS — Z992 Dependence on renal dialysis: Secondary | ICD-10-CM

## 2018-01-12 DIAGNOSIS — K219 Gastro-esophageal reflux disease without esophagitis: Secondary | ICD-10-CM | POA: Diagnosis not present

## 2018-01-12 NOTE — Progress Notes (Signed)
POST OPERATIVE OFFICE NOTE    CC:  F/u for surgery  HPI:  This is a 57 y.o. male who is s/p New left forearm AV graft by Dr. Edilia Bo 12/10/17.  The pt was doing well on POD 1 with the graft having a good thrill and bruit.   Pt had a tunneled dialysis catheter placed by IR on 11/27/17.  She is on dialysis M/W/F at the center on [address]  He states that he has done well.  He denies any pain in his hand.  He states he has been wearing a band-aid over the lower incision as it was getting caught on his clothes (spitting stitch).  He states they used his graft yesterday with one needle and it went well.     Allergies  Allergen Reactions  . Ketorolac Tromethamine Hives  . Temsirolimus Other (See Comments)    Unknown - Pt does not recognize this medication [facility]    Current Outpatient Medications  Medication Sig Dispense Refill  . albuterol (PROVENTIL HFA;VENTOLIN HFA) 108 (90 Base) MCG/ACT inhaler Inhale 2 puffs into the lungs every 6 (six) hours as needed for wheezing or shortness of breath. 1 Inhaler 6  . Calcium Carbonate Antacid (CALCIUM CARBONATE, DOSED IN MG ELEMENTAL CALCIUM,) 1250 MG/5ML SUSP Take 5 mLs (500 mg of elemental calcium total) by mouth every 6 (six) hours as needed for indigestion. (Patient not taking: Reported on 12/25/2017) 450 mL 0  . fentaNYL (DURAGESIC - DOSED MCG/HR) 12 MCG/HR Place 1 patch (12.5 mcg total) onto the skin every 3 (three) days. 5 patch 0  . gabapentin (NEURONTIN) 100 MG capsule Take 2 capsules (200 mg total) by mouth daily. (Patient taking differently: Take 100-200 mg by mouth See admin instructions. Tale 200 mg in the morning and 100 mg in the evening) 30 capsule 0  . gabapentin (NEURONTIN) 300 MG capsule Take 1 capsule (300 mg total) by mouth every Monday, Wednesday, and Friday. Please take after hemodialysis (Patient not taking: Reported on 12/25/2017) 30 capsule 0  . guaiFENesin (MUCINEX) 600 MG 12 hr tablet Take 2 tablets (1,200 mg  total) by mouth 2 (two) times daily. (Patient taking differently: Take 1,200 mg by mouth 2 (two) times daily as needed. ) 30 tablet 0  . hydrochlorothiazide (HYDRODIURIL) 25 MG tablet Take 0.5 tablets (12.5 mg total) by mouth 2 (two) times daily. 180 tablet 1  . insulin aspart (NOVOLOG FLEXPEN) 100 UNIT/ML FlexPen INJECT 7 TO 15 UNITS subcu up to 4 times daily (Patient taking differently: INJECT 9 - 17 UNITS SQ UP TO FOUR TIMES A DAY) 45 mL 0  . Insulin Glargine (LANTUS SOLOSTAR) 100 UNIT/ML Solostar Pen Inject 26 Units into the skin daily. (Patient taking differently: Inject 28 Units into the skin daily. ) 16 mL 1  . lactulose (CHRONULAC) 10 GM/15ML solution Take 15 mLs (10 g total) by mouth 2 (two) times daily. (Patient not taking: Reported on 12/24/2017) 240 mL 0  . midodrine (PROAMATINE) 10 MG tablet Take 1 tablet (10 mg total) by mouth 3 (three) times daily with meals. (Patient taking differently: Take 15 mg by mouth 3 (three) times daily with meals. ) 90 tablet 0  . milk thistle 175 MG tablet Take 175 mg by mouth 2 (two) times daily.    Marland Kitchen oxyCODONE (OXY IR/ROXICODONE) 5 MG immediate release tablet Take 1 tablet (5 mg total) by mouth every 4 (four) hours as needed for severe pain (dyspnea). 20 tablet 0  . pantoprazole (  PROTONIX) 40 MG tablet Take 1 tablet (40 mg total) by mouth daily. 90 tablet 3  . rifaximin (XIFAXAN) 550 MG TABS tablet Take 1 tablet (550 mg total) by mouth 2 (two) times daily. 42 tablet 0   No current facility-administered medications for this visit.      ROS:  See HPI  Physical Exam: Vitals:   01/12/18 1335  BP: 120/77  Pulse: 95  Resp: 20  Temp: (!) 97 F (36.1 C)  SpO2: 93%   Vitals:   01/12/18 1335  Weight: 166 lb 14.4 oz (75.7 kg)  Height:  (1.727 m)   Body mass index is 25.38 kg/m.   Incision:  Antecubital incision is clean and dry; distal incision is clean and dry with spitting stitch present.  There is some redness around the incision  (shape of  band-aid). Extremities:  +palpable left radial pulse; +thrill/bruit within the graft; good grip bilaterally    Assessment/Plan:  This is a 57 y.o. male who is s/p: New left forearm AV graft by Dr. Edilia Bo 12/10/17  -pt's graft has a good thrill and bruit within the graft.  It was used yesterday with one needle and was successful.  May begin using the graft.  Once it has been used successfully 2-3 times, the catheter, which was placed by IR on 11/27/17 can be removed.  There is no evidence of steal sx.  His incisions have healed nicely-there was a spitting stitch at the lateral edge of the distal incision which I removed.  The distal incision also had some redness around it, but this was in the form of the band-aid and feel it was a reaction to this.  Does not appear infected.   Doreatha Massed, PA-C [facility] [phone]  Clinic MD:  Early

## 2018-01-13 DIAGNOSIS — E8770 Fluid overload, unspecified: Secondary | ICD-10-CM | POA: Diagnosis not present

## 2018-01-13 DIAGNOSIS — N186 End stage renal disease: Secondary | ICD-10-CM | POA: Diagnosis not present

## 2018-01-15 DIAGNOSIS — E8770 Fluid overload, unspecified: Secondary | ICD-10-CM | POA: Diagnosis not present

## 2018-01-15 DIAGNOSIS — N186 End stage renal disease: Secondary | ICD-10-CM | POA: Diagnosis not present

## 2018-01-18 DIAGNOSIS — N186 End stage renal disease: Secondary | ICD-10-CM | POA: Diagnosis not present

## 2018-01-18 DIAGNOSIS — E8770 Fluid overload, unspecified: Secondary | ICD-10-CM | POA: Diagnosis not present

## 2018-01-19 DIAGNOSIS — E1165 Type 2 diabetes mellitus with hyperglycemia: Secondary | ICD-10-CM | POA: Diagnosis not present

## 2018-01-19 DIAGNOSIS — F1010 Alcohol abuse, uncomplicated: Secondary | ICD-10-CM | POA: Diagnosis not present

## 2018-01-19 DIAGNOSIS — S81802D Unspecified open wound, left lower leg, subsequent encounter: Secondary | ICD-10-CM | POA: Diagnosis not present

## 2018-01-19 DIAGNOSIS — N186 End stage renal disease: Secondary | ICD-10-CM | POA: Diagnosis not present

## 2018-01-19 DIAGNOSIS — K219 Gastro-esophageal reflux disease without esophagitis: Secondary | ICD-10-CM | POA: Diagnosis not present

## 2018-01-19 DIAGNOSIS — K7030 Alcoholic cirrhosis of liver without ascites: Secondary | ICD-10-CM | POA: Diagnosis not present

## 2018-01-19 DIAGNOSIS — I2510 Atherosclerotic heart disease of native coronary artery without angina pectoris: Secondary | ICD-10-CM | POA: Diagnosis not present

## 2018-01-19 DIAGNOSIS — M545 Low back pain: Secondary | ICD-10-CM | POA: Diagnosis not present

## 2018-01-19 DIAGNOSIS — E1122 Type 2 diabetes mellitus with diabetic chronic kidney disease: Secondary | ICD-10-CM | POA: Diagnosis not present

## 2018-01-20 DIAGNOSIS — K7030 Alcoholic cirrhosis of liver without ascites: Secondary | ICD-10-CM | POA: Diagnosis not present

## 2018-01-20 DIAGNOSIS — E8770 Fluid overload, unspecified: Secondary | ICD-10-CM | POA: Diagnosis not present

## 2018-01-20 DIAGNOSIS — N186 End stage renal disease: Secondary | ICD-10-CM | POA: Diagnosis not present

## 2018-01-20 DIAGNOSIS — J9601 Acute respiratory failure with hypoxia: Secondary | ICD-10-CM | POA: Diagnosis not present

## 2018-01-22 DIAGNOSIS — N186 End stage renal disease: Secondary | ICD-10-CM | POA: Diagnosis not present

## 2018-01-22 DIAGNOSIS — N170 Acute kidney failure with tubular necrosis: Secondary | ICD-10-CM | POA: Diagnosis not present

## 2018-01-22 DIAGNOSIS — Z992 Dependence on renal dialysis: Secondary | ICD-10-CM | POA: Diagnosis not present

## 2018-01-23 DIAGNOSIS — N186 End stage renal disease: Secondary | ICD-10-CM | POA: Diagnosis not present

## 2018-01-25 DIAGNOSIS — N186 End stage renal disease: Secondary | ICD-10-CM | POA: Diagnosis not present

## 2018-01-27 DIAGNOSIS — N186 End stage renal disease: Secondary | ICD-10-CM | POA: Diagnosis not present

## 2018-01-29 DIAGNOSIS — N186 End stage renal disease: Secondary | ICD-10-CM | POA: Diagnosis not present

## 2018-02-01 DIAGNOSIS — N186 End stage renal disease: Secondary | ICD-10-CM | POA: Diagnosis not present

## 2018-02-02 DIAGNOSIS — F1010 Alcohol abuse, uncomplicated: Secondary | ICD-10-CM | POA: Diagnosis not present

## 2018-02-02 DIAGNOSIS — K219 Gastro-esophageal reflux disease without esophagitis: Secondary | ICD-10-CM | POA: Diagnosis not present

## 2018-02-02 DIAGNOSIS — K7030 Alcoholic cirrhosis of liver without ascites: Secondary | ICD-10-CM | POA: Diagnosis not present

## 2018-02-02 DIAGNOSIS — N186 End stage renal disease: Secondary | ICD-10-CM | POA: Diagnosis not present

## 2018-02-02 DIAGNOSIS — I2510 Atherosclerotic heart disease of native coronary artery without angina pectoris: Secondary | ICD-10-CM | POA: Diagnosis not present

## 2018-02-02 DIAGNOSIS — M545 Low back pain: Secondary | ICD-10-CM | POA: Diagnosis not present

## 2018-02-02 DIAGNOSIS — S81802D Unspecified open wound, left lower leg, subsequent encounter: Secondary | ICD-10-CM | POA: Diagnosis not present

## 2018-02-02 DIAGNOSIS — E1122 Type 2 diabetes mellitus with diabetic chronic kidney disease: Secondary | ICD-10-CM | POA: Diagnosis not present

## 2018-02-02 DIAGNOSIS — E1165 Type 2 diabetes mellitus with hyperglycemia: Secondary | ICD-10-CM | POA: Diagnosis not present

## 2018-02-05 DIAGNOSIS — N186 End stage renal disease: Secondary | ICD-10-CM | POA: Diagnosis not present

## 2018-02-08 ENCOUNTER — Telehealth: Payer: Self-pay | Admitting: *Deleted

## 2018-02-08 DIAGNOSIS — N186 End stage renal disease: Secondary | ICD-10-CM | POA: Diagnosis not present

## 2018-02-08 NOTE — Telephone Encounter (Signed)
Pt has skin tear RL shin would like order for Vaseline gause with ABD pad and curlex as well as recertification

## 2018-02-08 NOTE — Telephone Encounter (Signed)
Go ahead and order these for him and go ahead and do recertification for I am assuming she means home wound care

## 2018-02-09 MED ORDER — VASELINE PETROLATUM GAUZE EX PADS: MEDICATED_PAD | CUTANEOUS | 1 refills | Status: DC

## 2018-02-09 MED ORDER — "ABDOMINAL PAD 8""X10"" PADS": 1.0000 | MEDICATED_PAD | 0 refills | Status: DC | PRN

## 2018-02-09 MED ORDER — KERLIX BANDAGE ROLL MISC: 1 refills | Status: DC

## 2018-02-09 NOTE — Telephone Encounter (Signed)
Verbal given for order of vaseline gauze, ABD pad, Kerlix and [facility]

## 2018-02-10 DIAGNOSIS — N186 End stage renal disease: Secondary | ICD-10-CM | POA: Diagnosis not present

## 2018-02-11 ENCOUNTER — Inpatient Hospital Stay (HOSPITAL_COMMUNITY)
Admission: EM | Admit: 2018-02-11 | Discharge: 2018-02-12 | DRG: 640 | Disposition: A | Discharge status: Home / Self Care | Payer: Medicare Other | Attending: Internal Medicine | Admitting: Internal Medicine

## 2018-02-11 ENCOUNTER — Encounter (HOSPITAL_COMMUNITY): Payer: Self-pay | Admitting: Emergency Medicine

## 2018-02-11 ENCOUNTER — Emergency Department (HOSPITAL_COMMUNITY): Payer: Medicare Other

## 2018-02-11 ENCOUNTER — Other Ambulatory Visit: Payer: Self-pay

## 2018-02-11 DIAGNOSIS — T8131XA Disruption of external operation (surgical) wound, not elsewhere classified, initial encounter: Secondary | ICD-10-CM | POA: Diagnosis not present

## 2018-02-11 DIAGNOSIS — E559 Vitamin D deficiency, unspecified: Secondary | ICD-10-CM | POA: Diagnosis not present

## 2018-02-11 DIAGNOSIS — N186 End stage renal disease: Secondary | ICD-10-CM | POA: Diagnosis not present

## 2018-02-11 DIAGNOSIS — M810 Age-related osteoporosis without current pathological fracture: Secondary | ICD-10-CM | POA: Diagnosis present

## 2018-02-11 DIAGNOSIS — T8130XD Disruption of wound, unspecified, subsequent encounter: Secondary | ICD-10-CM

## 2018-02-11 DIAGNOSIS — M545 Low back pain: Secondary | ICD-10-CM | POA: Diagnosis not present

## 2018-02-11 DIAGNOSIS — F1010 Alcohol abuse, uncomplicated: Secondary | ICD-10-CM | POA: Diagnosis not present

## 2018-02-11 DIAGNOSIS — E1122 Type 2 diabetes mellitus with diabetic chronic kidney disease: Secondary | ICD-10-CM | POA: Diagnosis present

## 2018-02-11 DIAGNOSIS — N2581 Secondary hyperparathyroidism of renal origin: Secondary | ICD-10-CM | POA: Diagnosis not present

## 2018-02-11 DIAGNOSIS — E8770 Fluid overload, unspecified: Secondary | ICD-10-CM | POA: Diagnosis not present

## 2018-02-11 DIAGNOSIS — R0602 Shortness of breath: Secondary | ICD-10-CM | POA: Diagnosis not present

## 2018-02-11 DIAGNOSIS — D696 Thrombocytopenia, unspecified: Secondary | ICD-10-CM | POA: Diagnosis present

## 2018-02-11 DIAGNOSIS — Z794 Long term (current) use of insulin: Secondary | ICD-10-CM

## 2018-02-11 DIAGNOSIS — I120 Hypertensive chronic kidney disease with stage 5 chronic kidney disease or end stage renal disease: Secondary | ICD-10-CM | POA: Diagnosis present

## 2018-02-11 DIAGNOSIS — R188 Other ascites: Secondary | ICD-10-CM

## 2018-02-11 DIAGNOSIS — F4310 Post-traumatic stress disorder, unspecified: Secondary | ICD-10-CM | POA: Diagnosis present

## 2018-02-11 DIAGNOSIS — Z79899 Other long term (current) drug therapy: Secondary | ICD-10-CM

## 2018-02-11 DIAGNOSIS — Z9049 Acquired absence of other specified parts of digestive tract: Secondary | ICD-10-CM

## 2018-02-11 DIAGNOSIS — I953 Hypotension of hemodialysis: Secondary | ICD-10-CM | POA: Diagnosis not present

## 2018-02-11 DIAGNOSIS — K7460 Unspecified cirrhosis of liver: Secondary | ICD-10-CM | POA: Diagnosis present

## 2018-02-11 DIAGNOSIS — K7031 Alcoholic cirrhosis of liver with ascites: Secondary | ICD-10-CM | POA: Diagnosis present

## 2018-02-11 DIAGNOSIS — Z992 Dependence on renal dialysis: Secondary | ICD-10-CM

## 2018-02-11 DIAGNOSIS — R899 Unspecified abnormal finding in specimens from other organs, systems and tissues: Secondary | ICD-10-CM | POA: Diagnosis not present

## 2018-02-11 DIAGNOSIS — J189 Pneumonia, unspecified organism: Secondary | ICD-10-CM

## 2018-02-11 DIAGNOSIS — K767 Hepatorenal syndrome: Secondary | ICD-10-CM | POA: Diagnosis not present

## 2018-02-11 DIAGNOSIS — G8929 Other chronic pain: Secondary | ICD-10-CM | POA: Diagnosis present

## 2018-02-11 DIAGNOSIS — K219 Gastro-esophageal reflux disease without esophagitis: Secondary | ICD-10-CM | POA: Diagnosis not present

## 2018-02-11 DIAGNOSIS — Z87891 Personal history of nicotine dependence: Secondary | ICD-10-CM

## 2018-02-11 DIAGNOSIS — Z452 Encounter for adjustment and management of vascular access device: Secondary | ICD-10-CM | POA: Diagnosis not present

## 2018-02-11 DIAGNOSIS — J9601 Acute respiratory failure with hypoxia: Secondary | ICD-10-CM | POA: Diagnosis not present

## 2018-02-11 DIAGNOSIS — K7290 Hepatic failure, unspecified without coma: Secondary | ICD-10-CM

## 2018-02-11 DIAGNOSIS — Z888 Allergy status to other drugs, medicaments and biological substances status: Secondary | ICD-10-CM | POA: Diagnosis not present

## 2018-02-11 LAB — LACTATE DEHYDROGENASE, PLEURAL OR PERITONEAL FLUID: LD FL: 47 U/L — AB (ref 3–23)

## 2018-02-11 LAB — I-STAT CG4 LACTIC ACID, ED
LACTIC ACID, VENOUS: 1.7 mmol/L (ref 0.5–1.9)
Lactic Acid, Venous: 2.44 mmol/L (ref 0.5–1.9)

## 2018-02-11 LAB — GLUCOSE, PLEURAL OR PERITONEAL FLUID: Glucose, Fluid: 143 mg/dL

## 2018-02-11 LAB — BODY FLUID CELL COUNT WITH DIFFERENTIAL
Eos, Fluid: 0 %
Lymphs, Fluid: 81 %
Monocyte-Macrophage-Serous Fluid: 19 % — ABNORMAL LOW (ref 50–90)
NEUTROPHIL FLUID: 0 % (ref 0–25)
Other Cells, Fluid: 0 %
WBC FLUID: 67 uL (ref 0–1000)

## 2018-02-11 LAB — CBC WITH DIFFERENTIAL/PLATELET
ABS IMMATURE GRANULOCYTES: 0 10*3/uL (ref 0.0–0.1)
BASOS ABS: 0 10*3/uL (ref 0.0–0.1)
Basophils Relative: 0 %
Eosinophils Absolute: 0 10*3/uL (ref 0.0–0.7)
Eosinophils Relative: 0 %
HEMATOCRIT: 44.7 % (ref 39.0–52.0)
HEMOGLOBIN: 13.8 g/dL (ref 13.0–17.0)
IMMATURE GRANULOCYTES: 0 %
LYMPHS ABS: 1.3 10*3/uL (ref 0.7–4.0)
LYMPHS PCT: 17 %
MCH: 29.4 pg (ref 26.0–34.0)
MCHC: 30.9 g/dL (ref 30.0–36.0)
MCV: 95.1 fL (ref 78.0–100.0)
Monocytes Absolute: 1 10*3/uL (ref 0.1–1.0)
Monocytes Relative: 12 %
NEUTROS ABS: 5.4 10*3/uL (ref 1.7–7.7)
NEUTROS PCT: 71 %
Platelets: 70 10*3/uL — ABNORMAL LOW (ref 150–400)
RBC: 4.7 MIL/uL (ref 4.22–5.81)
RDW: 16.8 % — ABNORMAL HIGH (ref 11.5–15.5)
WBC: 7.8 10*3/uL (ref 4.0–10.5)

## 2018-02-11 LAB — COMPREHENSIVE METABOLIC PANEL
ALT: 17 U/L (ref 17–63)
ANION GAP: 13 (ref 5–15)
AST: 32 U/L (ref 15–41)
Albumin: 2.3 g/dL — ABNORMAL LOW (ref 3.5–5.0)
Alkaline Phosphatase: 238 U/L — ABNORMAL HIGH (ref 38–126)
BUN: 19 mg/dL (ref 6–20)
CHLORIDE: 97 mmol/L — AB (ref 101–111)
CO2: 26 mmol/L (ref 22–32)
Calcium: 8.6 mg/dL — ABNORMAL LOW (ref 8.9–10.3)
Creatinine, Ser: 5.31 mg/dL — ABNORMAL HIGH (ref 0.61–1.24)
GFR calc non Af Amer: 11 mL/min — ABNORMAL LOW (ref >60)
GFR, EST AFRICAN AMERICAN: 13 mL/min — AB (ref >60)
Glucose, Bld: 163 mg/dL — ABNORMAL HIGH (ref 65–99)
POTASSIUM: 3.1 mmol/L — AB (ref 3.5–5.1)
SODIUM: 136 mmol/L (ref 135–145)
Total Bilirubin: 2.1 mg/dL — ABNORMAL HIGH (ref 0.3–1.2)
Total Protein: 6.5 g/dL (ref 6.5–8.1)

## 2018-02-11 LAB — I-STAT TROPONIN, ED: Troponin i, poc: 0.06 ng/mL (ref 0.00–0.08)

## 2018-02-11 LAB — ALBUMIN, PLEURAL OR PERITONEAL FLUID: Albumin, Fluid: <1 g/dL

## 2018-02-11 LAB — PROTIME-INR
INR: 1.23
PROTHROMBIN TIME: 15.4 s — AB (ref 11.4–15.2)

## 2018-02-11 LAB — GRAM STAIN

## 2018-02-11 LAB — LACTATE DEHYDROGENASE: LDH: 190 U/L (ref 98–192)

## 2018-02-11 LAB — MRSA PCR SCREENING: MRSA by PCR: NEGATIVE

## 2018-02-11 LAB — PROTEIN, PLEURAL OR PERITONEAL FLUID: Total protein, fluid: <3 g/dL

## 2018-02-11 LAB — AMMONIA: AMMONIA: 100 umol/L — AB (ref 9–35)

## 2018-02-11 LAB — LIPASE, BLOOD: Lipase: 18 U/L (ref 11–51)

## 2018-02-11 MED ORDER — FENTANYL 25 MCG/HR TD PT72
25.0000 ug | MEDICATED_PATCH | TRANSDERMAL | Status: DC
  Administered 2018-02-11: 25 ug via TRANSDERMAL
  Filled 2018-02-11: qty 1

## 2018-02-11 MED ORDER — MORPHINE SULFATE (PF) 4 MG/ML IV SOLN
4.0000 mg | Freq: Once | INTRAVENOUS | Status: AC
  Administered 2018-02-11: 4 mg via INTRAVENOUS
  Filled 2018-02-11: qty 1

## 2018-02-11 MED ORDER — RIFAXIMIN 550 MG PO TABS
550.0000 mg | ORAL_TABLET | Freq: Two times a day (BID) | ORAL | Status: DC
  Administered 2018-02-11 – 2018-02-12 (×2): 550 mg via ORAL
  Filled 2018-02-11 (×3): qty 1

## 2018-02-11 MED ORDER — PRO-STAT SUGAR FREE PO LIQD
30.0000 mL | Freq: Two times a day (BID) | ORAL | Status: DC
  Administered 2018-02-12: 30 mL via ORAL
  Filled 2018-02-11: qty 30

## 2018-02-11 MED ORDER — CHLORHEXIDINE GLUCONATE CLOTH 2 % EX PADS: 6.0000 | MEDICATED_PAD | Freq: Every day | CUTANEOUS | Status: DC

## 2018-02-11 MED ORDER — ONDANSETRON HCL 4 MG/2ML IJ SOLN: 4.0000 mg | Freq: Four times a day (QID) | INTRAMUSCULAR | Status: DC | PRN

## 2018-02-11 MED ORDER — SODIUM CHLORIDE 0.9 % IV SOLN
500.0000 mg | Freq: Once | INTRAVENOUS | Status: AC
  Administered 2018-02-11: 500 mg via INTRAVENOUS
  Filled 2018-02-11: qty 500

## 2018-02-11 MED ORDER — MIDODRINE HCL 5 MG PO TABS: 10.0000 mg | ORAL_TABLET | Freq: Three times a day (TID) | ORAL | Status: DC

## 2018-02-11 MED ORDER — LIDOCAINE-EPINEPHRINE 1 %-1:100000 IJ SOLN
10.0000 mL | Freq: Once | INTRAMUSCULAR | Status: DC
  Filled 2018-02-11: qty 10

## 2018-02-11 MED ORDER — ONDANSETRON HCL 4 MG PO TABS: 4.0000 mg | ORAL_TABLET | Freq: Four times a day (QID) | ORAL | Status: DC | PRN

## 2018-02-11 MED ORDER — PANTOPRAZOLE SODIUM 40 MG PO TBEC
40.0000 mg | DELAYED_RELEASE_TABLET | Freq: Every day | ORAL | Status: DC
  Administered 2018-02-12: 40 mg via ORAL
  Filled 2018-02-11: qty 1

## 2018-02-11 MED ORDER — LIDOCAINE-EPINEPHRINE (PF) 2 %-1:200000 IJ SOLN
20.0000 mL | Freq: Once | INTRAMUSCULAR | Status: AC
  Administered 2018-02-11: 20 mL

## 2018-02-11 MED ORDER — LACTULOSE 10 GM/15ML PO SOLN
10.0000 g | Freq: Two times a day (BID) | ORAL | Status: DC
  Administered 2018-02-12: 10 g via ORAL
  Filled 2018-02-11: qty 15

## 2018-02-11 MED ORDER — MIDODRINE HCL 5 MG PO TABS
15.0000 mg | ORAL_TABLET | Freq: Three times a day (TID) | ORAL | Status: DC
  Administered 2018-02-11 – 2018-02-12 (×4): 15 mg via ORAL
  Filled 2018-02-11 (×2): qty 3

## 2018-02-11 MED ORDER — LIDOCAINE HCL (PF) 1 % IJ SOLN
INTRAMUSCULAR | Status: AC
  Filled 2018-02-11: qty 30

## 2018-02-11 MED ORDER — FERRIC CITRATE 1 GM 210 MG(FE) PO TABS
420.0000 mg | ORAL_TABLET | Freq: Three times a day (TID) | ORAL | Status: DC
  Administered 2018-02-11 – 2018-02-12 (×2): 420 mg via ORAL
  Filled 2018-02-11 (×5): qty 2

## 2018-02-11 MED ORDER — FENTANYL 25 MCG/HR TD PT72: 25.0000 ug | MEDICATED_PATCH | TRANSDERMAL | Status: DC

## 2018-02-11 MED ORDER — SODIUM CHLORIDE 0.9 % IV SOLN
1.0000 g | Freq: Once | INTRAVENOUS | Status: AC
  Administered 2018-02-11: 1 g via INTRAVENOUS
  Filled 2018-02-11: qty 10

## 2018-02-11 MED ORDER — OXYCODONE HCL 5 MG PO TABS
5.0000 mg | ORAL_TABLET | ORAL | Status: DC | PRN
  Administered 2018-02-11 – 2018-02-12 (×6): 5 mg via ORAL
  Filled 2018-02-11 (×5): qty 1

## 2018-02-11 MED ORDER — GUAIFENESIN ER 600 MG PO TB12: 600.0000 mg | ORAL_TABLET | Freq: Two times a day (BID) | ORAL | Status: DC | PRN

## 2018-02-11 NOTE — ED Provider Notes (Signed)
.  Paracentesis Date/Time: 02/11/2018 2:00 AM Performed by: Margarita Grizzleay, Tashaun Obey, MD Authorized by: Margarita Grizzleay, Shervon Kerwin, MD   Consent:    Consent obtained:  Written   Consent given by:  Patient   Risks discussed:  Bleeding, bowel perforation, infection and pain   Alternatives discussed:  No treatment and delayed treatment Pre-procedure details:    Procedure purpose:  Therapeutic   Preparation: Patient was prepped and draped in usual sterile fashion   Anesthesia (see MAR for exact dosages):    Anesthesia method:  Local infiltration   Local anesthetic:  Lidocaine 1% WITH epi Procedure details:    Needle gauge:  22   Ultrasound guidance: yes     Puncture site:  Midline infraumbilical   Fluid removed amount:  3000 cc   Fluid appearance:  Yellow   Dressing:  4x4 sterile gauze and adhesive bandage Post-procedure details:    Patient tolerance of procedure:  Tolerated well, no immediate complications      Margarita Grizzleay, Mareesa Gathright, MD 02/11/18 1433

## 2018-02-11 NOTE — H&P (Signed)
Date: 02/11/2018               Patient Name:  [name] MRN: [id number]  DOB: 01/24/1961 Age / Sex: 57 y.o., male   PCP: Dettinger, Elige RadonJoshua A, MD         Medical Service: Internal Medicine Teaching Service         Attending Physician: Dr. Earl LagosNarendra, Nischal, MD    First Contact: Dr. Alinda MoneyMelvin Pager: [phone]  Second Contact: Dr. Vincente LibertyMolt Pager: [phone]       After Hours (After 5p/  First Contact Pager: [phone]  weekends / holidays): Second Contact Pager: [phone]   Chief Complaint: Shortness of breath, Abdominal swelling, Lower extremity selling  History of Present Illness: [name] is a 57 yo M with Hx of Cirrhosis (Transplant workup on going); ESRD (On HD, transplant workup ongoing); Chronic back pain; HTN; DM; GERD who presents with dyspnea and abdominal swelling. Patient reports 2 weeks of progressive dyspnea, abdominal swelling, and lower extremity swelling. He has also had associated worsening of his chronic back pain and some chest wall tenderness. Patient has a history of cirrhosis and [facility] working with [facility] on transplant evaluation. He has never needed a paracentesis before. He was planning to have this performed as an outpatient but the patient states he was  unable to get papers signed in time by his dialysis center doctor. [facility] his liver failure is most likely due to heavy tylenol and NSAID use for chronic pain. He also endorses a wound on his right leg that has been draining significant fluid for the past 2 weeks (serosanguinous by his description). Volume overload has occurred despite HD as he does not have significant volumes removed with each session with concern for hypotension. He denies fevers, chills, nausea, constipation, diarrhea, headache, or sick contacts. He still makes a minimal amount of urine. Patient reports significant improvement in his dyspnea and abdominal distention following paracentesis  yielding 3L in ED.   In the ED: - Vitals: Soft BP (100s-110s systolic), otherwise stable - Labs: CMP showed K 3.1, Cr 5.31 (cosistnet with ESRD), Alb 2.3, AST and ALT WNL, ALP 238 (previous 255), T.Bili 2.1 (previous 4.7); CBC showed PLT 70 (previous 33); LA 2.44 (ESRD); Ammonia 100; Lipase WNL; POC Troponin 0.06 (in ESRD); PT 15.4; INR WNL; Blood Cultures Pending - EKG: NSR, 82 BPM, Lateral t-wave flattening, septal t-wave inversions  - CXR: Patchy bibasilar consolidation consistent with bibasilar pneumonia. Small pleural effusions bilaterally. Stable cardiac prominence. No evident adenopathy.  - Interventions: Morphine 4mg , Ceftriaxone 1g, Azithromycin 500mg , and Paracentesis yielding 3L  Patient to be admitted for further workup and care  Meds: Current Meds  Medication Sig  . albuterol (PROVENTIL HFA;VENTOLIN HFA) 108 (90 Base) MCG/ACT inhaler Inhale 2 puffs into the lungs every 6 (six) hours as needed for wheezing or shortness of breath.  . Calcium Carbonate Antacid (CALCIUM CARBONATE, DOSED IN MG ELEMENTAL CALCIUM,) 1250 MG/5ML SUSP Take 5 mLs (500 mg of elemental calcium total) by mouth every 6 (six) hours as needed for indigestion.  . fentaNYL (DURAGESIC - DOSED MCG/HR) 25 MCG/HR patch Place 25 mcg onto the skin every 3 (three) days.  Marland Kitchen. guaiFENesin (MUCINEX) 600 MG 12 hr tablet Take 2 tablets (1,200 mg total) by mouth 2 (two) times daily. (Patient taking differently: Take 600 mg by mouth 2 (two) times daily as needed for cough or  to loosen phlegm. )  . insulin aspart (NOVOLOG FLEXPEN) 100 UNIT/ML FlexPen INJECT 7 TO 15 UNITS subcu up to 4 times daily (Patient taking differently: Inject 5-7 Units into the skin 3 (three) times daily with meals. )  . Insulin Glargine (LANTUS SOLOSTAR) 100 UNIT/ML Solostar Pen Inject 26 Units into the skin daily. (Patient taking differently: Inject 35 Units into the skin daily. )  . lactulose (CHRONULAC) 10 GM/15ML solution Take 15 mLs (10 g total) by mouth 2  (two) times daily.  . midodrine (PROAMATINE) 10 MG tablet Take 1 tablet (10 mg total) by mouth 3 (three) times daily with meals. (Patient taking differently: Take 15 mg by mouth 3 (three) times daily with meals. )  . oxyCODONE (OXY IR/ROXICODONE) 5 MG immediate release tablet Take 1 tablet (5 mg total) by mouth every 4 (four) hours as needed for severe pain (dyspnea).  . pantoprazole (PROTONIX) 40 MG tablet Take 1 tablet (40 mg total) by mouth daily.  . rifaximin (XIFAXAN) 550 MG TABS tablet Take 1 tablet (550 mg total) by mouth 2 (two) times daily.    Allergies: Allergies as of 02/11/2018 - Review Complete 02/11/2018  Allergen Reaction Noted  . Ketorolac tromethamine Hives 03/04/2011  . Temsirolimus Other (See Comments) 03/20/2016   Past Medical History:  Diagnosis Date  . Chronic lower back pain   . Compression fracture    lumbar 3  . Diabetes mellitus   . Fracture acetabulum-closed (HCC) 04/09/2013  . GERD (gastroesophageal reflux disease)   . H/O Legionnaire's disease   . Hepatomegaly   . Migraine   . Osteoporosis   . Pancreatitis   . PTSD (post-traumatic stress disorder)   . Vitamin D deficiency     Family History:  Family History  Problem Relation Age of Onset  . Cancer Unknown        breast/grandmother, prostate/grandfather  - Reviewed on admission  Social History:  Social History   Tobacco Use  . Smoking status: Former Smoker    Packs/day: 0.50    Years: 26.00    Pack years: 13.00    Types: Cigarettes    Last attempt to quit: 08/26/2007    Years since quitting: 10.4  . Smokeless tobacco: Never Used  Substance Use Topics  . Alcohol use: Yes    Alcohol/week: 3.6 oz    Types: 6 Glasses of wine per week    Comment: 6 oz x 6 glass of wine daily   . Drug use: No  - Reviewed on admission  Review of Systems: A complete ROS was negative except as per HPI.  Physical Exam: Blood pressure 115/72, pulse 72, temperature 97.9 F (36.6 C), resp. rate (!) 22, height  5\' 8"  (1.727 m), SpO2 98 %. Physical Exam  Constitutional: He is oriented to person, place, and time. He appears well-developed. No distress.  Moderately malnourished  HENT:  Head: Normocephalic and atraumatic.  Eyes: EOM are normal. Right eye exhibits no discharge. Left eye exhibits no discharge.  Mild scleral icterus  Cardiovascular: Normal rate, regular rhythm, normal heart sounds and intact distal pulses.  Pulmonary/Chest: Effort normal. No respiratory distress.  Mild bibasilar rhonchi and crackles Auscultation complicated by patient possitioning  Abdominal: Soft. Bowel sounds are normal. He exhibits distension. There is tenderness.  Tenderness to palpation of RLQ Paracentesis site with significant drainage  Musculoskeletal:  2-3+ Pitting edema to bilateral knees Wrapped wound on RLE at distal leg Stasis changes  Neurological: He is alert and oriented to person, place,  and time.  Skin: Skin is warm and dry.  Psychiatric: He has a normal mood and affect. His behavior is normal.   EKG: personally reviewed my interpretation is NSR, 82 BPM, Lateral t-wave flattening, septal t-wave inversions   CXR: personally reviewed my interpretation is Patchy bibasilar consolidation consistent with bibasilar pneumonia. Small pleural effusions bilaterally. Stable cardiac prominence. No evident adenopathy.  Assessment & Plan by Problem: Principal Problem:   Decompensated hepatic cirrhosis (HCC) Active Problems:   Acute respiratory failure with hypoxia (HCC)   ESRD (end stage renal disease) on dialysis (HCC)   Thrombocytopenia (HCC)   Hepatorenal syndrome (HCC) 57 yo M with Hx of Cirrhosis (Transplant workup on going); ESRD (On HD, transplant workup ongoing); Chronic back pain; HTN; DM; GERD who presents with dyspnea and abdominal swelling.  Decompensated Cirrhosis  Acute Hypoxic Respiratory Failure  ? Pneumonia: Patient presented with 2 weeks of progressively worsening dyspnea in the setting  of worsening ascites and volume overload due to Cirrhosis and ESRD on HD as below. His CXR did show bilateral opacities however this may be vascular congestion in the setting of volume overload as opposed to CAP (given patient is afebrile without leukocytosis) Patient started on Ceftriaxone and Azithromycin in the ED, which was not continued. He is oxygenating at 98% on 2L.  > 3L paracentesis in ED, with improvement of patients sypnea - Body fluid LDH, Glu, Protein, Albumin, Cell Count, Gram stain, and Culture - Serum LDH - Continue home Rifaximin and Lactulose - AM CMP and CBC  ESRD: (On HD MWF). Fistula placed 12/10/2017. Dialysis has been challenging for him due to low blood pressure requiring midorine (prescription stats 10mg  TID, but patient reports 15mg  TID).  > Nephrology consulted for HD tomorrow  - Midodrine 15mg  TID - AM CMP  Hypotension/HTN: Patient has a history of HTN previously on HCTZ. He has been on midodrine for hypotension with HD - Midodrine 15mg  TID  DM: On Lantus 26U Daily and Novolog 9-17U QID - Lantus 15U - Novolog 5U TID - SSI-S + qhs  GERD: Continue home protonix Chronic back pain: Continue home Fentanyl patch (to be changed in the AM) and Oxycondone 5mg  q4h PRN  FEN: Renal, fluid restriction VTE ppx: None (plt<100) Code Status: Partial: Pressors and BiPAP only   Dispo: Admit patient to Inpatient with expected length of stay greater than 2 midnights.  Signed: Beola Cord, MD 02/11/2018, 4:04 PM  Pager: [phone]

## 2018-02-11 NOTE — Consult Note (Signed)
[facility] Renal Consultation Note    Indication for Consultation:  Management of ESRD/hemodialysis, anemia, hypertension/volume, and secondary hyperparathyroidism. PCP:  HPI: [name] is a 57 y.o. male with ESRD (d/t hepatorenal syndrome), alcoholic cirrhosis w/ thrombocytopenia, Type 2 DM, chronic LBP (Hx compression fractures), and GERD who was admitted with dyspnea and increasing ascites.  Pt reports increasing ascites for the past few weeks and then with more acute dyspnea which prompted ED eval. Cannot recall date when last paracentesis was. Denies fever or chills. No sharp abdominal pains. In ED, labs showed K 3.1, WBC 7.8, Hgb 13.6, Plts 70, LA 2.44. CXR with patch bibasilar infiltrates (pneumonia v. fluid). By the time I saw him, he had undergone paracentesis - looks like ~3L straw colored fluid removed. He reports that helped his dyspnea tremendously.   Dialyzes MWF at [facility] Last HD 02/10/18 which he completed in entirety but left 1.5kg above goal. Per notes, looks like HD unit has issues getting his fluid off due to chronic hypotension.   Past Medical History:  Diagnosis Date  . Chronic lower back pain   . Compression fracture    lumbar 3  . Diabetes mellitus   . Fracture acetabulum-closed (HCC) 04/09/2013  . GERD (gastroesophageal reflux disease)   . H/O Legionnaire's disease   . Hepatomegaly   . Migraine   . Osteoporosis   . Pancreatitis   . PTSD (post-traumatic stress disorder)   . Vitamin D deficiency    Past Surgical History:  Procedure Laterality Date  . AV FISTULA PLACEMENT Left 12/10/2017   Procedure: INSERTION OF ARTERIOVENOUS (AV) GORE-TEX GRAFT LEFT  ARM;  Surgeon: Chuck Hint, MD;  Location: [facility] OR;  Service: Vascular;  Laterality: Left;  . BACK SURGERY    . CHOLECYSTECTOMY    . HERNIA REPAIR    . IR FLUORO GUIDE CV LINE RIGHT  11/27/2017  . IR US GUIDE VASC ACCESS RIGHT  11/27/2017  . KYPHOPLASTY N/A 07/16/2015   Procedure:  Lumbar three kyphoplasty;  Surgeon: Coletta Memos, MD;  Location: [facility];  Service: Neurosurgery;  Laterality: N/A;  Lumbar three kyphoplasty  . LAMINECTOMY    . MASS EXCISION Left 06/12/2017   Procedure: EXCISION LEFT AXILLARY SEBACEOUS CYST;  Surgeon: Abigail Miyamoto, MD;  Location: [facility];  Service: General;  Laterality: Left;  . STERNUM FRACTURE SURGERY     Family History  Problem Relation Age of Onset  . Cancer Unknown        breast/grandmother, prostate/grandfather   Social History:  reports that he quit smoking about 10 years ago. His smoking use included cigarettes. He has a 13.00 pack-year smoking history. He has never used smokeless tobacco. He reports that he drinks about 3.6 oz of alcohol per week. He reports that he does not use drugs.  ROS:  - No fever, chills, CP + DOE, dyspnea, chest wall pain (chronic), + LE pain, + RLE wound, ascites  Physical Exam: Vitals:   02/11/18 1435 02/11/18 1500 02/11/18 1505 02/11/18 1510  BP: 107/72 100/73 117/79 115/78  Pulse: 88  72 72  Resp: 16 13 17 16   Temp:      SpO2: 95%  97% 99%  Height:         General: Frail man, laying supine with nasal oxygen. + jaundiced appearing. Head: Normocephalic, atraumatic, sclera non-icteric Neck: Supple without lymphadenopathy/masses. JVD not elevated. Lungs: Clear bilaterally anteriorly, unable to position for good posterior exam. Heart: RRR with normal S1, S2.  No murmurs, rubs, or gallops appreciated. Abdomen: Soft, non-tender, distended. Lower extremities: 2+ B LE edema, R lower leg bandaged, L shin with erythema/scaling Neuro: Alert and oriented X 3. Moves all extremities spontaneously. Psych:  Responds to questions appropriately with a normal affect. Dialysis Access: L forearm AVG + bruit. Bandaged area on R chest where [facility] was removed earlier today.  Allergies  Allergen Reactions  . Ketorolac Tromethamine Hives  . Temsirolimus Other (See Comments)    Unknown -  Pt does not recognize this medication [facility]   Prior to Admission medications   Medication Sig Start Date End Date Taking? Authorizing Provider  albuterol (PROVENTIL HFA;VENTOLIN HFA) 108 (90 Base) MCG/ACT inhaler Inhale 2 puffs into the lungs every 6 (six) hours as needed for wheezing or shortness of breath. 10/16/17   Remus Loffler, PA-C  Calcium Carbonate Antacid (CALCIUM CARBONATE, DOSED IN MG ELEMENTAL CALCIUM,) 1250 MG/5ML SUSP Take 5 mLs (500 mg of elemental calcium total) by mouth every 6 (six) hours as needed for indigestion. Patient not taking: Reported on 12/25/2017 12/13/17   Elgergawy, Leana Roe, MD  fentaNYL (DURAGESIC - DOSED MCG/HR) 12 MCG/HR Place 1 patch (12.5 mcg total) onto the skin every 3 (three) days. 12/13/17   Elgergawy, Leana Roe, MD  gabapentin (NEURONTIN) 100 MG capsule Take 2 capsules (200 mg total) by mouth daily. Patient not taking: Reported on 01/12/2018 12/14/17   Elgergawy, Leana Roe, MD  gabapentin (NEURONTIN) 300 MG capsule Take 1 capsule (300 mg total) by mouth every Monday, Wednesday, and Friday. Please take after hemodialysis Patient not taking: Reported on 12/25/2017 12/14/17   Elgergawy, Leana Roe, MD  Gauze Pads & Dressings (ABDOMINAL PAD) 8"X10" PADS 1 each by Does not apply route as needed. Use as needed 02/09/18   Dettinger, Elige Radon, MD  Gauze Pads & Dressings [facility] BANDAGE ROLL) MISC Use as needed 02/09/18   Dettinger, Elige Radon, MD  guaiFENesin (MUCINEX) 600 MG 12 hr tablet Take 2 tablets (1,200 mg total) by mouth 2 (two) times daily. Patient taking differently: Take 1,200 mg by mouth 2 (two) times daily as needed.  10/18/17   Narda Bonds, MD  hydrochlorothiazide (HYDRODIURIL) 25 MG tablet Take 0.5 tablets (12.5 mg total) by mouth 2 (two) times daily. Patient not taking: Reported on 01/12/2018 10/22/17   Dettinger, Elige Radon, MD  insulin aspart (NOVOLOG FLEXPEN) 100 UNIT/ML FlexPen INJECT 7 TO 15 UNITS subcu up to 4 times daily Patient taking  differently: INJECT 9 - 17 UNITS SQ UP TO FOUR TIMES A DAY 10/23/17   Dettinger, Elige Radon, MD  Insulin Glargine (LANTUS SOLOSTAR) 100 UNIT/ML Solostar Pen Inject 26 Units into the skin daily. Patient taking differently: Inject 28 Units into the skin daily.  12/13/17   Elgergawy, Leana Roe, MD  lactulose (CHRONULAC) 10 GM/15ML solution Take 15 mLs (10 g total) by mouth 2 (two) times daily. 12/13/17   Elgergawy, Leana Roe, MD  midodrine (PROAMATINE) 10 MG tablet Take 1 tablet (10 mg total) by mouth 3 (three) times daily with meals. Patient taking differently: Take 15 mg by mouth 3 (three) times daily with meals.  12/13/17   Elgergawy, Leana Roe, MD  milk thistle 175 MG tablet Take 175 mg by mouth 2 (two) times daily.    [provider]  oxyCODONE (OXY IR/ROXICODONE) 5 MG immediate release tablet Take 1 tablet (5 mg total) by mouth every 4 (four) hours as needed for severe pain (dyspnea). 12/25/17   Gerhard Munch, MD  pantoprazole (PROTONIX) 40 MG tablet Take 1 tablet (40 mg total) by mouth daily. 10/22/17   Dettinger, Elige Radon, MD  rifaximin (XIFAXAN) 550 MG TABS tablet Take 1 tablet (550 mg total) by mouth 2 (two) times daily. 12/13/17   Elgergawy, Leana Roe, MD  Wound Dressings (VASELINE PETROLATUM GAUZE) PADS Use as needed 02/09/18   Dettinger, Elige Radon, MD   Current Facility-Administered Medications  Medication Dose Route Frequency Provider Last Rate Last Dose  . [START ON 02/12/2018] Chlorhexidine Gluconate Cloth 2 % PADS 6 each  6 each Topical Q0600 Senon Nixon R, PA-C      . lidocaine (PF) (XYLOCAINE) 1 % injection           . lidocaine-EPINEPHrine (XYLOCAINE W/EPI) 1 %-1:100000 (with pres) injection 10 mL  10 mL Intradermal Once Eyvonne Mechanic, PA-C       Current Outpatient Medications  Medication Sig Dispense Refill  . albuterol (PROVENTIL HFA;VENTOLIN HFA) 108 (90 Base) MCG/ACT inhaler Inhale 2 puffs into the lungs every 6 (six) hours as needed for wheezing or shortness of breath. 1  Inhaler 6  . Calcium Carbonate Antacid (CALCIUM CARBONATE, DOSED IN MG ELEMENTAL CALCIUM,) 1250 MG/5ML SUSP Take 5 mLs (500 mg of elemental calcium total) by mouth every 6 (six) hours as needed for indigestion. (Patient not taking: Reported on 12/25/2017) 450 mL 0  . fentaNYL (DURAGESIC - DOSED MCG/HR) 12 MCG/HR Place 1 patch (12.5 mcg total) onto the skin every 3 (three) days. 5 patch 0  . gabapentin (NEURONTIN) 100 MG capsule Take 2 capsules (200 mg total) by mouth daily. (Patient not taking: Reported on 01/12/2018) 30 capsule 0  . gabapentin (NEURONTIN) 300 MG capsule Take 1 capsule (300 mg total) by mouth every Monday, Wednesday, and Friday. Please take after hemodialysis (Patient not taking: Reported on 12/25/2017) 30 capsule 0  . Gauze Pads & Dressings (ABDOMINAL PAD) 8"X10" PADS 1 each by Does not apply route as needed. Use as needed 360 each 0  . Gauze Pads & Dressings (KERLIX BANDAGE ROLL) MISC Use as needed 96 each 1  . guaiFENesin (MUCINEX) 600 MG 12 hr tablet Take 2 tablets (1,200 mg total) by mouth 2 (two) times daily. (Patient taking differently: Take 1,200 mg by mouth 2 (two) times daily as needed. ) 30 tablet 0  . hydrochlorothiazide (HYDRODIURIL) 25 MG tablet Take 0.5 tablets (12.5 mg total) by mouth 2 (two) times daily. (Patient not taking: Reported on 01/12/2018) 180 tablet 1  . insulin aspart (NOVOLOG FLEXPEN) 100 UNIT/ML FlexPen INJECT 7 TO 15 UNITS subcu up to 4 times daily (Patient taking differently: INJECT 9 - 17 UNITS SQ UP TO FOUR TIMES A DAY) 45 mL 0  . Insulin Glargine (LANTUS SOLOSTAR) 100 UNIT/ML Solostar Pen Inject 26 Units into the skin daily. (Patient taking differently: Inject 28 Units into the skin daily. ) 16 mL 1  . lactulose (CHRONULAC) 10 GM/15ML solution Take 15 mLs (10 g total) by mouth 2 (two) times daily. 240 mL 0  . midodrine (PROAMATINE) 10 MG tablet Take 1 tablet (10 mg total) by mouth 3 (three) times daily with meals. (Patient taking differently: Take 15 mg by  mouth 3 (three) times daily with meals. ) 90 tablet 0  . milk thistle 175 MG tablet Take 175 mg by mouth 2 (two) times daily.    Marland Kitchen oxyCODONE (OXY IR/ROXICODONE) 5 MG immediate release tablet Take 1 tablet (5 mg total) by mouth every 4 (four) hours as needed for severe pain (dyspnea).  20 tablet 0  . pantoprazole (PROTONIX) 40 MG tablet Take 1 tablet (40 mg total) by mouth daily. 90 tablet 3  . rifaximin (XIFAXAN) 550 MG TABS tablet Take 1 tablet (550 mg total) by mouth 2 (two) times daily. 42 tablet 0  . Wound Dressings (VASELINE PETROLATUM GAUZE) PADS Use as needed 50 each 1   Labs: Basic Metabolic Panel: Recent Labs  Lab 02/11/18 1037  NA 136  K 3.1*  CL 97*  CO2 26  GLUCOSE 163*  BUN 19  CREATININE 5.31*  CALCIUM 8.6*   Liver Function Tests: Recent Labs  Lab 02/11/18 1037  AST 32  ALT 17  ALKPHOS 238*  BILITOT 2.1*  PROT 6.5  ALBUMIN 2.3*   Recent Labs  Lab 02/11/18 1037  LIPASE 18   Recent Labs  Lab 02/11/18 1037  AMMONIA 100*   CBC: Recent Labs  Lab 02/11/18 1037  WBC 7.8  NEUTROABS 5.4  HGB 13.8  HCT 44.7  MCV 95.1  PLT 70*   Studies/Results: Dg Chest Port 1 View  Result Date: 02/11/2018 CLINICAL DATA:  Shortness of breath EXAM: PORTABLE CHEST 1 VIEW COMPARISON:  November 22, 2017 FINDINGS: There is patchy consolidation in both lung bases with small pleural effusions bilaterally. Lungs elsewhere are clear. Heart is mildly enlarged with pulmonary vascularity normal. No adenopathy. Bones are osteoporotic. Postoperative change in the sternal region noted. IMPRESSION: Patchy bibasilar consolidation consistent with bibasilar pneumonia. Small pleural effusions bilaterally. Stable cardiac prominence. No evident adenopathy. Electronically Signed   By: Bretta BangWilliam  Woodruff III M.D.   On: 02/11/2018 10:55   [facility], 400/800, EDW 72kg, 2K/2.25Ca, AVG, heparin 2300 bolus - No ESA or VDRA  Assessment/Plan: 1.  Dyspnea (HCAP v.  fluid on CXR): Being covered with Ceftriaxone/azithromycin, dyspnea much improved s/p paracentesis, no urgent HD need tonight. Will plan on trying to get more fluid with HD tomorrow. 2.  ESRD: Continue HD per MWF schedule. No heparin while here. 3.  Hypertension/volume: Chronic low sided BP, on midodrine 10mg  TID as outpt. UF as tolerated with HD. 4.  Anemia: Hgb > 13. No ESA needed. 5.  Metabolic bone disease: Corr Ca ok, Phos pending. Continue binders Gean Quint(Auryxia). 6.  Nutrition: Alb low d/t liver disease, adding supplements. 7. Cirrhosis (as above): S/p 3L therapeutic paracentesis. 8. Type 2 DM: Per primary.   Ozzie HoyleKatie Jone Panebianco, PA-C 02/11/2018, 3:16 PM  [facility] Pager: [phone]

## 2018-02-11 NOTE — ED Notes (Signed)
Attempted report x1. 

## 2018-02-11 NOTE — ED Triage Notes (Signed)
Pt arrives complaining of 2 weeks of shortness of breath with abdominal swelling. Pt is a dialysis pt, completed dialysis yesterday. Hx of liver failure. Abdomen is significantly distended.

## 2018-02-11 NOTE — ED Provider Notes (Signed)
[facility] EMERGENCY DEPARTMENT Provider Note   CSN: [id number] Arrival date & time: 02/11/18  1308     History   Chief Complaint Chief Complaint  Patient presents with  . fluid retention  . Shortness of Breath    HPI [name] is a 57 y.o. male.  He has a history of end-stage renal disease and is on dialysis.  He also has cirrhosis and liver failure.  He said he is working on getting on the transplant list at [facility] for kidney and liver.  He has a GI doctor through the [place] that he saw within the last few weeks.  He is complaining of increased shortness of breath due to increased abdominal distention and increased pain in his abdomen and back.  He is on fentanyl patch and oxycodone as needed.  Over the past 2 weeks he said large increase in abdominal distention and is having difficulty laying down flat to breathe because of it.  There is been no fevers no nausea no vomiting.  He does not make much urine.  Dialysis does not seem to be improving his symptoms and his blood pressures are so low that having difficulty getting much fluid off him.  The history is provided by the patient.  Shortness of Breath  This is a new problem. The average episode lasts 2 weeks. The problem occurs continuously.The problem has been gradually worsening. Associated symptoms include cough, orthopnea, chest pain, abdominal pain, leg pain and leg swelling. Pertinent negatives include no fever, no headaches, no rhinorrhea, no sore throat, no neck pain, no sputum production, no hemoptysis, no syncope, no vomiting and no rash. It is unknown what precipitated the problem. He has had prior hospitalizations. He has had prior ED visits.    Past Medical History:  Diagnosis Date  . Chronic lower back pain   . Compression fracture    lumbar 3  . Diabetes mellitus   . Fracture acetabulum-closed (HCC) 04/09/2013  . GERD (gastroesophageal reflux disease)   . H/O Legionnaire's disease   .  Hepatomegaly   . Migraine   . Osteoporosis   . Pancreatitis   . PTSD (post-traumatic stress disorder)   . Vitamin D deficiency     Patient Active Problem List   Diagnosis Date Noted  . Pain, generalized   . Thrombocytopenia (HCC) 11/25/2017  . Alcohol abuse 11/25/2017  . ESRD (end stage renal disease) on dialysis (HCC)   . Goals of care, counseling/discussion   . Palliative care by specialist   . Acute renal failure (ARF) (HCC)   . Cirrhosis (HCC) 11/07/2017  . Influenza A 10/16/2017  . Closed fracture of second lumbar vertebra (HCC)   . Compression fracture of L3 lumbar vertebra with delayed healing 07/16/2015  . Compression fracture of lumbar vertebra (HCC) 07/16/2015  . Pressure ulcer 01/18/2015  . Acute respiratory failure with hypoxia (HCC)   . Hypoxia   . Lumbar foraminal stenosis 04/19/2013  . Acetabulum fracture, right (HCC) 04/09/2013  . Type 2 diabetes mellitus (HCC) 04/09/2013  . CIRRHOSIS, ALCOHOLIC 06/12/2010  . LIVER FUNCTION TESTS, ABNORMAL, HX OF 10/02/2009  . PANCREATITIS, HX OF 09/27/2009  . HYPOGONADISM 08/29/2009  . VITAMIN D DEFICIENCY 08/29/2009  . OSTEOPOROSIS 02/22/2009  . Hypertension associated with diabetes (HCC) 02/08/2009  . GERD 02/08/2009  . BACK PAIN, LUMBAR, CHRONIC 02/08/2009  . LEG EDEMA, LEFT 02/08/2009  . Hepatomegaly 02/08/2009    Past Surgical History:  Procedure Laterality Date  . AV FISTULA PLACEMENT Left  12/10/2017   Procedure: INSERTION OF ARTERIOVENOUS (AV) GORE-TEX GRAFT LEFT  ARM;  Surgeon: Chuck Hint, MD;  Location: [facility] OR;  Service: Vascular;  Laterality: Left;  . BACK SURGERY    . CHOLECYSTECTOMY    . HERNIA REPAIR    . IR FLUORO GUIDE CV LINE RIGHT  11/27/2017  . IR US GUIDE VASC ACCESS RIGHT  11/27/2017  . KYPHOPLASTY N/A 07/16/2015   Procedure: Lumbar three kyphoplasty;  Surgeon: Coletta Memos, MD;  Location: [facility];  Service: Neurosurgery;  Laterality: N/A;  Lumbar three kyphoplasty  . LAMINECTOMY      . MASS EXCISION Left 06/12/2017   Procedure: EXCISION LEFT AXILLARY SEBACEOUS CYST;  Surgeon: Abigail Miyamoto, MD;  Location: [facility];  Service: General;  Laterality: Left;  . STERNUM FRACTURE SURGERY          Home Medications    Prior to Admission medications   Medication Sig Start Date End Date Taking? Authorizing Provider  albuterol (PROVENTIL HFA;VENTOLIN HFA) 108 (90 Base) MCG/ACT inhaler Inhale 2 puffs into the lungs every 6 (six) hours as needed for wheezing or shortness of breath. 10/16/17   Remus Loffler, PA-C  Calcium Carbonate Antacid (CALCIUM CARBONATE, DOSED IN MG ELEMENTAL CALCIUM,) 1250 MG/5ML SUSP Take 5 mLs (500 mg of elemental calcium total) by mouth every 6 (six) hours as needed for indigestion. Patient not taking: Reported on 12/25/2017 12/13/17   Elgergawy, Leana Roe, MD  fentaNYL (DURAGESIC - DOSED MCG/HR) 12 MCG/HR Place 1 patch (12.5 mcg total) onto the skin every 3 (three) days. 12/13/17   Elgergawy, Leana Roe, MD  gabapentin (NEURONTIN) 100 MG capsule Take 2 capsules (200 mg total) by mouth daily. Patient not taking: Reported on 01/12/2018 12/14/17   Elgergawy, Leana Roe, MD  gabapentin (NEURONTIN) 300 MG capsule Take 1 capsule (300 mg total) by mouth every Monday, Wednesday, and Friday. Please take after hemodialysis Patient not taking: Reported on 12/25/2017 12/14/17   Elgergawy, Leana Roe, MD  Gauze Pads & Dressings (ABDOMINAL PAD) 8"X10" PADS 1 each by Does not apply route as needed. Use as needed 02/09/18   Dettinger, Elige Radon, MD  Gauze Pads & Dressings [facility] BANDAGE ROLL) MISC Use as needed 02/09/18   Dettinger, Elige Radon, MD  guaiFENesin (MUCINEX) 600 MG 12 hr tablet Take 2 tablets (1,200 mg total) by mouth 2 (two) times daily. Patient taking differently: Take 1,200 mg by mouth 2 (two) times daily as needed.  10/18/17   Narda Bonds, MD  hydrochlorothiazide (HYDRODIURIL) 25 MG tablet Take 0.5 tablets (12.5 mg total) by mouth 2 (two) times  daily. Patient not taking: Reported on 01/12/2018 10/22/17   Dettinger, Elige Radon, MD  insulin aspart (NOVOLOG FLEXPEN) 100 UNIT/ML FlexPen INJECT 7 TO 15 UNITS subcu up to 4 times daily Patient taking differently: INJECT 9 - 17 UNITS SQ UP TO FOUR TIMES A DAY 10/23/17   Dettinger, Elige Radon, MD  Insulin Glargine (LANTUS SOLOSTAR) 100 UNIT/ML Solostar Pen Inject 26 Units into the skin daily. Patient taking differently: Inject 28 Units into the skin daily.  12/13/17   Elgergawy, Leana Roe, MD  lactulose (CHRONULAC) 10 GM/15ML solution Take 15 mLs (10 g total) by mouth 2 (two) times daily. 12/13/17   Elgergawy, Leana Roe, MD  midodrine (PROAMATINE) 10 MG tablet Take 1 tablet (10 mg total) by mouth 3 (three) times daily with meals. Patient taking differently: Take 15 mg by mouth 3 (three) times daily with meals.  12/13/17  Elgergawy, Leana Roeawood S, MD  milk thistle 175 MG tablet Take 175 mg by mouth 2 (two) times daily.    [provider]  oxyCODONE (OXY IR/ROXICODONE) 5 MG immediate release tablet Take 1 tablet (5 mg total) by mouth every 4 (four) hours as needed for severe pain (dyspnea). 12/25/17   Gerhard MunchLockwood, Robert, MD  pantoprazole (PROTONIX) 40 MG tablet Take 1 tablet (40 mg total) by mouth daily. 10/22/17   Dettinger, Elige RadonJoshua A, MD  rifaximin (XIFAXAN) 550 MG TABS tablet Take 1 tablet (550 mg total) by mouth 2 (two) times daily. 12/13/17   Elgergawy, Leana Roeawood S, MD  Wound Dressings (VASELINE PETROLATUM GAUZE) PADS Use as needed 02/09/18   Dettinger, Elige RadonJoshua A, MD    Family History Family History  Problem Relation Age of Onset  . Cancer Unknown        breast/grandmother, prostate/grandfather    Social History Social History   Tobacco Use  . Smoking status: Former Smoker    Packs/day: 0.50    Years: 26.00    Pack years: 13.00    Types: Cigarettes    Last attempt to quit: 08/26/2007    Years since quitting: 10.4  . Smokeless tobacco: Never Used  Substance Use Topics  . Alcohol use: Yes     Alcohol/week: 3.6 oz    Types: 6 Glasses of wine per week    Comment: 6 oz x 6 glass of wine daily   . Drug use: No     Allergies   Ketorolac tromethamine and Temsirolimus   Review of Systems Review of Systems  Constitutional: Negative for chills and fever.  HENT: Negative for rhinorrhea and sore throat.   Eyes: Negative for pain and visual disturbance.  Respiratory: Positive for cough and shortness of breath. Negative for hemoptysis and sputum production.   Cardiovascular: Positive for chest pain, orthopnea and leg swelling. Negative for syncope.  Gastrointestinal: Positive for abdominal pain. Negative for vomiting.  Genitourinary: Negative for hematuria.  Musculoskeletal: Positive for back pain. Negative for neck pain.  Skin: Positive for wound (r lower leg). Negative for rash.  Neurological: Negative for speech difficulty and headaches.     Physical Exam Updated Vital Signs BP 105/74   Pulse 90   Temp 97.9 F (36.6 C)   Resp (!) 24   Ht 5\' 8"  (1.727 m)   SpO2 90%   BMI 25.38 kg/m   Physical Exam  Constitutional: He is oriented to person, place, and time. He appears ill. No distress.  HENT:  Head: Normocephalic and atraumatic.  Mouth/Throat: Oropharynx is clear and moist.  Eyes: Pupils are equal, round, and reactive to light. EOM are normal.  Neck: Normal range of motion.  Cardiovascular: Normal rate, regular rhythm, normal heart sounds and intact distal pulses.  Pulmonary/Chest: Effort normal. He has rhonchi in the right lower field and the left lower field.  Abdominal: He exhibits distension and ascites. There is tenderness. There is no rebound and no guarding.  Musculoskeletal: Normal range of motion.       Right lower leg: He exhibits tenderness (wound lower leg) and edema.       Left lower leg: He exhibits edema.  Neurological: He is alert and oriented to person, place, and time.  Skin: Skin is warm and dry. Capillary refill takes less than 2 seconds.      ED Treatments / Results  Labs (all labs ordered are listed, but only abnormal results are displayed) Labs Reviewed  COMPREHENSIVE METABOLIC PANEL - Abnormal;  Notable for the following components:      Result Value   Potassium 3.1 (*)    Chloride 97 (*)    Glucose, Bld 163 (*)    Creatinine, Ser 5.31 (*)    Calcium 8.6 (*)    Albumin 2.3 (*)    Alkaline Phosphatase 238 (*)    Total Bilirubin 2.1 (*)    GFR calc non Af Amer 11 (*)    GFR calc Af Amer 13 (*)    All other components within normal limits  CBC WITH DIFFERENTIAL/PLATELET - Abnormal; Notable for the following components:   RDW 16.8 (*)    Platelets 70 (*)    All other components within normal limits  PROTIME-INR - Abnormal; Notable for the following components:   Prothrombin Time 15.4 (*)    All other components within normal limits  AMMONIA - Abnormal; Notable for the following components:   Ammonia 100 (*)    All other components within normal limits  LACTATE DEHYDROGENASE, PLEURAL OR PERITONEAL FLUID - Abnormal; Notable for the following components:   LD, Fluid 47 (*)    All other components within normal limits  COMPREHENSIVE METABOLIC PANEL - Abnormal; Notable for the following components:   Sodium 133 (*)    Potassium 3.3 (*)    Chloride 95 (*)    Glucose, Bld 355 (*)    BUN 28 (*)    Creatinine, Ser 6.15 (*)    Calcium 8.2 (*)    Total Protein 5.7 (*)    Albumin 2.1 (*)    ALT 15 (*)    Alkaline Phosphatase 223 (*)    GFR calc non Af Amer 9 (*)    GFR calc Af Amer 11 (*)    All other components within normal limits  CBC - Abnormal; Notable for the following components:   RDW 16.4 (*)    Platelets 66 (*)    All other components within normal limits  PROTIME-INR - Abnormal; Notable for the following components:   Prothrombin Time 16.0 (*)    All other components within normal limits  BODY FLUID CELL COUNT WITH DIFFERENTIAL - Abnormal; Notable for the following components:    Monocyte-Macrophage-Serous Fluid 19 (*)    All other components within normal limits  I-STAT CG4 LACTIC ACID, ED - Abnormal; Notable for the following components:   Lactic Acid, Venous 2.44 (*)    All other components within normal limits  GRAM STAIN  MRSA PCR SCREENING  CULTURE, BLOOD (ROUTINE X 2)  CULTURE, BLOOD (ROUTINE X 2)  CULTURE, BODY FLUID-BOTTLE  LIPASE, BLOOD  GLUCOSE, PLEURAL OR PERITONEAL FLUID  PROTEIN, PLEURAL OR PERITONEAL FLUID  ALBUMIN, PLEURAL OR PERITONEAL FLUID  LACTATE DEHYDROGENASE  APTT  PATHOLOGIST SMEAR REVIEW  I-STAT TROPONIN, ED  I-STAT CG4 LACTIC ACID, ED    EKG EKG Interpretation  Date/Time:  Thursday February 11 2018 10:52:02 EDT Ventricular Rate:  82 PR Interval:    QRS Duration: 95 QT Interval:  419 QTC Calculation: 490 R Axis:   -8 Text Interpretation:  Sinus rhythm Probable lateral infarct, old nonspecific t wave inversions new from prior 2/19 Confirmed by Meridee Score [phone]) on 02/11/2018 11:02:11 AM Also confirmed by Meridee Score [phone]), editor [place], Tamera Punt ([id number])  on 02/11/2018 2:30:52 PM   Radiology Dg Chest Port 1 View  Result Date: 02/11/2018 CLINICAL DATA:  Shortness of breath EXAM: PORTABLE CHEST 1 VIEW COMPARISON:  November 22, 2017 FINDINGS: There is patchy consolidation in both lung bases with small  pleural effusions bilaterally. Lungs elsewhere are clear. Heart is mildly enlarged with pulmonary vascularity normal. No adenopathy. Bones are osteoporotic. Postoperative change in the sternal region noted. IMPRESSION: Patchy bibasilar consolidation consistent with bibasilar pneumonia. Small pleural effusions bilaterally. Stable cardiac prominence. No evident adenopathy. Electronically Signed   By: Bretta Bang III M.D.   On: 02/11/2018 10:55    Procedures Procedures (including critical care time)  Medications Ordered in ED Medications  morphine 4 MG/ML injection 4 mg (has no administration in time range)     Initial  Impression / Assessment and Plan / ED Course  I have reviewed the triage vital signs and the nursing notes.  Pertinent labs & imaging results that were available during my care of the patient were reviewed by me and considered in my medical decision making (see chart for details).  Clinical Course as of Feb 12 1240  Thu Feb 11, 2018  1216 I talked the patient about the chest x-ray results of pneumonia.  I will cover him with antibiotics but I think this likely is a lot to do with just his fluid overload and volume in his abdomen causing these findings.  He does have an elevated lactate but that is just is likely due to his renal dysfunction liver dysfunction.  I talked to the admitting team and they will evaluate him for admission.  We will try to get his paracentesis done in the ED see if that can make him breathe more comfortably.   [MB]  1230 Discussed with the residents from the internal medicine teaching service who will be admitting him to their service.  We will attempt to get a paracentesis done here and I am starting to cover him with antibiotics for possible pneumonia.  Patient is agreeable to admission.   [MB]    Clinical Course User Index [MB] Terrilee Files, MD     Final Clinical Impressions(s) / ED Diagnoses   Final diagnoses:  Community acquired pneumonia, unspecified laterality  Cirrhosis of liver with ascites, unspecified hepatic cirrhosis type [facility])    ED Discharge Orders    None       Terrilee Files, MD 02/12/18 [phone]

## 2018-02-11 NOTE — ED Provider Notes (Deleted)
  Physical Exam  BP 107/72   Pulse 88   Temp 97.9 F (36.6 C)   Resp 16   Ht 5\' 8"  (1.727 m)   SpO2 95%   BMI 25.38 kg/m   ED Course/Procedures    .Paracentesis Date/Time: 02/11/2018 2:46 PM Performed by: Eyvonne MechanicHedges, Fabrice Dyal, PA-C Authorized by: Eyvonne MechanicHedges, Dejha King, PA-C   Consent:    Consent obtained:  Verbal   Consent given by:  Patient   Risks discussed:  Bleeding, bowel perforation, infection and pain   Alternatives discussed:  Alternative treatment, observation, no treatment, delayed treatment and referral Pre-procedure details:    Procedure purpose:  Therapeutic   Preparation: Patient was prepped and draped in usual sterile fashion   Anesthesia (see MAR for exact dosages):    Anesthesia method:  Local infiltration   Local anesthetic:  Lidocaine 2% WITH epi Procedure details:    Needle gauge:  18   Puncture site:  R lower quadrant   Fluid removed amount:  3700   Fluid appearance:  Yellow   Dressing:  Adhesive bandage and 4x4 sterile gauze Post-procedure details:    Patient tolerance of procedure:  Tolerated well, no immediate complications    MDM        Eyvonne MechanicHedges, Aylana Hirschfeld, PA-C 02/11/18 1448

## 2018-02-11 NOTE — ED Notes (Addendum)
Pt given Malaw[place]urkey sandwich and apple juice. Pt requesting pain medication and for his fentanyl patch to be changed

## 2018-02-12 ENCOUNTER — Inpatient Hospital Stay (HOSPITAL_COMMUNITY): Payer: Medicare Other

## 2018-02-12 DIAGNOSIS — R188 Other ascites: Secondary | ICD-10-CM | POA: Diagnosis not present

## 2018-02-12 DIAGNOSIS — I120 Hypertensive chronic kidney disease with stage 5 chronic kidney disease or end stage renal disease: Secondary | ICD-10-CM | POA: Diagnosis not present

## 2018-02-12 DIAGNOSIS — K7031 Alcoholic cirrhosis of liver with ascites: Secondary | ICD-10-CM | POA: Diagnosis not present

## 2018-02-12 DIAGNOSIS — J9601 Acute respiratory failure with hypoxia: Secondary | ICD-10-CM | POA: Diagnosis not present

## 2018-02-12 DIAGNOSIS — D696 Thrombocytopenia, unspecified: Secondary | ICD-10-CM | POA: Diagnosis not present

## 2018-02-12 DIAGNOSIS — K7460 Unspecified cirrhosis of liver: Secondary | ICD-10-CM | POA: Diagnosis not present

## 2018-02-12 DIAGNOSIS — E1122 Type 2 diabetes mellitus with diabetic chronic kidney disease: Secondary | ICD-10-CM | POA: Diagnosis not present

## 2018-02-12 DIAGNOSIS — K219 Gastro-esophageal reflux disease without esophagitis: Secondary | ICD-10-CM | POA: Diagnosis not present

## 2018-02-12 DIAGNOSIS — Z992 Dependence on renal dialysis: Secondary | ICD-10-CM | POA: Diagnosis not present

## 2018-02-12 DIAGNOSIS — Z888 Allergy status to other drugs, medicaments and biological substances status: Secondary | ICD-10-CM

## 2018-02-12 DIAGNOSIS — E8770 Fluid overload, unspecified: Secondary | ICD-10-CM | POA: Diagnosis not present

## 2018-02-12 DIAGNOSIS — N2581 Secondary hyperparathyroidism of renal origin: Secondary | ICD-10-CM | POA: Diagnosis not present

## 2018-02-12 DIAGNOSIS — K767 Hepatorenal syndrome: Secondary | ICD-10-CM | POA: Diagnosis not present

## 2018-02-12 DIAGNOSIS — N186 End stage renal disease: Secondary | ICD-10-CM | POA: Diagnosis not present

## 2018-02-12 DIAGNOSIS — G8929 Other chronic pain: Secondary | ICD-10-CM | POA: Diagnosis not present

## 2018-02-12 DIAGNOSIS — Z79899 Other long term (current) drug therapy: Secondary | ICD-10-CM

## 2018-02-12 DIAGNOSIS — R0602 Shortness of breath: Secondary | ICD-10-CM | POA: Diagnosis not present

## 2018-02-12 DIAGNOSIS — M549 Dorsalgia, unspecified: Secondary | ICD-10-CM | POA: Diagnosis not present

## 2018-02-12 DIAGNOSIS — I9589 Other hypotension: Secondary | ICD-10-CM

## 2018-02-12 LAB — COMPREHENSIVE METABOLIC PANEL
ALBUMIN: 2.1 g/dL — AB (ref 3.5–5.0)
ALT: 15 U/L — ABNORMAL LOW (ref 17–63)
ANION GAP: 12 (ref 5–15)
AST: 27 U/L (ref 15–41)
Alkaline Phosphatase: 223 U/L — ABNORMAL HIGH (ref 38–126)
BILIRUBIN TOTAL: UNDETERMINED mg/dL (ref 0.3–1.2)
BUN: 28 mg/dL — ABNORMAL HIGH (ref 6–20)
CHLORIDE: 95 mmol/L — AB (ref 101–111)
CO2: 26 mmol/L (ref 22–32)
Calcium: 8.2 mg/dL — ABNORMAL LOW (ref 8.9–10.3)
Creatinine, Ser: 6.15 mg/dL — ABNORMAL HIGH (ref 0.61–1.24)
GFR calc Af Amer: 11 mL/min — ABNORMAL LOW (ref >60)
GFR calc non Af Amer: 9 mL/min — ABNORMAL LOW (ref >60)
GLUCOSE: 355 mg/dL — AB (ref 65–99)
POTASSIUM: 3.3 mmol/L — AB (ref 3.5–5.1)
Sodium: 133 mmol/L — ABNORMAL LOW (ref 135–145)
TOTAL PROTEIN: 5.7 g/dL — AB (ref 6.5–8.1)

## 2018-02-12 LAB — PROTIME-INR
INR: 1.29
PROTHROMBIN TIME: 16 s — AB (ref 11.4–15.2)

## 2018-02-12 LAB — CBC
HEMATOCRIT: 44.6 % (ref 39.0–52.0)
Hemoglobin: 14.3 g/dL (ref 13.0–17.0)
MCH: 29.2 pg (ref 26.0–34.0)
MCHC: 32.1 g/dL (ref 30.0–36.0)
MCV: 91.2 fL (ref 78.0–100.0)
Platelets: 66 10*3/uL — ABNORMAL LOW (ref 150–400)
RBC: 4.89 MIL/uL (ref 4.22–5.81)
RDW: 16.4 % — ABNORMAL HIGH (ref 11.5–15.5)
WBC: 5.7 10*3/uL (ref 4.0–10.5)

## 2018-02-12 LAB — LACTATE DEHYDROGENASE: LDH: 154 U/L (ref 98–192)

## 2018-02-12 LAB — HEPATITIS B SURFACE ANTIGEN: HEP B S AG: NEGATIVE

## 2018-02-12 LAB — GLUCOSE, CAPILLARY
GLUCOSE-CAPILLARY: 294 mg/dL — AB (ref 65–99)
Glucose-Capillary: 368 mg/dL — ABNORMAL HIGH (ref 65–99)

## 2018-02-12 LAB — BILIRUBIN, TOTAL: BILIRUBIN TOTAL: 1.9 mg/dL — AB (ref 0.3–1.2)

## 2018-02-12 LAB — PATHOLOGIST SMEAR REVIEW

## 2018-02-12 LAB — APTT: aPTT: 35 seconds (ref 24–36)

## 2018-02-12 MED ORDER — MIDODRINE HCL 5 MG PO TABS
ORAL_TABLET | ORAL | Status: AC
  Filled 2018-02-12: qty 3

## 2018-02-12 MED ORDER — OXYCODONE HCL 5 MG PO TABS: 5.0000 mg | ORAL_TABLET | ORAL | 0 refills | Status: AC | PRN

## 2018-02-12 MED ORDER — INSULIN GLARGINE 100 UNIT/ML ~~LOC~~ SOLN
20.0000 [IU] | Freq: Every day | SUBCUTANEOUS | Status: DC
  Filled 2018-02-12: qty 0.2

## 2018-02-12 MED ORDER — OXYCODONE HCL 5 MG PO TABS
ORAL_TABLET | ORAL | Status: AC
  Filled 2018-02-12: qty 1

## 2018-02-12 MED ORDER — INSULIN ASPART 100 UNIT/ML ~~LOC~~ SOLN
0.0000 [IU] | Freq: Three times a day (TID) | SUBCUTANEOUS | Status: DC
  Administered 2018-02-12: 5 [IU] via SUBCUTANEOUS
  Administered 2018-02-12: 9 [IU] via SUBCUTANEOUS

## 2018-02-12 MED ORDER — INSULIN ASPART 100 UNIT/ML ~~LOC~~ SOLN
0.0000 [IU] | Freq: Every day | SUBCUTANEOUS | Status: DC
Start: 2018-02-12 — End: 2018-02-12

## 2018-02-12 MED ORDER — INSULIN ASPART 100 UNIT/ML ~~LOC~~ SOLN
5.0000 [IU] | Freq: Three times a day (TID) | SUBCUTANEOUS | Status: DC
  Administered 2018-02-12: 5 [IU] via SUBCUTANEOUS

## 2018-02-12 MED ORDER — MIDODRINE HCL 5 MG PO TABS
ORAL_TABLET | ORAL | Status: AC
  Filled 2018-02-12: qty 15

## 2018-02-12 NOTE — Care Management Obs Status (Signed)
MEDICARE OBSERVATION STATUS NOTIFICATION   Patient Details  Name: [name] MRN: [id number] Date of Birth: 05/10/1961   Medicare Observation Status Notification Given:  Waylan BogaYes    Ninoshka Wainwright, RN 02/12/2018, 6:25 PM

## 2018-02-12 NOTE — Progress Notes (Signed)
[facility] ROUNDING NOTE   Subjective:   Interval History:  56 ESRD  Hepatorenal and alcoholic cirrhosis type 2 diabetes and chronic LBP and GERD that usually dialyzes MWF [facility]  He presented with increase in ascites and had a large volume paracentesis performed 6/21    He was seen on dialysis and appears to have less shortness of breath    Objective:  Vital signs in last 24 hours:  Temp:  [97.4 F (36.3 C)-97.8 F (36.6 C)] 97.6 F (36.4 C) (06/21 0750) Pulse Rate:  [68-100] 95 (06/21 1030) Resp:  [13-22] 18 (06/21 0750) BP: (77-134)/(45-79) 81/52 (06/21 1030) SpO2:  [93 %-100 %] 93 % (06/21 0750) Weight:  [151 lb 14.4 oz (68.9 kg)-154 lb 8.7 oz (70.1 kg)] 153 lb 14.1 oz (69.8 kg) (06/21 0750)  Weight change:  Filed Weights   02/11/18 1829 02/11/18 2026 02/12/18 0750  Weight: 154 lb 8.7 oz (70.1 kg) 151 lb 14.4 oz (68.9 kg) 153 lb 14.1 oz (69.8 kg)    Intake/Output: I/O last 3 completed shifts: In: 830 [P.O.:480; IV Piggyback:350] Out: 3000 [Other:3000]   Intake/Output this shift:  No intake/output data recorded.  + jaundice  CVS- RRR RS- CTA ABD- BS present soft non-distended EXT-   L AVG      Basic Metabolic Panel: Recent Labs  Lab 02/11/18 1037 02/12/18 0650  NA 136 133*  K 3.1* 3.3*  CL 97* 95*  CO2 26 26  GLUCOSE 163* 355*  BUN 19 28*  CREATININE 5.31* 6.15*  CALCIUM 8.6* 8.2*    Liver Function Tests: Recent Labs  Lab 02/11/18 1037 02/12/18 0650 02/12/18 1018  AST 32 27  --   ALT 17 15*  --   ALKPHOS 238* 223*  --   BILITOT 2.1* QUANTITY NOT SUFFICIENT, UNABLE TO PERFORM TEST 1.9*  PROT 6.5 5.7*  --   ALBUMIN 2.3* 2.1*  --    Recent Labs  Lab 02/11/18 1037  LIPASE 18   Recent Labs  Lab 02/11/18 1037  AMMONIA 100*    CBC: Recent Labs  Lab 02/11/18 1037 02/12/18 0650  WBC 7.8 5.7  NEUTROABS 5.4  --   HGB 13.8 14.3  HCT 44.7 44.6  MCV 95.1 91.2  PLT 70* 66*    Cardiac Enzymes: No results for input(s):  CKTOTAL, CKMB, CKMBINDEX, TROPONINI in the last 168 hours.  BNP: Invalid input(s): POCBNP  CBG: No results for input(s): GLUCAP in the last 168 hours.  Microbiology: Results for orders placed or performed during the hospital encounter of 02/11/18  Gram stain     Status: None   Collection Time: 02/11/18  2:39 PM  Result Value Ref Range Status   Specimen Description FLUID PERITONEAL  Final   Special Requests NONE  Final   Gram Stain   Final    CYTOSPIN SMEAR WBC PRESENT, PREDOMINANTLY MONONUCLEAR NO ORGANISMS SEEN Performed at [facility], [address] [place], [place] [address]    Report Status 02/11/2018 FINAL  Final  MRSA PCR Screening     Status: None   Collection Time: 02/11/18  6:40 PM  Result Value Ref Range Status   MRSA by PCR NEGATIVE NEGATIVE Final    Comment:        The GeneXpert MRSA Assay (FDA approved for NASAL specimens only), is one component of a comprehensive MRSA colonization surveillance program. It is not intended to diagnose MRSA infection nor to guide or monitor treatment for MRSA infections. Performed at [facility], [address] [place], [place] [address]     Coagulation Studies: Recent Labs    02/11/18 1037 02/12/18 0650  LABPROT 15.4* 16.0*  INR 1.23 1.29    Urinalysis: No results for input(s): COLORURINE, LABSPEC, PHURINE, GLUCOSEU, HGBUR, BILIRUBINUR, KETONESUR, PROTEINUR, UROBILINOGEN, NITRITE, LEUKOCYTESUR in the last 72 hours.  Invalid input(s): APPERANCEUR    Imaging: Dg Chest Port 1 View  Result Date: 02/11/2018 CLINICAL DATA:  Shortness of breath EXAM: PORTABLE CHEST 1 VIEW COMPARISON:  November 22, 2017 FINDINGS: There is patchy consolidation in both lung bases with small pleural effusions bilaterally. Lungs elsewhere are clear. Heart is mildly enlarged with pulmonary vascularity normal. No adenopathy. Bones are osteoporotic. Postoperative change in the sternal region noted. IMPRESSION: Patchy bibasilar  consolidation consistent with bibasilar pneumonia. Small pleural effusions bilaterally. Stable cardiac prominence. No evident adenopathy. Electronically Signed   By: Bretta BangWilliam  Woodruff III M.D.   On: 02/11/2018 10:55     Medications:    . feeding supplement (PRO-STAT SUGAR FREE 64)  30 mL Oral BID  . fentaNYL  25 mcg Transdermal Q72H  . ferric citrate  420 mg Oral TID WC  . insulin aspart  0-5 Units Subcutaneous QHS  . insulin aspart  0-9 Units Subcutaneous TID WC  . insulin aspart  5 Units Subcutaneous TID WC  . insulin glargine  20 Units Subcutaneous QHS  . lactulose  10 g Oral BID  . lidocaine-EPINEPHrine  10 mL Intradermal Once  . midodrine      . midodrine  15 mg Oral TID WC  . pantoprazole  40 mg Oral Daily  . rifaximin  550 mg Oral BID   guaiFENesin, ondansetron **OR** ondansetron (ZOFRAN) IV, oxyCODONE  Assessment/ Plan:  [facility], 400/800, EDW 72kg, 2K/2.25Ca, AVG, heparin 2300 bolus - No ESA or VDRA  Assessment/Plan: 1.  Dyspnea    much improved s/p paracentesis,  Now receiving dialysis it appears that the antibiotics were stopped  2.  ESRD: Continue HD per MWF schedule. No heparin while here. Seen during dialysis treatment 3.  Hypertension/volume: Chronic low sided BP, on midodrine 10mg  TID as outpt. UF as tolerated with HD. 4.  Anemia: Hgb > 13. No ESA needed. 5.  Metabolic bone disease: Corr Ca ok, Phos pending. Continue binders Gean Quint(Auryxia). 6.  Nutrition: Alb low d/t liver disease, adding supplements. 7. Cirrhosis (as above): S/p 3L therapeutic paracentesis. 8. Type 2 DM: Per primary.  Patient is stable from renal standpoint to be discharged     LOS: 1 Kalea Perine W @TODAY @11 :21 AM

## 2018-02-12 NOTE — Discharge Summary (Signed)
Name: [name] MRN: [id number] DOB: 1960/09/21 57 y.o. PCP: Dettinger, Elige Radon, MD  Date of Admission: 02/11/2018  9:57 AM Date of Discharge: 02/12/2018 Attending Physician: Earl Lagos, MD  Discharge Diagnosis:  1. Decompensated Cirrhosis 2. Ascites 3. Acute respiratory failure with hypoxia  Discharge Medications: Allergies as of 02/12/2018      Reactions   Ketorolac Tromethamine Hives   Temsirolimus Other (See Comments)   Unknown - Pt does not recognize this medication [facility]ORIS[facility] Drug      Medication List    TAKE these medications   Abdominal Pad 8"X10" Pads 1 each by Does not apply route as needed. Use as needed   KERLIX BANDAGE ROLL Misc Use as needed   albuterol 108 (90 Base) MCG/ACT inhaler Commonly known as:  PROVENTIL HFA;VENTOLIN HFA Inhale 2 puffs into the lungs every 6 (six) hours as needed for wheezing or shortness of breath.   calcium carbonate (dosed in mg elemental calcium) 1250 MG/5ML Susp Take 5 mLs (500 mg of elemental calcium total) by mouth every 6 (six) hours as needed for indigestion.   fentaNYL 25 MCG/HR patch Commonly known as:  DURAGESIC - dosed mcg/hr Place 25 mcg onto the skin every 3 (three) days.   guaiFENesin 600 MG 12 hr tablet Commonly known as:  MUCINEX Take 2 tablets (1,200 mg total) by mouth 2 (two) times daily. What changed:    how much to take  when to take this  reasons to take this   insulin aspart 100 UNIT/ML FlexPen Commonly known as:  NOVOLOG FLEXPEN INJECT 7 TO 15 UNITS subcu up to 4 times daily What changed:    how much to take  how to take this  when to take this  additional instructions   Insulin Glargine 100 UNIT/ML Solostar Pen Commonly known as:  LANTUS SOLOSTAR Inject 26 Units into the skin daily. What changed:  how much to take   lactulose 10 GM/15ML solution Commonly known as:  CHRONULAC Take 15 mLs (10 g total) by mouth 2 (two) times daily.   midodrine 10 MG  tablet Commonly known as:  PROAMATINE Take 1 tablet (10 mg total) by mouth 3 (three) times daily with meals. What changed:  how much to take   oxyCODONE 5 MG immediate release tablet Commonly known as:  Oxy IR/ROXICODONE Take 1 tablet (5 mg total) by mouth every 4 (four) hours as needed for up to 5 days for severe pain (dyspnea).   pantoprazole 40 MG tablet Commonly known as:  PROTONIX Take 1 tablet (40 mg total) by mouth daily.   rifaximin 550 MG Tabs tablet Commonly known as:  XIFAXAN Take 1 tablet (550 mg total) by mouth 2 (two) times daily.   VASELINE PETROLATUM GAUZE Pads Use as needed       Disposition and follow-up:   Mr.[name] was discharged from [facility] in [place] condition.  At the hospital follow up visit please address:  Decompensated Cirrhosis  Acute Hypoxic Respiratory Failure Ascites - Ensure patient follow up with GI - Ensure progress with arranging regular outpatient paracentesis - Evaluate for recurrence of dyspnea from large ascites  2.  Labs / imaging needed at time of follow-up: None  3.  Pending labs/ test needing follow-up: None  Follow-up Appointments:   Hospital Course by problem list:  Decompensated Cirrhosis  Acute Hypoxic Respiratory Failure Ascites: Patient presented with 2 weeks of progressively worsening dyspnea in the setting of worsening ascites and volume overload  due to Cirrhosis and ESRD on HD. His CXR did show bilateral opacities however this was suspected to be due to  vascular congestion in the setting of volume overload as opposed to CAP (given patient had been afebrile without leukocytosis) Patient did receive doses of Ceftriaxone and Azithromycin in the ED. He also received therapeutic paracentesis in the ED yielding 3L with improvement of symptoms. A repeat CXR following HD on the day of discharge showed opacities had improved with paracentesis and dialysis and was no longer suspicious for pneumonia.  Patient is to have regular paracentesis arranged outpatient.  Discharge Vitals:   BP 101/74 (BP Location: Right Arm)   Pulse 86   Temp 97.8 F (36.6 C) (Oral)   Resp 20   Ht 5\' 8"  (1.727 m)   Wt 151 lb 14.4 oz (68.9 kg)   SpO2 95%   BMI 23.10 kg/m   Pertinent Labs, Studies, and Procedures:  CBC Latest Ref Rng & Units 02/12/2018 02/11/2018 12/25/2017  WBC 4.0 - 10.5 K/uL 5.7 7.8 8.6  Hemoglobin 13.0 - 17.0 g/dL 96.0 45.4 10.4(L)  Hematocrit 39.0 - 52.0 % 44.6 44.7 34.0(L)  Platelets 150 - 400 K/uL 66(L) 70(L) 33(L)   CMP Latest Ref Rng & Units 02/12/2018 02/12/2018 02/11/2018  Glucose 65 - 99 mg/dL - 098(J) 191(Y)  BUN 6 - 20 mg/dL - 78(G) 19  Creatinine 0.61 - 1.24 mg/dL - 9.56(O) 1.30(Q)  Sodium 135 - 145 mmol/L - 133(L) 136  Potassium 3.5 - 5.1 mmol/L - 3.3(L) 3.1(L)  Chloride 101 - 111 mmol/L - 95(L) 97(L)  CO2 22 - 32 mmol/L - 26 26  Calcium 8.9 - 10.3 mg/dL - 8.2(L) 8.6(L)  Total Protein 6.5 - 8.1 g/dL - 5.7(L) 6.5  Total Bilirubin 0.3 - 1.2 mg/dL 6.5(H) QUANTITY NOT SUFFICIENT, UNABLE TO PERFORM TEST 2.1(H)  Alkaline Phos 38 - 126 U/L - 223(H) 238(H)  AST 15 - 41 U/L - 27 32  ALT 17 - 63 U/L - 15(L) 17   Serum LDH: 154  Ascitic Fluid Labs: LDH 47, Glu 143, Protein <30, Albumin <1.0, Cell Count Mono-Macro-Serous% 19 (L) , Gram stain No organisms  Admission EKG  EKG Interpretation  Date/Time:  Thursday February 11 2018 10:52:02 EDT Ventricular Rate:  82 PR Interval:    QRS Duration: 95 QT Interval:  419 QTC Calculation: 490 R Axis:   -8 Text Interpretation:  Sinus rhythm Probable lateral infarct, old nonspecific t wave inversions new from prior 2/19 Confirmed by Meridee Score [phone]) on 02/11/2018 11:02:11 AM Also confirmed by Meridee Score [phone]), editor Lodema Hong, Tamera Punt ([id number])  on 02/11/2018 2:30:52 PM      CXR: IMPRESSION: Patchy bibasilar consolidation consistent with bibasilar pneumonia. Small pleural effusions bilaterally. Stable cardiac prominence.  No evident adenopathy.  Follow Up CXR: IMPRESSION: - Low lung volumes with bibasilar atelectasis. - Enlargement of cardiac silhouette.  Discharge Instructions: Discharge Instructions    Call MD for:  difficulty breathing, headache or visual disturbances   Complete by:  As directed    Call MD for:  persistant dizziness or light-headedness   Complete by:  As directed    Call MD for:  persistant nausea and vomiting   Complete by:  As directed    Call MD for:  redness, tenderness, or signs of infection (pain, swelling, redness, odor or green/yellow discharge around incision site)   Complete by:  As directed    Call MD for:  temperature >100.4   Complete by:  As directed  Diet - low sodium heart healthy   Complete by:  As directed    Discharge instructions   Complete by:  As directed    Thank you for allowing use to care for you  Your symptoms were due to fluid build up in your abdomen - You had about 3L removed using a paracentesis   Please follow up with your GI doctor and PCP in the next week   Face-to-face encounter (required for Medicare/Medicaid patients)   Complete by:  As directed    I Beola CordAlexander Amir Glaus certify that this patient is under my care and that I, or a nurse practitioner or physician's assistant working with me, had a face-to-face encounter that meets the physician face-to-face encounter requirements with this patient on 02/12/2018. The encounter with the patient was in whole, or in part for the following medical condition(s) which is the primary reason for home health care (List medical condition): Cirrhosis, ESRD   The encounter with the patient was in whole, or in part, for the following medical condition, which is the primary reason for home health care:  Cirrhosis   I certify that, based on my findings, the following services are medically necessary home health services:  Nursing   Reason for Medically Necessary Home Health Services:  Skilled Nursing- Skilled  Assessment/Observation   My clinical findings support the need for the above services:  Shortness of breath with activity   Further, I certify that my clinical findings support that this patient is homebound due to:  Pain interferes with ambulation/mobility   Home Health   Complete by:  As directed    Patient will need to continue home heath through [facility]   To provide the following care/treatments:  RN   Increase activity slowly   Complete by:  As directed       Signed: Beola CordMelvin, Courtni Balash, MD 02/12/2018, 5:25 PM   Pager: [phone]

## 2018-02-12 NOTE — Progress Notes (Signed)
Date: 02/12/2018  Patient name: [name]  Medical record number: [id number]  Date of birth: 03/13/1961   I have seen and evaluated [name] and discussed their care with the Residency Team.  In brief, patient is a 57 year old male with a past medical history of cirrhosis, ESRD on hemodialysis, chronic back pain, hypertension, diabetes, GERD who presented with progressive dyspnea and abdominal distention over the last 2 weeks.  Patient has a history of cirrhosis [facility].  Patient states that over the last 2 weeks he has noted progressive abdominal distention and lower extremity swelling as well as associated shortness of breath and dyspnea on exertion.  He is planning to have a paracentesis performed as an outpatient but was unable to get paper signed in time by his nephrologist.  He came to the ED for a paracentesis as he was unable to get this as an outpatient.  Patient also has a history of chronic hypotension on midodrine which has made him difficult to remove fluid off of on hemodialysis.  No chest pain, no palpitations, no lightheadedness, syncope, no focal weakness, no diaphoresis, no headache, no blurry vision, no fevers or chills, no nausea or vomiting, no diarrhea, no abdominal pain.  Patient does complain of a mild productive cough with clear phlegm but no hemoptysis.  Today patient states that his shortness of breath is much improved and denies any other complaints at this time.  PMHx, Fam Hx, and/or Soc Hx : As per resident admit note  Vitals:   02/12/18 1000 02/12/18 1030  BP: (!) 77/48 (!) 81/52  Pulse: 96 95  Resp:    Temp:    SpO2:     General: Awake, alert, oriented x3, NAD CVS: Regular rate and rhythm, normal heart sounds Lungs: Bibasilar crackles noted Abdomen: Soft, distended, nontender, dressing intact at paracentesis site, normoactive bowel sounds Extremities: 2+ bilateral lower extremity  pitting edema noted  Assessment and Plan: I have seen and evaluated the patient as outlined above. I agree with the formulated Assessment and Plan as detailed in the residents' note, with the following changes:   1.  Worsening shortness of breath likely secondary to fluid overload: -Patient presented to the ED with progressively worsening shortness of breath and dyspnea on exertion in the setting of progressive abdominal distention secondary to ascites and end-stage renal disease on hemodialysis.  His chest x-ray did show bilateral opacities suggestive of possible pneumonia.  However, he has had no fevers, no leukocytosis and only minimal cough with clear sputum production which would not be consistent with a pneumonia.  I suspect that his symptoms are likely secondary to fluid overload rather than pneumonia. -Patient did receive 1 dose of antibiotics in the ED of ceftriaxone and azithromycin.  These have been DC'd since then -Would not continue antibiotics for now -Would repeat chest x-ray today after hemodialysis to see if the infiltrates have improved -Patient is status post paracentesis with removal of 3 L of ascitic fluid.  No organisms seen on Gram stain and culture with no growth to date. -Ascitic fluid has predominant lymphs and no evidence of SBP -Continue with home rifaximin and lactulose for his cirrhosis -No further work-up at this time -Patient may need repeat paracentesis at regular intervals as an outpatient.  We [facility] -Possible discharge home today if he continues to improve   Earl LagosNarendra, Madolin Twaddle, MD 6/21/201911:20  AM

## 2018-02-12 NOTE — Progress Notes (Signed)
Subjective: Patient seen in HD this AM he states he is feeling better and his breathing remains improved following his paracentesis yesterday. He states his paracentesis cyte has stopped draining with current dressing. His RLE wound is also draining less and he states he has been receiving would care. We discussed possible discharge today provided he tolerated dialysis well. We informed him he would receive a repeat chest xray to evaluate for improvement following paracentesis and dialysis. He was informed that we would like for [facility] to [facility]. Patient has no further questions or concerns.  Objective:  Vital signs in last 24 hours: Vitals:   02/11/18 1721 02/11/18 1829 02/11/18 2026 02/12/18 0506  BP: 101/70  (!) 93/59 102/71  Pulse: 79  83 71  Resp: 18  15 18   Temp: 97.8 F (36.6 C)  97.8 F (36.6 C) (!) 97.4 F (36.3 C)  TempSrc: Oral  Oral Oral  SpO2: 99%  93% 93%  Weight:  154 lb 8.7 oz (70.1 kg) 151 lb 14.4 oz (68.9 kg)   Height:       Physical Exam  Constitutional: He is oriented to person, place, and time. He appears well-developed. No distress.  Moderately mal-nourished  HENT:  Head: Normocephalic and atraumatic.  Eyes: EOM are normal.  Mild icterus  Cardiovascular: Normal rate, regular rhythm, normal heart sounds and intact distal pulses.  Pulmonary/Chest: Effort normal and breath sounds normal. No respiratory distress.  Abdominal: Soft. Bowel sounds are normal. He exhibits distension. There is no tenderness.  Paracentesis site covered with clean bandage with out drainage  Musculoskeletal: He exhibits edema. He exhibits no deformity.  Tender (chronic neuropathy) RLE wound covered with clean bandage  Neurological: He is alert and oriented to person, place, and time.  Skin: Skin is warm and dry.  Stasis changes   Assessment/Plan: [age] yo M with Hx of Cirrhosis (Transplant workup on going); ESRD (On HD, transplant workup  ongoing); Chronic back pain; HTN; DM; GERD who presents with dyspnea and abdominal swelling.  Decompensated Cirrhosis  Acute Hypoxic Respiratory Failure  ? Pneumonia: Patient presented with 2 weeks of progressively worsening dyspnea in the setting of worsening ascites and volume overload due to Cirrhosis and ESRD on HD as below. His CXR did show bilateral opacities however this may be vascular congestion in the setting of volume overload as opposed to CAP (given patient is afebrile without leukocytosis) Patient started on Ceftriaxone and Azithromycin in the ED, which was not continued. > 3L paracentesis in ED, with improvement of sympotoms > Oxygenating well on RA this morning.  - Body fluid LDH 47, Glu 143, Protein <30, Albumin <1.0, Cell Count Mono-Macro-Serous% 19 (L) , Gram stain No prgansims, > Body fluid Culture: Pending - Serum LDH - Continue home Rifaximin and Lactulose - Repeat CXR after HD - AM CMP and CBC if patient is not discahrged  ESRD: (On HD MWF). Fistula placed 12/10/2017. Dialysis has been challenging for him due to low blood pressure requiring midorine (prescription stats 10mg  TID, but patient reports 15mg  TID).  > Nephrology consulted for HD tomorrow  - Midodrine 15mg  TID - AM CMP  Hypotension/HTN: Patient has a history of HTN previously on HCTZ. He has been on midodrine for hypotension with HD - Midodrine 15mg  TID  DM: On Lantus 26U Daily and Novolog 9-17U QID - Lantus 15U - Novolog 5U TID - SSI-S + qhs  GERD: Continue home protonix Chronic back pain: Continue home Fentanyl  patch (to be changed in the AM) and Oxycondone 5mg  q4h PRN  FEN: Renal, fluid restriction VTE ppx: None (plt<100) Code Status: Partial: Pressors and BiPAP only   Dispo: Anticipated discharge in approximately 0-1 day(s).   Beola Cord, MD 02/12/2018, 8:07 AM Pager: [phone]

## 2018-02-12 NOTE — Consult Note (Signed)
   [facility]   02/12/2018  Faythe Gheearon P Kunesh 01/19/1961 [id number]  Patient screened for high risk score for unplanned readmissions in [facility] Care Management services. Patient is in the [facility] of the [facility] under [facility]'s [facility] plan. Came by to speak with patient and just started therapy session.  Will follow up as appropriate.  Please place a [facility] Care [facility] consult or for questions contact:   Charlesetta ShanksVictoria Zykeriah Mathia, RN BSN CCM [facility] Liaison  [phone] business mobile phone Toll free office [phone]

## 2018-02-12 NOTE — Care Management Obs Status (Signed)
MEDICARE OBSERVATION STATUS NOTIFICATION   Patient Details  Name: [name] MRN: [id number] Date of Birth: 03/10/1961   Medicare Observation Status Notification Given:  Waylan BogaYes    Danijela Vessey, RN 02/12/2018, 6:26 PM

## 2018-02-12 NOTE — Progress Notes (Signed)
[facility]  Patient Status: Active (receiving services up to time of hospitalization)  [facility] is providing the following services: RN  If patient discharges after hours, please call [phone].   Kizzie FurnishDonna Fellmy 02/12/2018, 10:31 AM

## 2018-02-12 NOTE — Evaluation (Signed)
Physical Therapy Evaluation Patient Details Name: [name] MRN: [id number] DOB: [birth year] Today's Date: 02/12/2018   History of Present Illness  Patient is a 57 y.o. M with history of cirrhosis (transplant workup ongoing), ESRD (on HD, transplant workup ongoing), chronic back pain, HTN, DM, GERD who presents with dyspnea and abdominal swelling.   Clinical Impression  Pt admitted with above diagnosis. Pt currently with functional limitations due to the deficits listed below (see PT Problem List). Patient presenting close to baseline. Able to participate fully in therapy after HD session. Ambulating 120 feet with RW and supervision. No PT follow up recommended; reviewed exercise recommendations and patient verbalized understanding of significance. Pt will benefit from skilled PT to increase their independence and safety with mobility.     Follow Up Recommendations No PT follow up;Supervision for mobility/OOB    Equipment Recommendations  None recommended by PT    Recommendations for Other Services       Precautions / Restrictions Precautions Precautions: Fall Restrictions Weight Bearing Restrictions: No      Mobility  Bed Mobility Overal bed mobility: Modified Independent             General bed mobility comments: increased time and effort  Transfers Overall transfer level: Modified independent Equipment used: Rolling walker (2 wheeled)             General transfer comment: increased time  Ambulation/Gait Ambulation/Gait assistance: Supervision Gait Distance (Feet): 120 Feet Assistive device: Rolling walker (2 wheeled) Gait Pattern/deviations: Step-through pattern;Trunk flexed     General Gait Details: poor proximity to RW and cueing for upright posture; patient states he can stand taller with his Investment banker, corporate    Modified Rankin (Stroke Patients Only)       Balance Overall balance assessment: Mild  deficits observed, not formally tested                                           Pertinent Vitals/Pain Pain Assessment: Faces Faces Pain Scale: Hurts even more Pain Location: bilateral feet with touching due to peripheral neuropathy Pain Descriptors / Indicators: Burning Pain Intervention(s): Monitored during session    Home Living Family/patient expects to be discharged to:: Private residence Living Arrangements: Spouse/significant other Available Help at Discharge: Family Type of Home: House Home Access: Stairs to enter Entrance Stairs-Rails: Right Entrance Stairs-Number of Steps: 2 Home Layout: One level Home Equipment: Environmental consultant - 2 wheels;Cane - single point;Wheelchair - manual      Prior Function Level of Independence: Needs assistance   Gait / Transfers Assistance Needed: amb with Rollator  ADL's / Homemaking Assistance Needed: sponge bathes with wife        Hand Dominance   Dominant Hand: Right    Extremity/Trunk Assessment   Upper Extremity Assessment Upper Extremity Assessment: Generalized weakness(BUE atrophy)    Lower Extremity Assessment Lower Extremity Assessment: Generalized weakness       Communication   Communication: No difficulties  Cognition Arousal/Alertness: Awake/alert Behavior During Therapy: WFL for tasks assessed/performed Overall Cognitive Status: Impaired/Different from baseline Area of Impairment: Memory                     Memory: Decreased short-term memory                General Comments  General comments (skin integrity, edema, etc.): Bandaged wound on RLE. Instructed patient on activity recommendations to maintain mobility/increase strength prior to potential transplant.    Exercises     Assessment/Plan    PT Assessment Patient needs continued PT services  PT Problem List Decreased strength;Decreased activity tolerance;Decreased balance;Decreased mobility;Decreased cognition       PT  Treatment Interventions Gait training;Stair training;Functional mobility training;Therapeutic activities;Therapeutic exercise;Balance training;Patient/family education    PT Goals (Current goals can be found in the Care Plan section)  Acute Rehab PT Goals Patient Stated Goal: go home PT Goal Formulation: With patient Time For Goal Achievement: 02/26/18 Potential to Achieve Goals: Fair    Frequency Min 3X/week   Barriers to discharge        Co-evaluation               AM-PAC PT "6 Clicks" Daily Activity  Outcome Measure Difficulty turning over in bed (including adjusting bedclothes, sheets and blankets)?: None Difficulty moving from lying on back to sitting on the side of the bed? : A Little Difficulty sitting down on and standing up from a chair with arms (e.g., wheelchair, bedside commode, etc,.)?: A Little Help needed moving to and from a bed to chair (including a wheelchair)?: A Little Help needed walking in hospital room?: A Little Help needed climbing 3-5 steps with a railing? : A Little 6 Click Score: 19    End of Session Equipment Utilized During Treatment: Gait belt Activity Tolerance: Patient tolerated treatment well Patient left: in bed;with call bell/phone within reach;with family/visitor present Nurse Communication: Mobility status PT Visit Diagnosis: Unsteadiness on feet (R26.81);Muscle weakness (generalized) (M62.81)    Time: [id number] PT Time Calculation (min) (ACUTE ONLY): 34 min   Charges:   PT Evaluation $PT Eval Moderate Complexity: 1 Mod PT Treatments $Gait Training: 8-22 mins   PT G Codes:       Laurina Bustlearoline Royalty Domagala, PT, DPT Acute Rehabilitation Services  Pager: [phone]   Vanetta MuldersCarloine H Darthula Desa 02/12/2018, 5:20 PM

## 2018-02-13 LAB — HEPATITIS B SURFACE ANTIBODY,QUALITATIVE: HEP B S AB: NONREACTIVE

## 2018-02-15 DIAGNOSIS — M545 Low back pain: Secondary | ICD-10-CM | POA: Diagnosis not present

## 2018-02-15 DIAGNOSIS — S81802D Unspecified open wound, left lower leg, subsequent encounter: Secondary | ICD-10-CM | POA: Diagnosis not present

## 2018-02-15 DIAGNOSIS — N186 End stage renal disease: Secondary | ICD-10-CM | POA: Diagnosis not present

## 2018-02-15 DIAGNOSIS — F1010 Alcohol abuse, uncomplicated: Secondary | ICD-10-CM | POA: Diagnosis not present

## 2018-02-15 DIAGNOSIS — K7030 Alcoholic cirrhosis of liver without ascites: Secondary | ICD-10-CM | POA: Diagnosis not present

## 2018-02-15 DIAGNOSIS — E1165 Type 2 diabetes mellitus with hyperglycemia: Secondary | ICD-10-CM | POA: Diagnosis not present

## 2018-02-15 DIAGNOSIS — I2510 Atherosclerotic heart disease of native coronary artery without angina pectoris: Secondary | ICD-10-CM | POA: Diagnosis not present

## 2018-02-15 DIAGNOSIS — K219 Gastro-esophageal reflux disease without esophagitis: Secondary | ICD-10-CM | POA: Diagnosis not present

## 2018-02-15 DIAGNOSIS — E1122 Type 2 diabetes mellitus with diabetic chronic kidney disease: Secondary | ICD-10-CM | POA: Diagnosis not present

## 2018-02-16 LAB — CULTURE, BLOOD (ROUTINE X 2)
CULTURE: NO GROWTH
Culture: NO GROWTH
SPECIAL REQUESTS: ADEQUATE

## 2018-02-16 LAB — CULTURE, BODY FLUID W GRAM STAIN -BOTTLE

## 2018-02-16 LAB — CULTURE, BODY FLUID-BOTTLE: CULTURE: NO GROWTH

## 2018-02-17 ENCOUNTER — Encounter: Payer: Self-pay | Admitting: Family Medicine

## 2018-02-17 ENCOUNTER — Ambulatory Visit (INDEPENDENT_AMBULATORY_CARE_PROVIDER_SITE_OTHER): Payer: Medicare HMO | Admitting: Family Medicine

## 2018-02-17 ENCOUNTER — Other Ambulatory Visit: Payer: Self-pay | Admitting: Family Medicine

## 2018-02-17 ENCOUNTER — Ambulatory Visit (HOSPITAL_COMMUNITY)
Admission: RE | Admit: 2018-02-17 | Discharge: 2018-02-17 | Disposition: A | Discharge status: Home / Self Care | Payer: Medicare HMO | Source: Ambulatory Visit | Attending: Family Medicine | Admitting: Family Medicine

## 2018-02-17 VITALS — BP 96/65 | HR 94 | Temp 97.2°F

## 2018-02-17 DIAGNOSIS — T8131XA Disruption of external operation (surgical) wound, not elsewhere classified, initial encounter: Secondary | ICD-10-CM | POA: Insufficient documentation

## 2018-02-17 DIAGNOSIS — K7031 Alcoholic cirrhosis of liver with ascites: Secondary | ICD-10-CM | POA: Insufficient documentation

## 2018-02-17 DIAGNOSIS — K7030 Alcoholic cirrhosis of liver without ascites: Secondary | ICD-10-CM | POA: Diagnosis not present

## 2018-02-17 DIAGNOSIS — K7460 Unspecified cirrhosis of liver: Secondary | ICD-10-CM | POA: Insufficient documentation

## 2018-02-17 DIAGNOSIS — Z992 Dependence on renal dialysis: Secondary | ICD-10-CM | POA: Diagnosis not present

## 2018-02-17 DIAGNOSIS — J9601 Acute respiratory failure with hypoxia: Secondary | ICD-10-CM | POA: Diagnosis not present

## 2018-02-17 DIAGNOSIS — N186 End stage renal disease: Secondary | ICD-10-CM | POA: Diagnosis not present

## 2018-02-17 NOTE — Progress Notes (Signed)
BP 96/65   Pulse 94   Temp (!) 97.2 F (36.2 C) (Oral)    Subjective:    Patient ID: [name], male    DOB: 06/06/1961, 57 y.o.   MRN: [id number]  HPI: [name] is a 57 y.o. male presenting on 02/17/2018 for Abscess (right leg & right abd area, need referral to have fluid taken off every 2 weeks instead of going to ER)   HPI Cirrhosis with ascites Patient is coming in for cirrhosis and ascites.  He was seen in the emergency department on 02/11/2018 and had 4 L removed and was treated for possible pneumonia although they thought it was more than distention of his abdomen causing the breathing issues.  He is coming in today because he needs a referral for gastroenterology outside of the [place] because he cannot get into the [place] gastroenterologist until November and he needs set up for paracentesis because he is so distended that he is feeling short of breath again.  He is also distended that he is still leaking out of the opening where they did a paracentesis last week.  He denies any fevers or chills or abdominal pain but just more feels distended and bloated and wants to get set up for this.  Patient also sees dialysis for end-stage renal disease and he is continuing to see them.  Relevant past medical, surgical, family and social history reviewed and updated as indicated. Interim medical history since our last visit reviewed. Allergies and medications reviewed and updated.  Review of Systems  Constitutional: Negative for chills and fever.  Respiratory: Negative for shortness of breath and wheezing.   Cardiovascular: Negative for chest pain and leg swelling.  Gastrointestinal: Positive for abdominal distention.  Musculoskeletal: Negative for back pain and gait problem.  Skin: Negative for rash.  All other systems reviewed and are negative.   Per HPI unless specifically indicated above   Allergies as of 02/17/2018      Reactions   Ketorolac Tromethamine Hives   Temsirolimus Other (See Comments)   Unknown - Pt does not recognize this medication [facility]      Medication List        Accurate as of 02/17/18 10:55 AM. Always use your most recent med list.          Abdominal Pad 8"X10" Pads 1 each by Does not apply route as needed. Use as needed   KERLIX BANDAGE ROLL Misc Use as needed   albuterol 108 (90 Base) MCG/ACT inhaler Commonly known as:  PROVENTIL HFA;VENTOLIN HFA Inhale 2 puffs into the lungs every 6 (six) hours as needed for wheezing or shortness of breath.   calcium carbonate (dosed in mg elemental calcium) 1250 MG/5ML Susp Take 5 mLs (500 mg of elemental calcium total) by mouth every 6 (six) hours as needed for indigestion.   fentaNYL 25 MCG/HR patch Commonly known as:  DURAGESIC - dosed mcg/hr Place 25 mcg onto the skin every 3 (three) days.   guaiFENesin 600 MG 12 hr tablet Commonly known as:  MUCINEX Take 2 tablets (1,200 mg total) by mouth 2 (two) times daily.   insulin aspart 100 UNIT/ML FlexPen Commonly known as:  NOVOLOG FLEXPEN INJECT 7 TO 15 UNITS subcu up to 4 times daily   Insulin Glargine 100 UNIT/ML Solostar Pen Commonly known as:  LANTUS SOLOSTAR Inject 26 Units into the skin daily.   lactulose 10 GM/15ML solution Commonly known as:  CHRONULAC Take 15 mLs (10 g total)  by mouth 2 (two) times daily.   midodrine 10 MG tablet Commonly known as:  PROAMATINE Take 1 tablet (10 mg total) by mouth 3 (three) times daily with meals.   oxyCODONE 5 MG immediate release tablet Commonly known as:  Oxy IR/ROXICODONE Take 1 tablet (5 mg total) by mouth every 4 (four) hours as needed for up to 5 days for severe pain (dyspnea).   pantoprazole 40 MG tablet Commonly known as:  PROTONIX Take 1 tablet (40 mg total) by mouth daily.   rifaximin 550 MG Tabs tablet Commonly known as:  XIFAXAN Take 1 tablet (550 mg total) by mouth 2 (two) times daily.   VASELINE PETROLATUM GAUZE Pads Use as needed           Objective:    BP 96/65   Pulse 94   Temp (!) 97.2 F (36.2 C) (Oral)   Wt Readings from Last 3 Encounters:  02/12/18 151 lb 14.4 oz (68.9 kg)  01/12/18 166 lb 14.4 oz (75.7 kg)  12/25/17 165 lb (74.8 kg)    Physical Exam  Constitutional: He is oriented to person, place, and time. He appears well-developed and well-nourished. No distress.  Eyes: Conjunctivae are normal. No scleral icterus.  Cardiovascular: Normal rate, regular rhythm, normal heart sounds and intact distal pulses.  No murmur heard. Pulmonary/Chest: Effort normal and breath sounds normal. No respiratory distress. He has no wheezes.  Abdominal: Soft. Bowel sounds are normal. He exhibits distension.  Musculoskeletal: Normal range of motion. He exhibits no edema.  Neurological: He is alert and oriented to person, place, and time. Coordination normal.  Skin: Skin is warm and dry. No rash noted. He is not diaphoretic.  Psychiatric: He has a normal mood and affect. His behavior is normal.  Nursing note and vitals reviewed.       Assessment & Plan:   Problem List Items Addressed This Visit      Digestive   CIRRHOSIS, ALCOHOLIC   Relevant Orders   Ambulatory referral to Gastroenterology   Cirrhosis [facility](HCC) - Primary   Relevant Orders   Ambulatory referral to Gastroenterology   Ambulatory referral to Gastroenterology     Genitourinary   ESRD (end stage renal disease) on dialysis (HCC)       Follow up plan: Return if symptoms worsen or fail to improve.  Counseling provided for all of the vaccine components Orders Placed This Encounter  Procedures  . US Paracentesis  . Ambulatory referral to Gastroenterology    Arville CareJoshua Dettinger, MD [facility] 02/17/2018, 10:55 AM

## 2018-02-18 DIAGNOSIS — N186 End stage renal disease: Secondary | ICD-10-CM | POA: Diagnosis not present

## 2018-02-19 DIAGNOSIS — M545 Low back pain: Secondary | ICD-10-CM | POA: Diagnosis not present

## 2018-02-19 DIAGNOSIS — I2510 Atherosclerotic heart disease of native coronary artery without angina pectoris: Secondary | ICD-10-CM | POA: Diagnosis not present

## 2018-02-19 DIAGNOSIS — S81801D Unspecified open wound, right lower leg, subsequent encounter: Secondary | ICD-10-CM | POA: Diagnosis not present

## 2018-02-19 DIAGNOSIS — K767 Hepatorenal syndrome: Secondary | ICD-10-CM | POA: Diagnosis not present

## 2018-02-19 DIAGNOSIS — K7030 Alcoholic cirrhosis of liver without ascites: Secondary | ICD-10-CM | POA: Diagnosis not present

## 2018-02-19 DIAGNOSIS — K219 Gastro-esophageal reflux disease without esophagitis: Secondary | ICD-10-CM | POA: Diagnosis not present

## 2018-02-19 DIAGNOSIS — N186 End stage renal disease: Secondary | ICD-10-CM | POA: Diagnosis not present

## 2018-02-19 DIAGNOSIS — F1010 Alcohol abuse, uncomplicated: Secondary | ICD-10-CM | POA: Diagnosis not present

## 2018-02-19 DIAGNOSIS — E1122 Type 2 diabetes mellitus with diabetic chronic kidney disease: Secondary | ICD-10-CM | POA: Diagnosis not present

## 2018-02-21 ENCOUNTER — Observation Stay (HOSPITAL_COMMUNITY)
Admission: EM | Admit: 2018-02-21 | Discharge: 2018-02-22 | Disposition: A | Discharge status: Home / Self Care | Payer: Medicare HMO | Attending: Internal Medicine | Admitting: Internal Medicine

## 2018-02-21 ENCOUNTER — Emergency Department (HOSPITAL_COMMUNITY): Payer: Medicare HMO

## 2018-02-21 DIAGNOSIS — D696 Thrombocytopenia, unspecified: Secondary | ICD-10-CM | POA: Diagnosis not present

## 2018-02-21 DIAGNOSIS — N170 Acute kidney failure with tubular necrosis: Secondary | ICD-10-CM | POA: Diagnosis not present

## 2018-02-21 DIAGNOSIS — F4310 Post-traumatic stress disorder, unspecified: Secondary | ICD-10-CM | POA: Insufficient documentation

## 2018-02-21 DIAGNOSIS — M810 Age-related osteoporosis without current pathological fracture: Secondary | ICD-10-CM | POA: Insufficient documentation

## 2018-02-21 DIAGNOSIS — K219 Gastro-esophageal reflux disease without esophagitis: Secondary | ICD-10-CM | POA: Insufficient documentation

## 2018-02-21 DIAGNOSIS — I120 Hypertensive chronic kidney disease with stage 5 chronic kidney disease or end stage renal disease: Secondary | ICD-10-CM | POA: Insufficient documentation

## 2018-02-21 DIAGNOSIS — D631 Anemia in chronic kidney disease: Secondary | ICD-10-CM | POA: Diagnosis not present

## 2018-02-21 DIAGNOSIS — E1165 Type 2 diabetes mellitus with hyperglycemia: Secondary | ICD-10-CM | POA: Diagnosis not present

## 2018-02-21 DIAGNOSIS — N186 End stage renal disease: Secondary | ICD-10-CM

## 2018-02-21 DIAGNOSIS — I953 Hypotension of hemodialysis: Secondary | ICD-10-CM | POA: Insufficient documentation

## 2018-02-21 DIAGNOSIS — Z992 Dependence on renal dialysis: Secondary | ICD-10-CM | POA: Diagnosis not present

## 2018-02-21 DIAGNOSIS — M545 Low back pain: Secondary | ICD-10-CM | POA: Diagnosis not present

## 2018-02-21 DIAGNOSIS — K7460 Unspecified cirrhosis of liver: Secondary | ICD-10-CM | POA: Diagnosis not present

## 2018-02-21 DIAGNOSIS — K767 Hepatorenal syndrome: Secondary | ICD-10-CM | POA: Diagnosis not present

## 2018-02-21 DIAGNOSIS — R0602 Shortness of breath: Secondary | ICD-10-CM | POA: Diagnosis not present

## 2018-02-21 DIAGNOSIS — K7290 Hepatic failure, unspecified without coma: Secondary | ICD-10-CM | POA: Diagnosis present

## 2018-02-21 DIAGNOSIS — G8929 Other chronic pain: Secondary | ICD-10-CM | POA: Diagnosis not present

## 2018-02-21 DIAGNOSIS — E559 Vitamin D deficiency, unspecified: Secondary | ICD-10-CM | POA: Insufficient documentation

## 2018-02-21 DIAGNOSIS — Z87891 Personal history of nicotine dependence: Secondary | ICD-10-CM | POA: Diagnosis not present

## 2018-02-21 DIAGNOSIS — R188 Other ascites: Secondary | ICD-10-CM | POA: Diagnosis not present

## 2018-02-21 DIAGNOSIS — E8770 Fluid overload, unspecified: Secondary | ICD-10-CM | POA: Diagnosis not present

## 2018-02-21 DIAGNOSIS — G43909 Migraine, unspecified, not intractable, without status migrainosus: Secondary | ICD-10-CM | POA: Diagnosis not present

## 2018-02-21 DIAGNOSIS — Z794 Long term (current) use of insulin: Secondary | ICD-10-CM | POA: Diagnosis not present

## 2018-02-21 DIAGNOSIS — E1122 Type 2 diabetes mellitus with diabetic chronic kidney disease: Secondary | ICD-10-CM | POA: Diagnosis not present

## 2018-02-21 DIAGNOSIS — K7031 Alcoholic cirrhosis of liver with ascites: Secondary | ICD-10-CM | POA: Diagnosis not present

## 2018-02-21 LAB — BASIC METABOLIC PANEL
ANION GAP: 13 (ref 5–15)
BUN: 32 mg/dL — ABNORMAL HIGH (ref 6–20)
CHLORIDE: 101 mmol/L (ref 98–111)
CO2: 21 mmol/L — AB (ref 22–32)
Calcium: 8.2 mg/dL — ABNORMAL LOW (ref 8.9–10.3)
Creatinine, Ser: 5.67 mg/dL — ABNORMAL HIGH (ref 0.61–1.24)
GFR calc non Af Amer: 10 mL/min — ABNORMAL LOW (ref >60)
GFR, EST AFRICAN AMERICAN: 12 mL/min — AB (ref >60)
Glucose, Bld: 250 mg/dL — ABNORMAL HIGH (ref 70–99)
POTASSIUM: 4.2 mmol/L (ref 3.5–5.1)
Sodium: 135 mmol/L (ref 135–145)

## 2018-02-21 LAB — CBC
HEMATOCRIT: 44 % (ref 39.0–52.0)
HEMOGLOBIN: 13.8 g/dL (ref 13.0–17.0)
MCH: 28.9 pg (ref 26.0–34.0)
MCHC: 31.4 g/dL (ref 30.0–36.0)
MCV: 92.1 fL (ref 78.0–100.0)
Platelets: 82 10*3/uL — ABNORMAL LOW (ref 150–400)
RBC: 4.78 MIL/uL (ref 4.22–5.81)
RDW: 17 % — ABNORMAL HIGH (ref 11.5–15.5)
WBC: 10.6 10*3/uL — AB (ref 4.0–10.5)

## 2018-02-21 LAB — HEPATIC FUNCTION PANEL
ALBUMIN: 1.9 g/dL — AB (ref 3.5–5.0)
ALT: 17 U/L (ref 0–44)
AST: 32 U/L (ref 15–41)
Alkaline Phosphatase: 244 U/L — ABNORMAL HIGH (ref 38–126)
BILIRUBIN DIRECT: 0.7 mg/dL — AB (ref 0.0–0.2)
Indirect Bilirubin: 0.9 mg/dL (ref 0.3–0.9)
TOTAL PROTEIN: 5.5 g/dL — AB (ref 6.5–8.1)
Total Bilirubin: 1.6 mg/dL — ABNORMAL HIGH (ref 0.3–1.2)

## 2018-02-21 LAB — PROTIME-INR
INR: 1.16
Prothrombin Time: 14.8 seconds (ref 11.4–15.2)

## 2018-02-21 LAB — I-STAT TROPONIN, ED: Troponin i, poc: 0.01 ng/mL (ref 0.00–0.08)

## 2018-02-21 NOTE — ED Notes (Signed)
EDP at bedside  

## 2018-02-21 NOTE — H&P (Addendum)
Date: 02/22/2018               Patient Name:  [name] MRN: [id number]  DOB: [birth year] Age / Sex: 57 y.o., male   PCP: Dettinger, Fransisca Kaufmann, MD         Medical Service: Internal Medicine Teaching Service         Attending Physician: Dr. Bartholomew Crews, MD    First Contact: Dr. Dixie Dials Pager: [phone]  Second Contact: Dr. Alphonzo Grieve Pager: [phone]       After Hours (After 5p/  First Contact Pager: [phone]  weekends / holidays): Second Contact Pager: [phone]   Chief Complaint: SOB  History of Present Illness: Mr. Massing is a 57 y.o male with ESRD on HD (MWF), Decompensated Cirrhosis, HTN, and DM who presented to the ED progressive SOB and abdominal distention of 10 days duration. The patient was hospitalized on 6/21 for volume overload and decompensated cirrhosis with ascites. Therapeutic paracentesis was performed with 3L removed and his symptoms abated. Since discharge he has had progressive abdominal distention and SOB. He could tell that the ascites was building back up. He has been trying to arrange outpatient an paracentesis schedule with his GI doctor but has been unsuccessful.  Is compliant with all his medication except the Lactulose because he does not want to use the restroom all this time. He has 2 soft BMs per day. He denies headaches, visual changes, cough, CP, N/V, diarrhea, constipation. He does have some new blisters on his left LE and abdomen. Theses skin changes are painful but not itchy.  He is fairly new to ESRD and started it in March of 2019. Unfortunately he is anuric and hypotension limits the amount of fluid that can be withdrawal during sessions. He is on Midodrine to try to keep his BP up. His ESRD is felt to be secondary to hepatorenal syndrome and NSAID use. He is working towards a liver and renal transplant at [facility] and it reluctant to be admitted tonight. He has a transplant appointment on Tuesday, 7/2 and is requesting to be discharged  tomorrow morning after HD. Wife at bedside states that she feels the patient's mentation is normal and does not other questions or concerns.   In the ED the patient was found to be hypoxic (82% on RA) but otherwise hemodynamically stable. Initial labs were significant for hyperglycemia, non-anion gap metabolic acidosis, a slight leukocytosis  Meds:  Albuterol  Calcium carbonate  Fentanyl 25 mcg/hr  Novolog 5-7 units TID  Lantus 35 units QHS Lactulose 15 ml BID (Not taking) Midodrine 10 mg TID  Pantoprazole 40 mg QD Rifaximin 550 mg BID  Allergies: Allergies as of 02/21/2018 - Review Complete 02/21/2018  Allergen Reaction Noted  . Ketorolac tromethamine Hives 03/04/2011  . Temsirolimus Other (See Comments) 03/20/2016   Past Medical History:  Diagnosis Date  . Chronic lower back pain   . Compression fracture    lumbar 3  . Diabetes mellitus   . Fracture acetabulum-closed ([place]) 04/09/2013  . GERD (gastroesophageal reflux disease)   . H/O Legionnaire's disease   . Hepatomegaly   . Migraine   . Osteoporosis   . Pancreatitis   . PTSD (post-traumatic stress disorder)   . 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  Review of Systems: A complete ROS was negative except as per HPI.   Physical Exam: Blood pressure 109/67, pulse 90, temperature 98.1 F (36.7 C), temperature source Oral, resp. rate 17, SpO2 95 %.  General: Elderly male in no acute distress HENT: Normocephalic, atraumatic, moist mucus membranes Pulm: Diminished air movement with diffuse rhonchi   CV: RRR, no murmurs, no rubs  Abdomen: Active bowel sounds, soft, distended with minimal fluid wave, no tenderness to palpation  Extremities: LEs cool to the touch, UEs warm, severe pitting edema to the  chest  Skin: Warm and dry, hemorrhagic blisters on the left medial thigh and knee Neuro: Alert and oriented x 3  EKG: personally reviewed: my interpretation is regular sinus rhythm, normal axis and intervals, pathologic Q waves in leads III and aVF, no ST elevation or new T wave inversion. Different axis and low voltage compared to previous EKG  CXR: personally reviewed: my interpretation is poor inspiratory volumes with hardware in the sternum. No other bony or soft tissue abnormalities. Increased pulmonary congestion. Overall appears similar to CXR on 02/12/2018.  Assessment & Plan by Problem: Active Problems:   Decompensated hepatic cirrhosis [facility])  Mr. Hawe is a 57 y.o male with ESRD on HD (MWF), Decompensated Cirrhosis, HTN, and DM who presented to the ED progressive SOB and abdominal distention secondary to decompensated cirrhosis with ascites and volume overload.  Volume Overload.  Decompensated Cirrhosis with Ascites. Patient with known decompensated cirrhosis and recurrent ascites. Hospitalized on 6/20 for similar presentation. Patient with severe anasarca on PE. He is s/p paracentesis with approximately 4L removed and feeling significantly better. His volume overload is secondary to decompensated cirrhosis and ESRD. No encephalopathic on PE. He is working towards transplant at [facility] and has an appointment on 7/2 and is requesting to be discharged tomorrow.  - MELD score 25  - Thrombocytopenia stable  - Alk phos chronically elevated likely due to ESRD - Continue Rifaximin 550 mg BID - Follow-up ascites analysis  - Encourage patient to use Lactulose at home, this would also help with volume status.  ESRD on HD secondary to hepatorenal syndrome. Started HD in March 2019. Currently working towards transplant at [facility]. Unable to have much fluid pulled off due to chronic hypotension. Is on midodrine.  - No urgent indications for HD  - Consult Nephrology in the AM  Hemorrhagic blisters  LLE. Patient with painful blisters on the LLE. They do not follow a single dermatomal pattern. Patient is at risk for infection given immunocompromised status. Hepatitis C negative. Would monitor and if continue to progress consider starting valacyclovir.   Chronic Hypotension  - Continue midodrine 10 mg TID  DM. Home regimen includes Lantus 35 units QHS and 5-7 units Novolog TID.  - Lantus 20 units QD  - Novolog SSI TID  Diet: Renal / carb modified  VTE ppx: SubQ Heparin  CODE STATUS: Full code  Dispo: Admit patient to Observation with expected length of stay less than 2 midnights.  Signed: Ina Homes, MD 02/22/2018, 12:41 AM  My Pager: [phone]

## 2018-02-21 NOTE — ED Notes (Signed)
Called main lab added ; Protime- INR, and Hepatic panel to blood in main lab.

## 2018-02-21 NOTE — ED Triage Notes (Signed)
Patient to ED with SOB and fluid overload - is dialysis patient (MWF) with large amount of abdominal swelling over the past few days. Patient had recent paracentesis 2-3 weeks ago. Patient 82% RA, breathing labored - placed on 4L O2 increasing SpO2 to 92%. Patient endorsing chest pain and large amount of scrotal swelling as well.

## 2018-02-22 ENCOUNTER — Telehealth: Payer: Self-pay

## 2018-02-22 ENCOUNTER — Encounter (HOSPITAL_COMMUNITY): Payer: Self-pay | Admitting: Internal Medicine

## 2018-02-22 ENCOUNTER — Other Ambulatory Visit: Payer: Self-pay

## 2018-02-22 DIAGNOSIS — N186 End stage renal disease: Secondary | ICD-10-CM

## 2018-02-22 DIAGNOSIS — I120 Hypertensive chronic kidney disease with stage 5 chronic kidney disease or end stage renal disease: Secondary | ICD-10-CM | POA: Diagnosis not present

## 2018-02-22 DIAGNOSIS — N170 Acute kidney failure with tubular necrosis: Secondary | ICD-10-CM | POA: Diagnosis not present

## 2018-02-22 DIAGNOSIS — Z992 Dependence on renal dialysis: Secondary | ICD-10-CM

## 2018-02-22 DIAGNOSIS — I9589 Other hypotension: Secondary | ICD-10-CM | POA: Diagnosis not present

## 2018-02-22 DIAGNOSIS — K7290 Hepatic failure, unspecified without coma: Secondary | ICD-10-CM | POA: Diagnosis present

## 2018-02-22 DIAGNOSIS — Z794 Long term (current) use of insulin: Secondary | ICD-10-CM | POA: Diagnosis not present

## 2018-02-22 DIAGNOSIS — E1122 Type 2 diabetes mellitus with diabetic chronic kidney disease: Secondary | ICD-10-CM

## 2018-02-22 DIAGNOSIS — Z79899 Other long term (current) drug therapy: Secondary | ICD-10-CM

## 2018-02-22 DIAGNOSIS — K767 Hepatorenal syndrome: Secondary | ICD-10-CM

## 2018-02-22 DIAGNOSIS — Z888 Allergy status to other drugs, medicaments and biological substances status: Secondary | ICD-10-CM

## 2018-02-22 DIAGNOSIS — Z87891 Personal history of nicotine dependence: Secondary | ICD-10-CM | POA: Diagnosis not present

## 2018-02-22 DIAGNOSIS — R188 Other ascites: Secondary | ICD-10-CM

## 2018-02-22 DIAGNOSIS — R238 Other skin changes: Secondary | ICD-10-CM | POA: Diagnosis not present

## 2018-02-22 DIAGNOSIS — K7460 Unspecified cirrhosis of liver: Secondary | ICD-10-CM | POA: Diagnosis not present

## 2018-02-22 DIAGNOSIS — D696 Thrombocytopenia, unspecified: Secondary | ICD-10-CM | POA: Diagnosis not present

## 2018-02-22 LAB — CBC
HEMATOCRIT: 38.6 % — AB (ref 39.0–52.0)
HEMOGLOBIN: 12.6 g/dL — AB (ref 13.0–17.0)
MCH: 29.2 pg (ref 26.0–34.0)
MCHC: 32.6 g/dL (ref 30.0–36.0)
MCV: 89.6 fL (ref 78.0–100.0)
Platelets: 69 10*3/uL — ABNORMAL LOW (ref 150–400)
RBC: 4.31 MIL/uL (ref 4.22–5.81)
RDW: 16.5 % — ABNORMAL HIGH (ref 11.5–15.5)
WBC: 7.7 10*3/uL (ref 4.0–10.5)

## 2018-02-22 LAB — GLUCOSE, CAPILLARY
Glucose-Capillary: 258 mg/dL — ABNORMAL HIGH (ref 70–99)
Glucose-Capillary: 220 mg/dL — ABNORMAL HIGH (ref 70–99)
Glucose-Capillary: 122 mg/dL — ABNORMAL HIGH (ref 70–99)
Glucose-Capillary: 124 mg/dL — ABNORMAL HIGH (ref 70–99)

## 2018-02-22 LAB — RENAL FUNCTION PANEL
ALBUMIN: 1.7 g/dL — AB (ref 3.5–5.0)
ANION GAP: 11 (ref 5–15)
BUN: 38 mg/dL — ABNORMAL HIGH (ref 6–20)
CO2: 24 mmol/L (ref 22–32)
Calcium: 8.1 mg/dL — ABNORMAL LOW (ref 8.9–10.3)
Chloride: 103 mmol/L (ref 98–111)
Creatinine, Ser: 6.4 mg/dL — ABNORMAL HIGH (ref 0.61–1.24)
GFR calc Af Amer: 10 mL/min — ABNORMAL LOW (ref >60)
GFR calc non Af Amer: 9 mL/min — ABNORMAL LOW (ref >60)
GLUCOSE: 206 mg/dL — AB (ref 70–99)
PHOSPHORUS: 5.2 mg/dL — AB (ref 2.5–4.6)
POTASSIUM: 3.7 mmol/L (ref 3.5–5.1)
Sodium: 138 mmol/L (ref 135–145)

## 2018-02-22 LAB — BODY FLUID CELL COUNT WITH DIFFERENTIAL
LYMPHS FL: 86 %
Monocyte-Macrophage-Serous Fluid: 14 % — ABNORMAL LOW (ref 50–90)
WBC FLUID: 42 uL (ref 0–1000)

## 2018-02-22 LAB — ALBUMIN, PLEURAL OR PERITONEAL FLUID: Albumin, Fluid: <1 g/dL

## 2018-02-22 LAB — GLUCOSE, PLEURAL OR PERITONEAL FLUID: Glucose, Fluid: 246 mg/dL

## 2018-02-22 LAB — LACTATE DEHYDROGENASE, PLEURAL OR PERITONEAL FLUID: LD FL: 30 U/L — AB (ref 3–23)

## 2018-02-22 LAB — GRAM STAIN

## 2018-02-22 LAB — PROTEIN, PLEURAL OR PERITONEAL FLUID

## 2018-02-22 MED ORDER — INSULIN GLARGINE 100 UNIT/ML ~~LOC~~ SOLN
20.0000 [IU] | Freq: Every day | SUBCUTANEOUS | Status: DC
  Administered 2018-02-22: 20 [IU] via SUBCUTANEOUS
  Filled 2018-02-22: qty 0.2

## 2018-02-22 MED ORDER — LIDOCAINE HCL (PF) 1 % IJ SOLN: 5.0000 mL | INTRAMUSCULAR | Status: DC | PRN

## 2018-02-22 MED ORDER — INSULIN ASPART 100 UNIT/ML ~~LOC~~ SOLN
0.0000 [IU] | Freq: Three times a day (TID) | SUBCUTANEOUS | Status: DC
Start: 2018-02-22 — End: 2018-02-23
  Administered 2018-02-22: 3 [IU] via SUBCUTANEOUS
  Administered 2018-02-22: 1 [IU] via SUBCUTANEOUS

## 2018-02-22 MED ORDER — PANTOPRAZOLE SODIUM 40 MG PO TBEC
40.0000 mg | DELAYED_RELEASE_TABLET | Freq: Every day | ORAL | Status: DC
  Administered 2018-02-22: 40 mg via ORAL
  Filled 2018-02-22: qty 1

## 2018-02-22 MED ORDER — HEPARIN SODIUM (PORCINE) 5000 UNIT/ML IJ SOLN: 5000.0000 [IU] | Freq: Three times a day (TID) | INTRAMUSCULAR | Status: DC

## 2018-02-22 MED ORDER — ACETAMINOPHEN 325 MG PO TABS: 650.0000 mg | ORAL_TABLET | Freq: Four times a day (QID) | ORAL | Status: DC | PRN

## 2018-02-22 MED ORDER — CHLORHEXIDINE GLUCONATE CLOTH 2 % EX PADS: 6.0000 | MEDICATED_PAD | Freq: Every day | CUTANEOUS | Status: DC

## 2018-02-22 MED ORDER — ACETAMINOPHEN 650 MG RE SUPP: 650.0000 mg | Freq: Four times a day (QID) | RECTAL | Status: DC | PRN

## 2018-02-22 MED ORDER — HEPARIN SODIUM (PORCINE) 1000 UNIT/ML DIALYSIS
1000.0000 [IU] | INTRAMUSCULAR | Status: DC | PRN
  Filled 2018-02-22: qty 1

## 2018-02-22 MED ORDER — RIFAXIMIN 550 MG PO TABS: 550.0000 mg | ORAL_TABLET | Freq: Two times a day (BID) | ORAL | Status: DC

## 2018-02-22 MED ORDER — LIDOCAINE-PRILOCAINE 2.5-2.5 % EX CREA
1.0000 "application " | TOPICAL_CREAM | CUTANEOUS | Status: DC | PRN
  Filled 2018-02-22: qty 5

## 2018-02-22 MED ORDER — PRO-STAT SUGAR FREE PO LIQD: 30.0000 mL | Freq: Two times a day (BID) | ORAL | Status: DC

## 2018-02-22 MED ORDER — MIDODRINE HCL 5 MG PO TABS
ORAL_TABLET | ORAL | Status: AC
  Filled 2018-02-22: qty 2

## 2018-02-22 MED ORDER — LIDOCAINE 5 % EX PTCH
1.0000 | MEDICATED_PATCH | CUTANEOUS | Status: DC
  Administered 2018-02-22: 1 via TRANSDERMAL
  Filled 2018-02-22: qty 1

## 2018-02-22 MED ORDER — SODIUM CHLORIDE 0.9 % IV SOLN: 100.0000 mL | INTRAVENOUS | Status: DC | PRN

## 2018-02-22 MED ORDER — INSULIN ASPART 100 UNIT/ML ~~LOC~~ SOLN
0.0000 [IU] | Freq: Every day | SUBCUTANEOUS | Status: DC
  Administered 2018-02-22: 3 [IU] via SUBCUTANEOUS

## 2018-02-22 MED ORDER — FERRIC CITRATE 1 GM 210 MG(FE) PO TABS
420.0000 mg | ORAL_TABLET | Freq: Three times a day (TID) | ORAL | Status: DC
  Filled 2018-02-22 (×2): qty 2

## 2018-02-22 MED ORDER — HEPARIN SODIUM (PORCINE) 1000 UNIT/ML DIALYSIS
20.0000 [IU]/kg | INTRAMUSCULAR | Status: DC | PRN
  Filled 2018-02-22: qty 2

## 2018-02-22 MED ORDER — MIDODRINE HCL 5 MG PO TABS
10.0000 mg | ORAL_TABLET | Freq: Three times a day (TID) | ORAL | Status: DC
  Administered 2018-02-22 (×3): 10 mg via ORAL
  Filled 2018-02-22 (×2): qty 2

## 2018-02-22 MED ORDER — FERRIC CITRATE 1 GM 210 MG(FE) PO TABS: 420.0000 mg | ORAL_TABLET | Freq: Three times a day (TID) | ORAL | Status: DC

## 2018-02-22 MED ORDER — PENTAFLUOROPROP-TETRAFLUOROETH EX AERO: 1.0000 "application " | INHALATION_SPRAY | CUTANEOUS | Status: DC | PRN

## 2018-02-22 MED ORDER — OXYCODONE-ACETAMINOPHEN 5-325 MG PO TABS
1.0000 | ORAL_TABLET | Freq: Three times a day (TID) | ORAL | Status: DC | PRN
  Administered 2018-02-22: 2 via ORAL
  Filled 2018-02-22 (×2): qty 2

## 2018-02-22 NOTE — Progress Notes (Addendum)
   Subjective:  Overnight: No acute events reported.   Today, patient was examined at bedside this morning and reported that he is feeling well. He states that his SOB has significantly improved following the paracentesis. He endorses good appetite and currently denies fevers, chills, nausea, vomiting, abdominal pain, changes in mentation or skin discoloration. He is well aware [facility] and feeling hopeful. HE also reports that he previously received Lidoderm patch for "chonic pain."   Objective:  Vital signs in last 24 hours: Vitals:   02/22/18 0000 02/22/18 0140 02/22/18 0217 02/22/18 0740  BP: 109/67  113/62 91/69  Pulse: 90  85 68  Resp: 17   17  Temp:   97.6 F (36.4 C) (!) 97.5 F (36.4 C)  TempSrc:   Oral Oral  SpO2: 95%  100% 100%  Weight:  162 lb 4.1 oz (73.6 kg)    Height:  5\' 8"  (1.727 m)      Physical Exam:  Constitutional: Pleasant gentleman in no acute distress HEENT: Normocephalic, atraumatic, non-icterus Cardiovascular: Normal S1, S2, no murmurs, gallops, rubs Respiratory: Normal breath sounds with exception of mild crackles in Left posterior lower lobe  Abdomen: Bowel sounds present, non tender to palpation, distention (improved) Skin: Tender hemorrhagic blisters on  Left medial thigh, non-dermatone distribution  Assessment/Plan: [name] is a 57 year old male with history of Cirrhosis, ESRD currently on HD (MWF), HTN and Diabetes Mellitus who presented to the ED on 02/21/2018 with worsening dyspnea and significant abdominal discomfort secondary to ascites.   Decompensated Cirrhosis with significant ascites.  Patient has a known history of decompensated cirrhosis. Per chart review, he was recently hospitalized on 02/11/18 with volume overload and SOB. He is s/p paracentesis with 4L removed in the ED. Symptoms have significantly improved since procedure and is doing well  -MELD Score 24 -Child-Pugh Score 9 points ; Class  B -Thrombocytopenia, stable and improved from last admission. Most likely secondary to spleen sequestration and decompensated cirrhosis -Plan is to continue Rifaximin 550mg  BID -Continue home lactulose regimen  -Peritoneal fluid albumin <1 g/dL. Plan to start prophylaxis for SBP -[facility] for evaluation of Liver transplant  ESRD on HD secondary to hepatorenal syndrome. -Currently scheduled for in patient dialysis today [facility] -HD schedule is MWF however he is unable to have adequate output due to chronic hypotension during HD -[facility] for evaluation of kidney transplant  Hemorrhagic blisters medial aspect of LLE -Patient has painful blisters on medial thigh of LLE. However, they are not distributed in a dermatome. Per review, his Hepatitis C panel is negative on 02/12/2018  Chronic Hypotension - Continue midodrine 10mg  TID  DM -On Lantus 20U QD -Novolog sliding scale   Diet: Renal / carb modified  VTE ppx: SubQ Heparin  CODE STATUS: Full code  Dispo: Anticipated discharge in approximately less than 1 day.   Yvette RackAgyei, Obed K, MD 02/22/2018, 11:45 AM Pager: [phone]

## 2018-02-22 NOTE — Care Management CC44 (Signed)
Condition Code 44 Documentation Completed  Patient Details  Name: [name] MRN: [id number] Date of Birth: 09/17/1960   Condition Code 44 given:  Yes Patient signature on Condition Code 44 notice:    Documentation of 2 MD's agreement:  Yes Code 44 added to claim:  Yes    Leone Havenaylor, Creola Krotz [place], RN 02/22/2018, 12:17 PM

## 2018-02-22 NOTE — Care Management Obs Status (Signed)
MEDICARE OBSERVATION STATUS NOTIFICATION   Patient Details  Name: [name] MRN: [id number] Date of Birth: 03/20/1961   Medicare Observation Status Notification Given:  Yes    Leone Havenaylor, Shervin Cypert [place], RN 02/22/2018, 12:17 PM

## 2018-02-22 NOTE — Plan of Care (Signed)
Discussed plan of care with patient.  Patient is frustrated by the amount of time it took to obtain a bed from the E.D.  He was not very receptive to discussing his plan of care.  He hopes to be discharged today or tomorrow.  Minimal teach back displayed.

## 2018-02-22 NOTE — Telephone Encounter (Signed)
Hospital TOC per [facility], discharge 02/22/2018, appt 02/26/2018.

## 2018-02-22 NOTE — Plan of Care (Signed)
Discussed with patient and wife plan of care for the evening, pain management, medications and wound care for abdomen and RLE with some teach back displayed.  Wife remind us that dialysis wound need to be completed asap in the morning for impending discharge to home for appointment with [facility] on 7/2 for transplant.

## 2018-02-22 NOTE — Discharge Summary (Signed)
Name: Faythe Gheearon P Carlo MRN: [id number] DOB: 07/12/1961 57 y.o. PCP: Dettinger, Elige RadonJoshua A, MD  Date of Admission: 02/21/2018  8:34 PM Date of Discharge: 02/22/2018 Attending Physician: Blanch MediaButcher, Elizabeth, MD  Discharge Diagnosis: 1. Decompensate Cirrhosis with Ascites 2. Shortness of Breath  Discharge Medications: Allergies as of 02/22/2018      Reactions   Ketorolac Tromethamine Hives   Temsirolimus Other (See Comments)   Unknown - Pt does not recognize this medication [facility]      Medication List    STOP taking these medications   guaiFENesin 600 MG 12 hr tablet Commonly known as:  MUCINEX     TAKE these medications   Abdominal Pad 8"X10" Pads 1 each by Does not apply route as needed. Use as needed   KERLIX BANDAGE ROLL Misc Use as needed   albuterol 108 (90 Base) MCG/ACT inhaler Commonly known as:  PROVENTIL HFA;VENTOLIN HFA Inhale 2 puffs into the lungs every 6 (six) hours as needed for wheezing or shortness of breath.   calcium carbonate (dosed in mg elemental calcium) 1250 MG/5ML Susp Take 5 mLs (500 mg of elemental calcium total) by mouth every 6 (six) hours as needed for indigestion.   fentaNYL 25 MCG/HR patch Commonly known as:  DURAGESIC - dosed mcg/hr Place 25 mcg onto the skin every 3 (three) days.   ferric citrate 1 GM 210 MG(Fe) tablet Commonly known as:  AURYXIA Take 2 tablets (420 mg total) by mouth 3 (three) times daily with meals.   insulin aspart 100 UNIT/ML FlexPen Commonly known as:  NOVOLOG FLEXPEN INJECT 7 TO 15 UNITS subcu up to 4 times daily What changed:    how much to take  how to take this  when to take this  additional instructions   Insulin Glargine 100 UNIT/ML Solostar Pen Commonly known as:  LANTUS SOLOSTAR Inject 26 Units into the skin daily. What changed:  how much to take   lactulose 10 GM/15ML solution Commonly known as:  CHRONULAC Take 15 mLs (10 g total) by mouth 2 (two) times daily.   midodrine 10 MG  tablet Commonly known as:  PROAMATINE Take 1 tablet (10 mg total) by mouth 3 (three) times daily with meals. What changed:  how much to take   oxycodone 5 MG capsule Commonly known as:  OXY-IR Take 5 mg by mouth every 4 (four) hours as needed for pain.   pantoprazole 40 MG tablet Commonly known as:  PROTONIX Take 1 tablet (40 mg total) by mouth daily.   rifaximin 550 MG Tabs tablet Commonly known as:  XIFAXAN Take 1 tablet (550 mg total) by mouth 2 (two) times daily.   VASELINE PETROLATUM GAUZE Pads Use as needed       Disposition and follow-up:   Mr.Rankin P [name] was [facility] [place].  At the hospital follow up visit please address:  1.  Decompensated Cirrhosis with Ascites - Patient should have regular follow ups with either gastroenterologist or PCP for therapeutic paracentesis.  2.  Labs / imaging needed at time of follow-up: BMP, CBC, LFT  3.  Pending labs/ test needing follow-up: None  Follow-up Appointments: Follow-up Information    [facility]. Go on 02/26/2018.   Why:  At 2:45pm for re-evaluation of ascites to see if you need to draw off fluid again Contact information: [address] [place] [phone]       Dettinger, Elige RadonJoshua A, MD.  Schedule an appointment as soon as possible for a visit in 2 week(s).   Specialties:  Family Medicine, Cardiology Contact information: [address] [place] [address] [phone]           Hospital Course by problem list: 1. Decompensated Cirrhosis with significant ascites.  Mr. Thackston is a 57 y.o male with ESRD on HD (MWF), Decompensated Cirrhosis [MELD Score 25 & Child-Pugh Score 9 points ClassB], HTN, and DM who presented to the ED on 02/21/18 with progressive SOB and abdominal distention secondary to decompensated cirrhosis with ascites and volume overload. Patient reports he had been unsuccessful contacting  Gastroenterologist to undergo therapeutic paracentesis. In the ED, he was hypoxemic with O2 Sat of 82% on room air. He underwent paracentesis with 4L removed which significantly improved his symptoms. Post paracentesis sPO2 was 100% on 2L [place]. He did not have any symptoms of hepatic encephalopathy during this admission. He had been working with the Kidney and Liver transplant team at [facility] before admission and had an appointment on 02/23/18. He was then discharged to [facility] after he was stabilized for further evaluation for transplant.  2. ESRD on HD secondary to hepatorenal syndrome. Mr. Matranga reports that he began receiving dialysis in March 2019 and is on MWF schedule. However, he frequently experiences hypotension during dialysis which impairs the amounts of fluid that is removed. During this hospitalization, patient underwent HD on 02/22/18 with a goal of 3L fluid removal.   3.Chronic Hypotension Mr. Renbarger has a history of Chronic hypotension and is currently on midodrine 10mg  TID. During this hospitalization, his BP range was 70s-110s/60s-80s. He was appropriately treated with Midodrine 10mg  TID as tolerated.   4. Diabetes Mellitus Blood glucose on admission was in the 250s but no signs or symptoms of DKA or HHS. He was placed on Novolog sliding scale and Lantus 20U. At discharge, BG was 124.       Discharge Vitals:   BP 93/67 (BP Location: Right Arm)   Pulse 98   Temp 98.3 F (36.8 C) (Oral)   Resp 12   Ht 5\' 8"  (1.727 m)   Wt 156 lb 8.4 oz (71 kg)   SpO2 98%   BMI 23.80 kg/m   Pertinent Labs, Studies, and Procedures:  1) CXR IMPRESSION: Stable cardiomegaly. Stable low lung volumes and basilar plate like atelectasis. Pulmonary vascular congestion.  2) Paracentesis fluid analysis   Ref. Range 02/21/2018 21:47  Albumin, Fluid Latest Units: g/dL <9.1  Fluid Type-FALB Unknown PERITONEAL CAVITY  Glucose, Fluid Latest Units: mg/dL 478  Fluid Type-FGLU Unknown PERITONEAL  CAVITY  Fluid Type-FLDH Unknown PERITONEAL CAVITY  LD, Fluid Latest Ref Range: 3 - 23 U/L 30 (H)  Total protein, fluid Latest Units: g/dL <2.9  Fluid Type-FCT Unknown Peritoneal  Fluid Type-FTP Unknown PERITONEAL CAVITY  Color, Fluid Latest Ref Range: YELLOW  YELLOW (A)  WBC, Fluid Latest Ref Range: 0 - 1,000 cu mm 42  Lymphs, Fluid Latest Units: % 86  Eos, Fluid Latest Units: %   Appearance, Fluid Latest Ref Range: CLEAR  CLEAR  Other Cells, Fluid Latest Units: %   Neutrophil Count, Fluid Latest Ref Range: 0 - 25 %   Monocyte-Macrophage-Serous Fluid Latest Ref Range: 50 - 90 % 14 (L)   Discharge Instructions: Discharge Instructions    (HEART FAILURE PATIENTS) Call MD:  Anytime you have any of the following symptoms: 1) 3 pound weight gain in 24 hours or 5 pounds in 1 week 2) shortness of breath,  with or without a dry hacking cough 3) swelling in the hands, feet or stomach 4) if you have to sleep on extra pillows at night in order to breathe.   Complete by:  As directed    Call MD for:  difficulty breathing, headache or visual disturbances   Complete by:  As directed    Call MD for:  extreme fatigue   Complete by:  As directed    Call MD for:  hives   Complete by:  As directed    Call MD for:  persistant dizziness or light-headedness   Complete by:  As directed    Call MD for:  persistant nausea and vomiting   Complete by:  As directed    Call MD for:  redness, tenderness, or signs of infection (pain, swelling, redness, odor or green/yellow discharge around incision site)   Complete by:  As directed    Call MD for:  severe uncontrolled pain   Complete by:  As directed    Call MD for:  temperature >100.4   Complete by:  As directed    Diet - low sodium heart healthy   Complete by:  As directed    Discharge instructions   Complete by:  As directed    We have made an appointment for you in our clinic (the [facility]) located in the basement of [facility]  underneath the Emergency Department that way we can assess you abdomen and see if you need another paracentesis to draw off more fluid; this is an option as it seems you are having trouble getting an appointment through the [place]. We are hoping to help you avoid another hospitalization if all you need is to draw off fluid. Please call our clinic if you have any questions.   Increase activity slowly   Complete by:  As directed       Signed: Yvette Rack, MD 02/24/2018, 9:21 PM   Pager: @[phone] @

## 2018-02-22 NOTE — Consult Note (Signed)
[facility] Renal Consultation Note    Indication for Consultation:  Management of ESRD/hemodialysis; anemia, hypertension/volume and secondary hyperparathyroidism PCP:  HPI: [name] is a 57 y.o. male with ESRD 2/2 hepatorenal syndrome, acholic cirrhosis w/thrombocytopenia, Type 2 DM, chronic low back pain, GERD.   He is admitted under observation status with worsening dyspnea and ascites. He was hypoxic on admission. CXR showing pulm vascular congestion.  He underwent paracentesis in the ED overnight  with 4L fluid removed.  He was recently admitted 02/11/18 with similar presentation. He underwent paracentesis during that admit  with 3L fluid removed. He saw his primary care physician on 6/26 and was referred to GI so they could arrange  outpatient paracentesis q 2 weeks.   Seen at beside and says his breathing has improved with fluid removal. O2 sats 100% on 2L [place]. Abdominal pain resolved. Denies CP, nausea, vomiting. He is very concerned about making it to a transplant appointment at [facility] tomorrow. He is hoping for liver/kidney transplant.    [facility]. Unfortunately have not been able to remove much fluid during dialysis sessions d/t chronic hypotension. BPs usually 90-100s/60s. Last dialysis was Friday 6/28. He left 6.2kg over his dry weight.     Past Medical History:  Diagnosis Date  . Chronic lower back pain   . Compression fracture    lumbar 3  . Diabetes mellitus   . Fracture acetabulum-closed (HCC) 04/09/2013  . GERD (gastroesophageal reflux disease)   . H/O Legionnaire's disease   . Hepatomegaly   . Migraine   . Osteoporosis   . Pancreatitis   . PTSD (post-traumatic stress disorder)   . Vitamin D deficiency    Past Surgical History:  Procedure Laterality Date  . AV FISTULA PLACEMENT Left 12/10/2017   Procedure: INSERTION OF ARTERIOVENOUS (AV) GORE-TEX GRAFT LEFT  ARM;  Surgeon: Chuck Hintickson, Christopher S, MD;  [facility];  Service:  Vascular;  Laterality: Left;  . BACK SURGERY    . CHOLECYSTECTOMY    . HERNIA REPAIR    . IR FLUORO GUIDE CV LINE RIGHT  11/27/2017  . IR US GUIDE VASC ACCESS RIGHT  11/27/2017  . KYPHOPLASTY N/A 07/16/2015   Procedure: Lumbar three kyphoplasty;  Surgeon: Coletta MemosKyle Cabbell, MD;  Location: [facility];  Service: Neurosurgery;  Laterality: N/A;  Lumbar three kyphoplasty  . LAMINECTOMY    . MASS EXCISION Left 06/12/2017   Procedure: EXCISION LEFT AXILLARY SEBACEOUS CYST;  Surgeon: Abigail MiyamotoBlackman, Douglas, MD;  Location: [facility];  Service: General;  Laterality: Left;  . STERNUM FRACTURE SURGERY     Family History  Problem Relation Age of Onset  . Cancer Unknown        breast/grandmother, prostate/grandfather   Social History:  reports that he quit smoking about 10 years ago. His smoking use included cigarettes. He has a 13.00 pack-year smoking history. He has never used smokeless tobacco. He reports that he drinks about 3.6 oz of alcohol per week. He reports that he does not use drugs. Allergies  Allergen Reactions  . Ketorolac Tromethamine Hives  . Temsirolimus Other (See Comments)    Unknown - Pt does not recognize this medication [facility]   Prior to Admission medications   Medication Sig Start Date End Date Taking? Authorizing Provider  oxycodone (OXY-IR) 5 MG capsule Take 5 mg by mouth every 4 (four) hours as needed for pain.   Yes [provider]  albuterol (PROVENTIL HFA;VENTOLIN HFA) 108 (90 Base) MCG/ACT inhaler  Inhale 2 puffs into the lungs every 6 (six) hours as needed for wheezing or shortness of breath. 10/16/17   Remus Loffler, PA-C  Calcium Carbonate Antacid (CALCIUM CARBONATE, DOSED IN MG ELEMENTAL CALCIUM,) 1250 MG/5ML SUSP Take 5 mLs (500 mg of elemental calcium total) by mouth every 6 (six) hours as needed for indigestion. 12/13/17   Elgergawy, Leana Roe, MD  fentaNYL (DURAGESIC - DOSED MCG/HR) 25 MCG/HR patch Place 25 mcg onto the skin every 3  (three) days.    [provider]  Gauze Pads & Dressings (ABDOMINAL PAD) 8"X10" PADS 1 each by Does not apply route as needed. Use as needed 02/09/18   Dettinger, Elige Radon, MD  Gauze Pads & Dressings [facility] BANDAGE ROLL) MISC Use as needed 02/09/18   Dettinger, Elige Radon, MD  guaiFENesin (MUCINEX) 600 MG 12 hr tablet Take 2 tablets (1,200 mg total) by mouth 2 (two) times daily. Patient taking differently: Take 600 mg by mouth 2 (two) times daily as needed for cough or to loosen phlegm.  10/18/17   Narda Bonds, MD  insulin aspart (NOVOLOG FLEXPEN) 100 UNIT/ML FlexPen INJECT 7 TO 15 UNITS subcu up to 4 times daily Patient taking differently: Inject 5-7 Units into the skin 3 (three) times daily with meals.  10/23/17   Dettinger, Elige Radon, MD  Insulin Glargine (LANTUS SOLOSTAR) 100 UNIT/ML Solostar Pen Inject 26 Units into the skin daily. Patient taking differently: Inject 35 Units into the skin daily.  12/13/17   Elgergawy, Leana Roe, MD  lactulose (CHRONULAC) 10 GM/15ML solution Take 15 mLs (10 g total) by mouth 2 (two) times daily. 12/13/17   Elgergawy, Leana Roe, MD  midodrine (PROAMATINE) 10 MG tablet Take 1 tablet (10 mg total) by mouth 3 (three) times daily with meals. Patient taking differently: Take 20 mg by mouth 3 (three) times daily with meals.  12/13/17   Elgergawy, Leana Roe, MD  pantoprazole (PROTONIX) 40 MG tablet Take 1 tablet (40 mg total) by mouth daily. 10/22/17   Dettinger, Elige Radon, MD  rifaximin (XIFAXAN) 550 MG TABS tablet Take 1 tablet (550 mg total) by mouth 2 (two) times daily. 12/13/17   Elgergawy, Leana Roe, MD  Wound Dressings (VASELINE PETROLATUM GAUZE) PADS Use as needed 02/09/18   Dettinger, Elige Radon, MD   Current Facility-Administered Medications  Medication Dose Route Frequency Provider Last Rate Last Dose  . acetaminophen (TYLENOL) tablet 650 mg  650 mg Oral Q6H PRN Levora Dredge, MD       Or  . acetaminophen (TYLENOL) suppository 650 mg  650 mg Rectal Q6H PRN  Levora Dredge, MD      . Chlorhexidine Gluconate Cloth 2 % PADS 6 each  6 each Topical Q0600 Tomasa Blase, PA-C      . heparin injection 5,000 Units  5,000 Units Subcutaneous Q8H Helberg, Justin, MD      . insulin aspart (novoLOG) injection 0-5 Units  0-5 Units Subcutaneous QHS Levora Dredge, MD   3 Units at 02/22/18 0159  . insulin aspart (novoLOG) injection 0-9 Units  0-9 Units Subcutaneous TID WC Levora Dredge, MD   3 Units at 02/22/18 [phone]  . insulin glargine (LANTUS) injection 20 Units  20 Units Subcutaneous Daily Levora Dredge, MD   20 Units at 02/22/18 [phone]  . lidocaine (LIDODERM) 5 % 1 patch  1 patch Transdermal Q24H Svalina, Gorica, MD      . midodrine (PROAMATINE) tablet 10 mg  10 mg Oral TID WC Levora Dredge, MD  10 mg at 02/22/18 0844  . oxyCODONE-acetaminophen (PERCOCET/ROXICET) 5-325 MG per tablet 1-2 tablet  1-2 tablet Oral Q8H PRN Levora Dredge, MD      . pantoprazole (PROTONIX) EC tablet 40 mg  40 mg Oral Daily Levora Dredge, MD   40 mg at 02/22/18 0844  . [START ON 02/23/2018] rifaximin (XIFAXAN) tablet 550 mg  550 mg Oral BID Helberg, Justin, MD        ROS: As per HPI otherwise negative.  Physical Exam: Vitals:   02/22/18 0000 02/22/18 0140 02/22/18 0217 02/22/18 0740  BP: 109/67  113/62 91/69  Pulse: 90  85 68  Resp: 17   17  Temp:   97.6 F (36.4 C) (!) 97.5 F (36.4 C)  TempSrc:   Oral Oral  SpO2: 95%  100% 100%  Weight:  73.6 kg (162 lb 4.1 oz)    Height:  5\' 8"  (1.727 m)       General: Chronically ill appearing male, frail NAD  Head: NCAT sclera not icteric MMM Neck: Supple. No JVD No masses Lungs: Normal WOB on nasal oxygen. Lung sounds diminished anteriorly  Heart: RRR with S1 S2 Abdomen: distended, non-tender  Lower extremities:  1+ LE edema R lower leg bandaged. Erythematous papules to L knee shin  Neuro: A & O  X 3. Moves all extremities spontaneously. Psych:  Responds to questions appropriately with a normal affect. Dialysis  Access: LUE AVG +bruit   Labs: Basic Metabolic Panel: Recent Labs  Lab 02/21/18 2038  NA 135  K 4.2  CL 101  CO2 21*  GLUCOSE 250*  BUN 32*  CREATININE 5.67*  CALCIUM 8.2*   Liver Function Tests: Recent Labs  Lab 02/21/18 2144  AST 32  ALT 17  ALKPHOS 244*  BILITOT 1.6*  PROT 5.5*  ALBUMIN 1.9*   No results for input(s): LIPASE, AMYLASE in the last 168 hours. No results for input(s): AMMONIA in the last 168 hours. CBC: Recent Labs  Lab 02/21/18 2038  WBC 10.6*  HGB 13.8  HCT 44.0  MCV 92.1  PLT 82*   Cardiac Enzymes: No results for input(s): CKTOTAL, CKMB, CKMBINDEX, TROPONINI in the last 168 hours. CBG: Recent Labs  Lab 02/22/18 0156 02/22/18 0742  GLUCAP 258* 220*   Iron Studies: No results for input(s): IRON, TIBC, TRANSFERRIN, FERRITIN in the last 72 hours. Studies/Results: Dg Chest 2 View  Result Date: 02/21/2018 CLINICAL DATA:  57 y/o  M; shortness of breath and fluid overload. EXAM: CHEST - 2 VIEW COMPARISON:  02/12/2018 chest radiograph FINDINGS: Stable enlarged cardiac silhouette given projection and technique. Aortic atherosclerosis with calcification. Low lung volumes. Streaky opacities in the lung bases. No pneumothorax. Stable multilevel loss of vertebral body height and kyphoplasty changes in the upper lumbar spine. Plate and screw fixation of the sternum. No acute osseous abnormality identified. IMPRESSION: Stable cardiomegaly. Stable low lung volumes and basilar platelike atelectasis. Pulmonary vascular congestion. Electronically Signed   By: Mitzi Hansen M.D.   On: 02/21/2018 21:53    Dialysis Orders:  [facility] MWF  4h 180NRe BFR 400/800 EDW 69kg 3K/2.25Ca  Hep bolus 2300 U No ESA/VDRA   Assessment/Plan: 1.  Dyspnea/Volume overload secondary to recurrent ascites/pulm edema on CXR. Improved some s/p 4L paracentesis last night. Low BPs limiting fluid removal with dialysis. Attempt volume removal as tolerated with HD today.   2. Cirrhosis with recurrent ascites  - s/p paracentesis as above. Has been referred to OP GI to arrange q 2 week paracentesis.  3.  ESRD -  MWF. For HD today on schedule. UF to EDW as tolerated  4.  Hypotension - chronic on midodrine 10 mg TID.  Dose pre HD.  5.  Anemia  - Hgb 13.8 No ESA needs  6.  Metabolic bone disease -  Corr Ca 9.9. Cont Auryxia binder.  7.  Nutrition - Low albumin 2/2 liver disease on prot supp 8. Thrombocytopenia, chronic, stable  9. DM Type 2   Raymund Manrique Susann Givens PA-C [facility] Pager [phone] 02/22/2018, 11:41 AM

## 2018-02-22 NOTE — Telephone Encounter (Signed)
lled to ask patient to sign release [place] and wife said she had to take to ER and he was admitted  Took 4 liters of fluid off and on dialysis

## 2018-02-22 NOTE — Procedures (Signed)
Patient seen on Hemodialysis. QB 400, UF goal 3L Treatment adjusted as needed.  Zetta BillsJay Nera Haworth MD [facility]. Office # [phone] Pager # [phone] 1:19 PM

## 2018-02-22 NOTE — ED Provider Notes (Signed)
[facility] EMERGENCY DEPARTMENT Provider Note   CSN: [id number] Arrival date & time: 02/21/18  2020     History   Chief Complaint Chief Complaint  Patient presents with  . Shortness of Breath  . Fluid Overload    HPI [name] is a 57 y.o. male.  HPI Patient has history of cirrhosis.  Hepatorenal syndrome and is on dialysis.  Monday Wednesday Friday dialysis last dialyzed on Friday.  Presents with shortness of breath abdominal pain.  Was hypoxic for EMS with sats of 82% on room air.  Has dull chest pain and scrotal swelling 2.  Increased swelling of the abdomen.  Recent admission to the hospital and around 10 days ago had last paracentesis.  No fevers.  Does have some abdominal pain also. Past Medical History:  Diagnosis Date  . Chronic lower back pain   . Compression fracture    lumbar 3  . Diabetes mellitus   . Fracture acetabulum-closed (HCC) 04/09/2013  . GERD (gastroesophageal reflux disease)   . H/O Legionnaire's disease   . Hepatomegaly   . Migraine   . Osteoporosis   . Pancreatitis   . PTSD (post-traumatic stress disorder)   . Vitamin D deficiency     Patient Active Problem List   Diagnosis Date Noted  . Hepatorenal syndrome (HCC) 02/11/2018  . Decompensated hepatic cirrhosis (HCC) 02/11/2018  . Pain, generalized   . Thrombocytopenia (HCC) 11/25/2017  . Alcohol abuse 11/25/2017  . ESRD (end stage renal disease) on dialysis (HCC)   . Goals of care, counseling/discussion   . Palliative care by specialist   . Acute renal failure (ARF) (HCC)   . Cirrhosis (HCC) 11/07/2017  . Influenza A 10/16/2017  . Closed fracture of second lumbar vertebra (HCC)   . Compression fracture of L3 lumbar vertebra with delayed healing 07/16/2015  . Compression fracture of lumbar vertebra (HCC) 07/16/2015  . Pressure ulcer 01/18/2015  . Acute respiratory failure with hypoxia (HCC)   . Hypoxia   . Lumbar foraminal stenosis 04/19/2013  . Acetabulum  fracture, right (HCC) 04/09/2013  . Type 2 diabetes mellitus (HCC) 04/09/2013  . CIRRHOSIS, ALCOHOLIC 06/12/2010  . LIVER FUNCTION TESTS, ABNORMAL, HX OF 10/02/2009  . PANCREATITIS, HX OF 09/27/2009  . HYPOGONADISM 08/29/2009  . VITAMIN D DEFICIENCY 08/29/2009  . OSTEOPOROSIS 02/22/2009  . Hypertension associated with diabetes (HCC) 02/08/2009  . GERD 02/08/2009  . BACK PAIN, LUMBAR, CHRONIC 02/08/2009  . LEG EDEMA, LEFT 02/08/2009  . Hepatomegaly 02/08/2009    Past Surgical History:  Procedure Laterality Date  . AV FISTULA PLACEMENT Left 12/10/2017   Procedure: INSERTION OF ARTERIOVENOUS (AV) GORE-TEX GRAFT LEFT  ARM;  Surgeon: Chuck Hint, MD;  Location: [facility] OR;  Service: Vascular;  Laterality: Left;  . BACK SURGERY    . CHOLECYSTECTOMY    . HERNIA REPAIR    . IR FLUORO GUIDE CV LINE RIGHT  11/27/2017  . IR US GUIDE VASC ACCESS RIGHT  11/27/2017  . KYPHOPLASTY N/A 07/16/2015   Procedure: Lumbar three kyphoplasty;  Surgeon: Coletta Memos, MD;  Location: [facility];  Service: Neurosurgery;  Laterality: N/A;  Lumbar three kyphoplasty  . LAMINECTOMY    . MASS EXCISION Left 06/12/2017   Procedure: EXCISION LEFT AXILLARY SEBACEOUS CYST;  Surgeon: Abigail Miyamoto, MD;  Location: [facility];  Service: General;  Laterality: Left;  . STERNUM FRACTURE SURGERY          Home Medications    Prior to Admission  medications   Medication Sig Start Date End Date Taking? Authorizing Provider  albuterol (PROVENTIL HFA;VENTOLIN HFA) 108 (90 Base) MCG/ACT inhaler Inhale 2 puffs into the lungs every 6 (six) hours as needed for wheezing or shortness of breath. 10/16/17   Remus Loffler, PA-C  Calcium Carbonate Antacid (CALCIUM CARBONATE, DOSED IN MG ELEMENTAL CALCIUM,) 1250 MG/5ML SUSP Take 5 mLs (500 mg of elemental calcium total) by mouth every 6 (six) hours as needed for indigestion. 12/13/17   Elgergawy, Leana Roe, MD  fentaNYL (DURAGESIC - DOSED MCG/HR) 25 MCG/HR patch  Place 25 mcg onto the skin every 3 (three) days.    [provider]  Gauze Pads & Dressings (ABDOMINAL PAD) 8"X10" PADS 1 each by Does not apply route as needed. Use as needed 02/09/18   Dettinger, Elige Radon, MD  Gauze Pads & Dressings [facility] BANDAGE ROLL) MISC Use as needed 02/09/18   Dettinger, Elige Radon, MD  guaiFENesin (MUCINEX) 600 MG 12 hr tablet Take 2 tablets (1,200 mg total) by mouth 2 (two) times daily. Patient taking differently: Take 600 mg by mouth 2 (two) times daily as needed for cough or to loosen phlegm.  10/18/17   Narda Bonds, MD  insulin aspart (NOVOLOG FLEXPEN) 100 UNIT/ML FlexPen INJECT 7 TO 15 UNITS subcu up to 4 times daily Patient taking differently: Inject 5-7 Units into the skin 3 (three) times daily with meals.  10/23/17   Dettinger, Elige Radon, MD  Insulin Glargine (LANTUS SOLOSTAR) 100 UNIT/ML Solostar Pen Inject 26 Units into the skin daily. Patient taking differently: Inject 35 Units into the skin daily.  12/13/17   Elgergawy, Leana Roe, MD  lactulose (CHRONULAC) 10 GM/15ML solution Take 15 mLs (10 g total) by mouth 2 (two) times daily. 12/13/17   Elgergawy, Leana Roe, MD  midodrine (PROAMATINE) 10 MG tablet Take 1 tablet (10 mg total) by mouth 3 (three) times daily with meals. Patient taking differently: Take 15 mg by mouth 3 (three) times daily with meals.  12/13/17   Elgergawy, Leana Roe, MD  pantoprazole (PROTONIX) 40 MG tablet Take 1 tablet (40 mg total) by mouth daily. 10/22/17   Dettinger, Elige Radon, MD  rifaximin (XIFAXAN) 550 MG TABS tablet Take 1 tablet (550 mg total) by mouth 2 (two) times daily. 12/13/17   Elgergawy, Leana Roe, MD  Wound Dressings (VASELINE PETROLATUM GAUZE) PADS Use as needed 02/09/18   Dettinger, Elige Radon, MD    Family History Family History  Problem Relation Age of Onset  . Cancer Unknown        breast/grandmother, prostate/grandfather    Social History Social History   Tobacco Use  . Smoking status: Former Smoker    Packs/day:  0.50    Years: 26.00    Pack years: 13.00    Types: Cigarettes    Last attempt to quit: 08/26/2007    Years since quitting: 10.5  . Smokeless tobacco: Never Used  Substance Use Topics  . Alcohol use: Yes    Alcohol/week: 3.6 oz    Types: 6 Glasses of wine per week    Comment: 6 oz x 6 glass of wine daily   . Drug use: No     Allergies   Ketorolac tromethamine and Temsirolimus   Review of Systems Review of Systems  Constitutional: Positive for appetite change.  HENT: Negative for congestion.   Respiratory: Positive for shortness of breath.   Cardiovascular: Positive for chest pain.  Gastrointestinal: Positive for abdominal pain.  Genitourinary: Negative for  flank pain.  Musculoskeletal: Negative for back pain.  Skin: Negative for rash.  Neurological: Negative for numbness.  Hematological: Negative for adenopathy.  Psychiatric/Behavioral: Negative for confusion.     Physical Exam Updated Vital Signs BP 115/73   Pulse 89   Temp 98.1 F (36.7 C) (Oral)   Resp 12   SpO2 98%   Physical Exam  Constitutional: He appears well-developed.  HENT:  Head: Atraumatic.  Eyes:  Disconjugate gaze  Cardiovascular: Regular rhythm.  Pulmonary/Chest: Effort normal.  Some rales bilateral bases.  Abdominal: He exhibits distension. There is tenderness.  Distention with some tenderness.  Musculoskeletal:  Pitting edema bilateral lower extremities.  Neurological: He is alert.  Skin: Capillary refill takes less than 2 seconds.  Scrotum with anasarca.  Healing wounds right lower extremity.  Chronic changes bilateral lower extremities.     ED Treatments / Results  Labs (all labs ordered are listed, but only abnormal results are displayed) Labs Reviewed  BASIC METABOLIC PANEL - Abnormal; Notable for the following components:      Result Value   CO2 21 (*)    Glucose, Bld 250 (*)    BUN 32 (*)    Creatinine, Ser 5.67 (*)    Calcium 8.2 (*)    GFR calc non Af Amer 10 (*)     GFR calc Af Amer 12 (*)    All other components within normal limits  CBC - Abnormal; Notable for the following components:   WBC 10.6 (*)    RDW 17.0 (*)    Platelets 82 (*)    All other components within normal limits  HEPATIC FUNCTION PANEL - Abnormal; Notable for the following components:   Total Protein 5.5 (*)    Albumin 1.9 (*)    Alkaline Phosphatase 244 (*)    Total Bilirubin 1.6 (*)    Bilirubin, Direct 0.7 (*)    All other components within normal limits  CULTURE, BODY FLUID-BOTTLE  GRAM STAIN  PROTIME-INR  LACTATE DEHYDROGENASE, PLEURAL OR PERITONEAL FLUID  GLUCOSE, PLEURAL OR PERITONEAL FLUID  PROTEIN, PLEURAL OR PERITONEAL FLUID  ALBUMIN, PLEURAL OR PERITONEAL FLUID  BODY FLUID CELL COUNT WITH DIFFERENTIAL  I-STAT TROPONIN, ED    EKG EKG Interpretation  Date/Time:  Sunday February 21 2018 20:27:31 EDT Ventricular Rate:  109 PR Interval:  138 QRS Duration: 78 QT Interval:  342 QTC Calculation: 460 R Axis:   25 Text Interpretation:  Sinus tachycardia Inferior infarct , age undetermined Anterior infarct , age undetermined Abnormal ECG SINCE LAST TRACING HEART RATE HAS INCREASED Confirmed by Benjiman Core [phone]) on 02/21/2018 9:44:14 PM   Radiology Dg Chest 2 View  Result Date: 02/21/2018 CLINICAL DATA:  57 y/o  M; shortness of breath and fluid overload. EXAM: CHEST - 2 VIEW COMPARISON:  02/12/2018 chest radiograph FINDINGS: Stable enlarged cardiac silhouette given projection and technique. Aortic atherosclerosis with calcification. Low lung volumes. Streaky opacities in the lung bases. No pneumothorax. Stable multilevel loss of vertebral body height and kyphoplasty changes in the upper lumbar spine. Plate and screw fixation of the sternum. No acute osseous abnormality identified. IMPRESSION: Stable cardiomegaly. Stable low lung volumes and basilar platelike atelectasis. Pulmonary vascular congestion. Electronically Signed   By: Mitzi Hansen M.D.   On:  02/21/2018 21:53    Procedures .Paracentesis Date/Time: 02/22/2018 12:17 AM Performed by: Benjiman Core, MD Authorized by: Benjiman Core, MD   Consent:    Consent obtained:  Verbal   Consent given by:  Patient   Risks  discussed:  Bleeding, bowel perforation and infection   Alternatives discussed:  No treatment, delayed treatment, alternative treatment and observation Pre-procedure details:    Procedure purpose:  Therapeutic   Preparation: Patient was prepped and draped in usual sterile fashion   Anesthesia (see MAR for exact dosages):    Anesthesia method:  Local infiltration   Local anesthetic:  Lidocaine 1% w/o epi Procedure details:    Ultrasound guidance: yes     Puncture site:  L lower quadrant   Fluid removed amount:  4000 cc   Fluid appearance:  Amber and clear   Dressing:  4x4 sterile gauze Post-procedure details:    Patient tolerance of procedure:  Tolerated well, no immediate complications   (including critical care time)  Medications Ordered in ED Medications - No data to display   Initial Impression / Assessment and Plan / ED Course  I have reviewed the triage vital signs and the nursing notes.  Pertinent labs & imaging results that were available during my care of the patient were reviewed by me and considered in my medical decision making (see chart for details).     Patient with cirrhosis end-stage and end-stage renal disease.  Worsening shortness of breath.  Appears volume overloaded with ascites.  Paracentesis done by me in the ER and took off around 4 L.  Will admit to internal medicine. Final Clinical Impressions(s) / ED Diagnoses   Final diagnoses:  Cirrhosis of liver with ascites, unspecified hepatic cirrhosis type (HCC)  End stage renal disease on dialysis [facility](HCC)  Other ascites    ED Discharge Orders    None       Benjiman CorePickering, Kailah Pennel, MD 02/22/18 [phone]

## 2018-02-23 DIAGNOSIS — L0390 Cellulitis, unspecified: Secondary | ICD-10-CM | POA: Diagnosis not present

## 2018-02-23 DIAGNOSIS — Z87891 Personal history of nicotine dependence: Secondary | ICD-10-CM | POA: Diagnosis not present

## 2018-02-23 DIAGNOSIS — I959 Hypotension, unspecified: Secondary | ICD-10-CM | POA: Diagnosis not present

## 2018-02-23 DIAGNOSIS — I9589 Other hypotension: Secondary | ICD-10-CM | POA: Diagnosis not present

## 2018-02-23 DIAGNOSIS — K7031 Alcoholic cirrhosis of liver with ascites: Secondary | ICD-10-CM | POA: Diagnosis not present

## 2018-02-23 DIAGNOSIS — K7460 Unspecified cirrhosis of liver: Secondary | ICD-10-CM | POA: Diagnosis not present

## 2018-02-23 DIAGNOSIS — N186 End stage renal disease: Secondary | ICD-10-CM | POA: Diagnosis not present

## 2018-02-23 DIAGNOSIS — R0902 Hypoxemia: Secondary | ICD-10-CM | POA: Diagnosis not present

## 2018-02-23 DIAGNOSIS — R531 Weakness: Secondary | ICD-10-CM | POA: Diagnosis not present

## 2018-02-23 DIAGNOSIS — N179 Acute kidney failure, unspecified: Secondary | ICD-10-CM | POA: Diagnosis not present

## 2018-02-23 DIAGNOSIS — A419 Sepsis, unspecified organism: Secondary | ICD-10-CM | POA: Diagnosis not present

## 2018-02-24 DIAGNOSIS — N186 End stage renal disease: Secondary | ICD-10-CM | POA: Diagnosis not present

## 2018-02-25 DIAGNOSIS — I2510 Atherosclerotic heart disease of native coronary artery without angina pectoris: Secondary | ICD-10-CM | POA: Diagnosis not present

## 2018-02-25 DIAGNOSIS — F1010 Alcohol abuse, uncomplicated: Secondary | ICD-10-CM | POA: Diagnosis not present

## 2018-02-25 DIAGNOSIS — N186 End stage renal disease: Secondary | ICD-10-CM | POA: Diagnosis not present

## 2018-02-25 DIAGNOSIS — S81801D Unspecified open wound, right lower leg, subsequent encounter: Secondary | ICD-10-CM | POA: Diagnosis not present

## 2018-02-25 DIAGNOSIS — K219 Gastro-esophageal reflux disease without esophagitis: Secondary | ICD-10-CM | POA: Diagnosis not present

## 2018-02-25 DIAGNOSIS — M545 Low back pain: Secondary | ICD-10-CM | POA: Diagnosis not present

## 2018-02-25 DIAGNOSIS — E1122 Type 2 diabetes mellitus with diabetic chronic kidney disease: Secondary | ICD-10-CM | POA: Diagnosis not present

## 2018-02-25 DIAGNOSIS — K767 Hepatorenal syndrome: Secondary | ICD-10-CM | POA: Diagnosis not present

## 2018-02-25 DIAGNOSIS — K7030 Alcoholic cirrhosis of liver without ascites: Secondary | ICD-10-CM | POA: Diagnosis not present

## 2018-02-26 ENCOUNTER — Encounter: Payer: Self-pay | Admitting: Family Medicine

## 2018-02-26 ENCOUNTER — Ambulatory Visit: Payer: Medicare HMO

## 2018-02-26 DIAGNOSIS — N186 End stage renal disease: Secondary | ICD-10-CM | POA: Diagnosis not present

## 2018-02-27 LAB — CULTURE, BODY FLUID-BOTTLE: CULTURE: NO GROWTH

## 2018-02-27 LAB — CULTURE, BODY FLUID W GRAM STAIN -BOTTLE

## 2018-03-01 ENCOUNTER — Observation Stay (HOSPITAL_COMMUNITY)
Admission: EM | Admit: 2018-03-01 | Discharge: 2018-03-02 | Disposition: A | Discharge status: Home / Self Care | Payer: Medicare HMO | Attending: Internal Medicine | Admitting: Internal Medicine

## 2018-03-01 ENCOUNTER — Encounter (HOSPITAL_COMMUNITY): Payer: Self-pay | Admitting: Emergency Medicine

## 2018-03-01 ENCOUNTER — Emergency Department (HOSPITAL_COMMUNITY): Payer: Medicare HMO

## 2018-03-01 DIAGNOSIS — Z87891 Personal history of nicotine dependence: Secondary | ICD-10-CM

## 2018-03-01 DIAGNOSIS — K219 Gastro-esophageal reflux disease without esophagitis: Secondary | ICD-10-CM | POA: Insufficient documentation

## 2018-03-01 DIAGNOSIS — Z8619 Personal history of other infectious and parasitic diseases: Secondary | ICD-10-CM | POA: Diagnosis not present

## 2018-03-01 DIAGNOSIS — Z7189 Other specified counseling: Secondary | ICD-10-CM | POA: Diagnosis not present

## 2018-03-01 DIAGNOSIS — R627 Adult failure to thrive: Secondary | ICD-10-CM | POA: Diagnosis not present

## 2018-03-01 DIAGNOSIS — Z992 Dependence on renal dialysis: Secondary | ICD-10-CM

## 2018-03-01 DIAGNOSIS — R0902 Hypoxemia: Secondary | ICD-10-CM | POA: Diagnosis not present

## 2018-03-01 DIAGNOSIS — Z515 Encounter for palliative care: Secondary | ICD-10-CM

## 2018-03-01 DIAGNOSIS — I959 Hypotension, unspecified: Secondary | ICD-10-CM | POA: Insufficient documentation

## 2018-03-01 DIAGNOSIS — E11649 Type 2 diabetes mellitus with hypoglycemia without coma: Secondary | ICD-10-CM | POA: Insufficient documentation

## 2018-03-01 DIAGNOSIS — R188 Other ascites: Secondary | ICD-10-CM

## 2018-03-01 DIAGNOSIS — R140 Abdominal distension (gaseous): Secondary | ICD-10-CM | POA: Diagnosis not present

## 2018-03-01 DIAGNOSIS — K7031 Alcoholic cirrhosis of liver with ascites: Principal | ICD-10-CM | POA: Insufficient documentation

## 2018-03-01 DIAGNOSIS — I120 Hypertensive chronic kidney disease with stage 5 chronic kidney disease or end stage renal disease: Secondary | ICD-10-CM

## 2018-03-01 DIAGNOSIS — F4310 Post-traumatic stress disorder, unspecified: Secondary | ICD-10-CM | POA: Diagnosis not present

## 2018-03-01 DIAGNOSIS — M810 Age-related osteoporosis without current pathological fracture: Secondary | ICD-10-CM | POA: Diagnosis not present

## 2018-03-01 DIAGNOSIS — Z79899 Other long term (current) drug therapy: Secondary | ICD-10-CM

## 2018-03-01 DIAGNOSIS — G894 Chronic pain syndrome: Secondary | ICD-10-CM | POA: Insufficient documentation

## 2018-03-01 DIAGNOSIS — R0602 Shortness of breath: Secondary | ICD-10-CM

## 2018-03-01 DIAGNOSIS — G8929 Other chronic pain: Secondary | ICD-10-CM

## 2018-03-01 DIAGNOSIS — E559 Vitamin D deficiency, unspecified: Secondary | ICD-10-CM | POA: Diagnosis not present

## 2018-03-01 DIAGNOSIS — K7290 Hepatic failure, unspecified without coma: Secondary | ICD-10-CM | POA: Diagnosis not present

## 2018-03-01 DIAGNOSIS — R8589 Other abnormal findings in specimens from digestive organs and abdominal cavity: Secondary | ICD-10-CM | POA: Diagnosis not present

## 2018-03-01 DIAGNOSIS — E8770 Fluid overload, unspecified: Secondary | ICD-10-CM | POA: Diagnosis not present

## 2018-03-01 DIAGNOSIS — K7460 Unspecified cirrhosis of liver: Secondary | ICD-10-CM | POA: Diagnosis present

## 2018-03-01 DIAGNOSIS — E8779 Other fluid overload: Secondary | ICD-10-CM

## 2018-03-01 DIAGNOSIS — Z66 Do not resuscitate: Secondary | ICD-10-CM | POA: Insufficient documentation

## 2018-03-01 DIAGNOSIS — J811 Chronic pulmonary edema: Secondary | ICD-10-CM

## 2018-03-01 DIAGNOSIS — G43909 Migraine, unspecified, not intractable, without status migrainosus: Secondary | ICD-10-CM | POA: Diagnosis not present

## 2018-03-01 DIAGNOSIS — J810 Acute pulmonary edema: Secondary | ICD-10-CM | POA: Diagnosis not present

## 2018-03-01 DIAGNOSIS — E1122 Type 2 diabetes mellitus with diabetic chronic kidney disease: Secondary | ICD-10-CM | POA: Diagnosis not present

## 2018-03-01 DIAGNOSIS — N186 End stage renal disease: Secondary | ICD-10-CM | POA: Diagnosis not present

## 2018-03-01 DIAGNOSIS — Z888 Allergy status to other drugs, medicaments and biological substances status: Secondary | ICD-10-CM

## 2018-03-01 DIAGNOSIS — Z794 Long term (current) use of insulin: Secondary | ICD-10-CM | POA: Diagnosis not present

## 2018-03-01 LAB — RENAL FUNCTION PANEL
ALBUMIN: 2.1 g/dL — AB (ref 3.5–5.0)
Anion gap: 12 (ref 5–15)
BUN: 60 mg/dL — ABNORMAL HIGH (ref 6–20)
CALCIUM: 8.7 mg/dL — AB (ref 8.9–10.3)
CO2: 26 mmol/L (ref 22–32)
CREATININE: 6.68 mg/dL — AB (ref 0.61–1.24)
Chloride: 99 mmol/L (ref 98–111)
GFR calc Af Amer: 10 mL/min — ABNORMAL LOW (ref >60)
GFR calc non Af Amer: 8 mL/min — ABNORMAL LOW (ref >60)
GLUCOSE: 48 mg/dL — AB (ref 70–99)
PHOSPHORUS: 4.3 mg/dL (ref 2.5–4.6)
Potassium: 3.9 mmol/L (ref 3.5–5.1)
Sodium: 137 mmol/L (ref 135–145)

## 2018-03-01 LAB — CBC WITH DIFFERENTIAL/PLATELET
Abs Immature Granulocytes: 0.1 10*3/uL (ref 0.0–0.1)
BASOS ABS: 0.1 10*3/uL (ref 0.0–0.1)
BASOS PCT: 1 %
EOS ABS: 0.1 10*3/uL (ref 0.0–0.7)
EOS PCT: 1 %
HEMATOCRIT: 41.9 % (ref 39.0–52.0)
HEMOGLOBIN: 13.3 g/dL (ref 13.0–17.0)
Immature Granulocytes: 1 %
Lymphocytes Relative: 15 %
Lymphs Abs: 2.2 10*3/uL (ref 0.7–4.0)
MCH: 29.4 pg (ref 26.0–34.0)
MCHC: 31.7 g/dL (ref 30.0–36.0)
MCV: 92.7 fL (ref 78.0–100.0)
Monocytes Absolute: 1.3 10*3/uL — ABNORMAL HIGH (ref 0.1–1.0)
Monocytes Relative: 9 %
Neutro Abs: 10.7 10*3/uL — ABNORMAL HIGH (ref 1.7–7.7)
Neutrophils Relative %: 73 %
Platelets: 110 10*3/uL — ABNORMAL LOW (ref 150–400)
RBC: 4.52 MIL/uL (ref 4.22–5.81)
RDW: 18.4 % — ABNORMAL HIGH (ref 11.5–15.5)
WBC: 14.5 10*3/uL — AB (ref 4.0–10.5)

## 2018-03-01 LAB — PROTIME-INR
INR: 1.09
Prothrombin Time: 14 seconds (ref 11.4–15.2)

## 2018-03-01 LAB — GLUCOSE, CAPILLARY
GLUCOSE-CAPILLARY: 110 mg/dL — AB (ref 70–99)
GLUCOSE-CAPILLARY: 99 mg/dL (ref 70–99)
Glucose-Capillary: 49 mg/dL — ABNORMAL LOW (ref 70–99)
Glucose-Capillary: 72 mg/dL (ref 70–99)

## 2018-03-01 LAB — COMPREHENSIVE METABOLIC PANEL
ALT: 18 U/L (ref 0–44)
AST: 35 U/L (ref 15–41)
Albumin: 2.2 g/dL — ABNORMAL LOW (ref 3.5–5.0)
Alkaline Phosphatase: 301 U/L — ABNORMAL HIGH (ref 38–126)
Anion gap: 14 (ref 5–15)
BILIRUBIN TOTAL: 1.5 mg/dL — AB (ref 0.3–1.2)
BUN: 59 mg/dL — AB (ref 6–20)
CO2: 23 mmol/L (ref 22–32)
CREATININE: 6.72 mg/dL — AB (ref 0.61–1.24)
Calcium: 8.5 mg/dL — ABNORMAL LOW (ref 8.9–10.3)
Chloride: 99 mmol/L (ref 98–111)
GFR calc Af Amer: 10 mL/min — ABNORMAL LOW (ref >60)
GFR calc non Af Amer: 8 mL/min — ABNORMAL LOW (ref >60)
Glucose, Bld: 178 mg/dL — ABNORMAL HIGH (ref 70–99)
Potassium: 4.7 mmol/L (ref 3.5–5.1)
Sodium: 136 mmol/L (ref 135–145)
TOTAL PROTEIN: 6.1 g/dL — AB (ref 6.5–8.1)

## 2018-03-01 LAB — BODY FLUID CELL COUNT WITH DIFFERENTIAL
Eos, Fluid: 1 %
LYMPHS FL: 65 %
MONOCYTE-MACROPHAGE-SEROUS FLUID: 7 % — AB (ref 50–90)
Neutrophil Count, Fluid: 27 % — ABNORMAL HIGH (ref 0–25)
Total Nucleated Cell Count, Fluid: 24 cu mm (ref 0–1000)

## 2018-03-01 LAB — CBC
HCT: 38.2 % — ABNORMAL LOW (ref 39.0–52.0)
Hemoglobin: 12.3 g/dL — ABNORMAL LOW (ref 13.0–17.0)
MCH: 29.1 pg (ref 26.0–34.0)
MCHC: 32.2 g/dL (ref 30.0–36.0)
MCV: 90.3 fL (ref 78.0–100.0)
PLATELETS: 102 10*3/uL — AB (ref 150–400)
RBC: 4.23 MIL/uL (ref 4.22–5.81)
RDW: 17.9 % — AB (ref 11.5–15.5)
WBC: 11.7 10*3/uL — ABNORMAL HIGH (ref 4.0–10.5)

## 2018-03-01 LAB — ALBUMIN, PLEURAL OR PERITONEAL FLUID

## 2018-03-01 LAB — LACTATE DEHYDROGENASE, PLEURAL OR PERITONEAL FLUID: LD, Fluid: 42 U/L — ABNORMAL HIGH (ref 3–23)

## 2018-03-01 LAB — AMMONIA: AMMONIA: 228 umol/L — AB (ref 9–35)

## 2018-03-01 MED ORDER — LIDOCAINE HCL (PF) 1 % IJ SOLN: 5.0000 mL | INTRAMUSCULAR | Status: DC | PRN

## 2018-03-01 MED ORDER — MIDODRINE HCL 5 MG PO TABS
ORAL_TABLET | ORAL | Status: AC
  Administered 2018-03-01: 20 mg via ORAL
  Filled 2018-03-01: qty 1

## 2018-03-01 MED ORDER — SODIUM CHLORIDE 0.9 % IV SOLN
2.0000 g | Freq: Once | INTRAVENOUS | Status: DC
  Filled 2018-03-01: qty 20

## 2018-03-01 MED ORDER — SODIUM CHLORIDE 0.9 % IV SOLN: 100.0000 mL | INTRAVENOUS | Status: DC | PRN

## 2018-03-01 MED ORDER — FENTANYL 25 MCG/HR TD PT72
25.0000 ug | MEDICATED_PATCH | TRANSDERMAL | Status: DC
  Administered 2018-03-01: 25 ug via TRANSDERMAL
  Filled 2018-03-01: qty 1

## 2018-03-01 MED ORDER — MIDODRINE HCL 5 MG PO TABS
10.0000 mg | ORAL_TABLET | ORAL | Status: DC | PRN
  Filled 2018-03-01: qty 2

## 2018-03-01 MED ORDER — CEFTRIAXONE SODIUM 1 G IJ SOLR
1.0000 g | INTRAMUSCULAR | Status: DC
  Administered 2018-03-01: 1 g via INTRAVENOUS
  Filled 2018-03-01 (×2): qty 10

## 2018-03-01 MED ORDER — OXYCODONE HCL 5 MG PO TABS
5.0000 mg | ORAL_TABLET | Freq: Four times a day (QID) | ORAL | Status: DC | PRN
  Administered 2018-03-01 – 2018-03-02 (×3): 5 mg via ORAL
  Filled 2018-03-01 (×3): qty 1

## 2018-03-01 MED ORDER — HYDROMORPHONE HCL 1 MG/ML IJ SOLN
INTRAMUSCULAR | Status: AC
  Administered 2018-03-01: 0.5 mg via INTRAVENOUS
  Filled 2018-03-01: qty 1

## 2018-03-01 MED ORDER — FERRIC CITRATE 1 GM 210 MG(FE) PO TABS
420.0000 mg | ORAL_TABLET | Freq: Three times a day (TID) | ORAL | Status: DC
  Administered 2018-03-01 – 2018-03-02 (×3): 420 mg via ORAL
  Filled 2018-03-01 (×4): qty 2

## 2018-03-01 MED ORDER — RIFAXIMIN 550 MG PO TABS
550.0000 mg | ORAL_TABLET | Freq: Two times a day (BID) | ORAL | Status: DC
  Administered 2018-03-01 – 2018-03-02 (×2): 550 mg via ORAL
  Filled 2018-03-01 (×4): qty 1

## 2018-03-01 MED ORDER — ALBUMIN HUMAN 25 % IV SOLN
50.0000 g | Freq: Once | INTRAVENOUS | Status: AC
  Administered 2018-03-01: 12.5 g via INTRAVENOUS
  Filled 2018-03-01 (×2): qty 200

## 2018-03-01 MED ORDER — MIDODRINE HCL 5 MG PO TABS
20.0000 mg | ORAL_TABLET | Freq: Three times a day (TID) | ORAL | Status: DC
  Administered 2018-03-01 – 2018-03-02 (×4): 20 mg via ORAL
  Filled 2018-03-01 (×5): qty 4

## 2018-03-01 MED ORDER — PENTAFLUOROPROP-TETRAFLUOROETH EX AERO
1.0000 "application " | INHALATION_SPRAY | CUTANEOUS | Status: DC | PRN
  Filled 2018-03-01: qty 103.5

## 2018-03-01 MED ORDER — HYDROMORPHONE HCL 1 MG/ML IJ SOLN
0.5000 mg | Freq: Once | INTRAMUSCULAR | Status: AC
  Administered 2018-03-01: 0.5 mg via INTRAVENOUS

## 2018-03-01 MED ORDER — LACTULOSE 10 GM/15ML PO SOLN
10.0000 g | Freq: Two times a day (BID) | ORAL | Status: DC
  Filled 2018-03-01 (×2): qty 15

## 2018-03-01 MED ORDER — CHLORHEXIDINE GLUCONATE CLOTH 2 % EX PADS: 6.0000 | MEDICATED_PAD | Freq: Every day | CUTANEOUS | Status: DC

## 2018-03-01 MED ORDER — LIDOCAINE-PRILOCAINE 2.5-2.5 % EX CREA
1.0000 "application " | TOPICAL_CREAM | CUTANEOUS | Status: DC | PRN
  Filled 2018-03-01: qty 5

## 2018-03-01 MED ORDER — ALTEPLASE 2 MG IJ SOLR: 2.0000 mg | Freq: Once | INTRAMUSCULAR | Status: DC | PRN

## 2018-03-01 MED ORDER — PANTOPRAZOLE SODIUM 40 MG PO TBEC
40.0000 mg | DELAYED_RELEASE_TABLET | Freq: Every day | ORAL | Status: DC
  Administered 2018-03-01 – 2018-03-02 (×2): 40 mg via ORAL
  Filled 2018-03-01 (×3): qty 1

## 2018-03-01 MED ORDER — HEPARIN SODIUM (PORCINE) 1000 UNIT/ML DIALYSIS
1000.0000 [IU] | INTRAMUSCULAR | Status: DC | PRN
  Filled 2018-03-01: qty 1

## 2018-03-01 MED ORDER — HEPARIN SODIUM (PORCINE) 5000 UNIT/ML IJ SOLN
5000.0000 [IU] | Freq: Three times a day (TID) | INTRAMUSCULAR | Status: DC
  Administered 2018-03-01 (×2): 5000 [IU] via SUBCUTANEOUS
  Filled 2018-03-01 (×2): qty 1

## 2018-03-01 MED ORDER — MIDODRINE HCL 5 MG PO TABS
ORAL_TABLET | ORAL | Status: AC
  Administered 2018-03-01: 20 mg via ORAL
  Filled 2018-03-01: qty 3

## 2018-03-01 NOTE — Consult Note (Addendum)
Renal Service Consult Note [facility]  [name] 03/01/2018 [name] Requesting Physician:  Dr. Rogelia Boga  Reason for Consult:  ESRD pt w/ abd pain/ distension and SOB HPI: The patient is a 57 y.o. year-old w/ hx of etoh abuse, ascites/ cirrhosis, esrd started HD in April 2019 presented to ED early am today w/ SOB.  CXR showed IS pulm edema.  Asked to see for urgent HD, on HD now.    Patient is severely lethargic, arouses w/ some effort and falls back asleep.  Did not provide any history.    Pt w/ long hx etoh abuse, was admitted in March/ April and was in ICU on CRRT for AKI and never recovered renal function was dc'd to OP HD.  Had L AVG placed and is getting HD on MWF schedule.  BP's at HD are usually in 80's - 100's systolic.    Pt was admitted in June for 24 hrs for acute resp failure w/ pulm edema/ vol overload. Had 3L paracentesis and HD and CXR improved.   Pt admitted June 30- Jul 1 w/ SOB and vol overload, O2 sat 82% in ED. Had paracentesis for 4L w/ improvement.   CXR here shows marked ascites, small lung volumes and bilat IS edema.     ROS  denies CP  no joint pain   no HA  no blurry vision  no rash  no diarrhea  no nausea/ vomiting    Past Medical History  Past Medical History:  Diagnosis Date  . Chronic lower back pain   . Compression fracture    lumbar 3  . Diabetes mellitus   . Fracture acetabulum-closed (HCC) 04/09/2013  . GERD (gastroesophageal reflux disease)   . H/O Legionnaire's disease   . Hepatomegaly   . Migraine   . Osteoporosis   . Pancreatitis   . PTSD (post-traumatic stress disorder)   . Vitamin D deficiency    Past Surgical History  Past Surgical History:  Procedure Laterality Date  . AV FISTULA PLACEMENT Left 12/10/2017   Procedure: INSERTION OF ARTERIOVENOUS (AV) GORE-TEX GRAFT LEFT  ARM;  Surgeon: Chuck Hint, MD;  Location: [facility] OR;  Service: Vascular;  Laterality: Left;  . BACK SURGERY    .  CHOLECYSTECTOMY    . HERNIA REPAIR    . IR FLUORO GUIDE CV LINE RIGHT  11/27/2017  . IR US GUIDE VASC ACCESS RIGHT  11/27/2017  . KYPHOPLASTY N/A 07/16/2015   Procedure: Lumbar three kyphoplasty;  Surgeon: Coletta Memos, MD;  Location: [facility];  Service: Neurosurgery;  Laterality: N/A;  Lumbar three kyphoplasty  . LAMINECTOMY    . MASS EXCISION Left 06/12/2017   Procedure: EXCISION LEFT AXILLARY SEBACEOUS CYST;  Surgeon: Abigail Miyamoto, MD;  Location: [facility];  Service: General;  Laterality: Left;  . STERNUM FRACTURE SURGERY     Family History  Family History  Problem Relation Age of Onset  . Cancer Unknown        breast/grandmother, prostate/grandfather   Social History  reports that he quit smoking about 10 years ago. His smoking use included cigarettes. He has a 13.00 pack-year smoking history. He has never used smokeless tobacco. He reports that he drinks about 3.6 oz of alcohol per week. He reports that he does not use drugs. Allergies  Allergies  Allergen Reactions  . Ketorolac Tromethamine Hives  . Temsirolimus Other (See Comments)    Unknown - Pt does not recognize this medication [facility]  Home medications Prior to Admission medications   Medication Sig Start Date End Date Taking? Authorizing Provider  albuterol (PROVENTIL HFA;VENTOLIN HFA) 108 (90 Base) MCG/ACT inhaler Inhale 2 puffs into the lungs every 6 (six) hours as needed for wheezing or shortness of breath. 10/16/17  Yes Remus Loffler, PA-C  Calcium Carbonate Antacid (CALCIUM CARBONATE, DOSED IN MG ELEMENTAL CALCIUM,) 1250 MG/5ML SUSP Take 5 mLs (500 mg of elemental calcium total) by mouth every 6 (six) hours as needed for indigestion. 12/13/17  Yes Elgergawy, Leana Roe, MD  fentaNYL (DURAGESIC - DOSED MCG/HR) 25 MCG/HR patch Place 25 mcg onto the skin every 3 (three) days.   Yes [provider]  ferric citrate (AURYXIA) 1 GM 210 MG(Fe) tablet Take 2 tablets (420 mg total) by  mouth 3 (three) times daily with meals. 02/22/18  Yes Nyra Market, MD  Gauze Pads & Dressings (ABDOMINAL PAD) 8"X10" PADS 1 each by Does not apply route as needed. Use as needed 02/09/18  Yes Dettinger, Elige Radon, MD  Gauze Pads & Dressings [facility] BANDAGE ROLL) MISC Use as needed 02/09/18  Yes Dettinger, Elige Radon, MD  insulin aspart (NOVOLOG FLEXPEN) 100 UNIT/ML FlexPen INJECT 7 TO 15 UNITS subcu up to 4 times daily Patient taking differently: Inject 5-7 Units into the skin 3 (three) times daily with meals.  10/23/17  Yes Dettinger, Elige Radon, MD  Insulin Glargine (LANTUS SOLOSTAR) 100 UNIT/ML Solostar Pen Inject 26 Units into the skin daily. Patient taking differently: Inject 34 Units into the skin daily.  12/13/17  Yes Elgergawy, Leana Roe, MD  lactulose (CHRONULAC) 10 GM/15ML solution Take 15 mLs (10 g total) by mouth 2 (two) times daily. 12/13/17  Yes Elgergawy, Leana Roe, MD  midodrine (PROAMATINE) 10 MG tablet Take 1 tablet (10 mg total) by mouth 3 (three) times daily with meals. Patient taking differently: Take 20 mg by mouth 3 (three) times daily with meals.  12/13/17  Yes Elgergawy, Leana Roe, MD  oxycodone (OXY-IR) 5 MG capsule Take 5 mg by mouth every 4 (four) hours.    Yes [provider]  pantoprazole (PROTONIX) 40 MG tablet Take 1 tablet (40 mg total) by mouth daily. 10/22/17  Yes Dettinger, Elige Radon, MD  rifaximin (XIFAXAN) 550 MG TABS tablet Take 1 tablet (550 mg total) by mouth 2 (two) times daily. 12/13/17  Yes Elgergawy, Leana Roe, MD  Wound Dressings (VASELINE PETROLATUM GAUZE) PADS Use as needed 02/09/18  Yes Dettinger, Elige Radon, MD   Liver Function Tests Recent Labs  Lab 02/22/18 1508 03/01/18 0356 03/01/18 0702  AST  --  35  --   ALT  --  18  --   ALKPHOS  --  301*  --   BILITOT  --  1.5*  --   PROT  --  6.1*  --   ALBUMIN 1.7* 2.2* 2.1*   No results for input(s): LIPASE, AMYLASE in the last 168 hours. CBC Recent Labs  Lab 02/22/18 1508 03/01/18 0356 03/01/18 0702   WBC 7.7 14.5* 11.7*  NEUTROABS  --  10.7*  --   HGB 12.6* 13.3 12.3*  HCT 38.6* 41.9 38.2*  MCV 89.6 92.7 90.3  PLT 69* 110* 102*   Basic Metabolic Panel Recent Labs  Lab 02/22/18 1508 03/01/18 0356 03/01/18 0702  NA 138 136 137  K 3.7 4.7 3.9  CL 103 99 99  CO2 24 23 26   GLUCOSE 206* 178* 48*  BUN 38* 59* 60*  CREATININE 6.40* 6.72* 6.68*  CALCIUM  8.1* 8.5* 8.7*  PHOS 5.2*  --  4.3   Iron/TIBC/Ferritin/ %Sat    Component Value Date/Time   IRON 45 12/06/2017 0625   TIBC 98 (L) 12/06/2017 0625   FERRITIN 230 12/06/2017 0625   IRONPCTSAT 46 (H) 12/06/2017 0625    Vitals:   03/01/18 0830 03/01/18 0900 03/01/18 0915 03/01/18 0930  BP: (!) 106/57 (!) 85/61 (!) 87/62 (!) 85/59  Pulse: 89 98 98 96  Resp: (!) 25 (!) 22 20 15   Temp:      TempSrc:      SpO2:      Weight:      Height:       Exam Gen lethargic, extremely weak, whispers responses, skin is gray above in the chest and face No rash, cyanosis or gangrene Sclera anicteric, throat clear and dry  No jvd or bruits Chest clear bilat RRR no MRG Abd tense ascites 3+, dec'd BS GU normal male MS no joint effusions or deformity Ext sig 3+ bilat lower leg edema w/ some erythema, blistering and one area denuded skin R shin Neuro is lethargic, barely arousable    Home meds:  - lantus 26u / novolog flex pen tid  - rifaximin 500 bid/ lacutulose 15 cc bid  - midodrine 20 tid  - pPI/ auryxia 2 ac tid  - oxy IR 5mg  q 4h prn/ fentanyl patch 25 ug every 72hrs  - alb nebs prn   Dialysis: MWF  4h  69kg  3K/ 2.25 bath  LUA AVG  no meds    Impression: 1  SOB - due to severe ascites and vol overload/ pulm edema 2  Vol overload - severe vol overload and impossible to manage due to low BP's assoc w/ cirrhosis 3  Cirrhosis - w severe ascites  4  Hypotension - on max midodrine at 20 tid 5  FTT - needs pall care meeting w/ family, doing very poorly and will not tolerate dialysis much longer 6  ESRD - usual HD MWF, on  HD now 7  Chronic pain syndrome - not getting any narcotics now, appears to be very uncomfortable but will not tolerate narcotics w/ HD due to negative BP effects   Plan - as above  Vinson Moselleob Kamee Bobst MD [facility] pager [phone]   03/01/2018, 9:44 AM

## 2018-03-01 NOTE — Procedures (Addendum)
Paracentesis Procedure Note  Indications:  Diagnostic and Therapeutic Paracentesis for Large Volume Ascites  Procedure Details  Informed consent was obtained after explanation of the risks and benefits of the procedure, refer to the consent documentation.  Time-out was performed immediately prior to the procedure. The procedure was performed by Dr. Samuella CotaSvalina ([email or web address]) and myself.   The head of the bed was placed 30-45 degrees above level and the meniscus of the ascites was evaluated by bedside ultrasound and a large area of ascitic fluid was identified in the left lower quadrant. We evaluated the abdominal wall using a linear probe, there was no vessel, varix, or doppler signal at the site of the procedure.  Local anesthesia with 1 percent lidocaine was introduced subcutaneously then deep to the skin until the parietal peritoneum was anesthetized. A parcentesis needle was introduced into this site until ascitic fluid was encountered.  Ascitic fluid and the needle were removed with minimal bleeding.  A sterile bandage was placed after holding pressure.   Findings: 6000mL of yellow ascites fluid was obtained.  The ascites fluid was sent for cell count and culture.        Condition:   The patient tolerated the procedure well and remains in the same condition as pre-procedure.  Complications: None; patient tolerated the procedure well.    Short axis view of the left lower quadrant showing large free flowing acites. There was some free floating hyperechoic specs which may be debris, so we sent a sample for cell count.

## 2018-03-01 NOTE — ED Provider Notes (Signed)
[facility] EMERGENCY DEPARTMENT Provider Note  CSN: [id number] Arrival date & time: 03/01/18 0304  Chief Complaint(s) Shortness of Breath  HPI [name] is a 57 y.o. male with extensive past medical history listed below including alcoholic cirrhosis with ascites,  ESRD on dialysis Monday Wednesday and Friday here for gradually worsening abdominal distention and shortness of breath.  Patient was admitted 1/2 weeks ago for similar presentation requiring paracentesis.  Shortness of breath is exacerbated with lying down.  Patient has tried albuterol inhalers with minimal relief.  Endorses dry cough.  No chest pain.  No abdominal pain.  Denies any fevers.   no confusion.   HPI  Past Medical History Past Medical History:  Diagnosis Date  . Chronic lower back pain   . Compression fracture    lumbar 3  . Diabetes mellitus   . Fracture acetabulum-closed (HCC) 04/09/2013  . GERD (gastroesophageal reflux disease)   . H/O Legionnaire's disease   . Hepatomegaly   . Migraine   . Osteoporosis   . Pancreatitis   . PTSD (post-traumatic stress disorder)   . Vitamin D deficiency    Patient Active Problem List   Diagnosis Date Noted  . Decompensation of cirrhosis of liver (HCC) 02/22/2018  . Hepatorenal syndrome (HCC) 02/11/2018  . Decompensated hepatic cirrhosis (HCC) 02/11/2018  . Pain, generalized   . Thrombocytopenia (HCC) 11/25/2017  . Alcohol abuse 11/25/2017  . ESRD (end stage renal disease) on dialysis (HCC)   . Goals of care, counseling/discussion   . Palliative care by specialist   . Acute renal failure (ARF) (HCC)   . Cirrhosis (HCC) 11/07/2017  . Influenza A 10/16/2017  . Closed fracture of second lumbar vertebra (HCC)   . Compression fracture of L3 lumbar vertebra with delayed healing 07/16/2015  . Compression fracture of lumbar vertebra (HCC) 07/16/2015  . Pressure ulcer 01/18/2015  . Acute respiratory failure with hypoxia (HCC)   . Hypoxia   .  Lumbar foraminal stenosis 04/19/2013  . Acetabulum fracture, right (HCC) 04/09/2013  . Type 2 diabetes mellitus (HCC) 04/09/2013  . CIRRHOSIS, ALCOHOLIC 06/12/2010  . LIVER FUNCTION TESTS, ABNORMAL, HX OF 10/02/2009  . PANCREATITIS, HX OF 09/27/2009  . HYPOGONADISM 08/29/2009  . VITAMIN D DEFICIENCY 08/29/2009  . OSTEOPOROSIS 02/22/2009  . Hypertension associated with diabetes (HCC) 02/08/2009  . GERD 02/08/2009  . BACK PAIN, LUMBAR, CHRONIC 02/08/2009  . LEG EDEMA, LEFT 02/08/2009  . Hepatomegaly 02/08/2009   Home Medication(s) Prior to Admission medications   Medication Sig Start Date End Date Taking? Authorizing Provider  albuterol (PROVENTIL HFA;VENTOLIN HFA) 108 (90 Base) MCG/ACT inhaler Inhale 2 puffs into the lungs every 6 (six) hours as needed for wheezing or shortness of breath. 10/16/17   Remus Loffler, PA-C  Calcium Carbonate Antacid (CALCIUM CARBONATE, DOSED IN MG ELEMENTAL CALCIUM,) 1250 MG/5ML SUSP Take 5 mLs (500 mg of elemental calcium total) by mouth every 6 (six) hours as needed for indigestion. 12/13/17   Elgergawy, Leana Roe, MD  fentaNYL (DURAGESIC - DOSED MCG/HR) 25 MCG/HR patch Place 25 mcg onto the skin every 3 (three) days.    [provider]  ferric citrate (AURYXIA) 1 GM 210 MG(Fe) tablet Take 2 tablets (420 mg total) by mouth 3 (three) times daily with meals. 02/22/18   Nyra Market, MD  Gauze Pads & Dressings (ABDOMINAL PAD) 8"X10" PADS 1 each by Does not apply route as needed. Use as needed 02/09/18   Dettinger, Elige Radon, MD  Gauze Pads &  Dressings (KERLIX BANDAGE ROLL) MISC Use as needed 02/09/18   Dettinger, Elige Radon, MD  insulin aspart (NOVOLOG FLEXPEN) 100 UNIT/ML FlexPen INJECT 7 TO 15 UNITS subcu up to 4 times daily Patient taking differently: Inject 5-7 Units into the skin 3 (three) times daily with meals.  10/23/17   Dettinger, Elige Radon, MD  Insulin Glargine (LANTUS SOLOSTAR) 100 UNIT/ML Solostar Pen Inject 26 Units into the skin daily. Patient  taking differently: Inject 35 Units into the skin daily.  12/13/17   Elgergawy, Leana Roe, MD  lactulose (CHRONULAC) 10 GM/15ML solution Take 15 mLs (10 g total) by mouth 2 (two) times daily. 12/13/17   Elgergawy, Leana Roe, MD  midodrine (PROAMATINE) 10 MG tablet Take 1 tablet (10 mg total) by mouth 3 (three) times daily with meals. Patient taking differently: Take 20 mg by mouth 3 (three) times daily with meals.  12/13/17   Elgergawy, Leana Roe, MD  oxycodone (OXY-IR) 5 MG capsule Take 5 mg by mouth every 4 (four) hours as needed for pain.    [provider]  pantoprazole (PROTONIX) 40 MG tablet Take 1 tablet (40 mg total) by mouth daily. 10/22/17   Dettinger, Elige Radon, MD  rifaximin (XIFAXAN) 550 MG TABS tablet Take 1 tablet (550 mg total) by mouth 2 (two) times daily. 12/13/17   Elgergawy, Leana Roe, MD  Wound Dressings (VASELINE PETROLATUM GAUZE) PADS Use as needed 02/09/18   Dettinger, Elige Radon, MD                                                                                                                                    Past Surgical History Past Surgical History:  Procedure Laterality Date  . AV FISTULA PLACEMENT Left 12/10/2017   Procedure: INSERTION OF ARTERIOVENOUS (AV) GORE-TEX GRAFT LEFT  ARM;  Surgeon: Chuck Hint, MD;  Location: [facility] OR;  Service: Vascular;  Laterality: Left;  . BACK SURGERY    . CHOLECYSTECTOMY    . HERNIA REPAIR    . IR FLUORO GUIDE CV LINE RIGHT  11/27/2017  . IR US GUIDE VASC ACCESS RIGHT  11/27/2017  . KYPHOPLASTY N/A 07/16/2015   Procedure: Lumbar three kyphoplasty;  Surgeon: Coletta Memos, MD;  Location: [facility];  Service: Neurosurgery;  Laterality: N/A;  Lumbar three kyphoplasty  . LAMINECTOMY    . MASS EXCISION Left 06/12/2017   Procedure: EXCISION LEFT AXILLARY SEBACEOUS CYST;  Surgeon: Abigail Miyamoto, MD;  Location: [facility];  Service: General;  Laterality: Left;  . STERNUM FRACTURE SURGERY     Family  History Family History  Problem Relation Age of Onset  . Cancer Unknown        breast/grandmother, prostate/grandfather    Social History Social History   Tobacco Use  . Smoking status: Former Smoker    Packs/day: 0.50    Years: 26.00    Pack years: 13.00    Types: Cigarettes  Last attempt to quit: 08/26/2007    Years since quitting: 10.5  . Smokeless tobacco: Never Used  Substance Use Topics  . Alcohol use: Yes    Alcohol/week: 3.6 oz    Types: 6 Glasses of wine per week    Comment: 6 oz x 6 glass of wine daily   . Drug use: No   Allergies Ketorolac tromethamine and Temsirolimus  Review of Systems Review of Systems All other systems are reviewed and are negative for acute change except as noted in the HPI  Physical Exam Vital Signs  I have reviewed the triage vital signs BP 96/73   Pulse 86   Temp 97.8 F (36.6 C) (Oral)   Resp 10   Ht 5\' 8"  (1.727 m)   Wt 70.8 kg (156 lb)   SpO2 87%  BMI 23.72 kg/m   Physical Exam  Constitutional: He is oriented to person, place, and time. He appears well-developed and well-nourished. No distress.  HENT:  Head: Normocephalic and atraumatic.  Nose: Nose normal.  Eyes: Pupils are equal, round, and reactive to light. Conjunctivae and EOM are normal. Right eye exhibits no discharge. Left eye exhibits no discharge. No scleral icterus.  Disconjugate gaze  Neck: Normal range of motion. Neck supple.  Cardiovascular: Normal rate and regular rhythm. Exam reveals no gallop and no friction rub.  No murmur heard. Pulmonary/Chest: Effort normal. No stridor. No respiratory distress. He has no rhonchi. He has rales in the right lower field and the left lower field.  Abdominal: Soft. He exhibits distension and ascites. There is no tenderness.  Genitourinary:  Genitourinary Comments: Scrotal swelling  Musculoskeletal: He exhibits no edema or tenderness.  3+ bilateral lower extremity edema  Neurological: He is alert and oriented to  person, place, and time.  Skin: Skin is warm and dry. No rash noted. He is not diaphoretic. No erythema.  Psychiatric: He has a normal mood and affect.  Vitals reviewed.   ED Results and Treatments Labs (all labs ordered are listed, but only abnormal results are displayed) Labs Reviewed  COMPREHENSIVE METABOLIC PANEL - Abnormal; Notable for the following components:      Result Value   Glucose, Bld 178 (*)    BUN 59 (*)    Creatinine, Ser 6.72 (*)    Calcium 8.5 (*)    Total Protein 6.1 (*)    Albumin 2.2 (*)    Alkaline Phosphatase 301 (*)    Total Bilirubin 1.5 (*)    GFR calc non Af Amer 8 (*)    GFR calc Af Amer 10 (*)    All other components within normal limits  CBC WITH DIFFERENTIAL/PLATELET - Abnormal; Notable for the following components:   WBC 14.5 (*)    RDW 18.4 (*)    Platelets 110 (*)    Neutro Abs 10.7 (*)    Monocytes Absolute 1.3 (*)    All other components within normal limits  EKG  EKG Interpretation  Date/Time:  Monday March 01 2018 03:30:44 EDT Ventricular Rate:  103 PR Interval:    QRS Duration: 91 QT Interval:  341 QTC Calculation: 447 R Axis:   98 Text Interpretation:  Sinus tachycardia Borderline right axis deviation Borderline T abnormalities, diffuse leads NO STEMI improved artifact Otherwise no significant change Confirmed by Drema Pry [phone]) on 03/01/2018 5:38:50 AM      Radiology Dg Chest Port 1 View  Result Date: 03/01/2018 CLINICAL DATA:  Initial evaluation for acute shortness of breath. EXAM: PORTABLE CHEST 1 VIEW COMPARISON:  Prior radiograph from 02/21/2018 FINDINGS: Stable cardiomegaly. Mild widening of the mediastinal silhouette most likely related to AP technique and shallow lung inflation. Lungs hypoinflated. Diffuse vascular congestion with mild interstitial prominence, suggesting mild pulmonary interstitial  edema. Superimposed bibasilar atelectatic changes. No definite focal infiltrates. No pneumothorax. No acute osseus abnormality. Remote bilateral rib fractures noted. Osteopenia. IMPRESSION: 1. Stable cardiomegaly with moderate diffuse pulmonary interstitial edema. 2. Low lung volumes with superimposed bibasilar atelectasis. Electronically Signed   By: Rise Mu M.D.   On: 03/01/2018 04:49   Pertinent labs & imaging results that were available during my care of the patient were reviewed by me and considered in my medical decision making (see chart for details).  Medications Ordered in ED Medications - No data to display                                                                                                                                  Procedures Procedures  (including critical care time)  Medical Decision Making / ED Course I have reviewed the nursing notes for this encounter and the patient's prior records (if available in EHR or on provided paperwork).    Patient is afebrile.  Exam with evidence of volume overload.  Satting in the low 80s on room air requiring supplemental oxygen.  Work of breathing did improve after supplemental oxygen.  Abdomen is distended but nontender.  Chest x-ray with bibasilar infiltrates most consistent with pulmonary edema.  Patient does have leukocytosis and will be covered for pneumonia.  Rest of the labs close to patient's baseline and reassuring.  Given hypoxia, will discuss case with medicine for admission to have dialysis and paracentesis.  Nephrology consulted.    Final Clinical Impression(s) / ED Diagnoses Final diagnoses:  SOB (shortness of breath)  Other hypervolemia  Acute pulmonary edema (HCC)  Hypoxia      This chart was dictated using voice recognition software.  Despite best efforts to proofread,  errors can occur which can change the documentation meaning.   Nira Conn, MD 03/01/18 0800

## 2018-03-01 NOTE — Procedures (Signed)
   I was present at this dialysis session, have reviewed the session itself and made  appropriate changes Vinson Moselleob Londen Lorge MD [facility] pager [phone]   03/01/2018, 10:08 AM

## 2018-03-01 NOTE — ED Triage Notes (Signed)
C/o sob.  Unable to tell how long just moaning out.  Per wife patient needs fluid drained off his abdomen again.  Abdomen noted to be tight and distended.  Patient is jaundiced.

## 2018-03-01 NOTE — Progress Notes (Signed)
Inpatient Diabetes Program Recommendations  AACE/ADA: New Consensus Statement on Inpatient Glycemic Control (2015)  Target Ranges:  Prepandial:   less than 140 mg/dL      Peak postprandial:   less than 180 mg/dL (1-2 hours)      Critically ill patients:  140 - 180 mg/dL   Results for [name], [name] (MRN [id number]) as of 03/01/2018 16:07  Ref. Range 03/01/2018 12:59 03/01/2018 13:58  Glucose-Capillary Latest Ref Range: 70 - 99 mg/dL 49 (L) 72   Review of Glycemic Control  Diabetes history: DM 2 Outpatient Diabetes medications: Lantus 24 units Daily, Novolog 5-7 units tid Current orders for Inpatient glycemic control:  None  Inpatient Diabetes Program Recommendations:    Patient having hypoglycemia in the 40's. When trends get above 150 may consider Novolog Correction scale 0-9 units tid, When Glucose gets consistently above 180 mg/dl may consider part of home basal insulin, Lantus 8-10 units.   Thanks,  Christena DeemShannon Morgane Joerger RN, MSN, BC-ADM, [facility] Inpatient Diabetes Coordinator Team Pager [phone] (8a-5p)

## 2018-03-01 NOTE — Telephone Encounter (Signed)
Pt no showed appt on 02/26/2018

## 2018-03-01 NOTE — ED Notes (Signed)
HD RN agreeable to draw blood cultures and labs when pt gets to HD. Admitting provider wants antibiotic given during HD instead of before. RN will send antibiotic with pt to HD.

## 2018-03-01 NOTE — H&P (Signed)
Date: 03/01/2018               Patient Name:  [name] MRN: [id number]  DOB: March 30, 1961 Age / Sex: 57 y.o., male   PCP: Dettinger, Elige Radon, MD         Medical Service: Internal Medicine Teaching Service         Attending Physician: Dr. Burns Spain, MD    First Contact: Dr. Dortha Schwalbe Pager: [phone]  Second Contact: Dr. Samuella Cota  Pager: [phone]       After Hours (After 5p/  First Contact Pager: [phone]  weekends / holidays): Second Contact Pager: [phone]   Chief Complaint: abdominal distention and shortness of breath   History of Present Illness:   49M with PMH ESRD on HD (MWF), decompensated cirrhosis with ascites, hypotension, and DM on insulin who presented to the ED with gradually worsening abdominal distention and shortness of breath. The patient was hospitalized 6/21 for volume overload and decompensated cirrhosis with ascites. A therapeutic paracentesis was performed and 3 liters were removed. He returned to the hospital 6/29 with recurrence of his symptoms. A paracentesis was performed with 4 liters removed and his symptoms resolved again. He was scheduled for outpatient follow up visit at the internal medicine center for probable paracentesis but did not make it to that appointment. The fluid and shortness of breath have returned gradually over the past week. His wife notes that he has started to develop red spots on his extremities. On 7/2 he was evaluated by the transplant team at [facility] At that time it was felt that he would be too frail for dual organ transplant. Physical therapy and a plan for improved nutrition where discussed and he was scheduled for a reassessment in 2 months.   Meds:  Current Meds  Medication Sig  . albuterol (PROVENTIL HFA;VENTOLIN HFA) 108 (90 Base) MCG/ACT inhaler Inhale 2 puffs into the lungs every 6 (six) hours as needed for wheezing or shortness of breath.  . Calcium Carbonate Antacid (CALCIUM CARBONATE, DOSED IN MG ELEMENTAL  CALCIUM,) 1250 MG/5ML SUSP Take 5 mLs (500 mg of elemental calcium total) by mouth every 6 (six) hours as needed for indigestion.  . fentaNYL (DURAGESIC - DOSED MCG/HR) 25 MCG/HR patch Place 25 mcg onto the skin every 3 (three) days.  . ferric citrate (AURYXIA) 1 GM 210 MG(Fe) tablet Take 2 tablets (420 mg total) by mouth 3 (three) times daily with meals.  . Gauze Pads & Dressings (ABDOMINAL PAD) 8"X10" PADS 1 each by Does not apply route as needed. Use as needed  . Gauze Pads & Dressings (KERLIX BANDAGE ROLL) MISC Use as needed  . insulin aspart (NOVOLOG FLEXPEN) 100 UNIT/ML FlexPen INJECT 7 TO 15 UNITS subcu up to 4 times daily (Patient taking differently: Inject 5-7 Units into the skin 3 (three) times daily with meals. )  . Insulin Glargine (LANTUS SOLOSTAR) 100 UNIT/ML Solostar Pen Inject 26 Units into the skin daily. (Patient taking differently: Inject 34 Units into the skin daily. )  . lactulose (CHRONULAC) 10 GM/15ML solution Take 15 mLs (10 g total) by mouth 2 (two) times daily.  . midodrine (PROAMATINE) 10 MG tablet Take 1 tablet (10 mg total) by mouth 3 (three) times daily with meals. (Patient taking differently: Take 20 mg by mouth 3 (three) times daily with meals. )  . oxycodone (OXY-IR) 5 MG capsule Take 5 mg by mouth every 4 (four) hours.   . pantoprazole (PROTONIX) 40 MG tablet  Take 1 tablet (40 mg total) by mouth daily.  . rifaximin (XIFAXAN) 550 MG TABS tablet Take 1 tablet (550 mg total) by mouth 2 (two) times daily.  . Wound Dressings (VASELINE PETROLATUM GAUZE) PADS Use as needed     Allergies: Allergies as of 03/01/2018 - Review Complete 03/01/2018  Allergen Reaction Noted  . Ketorolac tromethamine Hives 03/04/2011  . Temsirolimus Other (See Comments) 03/20/2016   Past Medical History:  Diagnosis Date  . Chronic lower back pain   . Compression fracture    lumbar 3  . Diabetes mellitus   . Fracture acetabulum-closed (HCC) 04/09/2013  . GERD (gastroesophageal reflux  disease)   . H/O Legionnaire's disease   . Hepatomegaly   . Migraine   . Osteoporosis   . Pancreatitis   . PTSD (post-traumatic stress disorder)   . Vitamin D deficiency     Family History:  Grandmother - breast cancer  Grandfather - prostate cancer   Social History:  [name] was in the [employer] for 11 years. Now he is retired. He is married, his wife is a Public house managerlocal cashier. In the past he drank 2 glasses of wine nightly and has a 13 pack year smoking history. He denies illicit substance use.   Review of Systems: A complete ROS was negative except as per HPI.   Physical Exam: Blood pressure 98/71, pulse 78, temperature 97.8 F (36.6 C), temperature source Oral, resp. rate 17, height 5\' 8"  (1.727 m), weight 156 lb (70.8 kg), SpO2 92 %. General: chronically ill appearing, non diaphoretic, not in acute distress  HEENT: scleral icterus, no conjunctival erythema, mucosal erythema  Neck: no masses appreciated  Cardiac: regular rate and rhythm, normal S1 and S2, no murmur appreciated, trace lower extremity edema  Pulm: normal work of breathing, diffuse course rhonchi  Abd: BS+, the abdomen is distended and diffusely tender  Skin: jaundice, palmar erythema, dilated veins over the abdomen, darkened and thickened skin of the lower extremities  Neuro: oriented x 3, asterixis   EKG: personally reviewed my interpretation is sinus rhythm, rate 102, t waves appear flattened in the lateral leads   CXR: personally reviewed my interpretation is the abdomen is distended with fluid, the cardiac borders and lower lung borders are not appreciable due to ascitic fluid   Assessment & Plan by Problem: Active Problems:   Decompensated hepatic cirrhosis (HCC)  Decompensated cirrhosis with ascites  [name] is a 57 year old man with histoyr of ESRD on HD (MWF), decompensated cirrhosis with recurrent ascites, HTN, and DM who presented to the ED with progressive shortness of breath and abdominal  distention in the setting of ascites from decompensated cirrhosis. He was afebrile with initial vital signs checked but does have a new leukocytosis. - would obtain diagnostic and therapeutic paracentesis after HD - would start SBP prophylaxis after we have the ascitic fluid samples  - follow up blood culture results  - Prior hepatitis B serologies are consistent with non immunity  ESRD on HD MWF   Began HD in March 2019. Last HD 7/5. Volume overload on exam today, K 4.7, hypotensive.  - Going for urgent HD today   Type 2 Diabetes  Home meds include lantus 26 units qd and novolog 7-15 units QID. Glucose was found to be profoundly low upon arrival to HD. Last A1c 11/2017 4.4.  - CBG monitoring  - can resume home lantus at 18 units qd if his blood glucose improves   Hypotension  - Continue  home midodrine 20 mg TID  Dispo: Admit patient to Observation with expected length of stay less than 2 midnights.  Signed: Eulah Pont, MD 03/01/2018, 8:06 AM  Pager: [phone]

## 2018-03-01 NOTE — ED Notes (Signed)
Pt transported to HD with RN

## 2018-03-01 NOTE — Progress Notes (Signed)
Daily Progress Note   Patient Name: [name]       Date: 03/01/2018 DOB: 11-17-1960  Age: 57 y.o. MRN#: [id number] Attending Physician: Burns Spain, MD Primary Care Physician: Dettinger, Elige Radon, MD Admit Date: 03/01/2018  Reason for Consultation/Follow-up: Establishing goals of care  Subjective: Patient lethargic lying in bed s/p HD. Awaken when name call and falls back to sleep. When asked his name and dob he opens eyes and again falls back to sleep. Did not awaken during my assessment. Continues to moan and groan. No family at bedside. Was scheduled to meet with wife who unfortunately was unable to get here due to oversleeping. When I called her she was very apologetic and states it would be later today before she arrived and rescheduled goals of care meeting for tomorrow morning 7/9 @ 1000. I did explain to her the severity of his condition. Wife was tearful and stated she knows he doesn't have long to live. We had a brief discussion in regards to patient's code status. She stated he has said in the past when he was more alert; that he would not want to be re-intubated or placed on life-support. Wife states only interventions in an emergency situation they would like to attempt is BiPAP. She does not want ACLS medications, CPR, intubation, or shocks based on the wishes he has discussed with her. Support offered and wife was advised that I would change code status orders in the computer to specify BiPAP only in an emergency situation. She verbalized understanding and agreement.   Length of Stay: 0  Current Medications: Scheduled Meds:  . Chlorhexidine Gluconate Cloth  6 each Topical Q0600  . ferric citrate  420 mg Oral TID WC  . heparin  5,000 Units Subcutaneous Q8H  . lactulose   10 g Oral BID  . midodrine      . midodrine  20 mg Oral TID WC  . pantoprazole  40 mg Oral Daily  . rifaximin  550 mg Oral BID    Continuous Infusions: . sodium chloride    . sodium chloride      PRN Meds: sodium chloride, sodium chloride, alteplase, heparin, lidocaine (PF), lidocaine-prilocaine, midodrine, pentafluoroprop-tetrafluoroeth  Physical Exam  Constitutional: He appears lethargic. He has a sickly appearance.  Fragile in appearance   Cardiovascular: Normal  heart sounds. Exam reveals decreased pulses.  Pulmonary/Chest: He has decreased breath sounds.  Abdominal: He exhibits ascites. There is tenderness.  Genitourinary: Right testis shows swelling. Left testis shows swelling.  Musculoskeletal:  Some lower extremity edema   Neurological: He appears lethargic.  Skin:  Jaundice and ashen grey in appearance   Psychiatric: He expresses inappropriate judgment.  Nursing note and vitals reviewed.           Vital Signs: BP 94/68 (BP Location: Right Arm)   Pulse 86   Temp 97.9 F (36.6 C)   Resp 18   Ht 5\' 8"  (1.727 m)   Wt 70.8 kg (156 lb)   SpO2 95%   BMI 23.72 kg/m  SpO2: SpO2: 95 % O2 Device: O2 Device: Nasal Cannula O2 Flow Rate: O2 Flow Rate (L/min): 2 L/min  Intake/output summary: No intake or output data in the 24 hours ending 03/01/18 1622 LBM:   Baseline Weight: Weight: 70.8 kg (156 lb) Most recent weight: Weight: (pt on stretcher)       Palliative Assessment/Data:PPS 20%   Patient Active Problem List   Diagnosis Date Noted  . Decompensation of cirrhosis of liver (HCC) 02/22/2018  . Hepatorenal syndrome (HCC) 02/11/2018  . Decompensated hepatic cirrhosis (HCC) 02/11/2018  . Pain, generalized   . Thrombocytopenia (HCC) 11/25/2017  . Alcohol abuse 11/25/2017  . ESRD (end stage renal disease) on dialysis (HCC)   . Goals of care, counseling/discussion   . Palliative care by specialist   . Acute renal failure (ARF) (HCC)   . Cirrhosis (HCC)  11/07/2017  . Influenza A 10/16/2017  . Closed fracture of second lumbar vertebra (HCC)   . Compression fracture of L3 lumbar vertebra with delayed healing 07/16/2015  . Compression fracture of lumbar vertebra (HCC) 07/16/2015  . Pressure ulcer 01/18/2015  . Acute respiratory failure with hypoxia (HCC)   . Hypoxia   . Lumbar foraminal stenosis 04/19/2013  . Acetabulum fracture, right (HCC) 04/09/2013  . Type 2 diabetes mellitus (HCC) 04/09/2013  . CIRRHOSIS, ALCOHOLIC 06/12/2010  . LIVER FUNCTION TESTS, ABNORMAL, HX OF 10/02/2009  . PANCREATITIS, HX OF 09/27/2009  . HYPOGONADISM 08/29/2009  . VITAMIN D DEFICIENCY 08/29/2009  . OSTEOPOROSIS 02/22/2009  . Hypertension associated with diabetes (HCC) 02/08/2009  . GERD 02/08/2009  . BACK PAIN, LUMBAR, CHRONIC 02/08/2009  . LEG EDEMA, LEFT 02/08/2009  . Hepatomegaly 02/08/2009    Palliative Care Assessment & Plan   Patient Profile:  [name] is a 57 yo Caucasian Male who was admitted on 03/01/18 with complaints of shortness of breath and abdominal distention. He has a past medical history significant for ESRD (HD M/W/F), alcoholic cirrhosis with ascites, diabetes, GERD, osteoporosis, PTSD, and chronic lower back pain. He was recently admitted a week ago for similar presentation and required a paracentesis (4L) prior to that he was admitted in June and had a paracentesis which yielded 3L. He complains of shortness of breath exacerbation with lying down. He reports minimal relief with use of albuterol inhalers. He denied chest pain, fevers, confusion, but endorses a dry cough. He requires supplemental oxygen due to low saturations in the 80s. Chest x-ray showed bibasilar infiltrates most consistent with pulmonary edema.  Patient does have leukocytosis. Since admission he has been seen by Nephrology and received HD. Palliative Medicine team consulted for goals of care discussion.   Recommendations/Plan:  Partial Code after conversation with  wife. The only emergent interventions patient would like is the use of BiPAP. Wife confirmed they would  not want CPR, shock, ACLS medications, or intubation.   Goals of care discussion re-scheduled for 7/9 @ 1000. More detailed note and recommendations to follow.   Palliative Medicine team will continue to support patient, family, and medical team during hospitalization.   Goals of Care and Additional Recommendations:  Limitations on Scope of Treatment: Full Scope Treatment  Code Status:    Code Status Orders  (From admission, onward)        Start     Ordered   03/01/18 1438  Limited resuscitation (code)  Continuous    Question Answer Comment  In the event of cardiac or respiratory ARREST: Initiate Code Blue, Call Rapid Response Yes   In the event of cardiac or respiratory ARREST: Perform CPR No   In the event of cardiac or respiratory ARREST: Perform Intubation/Mechanical Ventilation No   In the event of cardiac or respiratory ARREST: Use NIPPV/BiPAp only if indicated Yes   In the event of cardiac or respiratory ARREST: Administer ACLS medications if indicated No   In the event of cardiac or respiratory ARREST: Perform Defibrillation or Cardioversion if indicated No      03/01/18 1437    Code Status History    Date Active Date Inactive Code Status Order ID Comments User Context   03/01/2018 0821 03/01/2018 1437 Full Code [id number]  Eulah PontBlum, Nina, MD ED   02/22/2018 0026 02/23/2018 0016 Full Code [id number]  Levora DredgeHelberg, Justin, MD ED   02/11/2018 1548 02/12/2018 2222 Partial Code [id number]  Molt, Bethany, DO ED   11/20/2017 1722 12/13/2017 2123 Partial Code [id number] Pressors ok Bullard, Ronaldo MiyamotoSarah Grace, NP Inpatient   11/19/2017 1018 11/20/2017 1722 Partial Code [id number] Pressors ok Kalman Shanamaswamy, Murali, MD Inpatient   11/12/2017 2159 11/19/2017 1018 Full Code [id number]  Leslye PeerByrum, Robert S, MD Inpatient   11/12/2017 2159 11/12/2017 2159 Partial Code [id number]  Leslye PeerByrum, Robert S, MD Inpatient   11/07/2017 1704  11/12/2017 2159 Full Code [id number]  Minor, Vilinda BlanksWilliam S, NP ED   10/16/2017 1647 10/18/2017 1631 Full Code [id number]  Isaiah BlakesKyazimova, Marina S, PA-C ED   11/29/2016 0118 12/01/2016 1956 Full Code [id number]  Hillary BowGardner, Jared M, DO ED   07/16/2015 1144 07/16/2015 2152 Full Code [id number]  Coletta Memosabbell, Kyle, MD Inpatient   01/11/2015 0007 01/25/2015 1815 Full Code [id number]  Kathlene Coteesai, Rahul P, PA-C ED   04/09/2013 2008 04/19/2013 2001 Full Code [id number]  Drema DallasWoods, Curtis J, MD Inpatient      Prognosis:   Unable to determine-poor in the setting of cirrhosis with severe reoccurring ascites, hypotension on max midodrine, ESRD on HD, chronic pain syndrome, volume overload, shortness of breath, poor po intake, decreased mobility, lethargy, and malnutrition/failure to thrive.   Discharge Planning:  To Be Determined  Care plan was discussed with patient's wife.   Thank you for allowing the Palliative Medicine Team to assist in the care of this patient.   Total Time 45 min.  Prolonged Time Billed  NO       Greater than 50%  of this time was spent counseling and coordinating care related to the above assessment and plan.  Willette AlmaNikki Pickenpack-Cousar, NP-BC Palliative Medicine Team  Phone: [phone] Fax: [phone] Pager: [phone] Amion: Thea AlkenN. Cousar   Please contact Palliative Medicine Team phone at [phone] for questions and concerns.

## 2018-03-01 NOTE — ED Notes (Signed)
The pt has a distended abdomen  His wife reports that he has had difficulty breathing all night.  He has had frequent  Withdrawal of fluid from his abdomen   He is a hemo-dialysis pt fistula in his lt arm

## 2018-03-02 ENCOUNTER — Ambulatory Visit: Payer: Medicare HMO

## 2018-03-02 ENCOUNTER — Encounter (HOSPITAL_COMMUNITY): Payer: Self-pay | Admitting: Physician Assistant

## 2018-03-02 ENCOUNTER — Observation Stay (HOSPITAL_COMMUNITY): Payer: Medicare HMO

## 2018-03-02 ENCOUNTER — Other Ambulatory Visit: Payer: Self-pay

## 2018-03-02 DIAGNOSIS — R0902 Hypoxemia: Secondary | ICD-10-CM | POA: Diagnosis not present

## 2018-03-02 DIAGNOSIS — Z7189 Other specified counseling: Secondary | ICD-10-CM | POA: Diagnosis not present

## 2018-03-02 DIAGNOSIS — K7031 Alcoholic cirrhosis of liver with ascites: Secondary | ICD-10-CM

## 2018-03-02 DIAGNOSIS — K7290 Hepatic failure, unspecified without coma: Secondary | ICD-10-CM | POA: Diagnosis not present

## 2018-03-02 DIAGNOSIS — Z794 Long term (current) use of insulin: Secondary | ICD-10-CM | POA: Diagnosis not present

## 2018-03-02 DIAGNOSIS — I120 Hypertensive chronic kidney disease with stage 5 chronic kidney disease or end stage renal disease: Secondary | ICD-10-CM | POA: Diagnosis not present

## 2018-03-02 DIAGNOSIS — J810 Acute pulmonary edema: Secondary | ICD-10-CM | POA: Diagnosis not present

## 2018-03-02 DIAGNOSIS — Z992 Dependence on renal dialysis: Secondary | ICD-10-CM | POA: Diagnosis not present

## 2018-03-02 DIAGNOSIS — Z515 Encounter for palliative care: Secondary | ICD-10-CM | POA: Diagnosis not present

## 2018-03-02 DIAGNOSIS — R188 Other ascites: Secondary | ICD-10-CM | POA: Diagnosis not present

## 2018-03-02 DIAGNOSIS — J811 Chronic pulmonary edema: Secondary | ICD-10-CM | POA: Diagnosis not present

## 2018-03-02 DIAGNOSIS — Z66 Do not resuscitate: Secondary | ICD-10-CM

## 2018-03-02 DIAGNOSIS — R0602 Shortness of breath: Secondary | ICD-10-CM | POA: Diagnosis not present

## 2018-03-02 DIAGNOSIS — N186 End stage renal disease: Secondary | ICD-10-CM | POA: Diagnosis not present

## 2018-03-02 DIAGNOSIS — E8779 Other fluid overload: Secondary | ICD-10-CM | POA: Diagnosis not present

## 2018-03-02 DIAGNOSIS — E1122 Type 2 diabetes mellitus with diabetic chronic kidney disease: Secondary | ICD-10-CM | POA: Diagnosis not present

## 2018-03-02 DIAGNOSIS — I959 Hypotension, unspecified: Secondary | ICD-10-CM | POA: Diagnosis not present

## 2018-03-02 HISTORY — PX: IR PERC TUN PERIT CATH WO PORT S&I /IMAG: IMG2327

## 2018-03-02 LAB — PATHOLOGIST SMEAR REVIEW

## 2018-03-02 LAB — BASIC METABOLIC PANEL
Anion gap: 12 (ref 5–15)
BUN: 33 mg/dL — AB (ref 6–20)
CHLORIDE: 98 mmol/L (ref 98–111)
CO2: 27 mmol/L (ref 22–32)
CREATININE: 4.92 mg/dL — AB (ref 0.61–1.24)
Calcium: 8.4 mg/dL — ABNORMAL LOW (ref 8.9–10.3)
GFR calc Af Amer: 14 mL/min — ABNORMAL LOW (ref >60)
GFR calc non Af Amer: 12 mL/min — ABNORMAL LOW (ref >60)
Glucose, Bld: 169 mg/dL — ABNORMAL HIGH (ref 70–99)
Potassium: 3.5 mmol/L (ref 3.5–5.1)
Sodium: 137 mmol/L (ref 135–145)

## 2018-03-02 LAB — CBC
HCT: 36.7 % — ABNORMAL LOW (ref 39.0–52.0)
Hemoglobin: 11.6 g/dL — ABNORMAL LOW (ref 13.0–17.0)
MCH: 29.2 pg (ref 26.0–34.0)
MCHC: 31.6 g/dL (ref 30.0–36.0)
MCV: 92.4 fL (ref 78.0–100.0)
Platelets: 69 K/uL — ABNORMAL LOW (ref 150–400)
RBC: 3.97 MIL/uL — ABNORMAL LOW (ref 4.22–5.81)
RDW: 18.2 % — ABNORMAL HIGH (ref 11.5–15.5)
WBC: 7.3 K/uL (ref 4.0–10.5)

## 2018-03-02 LAB — GLUCOSE, CAPILLARY
GLUCOSE-CAPILLARY: 335 mg/dL — AB (ref 70–99)
Glucose-Capillary: 316 mg/dL — ABNORMAL HIGH (ref 70–99)
Glucose-Capillary: 283 mg/dL — ABNORMAL HIGH (ref 70–99)

## 2018-03-02 MED ORDER — OXYCODONE HCL 5 MG PO CAPS: 5.0000 mg | ORAL_CAPSULE | ORAL | 0 refills | Status: AC

## 2018-03-02 MED ORDER — MIDAZOLAM HCL 2 MG/2ML IJ SOLN
INTRAMUSCULAR | Status: AC | PRN
  Administered 2018-03-02 (×2): 1 mg via INTRAVENOUS

## 2018-03-02 MED ORDER — FENTANYL CITRATE (PF) 100 MCG/2ML IJ SOLN
INTRAMUSCULAR | Status: AC
  Filled 2018-03-02: qty 4

## 2018-03-02 MED ORDER — GLYCOPYRROLATE 1 MG PO TABS: 1.0000 mg | ORAL_TABLET | Freq: Four times a day (QID) | ORAL | 0 refills | Status: AC | PRN

## 2018-03-02 MED ORDER — LORAZEPAM 0.5 MG PO TABS: 0.5000 mg | ORAL_TABLET | Freq: Four times a day (QID) | ORAL | 0 refills | Status: AC | PRN

## 2018-03-02 MED ORDER — MIDAZOLAM HCL 2 MG/2ML IJ SOLN
INTRAMUSCULAR | Status: AC
  Filled 2018-03-02: qty 4

## 2018-03-02 MED ORDER — OXYCODONE HCL 5 MG PO TABS
5.0000 mg | ORAL_TABLET | ORAL | Status: DC | PRN
  Administered 2018-03-02: 10 mg via ORAL
  Filled 2018-03-02: qty 2

## 2018-03-02 MED ORDER — ONDANSETRON HCL 4 MG PO TABS: 4.0000 mg | ORAL_TABLET | Freq: Three times a day (TID) | ORAL | 0 refills | Status: AC | PRN

## 2018-03-02 MED ORDER — HEPARIN SODIUM (PORCINE) 5000 UNIT/ML IJ SOLN: 5000.0000 [IU] | Freq: Three times a day (TID) | INTRAMUSCULAR | Status: DC

## 2018-03-02 MED ORDER — FENTANYL CITRATE (PF) 100 MCG/2ML IJ SOLN
INTRAMUSCULAR | Status: AC | PRN
  Administered 2018-03-02 (×2): 50 ug via INTRAVENOUS

## 2018-03-02 MED ORDER — CEFAZOLIN SODIUM-DEXTROSE 1-4 GM/50ML-% IV SOLN
1.0000 g | Freq: Once | INTRAVENOUS | Status: DC
  Filled 2018-03-02: qty 50

## 2018-03-02 MED ORDER — CEFAZOLIN SODIUM-DEXTROSE 2-4 GM/100ML-% IV SOLN
INTRAVENOUS | Status: AC
  Filled 2018-03-02: qty 100

## 2018-03-02 NOTE — Progress Notes (Signed)
MEDICATION-RELATED CONSULT NOTE   IR Procedure Consult - Anticoagulant/Antiplatelet PTA/Inpatient Med List Review by Pharmacist   Procedure: tunneled peritoneal catheter placement    Completed: 1550  Post-Procedural bleeding risk per IR MD assessment:  low  Antithrombotic medications on inpatient or PTA profile prior to procedure:   subq heparin   Recommended restart time per IR Post-Procedure Guidelines:   Day 0- at least 4 hours or next standard interval  Plan:     Resume subq heparin 5000 units starting tonight at 2200  [name] D. Otila Starn, PharmD, BCPS Clinical Pharmacist [phone] Please check AMION for all [facility] numbers 03/02/2018 4:01 PM

## 2018-03-02 NOTE — Progress Notes (Signed)
  Date: 03/02/2018  Patient name: [name]  Medical record number: [id number]  Date of birth: 07/27/1961   I have seen and evaluated this patient and I have discussed the plan of care with the house staff. Please see their note for complete details. I concur with their findings with the following additions/corrections: [name] was seen on AM rounds with team. We were able to join the GOC discussion with palliative care and Dr [name].  [name] stated he does not like being in the hospital, he does not like getting dialysis, he does not like getting repeat paracentesis is required.  He and his wife came to the decision to transition to comfort care.  We are getting [name] tunneled catheter for ascitic fluid drainage placed hopefully today so that he may been go home after the procedure.  He is going to contact his sister who is in [place] as he he wants to see her before he declines.  He also has [name] daughter in [place].  We will only continue medications but will provide him symptomatic relief.  We will discharge him to home with his wife after the drain is placed.  [name], [name] Storey A, MD 03/02/2018, 2:46 PM

## 2018-03-02 NOTE — Progress Notes (Signed)
[facility] Progress Note  Subjective: had palliative care meeting today and patient has decided to go home w/ hospice, no further dialysis.  We are consulting IR for a pleurex abdominal catheter for removing fluid from abdomen when on hospice.    Vitals:   03/01/18 1816 03/01/18 2206 03/02/18 0536 03/02/18 0800  BP: (!) _0 90/74  Pulse: 81 82 96 94  Resp: _1 Temp: 97.6 F (36.4 C) 97.9 F (36.6 C) 98.2 F (36.8 C) 98.2 F (36.8 C)  TempSrc: Oral Oral Oral Oral  SpO2: 97% 94% 96% 95%  Weight:      Height:        Inpatient medications: . Chlorhexidine Gluconate Cloth  6 each Topical Q0600  . fentaNYL  25 mcg Transdermal Q72H  . ferric citrate  420 mg Oral TID WC  . lactulose  10 g Oral BID  . midodrine  20 mg Oral TID WC  . pantoprazole  40 mg Oral Daily  . rifaximin  550 mg Oral BID   . sodium chloride    . sodium chloride    . cefTRIAXone (ROCEPHIN)  IV 1 g (03/01/18 2246)   sodium chloride, sodium chloride, alteplase, lidocaine (PF), lidocaine-prilocaine, midodrine, oxyCODONE, pentafluoroprop-tetrafluoroeth  Exam: Gen lethargic, no distress, chron ill appearing No jvd or bruits Chest clear bilat RRR no MRG Abd tense ascites 3+, dec'd BS GU normal male MS no joint effusions or deformity Ext 2+ edema bilat LE's Neuro is awake and alert    Home meds:  - lantus 26u / novolog flex pen tid  - rifaximin 500 bid/ lacutulose 15 cc bid  - midodrine 20 tid  - pPI/ auryxia 2 ac tid  - oxy IR 52m q 4h prn/ fentanyl patch 25 ug every 72hrs  - alb nebs prn   Dialysis: MWF  4h  69kg  3K/ 2.25 bath  LUA AVG  no meds    Impression: 1  ESRD - on HD since April 2019.  Not doing well, plan is for transition to hospice care, no further dialysis.  Met w/ pall care team, primary team and family this am.  2  SOB - due to severe ascites and vol overload/ pulm edema. SP HD x 1 and paracentesis x 1.   3  Cirrhosis - w severe ascites  sp para x 1 4  Hypotension - on max midodrine 20 tid 5  Chronic pain syndrome - per primary team   Plan - as above, no further dialysis.  Change to regular diet. Will sign off.    RKelly SplinterMD [facility] pager [phone]  03/02/2018, 1:06 PM   Recent Labs  Lab 03/01/18 0356 03/01/18 0702 03/01/18 1358 03/02/18 0423  NA 136 137  --  137  K 4.7 3.9  --  3.5  CL 99 99  --  98  CO2 23 26  --  27  GLUCOSE 178* 48*  --  169*  BUN 59* 60*  --  33*  CREATININE 6.72* 6.68*  --  4.92*  CALCIUM 8.5* 8.7*  --  8.4*  PHOS  --  4.3  --   --   ALBUMIN 2.2* 2.1*  --   --   INR  --   --  1.09  --    Recent Labs  Lab 03/01/18 0356  AST 35  ALT 18  ALKPHOS 301*  BILITOT 1.5*  PROT 6.1*   Recent Labs  Lab 03/01/18 0356 03/01/18 0702 03/02/18 0423  WBC 14.5* 11.7* 7.3  NEUTROABS 10.7*  --   --   HGB 13.3 12.3* 11.6*  HCT 41.9 38.2* 36.7*  MCV 92.7 90.3 92.4  PLT 110* 102* 69*   Iron/TIBC/Ferritin/ %Sat    Component Value Date/Time   IRON 45 12/06/2017 0625   TIBC 98 (L) 12/06/2017 0625   FERRITIN 230 12/06/2017 0625   IRONPCTSAT 46 (H) 12/06/2017 6389

## 2018-03-02 NOTE — Care Management Note (Signed)
Case Management Note  Patient Details  Name: [name] MRN: [id number] Date of Birth: 09/08/1960  Subjective/Objective:                    Action/Plan:  Discussed discharge planning with patient. Patient to have pleurx cath placed today. Spoke with bedside nurse [name] . MD will need to sign paperwork and CM complete paperwork and fax . [name] Farelbert will order box of 10 pleurx catheters and send home with patient and wife.   Patient and wife do not want ambulance transport home.  [name] and faxed [phone]   [name] will order hospital bed  . Patient does not want to wait for hospital bed to be delivered.  Expected Discharge Date:                  Expected Discharge Plan:  Home w Hospice Care  In-House Referral:     Discharge planning Services  CM Consult  Post Acute Care Choice:    Choice offered to:  Patient  DME Arranged:  Hospital bed DME Agency:     HH Arranged:    HH Agency:     Status of Service:  In process, will continue to follow  If discussed at Long Length of Stay Meetings, dates discussed:    Additional Comments:  [name], [name] Fishbaugh Marie, RN 03/02/2018, 1:47 PM

## 2018-03-02 NOTE — Sedation Documentation (Signed)
o2 2L applied  

## 2018-03-02 NOTE — Procedures (Signed)
Interventional Radiology Procedure Note  Procedure: tunneled peritoneal drainage catheter for recurrent ascites and palliative large volume paracentesis.  .  Complications: None  Recommendations:  - Ok to use - Do not submerge for 7 days - Routine care   Signed,  Yvone NeuJaime S. Loreta AveWagner, DO

## 2018-03-02 NOTE — Sedation Documentation (Signed)
Patient is resting comfortably. 

## 2018-03-02 NOTE — Discharge Summary (Signed)
Name: [name] MRN: [id number] DOB: 1960/09/14 57 y.o. PCP: Dettinger, Elige Radon, MD  Date of Admission: 03/01/2018  3:17 AM Date of Discharge: 03/02/2018  Attending Physician: Burns Spain, MD  Discharge Diagnosis: 1. Decompensated cirrhosis with recurrent ascites  2. Hepatic encephalopathy, resolved  Discharge Medications: Allergies as of 03/02/2018      Reactions   Ketorolac Tromethamine Hives   Temsirolimus Other (See Comments)   Unknown - Pt does not recognize this medication TORISEL-[facility]      Medication List    STOP taking these medications   Abdominal Pad 8"X10" Pads   calcium carbonate (dosed in mg elemental calcium) 1250 MG/5ML Susp   ferric citrate 1 GM 210 MG(Fe) tablet Commonly known as:  AURYXIA   insulin aspart 100 UNIT/ML FlexPen Commonly known as:  NOVOLOG FLEXPEN   Insulin Glargine 100 UNIT/ML Solostar Pen Commonly known as:  LANTUS SOLOSTAR   KERLIX BANDAGE ROLL Misc   lactulose 10 GM/15ML solution Commonly known as:  CHRONULAC   pantoprazole 40 MG tablet Commonly known as:  PROTONIX   VASELINE PETROLATUM GAUZE Pads     TAKE these medications   albuterol 108 (90 Base) MCG/ACT inhaler Commonly known as:  PROVENTIL HFA;VENTOLIN HFA Inhale 2 puffs into the lungs every 6 (six) hours as needed for wheezing or shortness of breath.   fentaNYL 25 MCG/HR patch Commonly known as:  DURAGESIC - dosed mcg/hr Place 25 mcg onto the skin every 3 (three) days.   glycopyrrolate 1 MG tablet Commonly known as:  ROBINUL Take 1 tablet (1 mg total) by mouth every 6 (six) hours as needed. For secretions   LORazepam 0.5 MG tablet Commonly known as:  ATIVAN Take 1 tablet (0.5 mg total) by mouth every 6 (six) hours as needed for anxiety.   midodrine 10 MG tablet Commonly known as:  PROAMATINE Take 1 tablet (10 mg total) by mouth 3 (three) times daily with meals. What changed:  how much to take   ondansetron 4 MG tablet Commonly known as:   ZOFRAN Take 1 tablet (4 mg total) by mouth every 8 (eight) hours as needed for nausea or vomiting.   oxycodone 5 MG capsule Commonly known as:  OXY-IR Take 1-2 capsules (5-10 mg total) by mouth every 4 (four) hours. What changed:  how much to take   rifaximin 550 MG Tabs tablet Commonly known as:  XIFAXAN Take 1 tablet (550 mg total) by mouth 2 (two) times daily.       Disposition and follow-up:   TyroneJennings P [name] was discharged from [facility] in [place] condition.  At the hospital follow up visit please address:  1.  Decompensated cirrhosis with recurrent ascites and Hepatic encephalopathy (resolved).  Patient has opted for hospice  2.  Labs / imaging needed at time of follow-up: None   3.  Pending labs/ test needing follow-up: None  Follow-up Appointments: None  Hospital Course by problem list: 1. Decompensated cirrhosis with recurrent ascites      Hepatic encephalopathy   [name] is a 57 year old male with history of decompensated cirrhosis with recurrent ascites, ESRD currently on HD (MWF), HTN and Diabetes Mellitus who presented to the ED on 03/01/2018 with worseningdyspneaand significant abdominaldiscomfort secondary to ascites.He presented to the emergency department on 03/01/2018 with his wife after he started experiencing significantly shortness of breath and diffuse abdominal distention secondary to ascites.  On initial examination patient was somnolent, lethargic, waxing and waning suggestive  for hepatic encephalopathy and received lactulose and rifaximin. Chest x-ray revealed moderately diffuse pulmonary interstitial edema, low lung volumes with superimposed bibasilar atelectasis confirming that patient had significant pulmonary congestion.  An emergency hemodialysis was arranged with the goal of 2 L output however patient has a history of chronic hypertension which interrupts hemodialysis.  He also underwent therapeutic and diagnostic paracentesis  with an output of 6 L.  Initial labs reviewed mild leukocytosis of 11.7.  Due to concern for SBP he was given empiric Rocephin 2 g. Ascitic fluid analysis later was negative for SBP. On evaluation the next hospital day patient felt significantly better with improved symptoms.  He has had frequent hospital admissions for similar symptoms of abdominal distention and shortness of breath secondary to cirrhosis.  His last admission was on February 22, 2018 and had paracentesis with 4 L output.  He was asked to follow-up with the internal medicine center clinic on February 26, 2018 but however failed to show up.  Patient was [facility] liver and kidney [facility] but however he was deemed to fail to undergo surgery.  There was a family meeting with the patient, his wife, the internal medicine team and palliative care.  We had a discussion about the patient's prognosis and treatment options.  He stated that he was uncomfortable being at the hospital and did not want any further hemodialysis or paracentesis.  [name] and his wife then made a decision to transition to hospice care/comfort care at home.  Appropriate arrangement was made with the assistance of case management.  Interventional radiology was consulted to place a Tunneled peritoneal drainage catheter for recurrent ascites and palliative large volume paracentesis before patient was discharged.    No further treatment for end-stage renal disease as he has made a decision not to undergo hemodialysis  He has chronic hypertension on midodrine 20 mg p.o. 3 times daily  He also has type 2 diabetes which is well controlled however found to be hypoglycemic on admission most likely secondary to malnutrition and decreased p.o. intake.  During this admission his insulin was held.  He takes Lantus 26 units and NovoLog 7-15 units 4 times a day at home  Discharge Vitals:   BP 92/61   Pulse 85   Temp 98.2 F (36.8 C) (Oral)   Resp 16   Ht 5'  8" (1.727 m)   Wt 156 lb (70.8 kg)   SpO2 96%   BMI 23.72 kg/m   Pertinent Labs, Studies, and Procedures:  03/02/2018 - IR -Tunneled peritoneal drainage catheter for recurrent ascites and palliative large volume paracentesis.   03/01/2018 Paracentesis with 6L output  Discharge Instructions: Discharge Instructions    Diet - low sodium heart healthy   Complete by:  As directed    Discharge instructions   Complete by:  As directed    I have provided prescriptions for: -oxycodone to take 1-2 tabs every 4-6 hours as needed for pain -ativan 0.5mg  every 6 hours as needed for anxiety -zofran 4mg  every 8 hours as needed for nausea -robinul 1mg  every 6 hours as needed for oral secretions  Someone from hospice will be by tomorrow to continue care; if you don't hear from them by noon tomorrow, give them your palliative care team a call.   Increase activity slowly   Complete by:  As directed       Signed: Yvette RackAgyei, Maxx Pham K, MD 03/02/2018, 5:49 PM   Pager: [phone]

## 2018-03-02 NOTE — Consult Note (Signed)
Consultation Note Date: 03/02/2018   Patient Name: [name]  DOB: Jul 30, 1961  MRN: [id number]  Age / Sex: 57 y.o., male  PCP: Dettinger, [name] Kaufmann, MD Referring Physician: Bartholomew Crews, MD  Reason for Consultation: Establishing goals of care, Pain control, Psychosocial/spiritual support and Withdrawal of life-sustaining treatment  HPI/Patient Profile: 57 y.o. male admitted on 03/01/2018 with complaints of shortness of breath and abdominal distention. He has a past medical history significant for ESRD (HD M/W/F), alcoholic cirrhosis with ascites, diabetes, GERD, osteoporosis, PTSD, and chronic lower back pain. He was recently admitted a week ago for similar presentation and required a paracentesis (4L) prior to that he was admitted in June and had a paracentesis which yielded 3L. He complains of shortness of breath exacerbation with lying down. He reported minimal relief with use of albuterol inhalers. He denied chest pain, fevers, confusion, but endorses a dry cough. He requires supplemental oxygen due to low saturations in the 80s. Chest x-ray showed bibasilar infiltrates most consistent with pulmonary edema. Patient does have leukocytosis. Since admission he has been seen by Nephrology and received HD. Palliative Medicine team consulted for goals of care discussion.   Clinical Assessment and Goals of Care: I have reviewed medical records including lab results, imaging, Epic notes, and MAR, received report from the bedside RN, and assessed the patient. I then met at the bedside with patient's wife, [name], Dr. Jonnie [name], and the Internal Medicine team to discuss diagnosis prognosis, [name], EOL wishes, disposition and options. Patient is more awake and alert on today. He is able to appropriately identify name, dob, location, and reasoning for hospitalization. He was involved in goals of care discussion.   I  introduced Palliative Medicine as specialized medical care for people living with serious illness. It focuses on providing relief from the symptoms and stress of a serious illness. The goal is to improve quality of life for both the patient and the family. Patient and wife is familiar with Palliative services, as they have [facility] services in the home currently.   We discussed a brief life review of the patient.  Patient has been married to his wife for 27 years.  They do not have any children together however they both have a child from previous marriage.  Patient states he is from [name].  He has a really close relationship with his sister who lives in [name].  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  As far as functional and nutritional status wife states patient health has continued to decline over the past several months.  She states they have been seen at [facility] in [facility] and both have determined he is not a candidate for liver or kidney transplant.  Prior to admission he was at home and was ambulatory with assistance and required some assistance with ADLs.  He reports increase in fatigue over the past  month as well as shortness of breath.  His appetite has been poor and his wife feels that he has lost some weight, however his abdomen continues to increase requiring frequent paracentesis.  Patient does his dialysis experience and states it pretty much wiped him out to the point that he has to sleep most of the day allowing dialysis.  We discussed his current illness and what it means in the larger context of his on-going co-morbidities.  Natural disease trajectory and expectations at EOL were discussed.  Both patient and wife verbalized their understanding of his current illnesses and that eventually it would have come to end-of-life care.  Wife states  provider has told them that he had 6 months or less and they are currently at month #4.  Dr. Jonnie [name] at bedside during conversation and discussed in detail the severity of continuing dialysis.  He explained to them that his body continuing to not tolerate dialysis, which is why he has had current issues with hypotension.  Patient was advised that he could continue dialysis and also encouraged to consider a DNR/DNI code status in the event he was to pass away during dialysis treatments, which would not be unlikely in his current situation.  Both patient and wife verbalized understanding of the information given to them.  They agreed to make the decision together and focus on quality of life.  Patient stated he did not want to continue with dialysis, because he did not like the way it made him feel and he knew his body was giving up.  Wife verbalized her support of his decision.  If continued to make humorous comments throughout the conversation.  I honestly feel like this is her way of coping with the entire situation.  I attempted to elicit values and goals of care important to the patient.    The difference between aggressive medical intervention and comfort care was considered in light of the patient's goals of care.  At this time patient has chosen to be discharged home with hospice care and to allow time to spend with his family, most importantly his sister [name].  He states he is going to call her today and discuss his condition with her.  He verbalized wanting to be able to live several more weeks so that he could visit with her, advised that he should have a conversation with her on today and that she should visit as soon as she can because we cannot promise how his body would react being he is discontinuing HD.  Advised that he may begin to sleep more and if it was important for him to see her face-to-face and have a conversation with her that should probably happen as soon as she could get here.   Patient verbalized understanding.  He also has a daughter who he states travels because her husband is in the [employer].  He states the relationship is not the best but he plans to contact her and let her know what is going on with him.  He is concerned about recurring ascites.  All providers at bedside agree that placement of a temporary drainage cath would be appropriate for symptom management for his ascites.  Patient verbalized understanding and wishes to have catheter placed prior to being discharged home.  Advanced directives, concepts specific to code status, artifical feeding and hydration, and rehospitalization were considered and discussed.  Patient has agreed to be a DNR/DNI.  He verbalizes that he would not want to have any form  of life-sustaining measures and would like to die of a natural and peaceful death if possible.  Hospice services outpatient were explained and offered.  Patient is a current palliative patient of [facility].  At this time both he and his wife have agreed to transition to hospice services in the home.  We discussed in detail the goals of hospice and how they would work with family regarding symptom management and to assist him and his family during the end of life process.  Family is aware that hospice will treat and manage him in his home, as this is what he is requesting.  He knows that the goal is not to continue to have readmissions in the hospital.  We also discussed residential hospice, however the patient stated he does not want to go to a hospice home he wants to pass away in the comfort and privacy of his own home with his family and friends.  Support was given.  Questions and concerns were addressed. The family was encouraged to call with questions or concerns.  PMT will continue to support holistically.  Primary Decision Maker: HCPOA-Wife [name]    SUMMARY OF RECOMMENDATIONS    DNR/DNI at patient/wife's request.  Continue to treat the treatable  while hospitalized with hopes of being discharged later today or tomorrow.  Patient really wants to go home today but is aware that that would be dependent on placement of drainage cath.  Per medical team IR consult placed for drainage catheter related to recurrent ascites.  Case management consult for outpatient hospice services/equipment in the home.  Patient currently has outpatient palliative services with [facility] [place].  Also will need EMS transport at discharge.  Patient has decided to discontinue HD, both patient and his wife verbalized understanding and awareness that his life expectancy would most likely be 2 weeks or less.  Patient to continue on scheduled fentanyl patch 25 mcg.  We will increase Oxy IR to 5-10 mg every 4 hours as needed for moderate to severe pain.  Patient will need Rx for Zofran 4 mg as needed for nausea vomiting at discharge.  Patient will need Rx for Robinul 1 mg as needed every 6 hours for excessive secretions.  Patient will need Rx for Ativan 0.5 mg as needed every 6 hours for anxiety/agitation.  Palliative medicine team will continue to support patient, patient's family, and medical team during hospitalization.  Code Status/Advance Care Planning:  DNR/DNI   Symptom Management:   Patient to continue on scheduled fentanyl patch 25 mcg.  We will increase Oxy IR to 5-10 mg every 4 hours as needed for moderate to severe pain.  Patient will need Rx for Zofran 4 mg as needed for nausea vomiting at discharge.  Patient will need Rx for Robinul 1 mg as needed every 6 hours for excessive secretions.  Patient will need Rx for Ativan 0.5 mg as needed every 6 hours for anxiety/agitation.  Palliative medicine team will continue to support patient, patient's family, and medical team during hospitalization.  Patient will have drainage cath placed prior to discharge for home management of ascites.  Palliative Prophylaxis:   Aspiration, Bowel  Regimen, Delirium Protocol, Frequent Pain Assessment, Oral Care, Palliative Wound Care and Turn Reposition  Additional Recommendations (Limitations, Scope, Preferences):  Full Scope Treatment-failure to treat the treatable while hospitalized without escalation of care.  Psycho-social/Spiritual:   Desire for further Chaplaincy support: NO   Prognosis:   < 2 weeks-comfort measures only, discontinuation of HD,cirrhosis with severe  reoccurring ascites, hypotension on max midodrine, ESRD, chronic pain syndrome, volume overload, shortness of breath, pulmonary edema, poor po intake, decreased mobility, lethargy, and malnutrition/failure to thrive (wt 4/19 168 lb wt 7/8 153.6lb).   Discharge Planning: Home with Hospice      Primary Diagnoses: Present on Admission: . Decompensated hepatic cirrhosis ([place])   I have reviewed the medical record, interviewed the patient and family, and examined the patient. The following aspects are pertinent.  Past Medical History:  Diagnosis Date  . Chronic lower back pain   . Compression fracture    lumbar 3  . Diabetes mellitus   . Fracture acetabulum-closed ([place]) 04/09/2013  . GERD (gastroesophageal reflux disease)   . H/O Legionnaire's disease   . Hepatomegaly   . Migraine   . Osteoporosis   . Pancreatitis   . PTSD (post-traumatic stress disorder)   . Vitamin D deficiency    Social History   Socioeconomic History  . Marital status: Married    Spouse name: Not on file  . Number of children: Not on file  . Years of education: Not on file  . Highest education level: Not on file  Occupational History  . Not on file  Social Needs  . Financial resource strain: Not on file  . Food insecurity:    Worry: Not on file    Inability: Not on file  . Transportation needs:    Medical: Not on file    Non-medical: Not on file  Tobacco Use  . Smoking status: Former Smoker    Packs/day: 0.50    Years: 26.00    Pack years: 13.00    Types:  Cigarettes    Last attempt to quit: 08/26/2007    Years since quitting: 10.5  . Smokeless tobacco: Never Used  Substance and Sexual Activity  . Alcohol use: Yes    Alcohol/week: 3.6 oz    Types: 6 Glasses of wine per week    Comment: 6 oz x 6 glass of wine daily   . Drug use: No  . Sexual activity: Not Currently    Birth control/protection: None  Lifestyle  . Physical activity:    Days per week: Not on file    Minutes per session: Not on file  . Stress: Not on file  Relationships  . Social connections:    Talks on phone: Not on file    Gets together: Not on file    Attends religious service: Not on file    Active member of club or organization: Not on file    Attends meetings of clubs or organizations: Not on file    Relationship status: Not on file  Other Topics Concern  . Not on file  Social History Narrative  . Not on file   Family History  Problem Relation Age of Onset  . Cancer Unknown        breast/grandmother, prostate/grandfather   Scheduled Meds: . Chlorhexidine Gluconate Cloth  6 each Topical Q0600  . fentaNYL  25 mcg Transdermal Q72H  . ferric citrate  420 mg Oral TID WC  . lactulose  10 g Oral BID  . midodrine  20 mg Oral TID WC  . pantoprazole  40 mg Oral Daily  . rifaximin  550 mg Oral BID   Continuous Infusions: . sodium chloride    . sodium chloride    . cefTRIAXone (ROCEPHIN)  IV 1 g (03/01/18 2246)   PRN Meds:.sodium chloride, sodium chloride, alteplase, lidocaine (PF), lidocaine-prilocaine, midodrine, oxyCODONE, pentafluoroprop-tetrafluoroeth  Medications Prior to Admission:  Prior to Admission medications   Medication Sig Start Date End Date Taking? Authorizing Provider  albuterol (PROVENTIL HFA;VENTOLIN HFA) 108 (90 Base) MCG/ACT inhaler Inhale 2 puffs into the lungs every 6 (six) hours as needed for wheezing or shortness of breath. 10/16/17  Yes Terald Sleeper, PA-C  Calcium Carbonate Antacid (CALCIUM CARBONATE, DOSED IN MG ELEMENTAL CALCIUM,)  1250 MG/5ML SUSP Take 5 mLs (500 mg of elemental calcium total) by mouth every 6 (six) hours as needed for indigestion. 12/13/17  Yes Elgergawy, Silver Huguenin, MD  fentaNYL (DURAGESIC - DOSED MCG/HR) 25 MCG/HR patch Place 25 mcg onto the skin every 3 (three) days.   Yes [provider]  ferric citrate (AURYXIA) 1 GM 210 MG(Fe) tablet Take 2 tablets (420 mg total) by mouth 3 (three) times daily with meals. 02/22/18  Yes Alphonzo Grieve, MD  Gauze Pads & Dressings (ABDOMINAL PAD) 8"X10" PADS 1 each by Does not apply route as needed. Use as needed 02/09/18  Yes Dettinger, [name] Kaufmann, MD  Gauze Pads & Dressings [facility] BANDAGE ROLL) MISC Use as needed 02/09/18  Yes Dettinger, [name] Kaufmann, MD  insulin aspart (NOVOLOG FLEXPEN) 100 UNIT/ML FlexPen INJECT 7 TO 15 UNITS subcu up to 4 times daily Patient taking differently: Inject 5-7 Units into the skin 3 (three) times daily with meals.  10/23/17  Yes Dettinger, [name]auf[place], MD  Insulin Glargine (LANTUS SOLOSTAR) 100 UNIT/ML Solostar Pen Inject 26 Units into the skin daily. Patient taking differently: Inject 34 Units into the skin daily.  12/13/17  Yes Elgergawy, Silver Huguenin, MD  lactulose (CHRONULAC) 10 GM/15ML solution Take 15 mLs (10 g total) by mouth 2 (two) times daily. 12/13/17  Yes Elgergawy, Silver Huguenin, MD  midodrine (PROAMATINE) 10 MG tablet Take 1 tablet (10 mg total) by mouth 3 (three) times daily with meals. Patient taking differently: Take 20 mg by mouth 3 (three) times daily with meals.  12/13/17  Yes Elgergawy, Silver Huguenin, MD  oxycodone (OXY-IR) 5 MG capsule Take 5 mg by mouth every 4 (four) hours.    Yes [provider]  pantoprazole (PROTONIX) 40 MG tablet Take 1 tablet (40 mg total) by mouth daily. 10/22/17  Yes Dettinger, [name] Kaufmann, MD  rifaximin (XIFAXAN) 550 MG TABS tablet Take 1 tablet (550 mg total) by mouth 2 (two) times daily. 12/13/17  Yes Elgergawy, Silver Huguenin, MD  Wound Dressings (VASELINE PETROLATUM GAUZE) PADS Use as needed 02/09/18  Yes Dettinger,  [name] Kauf[place], MD   Allergies  Allergen Reactions  . Ketorolac Tromethamine Hives  . Temsirolimus Other (See Comments)    Unknown - Pt does not recognize this medication [facility]   Review of Systems  Constitutional: Positive for activity change, appetite change, fatigue and unexpected weight change.  Respiratory: Positive for shortness of breath.   Cardiovascular: Positive for leg swelling.  Gastrointestinal: Positive for abdominal distention and abdominal pain.  Genitourinary: Positive for penile swelling and scrotal swelling.  Musculoskeletal: Positive for back pain.  Neurological: Positive for weakness.  All other systems reviewed and are negative.   Physical Exam  Constitutional: He appears cachectic. He is cooperative. He appears ill.  Fragile and critical in appearance   Cardiovascular: Regular rhythm and normal heart sounds. Exam reveals decreased pulses.  Hypotensive   Pulmonary/Chest: He has decreased breath sounds.  Shortness of breath at times   Abdominal: He exhibits no ascites. Bowel sounds are decreased.  S/p paracentesis 7/8 (6L)  Genitourinary: Right testis shows swelling and  tenderness. Left testis shows swelling and tenderness. Penile tenderness present.  Neurological: He is alert.  Skin: Skin is warm and dry.  Edema to bilateral lower extrem. Dressing clean, dry, intact   Psychiatric: He has a normal mood and affect. His speech is normal and behavior is normal. Judgment and thought content normal. Cognition and memory are normal.  Nursing note and vitals reviewed.   Vital Signs: BP 90/74 (BP Location: Right Arm)   Pulse 94   Temp 98.2 F (36.8 C) (Oral)   Resp 16   Ht '5\' 8"'$  (9.323 m)   Wt 70.8 kg (156 lb)   SpO2 95%   BMI 23.72 kg/m  Pain Scale: 0-10   Pain Score: 0-No pain   SpO2: SpO2: 95 % O2 Device:SpO2: 95 % O2 Flow Rate: .O2 Flow Rate (L/min): 2 L/min  IO: Intake/output summary:   Intake/Output Summary (Last 24 hours) at 03/02/2018  1341 Last data filed at 03/02/2018 1000 Gross per 24 hour  Intake 802 ml  Output -  Net 802 ml    LBM: Last BM Date: 03/01/18 Baseline Weight: Weight: 70.8 kg (156 lb) Most recent weight: Weight: (pt on stretcher)     Palliative Assessment/Data: PPS 20%   Flowsheet Rows     Most Recent Value  Intake Tab  Referral Department  Hospitalist  Unit at Time of Referral  Other (Comment) [HD]  Palliative Care Primary Diagnosis  Nephrology  Date Notified  03/01/18  Palliative Care Type  Return patient Palliative Care  Reason for referral  Clarify Goals of Care  Date of Admission  03/01/18  Date first seen by Palliative Care  03/01/18  # of days Palliative referral response time  0 Day(s)  # of days IP prior to Palliative referral  0  Clinical Assessment  Psychosocial & Spiritual Assessment  Palliative Care Outcomes     Time In: 0945 Time Out: 1100 Time Total: 75 min.   Greater than 50%  of this time was spent counseling and coordinating care related to the above assessment and plan.  Signed by:  Alda Lea, NP-BC Palliative Medicine Team  Phone: [phone] Fax: [phone] Pager: [phone] Amion: Bjorn Pippin    Please contact Palliative Medicine Team phone at [phone] for questions and concerns.  For individual provider: See Shea Evans

## 2018-03-02 NOTE — H&P (Signed)
Chief Complaint: Recurrent ascites  Referring Physician(s): BUTCHER, ELIZABETH A  Supervising Physician: Gilmer Mor  Patient Status: [facility] - In-pt  History of Present Illness: [name] is a 57 y.o. male with decompensated cirrhosis due to alcohol use.  He was brought to ED by wife yesterday secondary to abd distension.   This is his 4th admission for ascites since he was diagnosed with cirrhosis in April 2019.   Most recent D/C was 7/1 following a paracentesis of 4 L.   He underwent a paracentesis at the bedside yesterday by Dr. Oswaldo Done which yielded 6 liters.  He was seen by the transplant team on 7/2 but they felt he was too frail for transplant.  Palliative care consult has been obtained and the patient would like hospice placement.  We are asked to place a tunneled peritoneal catheter (PleurX) to better manage the ascites at hospice home.  He is NPO since 6 am. No blood thinners.  Past Medical History:  Diagnosis Date  . Chronic lower back pain   . Compression fracture    lumbar 3  . Diabetes mellitus   . Fracture acetabulum-closed (HCC) 04/09/2013  . GERD (gastroesophageal reflux disease)   . H/O Legionnaire's disease   . Hepatomegaly   . Migraine   . Osteoporosis   . Pancreatitis   . PTSD (post-traumatic stress disorder)   . Vitamin D deficiency     Past Surgical History:  Procedure Laterality Date  . AV FISTULA PLACEMENT Left 12/10/2017   Procedure: INSERTION OF ARTERIOVENOUS (AV) GORE-TEX GRAFT LEFT  ARM;  Surgeon: Chuck Hint, MD;  Location: [facility] OR;  Service: Vascular;  Laterality: Left;  . BACK SURGERY    . CHOLECYSTECTOMY    . HERNIA REPAIR    . IR FLUORO GUIDE CV LINE RIGHT  11/27/2017  . IR US GUIDE VASC ACCESS RIGHT  11/27/2017  . KYPHOPLASTY N/A 07/16/2015   Procedure: Lumbar three kyphoplasty;  Surgeon: Coletta Memos, MD;  Location: [facility];  Service: Neurosurgery;  Laterality: N/A;  Lumbar three kyphoplasty  .  LAMINECTOMY    . MASS EXCISION Left 06/12/2017   Procedure: EXCISION LEFT AXILLARY SEBACEOUS CYST;  Surgeon: Abigail Miyamoto, MD;  Location: [facility];  Service: General;  Laterality: Left;  . STERNUM FRACTURE SURGERY      Allergies: Ketorolac tromethamine and Temsirolimus  Medications: Prior to Admission medications   Medication Sig Start Date End Date Taking? Authorizing Provider  albuterol (PROVENTIL HFA;VENTOLIN HFA) 108 (90 Base) MCG/ACT inhaler Inhale 2 puffs into the lungs every 6 (six) hours as needed for wheezing or shortness of breath. 10/16/17  Yes Remus Loffler, PA-C  Calcium Carbonate Antacid (CALCIUM CARBONATE, DOSED IN MG ELEMENTAL CALCIUM,) 1250 MG/5ML SUSP Take 5 mLs (500 mg of elemental calcium total) by mouth every 6 (six) hours as needed for indigestion. 12/13/17  Yes Elgergawy, Leana Roe, MD  fentaNYL (DURAGESIC - DOSED MCG/HR) 25 MCG/HR patch Place 25 mcg onto the skin every 3 (three) days.   Yes [provider]  ferric citrate (AURYXIA) 1 GM 210 MG(Fe) tablet Take 2 tablets (420 mg total) by mouth 3 (three) times daily with meals. 02/22/18  Yes Nyra Market, MD  Gauze Pads & Dressings (ABDOMINAL PAD) 8"X10" PADS 1 each by Does not apply route as needed. Use as needed 02/09/18  Yes Dettinger, Elige Radon, MD  Gauze Pads & Dressings [facility] BANDAGE ROLL) MISC Use as needed 02/09/18  Yes Dettinger, Elige Radon,  MD  insulin aspart (NOVOLOG FLEXPEN) 100 UNIT/ML FlexPen INJECT 7 TO 15 UNITS subcu up to 4 times daily Patient taking differently: Inject 5-7 Units into the skin 3 (three) times daily with meals.  10/23/17  Yes Dettinger, Elige Radon, MD  Insulin Glargine (LANTUS SOLOSTAR) 100 UNIT/ML Solostar Pen Inject 26 Units into the skin daily. Patient taking differently: Inject 34 Units into the skin daily.  12/13/17  Yes Elgergawy, Leana Roe, MD  lactulose (CHRONULAC) 10 GM/15ML solution Take 15 mLs (10 g total) by mouth 2 (two) times daily. 12/13/17  Yes Elgergawy,  Leana Roe, MD  midodrine (PROAMATINE) 10 MG tablet Take 1 tablet (10 mg total) by mouth 3 (three) times daily with meals. Patient taking differently: Take 20 mg by mouth 3 (three) times daily with meals.  12/13/17  Yes Elgergawy, Leana Roe, MD  oxycodone (OXY-IR) 5 MG capsule Take 5 mg by mouth every 4 (four) hours.    Yes [provider]  pantoprazole (PROTONIX) 40 MG tablet Take 1 tablet (40 mg total) by mouth daily. 10/22/17  Yes Dettinger, Elige Radon, MD  rifaximin (XIFAXAN) 550 MG TABS tablet Take 1 tablet (550 mg total) by mouth 2 (two) times daily. 12/13/17  Yes Elgergawy, Leana Roe, MD  Wound Dressings (VASELINE PETROLATUM GAUZE) PADS Use as needed 02/09/18  Yes Dettinger, Elige Radon, MD     Family History  Problem Relation Age of Onset  . Cancer Unknown        breast/grandmother, prostate/grandfather    Social History   Socioeconomic History  . Marital status: Married    Spouse name: Not on file  . Number of children: Not on file  . Years of education: Not on file  . Highest education level: Not on file  Occupational History  . Not on file  Social Needs  . Financial resource strain: Not on file  . Food insecurity:    Worry: Not on file    Inability: Not on file  . Transportation needs:    Medical: Not on file    Non-medical: Not on file  Tobacco Use  . Smoking status: Former Smoker    Packs/day: 0.50    Years: 26.00    Pack years: 13.00    Types: Cigarettes    Last attempt to quit: 08/26/2007    Years since quitting: 10.5  . Smokeless tobacco: Never Used  Substance and Sexual Activity  . Alcohol use: Yes    Alcohol/week: 3.6 oz    Types: 6 Glasses of wine per week    Comment: 6 oz x 6 glass of wine daily   . Drug use: No  . Sexual activity: Not Currently    Birth control/protection: None  Lifestyle  . Physical activity:    Days per week: Not on file    Minutes per session: Not on file  . Stress: Not on file  Relationships  . Social connections:    Talks  on phone: Not on file    Gets together: Not on file    Attends religious service: Not on file    Active member of club or organization: Not on file    Attends meetings of clubs or organizations: Not on file    Relationship status: Not on file  Other Topics Concern  . Not on file  Social History Narrative  . Not on file     Review of Systems: A 12 point ROS discussed Review of Systems  Constitutional: Positive for activity change, fatigue  and unexpected weight change.  HENT: Negative.   Respiratory: Positive for shortness of breath.   Gastrointestinal: Positive for abdominal distention and abdominal pain.  Genitourinary: Negative.   Musculoskeletal: Positive for arthralgias.  Skin: Positive for color change.    Vital Signs: BP 90/74 (BP Location: Right Arm)   Pulse 94   Temp 98.2 F (36.8 C) (Oral)   Resp 16   Ht 5\' 8"  (1.727 m)   Wt 156 lb (70.8 kg)   SpO2 95%   BMI 23.72 kg/m   Physical Exam  Constitutional: He is oriented to person, place, and time.  Thin, frail, ill appearing  HENT:  Head: Normocephalic and atraumatic.  Eyes: EOM are normal.  Neck: Normal range of motion.  Cardiovascular: Normal rate, regular rhythm and normal heart sounds.  Pulmonary/Chest: Effort normal.  Diminished breath sounds bilaterally  Abdominal: He exhibits distension.  Neurological: He is alert and oriented to person, place, and time.  Skin: Skin is warm and dry.  Psychiatric: He has a normal mood and affect. His behavior is normal. Judgment and thought content normal.  Vitals reviewed.   Imaging: Dg Chest 2 View  Result Date: 02/21/2018 CLINICAL DATA:  57 y/o  M; shortness of breath and fluid overload. EXAM: CHEST - 2 VIEW COMPARISON:  02/12/2018 chest radiograph FINDINGS: Stable enlarged cardiac silhouette given projection and technique. Aortic atherosclerosis with calcification. Low lung volumes. Streaky opacities in the lung bases. No pneumothorax. Stable multilevel loss of  vertebral body height and kyphoplasty changes in the upper lumbar spine. Plate and screw fixation of the sternum. No acute osseous abnormality identified. IMPRESSION: Stable cardiomegaly. Stable low lung volumes and basilar platelike atelectasis. Pulmonary vascular congestion. Electronically Signed   By: Mitzi HansenLance  Furusawa-Stratton M.D.   On: 02/21/2018 21:53   Dg Chest 2 View  Result Date: 02/12/2018 CLINICAL DATA:  Shortness of breath today since before going to dialysis, fluid retention, diabetes mellitus EXAM: CHEST - 2 VIEW COMPARISON:  02/11/2018 FINDINGS: Enlargement of cardiac silhouette with slight pulmonary vascular congestion. Line mediastinal contours normal. Low lung volumes with bibasilar atelectasis. No definite acute infiltrate or edema. No pleural effusion or pneumothorax. Bones demineralized with multiple thoracolumbar compression fractures identified. IMPRESSION: Low lung volumes with bibasilar atelectasis. Enlargement of cardiac silhouette. Electronically Signed   By: Ulyses SouthwardMark  Boles M.D.   On: 02/12/2018 16:57   Dg Chest Port 1 View  Result Date: 03/01/2018 CLINICAL DATA:  Initial evaluation for acute shortness of breath. EXAM: PORTABLE CHEST 1 VIEW COMPARISON:  Prior radiograph from 02/21/2018 FINDINGS: Stable cardiomegaly. Mild widening of the mediastinal silhouette most likely related to AP technique and shallow lung inflation. Lungs hypoinflated. Diffuse vascular congestion with mild interstitial prominence, suggesting mild pulmonary interstitial edema. Superimposed bibasilar atelectatic changes. No definite focal infiltrates. No pneumothorax. No acute osseus abnormality. Remote bilateral rib fractures noted. Osteopenia. IMPRESSION: 1. Stable cardiomegaly with moderate diffuse pulmonary interstitial edema. 2. Low lung volumes with superimposed bibasilar atelectasis. Electronically Signed   By: Rise MuBenjamin  McClintock M.D.   On: 03/01/2018 04:49   Dg Chest Port 1 View  Result Date:  02/11/2018 CLINICAL DATA:  Shortness of breath EXAM: PORTABLE CHEST 1 VIEW COMPARISON:  November 22, 2017 FINDINGS: There is patchy consolidation in both lung bases with small pleural effusions bilaterally. Lungs elsewhere are clear. Heart is mildly enlarged with pulmonary vascularity normal. No adenopathy. Bones are osteoporotic. Postoperative change in the sternal region noted. IMPRESSION: Patchy bibasilar consolidation consistent with bibasilar pneumonia. Small pleural effusions bilaterally. Stable cardiac  prominence. No evident adenopathy. Electronically Signed   By: Bretta Bang III M.D.   On: 02/11/2018 10:55   [place] Ascites (abdomen Limited)  Result Date: 02/17/2018 CLINICAL DATA:  Alcoholic cirrhosis.  Leaking. EXAM: LIMITED ABDOMEN ULTRASOUND FOR ASCITES TECHNIQUE: Limited ultrasound survey for ascites was performed in all four abdominal quadrants. COMPARISON:  11/17/2017 FINDINGS: No ascites. The patient presented with a bandage over a right lower quadrant dermotomy, site of reported prior paracentesis. Sonography staff treated the site with occlusive dressing such that I did not see the wound. No ascites seen. Patient is currently asymptomatic. SBP was described to the patient with warnings to present promptly if he experiences abdominal pain or fevers. IMPRESSION: Negative for ascites. Occlusive bandage was placed over patient's prior paracentesis wound, which was leaking. The patient is currently asymptomatic, warnings for bacterial peritonitis were provided. Electronically Signed   By: Marnee Spring M.D.   On: 02/17/2018 14:16    Labs:  CBC: Recent Labs    02/22/18 1508 03/01/18 0356 03/01/18 0702 03/02/18 0423  WBC 7.7 14.5* 11.7* 7.3  HGB 12.6* 13.3 12.3* 11.6*  HCT 38.6* 41.9 38.2* 36.7*  PLT 69* 110* 102* 69*    COAGS: Recent Labs    10/16/17 1908 11/07/17 1754  02/11/18 1037 02/12/18 0650 02/21/18 2144 03/01/18 1358  INR 1.58 1.90   < > 1.23 1.29 1.16 1.09  APTT  46* 45*  --   --  35  --   --    < > = values in this interval not displayed.    BMP: Recent Labs    02/22/18 1508 03/01/18 0356 03/01/18 0702 03/02/18 0423  NA 138 136 137 137  K 3.7 4.7 3.9 3.5  CL 103 99 99 98  CO2 24 23 26 27   GLUCOSE 206* 178* 48* 169*  BUN 38* 59* 60* 33*  CALCIUM 8.1* 8.5* 8.7* 8.4*  CREATININE 6.40* 6.72* 6.68* 4.92*  GFRNONAA 9* 8* 8* 12*  GFRAA 10* 10* 10* 14*    LIVER FUNCTION TESTS: Recent Labs    02/11/18 1037 02/12/18 0650 02/12/18 1018 02/21/18 2144 02/22/18 1508 03/01/18 0356 03/01/18 0702  BILITOT 2.1* QUANTITY NOT SUFFICIENT, UNABLE TO PERFORM TEST 1.9* 1.6*  --  1.5*  --   AST 32 27  --  32  --  35  --   ALT 17 15*  --  17  --  18  --   ALKPHOS 238* 223*  --  244*  --  301*  --   PROT 6.5 5.7*  --  5.5*  --  6.1*  --   ALBUMIN 2.3* 2.1*  --  1.9* 1.7* 2.2* 2.1*    TUMOR MARKERS: No results for input(s): AFPTM, CEA, CA199, CHROMGRNA in the last 8760 hours.  Assessment and Plan:  Recurrent ascites secondary to alcoholic cirrhosis.  Will be transferred to hospice care after placement of tunneled peritoneal catheter.  Risks and benefits discussed with the patient including bleeding, infection, damage to adjacent structures, malfunction of the catheter with need for additional procedures.  All of the patient's questions were answered, patient is agreeable to proceed. Consent signed and in chart.  Thank you for this interesting consult.  I greatly enjoyed meeting [name] and look forward to participating in their care.  A copy of this report was sent to the requesting provider on this date.  Electronically Signed: Gwynneth Macleod, PA-C   03/02/2018, 12:30 PM      I spent a  total of 20 Minutes in face to face in clinical consultation, greater than 50% of which was counseling/coordinating care for peritoneal PleurX.

## 2018-03-02 NOTE — Progress Notes (Signed)
Subjective: Total day 1  Overnight: Underwent hemodialysis and paracentesis.  Afebrile  Today, [name] was seen at bedside with his wife and the palliative team.  When asked how he was doing when he stated "I'm tired."  During our discussion he stated that he does not like staying at the hospital, hemodialysis makes him uncomfortable as well as paracentesis.  He made a decision to go home with hospice and comfort care along with his wife.  He made the team aware that he wants to stay alive until his sister visits him from [place].   Objective:  Vital signs in last 24 hours: Vitals:   03/01/18 1816 03/01/18 2206 03/02/18 0536 03/02/18 0800  BP: (!) 82/62 94/64 93/63  90/74  Pulse: 81 82 96 94  Resp: 18 16 16    Temp: 97.6 F (36.4 C) 97.9 F (36.6 C) 98.2 F (36.8 C) 98.2 F (36.8 C)  TempSrc: Oral Oral Oral Oral  SpO2: 97% 94% 96% 95%  Weight:      Height:        Physical exams Constitutional: In no acute distress, improved mental status, able to converse HEENT: Atraumatic, normocephalic Eyes: Nonicteric  Assessment/Plan:  Active Problems:   Decompensated hepatic cirrhosis [facility])  [name] is a 57 year old male with history of decompensated cirrhosis with recurrent ascites, ESRD currently on HD (MWF), HTN and Diabetes Mellitus who presented to the ED on 03/01/2018 with worsening dyspnea and significant abdominal discomfort secondary to ascites.   Decompensated cirrhosis with ascites  Hepatic encephalopathy, resolved He presented to the emergency department on 03/01/2018 with his wife after he started experiencing significantly shortness of breath and diffuse abdominal distention secondary to ascites.  On initial examination patient was somnolent, lethargic, waxing and waning suggestive for hepatic encephalopathy and received lactulose and rifaximin. Chest x-ray revealed moderately diffuse pulmonary interstitial edema, low lung volumes with superimposed bibasilar  atelectasis confirming that patient had significant pulmonary congestion.  An emergency hemodialysis was arranged with the goal of 2 L output however patient has a history of chronic hypertension which interrupts hemodialysis.  He also underwent therapeutic and diagnostic paracentesis with an output of 6 L.  Initial labs reviewed mild leukocytosis of 11.7.  Due to concern for SBP he was given empiric Rocephin 2 g. Ascitic fluid analysis later was negative for SBP. On evaluation the next hospital day patient felt significantly better with improved symptoms.  He has had frequent hospital admissions for similar symptoms of abdominal distention and shortness of breath secondary to cirrhosis.  His last admission was on February 22, 2018 and had paracentesis with 4 L output.  He was asked to follow-up with the internal medicine center clinic on February 26, 2018 but however failed to show up.  Patient was being evaluated by [facility] for liver and kidney transplant but however he was deemed to fail to undergo surgery. -Leukocytosis resolved -s/p IV albumin infusion -On rifaximin 550 mg p.o. twice daily -On lactulose 10 g/83mL -Hospice and comfort care -Tunneled peritoneal catheter for ascitic fluid drainage to be placed by IR today (03/02/2018) -Anticipate discharge after procedure  End-stage renal disease on hemodialysis Monday, Wednesday, Friday -Urgent hemodialysis performed on 03/01/2018 -Initial BMP revealed hyperkalemia of 4.7 --> currently 3.5  Type 2 Diabetes  -Well-controlled -On arrival to the ED was found to be hypoglycemic with capillary blood glucose of 49 most likely secondary to malnutrition and decreased p.o. Intake -On Lantus 26 units and NovoLog 7 to 15 units 4  times daily at home -Insulin held.   Chronic hypertension -On midodrine 20 mg p.o. 3 times daily  [name], [name] Justiniano K, MD 03/02/2018, 3:10 PM Pager: [phone]

## 2018-03-03 ENCOUNTER — Ambulatory Visit (INDEPENDENT_AMBULATORY_CARE_PROVIDER_SITE_OTHER): Payer: Medicare HMO

## 2018-03-03 ENCOUNTER — Telehealth: Payer: Self-pay | Admitting: Family Medicine

## 2018-03-03 DIAGNOSIS — N186 End stage renal disease: Secondary | ICD-10-CM | POA: Diagnosis not present

## 2018-03-03 DIAGNOSIS — K219 Gastro-esophageal reflux disease without esophagitis: Secondary | ICD-10-CM | POA: Diagnosis not present

## 2018-03-03 DIAGNOSIS — F1010 Alcohol abuse, uncomplicated: Secondary | ICD-10-CM

## 2018-03-03 DIAGNOSIS — E1122 Type 2 diabetes mellitus with diabetic chronic kidney disease: Secondary | ICD-10-CM | POA: Diagnosis not present

## 2018-03-03 DIAGNOSIS — I2510 Atherosclerotic heart disease of native coronary artery without angina pectoris: Secondary | ICD-10-CM | POA: Diagnosis not present

## 2018-03-03 DIAGNOSIS — F4310 Post-traumatic stress disorder, unspecified: Secondary | ICD-10-CM

## 2018-03-03 DIAGNOSIS — K767 Hepatorenal syndrome: Secondary | ICD-10-CM | POA: Diagnosis not present

## 2018-03-03 DIAGNOSIS — S81801D Unspecified open wound, right lower leg, subsequent encounter: Secondary | ICD-10-CM | POA: Diagnosis not present

## 2018-03-03 DIAGNOSIS — K7030 Alcoholic cirrhosis of liver without ascites: Secondary | ICD-10-CM | POA: Diagnosis not present

## 2018-03-03 DIAGNOSIS — M810 Age-related osteoporosis without current pathological fracture: Secondary | ICD-10-CM | POA: Diagnosis not present

## 2018-03-03 DIAGNOSIS — E1142 Type 2 diabetes mellitus with diabetic polyneuropathy: Secondary | ICD-10-CM

## 2018-03-03 DIAGNOSIS — M455 Ankylosing spondylitis of thoracolumbar region: Secondary | ICD-10-CM

## 2018-03-03 LAB — PROTEIN, PLEURAL OR PERITONEAL FLUID: Total protein, fluid: <3 g/dL

## 2018-03-03 NOTE — Care Management (Signed)
Pleux catheter [facility] [phone]  Ronny FlurryHeather Alaira Level RN

## 2018-03-03 NOTE — Progress Notes (Signed)
Discharge home with wife on stable condition,no pain no discmfort and no bleeding at the site of the procedure.Patient has six pieces of Pleur-X drain tube that was send with them.

## 2018-03-03 NOTE — Telephone Encounter (Signed)
[name] with [facility] needs to speak with you about orders for this patient.  Please call.  Thank you.

## 2018-03-04 NOTE — Telephone Encounter (Signed)
Spouse is calling about the fluid to be removed from pt for hospice care. Please call back

## 2018-03-04 NOTE — Telephone Encounter (Signed)
Do 1000 mL as needed for shortness of breath or swelling or difficulty breathing

## 2018-03-04 NOTE — Telephone Encounter (Signed)
Hospice needs to have orders for Pleurx  Do you want draining on a schedule or PRN   Normally limit 1000-2000   Please advise   [phone]

## 2018-03-05 ENCOUNTER — Telehealth: Payer: Self-pay | Admitting: *Deleted

## 2018-03-05 LAB — BODY FLUID CULTURE: Culture: NO GROWTH

## 2018-03-05 NOTE — Telephone Encounter (Signed)
[facility] aware of approved orders

## 2018-03-05 NOTE — Telephone Encounter (Signed)
[facility] aware of orders for pleurax drain

## 2018-03-05 NOTE — Telephone Encounter (Signed)
I am fine with changing those to liquid, go ahead and send the orders however we need to to hospice.  Go ahead and order the atropine and DC the p.o. meds

## 2018-03-05 NOTE — Telephone Encounter (Signed)
[facility] Wife called them patient unable to swallow [facility] would like orders to change Oxycodone & Ativan to liquid Order of Atropine drops 2-4 gtts q 2-4 prn secretions D/C po meds Please advise

## 2018-03-06 LAB — CULTURE, BLOOD (ROUTINE X 2)
Culture: NO GROWTH
Culture: NO GROWTH
SPECIAL REQUESTS: ADEQUATE

## 2018-03-14 DIAGNOSIS — N186 End stage renal disease: Secondary | ICD-10-CM | POA: Diagnosis not present

## 2018-03-25 DEATH — deceased

## 2020-01-13 IMAGING — CT CT ABD-PELV W/ CM
2 of 5 series · 14 of 46 positions shown, 16 images · IV contrast (iopamidol)
Comparison: Lumbar MRI 11/28/2016.  Abdominopelvic CT 08/06/2007.

CLINICAL DATA: Mid chest pain with back pain and shortness of
breath. Blunt abdominal trauma. Flu.

EXAM:
CT CHEST, ABDOMEN, AND PELVIS WITH CONTRAST
TECHNIQUE: Multidetector CT imaging of the chest, abdomen and pelvis was
performed following the standard protocol during bolus
administration of intravenous contrast.
CONTRAST:  100mL RFPQEY-Z99 IOPAMIDOL ([id number]) INJECTION 61%

[Series 3: cap with · axial · 0.77mm/px · z∈[+801,+1326]mm · 11 of 127 slices shown, 13 images]
[im 11/127  soft-tissue]
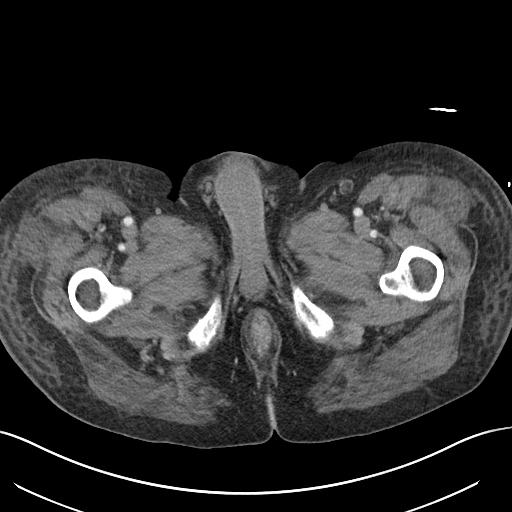
[im 11/127  bone]
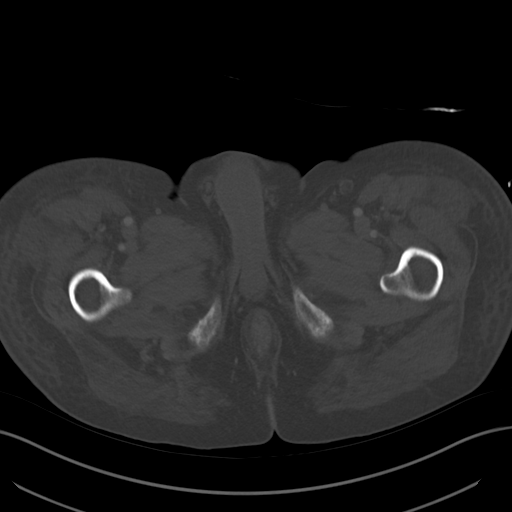
[im 22/127  soft-tissue]
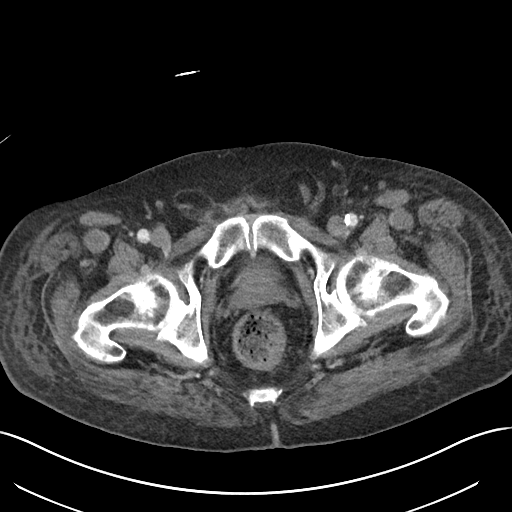
[im 32/127  soft-tissue]
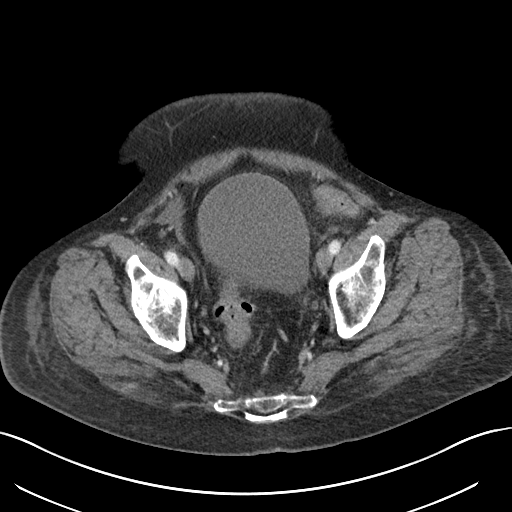
[im 43/127  soft-tissue]
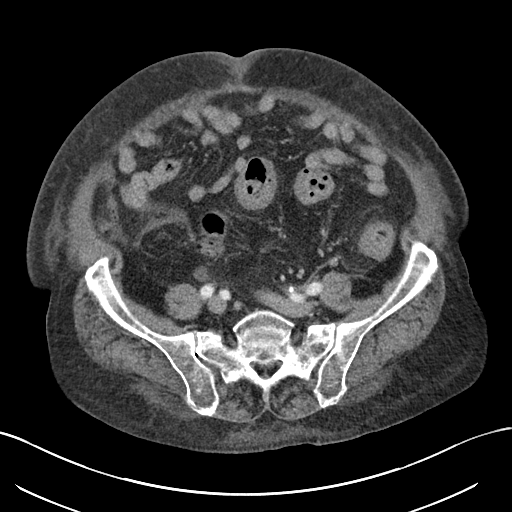
[im 53/127  soft-tissue]
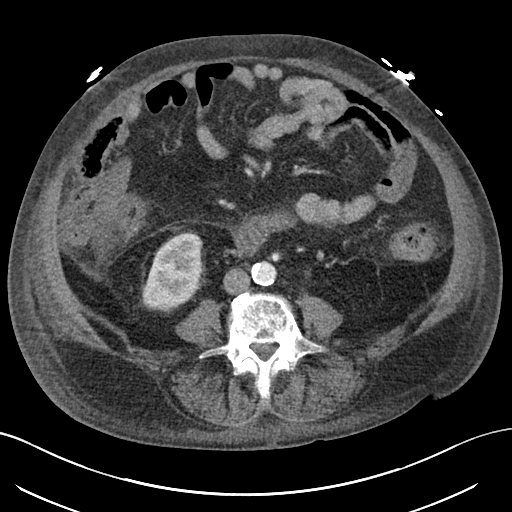
[im 64/127  soft-tissue]
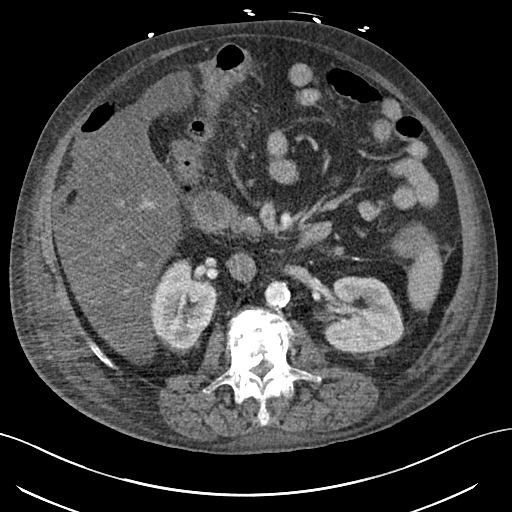
[im 74/127  soft-tissue]
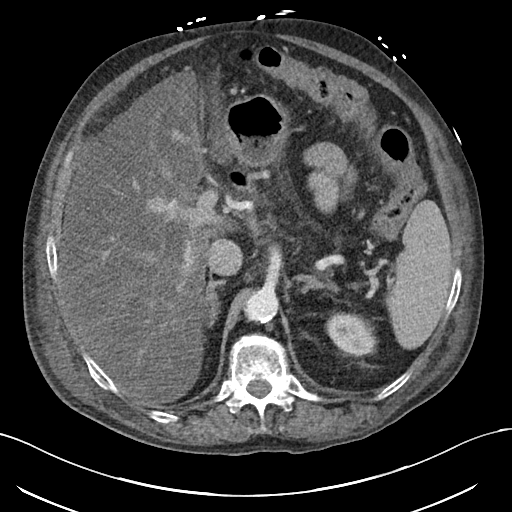
[im 85/127  soft-tissue]
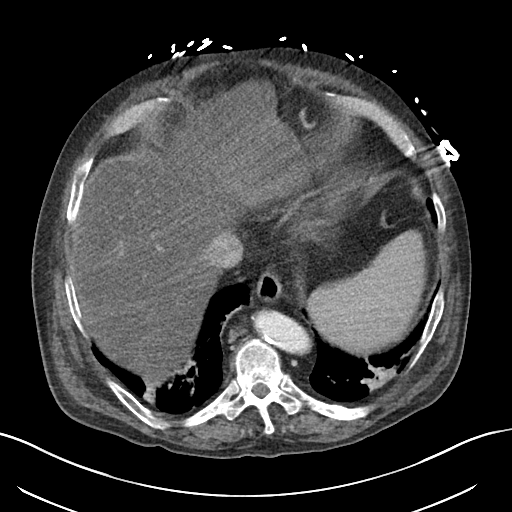
[im 95/127  soft-tissue]
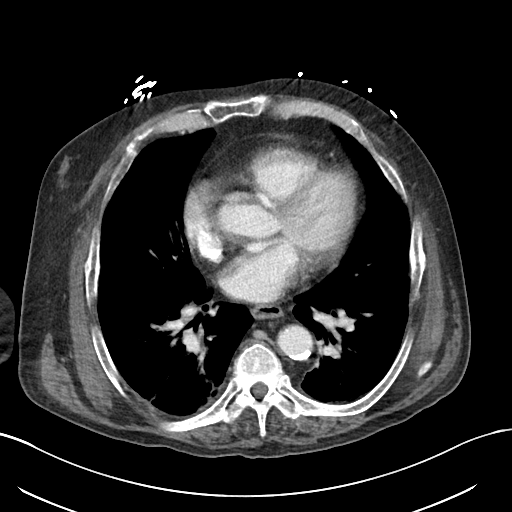
[im 95/127  bone]
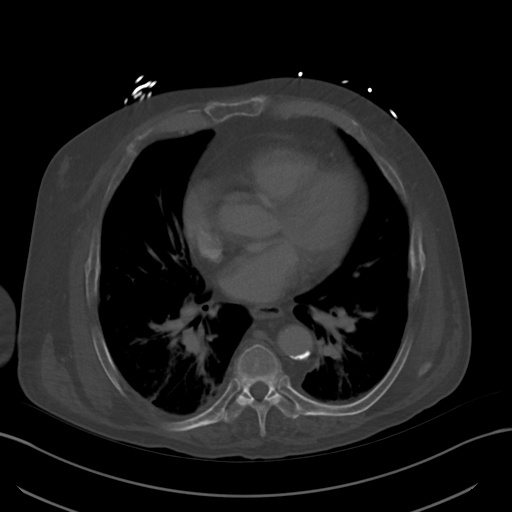
[im 106/127  soft-tissue]
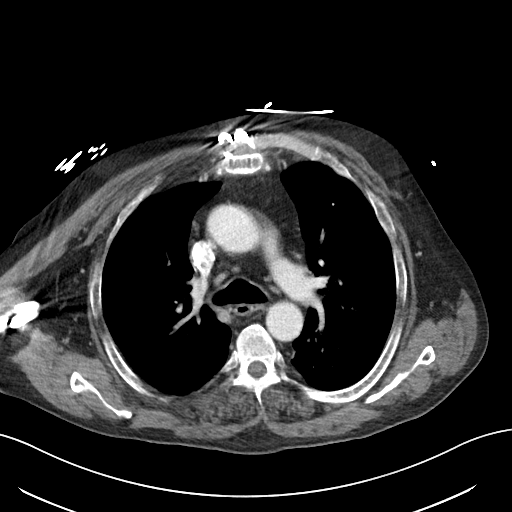
[im 116/127  soft-tissue]
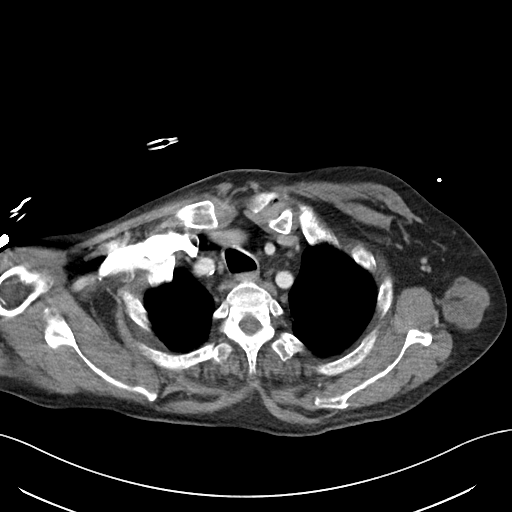

[Series 6: cor · coronal · 0.79mm/px · 3 of 105 slices shown]
[im 35/105  soft-tissue]
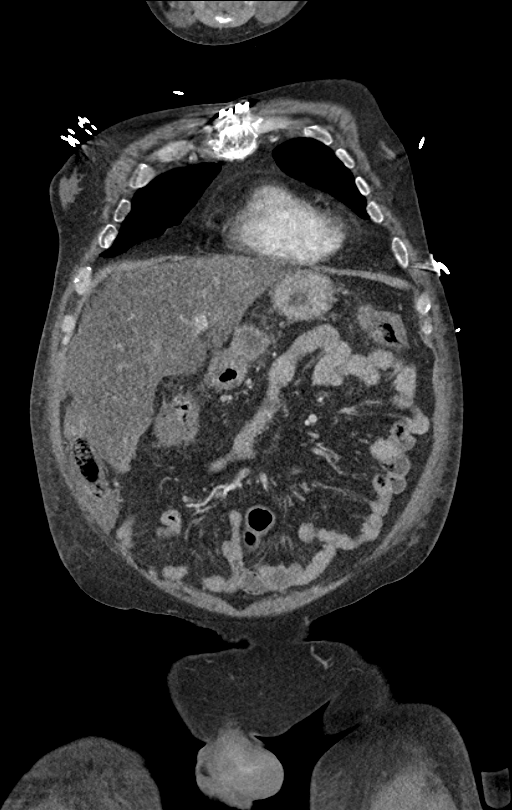
[im 47/105  soft-tissue]
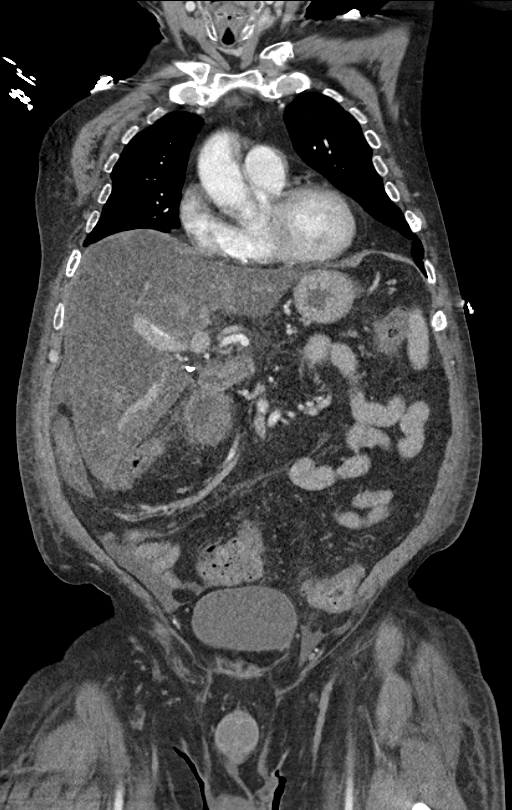
[im 58/105  soft-tissue]
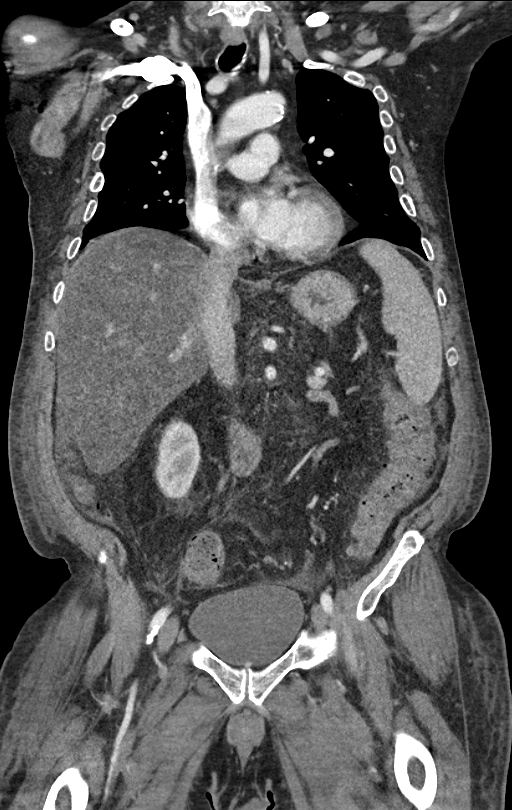

[14 of 46 positions shown; findings below may reference images not displayed]

FINDINGS: CT CHEST FINDINGS

Cardiovascular: Atherosclerosis of the aorta, great vessels and
coronary arteries. No acute vascular findings or evidence of
mediastinal hematoma. The heart size is normal. There is no
pericardial effusion.

Mediastinum/Nodes: There are no enlarged mediastinal, hilar or
axillary lymph nodes. There is a small hiatal hernia. The trachea
and thyroid gland appear unremarkable.

Lungs/Pleura: No pneumothorax or significant pleural effusion. There
are patchy dependent airspace opacities at both lung bases with
associated central airway thickening. There are lesser peribronchial
opacities in the upper and right middle lobes. In the setting of
trauma, some of these could reflect atelectasis, although
inflammation is suspected.

Musculoskeletal/Chest wall: No definite acute osseous findings.
There is an old sternal fracture status post ORIF. This remains
anteriorly displaced. There are multiple rib fractures bilaterally.
There are multiple compression deformities throughout the thoracic
spine. Fractures at T3, T5, T8, T11 and T12 are grossly stable.
Fracture at T10 appears progressive from chest radiographs
08/03/2015. Moderate bilateral gynecomastia.

CT ABDOMEN AND PELVIS FINDINGS

Hepatobiliary: Interval development of severe hepatic steatosis. No
focal hepatic lesions are identified. Previous cholecystectomy
without significant biliary dilatation.

Pancreas: Severe pancreatic atrophy. No focal surrounding
inflammation.

Spleen: Normal in size without focal abnormality.

Adrenals/Urinary Tract: Both adrenal glands appear normal. The
kidneys appear normal without evidence of urinary tract calculus,
suspicious lesion or hydronephrosis. No bladder abnormalities are
seen.

Stomach/Bowel: The stomach and small bowel appear normal. There is
generalized colonic wall thickening without focal surrounding
inflammation. The appendix appears normal. No evidence of bowel
obstruction or perforation.

Vascular/Lymphatic: There are no enlarged abdominal or pelvic lymph
nodes. Diffuse aortic and branch vessel atherosclerosis. No acute
vascular findings. No significant venous findings.

Reproductive: The prostate gland and seminal vesicles appear
unremarkable.

Other: A small amount of ascites is present. There is edema
throughout the intra-abdominal and subcutaneous fat.

Musculoskeletal: Multiple lumbar compression deformities are
present, stable from lumbar MRI 10 months ago. Previous L3 spinal
augmentation. No definite acute osseous findings.
IMPRESSION: 1. No acute clear posttraumatic findings in the chest, abdomen or
pelvis. Multiple fractures are demonstrated, mostly stable. Fracture
at T10 appears progressive from 7954.
2. Patchy bilateral pulmonary opacities, probably due to a
combination of atelectasis and infection/inflammation.
3. Severe hepatic steatosis and severe pancreatic atrophy.
4. Generalized colonic wall thickening may be secondary to
underlying suspected liver disease or reflect diffuse colitis. Mild
ascites with generalized edema throughout the abdominal fat.
5.  Aortic Atherosclerosis ([id number].V).
